# Patient Record
Sex: Female | Born: 1990 | Race: Black or African American | Hispanic: No | Marital: Single | Smoking: Never smoker
Health system: Southern US, Community
[De-identification: ages and names within clinical notes are randomized; demographics above are authoritative.]

## PROBLEM LIST (undated history)

## (undated) ENCOUNTER — Inpatient Hospital Stay (HOSPITAL_COMMUNITY): Payer: Self-pay

## (undated) DIAGNOSIS — G43909 Migraine, unspecified, not intractable, without status migrainosus: Secondary | ICD-10-CM

## (undated) DIAGNOSIS — R112 Nausea with vomiting, unspecified: Secondary | ICD-10-CM

## (undated) DIAGNOSIS — T8859XA Other complications of anesthesia, initial encounter: Secondary | ICD-10-CM

## (undated) DIAGNOSIS — D649 Anemia, unspecified: Secondary | ICD-10-CM

## (undated) DIAGNOSIS — N730 Acute parametritis and pelvic cellulitis: Secondary | ICD-10-CM

## (undated) DIAGNOSIS — Z9889 Other specified postprocedural states: Secondary | ICD-10-CM

## (undated) DIAGNOSIS — A749 Chlamydial infection, unspecified: Secondary | ICD-10-CM

## (undated) DIAGNOSIS — T4145XA Adverse effect of unspecified anesthetic, initial encounter: Secondary | ICD-10-CM

## (undated) DIAGNOSIS — F329 Major depressive disorder, single episode, unspecified: Secondary | ICD-10-CM

## (undated) DIAGNOSIS — L409 Psoriasis, unspecified: Secondary | ICD-10-CM

## (undated) DIAGNOSIS — F32A Depression, unspecified: Secondary | ICD-10-CM

## (undated) DIAGNOSIS — B999 Unspecified infectious disease: Secondary | ICD-10-CM

## (undated) DIAGNOSIS — Z800 Family history of malignant neoplasm of digestive organs: Secondary | ICD-10-CM

## (undated) DIAGNOSIS — N200 Calculus of kidney: Secondary | ICD-10-CM

## (undated) DIAGNOSIS — G8929 Other chronic pain: Secondary | ICD-10-CM

## (undated) DIAGNOSIS — F419 Anxiety disorder, unspecified: Secondary | ICD-10-CM

## (undated) DIAGNOSIS — Z803 Family history of malignant neoplasm of breast: Secondary | ICD-10-CM

## (undated) DIAGNOSIS — R569 Unspecified convulsions: Secondary | ICD-10-CM

## (undated) HISTORY — PX: WISDOM TOOTH EXTRACTION: SHX21

## (undated) HISTORY — DX: Unspecified convulsions: R56.9

## (undated) HISTORY — DX: Family history of malignant neoplasm of digestive organs: Z80.0

## (undated) HISTORY — DX: Major depressive disorder, single episode, unspecified: F32.9

## (undated) HISTORY — DX: Anemia, unspecified: D64.9

## (undated) HISTORY — DX: Depression, unspecified: F32.A

## (undated) HISTORY — PX: SKIN GRAFT: SHX250

## (undated) HISTORY — DX: Family history of malignant neoplasm of breast: Z80.3

---
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
## 2010-08-19 ENCOUNTER — Inpatient Hospital Stay (HOSPITAL_COMMUNITY)
Admission: AD | Admit: 2010-08-19 | Discharge: 2010-08-19 | Disposition: A | Discharge status: Home / Self Care | Payer: Medicaid Other | Source: Ambulatory Visit | Attending: Obstetrics | Admitting: Obstetrics

## 2010-08-19 DIAGNOSIS — O2390 Unspecified genitourinary tract infection in pregnancy, unspecified trimester: Secondary | ICD-10-CM

## 2010-08-19 DIAGNOSIS — R109 Unspecified abdominal pain: Secondary | ICD-10-CM

## 2010-08-19 DIAGNOSIS — N390 Urinary tract infection, site not specified: Secondary | ICD-10-CM

## 2010-08-19 LAB — URINALYSIS, ROUTINE W REFLEX MICROSCOPIC
Bilirubin Urine: NEGATIVE
Hgb urine dipstick: NEGATIVE
Ketones, ur: NEGATIVE mg/dL
Nitrite: NEGATIVE
Protein, ur: NEGATIVE mg/dL
Specific Gravity, Urine: 1.025 (ref 1.005–1.030)
Urine Glucose, Fasting: NEGATIVE mg/dL
Urobilinogen, UA: 0.2 mg/dL (ref 0.0–1.0)
pH: 6.5 (ref 5.0–8.0)

## 2010-08-19 LAB — WET PREP, GENITAL
Trich, Wet Prep: NONE SEEN
Yeast Wet Prep HPF POC: NONE SEEN

## 2010-08-19 LAB — URINE MICROSCOPIC-ADD ON

## 2010-08-20 LAB — URINE CULTURE
Colony Count: NO GROWTH
Culture  Setup Time: 201202090154
Culture: NO GROWTH

## 2010-08-20 LAB — GC/CHLAMYDIA PROBE AMP, GENITAL
Chlamydia, DNA Probe: NEGATIVE
GC Probe Amp, Genital: NEGATIVE

## 2010-10-05 LAB — WET PREP, GENITAL: Yeast Wet Prep HPF POC: NONE SEEN

## 2010-10-05 LAB — URINE MICROSCOPIC-ADD ON

## 2010-10-05 LAB — GC/CHLAMYDIA PROBE AMP, GENITAL
Chlamydia, DNA Probe: NEGATIVE
GC Probe Amp, Genital: NEGATIVE

## 2010-10-05 LAB — URINALYSIS, ROUTINE W REFLEX MICROSCOPIC
Bilirubin Urine: NEGATIVE
Glucose, UA: NEGATIVE mg/dL
Hgb urine dipstick: NEGATIVE
Ketones, ur: NEGATIVE mg/dL
Nitrite: NEGATIVE
Protein, ur: NEGATIVE mg/dL
Specific Gravity, Urine: 1.025 (ref 1.005–1.030)
Urobilinogen, UA: 0.2 mg/dL (ref 0.0–1.0)
pH: 6 (ref 5.0–8.0)

## 2010-10-05 LAB — POCT PREGNANCY, URINE: Preg Test, Ur: NEGATIVE

## 2010-10-19 ENCOUNTER — Inpatient Hospital Stay (HOSPITAL_COMMUNITY)
Admission: AD | Admit: 2010-10-19 | Discharge: 2010-10-19 | Disposition: A | Discharge status: Home / Self Care | Payer: Medicaid Other | Source: Ambulatory Visit | Attending: Obstetrics | Admitting: Obstetrics

## 2010-10-19 DIAGNOSIS — N949 Unspecified condition associated with female genital organs and menstrual cycle: Secondary | ICD-10-CM

## 2010-10-19 DIAGNOSIS — O99891 Other specified diseases and conditions complicating pregnancy: Secondary | ICD-10-CM | POA: Insufficient documentation

## 2010-10-19 DIAGNOSIS — R109 Unspecified abdominal pain: Secondary | ICD-10-CM | POA: Insufficient documentation

## 2010-10-19 LAB — URINALYSIS, ROUTINE W REFLEX MICROSCOPIC
Bilirubin Urine: NEGATIVE
Glucose, UA: NEGATIVE mg/dL
Hgb urine dipstick: NEGATIVE
Ketones, ur: NEGATIVE mg/dL
Nitrite: NEGATIVE
Protein, ur: NEGATIVE mg/dL
Specific Gravity, Urine: 1.02 (ref 1.005–1.030)
Urobilinogen, UA: 0.2 mg/dL (ref 0.0–1.0)
pH: 7.5 (ref 5.0–8.0)

## 2011-06-05 ENCOUNTER — Emergency Department (HOSPITAL_COMMUNITY)
Admission: EM | Admit: 2011-06-05 | Discharge: 2011-06-05 | Disposition: A | Discharge status: Home / Self Care | Payer: Self-pay | Attending: Emergency Medicine | Admitting: Emergency Medicine

## 2011-06-05 ENCOUNTER — Encounter: Payer: Self-pay | Admitting: Nurse Practitioner

## 2011-06-05 DIAGNOSIS — K089 Disorder of teeth and supporting structures, unspecified: Secondary | ICD-10-CM | POA: Insufficient documentation

## 2011-06-05 DIAGNOSIS — K0889 Other specified disorders of teeth and supporting structures: Secondary | ICD-10-CM

## 2011-06-05 DIAGNOSIS — K029 Dental caries, unspecified: Secondary | ICD-10-CM | POA: Insufficient documentation

## 2011-06-05 MED ORDER — PENICILLIN V POTASSIUM 250 MG PO TABS: 250.0000 mg | ORAL_TABLET | Freq: Four times a day (QID) | ORAL | Status: AC

## 2011-06-05 MED ORDER — HYDROCODONE-ACETAMINOPHEN 5-325 MG PO TABS
ORAL_TABLET | ORAL | Status: AC
  Filled 2011-06-05: qty 1

## 2011-06-05 MED ORDER — HYDROCODONE-ACETAMINOPHEN 5-325 MG PO TABS: ORAL_TABLET | ORAL | Status: AC

## 2011-06-05 MED ORDER — HYDROCODONE-ACETAMINOPHEN 5-325 MG PO TABS
1.0000 | ORAL_TABLET | Freq: Once | ORAL | Status: AC
  Administered 2011-06-05: 1 via ORAL

## 2011-06-05 NOTE — ED Notes (Signed)
C/o all 4 wisdom teeth hurting over past weeks but increased in severity 2 days ago.

## 2011-06-05 NOTE — ED Provider Notes (Signed)
History     CSN: [id number] Arrival date & time: 06/05/2011  4:14 PM   Chief Complaint  Patient presents with  . Dental Pain     HPI Pt was seen at 1830.  Per pt, c/o gradual onset and persistence of constant bilat upper and lower teeth "pain" for the past several weeks, worse over the past 2 days.  Denies fevers, no intra-oral edema, no rash, no facial swelling, no dysphagia, no neck pain.   The condition is aggravated by nothing. The condition is relieved by nothing. The symptoms have been associated with no other complaints. The patient has no significant history of serious medical conditions.    History reviewed. No pertinent past medical history.  History reviewed. No pertinent past surgical history.   History  Substance Use Topics  . Smoking status: Never Smoker   . Smokeless tobacco: Not on file  . Alcohol Use: No    Review of Systems ROS: Statement: All systems negative except as marked or noted in the HPI; Constitutional: Negative for fever and chills. ; ; Eyes: Negative for eye pain and discharge. ; ; ENMT: Positive for dental caries, dental hygiene poor and toothache. Negative for ear pain, bleeding gums, dental injury, facial deformity, facial swelling, hoarseness, nasal congestion, sinus pressure, sore throat, throat swelling and tongue swollen. ; ; Cardiovascular: Negative for chest pain, palpitations, diaphoresis, dyspnea and peripheral edema. ; ; Respiratory: Negative for cough, wheezing and stridor. ; ; Gastrointestinal: Negative for nausea, vomiting, diarrhea and abdominal pain. ; ; Genitourinary: Negative for dysuria, flank pain and hematuria. ; ; Musculoskeletal: Negative for back pain and neck pain. ; ; Skin: Negative for rash and skin lesion. ; ; Neuro: Negative for headache, lightheadedness and neck stiffness. ;    Allergies  Review of patient's allergies indicates no known allergies.  Home Medications  No current outpatient prescriptions on file.  BP  123/76  Pulse 83  Temp 97.6 F (36.4 C)  Resp 16  Ht 5\' 6"  (1.676 m)  Wt 210 lb (95.255 kg)  BMI 33.89 kg/m2  SpO2 99%  Physical Exam 1835: Physical examination: Vital signs and O2 SAT: Reviewed; Constitutional: Well developed, Well nourished, Well hydrated, In no acute distress; Head and Face: Normocephalic, Atraumatic; Eyes: EOMI, PERRL, No scleral icterus; ENMT: Mouth and pharynx normal, Poor dentition, Widespread dental decay, Left TM normal, Right TM normal, Mucous membranes moist, +bilat upper and lower wisdom teeth not erupted but gums tender to percuss.  No gingival erythema, edema, fluctuance, or drainage.  No hoarse voice, no drooling, no stridor.  ; Neck: Supple, Full range of motion, No lymphadenopathy; Cardiovascular: Regular rate and rhythm, No murmur, rub, or gallop; Respiratory: Breath sounds clear & equal bilaterally, No rales, rhonchi, wheezes, or rub, Normal respiratory effort/excursion; Chest: Nontender, Movement normal; Extremities: Pulses normal, No tenderness, No edema; Neuro: AA&Ox3, Major CN grossly intact.  No gross focal motor or sensory deficits in extremities.; Skin: Color normal, No rash, No petechiae, Warm, Dry   ED Course  Procedures   MDM  MDM Reviewed: nursing note and vitals        Anyeli Hockenbury Allison Quarry, DO 06/07/11 [phone]

## 2011-12-22 LAB — OB RESULTS CONSOLE GC/CHLAMYDIA
Chlamydia: NEGATIVE
Gonorrhea: NEGATIVE

## 2011-12-22 LAB — OB RESULTS CONSOLE HGB/HCT, BLOOD
HCT: 34 %
Hemoglobin: 11.7 g/dL

## 2012-01-24 LAB — GLUCOSE TOLERANCE, 1 HOUR (50G) W/O FASTING: Glucose, 1 Hour GTT: 74

## 2012-01-24 LAB — OB RESULTS CONSOLE VARICELLA ZOSTER ANTIBODY, IGG: Varicella: IMMUNE

## 2012-01-24 LAB — OB RESULTS CONSOLE RUBELLA ANTIBODY, IGM: Rubella: IMMUNE

## 2012-01-24 LAB — OB RESULTS CONSOLE HEPATITIS B SURFACE ANTIGEN: Hepatitis B Surface Ag: NEGATIVE

## 2012-01-26 LAB — HEMOGLOBINOPATHY EVALUATION: Hemoglobin, Elp: NORMAL

## 2012-02-03 ENCOUNTER — Ambulatory Visit (INDEPENDENT_AMBULATORY_CARE_PROVIDER_SITE_OTHER): Payer: Medicaid Other | Admitting: Obstetrics & Gynecology

## 2012-02-03 ENCOUNTER — Encounter: Payer: Self-pay | Admitting: Obstetrics & Gynecology

## 2012-02-03 VITALS — BP 107/68 | Temp 97.8°F | Wt 230.7 lb

## 2012-02-03 DIAGNOSIS — O26899 Other specified pregnancy related conditions, unspecified trimester: Secondary | ICD-10-CM

## 2012-02-03 DIAGNOSIS — F489 Nonpsychotic mental disorder, unspecified: Secondary | ICD-10-CM

## 2012-02-03 DIAGNOSIS — F53 Postpartum depression: Secondary | ICD-10-CM | POA: Insufficient documentation

## 2012-02-03 DIAGNOSIS — O0990 Supervision of high risk pregnancy, unspecified, unspecified trimester: Secondary | ICD-10-CM | POA: Insufficient documentation

## 2012-02-03 DIAGNOSIS — G40909 Epilepsy, unspecified, not intractable, without status epilepticus: Secondary | ICD-10-CM | POA: Insufficient documentation

## 2012-02-03 DIAGNOSIS — O9989 Other specified diseases and conditions complicating pregnancy, childbirth and the puerperium: Secondary | ICD-10-CM

## 2012-02-03 DIAGNOSIS — Z803 Family history of malignant neoplasm of breast: Secondary | ICD-10-CM | POA: Insufficient documentation

## 2012-02-03 DIAGNOSIS — Z9889 Other specified postprocedural states: Secondary | ICD-10-CM

## 2012-02-03 DIAGNOSIS — O99350 Diseases of the nervous system complicating pregnancy, unspecified trimester: Secondary | ICD-10-CM

## 2012-02-03 DIAGNOSIS — Z98891 History of uterine scar from previous surgery: Secondary | ICD-10-CM

## 2012-02-03 DIAGNOSIS — N898 Other specified noninflammatory disorders of vagina: Secondary | ICD-10-CM

## 2012-02-03 DIAGNOSIS — D649 Anemia, unspecified: Secondary | ICD-10-CM

## 2012-02-03 DIAGNOSIS — G43909 Migraine, unspecified, not intractable, without status migrainosus: Secondary | ICD-10-CM

## 2012-02-03 DIAGNOSIS — O99340 Other mental disorders complicating pregnancy, unspecified trimester: Secondary | ICD-10-CM

## 2012-02-03 DIAGNOSIS — F329 Major depressive disorder, single episode, unspecified: Secondary | ICD-10-CM

## 2012-02-03 LAB — POCT URINALYSIS DIP (DEVICE)
Bilirubin Urine: NEGATIVE
Glucose, UA: NEGATIVE mg/dL
Hgb urine dipstick: NEGATIVE
Ketones, ur: NEGATIVE mg/dL
Leukocytes, UA: NEGATIVE
Nitrite: NEGATIVE
Protein, ur: 30 mg/dL — AB
Specific Gravity, Urine: 1.02 (ref 1.005–1.030)
Urobilinogen, UA: 1 mg/dL (ref 0.0–1.0)
pH: 7 (ref 5.0–8.0)

## 2012-02-03 NOTE — Progress Notes (Signed)
Nutrition Note: (1st visit consult) Pt seen at [facility] for anemia, depression, seizure disorder and obesity. Pt reports good intake of 1-2 meals and several snacks daily. No food allergies. Pt reports vomiting 1 x qd when taking PNV.  Gets a wide variety of foods, limited vegetable intake. Does get caffeine daily. Disc wt gain goals of 11-20#. Current wt gain of 6.7# is within normal limits @ [redacted]w[redacted]d gestation. Pt has a WIC appt today at 3:00 to apply. Pt does plan to breastfeed. Follow up if referred.  Cy Blamer, RD
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
## 2012-02-03 NOTE — Patient Instructions (Addendum)
Pregnancy - Second Trimester The second trimester of pregnancy (3 to 6 months) is a period of rapid growth for you and your baby. At the end of the sixth month, your baby is about 9 inches long and weighs 1 1/2 pounds. You will begin to feel the baby move between 18 and 20 weeks of the pregnancy. This is called quickening. Weight gain is faster. A clear fluid (colostrum) may leak out of your breasts. You may feel small contractions of the womb (uterus). This is known as false labor or Braxton-Hicks contractions. This is like a practice for labor when the baby is ready to be born. Usually, the problems with morning sickness have usually passed by the end of your first trimester. Some women develop small dark blotches (called cholasma, mask of pregnancy) on their face that usually goes away after the baby is born. Exposure to the sun makes the blotches worse. Acne may also develop in some pregnant women and pregnant women who have acne, may find that it goes away. PRENATAL EXAMS  Blood work may continue to be done during prenatal exams. These tests are done to check on your health and the probable health of your baby. Blood work is used to follow your blood levels (hemoglobin). Anemia (low hemoglobin) is common during pregnancy. Iron and vitamins are given to help prevent this. You will also be checked for diabetes between 24 and 28 weeks of the pregnancy. Some of the previous blood tests may be repeated.   The size of the uterus is measured during each visit. This is to make sure that the baby is continuing to grow properly according to the dates of the pregnancy.   Your blood pressure is checked every prenatal visit. This is to make sure you are not getting toxemia.   Your urine is checked to make sure you do not have an infection, diabetes or protein in the urine.   Your weight is checked often to make sure gains are happening at the suggested rate. This is to ensure that both you and your baby are  growing normally.   Sometimes, an ultrasound is performed to confirm the proper growth and development of the baby. This is a test which bounces harmless sound waves off the baby so your caregiver can more accurately determine due dates.  Sometimes, a specialized test is done on the amniotic fluid surrounding the baby. This test is called an amniocentesis. The amniotic fluid is obtained by sticking a needle into the belly (abdomen). This is done to check the chromosomes in instances where there is a concern about possible genetic problems with the baby. It is also sometimes done near the end of pregnancy if an early delivery is required. In this case, it is done to help make sure the baby's lungs are mature enough for the baby to live outside of the womb. CHANGES OCCURING IN THE SECOND TRIMESTER OF PREGNANCY Your body goes through many changes during pregnancy. They vary from person to person. Talk to your caregiver about changes you notice that you are concerned about.  During the second trimester, you will likely have an increase in your appetite. It is normal to have cravings for certain foods. This varies from person to person and pregnancy to pregnancy.   Your lower abdomen will begin to bulge.   You may have to urinate more often because the uterus and baby are pressing on your bladder. It is also common to get more bladder infections during pregnancy (  pain with urination). You can help this by drinking lots of fluids and emptying your bladder before and after intercourse.   You may begin to get stretch marks on your hips, abdomen, and breasts. These are normal changes in the body during pregnancy. There are no exercises or medications to take that prevent this change.   You may begin to develop swollen and bulging veins (varicose veins) in your legs. Wearing support hose, elevating your feet for 15 minutes, 3 to 4 times a day and limiting salt in your diet helps lessen the problem.    Heartburn may develop as the uterus grows and pushes up against the stomach. Antacids recommended by your caregiver helps with this problem. Also, eating smaller meals 4 to 5 times a day helps.   Constipation can be treated with a stool softener or adding bulk to your diet. Drinking lots of fluids, vegetables, fruits, and whole grains are helpful.   Exercising is also helpful. If you have been very active up until your pregnancy, most of these activities can be continued during your pregnancy. If you have been less active, it is helpful to start an exercise program such as walking.   Hemorrhoids (varicose veins in the rectum) may develop at the end of the second trimester. Warm sitz baths and hemorrhoid cream recommended by your caregiver helps hemorrhoid problems.   Backaches may develop during this time of your pregnancy. Avoid heavy lifting, wear low heal shoes and practice good posture to help with backache problems.   Some pregnant women develop tingling and numbness of their hand and fingers because of swelling and tightening of ligaments in the wrist (carpel tunnel syndrome). This goes away after the baby is born.   As your breasts enlarge, you may have to get a bigger bra. Get a comfortable, cotton, support bra. Do not get a nursing bra until the last month of the pregnancy if you will be nursing the baby.   You may get a dark line from your belly button to the pubic area called the linea nigra.   You may develop rosy cheeks because of increase blood flow to the face.   You may develop spider looking lines of the face, neck, arms and chest. These go away after the baby is born.  HOME CARE INSTRUCTIONS   It is extremely important to avoid all smoking, herbs, alcohol, and unprescribed drugs during your pregnancy. These chemicals affect the formation and growth of the baby. Avoid these chemicals throughout the pregnancy to ensure the delivery of a healthy infant.   Most of your home  care instructions are the same as suggested for the first trimester of your pregnancy. Keep your caregiver's appointments. Follow your caregiver's instructions regarding medication use, exercise and diet.   During pregnancy, you are providing food for you and your baby. Continue to eat regular, well-balanced meals. Choose foods such as meat, fish, milk and other low fat dairy products, vegetables, fruits, and whole-grain breads and cereals. Your caregiver will tell you of the ideal weight gain.   A physical sexual relationship may be continued up until near the end of pregnancy if there are no other problems. Problems could include early (premature) leaking of amniotic fluid from the membranes, vaginal bleeding, abdominal pain, or other medical or pregnancy problems.   Exercise regularly if there are no restrictions. Check with your caregiver if you are unsure of the safety of some of your exercises. The greatest weight gain will occur in the   last 2 trimesters of pregnancy. Exercise will help you:   Control your weight.   Get you in shape for labor and delivery.   Lose weight after you have the baby.   Wear a good support or jogging bra for breast tenderness during pregnancy. This may help if worn during sleep. Pads or tissues may be used in the bra if you are leaking colostrum.   Do not use hot tubs, steam rooms or saunas throughout the pregnancy.   Wear your seat belt at all times when driving. This protects you and your baby if you are in an accident.   Avoid raw meat, uncooked cheese, cat litter boxes and soil used by cats. These carry germs that can cause birth defects in the baby.   The second trimester is also a good time to visit your dentist for your dental health if this has not been done yet. Getting your teeth cleaned is OK. Use a soft toothbrush. Brush gently during pregnancy.   It is easier to loose urine during pregnancy. Tightening up and strengthening the pelvic muscles will  help with this problem. Practice stopping your urination while you are going to the bathroom. These are the same muscles you need to strengthen. It is also the muscles you would use as if you were trying to stop from passing gas. You can practice tightening these muscles up 10 times a set and repeating this about 3 times per day. Once you know what muscles to tighten up, do not perform these exercises during urination. It is more likely to contribute to an infection by backing up the urine.   Ask for help if you have financial, counseling or nutritional needs during pregnancy. Your caregiver will be able to offer counseling for these needs as well as refer you for other special needs.   Your skin may become oily. If so, wash your face with mild soap, use non-greasy moisturizer and oil or cream based makeup.  MEDICATIONS AND DRUG USE IN PREGNANCY  Take prenatal vitamins as directed. The vitamin should contain 1 milligram of folic acid. Keep all vitamins out of reach of children. Only a couple vitamins or tablets containing iron may be fatal to a baby or young child when ingested.   Avoid use of all medications, including herbs, over-the-counter medications, not prescribed or suggested by your caregiver. Only take over-the-counter or prescription medicines for pain, discomfort, or fever as directed by your caregiver. Do not use aspirin.   Let your caregiver also know about herbs you may be using.   Alcohol is related to a number of birth defects. This includes fetal alcohol syndrome. All alcohol, in any form, should be avoided completely. Smoking will cause low birth rate and premature babies.   Street or illegal drugs are very harmful to the baby. They are absolutely forbidden. A baby born to an addicted mother will be addicted at birth. The baby will go through the same withdrawal an adult does.  SEEK MEDICAL CARE IF:  You have any concerns or worries during your pregnancy. It is better to call with  your questions if you feel they cannot wait, rather than worry about them. SEEK IMMEDIATE MEDICAL CARE IF:   An unexplained oral temperature above 102 F (38.9 C) develops, or as your caregiver suggests.   You have leaking of fluid from the vagina (birth canal). If leaking membranes are suspected, take your temperature and tell your caregiver of this when you call.   There   is vaginal spotting, bleeding, or passing clots. Tell your caregiver of the amount and how many pads are used. Light spotting in pregnancy is common, especially following intercourse.   You develop a bad smelling vaginal discharge with a change in the color from clear to white.   You continue to feel sick to your stomach (nauseated) and have no relief from remedies suggested. You vomit blood or coffee ground-like materials.   You lose more than 2 pounds of weight or gain more than 2 pounds of weight over 1 week, or as suggested by your caregiver.   You notice swelling of your face, hands, feet, or legs.   You get exposed to [place] measles and have never had them.   You are exposed to fifth disease or chickenpox.   You develop belly (abdominal) pain. Round ligament discomfort is a common non-cancerous (benign) cause of abdominal pain in pregnancy. Your caregiver still must evaluate you.   You develop a bad headache that does not go away.   You develop fever, diarrhea, pain with urination, or shortness of breath.   You develop visual problems, blurry, or double vision.   You fall or are in a car accident or any kind of trauma.   There is mental or physical violence at home.  Document Released: 06/22/2001 Document Revised: 06/17/2011 Document Reviewed: 12/25/2008 [facility] Patient Information 2012 [place], [place]. Postpartum Depression and Baby Blues The postpartum period begins right after the birth of a baby. During this time, there is often a great amount of joy and excitement. It is also a time of considerable  changes in the life of the parent(s). Regardless of how many times a mother gives birth, each child brings new challenges and dynamics to the family. It is not unusual to have feelings of excitement accompanied by confusing shifts in moods, emotions, and thoughts. All mothers are at risk of developing postpartum depression or the "baby blues." These mood changes can occur right after giving birth, or they may occur many months after giving birth. The baby blues or postpartum depression can be mild or severe. Additionally, postpartum depression can resolve rather quickly, or it can be a long-term condition. CAUSES Elevated hormones and their rapid decline are thought to be a main cause of postpartum depression and the baby blues. There are a number of hormones that radically change during and after pregnancy. Estrogen and progesterone usually decrease immediately after delivering your baby. The level of thyroid hormone and various cortisol steroids also rapidly drop. Other factors that play a major role in these changes include major life events and genetics.  RISK FACTORS If you have any of the following risks for the baby blues or postpartum depression, know what symptoms to watch out for during the postpartum period. Risk factors that may increase the likelihood of getting the baby blues or postpartum depression include:  Havinga personal or family history of depression.   Having depression while being pregnant.   Having premenstrual or oral contraceptive-associated mood issues.   Having exceptional life stress.   Having marital conflict.   Lacking a social support network.   Having a baby with special needs.   Having health problems such as diabetes.  SYMPTOMS Baby blues symptoms include:  Brief fluctuations in mood, such as going from extreme happiness to sadness.   Decreased concentration.   Difficulty sleeping.   Crying spells, tearfulness.   Irritability.   Anxiety.    Postpartum depression symptoms typically begin within the first month after  giving birth. These symptoms include:  Difficulty sleeping or excessive sleepiness.   Marked weight loss.   Agitation.   Feelings of worthlessness.   Lack of interest in activity or food.  Postpartum psychosis is a very concerning condition and can be dangerous. Fortunately, it is rare. Displaying any of the following symptoms is cause for immediate medical attention. Postpartum psychosis symptoms include:  Hallucinations and delusions.   Bizarre or disorganized behavior.   Confusion or disorientation.  DIAGNOSIS  A diagnosis is made by an evaluation of your symptoms. There are no medical or lab tests that lead to a diagnosis, but there are various questionnaires that a caregiver may use to identify those with the baby blues, postpartum depression, or psychosis. Often times, a screening tool called the [place] Postnatal Depression Scale is used to diagnose depression in the postpartum period.  TREATMENT The baby blues usually goes away on its own in 1 to 2 weeks. Social support is often all that is needed. You should be encouraged to get adequate sleep and rest. Occasionally, you may be given medicines to help you sleep.  Postpartum depression requires treatment as it can last several months or longer if it is not treated. Treatment may include individual or group therapy, medicine, or both to address any social, physiological, and psychological factors that may play a role in the depression. Regular exercise, a healthy diet, rest, and social support may also be strongly recommended.  Postpartum psychosis is more serious and needs treatment right away. Hospitalization is often needed. HOME CARE INSTRUCTIONS  Get as much rest as you can. Nap when the baby sleeps.   Exercise regularly. Some women find yoga and walking to be beneficial.   Eat a balanced and nourishing diet.   Do little things that you enjoy.  Have a cup of tea, take a bubble bath, read your favorite magazine, or listen to your favorite music.   Avoid alcohol.   Ask for help with household chores, cooking, grocery shopping, or running errands as needed. Do not try to do everything.   Talk to people close to you about how you are feeling. Get support from your partner, family members, friends, or other new moms.   Try to stay positive in how you think. Think about the things you are grateful for.   Do not spend a lot of time alone.   Only take medicine as directed by your caregiver.   Keep all your postpartum appointments.   Let your caregiver know if you have any concerns.  SEEK MEDICAL CARE IF: You are having a reaction or problems with your medicine. SEEK IMMEDIATE MEDICAL CARE IF:  You have suicidal feelings.   You feel you may harm the baby or someone else.  Document Released: 04/01/2004 Document Revised: 06/17/2011 Document Reviewed: 05/04/2011 [facility] Patient Information 2012 [place], [place]. Breastfeeding BENEFITS OF BREASTFEEDING For the baby  The first milk (colostrum) helps the baby's digestive system function better.   There are antibodies from the mother in the milk that help the baby fight off infections.   The baby has a lower incidence of asthma, allergies, and SIDS (sudden infant death syndrome).   The nutrients in breast milk are better than formulas for the baby and helps the baby's brain grow better.   Babies who breastfeed have less gas, colic, and constipation.  For the mother  Breastfeeding helps develop a very special bond between mother and baby.   It is more convenient, always available at the  correct temperature and cheaper than formula feeding.   It burns calories in the mother and helps with losing weight that was gained during pregnancy.   It makes the uterus contract back down to normal size faster and slows bleeding following delivery.   Breastfeeding mothers have a lower  risk of developing breast cancer.  NURSE FREQUENTLY  A healthy, full-term baby may breastfeed as often as every hour or space his or her feedings to every 3 hours.   How often to nurse will vary from baby to baby. Watch your baby for signs of hunger, not the clock.   Nurse as often as the baby requests, or when you feel the need to reduce the fullness of your breasts.   Awaken the baby if it has been 3 to 4 hours since the last feeding.   Frequent feeding will help the mother make more milk and will prevent problems like sore nipples and engorgement of the breasts.  BABY'S POSITION AT THE BREAST  Whether lying down or sitting, be sure that the baby's tummy is facing your tummy.   Support the breast with 4 fingers underneath the breast and the thumb above. Make sure your fingers are well away from the nipple and baby's mouth.   Stroke the baby's lips and cheek closest to the breast gently with your finger or nipple.   When the baby's mouth is open wide enough, place all of your nipple and as much of the dark area around the nipple as possible into your baby's mouth.   Pull the baby in close so the tip of the nose and the baby's cheeks touch the breast during the feeding.  FEEDINGS  The length of each feeding varies from baby to baby and from feeding to feeding.   The baby must suck about 2 to 3 minutes for your milk to get to him or her. This is called a "let down." For this reason, allow the baby to feed on each breast as long as he or she wants. Your baby will end the feeding when he or she has received the right balance of nutrients.   To break the suction, put your finger into the corner of the baby's mouth and slide it between his or her gums before removing your breast from his or her mouth. This will help prevent sore nipples.  REDUCING BREAST ENGORGEMENT  In the first week after your baby is born, you may experience signs of breast engorgement. When breasts are engorged, they  feel heavy, warm, full, and may be tender to the touch. You can reduce engorgement if you:   Nurse frequently, every 2 to 3 hours. Mothers who breastfeed early and often have fewer problems with engorgement.   Place light ice packs on your breasts between feedings. This reduces swelling. Wrap the ice packs in a lightweight towel to protect your skin.   Apply moist hot packs to your breast for 5 to 10 minutes before each feeding. This increases circulation and helps the milk flow.   Gently massage your breast before and during the feeding.   Make sure that the baby empties at least one breast at every feeding before switching sides.   Use a breast pump to empty the breasts if your baby is sleepy or not nursing well. You may also want to pump if you are returning to work or or you feel you are getting engorged.   Avoid bottle feeds, pacifiers or supplemental feedings of water or  juice in place of breastfeeding.   Be sure the baby is latched on and positioned properly while breastfeeding.   Prevent fatigue, stress, and anemia.   Wear a supportive bra, avoiding underwire styles.   Eat a balanced diet with enough fluids.  If you follow these suggestions, your engorgement should improve in 24 to 48 hours. If you are still experiencing difficulty, call your lactation consultant or caregiver. IS MY BABY GETTING ENOUGH MILK? Sometimes, mothers worry about whether their babies are getting enough milk. You can be assured that your baby is getting enough milk if:  The baby is actively sucking and you hear swallowing.   The baby nurses at least 8 to 12 times in a 24 hour time period. Nurse your baby until he or she unlatches or falls asleep at the first breast (at least 10 to 20 minutes), then offer the second side.   The baby is wetting 5 to 6 disposable diapers (6 to 8 cloth diapers) in a 24 hour period by 54 to 81 days of age.   The baby is having at least 2 to 3 stools every 24 hours for the  first few months. Breast milk is all the food your baby needs. It is not necessary for your baby to have water or formula. In fact, to help your breasts make more milk, it is best not to give your baby supplemental feedings during the early weeks.   The stool should be soft and yellow.   The baby should gain 4 to 7 ounces per week after he is 75 days old.  TAKE CARE OF YOURSELF Take care of your breasts by:  Bathing or showering daily.   Avoiding the use of soaps on your nipples.   Start feedings on your left breast at one feeding and on your right breast at the next feeding.   You will notice an increase in your milk supply 2 to 5 days after delivery. You may feel some discomfort from engorgement, which makes your breasts very firm and often tender. Engorgement "peaks" out within 24 to 48 hours. In the meantime, apply warm moist towels to your breasts for 5 to 10 minutes before feeding. Gentle massage and expression of some milk before feeding will soften your breasts, making it easier for your baby to latch on. Wear a well fitting nursing bra and air dry your nipples for 10 to 15 minutes after each feeding.   Only use cotton bra pads.   Only use pure lanolin on your nipples after nursing. You do not need to wash it off before nursing.  Take care of yourself by:   Eating well-balanced meals and nutritious snacks.   Drinking milk, fruit juice, and water to satisfy your thirst (about 8 glasses a day).   Getting plenty of rest.   Increasing calcium in your diet (1200 mg a day).   Avoiding foods that you notice affect the baby in a bad way.  SEEK MEDICAL CARE IF:   You have any questions or difficulty with breastfeeding.   You need help.   You have a hard, red, sore area on your breast, accompanied by a fever of 100.5 F (38.1 C) or more.   Your baby is too sleepy to eat well or is having trouble sleeping.   Your baby is wetting less than 6 diapers per day, by 72 days of age.    Your baby's skin or white part of his or her eyes is more yellow  than it was in the hospital.   You feel depressed.  Document Released: 06/28/2005 Document Revised: 06/17/2011 Document Reviewed: 02/10/2009 [facility] Patient Information 2012 [place], [place]. Pregnancy - Second Trimester The second trimester of pregnancy (3 to 6 months) is a period of rapid growth for you and your baby. At the end of the sixth month, your baby is about 9 inches long and weighs 1 1/2 pounds. You will begin to feel the baby move between 18 and 20 weeks of the pregnancy. This is called quickening. Weight gain is faster. A clear fluid (colostrum) may leak out of your breasts. You may feel small contractions of the womb (uterus). This is known as false labor or Braxton-Hicks contractions. This is like a practice for labor when the baby is ready to be born. Usually, the problems with morning sickness have usually passed by the end of your first trimester. Some women develop small dark blotches (called cholasma, mask of pregnancy) on their face that usually goes away after the baby is born. Exposure to the sun makes the blotches worse. Acne may also develop in some pregnant women and pregnant women who have acne, may find that it goes away. PRENATAL EXAMS  Blood work may continue to be done during prenatal exams. These tests are done to check on your health and the probable health of your baby. Blood work is used to follow your blood levels (hemoglobin). Anemia (low hemoglobin) is common during pregnancy. Iron and vitamins are given to help prevent this. You will also be checked for diabetes between 24 and 28 weeks of the pregnancy. Some of the previous blood tests may be repeated.   The size of the uterus is measured during each visit. This is to make sure that the baby is continuing to grow properly according to the dates of the pregnancy.   Your blood pressure is checked every prenatal visit. This is to make sure you are  not getting toxemia.   Your urine is checked to make sure you do not have an infection, diabetes or protein in the urine.   Your weight is checked often to make sure gains are happening at the suggested rate. This is to ensure that both you and your baby are growing normally.   Sometimes, an ultrasound is performed to confirm the proper growth and development of the baby. This is a test which bounces harmless sound waves off the baby so your caregiver can more accurately determine due dates.  Sometimes, a specialized test is done on the amniotic fluid surrounding the baby. This test is called an amniocentesis. The amniotic fluid is obtained by sticking a needle into the belly (abdomen). This is done to check the chromosomes in instances where there is a concern about possible genetic problems with the baby. It is also sometimes done near the end of pregnancy if an early delivery is required. In this case, it is done to help make sure the baby's lungs are mature enough for the baby to live outside of the womb. CHANGES OCCURING IN THE SECOND TRIMESTER OF PREGNANCY Your body goes through many changes during pregnancy. They vary from person to person. Talk to your caregiver about changes you notice that you are concerned about.  During the second trimester, you will likely have an increase in your appetite. It is normal to have cravings for certain foods. This varies from person to person and pregnancy to pregnancy.   Your lower abdomen will begin to bulge.   You  may have to urinate more often because the uterus and baby are pressing on your bladder. It is also common to get more bladder infections during pregnancy (pain with urination). You can help this by drinking lots of fluids and emptying your bladder before and after intercourse.   You may begin to get stretch marks on your hips, abdomen, and breasts. These are normal changes in the body during pregnancy. There are no exercises or medications to  take that prevent this change.   You may begin to develop swollen and bulging veins (varicose veins) in your legs. Wearing support hose, elevating your feet for 15 minutes, 3 to 4 times a day and limiting salt in your diet helps lessen the problem.   Heartburn may develop as the uterus grows and pushes up against the stomach. Antacids recommended by your caregiver helps with this problem. Also, eating smaller meals 4 to 5 times a day helps.   Constipation can be treated with a stool softener or adding bulk to your diet. Drinking lots of fluids, vegetables, fruits, and whole grains are helpful.   Exercising is also helpful. If you have been very active up until your pregnancy, most of these activities can be continued during your pregnancy. If you have been less active, it is helpful to start an exercise program such as walking.   Hemorrhoids (varicose veins in the rectum) may develop at the end of the second trimester. Warm sitz baths and hemorrhoid cream recommended by your caregiver helps hemorrhoid problems.   Backaches may develop during this time of your pregnancy. Avoid heavy lifting, wear low heal shoes and practice good posture to help with backache problems.   Some pregnant women develop tingling and numbness of their hand and fingers because of swelling and tightening of ligaments in the wrist (carpel tunnel syndrome). This goes away after the baby is born.   As your breasts enlarge, you may have to get a bigger bra. Get a comfortable, cotton, support bra. Do not get a nursing bra until the last month of the pregnancy if you will be nursing the baby.   You may get a dark line from your belly button to the pubic area called the linea nigra.   You may develop rosy cheeks because of increase blood flow to the face.   You may develop spider looking lines of the face, neck, arms and chest. These go away after the baby is born.  HOME CARE INSTRUCTIONS   It is extremely important to avoid  all smoking, herbs, alcohol, and unprescribed drugs during your pregnancy. These chemicals affect the formation and growth of the baby. Avoid these chemicals throughout the pregnancy to ensure the delivery of a healthy infant.   Most of your home care instructions are the same as suggested for the first trimester of your pregnancy. Keep your caregiver's appointments. Follow your caregiver's instructions regarding medication use, exercise and diet.   During pregnancy, you are providing food for you and your baby. Continue to eat regular, well-balanced meals. Choose foods such as meat, fish, milk and other low fat dairy products, vegetables, fruits, and whole-grain breads and cereals. Your caregiver will tell you of the ideal weight gain.   A physical sexual relationship may be continued up until near the end of pregnancy if there are no other problems. Problems could include early (premature) leaking of amniotic fluid from the membranes, vaginal bleeding, abdominal pain, or other medical or pregnancy problems.   Exercise regularly if there  are no restrictions. Check with your caregiver if you are unsure of the safety of some of your exercises. The greatest weight gain will occur in the last 2 trimesters of pregnancy. Exercise will help you:   Control your weight.   Get you in shape for labor and delivery.   Lose weight after you have the baby.   Wear a good support or jogging bra for breast tenderness during pregnancy. This may help if worn during sleep. Pads or tissues may be used in the bra if you are leaking colostrum.   Do not use hot tubs, steam rooms or saunas throughout the pregnancy.   Wear your seat belt at all times when driving. This protects you and your baby if you are in an accident.   Avoid raw meat, uncooked cheese, cat litter boxes and soil used by cats. These carry germs that can cause birth defects in the baby.   The second trimester is also a good time to visit your dentist  for your dental health if this has not been done yet. Getting your teeth cleaned is OK. Use a soft toothbrush. Brush gently during pregnancy.   It is easier to loose urine during pregnancy. Tightening up and strengthening the pelvic muscles will help with this problem. Practice stopping your urination while you are going to the bathroom. These are the same muscles you need to strengthen. It is also the muscles you would use as if you were trying to stop from passing gas. You can practice tightening these muscles up 10 times a set and repeating this about 3 times per day. Once you know what muscles to tighten up, do not perform these exercises during urination. It is more likely to contribute to an infection by backing up the urine.   Ask for help if you have financial, counseling or nutritional needs during pregnancy. Your caregiver will be able to offer counseling for these needs as well as refer you for other special needs.   Your skin may become oily. If so, wash your face with mild soap, use non-greasy moisturizer and oil or cream based makeup.  MEDICATIONS AND DRUG USE IN PREGNANCY  Take prenatal vitamins as directed. The vitamin should contain 1 milligram of folic acid. Keep all vitamins out of reach of children. Only a couple vitamins or tablets containing iron may be fatal to a baby or young child when ingested.   Avoid use of all medications, including herbs, over-the-counter medications, not prescribed or suggested by your caregiver. Only take over-the-counter or prescription medicines for pain, discomfort, or fever as directed by your caregiver. Do not use aspirin.   Let your caregiver also know about herbs you may be using.   Alcohol is related to a number of birth defects. This includes fetal alcohol syndrome. All alcohol, in any form, should be avoided completely. Smoking will cause low birth rate and premature babies.   Street or illegal drugs are very harmful to the baby. They are  absolutely forbidden. A baby born to an addicted mother will be addicted at birth. The baby will go through the same withdrawal an adult does.  SEEK MEDICAL CARE IF:  You have any concerns or worries during your pregnancy. It is better to call with your questions if you feel they cannot wait, rather than worry about them. SEEK IMMEDIATE MEDICAL CARE IF:   An unexplained oral temperature above 102 F (38.9 C) develops, or as your caregiver suggests.   You have leaking  of fluid from the vagina (birth canal). If leaking membranes are suspected, take your temperature and tell your caregiver of this when you call.   There is vaginal spotting, bleeding, or passing clots. Tell your caregiver of the amount and how many pads are used. Light spotting in pregnancy is common, especially following intercourse.   You develop a bad smelling vaginal discharge with a change in the color from clear to white.   You continue to feel sick to your stomach (nauseated) and have no relief from remedies suggested. You vomit blood or coffee ground-like materials.   You lose more than 2 pounds of weight or gain more than 2 pounds of weight over 1 week, or as suggested by your caregiver.   You notice swelling of your face, hands, feet, or legs.   You get exposed to [place] measles and have never had them.   You are exposed to fifth disease or chickenpox.   You develop belly (abdominal) pain. Round ligament discomfort is a common non-cancerous (benign) cause of abdominal pain in pregnancy. Your caregiver still must evaluate you.   You develop a bad headache that does not go away.   You develop fever, diarrhea, pain with urination, or shortness of breath.   You develop visual problems, blurry, or double vision.   You fall or are in a car accident or any kind of trauma.   There is mental or physical violence at home.  Document Released: 06/22/2001 Document Revised: 06/17/2011 Document Reviewed:  12/25/2008 [facility] Patient Information 2012 [place], [place]. Contraception Choices Contraception (birth control) is the use of any methods or devices to prevent pregnancy. Below are some methods to help avoid pregnancy. HORMONAL METHODS   Contraceptive implant. This is a thin, plastic tube containing progesterone hormone. It does not contain estrogen hormone. Your caregiver inserts the tube in the inner part of the upper arm. The tube can remain in place for up to 3 years. After 3 years, the implant must be removed. The implant prevents the ovaries from releasing an egg (ovulation), thickens the cervical mucus which prevents sperm from entering the uterus, and thins the lining of the inside of the uterus.   Progesterone-only injections. These injections are given every 3 months by your caregiver to prevent pregnancy. This synthetic progesterone hormone stops the ovaries from releasing eggs. It also thickens cervical mucus and changes the uterine lining. This makes it harder for sperm to survive in the uterus.   Birth control pills. These pills contain estrogen and progesterone hormone. They work by stopping the egg from forming in the ovary (ovulation). Birth control pills are prescribed by a caregiver.Birth control pills can also be used to treat heavy periods.   Minipill. This type of birth control pill contains only the progesterone hormone. They are taken every day of each month and must be prescribed by your caregiver.   Birth control patch. The patch contains hormones similar to those in birth control pills. It must be changed once a week and is prescribed by a caregiver.   Vaginal ring. The ring contains hormones similar to those in birth control pills. It is left in the vagina for 3 weeks, removed for 1 week, and then a new one is put back in place. The patient must be comfortable inserting and removing the ring from the vagina.A caregiver's prescription is necessary.   Emergency  contraception. Emergency contraceptives prevent pregnancy after unprotected sexual intercourse. This pill can be taken right after sex or up to  5 days after unprotected sex. It is most effective the sooner you take the pills after having sexual intercourse. Emergency contraceptive pills are available without a prescription. Check with your pharmacist. Do not use emergency contraception as your only form of birth control.  BARRIER METHODS   Female condom. This is a thin sheath (latex or rubber) that is worn over the penis during sexual intercourse. It can be used with spermicide to increase effectiveness.   Female condom. This is a soft, loose-fitting sheath that is put into the vagina before sexual intercourse.   Diaphragm. This is a soft, latex, dome-shaped barrier that must be fitted by a caregiver. It is inserted into the vagina, along with a spermicidal jelly. It is inserted before intercourse. The diaphragm should be left in the vagina for 6 to 8 hours after intercourse.   Cervical cap. This is a round, soft, latex or plastic cup that fits over the cervix and must be fitted by a caregiver. The cap can be left in place for up to 48 hours after intercourse.   Sponge. This is a soft, circular piece of polyurethane foam. The sponge has spermicide in it. It is inserted into the vagina after wetting it and before sexual intercourse.   Spermicides. These are chemicals that kill or block sperm from entering the cervix and uterus. They come in the form of creams, jellies, suppositories, foam, or tablets. They do not require a prescription. They are inserted into the vagina with an applicator before having sexual intercourse. The process must be repeated every time you have sexual intercourse.  INTRAUTERINE CONTRACEPTION  Intrauterine device (IUD). This is a T-shaped device that is put in a woman's uterus during a menstrual period to prevent pregnancy. There are 2 types:   Copper IUD. This type of IUD is  wrapped in copper wire and is placed inside the uterus. Copper makes the uterus and fallopian tubes produce a fluid that kills sperm. It can stay in place for 10 years.   Hormone IUD. This type of IUD contains the hormone progestin (synthetic progesterone). The hormone thickens the cervical mucus and prevents sperm from entering the uterus, and it also thins the uterine lining to prevent implantation of a fertilized egg. The hormone can weaken or kill the sperm that get into the uterus. It can stay in place for 5 years.  PERMANENT METHODS OF CONTRACEPTION  Female tubal ligation. This is when the woman's fallopian tubes are surgically sealed, tied, or blocked to prevent the egg from traveling to the uterus.   Female sterilization. This is when the female has the tubes that carry sperm tied off (vasectomy).This blocks sperm from entering the vagina during sexual intercourse. After the procedure, the man can still ejaculate fluid (semen).  NATURAL PLANNING METHODS  Natural family planning. This is not having sexual intercourse or using a barrier method (condom, diaphragm, cervical cap) on days the woman could become pregnant.   Calendar method. This is keeping track of the length of each menstrual cycle and identifying when you are fertile.   Ovulation method. This is avoiding sexual intercourse during ovulation.   Symptothermal method. This is avoiding sexual intercourse during ovulation, using a thermometer and ovulation symptoms.   Post-ovulation method. This is timing sexual intercourse after you have ovulated.  Regardless of which type or method of contraception you choose, it is important that you use condoms to protect against the transmission of sexually transmitted diseases (STDs). Talk with your caregiver about which form  of contraception is most appropriate for you. Document Released: 06/28/2005 Document Revised: 06/17/2011 Document Reviewed: 11/04/2010 [facility] Patient Information 2012  [place], [place]. Postpartum Depression and Baby Blues The postpartum period begins right after the birth of a baby. During this time, there is often a great amount of joy and excitement. It is also a time of considerable changes in the life of the parent(s). Regardless of how many times a mother gives birth, each child brings new challenges and dynamics to the family. It is not unusual to have feelings of excitement accompanied by confusing shifts in moods, emotions, and thoughts. All mothers are at risk of developing postpartum depression or the "baby blues." These mood changes can occur right after giving birth, or they may occur many months after giving birth. The baby blues or postpartum depression can be mild or severe. Additionally, postpartum depression can resolve rather quickly, or it can be a long-term condition. CAUSES Elevated hormones and their rapid decline are thought to be a main cause of postpartum depression and the baby blues. There are a number of hormones that radically change during and after pregnancy. Estrogen and progesterone usually decrease immediately after delivering your baby. The level of thyroid hormone and various cortisol steroids also rapidly drop. Other factors that play a major role in these changes include major life events and genetics.  RISK FACTORS If you have any of the following risks for the baby blues or postpartum depression, know what symptoms to watch out for during the postpartum period. Risk factors that may increase the likelihood of getting the baby blues or postpartum depression include:  Havinga personal or family history of depression.   Having depression while being pregnant.   Having premenstrual or oral contraceptive-associated mood issues.   Having exceptional life stress.   Having marital conflict.   Lacking a social support network.   Having a baby with special needs.   Having health problems such as diabetes.  SYMPTOMS Baby blues  symptoms include:  Brief fluctuations in mood, such as going from extreme happiness to sadness.   Decreased concentration.   Difficulty sleeping.   Crying spells, tearfulness.   Irritability.   Anxiety.  Postpartum depression symptoms typically begin within the first month after giving birth. These symptoms include:  Difficulty sleeping or excessive sleepiness.   Marked weight loss.   Agitation.   Feelings of worthlessness.   Lack of interest in activity or food.  Postpartum psychosis is a very concerning condition and can be dangerous. Fortunately, it is rare. Displaying any of the following symptoms is cause for immediate medical attention. Postpartum psychosis symptoms include:  Hallucinations and delusions.   Bizarre or disorganized behavior.   Confusion or disorientation.  DIAGNOSIS  A diagnosis is made by an evaluation of your symptoms. There are no medical or lab tests that lead to a diagnosis, but there are various questionnaires that a caregiver may use to identify those with the baby blues, postpartum depression, or psychosis. Often times, a screening tool called the [place] Postnatal Depression Scale is used to diagnose depression in the postpartum period.  TREATMENT The baby blues usually goes away on its own in 1 to 2 weeks. Social support is often all that is needed. You should be encouraged to get adequate sleep and rest. Occasionally, you may be given medicines to help you sleep.  Postpartum depression requires treatment as it can last several months or longer if it is not treated. Treatment may include individual or group therapy, medicine,  or both to address any social, physiological, and psychological factors that may play a role in the depression. Regular exercise, a healthy diet, rest, and social support may also be strongly recommended.  Postpartum psychosis is more serious and needs treatment right away. Hospitalization is often needed. HOME CARE  INSTRUCTIONS  Get as much rest as you can. Nap when the baby sleeps.   Exercise regularly. Some women find yoga and walking to be beneficial.   Eat a balanced and nourishing diet.   Do little things that you enjoy. Have a cup of tea, take a bubble bath, read your favorite magazine, or listen to your favorite music.   Avoid alcohol.   Ask for help with household chores, cooking, grocery shopping, or running errands as needed. Do not try to do everything.   Talk to people close to you about how you are feeling. Get support from your partner, family members, friends, or other new moms.   Try to stay positive in how you think. Think about the things you are grateful for.   Do not spend a lot of time alone.   Only take medicine as directed by your caregiver.   Keep all your postpartum appointments.   Let your caregiver know if you have any concerns.  SEEK MEDICAL CARE IF: You are having a reaction or problems with your medicine. SEEK IMMEDIATE MEDICAL CARE IF:  You have suicidal feelings.   You feel you may harm the baby or someone else.  Document Released: 04/01/2004 Document Revised: 06/17/2011 Document Reviewed: 05/04/2011 [facility] Patient Information 2012 [place], [place]. Breastfeeding BENEFITS OF BREASTFEEDING For the baby  The first milk (colostrum) helps the baby's digestive system function better.   There are antibodies from the mother in the milk that help the baby fight off infections.   The baby has a lower incidence of asthma, allergies, and SIDS (sudden infant death syndrome).   The nutrients in breast milk are better than formulas for the baby and helps the baby's brain grow better.   Babies who breastfeed have less gas, colic, and constipation.  For the mother  Breastfeeding helps develop a very special bond between mother and baby.   It is more convenient, always available at the correct temperature and cheaper than formula feeding.   It burns calories  in the mother and helps with losing weight that was gained during pregnancy.   It makes the uterus contract back down to normal size faster and slows bleeding following delivery.   Breastfeeding mothers have a lower risk of developing breast cancer.  NURSE FREQUENTLY  A healthy, full-term baby may breastfeed as often as every hour or space his or her feedings to every 3 hours.   How often to nurse will vary from baby to baby. Watch your baby for signs of hunger, not the clock.   Nurse as often as the baby requests, or when you feel the need to reduce the fullness of your breasts.   Awaken the baby if it has been 3 to 4 hours since the last feeding.   Frequent feeding will help the mother make more milk and will prevent problems like sore nipples and engorgement of the breasts.  BABY'S POSITION AT THE BREAST  Whether lying down or sitting, be sure that the baby's tummy is facing your tummy.   Support the breast with 4 fingers underneath the breast and the thumb above. Make sure your fingers are well away from the nipple and baby's mouth.  Stroke the baby's lips and cheek closest to the breast gently with your finger or nipple.   When the baby's mouth is open wide enough, place all of your nipple and as much of the dark area around the nipple as possible into your baby's mouth.   Pull the baby in close so the tip of the nose and the baby's cheeks touch the breast during the feeding.  FEEDINGS  The length of each feeding varies from baby to baby and from feeding to feeding.   The baby must suck about 2 to 3 minutes for your milk to get to him or her. This is called a "let down." For this reason, allow the baby to feed on each breast as long as he or she wants. Your baby will end the feeding when he or she has received the right balance of nutrients.   To break the suction, put your finger into the corner of the baby's mouth and slide it between his or her gums before removing your  breast from his or her mouth. This will help prevent sore nipples.  REDUCING BREAST ENGORGEMENT  In the first week after your baby is born, you may experience signs of breast engorgement. When breasts are engorged, they feel heavy, warm, full, and may be tender to the touch. You can reduce engorgement if you:   Nurse frequently, every 2 to 3 hours. Mothers who breastfeed early and often have fewer problems with engorgement.   Place light ice packs on your breasts between feedings. This reduces swelling. Wrap the ice packs in a lightweight towel to protect your skin.   Apply moist hot packs to your breast for 5 to 10 minutes before each feeding. This increases circulation and helps the milk flow.   Gently massage your breast before and during the feeding.   Make sure that the baby empties at least one breast at every feeding before switching sides.   Use a breast pump to empty the breasts if your baby is sleepy or not nursing well. You may also want to pump if you are returning to work or or you feel you are getting engorged.   Avoid bottle feeds, pacifiers or supplemental feedings of water or juice in place of breastfeeding.   Be sure the baby is latched on and positioned properly while breastfeeding.   Prevent fatigue, stress, and anemia.   Wear a supportive bra, avoiding underwire styles.   Eat a balanced diet with enough fluids.  If you follow these suggestions, your engorgement should improve in 24 to 48 hours. If you are still experiencing difficulty, call your lactation consultant or caregiver. IS MY BABY GETTING ENOUGH MILK? Sometimes, mothers worry about whether their babies are getting enough milk. You can be assured that your baby is getting enough milk if:  The baby is actively sucking and you hear swallowing.   The baby nurses at least 8 to 12 times in a 24 hour time period. Nurse your baby until he or she unlatches or falls asleep at the first breast (at least 10 to 20  minutes), then offer the second side.   The baby is wetting 5 to 6 disposable diapers (6 to 8 cloth diapers) in a 24 hour period by 59 to 26 days of age.   The baby is having at least 2 to 3 stools every 24 hours for the first few months. Breast milk is all the food your baby needs. It is not necessary for your  baby to have water or formula. In fact, to help your breasts make more milk, it is best not to give your baby supplemental feedings during the early weeks.   The stool should be soft and yellow.   The baby should gain 4 to 7 ounces per week after he is 64 days old.  TAKE CARE OF YOURSELF Take care of your breasts by:  Bathing or showering daily.   Avoiding the use of soaps on your nipples.   Start feedings on your left breast at one feeding and on your right breast at the next feeding.   You will notice an increase in your milk supply 2 to 5 days after delivery. You may feel some discomfort from engorgement, which makes your breasts very firm and often tender. Engorgement "peaks" out within 24 to 48 hours. In the meantime, apply warm moist towels to your breasts for 5 to 10 minutes before feeding. Gentle massage and expression of some milk before feeding will soften your breasts, making it easier for your baby to latch on. Wear a well fitting nursing bra and air dry your nipples for 10 to 15 minutes after each feeding.   Only use cotton bra pads.   Only use pure lanolin on your nipples after nursing. You do not need to wash it off before nursing.  Take care of yourself by:   Eating well-balanced meals and nutritious snacks.   Drinking milk, fruit juice, and water to satisfy your thirst (about 8 glasses a day).   Getting plenty of rest.   Increasing calcium in your diet (1200 mg a day).   Avoiding foods that you notice affect the baby in a bad way.  SEEK MEDICAL CARE IF:   You have any questions or difficulty with breastfeeding.   You need help.   You have a hard, red, sore  area on your breast, accompanied by a fever of 100.5 F (38.1 C) or more.   Your baby is too sleepy to eat well or is having trouble sleeping.   Your baby is wetting less than 6 diapers per day, by 72 days of age.   Your baby's skin or white part of his or her eyes is more yellow than it was in the hospital.   You feel depressed.  Document Released: 06/28/2005 Document Revised: 06/17/2011 Document Reviewed: 02/10/2009 [facility] Patient Information 2012 [place], [place].

## 2012-02-03 NOTE — Progress Notes (Signed)
Appointment scheduled with [facility] with Remonia Richter NP on July 30th at 10 am. U/S scheduled July 29th at 330pm. Waiting on referral form to [facility]. Patient advised they will contact her about an appointment.

## 2012-02-03 NOTE — Progress Notes (Signed)
Pulse: 91 Needs to see nutrition and SW. Had initial labs at [facility], results not yet received.  Has discharge with itching and burning.

## 2012-02-04 LAB — WET PREP, GENITAL
Trich, Wet Prep: NONE SEEN
Yeast Wet Prep HPF POC: NONE SEEN

## 2012-02-04 LAB — GC/CHLAMYDIA PROBE AMP, GENITAL
Chlamydia, DNA Probe: NEGATIVE
GC Probe Amp, Genital: NEGATIVE

## 2012-02-04 NOTE — Addendum Note (Signed)
Addended by: Lesly Dukes on: 02/04/2012 10:01 AM   Modules accepted: Orders

## 2012-02-07 ENCOUNTER — Ambulatory Visit (HOSPITAL_COMMUNITY): Admission: RE | Admit: 2012-02-07 | Payer: Medicaid Other | Source: Ambulatory Visit

## 2012-02-08 ENCOUNTER — Ambulatory Visit: Payer: Medicaid Other | Admitting: Nurse Practitioner

## 2012-02-08 DIAGNOSIS — R51 Headache: Secondary | ICD-10-CM

## 2012-02-14 MED ORDER — SERTRALINE HCL 50 MG PO TABS: 50.0000 mg | ORAL_TABLET | Freq: Every day | ORAL | Status: DC

## 2012-02-14 NOTE — Addendum Note (Signed)
Addended by: Lesly Dukes on: 02/14/2012 09:27 PM   Modules accepted: Orders

## 2012-02-16 ENCOUNTER — Encounter (HOSPITAL_COMMUNITY): Payer: Self-pay | Admitting: *Deleted

## 2012-02-16 ENCOUNTER — Inpatient Hospital Stay (HOSPITAL_COMMUNITY)
Admission: RE | Admit: 2012-02-16 | Discharge: 2012-02-16 | Disposition: A | Discharge status: Home / Self Care | Payer: Medicaid Other | Source: Ambulatory Visit | Attending: Obstetrics and Gynecology | Admitting: Obstetrics and Gynecology

## 2012-02-16 ENCOUNTER — Telehealth: Payer: Self-pay

## 2012-02-16 DIAGNOSIS — G43909 Migraine, unspecified, not intractable, without status migrainosus: Secondary | ICD-10-CM | POA: Insufficient documentation

## 2012-02-16 DIAGNOSIS — N949 Unspecified condition associated with female genital organs and menstrual cycle: Secondary | ICD-10-CM | POA: Insufficient documentation

## 2012-02-16 DIAGNOSIS — O212 Late vomiting of pregnancy: Secondary | ICD-10-CM | POA: Insufficient documentation

## 2012-02-16 DIAGNOSIS — O99891 Other specified diseases and conditions complicating pregnancy: Secondary | ICD-10-CM | POA: Insufficient documentation

## 2012-02-16 HISTORY — DX: Psoriasis, unspecified: L40.9

## 2012-02-16 HISTORY — DX: Chlamydial infection, unspecified: A74.9

## 2012-02-16 HISTORY — DX: Adverse effect of unspecified anesthetic, initial encounter: T41.45XA

## 2012-02-16 HISTORY — DX: Other complications of anesthesia, initial encounter: T88.59XA

## 2012-02-16 HISTORY — DX: Acute parametritis and pelvic cellulitis: N73.0

## 2012-02-16 LAB — CBC WITH DIFFERENTIAL/PLATELET
Basophils Absolute: 0 10*3/uL (ref 0.0–0.1)
Basophils Relative: 0 % (ref 0–1)
Eosinophils Absolute: 0 10*3/uL (ref 0.0–0.7)
Eosinophils Relative: 1 % (ref 0–5)
HCT: 31.2 % — ABNORMAL LOW (ref 36.0–46.0)
Hemoglobin: 10.4 g/dL — ABNORMAL LOW (ref 12.0–15.0)
Lymphocytes Relative: 17 % (ref 12–46)
Lymphs Abs: 1 10*3/uL (ref 0.7–4.0)
MCH: 27.4 pg (ref 26.0–34.0)
MCHC: 33.3 g/dL (ref 30.0–36.0)
MCV: 82.3 fL (ref 78.0–100.0)
Monocytes Absolute: 0.5 10*3/uL (ref 0.1–1.0)
Monocytes Relative: 8 % (ref 3–12)
Neutro Abs: 4.3 10*3/uL (ref 1.7–7.7)
Neutrophils Relative %: 75 % (ref 43–77)
Platelets: 148 10*3/uL — ABNORMAL LOW (ref 150–400)
RBC: 3.79 MIL/uL — ABNORMAL LOW (ref 3.87–5.11)
RDW: 14 % (ref 11.5–15.5)
WBC: 5.8 10*3/uL (ref 4.0–10.5)

## 2012-02-16 LAB — URINALYSIS, ROUTINE W REFLEX MICROSCOPIC
Bilirubin Urine: NEGATIVE
Glucose, UA: NEGATIVE mg/dL
Hgb urine dipstick: NEGATIVE
Ketones, ur: 15 mg/dL — AB
Nitrite: NEGATIVE
Protein, ur: NEGATIVE mg/dL
Specific Gravity, Urine: 1.025 (ref 1.005–1.030)
Urobilinogen, UA: 0.2 mg/dL (ref 0.0–1.0)
pH: 7 (ref 5.0–8.0)

## 2012-02-16 LAB — WET PREP, GENITAL
Clue Cells Wet Prep HPF POC: NONE SEEN
Trich, Wet Prep: NONE SEEN
Yeast Wet Prep HPF POC: NONE SEEN

## 2012-02-16 LAB — URINE MICROSCOPIC-ADD ON

## 2012-02-16 MED ORDER — SODIUM CHLORIDE 0.9 % IV BOLUS (SEPSIS)
1000.0000 mL | Freq: Once | INTRAVENOUS | Status: AC
  Administered 2012-02-16: 1000 mL via INTRAVENOUS

## 2012-02-16 MED ORDER — ONDANSETRON 4 MG PO TBDP
4.0000 mg | ORAL_TABLET | Freq: Once | ORAL | Status: AC
  Administered 2012-02-16: 4 mg via ORAL
  Filled 2012-02-16: qty 1

## 2012-02-16 MED ORDER — DEXAMETHASONE SODIUM PHOSPHATE 10 MG/ML IJ SOLN
8.0000 mg | Freq: Once | INTRAMUSCULAR | Status: AC
  Administered 2012-02-16: 8 mg via INTRAVENOUS
  Filled 2012-02-16: qty 1
  Filled 2012-02-16: qty 2

## 2012-02-16 MED ORDER — METOCLOPRAMIDE HCL 5 MG/ML IJ SOLN
10.0000 mg | Freq: Once | INTRAMUSCULAR | Status: AC
  Administered 2012-02-16: 10 mg via INTRAVENOUS
  Filled 2012-02-16: qty 2

## 2012-02-16 MED ORDER — OXYCODONE-ACETAMINOPHEN 5-325 MG PO TABS
1.0000 | ORAL_TABLET | Freq: Once | ORAL | Status: AC
  Administered 2012-02-16: 1 via ORAL
  Filled 2012-02-16: qty 1

## 2012-02-16 NOTE — MAU Note (Signed)
Was given Keflex for an infection when she was in the ER, kept throwing it up, so stopped taking it.

## 2012-02-16 NOTE — Telephone Encounter (Signed)
Called pt and was unable to leave message due to "the person you are trying to call is not available please try your call later".

## 2012-02-16 NOTE — ED Provider Notes (Signed)
Chief Complaint:  Loss of Consciousness and Dysmenorrhea  @MAUPATCONTACT @  HPI  Chloe Rodgers is a 21 y.o. G2P1001 at [redacted]w[redacted]d presenting with headache and fainting spells.  Reports a history of seizures, with last seizure 3 months ago.  Has had a headache for the last few weeks that comes and goes, tylenol has not helped.  States they are typical of her migraines - throbbing, photophobia.  Has had some nausea and watery diarrhea the last few days, denies emesis, but has not been eating or drinking well due to the nausea.  For the last few days has been getting dizzy with standing and passed out yesterday (was seen at [facility] regional and discharged).  Also complaining of a "bad yeast" infection and some abdominal cramping.  Denies contractions, leakage of fluid or vaginal bleeding. Good fetal movement.   Pregnancy Course: HRC for seizures, depression  Past Medical History: Past Medical History  Diagnosis Date  . Anemia   . Depression   . Complication of anesthesia     ??, seizure with wisdom teeth  . Headache   . Seizures     febrile  . Urinary tract infection   . Psoriasis   . PID (acute pelvic inflammatory disease)   . Chlamydia     Past Surgical History: Past Surgical History  Procedure Date  . Cesarean section   . Skin graft     off abd, onto arm  . Wisdom tooth extraction     Family History: Family History  Problem Relation Age of Onset  . Hypertension Mother   . Diabetes Mother   . Cancer Mother     BREAST  . Stroke Mother   . Seizures Mother   . Asthma Daughter   . Hypertension Maternal Grandmother   . Diabetes Maternal Grandmother   . Cancer Maternal Grandmother     BONE CANCER  . Other Neg Hx     Social History: History  Substance Use Topics  . Smoking status: Never Smoker   . Smokeless tobacco: Never Used  . Alcohol Use: No    Allergies: No Known Allergies  Meds:  Prescriptions prior to admission  Medication Sig Dispense Refill  . cephALEXin  (KEFLEX) 500 MG capsule Take 500 mg by mouth every 8 (eight) hours.      . Prenatal Vit-Fe Fumarate-FA (PRENATAL MULTIVITAMIN) TABS Take 1 tablet by mouth every morning.      Marland Kitchen terconazole (TERAZOL 7) 0.4 % vaginal cream Place 1 applicator vaginally at bedtime.      . sertraline (ZOLOFT) 50 MG tablet Take 1 tablet (50 mg total) by mouth daily.  30 tablet  3      Physical Exam  Blood pressure 113/67, pulse 107, temperature 97.3 F (36.3 C), temperature source Oral, resp. rate 20, last menstrual period 09/07/2011, SpO2 100.00%, unknown if currently breastfeeding. GENERAL: Well-developed, well-nourished female in no acute distress.  HEENT: normocephalic, good dentition HEART: normal rate and rhythm, no murmurs RESP: normal effort, CTAB ABDOMEN: Soft, nontender, gravid appropriate for gestational age EXTREMITIES: Nontender, no edema NEURO: alert and oriented  SPECULUM EXAM: normal vulva and vagina; moderate amount of thick white discharge in vagina  Dilation: Closed Effacement (%): Thick Exam by:: Elwyn Reach  FHT: 150 Contractions: none   Labs: Wet prep: negative  Lab 02/16/12 1305  WBC 5.8  HGB 10.4*  HCT 31.2*  PLT 148*    Imaging:    Assessment: 21 yo G2P1001 at [redacted]w[redacted]d with migraine headache, nausea, vaginal discharge - orthostatic  vitals negative   Plan: - will give percocet 5-325 x1 for headache --> no relief - will give zofran ODT 4mg  x1 for nausea --> minimal improvement, likely related to migraine - PO fluid trial - will give 1L NS bolus, decadron 8mg  IV, reglan 10mg  IV --> migraine and nausea resolved - will d/c home with preterm labor precautions; discussed avoidance of migraine triggers  Discussed with Dr. Adrian Blackwater.   BOOTH, ERIN 8/7/201312:41 PM  Patient seen and examined.  Agree with above note.  Levie Heritage, DO 02/16/2012 4:51 PM

## 2012-02-16 NOTE — MAU Note (Signed)
Lilia Argue, SW called and notified of request to come see pt. Brief hx and today's complaints reviewed.

## 2012-02-16 NOTE — Telephone Encounter (Signed)
Message copied by Faythe Casa on Wed Feb 16, 2012  9:30 AM ------      Message from: [place], VANESSA G      Created: Tue Feb 15, 2012 10:36 AM                   ----- Message -----         From: Lesly Dukes, MD         Sent: 02/14/2012   9:27 PM           To: [facility] Admin Pool            Pt need pharmacy entered and then the zoloft presciption called in.

## 2012-02-16 NOTE — MAU Note (Signed)
Teadra SW in with pt.

## 2012-02-16 NOTE — MAU Note (Signed)
Has been feeling bad last 3 days.  Passed out yesterday at school, was taken to [place] and dc'd. Cramping in lower abd.  Can't keep anything down.

## 2012-02-17 NOTE — Telephone Encounter (Signed)
Called [name]'s home number and heard message that the person in unavailable. Called [name]'s contact number and heard message this is not a working number.

## 2012-02-17 NOTE — Telephone Encounter (Signed)
Patient called and left message requesting for a medical leave letter excusing her from the absences that she made in school due to pregnancy. Patient left call back # [phone]. Per Maylon Cos, patient would need to discuss this issue with the provider on next OB visit. Tried to call back patient to give information; no answer; left message to call back.

## 2012-02-17 NOTE — Progress Notes (Signed)
LATE ENTRY FROM 02/16/12:  Sw met with pt after she expressed feelings of depression and stress.  Pt and her 43 month old daughter is currently living with her mother.  Pt told Sw that she and her mother have verbal arguments almost, daily but did not elaborate.  Additionally, she talked about having a high risk pregnancy, which is likely cause her to quit school.  Pt states she is stressed because she will be required to pay back the financial aid she has received.  She is [redacted] weeks pregnant with limited support.  FOB is not involved and currently living in [place].  Pt was prescribed Zoloft last month but never got the prescription filled because she couldn't afford the $3 copay.  She does not have any income but recently applied for the [employer] program.  She denies any SI.  She has participated in counseling in the past in [place] and expressed interest in being referred to a counselor in this area.  Sw made a referral to [facility].  MAU staff donated $3 for the pt to obtain her antidepressant medication.  Pt appeared to be thankful and promised this Sw that she would get prescription filled.

## 2012-02-21 ENCOUNTER — Telehealth: Payer: Self-pay | Admitting: General Practice

## 2012-02-21 NOTE — Telephone Encounter (Signed)
Pt is scheduled to come in on Thursday to discuss her concerns with a provider.

## 2012-02-21 NOTE — Telephone Encounter (Signed)
Called patient back stating we had received her voice message about needing a note for school and that we could move up her appt to Thursday the 15th at 9am so that a provider could see her sooner so that she could receive a note. Patient states that she would be at that appt and is happy about the appt change. Patient states she has no further questions and verified the appt time for Thursday the 15th at 9am.

## 2012-02-21 NOTE — Telephone Encounter (Signed)
Patient called stating she needs a doctors note for school as soon as possible. Patient states she was told she would have to wait till her appt on the 22nd, but that school needs this note as soon as possible or she will be dropped from school. Patient also states she needs a note for medical leave until the baby is born because she cannot handle school with the circumstances that are going on and has already passed out two times at school and cannot handle being in school at this time. Patient states she would like a call back and needs this note as soon as possible.

## 2012-02-22 ENCOUNTER — Ambulatory Visit (INDEPENDENT_AMBULATORY_CARE_PROVIDER_SITE_OTHER): Payer: Medicaid Other | Admitting: Nurse Practitioner

## 2012-02-22 ENCOUNTER — Encounter: Payer: Self-pay | Admitting: Nurse Practitioner

## 2012-02-22 VITALS — BP 106/65 | HR 95 | Ht 66.0 in | Wt 233.0 lb

## 2012-02-22 DIAGNOSIS — G43009 Migraine without aura, not intractable, without status migrainosus: Secondary | ICD-10-CM

## 2012-02-22 DIAGNOSIS — O219 Vomiting of pregnancy, unspecified: Secondary | ICD-10-CM

## 2012-02-22 MED ORDER — PROMETHAZINE HCL 25 MG PO TABS: 25.0000 mg | ORAL_TABLET | Freq: Four times a day (QID) | ORAL | Status: DC | PRN

## 2012-02-22 MED ORDER — SUMATRIPTAN SUCCINATE 100 MG PO TABS: 100.0000 mg | ORAL_TABLET | Freq: Once | ORAL | Status: DC | PRN

## 2012-02-22 NOTE — Patient Instructions (Signed)

## 2012-02-22 NOTE — Progress Notes (Signed)
Diagnosis: Migraine without aura. Pregnancy  History: Pt in in office today for migraine consult. She has had migraine since 52-21 years old. Denies any aura. G2P1. She is currently [redacted] weeks pregnant with her second girl. She has a daughter that is 38 months old. The FOB is same for both children. He is not supportive at all. She is living with her mother who is dealing with depression and her own issues. They argue a lot. She has been placed on Zoloft by her OB-GYN about one week ago and so far has not noticed a difference. She took Zoloft in her last pregnancy part of the time. She has taken a triptan in the past with no problems. When she gets a severe it may last all day.  Location: Left or Right temple  Number of Headache days/month: Severe: 5 Moderate:3 Mild:2  Current Outpatient Prescriptions on File Prior to Visit  Medication Sig Dispense Refill  . Prenatal Vit-Fe Fumarate-FA (PRENATAL MULTIVITAMIN) TABS Take 1 tablet by mouth every morning.      . promethazine (PHENERGAN) 25 MG tablet Take 1 tablet (25 mg total) by mouth every 6 (six) hours as needed for nausea.  30 tablet  1  . sertraline (ZOLOFT) 50 MG tablet Take 1 tablet (50 mg total) by mouth daily.  30 tablet  3  . SUMAtriptan (IMITREX) 100 MG tablet Take 1 tablet (100 mg total) by mouth once as needed for migraine.  9 tablet  11    Acute prevention: tylenol, Zoloft  Past Medical History  Diagnosis Date  . Anemia   . Depression   . Complication of anesthesia     ??, seizure with wisdom teeth  . Headache   . Seizures     febrile  . Urinary tract infection   . Psoriasis   . PID (acute pelvic inflammatory disease)   . Chlamydia    Past Surgical History  Procedure Date  . Cesarean section   . Skin graft     off abd, onto arm  . Wisdom tooth extraction    Family History  Problem Relation Age of Onset  . Hypertension Mother   . Diabetes Mother   . Cancer Mother     BREAST  . Stroke Mother   . Seizures Mother    . Asthma Daughter   . Hypertension Maternal Grandmother   . Diabetes Maternal Grandmother   . Cancer Maternal Grandmother     BONE CANCER  . Other Neg Hx    Social History:  reports that she has never smoked. She has never used smokeless tobacco. She reports that she does not drink alcohol or use illicit drugs. Allergies: No Known Allergies  Triggers: Stress  Birth control: Pregnancy  ROS: Migraine, nausea, obesity  Exam: well developed, well nourished AA female   General: Have scarring over face and arms HEENT: negative Cardiac: RRR Lungs:Clear Neuro: Negative Skin:Scarring FHT: 146  Impression:migraine - common  Plan We discussed the risk/ benefit ratio to triptans in pregnancy. She is aware of risk and is willing to assume risk. She will use phenergan as needed. She is asked to call her social worker for help with social issues and to stay on Zoloft  Time Spent: 45 min

## 2012-02-22 NOTE — Progress Notes (Signed)
24wks preg, getting PNC at [facility], referred here for migraines

## 2012-02-24 ENCOUNTER — Encounter: Payer: Medicaid Other | Admitting: Obstetrics & Gynecology

## 2012-02-28 ENCOUNTER — Encounter: Payer: Medicaid Other | Admitting: Obstetrics & Gynecology

## 2012-03-02 ENCOUNTER — Ambulatory Visit (INDEPENDENT_AMBULATORY_CARE_PROVIDER_SITE_OTHER): Payer: Medicaid Other | Admitting: Advanced Practice Midwife

## 2012-03-02 ENCOUNTER — Encounter: Payer: Medicaid Other | Admitting: Advanced Practice Midwife

## 2012-03-02 VITALS — BP 115/70 | Temp 97.1°F | Wt 232.1 lb

## 2012-03-02 DIAGNOSIS — G40909 Epilepsy, unspecified, not intractable, without status epilepticus: Secondary | ICD-10-CM

## 2012-03-02 DIAGNOSIS — F329 Major depressive disorder, single episode, unspecified: Secondary | ICD-10-CM

## 2012-03-02 DIAGNOSIS — O0990 Supervision of high risk pregnancy, unspecified, unspecified trimester: Secondary | ICD-10-CM

## 2012-03-02 LAB — POCT URINALYSIS DIP (DEVICE)
Bilirubin Urine: NEGATIVE
Glucose, UA: NEGATIVE mg/dL
Hgb urine dipstick: NEGATIVE
Ketones, ur: NEGATIVE mg/dL
Nitrite: NEGATIVE
Protein, ur: 30 mg/dL — AB
Specific Gravity, Urine: 1.025 (ref 1.005–1.030)
Urobilinogen, UA: 0.2 mg/dL (ref 0.0–1.0)
pH: 7 (ref 5.0–8.0)

## 2012-03-02 MED ORDER — SERTRALINE HCL 50 MG PO TABS: 50.0000 mg | ORAL_TABLET | Freq: Every day | ORAL | Status: DC

## 2012-03-02 NOTE — Progress Notes (Signed)
Plans BTL and RLTCS. Consents signed. Missed anatomy US at [facility] due to transportation issues. Had one in [place]. Questions about due date. States early [place] in [place] EDD 06/09/12. Informed her that we use EDD by LMP if less than 1 week difference. Will request both [place] reports.  Requesting note for leave from school. States she gets very hot, cannot have water in class (around cosmetic chemicals) and has had episodes of syncope or near-syncope (seen in MAU). Also states hot environment has triggered seizures in past. Note given to stay out until 07/25/11--start date of new semester. 1 hour GTT at [place].

## 2012-03-02 NOTE — Progress Notes (Signed)
P= 111 Edema mild in feet and face Mild pressure in lower pelvic area

## 2012-03-23 ENCOUNTER — Encounter: Payer: Medicaid Other | Admitting: Family Medicine

## 2012-03-28 ENCOUNTER — Telehealth: Payer: Self-pay | Admitting: *Deleted

## 2012-03-28 NOTE — Telephone Encounter (Signed)
Called pt and discussed her concern. She stated that she has been having abdominal cramping for several days. She had gone to [facility] regional ED and was sent home after evaluation. She states she was told everything was fine even though she had a headache. She also states that she has fallen twice in the past couple days. One time she fell on her bottom outside in a ditch and the second time she fell on her side inside the house. She states she has dizzy spells and this caused her to fall. Her abdominal cramping has become worse since falling. Pt endorses good FM.  I explained that after falling it is normal to have body aches and pains. She may take Tylenol per package dose instructions. She should come to MAU if her cramping becomes worse or if she has sx of labor. These sx were reviewed w/pt. 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  I asked pt to discuss her question on 03/30/12 during her prenatal visit.  She should bring any necessary paperwork to be completed. Pt voiced understanding of all information and instructions.

## 2012-03-28 NOTE — Telephone Encounter (Signed)
Pt left message yesterday stating that she had been having cramping and had fallen 2 times. She wanted to know if she should go to the Emergency room. Please call back.  I called pt today and attempted to discuss her concerns. We were unable to have a conversation because she has a sick child and the child was crying and screaming during our call. Chloe Rodgers asked me to call back in an hour and I agreed.

## 2012-03-29 ENCOUNTER — Telehealth: Payer: Self-pay | Admitting: *Deleted

## 2012-03-29 NOTE — Telephone Encounter (Signed)
Counselor Trina Ao calling from [facility] to provide information about pt. She states that Chloe Rodgers is receiving outpatient counseling for PTSD concerning some past experiences. Pinky is having paranoidal ideations, is depressed and cannot sleep. She is living in a very stressful environment with her mother and has financial and social concerns. Pt and Counseling Center would like consideration for medication from MD @ tomorrow's visit. Pt has previously taken Zoloft which did not help.  I called back and left message for Crissie Figures @ the Counseling center that I have documented the information she provided and we will address these concerns tomorrow w/pt.

## 2012-03-30 ENCOUNTER — Ambulatory Visit (INDEPENDENT_AMBULATORY_CARE_PROVIDER_SITE_OTHER): Payer: Medicaid Other | Admitting: Obstetrics and Gynecology

## 2012-03-30 VITALS — BP 120/72 | Temp 97.0°F | Wt 232.2 lb

## 2012-03-30 DIAGNOSIS — G40909 Epilepsy, unspecified, not intractable, without status epilepticus: Secondary | ICD-10-CM

## 2012-03-30 DIAGNOSIS — Z9889 Other specified postprocedural states: Secondary | ICD-10-CM

## 2012-03-30 DIAGNOSIS — O34219 Maternal care for unspecified type scar from previous cesarean delivery: Secondary | ICD-10-CM | POA: Insufficient documentation

## 2012-03-30 DIAGNOSIS — E669 Obesity, unspecified: Secondary | ICD-10-CM | POA: Insufficient documentation

## 2012-03-30 DIAGNOSIS — O09899 Supervision of other high risk pregnancies, unspecified trimester: Secondary | ICD-10-CM | POA: Insufficient documentation

## 2012-03-30 DIAGNOSIS — Z98891 History of uterine scar from previous surgery: Secondary | ICD-10-CM

## 2012-03-30 LAB — POCT URINALYSIS DIP (DEVICE)
Bilirubin Urine: NEGATIVE
Glucose, UA: NEGATIVE mg/dL
Hgb urine dipstick: NEGATIVE
Ketones, ur: NEGATIVE mg/dL
Nitrite: NEGATIVE
Protein, ur: 30 mg/dL — AB
Specific Gravity, Urine: 1.02 (ref 1.005–1.030)
Urobilinogen, UA: 1 mg/dL (ref 0.0–1.0)
pH: 7 (ref 5.0–8.0)

## 2012-03-30 NOTE — Progress Notes (Signed)
Fell in ditch 3-4 d ago and cramps since. No VB. Good FM. Seen [facility] for cramps and [facility] for H/A in past wk. Was dx BV but never picked up Rx: advised to do so.   No urinary sx. Lg LE and 30 pro-> urine C&S.  On Zoloft for depression - not helping. C/W Dr. Shawnie Pons -> increase to 100mg  qd. No sz meds - last was 4 mo ago in [place]. Saw neurologist 3-4 wks ago. [facility] and to reschedule there for F/U but will call. Plan is MRI PP. Get ROI. Prior C/S FTP past 7, wt 8-14. Plans RLTCS. States she understands R/B from H/O given here.  See Diane's note and pt to see SW today.

## 2012-03-30 NOTE — Patient Instructions (Signed)
Round Ligament Pain The round ligament is made up of muscle and fibrous tissue. It is attached to the uterus near the fallopian tube. The round ligament is located on both sides of the uterus and helps support the position of the uterus. It usually begins in the second trimester of pregnancy when the uterus comes out of the pelvis. The pain can come and go until the baby is delivered. Round ligament pain is not a serious problem and does not cause harm to the baby. CAUSE During pregnancy the uterus grows the most from the second trimester to delivery. As it grows, it stretches and slightly twists the round ligaments. When the uterus leans from one side to the other, the round ligament on the opposite side pulls and stretches. This can cause pain. SYMPTOMS  Pain can occur on one side or both sides. The pain is usually a short, sharp, and pinching-like. Sometimes it can be a dull, lingering and aching pain. The pain is located in the lower side of the abdomen or in the groin. The pain is internal and usually starts deep in the groin and moves up to the outside of the hip area. Pain can occur with:  Sudden change in position like getting out of bed or a chair.   Rolling over in bed.   Coughing or sneezing.   Walking too much.   Any type of physical activity.  DIAGNOSIS  Your caregiver will make sure there are no serious problems causing the pain. When nothing serious is found, the symptoms usually indicate that the pain is from the round ligament. TREATMENT   Sit down and relax when the pain starts.   Flex your knees up to your belly.   Lay on your side with a pillow under your belly (abdomen) and another one between your legs.   Sit in a hot bath for 15 to 20 minutes or until the pain goes away.  HOME CARE INSTRUCTIONS   Only take over-the-counter or prescriptions medicines for pain, discomfort or fever as directed by your caregiver.   Sit and stand slowly.   Avoid long walks if it  causes pain.   Stop or lessen your physical activities if it causes pain.  SEEK MEDICAL CARE IF:   The pain does not go away with any of your treatment.   You need stronger medication for the pain.   You develop back pain that you did not have before with the side pain.  SEEK IMMEDIATE MEDICAL CARE IF:   You develop a temperature of 102 F (38.9 C) or higher.   You develop uterine contractions.   You develop vaginal bleeding.   You develop nausea, vomiting or diarrhea.   You develop chills.   You have pain when you urinate.  Document Released: 04/06/2008 Document Revised: 06/17/2011 Document Reviewed: 04/06/2008 [employer] Patient Information 2012 [employer]. 

## 2012-03-30 NOTE — Progress Notes (Signed)
Pulse- 97  Pressure-pelvic  Pain-from fall Recently was in ER in [place] and in [facility]- pt filled out release of info.

## 2012-04-01 ENCOUNTER — Inpatient Hospital Stay (HOSPITAL_COMMUNITY)
Admission: AD | Admit: 2012-04-01 | Discharge: 2012-04-01 | Disposition: A | Discharge status: Home / Self Care | Payer: Medicaid Other | Source: Ambulatory Visit | Attending: Obstetrics & Gynecology | Admitting: Obstetrics & Gynecology

## 2012-04-01 ENCOUNTER — Encounter (HOSPITAL_COMMUNITY): Payer: Self-pay

## 2012-04-01 DIAGNOSIS — K5900 Constipation, unspecified: Secondary | ICD-10-CM | POA: Insufficient documentation

## 2012-04-01 DIAGNOSIS — O26899 Other specified pregnancy related conditions, unspecified trimester: Secondary | ICD-10-CM

## 2012-04-01 DIAGNOSIS — IMO0002 Reserved for concepts with insufficient information to code with codable children: Secondary | ICD-10-CM

## 2012-04-01 DIAGNOSIS — R109 Unspecified abdominal pain: Secondary | ICD-10-CM | POA: Insufficient documentation

## 2012-04-01 DIAGNOSIS — O99619 Diseases of the digestive system complicating pregnancy, unspecified trimester: Secondary | ICD-10-CM

## 2012-04-01 DIAGNOSIS — O99891 Other specified diseases and conditions complicating pregnancy: Secondary | ICD-10-CM | POA: Insufficient documentation

## 2012-04-01 LAB — URINALYSIS, ROUTINE W REFLEX MICROSCOPIC
Bilirubin Urine: NEGATIVE
Glucose, UA: NEGATIVE mg/dL
Hgb urine dipstick: NEGATIVE
Ketones, ur: 15 mg/dL — AB
Nitrite: NEGATIVE
Protein, ur: NEGATIVE mg/dL
Specific Gravity, Urine: <1.005 — ABNORMAL LOW (ref 1.005–1.030)
Urobilinogen, UA: 0.2 mg/dL (ref 0.0–1.0)
pH: 6 (ref 5.0–8.0)

## 2012-04-01 LAB — URINE MICROSCOPIC-ADD ON

## 2012-04-01 MED ORDER — HYDROCODONE-ACETAMINOPHEN 5-325 MG PO TABS: 1.0000 | ORAL_TABLET | Freq: Four times a day (QID) | ORAL | Status: DC | PRN

## 2012-04-01 MED ORDER — HYDROCODONE-ACETAMINOPHEN 5-325 MG PO TABS
2.0000 | ORAL_TABLET | Freq: Once | ORAL | Status: AC
  Administered 2012-04-01: 2 via ORAL
  Filled 2012-04-01: qty 2

## 2012-04-01 MED ORDER — BISACODYL 10 MG RE SUPP
10.0000 mg | Freq: Once | RECTAL | Status: AC
  Administered 2012-04-01: 10 mg via RECTAL
  Filled 2012-04-01: qty 1

## 2012-04-01 NOTE — MAU Note (Signed)
Lower abdominal pain x 2 days worse today, no vaginal bleeding, states has not had a bowel movement for 2 weeks has not tried anything, history of constipation with pregnancy, history of seizure disorder last one 3 months ago, states fell 2 times this past week unable to recall why.

## 2012-04-01 NOTE — MAU Provider Note (Signed)
History    CSN: [id number]  Arrival date and time: 04/01/12 1521   First Provider Initiated Contact with Patient 04/01/12 1646     Chief Complaint  Patient presents with  . Abdominal Pain  . Fall   HPI 21 y.o. G2P1001 at [redacted]w[redacted]d followed at [facility] here with report of severe constipation for two weeks with abdominal pain, and a migraine since today that is not being helped by her Imitrex.  She rates her combined pain 10/10. Denies any contractions, LOF, bleeding. Endorses good FM. No other associated symptoms.  OB History    Grav Para Term Preterm Abortions TAB SAB Ect Mult Living   2 1 1  0 0 0 0 0 0 1     Patient Active Problem List  Diagnosis  . Seizure disorder in pregnancy, antepartum  . Depression complicating pregnancy, antepartum  . Anemia  . Family history of breast cancer in mother  . High-risk pregnancy supervision  . Short interval between pregnancies complicating pregnancy, antepartum  . Previous cesarean section  . Obese    Past Medical History  Diagnosis Date  . Anemia   . Depression   . Complication of anesthesia     ??, seizure with wisdom teeth  . Headache   . Seizures     febrile  . Urinary tract infection   . Psoriasis   . PID (acute pelvic inflammatory disease)   . Chlamydia     Past Surgical History  Procedure Date  . Cesarean section   . Skin graft     off abd, onto arm  . Wisdom tooth extraction     Family History  Problem Relation Age of Onset  . Hypertension Mother   . Diabetes Mother   . Cancer Mother     BREAST  . Stroke Mother   . Seizures Mother   . Asthma Daughter   . Hypertension Maternal Grandmother   . Diabetes Maternal Grandmother   . Cancer Maternal Grandmother     BONE CANCER  . Other Neg Hx     History  Substance Use Topics  . Smoking status: Never Smoker   . Smokeless tobacco: Never Used  . Alcohol Use: No    Allergies: No Known Allergies  Prescriptions prior to admission  Medication Sig Dispense Refill    . Prenatal Vit-Fe Fumarate-FA (PRENATAL MULTIVITAMIN) TABS Take 1 tablet by mouth every morning.      . promethazine (PHENERGAN) 25 MG tablet Take 1 tablet (25 mg total) by mouth every 6 (six) hours as needed for nausea.  30 tablet  1  . sertraline (ZOLOFT) 50 MG tablet Take 1 tablet (50 mg total) by mouth daily.  30 tablet  3  . SUMAtriptan (IMITREX) 100 MG tablet Take 1 tablet (100 mg total) by mouth once as needed for migraine.  9 tablet  11    ROS Physical Exam   Blood pressure 117/78, pulse 102, temperature 98 F (36.7 C), resp. rate 18, height 5\' 6"  (1.676 m), weight 104.781 kg (231 lb), last menstrual period 09/07/2011. FHT 150s, mod variability, +accels, no decels.  Toco : no contractions Physical Exam  Constitutional: She is oriented to person, place, and time. She appears well-developed and well-nourished. No distress.  HENT:  Head: Normocephalic and atraumatic.  Eyes: EOM are normal. Pupils are equal, round, and reactive to light.  Neck: Normal range of motion. Neck supple.  GI: Soft. Bowel sounds are normal. There is tenderness.       Mild  tenderness in lower abdomen, no rebound or guarding  Musculoskeletal: Normal range of motion.  Neurological: She is alert and oriented to person, place, and time.    MAU Course  Procedures MDM Vicodin and Dulcolax given for her headache and constipation  Assessment and Plan  Severe constipation and migraine in pregnancy Relieved by administered medications Sent home with prescription for Vicodin, advised only to take Vicodin for severe pain as this also contributes to her constipation and can put her at risk for dependence. Advised to take Colace, and add Miralax or enema for worsening constipation. Patient to follow up with [facility] as scheduled; fetal movement and labor precautions reviewed.   Jaynie Collins, MD, FACOG Attending Obstetrician & Gynecologist Faculty Practice, [facility] [place]

## 2012-04-13 ENCOUNTER — Ambulatory Visit (INDEPENDENT_AMBULATORY_CARE_PROVIDER_SITE_OTHER): Payer: Medicaid Other | Admitting: Obstetrics & Gynecology

## 2012-04-13 VITALS — BP 112/65 | Temp 97.7°F | Wt 229.7 lb

## 2012-04-13 DIAGNOSIS — Z23 Encounter for immunization: Secondary | ICD-10-CM

## 2012-04-13 DIAGNOSIS — O99340 Other mental disorders complicating pregnancy, unspecified trimester: Secondary | ICD-10-CM

## 2012-04-13 DIAGNOSIS — O0990 Supervision of high risk pregnancy, unspecified, unspecified trimester: Secondary | ICD-10-CM

## 2012-04-13 DIAGNOSIS — F329 Major depressive disorder, single episode, unspecified: Secondary | ICD-10-CM

## 2012-04-13 DIAGNOSIS — F489 Nonpsychotic mental disorder, unspecified: Secondary | ICD-10-CM

## 2012-04-13 LAB — POCT URINALYSIS DIP (DEVICE)
Glucose, UA: NEGATIVE mg/dL
Hgb urine dipstick: NEGATIVE
Ketones, ur: NEGATIVE mg/dL
Nitrite: NEGATIVE
Protein, ur: 30 mg/dL — AB
Specific Gravity, Urine: 1.02 (ref 1.005–1.030)
Urobilinogen, UA: 1 mg/dL (ref 0.0–1.0)
pH: 7 (ref 5.0–8.0)

## 2012-04-13 LAB — CBC
HCT: 29.3 % — ABNORMAL LOW (ref 36.0–46.0)
Hemoglobin: 9.7 g/dL — ABNORMAL LOW (ref 12.0–15.0)
MCH: 26.3 pg (ref 26.0–34.0)
MCHC: 33.1 g/dL (ref 30.0–36.0)
MCV: 79.4 fL (ref 78.0–100.0)
Platelets: 186 10*3/uL (ref 150–400)
RBC: 3.69 MIL/uL — ABNORMAL LOW (ref 3.87–5.11)
RDW: 14.5 % (ref 11.5–15.5)
WBC: 6.3 10*3/uL (ref 4.0–10.5)

## 2012-04-13 MED ORDER — INFLUENZA VIRUS VACC SPLIT PF IM SUSP
0.5000 mL | Freq: Once | INTRAMUSCULAR | Status: AC
  Administered 2012-04-13: 0.5 mL via INTRAMUSCULAR

## 2012-04-13 MED ORDER — PRENATAL VITAMINS 0.8 MG PO TABS: 1.0000 | ORAL_TABLET | Freq: Every day | ORAL | Status: DC

## 2012-04-13 MED ORDER — HYDROCODONE-ACETAMINOPHEN 5-325 MG PO TABS: 1.0000 | ORAL_TABLET | Freq: Four times a day (QID) | ORAL | Status: DC | PRN

## 2012-04-13 MED ORDER — SERTRALINE HCL 50 MG PO TABS: 50.0000 mg | ORAL_TABLET | Freq: Every day | ORAL | Status: DC

## 2012-04-13 NOTE — Progress Notes (Signed)
P = 100 Pressure in vaginal area Patient is now living in a maternity home and does not have her medications. Will need new Rx for necessary medications.

## 2012-04-13 NOTE — Progress Notes (Signed)
Pt lost zoloft and vicodin prescriptions.  Gave one time refill.  Pt understands she will not be given more refills for lost narcotic prescriptions.  Pt should not take imitrex in pregnancy.  GCT today.  No [place] in computer and pt states she did not have an anatomy scan.  Will order today.  Pt saw neurologist and she says she will not need medication until the pregnancy is over.

## 2012-04-13 NOTE — Addendum Note (Signed)
Addended by: Sherre Lain A on: 04/13/2012 12:04 PM   Modules accepted: Orders

## 2012-04-14 ENCOUNTER — Telehealth: Payer: Self-pay | Admitting: Medical

## 2012-04-14 DIAGNOSIS — L910 Hypertrophic scar: Secondary | ICD-10-CM

## 2012-04-14 DIAGNOSIS — M79603 Pain in arm, unspecified: Secondary | ICD-10-CM

## 2012-04-14 LAB — RPR

## 2012-04-14 LAB — GLUCOSE TOLERANCE, 1 HOUR (50G) W/O FASTING: Glucose, 1 Hour GTT: 143 mg/dL — ABNORMAL HIGH (ref 70–140)

## 2012-04-14 LAB — HIV ANTIBODY (ROUTINE TESTING W REFLEX): HIV: NONREACTIVE

## 2012-04-14 NOTE — Telephone Encounter (Signed)
Attempted to contact patient, LM to return call to clinic. Patient needs PT and oral surgery referrals. Previous office visit notes do not clarify the diagnosis for these referrals. Need to discuss with patient the reasons for these referrals before the referral can be made.

## 2012-04-17 ENCOUNTER — Encounter: Payer: Self-pay | Admitting: Obstetrics & Gynecology
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
## 2012-04-17 NOTE — Telephone Encounter (Signed)
LM for patient to return call to clinic. Need information per previous note.

## 2012-04-17 NOTE — Telephone Encounter (Signed)
LM for patient with referral information. Patient has PT appointment with [facility] outpatient rehab 04/25/12@ 10:00 am she will need to arrive at 9:45 am. Patient also has appointment for consult with [facility] 04/27/12 @ 9:30 am. If patient has pregnancy medicaid and not regular medicaid she will need to pay $216 for the consult visit. I have asked the patient to return call to clinic if she cannot keep these appointments so that I can cancel or reschedule for her.

## 2012-04-18 ENCOUNTER — Telehealth: Payer: Self-pay | Admitting: *Deleted

## 2012-04-18 ENCOUNTER — Telehealth: Payer: Self-pay

## 2012-04-18 NOTE — Telephone Encounter (Signed)
Message copied by Faythe Casa on Tue Apr 18, 2012 10:59 AM ------      Message from: Lesly Dukes      Created: Mon Apr 17, 2012  3:53 PM       Needs 3 hour GTT

## 2012-04-18 NOTE — Telephone Encounter (Signed)
Called pt regarding [place] needing to be scheduled.  Pt states that she will be able to have the [place] on 10/10 and also would like to have her 3 hr GTT the same day.  I scheduled her 3hr GTT on 10/10 @ 0800 and her [place] the same day @ 1030. Pt stated that she will arrange her transportation. she understands that she will need to remain @ [facility] the entire morning.  She voiced understanding and had no questions.

## 2012-04-18 NOTE — Telephone Encounter (Signed)
Called pt and informed pt that we needed her to come in for a 3 hr glucola test due to her 1 hr being abnormal.  I explained to the pt the need for not eating or drinking after midnight the night before her appt and the need to also be here at the facility for 3 hours.  Pt stated understanding and that she would need to call to see if she could schedule transportation to come pick her up for the appt.  I advised pt to call the front desk appt line and let them know if she come Friday 04/21/12@ 0800 for the 3 hr test.  Pt stated that she would call back to schedule once she sets it up with transportation.

## 2012-04-20 ENCOUNTER — Encounter: Payer: Self-pay | Admitting: Obstetrics & Gynecology

## 2012-04-20 ENCOUNTER — Ambulatory Visit (HOSPITAL_COMMUNITY): Payer: Medicaid Other

## 2012-04-20 ENCOUNTER — Ambulatory Visit (HOSPITAL_COMMUNITY)
Admission: RE | Admit: 2012-04-20 | Discharge: 2012-04-20 | Disposition: A | Discharge status: Home / Self Care | Payer: Medicaid Other | Source: Ambulatory Visit | Attending: Obstetrics & Gynecology | Admitting: Obstetrics & Gynecology

## 2012-04-20 ENCOUNTER — Other Ambulatory Visit: Payer: Medicaid Other

## 2012-04-20 DIAGNOSIS — R7302 Impaired glucose tolerance (oral): Secondary | ICD-10-CM

## 2012-04-20 DIAGNOSIS — O0990 Supervision of high risk pregnancy, unspecified, unspecified trimester: Secondary | ICD-10-CM

## 2012-04-20 DIAGNOSIS — G40909 Epilepsy, unspecified, not intractable, without status epilepticus: Secondary | ICD-10-CM | POA: Insufficient documentation

## 2012-04-21 LAB — GLUCOSE TOLERANCE, 3 HOURS
Glucose Tolerance, 1 hour: 161 mg/dL (ref 70–189)
Glucose Tolerance, 2 hour: 120 mg/dL (ref 70–164)
Glucose Tolerance, Fasting: 83 mg/dL (ref 70–104)
Glucose, GTT - 3 Hour: 87 mg/dL (ref 70–144)

## 2012-04-25 ENCOUNTER — Ambulatory Visit: Payer: Medicaid Other | Admitting: Rehabilitation

## 2012-04-27 ENCOUNTER — Ambulatory Visit (INDEPENDENT_AMBULATORY_CARE_PROVIDER_SITE_OTHER): Payer: Medicaid Other | Admitting: Advanced Practice Midwife

## 2012-04-27 VITALS — BP 125/81 | Temp 97.1°F | Wt 228.5 lb

## 2012-04-27 DIAGNOSIS — F329 Major depressive disorder, single episode, unspecified: Secondary | ICD-10-CM

## 2012-04-27 DIAGNOSIS — O99340 Other mental disorders complicating pregnancy, unspecified trimester: Secondary | ICD-10-CM

## 2012-04-27 DIAGNOSIS — O34219 Maternal care for unspecified type scar from previous cesarean delivery: Secondary | ICD-10-CM

## 2012-04-27 DIAGNOSIS — G40909 Epilepsy, unspecified, not intractable, without status epilepticus: Secondary | ICD-10-CM

## 2012-04-27 DIAGNOSIS — D649 Anemia, unspecified: Secondary | ICD-10-CM

## 2012-04-27 LAB — POCT URINALYSIS DIP (DEVICE)
Bilirubin Urine: NEGATIVE
Glucose, UA: NEGATIVE mg/dL
Hgb urine dipstick: NEGATIVE
Ketones, ur: NEGATIVE mg/dL
Nitrite: NEGATIVE
Protein, ur: 30 mg/dL — AB
Specific Gravity, Urine: 1.02 (ref 1.005–1.030)
Urobilinogen, UA: 1 mg/dL (ref 0.0–1.0)
pH: 6.5 (ref 5.0–8.0)

## 2012-04-27 MED ORDER — SERTRALINE HCL 50 MG PO TABS: 100.0000 mg | ORAL_TABLET | Freq: Every day | ORAL | Status: DC

## 2012-04-27 MED ORDER — FERROUS SULFATE 325 (65 FE) MG PO TABS: 325.0000 mg | ORAL_TABLET | Freq: Two times a day (BID) | ORAL | Status: DC

## 2012-04-27 MED ORDER — DOCUSATE SODIUM 100 MG PO CAPS: 100.0000 mg | ORAL_CAPSULE | Freq: Two times a day (BID) | ORAL | Status: DC | PRN

## 2012-04-27 MED ORDER — BUTALBITAL-APAP-CAFFEINE 50-325-40 MG PO TABS: 1.0000 | ORAL_TABLET | Freq: Four times a day (QID) | ORAL | Status: DC | PRN

## 2012-04-27 MED ORDER — PRENATAL MULTIVITAMIN CH: 1.0000 | ORAL_TABLET | Freq: Every morning | ORAL | Status: DC

## 2012-04-27 NOTE — Progress Notes (Signed)
Pulse 104 Pt concerned because of increased thirst and eating ice. She is not eating much food.  Pt needs a note stating that she can take the zoloft 50mg , 2 tablets daily.

## 2012-04-27 NOTE — Progress Notes (Signed)
Doing well.  Good fetal movement, denies vaginal bleeding, LOF, regular contractions. Reports fatigue, pica symptoms for crushed ice.  Zoloft increased to 100 mg daily, Vicodin prescription not renewed, Fioricet Q6 hours for h/a, new prenatal vitamin prescription and ferrous sulfate 325 mg BID. Pt to take prenatal and iron when possible, but if does not tolerate prenatal, take iron only.  Colace BID prescription for chronic constipation.

## 2012-05-02 ENCOUNTER — Ambulatory Visit: Payer: Medicaid Other | Attending: Obstetrics & Gynecology | Admitting: Physical Therapy

## 2012-05-02 DIAGNOSIS — IMO0001 Reserved for inherently not codable concepts without codable children: Secondary | ICD-10-CM | POA: Insufficient documentation

## 2012-05-02 DIAGNOSIS — M2550 Pain in unspecified joint: Secondary | ICD-10-CM | POA: Insufficient documentation

## 2012-05-02 DIAGNOSIS — M2560 Stiffness of unspecified joint, not elsewhere classified: Secondary | ICD-10-CM | POA: Insufficient documentation

## 2012-05-06 ENCOUNTER — Encounter (HOSPITAL_COMMUNITY): Payer: Self-pay | Admitting: *Deleted

## 2012-05-06 ENCOUNTER — Inpatient Hospital Stay (HOSPITAL_COMMUNITY)
Admission: AD | Admit: 2012-05-06 | Discharge: 2012-05-06 | Disposition: A | Discharge status: Home / Self Care | Payer: Medicaid Other | Source: Ambulatory Visit | Attending: Obstetrics & Gynecology | Admitting: Obstetrics & Gynecology

## 2012-05-06 DIAGNOSIS — M545 Low back pain, unspecified: Secondary | ICD-10-CM | POA: Insufficient documentation

## 2012-05-06 DIAGNOSIS — O99891 Other specified diseases and conditions complicating pregnancy: Secondary | ICD-10-CM | POA: Insufficient documentation

## 2012-05-06 DIAGNOSIS — R51 Headache: Secondary | ICD-10-CM

## 2012-05-06 DIAGNOSIS — O26899 Other specified pregnancy related conditions, unspecified trimester: Secondary | ICD-10-CM

## 2012-05-06 LAB — URINALYSIS, ROUTINE W REFLEX MICROSCOPIC
Bilirubin Urine: NEGATIVE
Glucose, UA: NEGATIVE mg/dL
Hgb urine dipstick: NEGATIVE
Ketones, ur: NEGATIVE mg/dL
Nitrite: NEGATIVE
Protein, ur: NEGATIVE mg/dL
Specific Gravity, Urine: 1.02 (ref 1.005–1.030)
Urobilinogen, UA: 0.2 mg/dL (ref 0.0–1.0)
pH: 7 (ref 5.0–8.0)

## 2012-05-06 LAB — URINE MICROSCOPIC-ADD ON

## 2012-05-06 MED ORDER — CYCLOBENZAPRINE HCL 10 MG PO TABS
10.0000 mg | ORAL_TABLET | Freq: Once | ORAL | Status: AC
  Administered 2012-05-06: 10 mg via ORAL
  Filled 2012-05-06: qty 1

## 2012-05-06 MED ORDER — ACETAMINOPHEN-CODEINE #3 300-30 MG PO TABS
2.0000 | ORAL_TABLET | Freq: Once | ORAL | Status: AC
  Administered 2012-05-06: 2 via ORAL
  Filled 2012-05-06: qty 2

## 2012-05-06 MED ORDER — ACETAMINOPHEN-CODEINE 300-30 MG PO TABS: 1.0000 | ORAL_TABLET | ORAL | Status: DC | PRN

## 2012-05-06 NOTE — MAU Provider Note (Signed)
History     CSN: [id number]  Arrival date and time: 05/06/12 1256   First Provider Initiated Contact with Patient 05/06/12 1359      Chief Complaint  Patient presents with  . Headache   HPI  Pt is a G2P1001 at 34.4 wks IUP here with report of headache and lower back pain.  Pt reports frontal headache, similar to migraine.  Did not try Fioricet at home.  Denies nausea, vomiting, or visual changes.  Pt also stated having intermittent back pain with contractions.  Uncertain of the frequency.  Denies vaginal bleeding or leaking of fluid.    Past Medical History  Diagnosis Date  . Anemia   . Depression   . Complication of anesthesia     ??, seizure with wisdom teeth  . Headache   . Urinary tract infection   . Psoriasis   . PID (acute pelvic inflammatory disease)   . Chlamydia   . Seizures     July 2013 - [place]    Past Surgical History  Procedure Date  . Cesarean section   . Skin graft     off abd, onto arm  . Wisdom tooth extraction     Family History  Problem Relation Age of Onset  . Hypertension Mother   . Diabetes Mother   . Cancer Mother     BREAST  . Stroke Mother   . Seizures Mother   . Asthma Daughter   . Hypertension Maternal Grandmother   . Diabetes Maternal Grandmother   . Cancer Maternal Grandmother     BONE CANCER  . Other Neg Hx     History  Substance Use Topics  . Smoking status: Never Smoker   . Smokeless tobacco: Never Used  . Alcohol Use: No    Allergies: No Known Allergies  Prescriptions prior to admission  Medication Sig Dispense Refill  . acetaminophen (TYLENOL) 325 MG tablet Take 325 mg by mouth every 6 (six) hours as needed. pain      . butalbital-acetaminophen-caffeine (FIORICET) 50-325-40 MG per tablet Take 1-2 tablets by mouth every 6 (six) hours as needed for headache.  20 tablet  0  . docusate sodium (COLACE) 100 MG capsule Take 1 capsule (100 mg total) by mouth 2 (two) times daily as needed for constipation.  30  capsule  2  . ferrous sulfate (FERROUSUL) 325 (65 FE) MG tablet Take 1 tablet (325 mg total) by mouth 2 (two) times daily.  60 tablet  1  . HYDROcodone-acetaminophen (NORCO/VICODIN) 5-325 MG per tablet Take 1 tablet by mouth every 6 (six) hours as needed for pain (migraine not relieved by imitrex).  15 tablet  0  . Prenatal Vit-Fe Fumarate-FA (PRENATAL MULTIVITAMIN) TABS Take 1 tablet by mouth every morning.  30 tablet  5  . sertraline (ZOLOFT) 50 MG tablet Take 2 tablets (100 mg total) by mouth daily.  60 tablet  3    Review of Systems  Constitutional: Negative.   Eyes: Negative.   Respiratory: Negative.   Cardiovascular: Negative.   Gastrointestinal: Negative.   Genitourinary: Negative.   Musculoskeletal: Positive for back pain.  Neurological: Positive for headaches.   Physical Exam   Blood pressure 114/50, pulse 120, temperature 97.7 F (36.5 C), temperature source Oral, resp. rate 18, height 5\' 6"  (1.676 m), weight 104.418 kg (230 lb 3.2 oz), last menstrual period 09/07/2011, unknown if currently breastfeeding.  Physical Exam  Constitutional: She is oriented to person, place, and time. She appears well-developed and well-nourished.  No distress.  HENT:  Head: Normocephalic.  Neck: Normal range of motion. Neck supple.  Cardiovascular: Normal rate, regular rhythm and normal heart sounds.   Respiratory: Effort normal and breath sounds normal.  GI: Soft. There is no tenderness.  Genitourinary: No bleeding around the vagina. Vaginal discharge (mucusy) found.       Cervix - closed/thick  Musculoskeletal: Normal range of motion. She exhibits edema (trace, bilat pedal edema).  Neurological: She is alert and oriented to person, place, and time.  Skin: Skin is warm and dry.    MAU Course  Procedures  Flexeril - minimal relief Tylenol #3 - pt reports improvement in pain  Assessment and Plan  Headache in Pregnancy - normotensive  Plan: DC to home Tylenol#3 (15) DC Lortab and  Fioricet Keep scheduled appointment.  [facility] 05/06/2012, 2:46 PM

## 2012-05-06 NOTE — MAU Note (Signed)
Pt c/o having a headache for several days taking a "white" pil the doctor prescribed for it. C/O mild contractions and back pain as well.

## 2012-05-10 ENCOUNTER — Encounter: Payer: Medicaid Other | Admitting: Physical Therapy

## 2012-05-11 ENCOUNTER — Ambulatory Visit (INDEPENDENT_AMBULATORY_CARE_PROVIDER_SITE_OTHER): Payer: Medicaid Other | Admitting: Family Medicine

## 2012-05-11 ENCOUNTER — Encounter: Payer: Medicaid Other | Admitting: Family Medicine

## 2012-05-11 ENCOUNTER — Other Ambulatory Visit: Payer: Self-pay | Admitting: Family Medicine

## 2012-05-11 ENCOUNTER — Other Ambulatory Visit (HOSPITAL_COMMUNITY)
Admission: RE | Admit: 2012-05-11 | Discharge: 2012-05-11 | Disposition: A | Discharge status: Home / Self Care | Payer: Medicaid Other | Source: Ambulatory Visit | Attending: Family Medicine | Admitting: Family Medicine

## 2012-05-11 VITALS — BP 100/65 | Temp 97.1°F | Wt 231.5 lb

## 2012-05-11 DIAGNOSIS — O26899 Other specified pregnancy related conditions, unspecified trimester: Secondary | ICD-10-CM

## 2012-05-11 DIAGNOSIS — R300 Dysuria: Secondary | ICD-10-CM

## 2012-05-11 DIAGNOSIS — N898 Other specified noninflammatory disorders of vagina: Secondary | ICD-10-CM

## 2012-05-11 DIAGNOSIS — O09899 Supervision of other high risk pregnancies, unspecified trimester: Secondary | ICD-10-CM

## 2012-05-11 DIAGNOSIS — O9989 Other specified diseases and conditions complicating pregnancy, childbirth and the puerperium: Secondary | ICD-10-CM

## 2012-05-11 DIAGNOSIS — O0990 Supervision of high risk pregnancy, unspecified, unspecified trimester: Secondary | ICD-10-CM

## 2012-05-11 DIAGNOSIS — Z113 Encounter for screening for infections with a predominantly sexual mode of transmission: Secondary | ICD-10-CM | POA: Insufficient documentation

## 2012-05-11 DIAGNOSIS — N760 Acute vaginitis: Secondary | ICD-10-CM | POA: Insufficient documentation

## 2012-05-11 LAB — POCT URINALYSIS DIP (DEVICE)
Glucose, UA: NEGATIVE mg/dL
Hgb urine dipstick: NEGATIVE
Ketones, ur: NEGATIVE mg/dL
Nitrite: NEGATIVE
Protein, ur: 30 mg/dL — AB
Specific Gravity, Urine: 1.02 (ref 1.005–1.030)
Urobilinogen, UA: 1 mg/dL (ref 0.0–1.0)
pH: 7 (ref 5.0–8.0)

## 2012-05-11 NOTE — Progress Notes (Signed)
Burning with urination, watery discharge with odor. GBS, GC done today. Urine with trace leukocytes - sent for culture. Very tender with speculum exam. Watery and thick white discharge, introitus and vagina very red. Wet prep sent.

## 2012-05-11 NOTE — Patient Instructions (Signed)
Pregnancy - Third Trimester  The third trimester of pregnancy (the last 3 months) is a period of the most rapid growth for you and your baby. The baby approaches a length of 20 inches and a weight of 6 to 10 pounds. The baby is adding on fat and getting ready for life outside your body. While inside, babies have periods of sleeping and waking, suck their thumbs, and hiccups. You can often feel small contractions of the uterus. This is false labor. It is also called Braxton-Hicks contractions. This is like a practice for labor. The usual problems in this stage of pregnancy include more difficulty breathing, swelling of the hands and feet from water retention, and having to urinate more often because of the uterus and baby pressing on your bladder.   PRENATAL EXAMS  · Blood work may continue to be done during prenatal exams. These tests are done to check on your health and the probable health of your baby. Blood work is used to follow your blood levels (hemoglobin). Anemia (low hemoglobin) is common during pregnancy. Iron and vitamins are given to help prevent this. You may also continue to be checked for diabetes. Some of the past blood tests may be done again.  · The size of the uterus is measured during each visit. This makes sure your baby is growing properly according to your pregnancy dates.  · Your blood pressure is checked every prenatal visit. This is to make sure you are not getting toxemia.  · Your urine is checked every prenatal visit for infection, diabetes and protein.  · Your weight is checked at each visit. This is done to make sure gains are happening at the suggested rate and that you and your baby are growing normally.  · Sometimes, an ultrasound is performed to confirm the position and the proper growth and development of the baby. This is a test done that bounces harmless sound waves off the baby so your caregiver can more accurately determine due dates.  · Discuss the type of pain medication and  anesthesia you will have during your labor and delivery.  · Discuss the possibility and anesthesia if a Cesarean Section might be necessary.  · Inform your caregiver if there is any mental or physical violence at home.  Sometimes, a specialized non-stress test, contraction stress test and biophysical profile are done to make sure the baby is not having a problem. Checking the amniotic fluid surrounding the baby is called an amniocentesis. The amniotic fluid is removed by sticking a needle into the belly (abdomen). This is sometimes done near the end of pregnancy if an early delivery is required. In this case, it is done to help make sure the baby's lungs are mature enough for the baby to live outside of the womb. If the lungs are not mature and it is unsafe to deliver the baby, an injection of cortisone medication is given to the mother 1 to 2 days before the delivery. This helps the baby's lungs mature and makes it safer to deliver the baby.  CHANGES OCCURING IN THE THIRD TRIMESTER OF PREGNANCY  Your body goes through many changes during pregnancy. They vary from person to person. Talk to your caregiver about changes you notice and are concerned about.  · During the last trimester, you have probably had an increase in your appetite. It is normal to have cravings for certain foods. This varies from person to person and pregnancy to pregnancy.  · You may begin to   get stretch marks on your hips, abdomen, and breasts. These are normal changes in the body during pregnancy. There are no exercises or medications to take which prevent this change.  · Constipation may be treated with a stool softener or adding bulk to your diet. Drinking lots of fluids, fiber in vegetables, fruits, and whole grains are helpful.  · Exercising is also helpful. If you have been very active up until your pregnancy, most of these activities can be continued during your pregnancy. If you have been less active, it is helpful to start an exercise  program such as walking. Consult your caregiver before starting exercise programs.  · Avoid all smoking, alcohol, un-prescribed drugs, herbs and "street drugs" during your pregnancy. These chemicals affect the formation and growth of the baby. Avoid chemicals throughout the pregnancy to ensure the delivery of a healthy infant.  · Backache, varicose veins and hemorrhoids may develop or get worse.  · You will tire more easily in the third trimester, which is normal.  · The baby's movements may be stronger and more often.  · You may become short of breath easily.  · Your belly button may stick out.  · A yellow discharge may leak from your breasts called colostrum.  · You may have a bloody mucus discharge. This usually occurs a few days to a week before labor begins.  HOME CARE INSTRUCTIONS   · Keep your caregiver's appointments. Follow your caregiver's instructions regarding medication use, exercise, and diet.  · During pregnancy, you are providing food for you and your baby. Continue to eat regular, well-balanced meals. Choose foods such as meat, fish, milk and other low fat dairy products, vegetables, fruits, and whole-grain breads and cereals. Your caregiver will tell you of the ideal weight gain.  · A physical sexual relationship may be continued throughout pregnancy if there are no other problems such as early (premature) leaking of amniotic fluid from the membranes, vaginal bleeding, or belly (abdominal) pain.  · Exercise regularly if there are no restrictions. Check with your caregiver if you are unsure of the safety of your exercises. Greater weight gain will occur in the last 2 trimesters of pregnancy. Exercising helps:  · Control your weight.  · Get you in shape for labor and delivery.  · You lose weight after you deliver.  · Rest a lot with legs elevated, or as needed for leg cramps or low back pain.  · Wear a good support or jogging bra for breast tenderness during pregnancy. This may help if worn during  sleep. Pads or tissues may be used in the bra if you are leaking colostrum.  · Do not use hot tubs, steam rooms, or saunas.  · Wear your seat belt when driving. This protects you and your baby if you are in an accident.  · Avoid raw meat, cat litter boxes and soil used by cats. These carry germs that can cause birth defects in the baby.  · It is easier to loose urine during pregnancy. Tightening up and strengthening the pelvic muscles will help with this problem. You can practice stopping your urination while you are going to the bathroom. These are the same muscles you need to strengthen. It is also the muscles you would use if you were trying to stop from passing gas. You can practice tightening these muscles up 10 times a set and repeating this about 3 times per day. Once you know what muscles to tighten up, do not perform these   exercises during urination. It is more likely to cause an infection by backing up the urine.  · Ask for help if you have financial, counseling or nutritional needs during pregnancy. Your caregiver will be able to offer counseling for these needs as well as refer you for other special needs.  · Make a list of emergency phone numbers and have them available.  · Plan on getting help from family or friends when you go home from the hospital.  · Make a trial run to the hospital.  · Take prenatal classes with the father to understand, practice and ask questions about the labor and delivery.  · Prepare the baby's room/nursery.  · Do not travel out of the city unless it is absolutely necessary and with the advice of your caregiver.  · Wear only low or no heal shoes to have better balance and prevent falling.  MEDICATIONS AND DRUG USE IN PREGNANCY  · Take prenatal vitamins as directed. The vitamin should contain 1 milligram of folic acid. Keep all vitamins out of reach of children. Only a couple vitamins or tablets containing iron may be fatal to a baby or young child when ingested.  · Avoid use  of all medications, including herbs, over-the-counter medications, not prescribed or suggested by your caregiver. Only take over-the-counter or prescription medicines for pain, discomfort, or fever as directed by your caregiver. Do not use aspirin, ibuprofen (Motrin®, Advil®, Nuprin®) or naproxen (Aleve®) unless OK'd by your caregiver.  · Let your caregiver also know about herbs you may be using.  · Alcohol is related to a number of birth defects. This includes fetal alcohol syndrome. All alcohol, in any form, should be avoided completely. Smoking will cause low birth rate and premature babies.  · Street/illegal drugs are very harmful to the baby. They are absolutely forbidden. A baby born to an addicted mother will be addicted at birth. The baby will go through the same withdrawal an adult does.  SEEK MEDICAL CARE IF:  You have any concerns or worries during your pregnancy. It is better to call with your questions if you feel they cannot wait, rather than worry about them.  DECISIONS ABOUT CIRCUMCISION  You may or may not know the sex of your baby. If you know your baby is a boy, it may be time to think about circumcision. Circumcision is the removal of the foreskin of the penis. This is the skin that covers the sensitive end of the penis. There is no proven medical need for this. Often this decision is made on what is popular at the time or based upon religious beliefs and social issues. You can discuss these issues with your caregiver or pediatrician.  SEEK IMMEDIATE MEDICAL CARE IF:   · An unexplained oral temperature above 102° F (38.9° C) develops, or as your caregiver suggests.  · You have leaking of fluid from the vagina (birth canal). If leaking membranes are suspected, take your temperature and tell your caregiver of this when you call.  · There is vaginal spotting, bleeding or passing clots. Tell your caregiver of the amount and how many pads are used.  · You develop a bad smelling vaginal discharge with  a change in the color from clear to white.  · You develop vomiting that lasts more than 24 hours.  · You develop chills or fever.  · You develop shortness of breath.  · You develop burning on urination.  · You loose more than 2 pounds of weight   or gain more than 2 pounds of weight or as suggested by your caregiver.  · You notice sudden swelling of your face, hands, and feet or legs.  · You develop belly (abdominal) pain. Round ligament discomfort is a common non-cancerous (benign) cause of abdominal pain in pregnancy. Your caregiver still must evaluate you.  · You develop a severe headache that does not go away.  · You develop visual problems, blurred or double vision.  · If you have not felt your baby move for more than 1 hour. If you think the baby is not moving as much as usual, eat something with sugar in it and lie down on your left side for an hour. The baby should move at least 4 to 5 times per hour. Call right away if your baby moves less than that.  · You fall, are in a car accident or any kind of trauma.  · There is mental or physical violence at home.  Document Released: 06/22/2001 Document Revised: 09/20/2011 Document Reviewed: 12/25/2008  [employer]® Patient Information ©2013 [employer].

## 2012-05-11 NOTE — Progress Notes (Signed)
P = 112 Pain/pressure in lower abdomen

## 2012-05-12 ENCOUNTER — Other Ambulatory Visit: Payer: Self-pay | Admitting: Family Medicine

## 2012-05-12 ENCOUNTER — Ambulatory Visit: Payer: Medicaid Other | Admitting: Occupational Therapy

## 2012-05-14 LAB — CULTURE, OB URINE: Colony Count: 85000

## 2012-05-14 LAB — CULTURE, BETA STREP (GROUP B ONLY)

## 2012-05-16 ENCOUNTER — Ambulatory Visit: Payer: Medicaid Other | Attending: Obstetrics & Gynecology | Admitting: Physical Therapy

## 2012-05-16 DIAGNOSIS — M2550 Pain in unspecified joint: Secondary | ICD-10-CM | POA: Insufficient documentation

## 2012-05-16 DIAGNOSIS — M2560 Stiffness of unspecified joint, not elsewhere classified: Secondary | ICD-10-CM | POA: Insufficient documentation

## 2012-05-16 DIAGNOSIS — IMO0001 Reserved for inherently not codable concepts without codable children: Secondary | ICD-10-CM | POA: Insufficient documentation

## 2012-05-17 ENCOUNTER — Encounter: Payer: Medicaid Other | Admitting: Physical Therapy

## 2012-05-18 ENCOUNTER — Telehealth: Payer: Self-pay | Admitting: *Deleted

## 2012-05-18 ENCOUNTER — Ambulatory Visit (INDEPENDENT_AMBULATORY_CARE_PROVIDER_SITE_OTHER): Payer: Medicaid Other | Admitting: Advanced Practice Midwife

## 2012-05-18 VITALS — BP 98/50 | Temp 97.2°F | Wt 233.5 lb

## 2012-05-18 DIAGNOSIS — A499 Bacterial infection, unspecified: Secondary | ICD-10-CM

## 2012-05-18 DIAGNOSIS — O0990 Supervision of high risk pregnancy, unspecified, unspecified trimester: Secondary | ICD-10-CM

## 2012-05-18 DIAGNOSIS — B9689 Other specified bacterial agents as the cause of diseases classified elsewhere: Secondary | ICD-10-CM

## 2012-05-18 DIAGNOSIS — G40909 Epilepsy, unspecified, not intractable, without status epilepticus: Secondary | ICD-10-CM

## 2012-05-18 DIAGNOSIS — N760 Acute vaginitis: Secondary | ICD-10-CM

## 2012-05-18 LAB — POCT URINALYSIS DIP (DEVICE)
Glucose, UA: NEGATIVE mg/dL
Hgb urine dipstick: NEGATIVE
Ketones, ur: NEGATIVE mg/dL
Nitrite: NEGATIVE
Protein, ur: 100 mg/dL — AB
Specific Gravity, Urine: 1.02 (ref 1.005–1.030)
Urobilinogen, UA: 2 mg/dL — ABNORMAL HIGH (ref 0.0–1.0)
pH: 7 (ref 5.0–8.0)

## 2012-05-18 MED ORDER — METRONIDAZOLE 500 MG PO TABS: 500.0000 mg | ORAL_TABLET | Freq: Two times a day (BID) | ORAL | Status: DC

## 2012-05-18 NOTE — Progress Notes (Signed)
Pulse:88 Has some itching and odor with white discharge.

## 2012-05-18 NOTE — Telephone Encounter (Signed)
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 Please fax to [phone] , may call shelter at [phone] if you have questions or call [place] at [phone]

## 2012-05-18 NOTE — Progress Notes (Signed)
Needs Rx Flagyl for BV. C/S requested for 39 weeks 06/06/12.

## 2012-05-18 NOTE — Patient Instructions (Signed)
Upper Respiratory Infection, Adult An upper respiratory infection (URI) is also known as the common cold. It is often caused by a type of germ (virus). Colds are easily spread (contagious). You can pass it to others by kissing, coughing, sneezing, or drinking out of the same glass. Usually, you get better in 1 or 2 weeks.  HOME CARE   Only take medicine as told by your doctor.  Use a warm mist humidifier or breathe in steam from a hot shower.  Drink enough water and fluids to keep your pee (urine) clear or pale yellow.  Get plenty of rest.  Return to work when your temperature is back to normal or as told by your doctor. You may use a face mask and wash your hands to stop your cold from spreading. GET HELP RIGHT AWAY IF:   After the first few days, you feel you are getting worse.  You have questions about your medicine.  You have chills, shortness of breath, or brown or red spit (mucus).  You have yellow or brown snot (nasal discharge) or pain in the face, especially when you bend forward.  You have a fever, puffy (swollen) neck, pain when you swallow, or white spots in the back of your throat.  You have a bad headache, ear pain, sinus pain, or chest pain.  You have a high-pitched whistling sound when you breathe in and out (wheezing).  You have a lasting cough or cough up blood.  You have sore muscles or a stiff neck. MAKE SURE YOU:   Understand these instructions.  Will watch your condition.  Will get help right away if you are not doing well or get worse. Document Released: 12/15/2007 Document Revised: 09/20/2011 Document Reviewed: 11/02/2010 [facility] Patient Information 2013 [place], [place].  Sudafed (Congestion) Mucinex (cough) -Caine (sore throat)  Fetal Movement Counts Patient Name: __________________________________________________ Patient Due Date: ____________________ Melody Haver counts is highly recommended in high risk pregnancies, but it is a good idea for every  pregnant woman to do. Start counting fetal movements at 28 weeks of the pregnancy. Fetal movements increase after eating a full meal or eating or drinking something sweet (the blood sugar is higher). It is also important to drink plenty of fluids (well hydrated) before doing the count. Lie on your left side because it helps with the circulation or you can sit in a comfortable chair with your arms over your belly (abdomen) with no distractions around you. DOING THE COUNT  Try to do the count the same time of day each time you do it.  Mark the day and time, then see how long it takes for you to feel 10 movements (kicks, flutters, swishes, rolls). You should have at least 10 movements within 2 hours. You will most likely feel 10 movements in much less than 2 hours. If you do not, wait an hour and count again. After a couple of days you will see a pattern.  What you are looking for is a change in the pattern or not enough counts in 2 hours. Is it taking longer in time to reach 10 movements? SEEK MEDICAL CARE IF:  You feel less than 10 counts in 2 hours. Tried twice.  No movement in one hour.  The pattern is changing or taking longer each day to reach 10 counts in 2 hours.  You feel the baby is not moving as it usually does. Date: ____________ Movements: ____________ Start time: ____________ Doreatha Martin time: ____________  Date: ____________ Movements: ____________ Start  time: ____________ Doreatha Martin time: ____________ Date: ____________ Movements: ____________ Start time: ____________ Doreatha Martin time: ____________ Date: ____________ Movements: ____________ Start time: ____________ Doreatha Martin time: ____________ Date: ____________ Movements: ____________ Start time: ____________ Doreatha Martin time: ____________ Date: ____________ Movements: ____________ Start time: ____________ Doreatha Martin time: ____________ Date: ____________ Movements: ____________ Start time: ____________ Doreatha Martin time: ____________ Date: ____________  Movements: ____________ Start time: ____________ Doreatha Martin time: ____________  Date: ____________ Movements: ____________ Start time: ____________ Doreatha Martin time: ____________ Date: ____________ Movements: ____________ Start time: ____________ Doreatha Martin time: ____________ Date: ____________ Movements: ____________ Start time: ____________ Doreatha Martin time: ____________ Date: ____________ Movements: ____________ Start time: ____________ Doreatha Martin time: ____________ Date: ____________ Movements: ____________ Start time: ____________ Doreatha Martin time: ____________ Date: ____________ Movements: ____________ Start time: ____________ Doreatha Martin time: ____________ Date: ____________ Movements: ____________ Start time: ____________ Doreatha Martin time: ____________  Date: ____________ Movements: ____________ Start time: ____________ Doreatha Martin time: ____________ Date: ____________ Movements: ____________ Start time: ____________ Doreatha Martin time: ____________ Date: ____________ Movements: ____________ Start time: ____________ Doreatha Martin time: ____________ Date: ____________ Movements: ____________ Start time: ____________ Doreatha Martin time: ____________ Date: ____________ Movements: ____________ Start time: ____________ Doreatha Martin time: ____________ Date: ____________ Movements: ____________ Start time: ____________ Doreatha Martin time: ____________ Date: ____________ Movements: ____________ Start time: ____________ Doreatha Martin time: ____________  Date: ____________ Movements: ____________ Start time: ____________ Doreatha Martin time: ____________ Date: ____________ Movements: ____________ Start time: ____________ Doreatha Martin time: ____________ Date: ____________ Movements: ____________ Start time: ____________ Doreatha Martin time: ____________ Date: ____________ Movements: ____________ Start time: ____________ Doreatha Martin time: ____________ Date: ____________ Movements: ____________ Start time: ____________ Doreatha Martin time: ____________ Date: ____________ Movements: ____________ Start time:  ____________ Doreatha Martin time: ____________ Date: ____________ Movements: ____________ Start time: ____________ Doreatha Martin time: ____________  Date: ____________ Movements: ____________ Start time: ____________ Doreatha Martin time: ____________ Date: ____________ Movements: ____________ Start time: ____________ Doreatha Martin time: ____________ Date: ____________ Movements: ____________ Start time: ____________ Doreatha Martin time: ____________ Date: ____________ Movements: ____________ Start time: ____________ Doreatha Martin time: ____________ Date: ____________ Movements: ____________ Start time: ____________ Doreatha Martin time: ____________ Date: ____________ Movements: ____________ Start time: ____________ Doreatha Martin time: ____________ Date: ____________ Movements: ____________ Start time: ____________ Doreatha Martin time: ____________  Date: ____________ Movements: ____________ Start time: ____________ Doreatha Martin time: ____________ Date: ____________ Movements: ____________ Start time: ____________ Doreatha Martin time: ____________ Date: ____________ Movements: ____________ Start time: ____________ Doreatha Martin time: ____________ Date: ____________ Movements: ____________ Start time: ____________ Doreatha Martin time: ____________ Date: ____________ Movements: ____________ Start time: ____________ Doreatha Martin time: ____________ Date: ____________ Movements: ____________ Start time: ____________ Doreatha Martin time: ____________ Date: ____________ Movements: ____________ Start time: ____________ Doreatha Martin time: ____________  Date: ____________ Movements: ____________ Start time: ____________ Doreatha Martin time: ____________ Date: ____________ Movements: ____________ Start time: ____________ Doreatha Martin time: ____________ Date: ____________ Movements: ____________ Start time: ____________ Doreatha Martin time: ____________ Date: ____________ Movements: ____________ Start time: ____________ Doreatha Martin time: ____________ Date: ____________ Movements: ____________ Start time: ____________ Doreatha Martin time: ____________ Date:  ____________ Movements: ____________ Start time: ____________ Doreatha Martin time: ____________ Date: ____________ Movements: ____________ Start time: ____________ Doreatha Martin time: ____________  Date: ____________ Movements: ____________ Start time: ____________ Doreatha Martin time: ____________ Date: ____________ Movements: ____________ Start time: ____________ Doreatha Martin time: ____________ Date: ____________ Movements: ____________ Start time: ____________ Doreatha Martin time: ____________ Date: ____________ Movements: ____________ Start time: ____________ Doreatha Martin time: ____________ Date: ____________ Movements: ____________ Start time: ____________ Doreatha Martin time: ____________ Date: ____________ Movements: ____________ Start time: ____________ Doreatha Martin time: ____________ Document Released: 07/28/2006 Document Revised: 09/20/2011 Document Reviewed: 01/28/2009 [employer] Patient Information 2013 [place].  Cesarean Delivery  Cesarean delivery is the birth of a baby through a cut (incision) in the abdomen and womb (uterus).  LET YOUR CAREGIVER KNOW ABOUT:  Complicationsinvolving  the pregnancy.  Allergies.  Medicines taken including herbs, eyedrops, over-the-counter medicines, and creams.  Use of steroids (by mouth or creams).  Previous problems with anesthetics or numbing medicine.  Previous surgery.  History of blood clots.  History of bleeding or blood problems.  Other health problems. RISKS AND COMPLICATIONS   Bleeding.  Infection.  Blood clots.  Injury to surrounding organs.  Anesthesia problems.  Injury to the baby. BEFORE THE PROCEDURE   A tube (Foley catheter) will be placed in your bladder. The Foley catheter drains the urine from your bladder into a bag. This keeps your bladder empty during surgery.  An intravenous access tube (IV) will be placed in your arm.  Hair may be removed from your pubic area and your lower abdomen. This is to prevent infection in the incision site.  You may be  given an antacid medicine to drink. This will prevent acid contents in your stomach from going into your lungs if you vomit during the surgery.  You may be given an antibiotic medicine to prevent infection. PROCEDURE   You may be given medicine to numb the lower half of your body (regional anesthetic). If you were in labor, you may have already had an epidural in place which can be used in both labor and cesarean delivery. You may possibly be given medicine to make you sleep (general anesthetic) though this is not as common.  An incision will be made in your abdomen that extends to your uterus. There are 2 basic kinds of incisions:  The horizontal (transverse) incision. Horizontal incisions are used for most routine cesarean deliveries.  The vertical (up and down) incision. This is less commonly used. This is most often reserved for women who have a serious complication (extreme prematurity) or under emergency situations.  The horizontal and vertical incisions may both be used at the same time. However, this is very uncommon.  Your baby will then be delivered. AFTER THE PROCEDURE   If you were awake during the surgery, you will see your baby right away. If you were asleep, you will see your baby as soon as you are awake.  You may breastfeed your baby after surgery.  You may be able to get up and walk the same day as the surgery. If you need to stay in bed for a period of time, you will receive help to turn, cough, and take deep breaths after surgery. This helps prevent lung problems such as pneumonia.  Do not get out of bed alone the first time after surgery. You will need help getting out of bed until you are able to do this by yourself.  You may be able to shower the day after your cesarean delivery. After the bandage (dressing) is taken off the incision site, a nurse will assist you to shower, if you like.  You will have pneumatic compressing hose placed on your feet or lower legs.  These hose are used to prevent blood clots. When you are up and walking regularly, they will no longer be necessary.  Do not cross your legs when you sit.  Save any blood clots that you pass. If you pass a clot while on the toilet, do not flush it. Call for the nurse. Tell the nurse if you think you are bleeding too much or passing too many clots.  Start drinking liquids and eating food as directed by your caregiver. If your stomach is not ready, drinking and eating too soon can cause an increase  in bloating and swelling of your intestine and abdomen. This is very uncomfortable.  You will be given medicine as needed. Let your caregivers know if you are hurting. They want you to be comfortable. You may also be given an antibiotic to prevent an infection.  Your IV will be taken out when you are drinking a reasonable amount of fluids. The Foley catheter is taken out when you are up and walking.  If your blood type is Rh negative and your baby's blood type is Rh positive, you will be given a shot of anti-D immune globulin. This shot prevents you from having Rh problems with a future pregnancy. You should get the shot even if you had your tubes tied (tubal ligation).  If you are allowed to take the baby for a walk, place the baby in the bassinet and push it. Do not carry your baby in your arms. Document Released: 06/28/2005 Document Revised: 09/20/2011 Document Reviewed: 10/23/2010 [facility] Patient Information 2013 [place], [place].

## 2012-05-19 ENCOUNTER — Other Ambulatory Visit: Payer: Self-pay | Admitting: Obstetrics & Gynecology

## 2012-05-20 ENCOUNTER — Encounter (HOSPITAL_COMMUNITY): Payer: Self-pay | Admitting: *Deleted

## 2012-05-20 ENCOUNTER — Inpatient Hospital Stay (HOSPITAL_COMMUNITY)
Admission: AD | Admit: 2012-05-20 | Discharge: 2012-05-20 | Disposition: A | Discharge status: Home / Self Care | Payer: Medicaid Other | Source: Ambulatory Visit | Attending: Obstetrics & Gynecology | Admitting: Obstetrics & Gynecology

## 2012-05-20 ENCOUNTER — Inpatient Hospital Stay (HOSPITAL_COMMUNITY): Admission: AD | Admit: 2012-05-20 | Payer: Self-pay | Source: Ambulatory Visit | Admitting: Obstetrics and Gynecology

## 2012-05-20 DIAGNOSIS — K529 Noninfective gastroenteritis and colitis, unspecified: Secondary | ICD-10-CM

## 2012-05-20 DIAGNOSIS — O99891 Other specified diseases and conditions complicating pregnancy: Secondary | ICD-10-CM | POA: Insufficient documentation

## 2012-05-20 DIAGNOSIS — O212 Late vomiting of pregnancy: Secondary | ICD-10-CM | POA: Insufficient documentation

## 2012-05-20 DIAGNOSIS — K5289 Other specified noninfective gastroenteritis and colitis: Secondary | ICD-10-CM | POA: Insufficient documentation

## 2012-05-20 DIAGNOSIS — R197 Diarrhea, unspecified: Secondary | ICD-10-CM | POA: Insufficient documentation

## 2012-05-20 MED ORDER — PROMETHAZINE HCL 25 MG PO TABS: 25.0000 mg | ORAL_TABLET | Freq: Four times a day (QID) | ORAL | Status: DC | PRN

## 2012-05-20 MED ORDER — PROMETHAZINE HCL 25 MG/ML IJ SOLN
25.0000 mg | Freq: Once | INTRAVENOUS | Status: AC
  Administered 2012-05-20: 25 mg via INTRAVENOUS
  Filled 2012-05-20: qty 1

## 2012-05-20 MED ORDER — LACTATED RINGERS IV SOLN
INTRAVENOUS | Status: DC
  Administered 2012-05-20: 23:00:00 via INTRAVENOUS

## 2012-05-20 NOTE — MAU Provider Note (Signed)
History     CSN: [id number]  Arrival date and time: 05/20/12 1941   First Provider Initiated Contact with Patient 05/20/12 2041      Chief Complaint  Patient presents with  . Contractions  . Emesis   HPI This is a 21 y.o. female at [redacted]w[redacted]d who presents with c/o 3days of nausea, vomiting and diarrhea.  Also c/o loss of mucous plug today. Denies contractions, leaking or bleeding. States has not been able to eat for 3 days.  RN Note: Patient states she thinks she lost her mucus plug today. Has been having some white discharge. Some irregular contractions since this morning. Scheduled for a c-section 11/26 Nausea and vomiting today. Unable to keep fluids down   OB History    Grav Para Term Preterm Abortions TAB SAB Ect Mult Living   2 1 1  0 0 0 0 0 0 1      Past Medical History  Diagnosis Date  . Anemia   . Depression   . Complication of anesthesia     ??, seizure with wisdom teeth  . Headache   . Urinary tract infection   . Psoriasis   . PID (acute pelvic inflammatory disease)   . Chlamydia   . Seizures     July 2013 - [facility]    Past Surgical History  Procedure Date  . Cesarean section   . Skin graft     off abd, onto arm  . Wisdom tooth extraction     Family History  Problem Relation Age of Onset  . Hypertension Mother   . Diabetes Mother   . Cancer Mother     BREAST  . Stroke Mother   . Seizures Mother   . Asthma Daughter   . Hypertension Maternal Grandmother   . Diabetes Maternal Grandmother   . Cancer Maternal Grandmother     BONE CANCER  . Other Neg Hx     History  Substance Use Topics  . Smoking status: Never Smoker   . Smokeless tobacco: Never Used  . Alcohol Use: No    Allergies: No Known Allergies  Prescriptions prior to admission  Medication Sig Dispense Refill  . Acetaminophen-Codeine (TYLENOL/CODEINE #3) 300-30 MG per tablet Take 1 tablet by mouth every 4 (four) hours as needed for pain.  15 tablet  0  . docusate sodium  (COLACE) 100 MG capsule Take 1 capsule (100 mg total) by mouth 2 (two) times daily as needed for constipation.  30 capsule  2  . ferrous sulfate (FERROUSUL) 325 (65 FE) MG tablet Take 1 tablet (325 mg total) by mouth 2 (two) times daily.  60 tablet  1  . sertraline (ZOLOFT) 50 MG tablet Take 100 mg by mouth daily.       . metroNIDAZOLE (FLAGYL) 500 MG tablet Take 1 tablet (500 mg total) by mouth 2 (two) times daily.  14 tablet  0    ROS See HPI  Physical Exam   Blood pressure 131/83, pulse 111, resp. rate 18, height 5\' 6"  (1.676 m), last menstrual period 09/07/2011, unknown if currently breastfeeding.  Physical Exam  Constitutional: She is oriented to person, place, and time. She appears well-developed. No distress.  Cardiovascular: Normal rate.   Respiratory: Effort normal.  GI: Soft. She exhibits no distension and no mass. There is no tenderness. There is no rebound and no guarding.  Genitourinary: Uterus normal. Vaginal discharge (mucous, no blood) found.       Dilation: Closed Effacement (%): Thick Station: -  3 Exam by:: Wynelle Bourgeois CNM   Musculoskeletal: Normal range of motion.  Neurological: She is alert and oriented to person, place, and time.  Skin: Skin is warm and dry.  Psychiatric: She has a normal mood and affect.    MAU Course  Procedures  MDM Will hydrate with one Phenergan bag and one LR bag then supportive care at home  Assessment and Plan  A:  SIUP at [redacted]w[redacted]d      Gastroenteritis  P:  IV hydration       Discharge home after hydration      Labor precautions  [facility] 05/20/2012, 8:57 PM

## 2012-05-20 NOTE — MAU Note (Signed)
Patient states she thinks she lost her mucus plug today. Has been having some white discharge. Some irregular contractions since this morning. Scheduled for a c-section 11/26

## 2012-05-20 NOTE — MAU Note (Signed)
Nausea and vomiting today. Unable to keep fluids down.

## 2012-05-22 ENCOUNTER — Encounter: Payer: Self-pay | Admitting: Physical Therapy

## 2012-05-22 ENCOUNTER — Encounter: Payer: Self-pay | Admitting: *Deleted

## 2012-05-22 NOTE — Telephone Encounter (Signed)
Discussed with provider and patient may do light cleaning and painting. Letter prepared and faxed.

## 2012-05-22 NOTE — Telephone Encounter (Signed)
Chloe Rodgers called again and left a message stating she is calling about getting a letter so she can do light cleaning.

## 2012-05-23 ENCOUNTER — Encounter: Payer: Medicaid Other | Admitting: Physical Therapy

## 2012-05-24 ENCOUNTER — Encounter: Payer: Medicaid Other | Admitting: Physical Therapy

## 2012-05-25 ENCOUNTER — Encounter: Payer: Self-pay | Admitting: *Deleted

## 2012-05-25 ENCOUNTER — Encounter: Payer: Self-pay | Admitting: Obstetrics and Gynecology

## 2012-05-25 ENCOUNTER — Other Ambulatory Visit: Payer: Self-pay | Admitting: Medical

## 2012-05-25 ENCOUNTER — Ambulatory Visit (INDEPENDENT_AMBULATORY_CARE_PROVIDER_SITE_OTHER): Payer: Self-pay | Admitting: Obstetrics and Gynecology

## 2012-05-25 VITALS — BP 129/75 | Temp 98.3°F | Wt 239.8 lb

## 2012-05-25 DIAGNOSIS — Z98891 History of uterine scar from previous surgery: Secondary | ICD-10-CM

## 2012-05-25 DIAGNOSIS — O99350 Diseases of the nervous system complicating pregnancy, unspecified trimester: Secondary | ICD-10-CM

## 2012-05-25 DIAGNOSIS — O99340 Other mental disorders complicating pregnancy, unspecified trimester: Secondary | ICD-10-CM

## 2012-05-25 DIAGNOSIS — B9689 Other specified bacterial agents as the cause of diseases classified elsewhere: Secondary | ICD-10-CM

## 2012-05-25 DIAGNOSIS — O34219 Maternal care for unspecified type scar from previous cesarean delivery: Secondary | ICD-10-CM

## 2012-05-25 DIAGNOSIS — N760 Acute vaginitis: Secondary | ICD-10-CM

## 2012-05-25 DIAGNOSIS — F329 Major depressive disorder, single episode, unspecified: Secondary | ICD-10-CM

## 2012-05-25 DIAGNOSIS — O0990 Supervision of high risk pregnancy, unspecified, unspecified trimester: Secondary | ICD-10-CM

## 2012-05-25 DIAGNOSIS — O09899 Supervision of other high risk pregnancies, unspecified trimester: Secondary | ICD-10-CM

## 2012-05-25 DIAGNOSIS — E669 Obesity, unspecified: Secondary | ICD-10-CM

## 2012-05-25 DIAGNOSIS — G40909 Epilepsy, unspecified, not intractable, without status epilepticus: Secondary | ICD-10-CM

## 2012-05-25 DIAGNOSIS — A499 Bacterial infection, unspecified: Secondary | ICD-10-CM

## 2012-05-25 LAB — POCT URINALYSIS DIP (DEVICE)
Glucose, UA: NEGATIVE mg/dL
Hgb urine dipstick: NEGATIVE
Ketones, ur: NEGATIVE mg/dL
Nitrite: NEGATIVE
Protein, ur: 100 mg/dL — AB
Specific Gravity, Urine: 1.025 (ref 1.005–1.030)
Urobilinogen, UA: 2 mg/dL — ABNORMAL HIGH (ref 0.0–1.0)
pH: 7 (ref 5.0–8.0)

## 2012-05-25 MED ORDER — METRONIDAZOLE 500 MG PO TABS: 500.0000 mg | ORAL_TABLET | Freq: Two times a day (BID) | ORAL | Status: DC

## 2012-05-25 NOTE — Patient Instructions (Signed)
Contraception Choices  Birth control (contraception) can stop pregnancy from happening. Different types of birth control work in different ways. Some can:  · Make the mucus in the cervix thick. This makes it hard for sperm to get into the uterus.  · Thin the lining of the uterus. This makes it hard for an egg to attach to the wall of the uterus.  · Stop the ovaries from releasing an egg.  · Block the sperm from reaching the egg.  Certain types of surgery can stop pregnancy from happening. For women, the sugery closes the fallopian tubes (tubal ligation). For men, the surgery stops sperm from releasing during sex (vasectomy).  HORMONAL BIRTH CONTROL  Hormonal birth control stops pregnancy by putting hormones into your body. Types of birth control include:  · A small tube put under the skin of the upper arm (implant). The tube can stay in place for 3 years.  · Shots given every 3 months.  · Pills taken every day or once after sex (intercourse).  · Patches that are changed once a week.  · A ring put into the vagina (vaginal ring). The ring is left in place for 3 weeks and removed for 1 week. Then, a new ring is put in the vagina.  BARRIER BIRTH CONTROL   Barrier birth control blocks sperm from reaching the egg. Types of birth control include:   · A thin covering worn on the penis (female condom) during sex.  · A soft, loose covering put into the vagina (female condom) before sex.  · A rubber bowl that sits over the cervix (diaphragm). The bowl must be made for you. The bowl is put into the vagina before sex. The bowl is left in place for 6 to 8 hours after sex.  · A small, soft cup that fits over the cervix (cervical cap). The cup must be made for you. The cup can be left in place for 48 hours after sex.  · A sponge that is put into the vagina before sex.  · A chemical that kills or blocks sperm from getting into the cervix and uterus (spermicide). The chemical may be a cream, jelly, foam, or pill.  INTRAUTERINE (IUD)  BIRTH CONTROL   IUD birth control is a small, T-shaped piece of plastic. The plastic is put inside the uterus. There are 2 types of IUD:  · Copper IUD. The IUD is covered in copper wire. The copper makes a fluid that kills sperm. It can stay in place for 10 years.  · Hormone IUD. The hormone stops pregnancy from happening. It can stay in place for 5 years.  NATURAL FAMILY PLANNING BIRTH CONTROL   Natural family planning means not having sex or using barrier birth control when the woman is fertile. A woman can:  · Use a calendar to keep track of when she is fertile.  · Use a thermometer to measure her body temperature.  Protect yourself against sexual diseases no matter what type of birth control you use. Talk to your doctor about which type of birth control is best for you.  Document Released: 04/25/2009 Document Revised: 09/20/2011 Document Reviewed: 11/04/2010  [employer]® Patient Information ©2013 [employer].

## 2012-05-25 NOTE — Progress Notes (Signed)
Pulse- 119  Edema-ankles, feet  Pain/pressure- lower abd Pt feels like mucous plug came out.  Went to MAU for contractions and discharge

## 2012-05-25 NOTE — Progress Notes (Signed)
Patient doing well without complaints. Unsure of birth control. Birth control options discussed. FM/labor precautions reviewed

## 2012-05-26 ENCOUNTER — Ambulatory Visit: Payer: Medicaid Other | Admitting: Physical Therapy

## 2012-05-29 ENCOUNTER — Encounter (HOSPITAL_COMMUNITY): Payer: Self-pay | Admitting: Pharmacist

## 2012-05-30 ENCOUNTER — Encounter (HOSPITAL_COMMUNITY)
Admission: RE | Admit: 2012-05-30 | Discharge: 2012-05-30 | Disposition: A | Discharge status: Home / Self Care | Payer: Medicaid Other | Source: Ambulatory Visit | Attending: Obstetrics & Gynecology | Admitting: Obstetrics & Gynecology

## 2012-05-30 ENCOUNTER — Encounter (HOSPITAL_COMMUNITY): Payer: Self-pay

## 2012-05-30 VITALS — BP 124/83 | HR 103 | Resp 18 | Ht 66.0 in | Wt 240.0 lb

## 2012-05-30 DIAGNOSIS — O34219 Maternal care for unspecified type scar from previous cesarean delivery: Secondary | ICD-10-CM

## 2012-05-30 HISTORY — DX: Other specified postprocedural states: R11.2

## 2012-05-30 HISTORY — DX: Anxiety disorder, unspecified: F41.9

## 2012-05-30 HISTORY — DX: Other specified postprocedural states: Z98.890

## 2012-05-30 LAB — CBC
HCT: 29.5 % — ABNORMAL LOW (ref 36.0–46.0)
Hemoglobin: 9.2 g/dL — ABNORMAL LOW (ref 12.0–15.0)
MCH: 24.1 pg — ABNORMAL LOW (ref 26.0–34.0)
MCHC: 31.2 g/dL (ref 30.0–36.0)
MCV: 77.4 fL — ABNORMAL LOW (ref 78.0–100.0)
Platelets: 168 10*3/uL (ref 150–400)
RBC: 3.81 MIL/uL — ABNORMAL LOW (ref 3.87–5.11)
RDW: 15.3 % (ref 11.5–15.5)
WBC: 5.9 10*3/uL (ref 4.0–10.5)

## 2012-05-30 LAB — ABO/RH: ABO/RH(D): A POS

## 2012-05-30 LAB — SURGICAL PCR SCREEN
MRSA, PCR: NEGATIVE
Staphylococcus aureus: POSITIVE — AB

## 2012-05-30 LAB — TYPE AND SCREEN
ABO/RH(D): A POS
Antibody Screen: NEGATIVE

## 2012-05-30 NOTE — Patient Instructions (Addendum)
   Your procedure is scheduled on:  Tuesday, Nov 26 at 245pm  Enter through the [employer] [facility] at: 115pm Pick up the phone at the desk and dial [phone] and inform us of your arrival.  Please call this number if you have any problems the morning of surgery: [phone]  Remember: Do not eat food after midnight: Monday Do not drink clear liquids after: 1030am Tuesday Take these medicines the morning of surgery with a SIP OF WATER: zoloft  Do not wear jewelry, make-up, or FINGER nail polish No metal in your hair or on your body. Do not wear lotions, powders, perfumes. You may wear deodorant.  Please use your CHG wash as directed prior to surgery.  Do not shave anywhere for at least 12 hours prior to first CHG shower.  Do not bring valuables to the hospital. Contacts, dentures or bridgework may not be worn into surgery.  Leave suitcase in the car. After Surgery it may be brought to your room. For patients being admitted to the hospital, checkout time is 11:00am the day of discharge.  Home with mother Chloe Rodgers - back to Room at the [facility] ph # [phone]

## 2012-05-31 ENCOUNTER — Telehealth: Payer: Self-pay | Admitting: *Deleted

## 2012-05-31 LAB — RPR: RPR Ser Ql: NONREACTIVE

## 2012-05-31 NOTE — Telephone Encounter (Signed)
Pt called and stated that she was told she has a staff infection and had to get medicine. She says she cannot afford this medicine. She wants to be admitted to receive the medicine. Diane called preop to find out what the medication was. It is a nasal ointment and if pt [facility] she can get a discounted price for the medicine. I informed patient that she doesn't need to be admitted for this medicine, but that she definitely should get the medicine and be treated. Pt states that she will find a way to get the meds.

## 2012-06-01 ENCOUNTER — Ambulatory Visit (INDEPENDENT_AMBULATORY_CARE_PROVIDER_SITE_OTHER): Payer: Self-pay | Admitting: Advanced Practice Midwife

## 2012-06-01 DIAGNOSIS — O368190 Decreased fetal movements, unspecified trimester, not applicable or unspecified: Secondary | ICD-10-CM

## 2012-06-01 DIAGNOSIS — L259 Unspecified contact dermatitis, unspecified cause: Secondary | ICD-10-CM

## 2012-06-01 DIAGNOSIS — B9689 Other specified bacterial agents as the cause of diseases classified elsewhere: Secondary | ICD-10-CM

## 2012-06-01 DIAGNOSIS — N760 Acute vaginitis: Secondary | ICD-10-CM

## 2012-06-01 DIAGNOSIS — A499 Bacterial infection, unspecified: Secondary | ICD-10-CM

## 2012-06-01 DIAGNOSIS — O09899 Supervision of other high risk pregnancies, unspecified trimester: Secondary | ICD-10-CM

## 2012-06-01 LAB — POCT URINALYSIS DIP (DEVICE)
Bilirubin Urine: NEGATIVE
Glucose, UA: NEGATIVE mg/dL
Hgb urine dipstick: NEGATIVE
Ketones, ur: NEGATIVE mg/dL
Nitrite: NEGATIVE
Protein, ur: 30 mg/dL — AB
Specific Gravity, Urine: 1.02 (ref 1.005–1.030)
Urobilinogen, UA: 1 mg/dL (ref 0.0–1.0)
pH: 7 (ref 5.0–8.0)

## 2012-06-01 MED ORDER — METRONIDAZOLE 0.75 % VA GEL: 1.0000 | Freq: Two times a day (BID) | VAGINAL | Status: DC

## 2012-06-01 MED ORDER — HYDROXYZINE PAMOATE 25 MG PO CAPS: 25.0000 mg | ORAL_CAPSULE | Freq: Four times a day (QID) | ORAL | Status: DC | PRN

## 2012-06-01 NOTE — Progress Notes (Signed)
No Fm x several days. Pos FHTs by doppler NST category I. RLTCS scheduled 06/05/12. C/O swollen pruritic bumps on feet. Very irritating. Asking for something to stop the itching. No swelling visible. ~5 excoriated areas on ankles and tops of feet. Do not appear to chigger bites, flea bites or poison ivy. Not infected. Dx contact dermatitis. Rx Vistaril or may use Benadryl. Can't keep down Flagyl for BV. Rx Metrogel. Informed that it may not be as effective in pregnancy.

## 2012-06-01 NOTE — Progress Notes (Signed)
Pulse-102  Continues to have lower abd pressure/pain Pt c/o having rash on feet that itch and swelling Pt can not keep down flagyl for BV needs another Rx.

## 2012-06-05 ENCOUNTER — Other Ambulatory Visit: Payer: Self-pay

## 2012-06-06 ENCOUNTER — Inpatient Hospital Stay (HOSPITAL_COMMUNITY)
Admission: AD | Admit: 2012-06-06 | Discharge: 2012-06-09 | DRG: 766 | Disposition: A | Discharge status: Home / Self Care | Payer: Medicaid Other | Source: Ambulatory Visit | Attending: Obstetrics & Gynecology | Admitting: Obstetrics & Gynecology

## 2012-06-06 ENCOUNTER — Encounter (HOSPITAL_COMMUNITY): Payer: Self-pay | Admitting: *Deleted

## 2012-06-06 ENCOUNTER — Encounter (HOSPITAL_COMMUNITY): Payer: Self-pay | Admitting: Anesthesiology

## 2012-06-06 ENCOUNTER — Encounter (HOSPITAL_COMMUNITY)
Admission: AD | Disposition: A | Discharge status: Home / Self Care | Payer: Self-pay | Source: Ambulatory Visit | Attending: Obstetrics & Gynecology

## 2012-06-06 ENCOUNTER — Inpatient Hospital Stay (HOSPITAL_COMMUNITY): Payer: Medicaid Other

## 2012-06-06 ENCOUNTER — Encounter (HOSPITAL_COMMUNITY): Payer: Self-pay

## 2012-06-06 DIAGNOSIS — O34219 Maternal care for unspecified type scar from previous cesarean delivery: Principal | ICD-10-CM | POA: Diagnosis present

## 2012-06-06 DIAGNOSIS — Z638 Other specified problems related to primary support group: Secondary | ICD-10-CM

## 2012-06-06 LAB — PREPARE RBC (CROSSMATCH)

## 2012-06-06 SURGERY — Surgical Case
Anesthesia: Spinal | Site: Abdomen | Wound class: Clean Contaminated

## 2012-06-06 MED ORDER — CEFAZOLIN SODIUM-DEXTROSE 2-3 GM-% IV SOLR
2.0000 g | INTRAVENOUS | Status: AC
  Administered 2012-06-06: 2 g via INTRAVENOUS

## 2012-06-06 MED ORDER — LACTATED RINGERS IV SOLN
INTRAVENOUS | Status: DC
  Administered 2012-06-07 (×3): via INTRAVENOUS

## 2012-06-06 MED ORDER — MORPHINE SULFATE 0.5 MG/ML IJ SOLN
INTRAMUSCULAR | Status: AC
  Filled 2012-06-06: qty 10

## 2012-06-06 MED ORDER — MENTHOL 3 MG MT LOZG: 1.0000 | LOZENGE | OROMUCOSAL | Status: DC | PRN

## 2012-06-06 MED ORDER — MORPHINE SULFATE (PF) 0.5 MG/ML IJ SOLN
INTRAMUSCULAR | Status: DC | PRN
  Administered 2012-06-06: .15 mg via INTRATHECAL

## 2012-06-06 MED ORDER — SODIUM CHLORIDE 0.9 % IJ SOLN: 3.0000 mL | INTRAMUSCULAR | Status: DC | PRN

## 2012-06-06 MED ORDER — NALOXONE HCL 0.4 MG/ML IJ SOLN: 0.4000 mg | INTRAMUSCULAR | Status: DC | PRN

## 2012-06-06 MED ORDER — NALBUPHINE HCL 10 MG/ML IJ SOLN
5.0000 mg | INTRAMUSCULAR | Status: DC | PRN
  Filled 2012-06-06: qty 1

## 2012-06-06 MED ORDER — METOCLOPRAMIDE HCL 5 MG/ML IJ SOLN: 10.0000 mg | Freq: Three times a day (TID) | INTRAMUSCULAR | Status: DC | PRN

## 2012-06-06 MED ORDER — SENNOSIDES-DOCUSATE SODIUM 8.6-50 MG PO TABS
2.0000 | ORAL_TABLET | Freq: Every day | ORAL | Status: DC
  Administered 2012-06-06 – 2012-06-08 (×3): 2 via ORAL

## 2012-06-06 MED ORDER — LANOLIN HYDROUS EX OINT: 1.0000 "application " | TOPICAL_OINTMENT | CUTANEOUS | Status: DC | PRN

## 2012-06-06 MED ORDER — ONDANSETRON HCL 4 MG/2ML IJ SOLN: 4.0000 mg | INTRAMUSCULAR | Status: DC | PRN

## 2012-06-06 MED ORDER — PNEUMOCOCCAL VAC POLYVALENT 25 MCG/0.5ML IJ INJ
0.5000 mL | INJECTION | INTRAMUSCULAR | Status: AC
  Filled 2012-06-06: qty 0.5

## 2012-06-06 MED ORDER — FENTANYL CITRATE 0.05 MG/ML IJ SOLN: 25.0000 ug | INTRAMUSCULAR | Status: DC | PRN

## 2012-06-06 MED ORDER — IBUPROFEN 600 MG PO TABS
600.0000 mg | ORAL_TABLET | Freq: Four times a day (QID) | ORAL | Status: DC | PRN
  Filled 2012-06-06 (×8): qty 1

## 2012-06-06 MED ORDER — OXYTOCIN 10 UNIT/ML IJ SOLN
INTRAMUSCULAR | Status: AC
  Filled 2012-06-06: qty 1

## 2012-06-06 MED ORDER — WITCH HAZEL-GLYCERIN EX PADS: 1.0000 "application " | MEDICATED_PAD | CUTANEOUS | Status: DC | PRN

## 2012-06-06 MED ORDER — KETOROLAC TROMETHAMINE 30 MG/ML IJ SOLN: 30.0000 mg | Freq: Four times a day (QID) | INTRAMUSCULAR | Status: AC | PRN

## 2012-06-06 MED ORDER — DIPHENHYDRAMINE HCL 25 MG PO CAPS: 25.0000 mg | ORAL_CAPSULE | Freq: Four times a day (QID) | ORAL | Status: DC | PRN

## 2012-06-06 MED ORDER — LACTATED RINGERS IV SOLN
INTRAVENOUS | Status: DC
  Administered 2012-06-06 (×4): via INTRAVENOUS

## 2012-06-06 MED ORDER — SCOPOLAMINE 1 MG/3DAYS TD PT72: 1.0000 | MEDICATED_PATCH | Freq: Once | TRANSDERMAL | Status: DC

## 2012-06-06 MED ORDER — ONDANSETRON HCL 4 MG PO TABS: 4.0000 mg | ORAL_TABLET | ORAL | Status: DC | PRN

## 2012-06-06 MED ORDER — DIPHENHYDRAMINE HCL 25 MG PO CAPS
25.0000 mg | ORAL_CAPSULE | ORAL | Status: DC | PRN
  Administered 2012-06-06: 25 mg via ORAL
  Filled 2012-06-06 (×2): qty 1

## 2012-06-06 MED ORDER — DIPHENHYDRAMINE HCL 50 MG/ML IJ SOLN: 25.0000 mg | INTRAMUSCULAR | Status: DC | PRN

## 2012-06-06 MED ORDER — ZOLPIDEM TARTRATE 5 MG PO TABS: 5.0000 mg | ORAL_TABLET | Freq: Every evening | ORAL | Status: DC | PRN

## 2012-06-06 MED ORDER — OXYCODONE-ACETAMINOPHEN 5-325 MG PO TABS
1.0000 | ORAL_TABLET | ORAL | Status: DC | PRN
  Administered 2012-06-07: 1 via ORAL
  Administered 2012-06-07: 2 via ORAL
  Administered 2012-06-07: 1 via ORAL
  Administered 2012-06-08: 2 via ORAL
  Administered 2012-06-08: 1 via ORAL
  Administered 2012-06-08 (×2): 2 via ORAL
  Administered 2012-06-08: 1 via ORAL
  Administered 2012-06-09: 2 via ORAL
  Administered 2012-06-09 (×2): 1 via ORAL
  Filled 2012-06-06 (×2): qty 1
  Filled 2012-06-06: qty 2
  Filled 2012-06-06: qty 1
  Filled 2012-06-06 (×2): qty 2
  Filled 2012-06-06: qty 1
  Filled 2012-06-06 (×3): qty 2

## 2012-06-06 MED ORDER — PHENYLEPHRINE 40 MCG/ML (10ML) SYRINGE FOR IV PUSH (FOR BLOOD PRESSURE SUPPORT)
PREFILLED_SYRINGE | INTRAVENOUS | Status: AC
  Filled 2012-06-06: qty 5

## 2012-06-06 MED ORDER — ONDANSETRON HCL 4 MG/2ML IJ SOLN: 4.0000 mg | Freq: Three times a day (TID) | INTRAMUSCULAR | Status: DC | PRN

## 2012-06-06 MED ORDER — OXYTOCIN 40 UNITS IN LACTATED RINGERS INFUSION - SIMPLE MED: 62.5000 mL/h | INTRAVENOUS | Status: AC

## 2012-06-06 MED ORDER — PRENATAL MULTIVITAMIN CH
1.0000 | ORAL_TABLET | Freq: Every day | ORAL | Status: DC
  Administered 2012-06-08 – 2012-06-09 (×2): 1 via ORAL
  Filled 2012-06-06 (×2): qty 1

## 2012-06-06 MED ORDER — SIMETHICONE 80 MG PO CHEW
80.0000 mg | CHEWABLE_TABLET | Freq: Three times a day (TID) | ORAL | Status: DC
  Administered 2012-06-06 – 2012-06-09 (×9): 80 mg via ORAL

## 2012-06-06 MED ORDER — TETANUS-DIPHTH-ACELL PERTUSSIS 5-2.5-18.5 LF-MCG/0.5 IM SUSP
0.5000 mL | Freq: Once | INTRAMUSCULAR | Status: AC
  Administered 2012-06-09: 0.5 mL via INTRAMUSCULAR
  Filled 2012-06-06 (×2): qty 0.5

## 2012-06-06 MED ORDER — BUPIVACAINE IN DEXTROSE 0.75-8.25 % IT SOLN
INTRATHECAL | Status: DC | PRN
  Administered 2012-06-06: 1.6 mL via INTRATHECAL

## 2012-06-06 MED ORDER — NALOXONE HCL 1 MG/ML IJ SOLN
1.0000 ug/kg/h | INTRAVENOUS | Status: DC | PRN
  Filled 2012-06-06: qty 2

## 2012-06-06 MED ORDER — OXYTOCIN 10 UNIT/ML IJ SOLN
40.0000 [IU] | INTRAVENOUS | Status: DC | PRN
  Administered 2012-06-06: 40 [IU] via INTRAVENOUS

## 2012-06-06 MED ORDER — FENTANYL CITRATE 0.05 MG/ML IJ SOLN
INTRAMUSCULAR | Status: AC
  Filled 2012-06-06: qty 2

## 2012-06-06 MED ORDER — DIPHENHYDRAMINE HCL 50 MG/ML IJ SOLN: 12.5000 mg | INTRAMUSCULAR | Status: DC | PRN

## 2012-06-06 MED ORDER — FENTANYL CITRATE 0.05 MG/ML IJ SOLN
INTRAMUSCULAR | Status: DC | PRN
  Administered 2012-06-06: 25 ug via INTRATHECAL

## 2012-06-06 MED ORDER — PHENYLEPHRINE HCL 10 MG/ML IJ SOLN
INTRAMUSCULAR | Status: DC | PRN
  Administered 2012-06-06: 80 ug via INTRAVENOUS
  Administered 2012-06-06: 40 ug via INTRAVENOUS
  Administered 2012-06-06: 80 ug via INTRAVENOUS

## 2012-06-06 MED ORDER — ONDANSETRON HCL 4 MG/2ML IJ SOLN
INTRAMUSCULAR | Status: DC | PRN
  Administered 2012-06-06: 4 mg via INTRAVENOUS

## 2012-06-06 MED ORDER — LACTATED RINGERS IV SOLN
INTRAVENOUS | Status: DC | PRN
  Administered 2012-06-06: 16:00:00 via INTRAVENOUS

## 2012-06-06 MED ORDER — LACTATED RINGERS IV SOLN: INTRAVENOUS | Status: DC

## 2012-06-06 MED ORDER — CEFAZOLIN SODIUM-DEXTROSE 2-3 GM-% IV SOLR
INTRAVENOUS | Status: AC
  Filled 2012-06-06: qty 50

## 2012-06-06 MED ORDER — SIMETHICONE 80 MG PO CHEW: 80.0000 mg | CHEWABLE_TABLET | ORAL | Status: DC | PRN

## 2012-06-06 MED ORDER — DIBUCAINE 1 % RE OINT: 1.0000 "application " | TOPICAL_OINTMENT | RECTAL | Status: DC | PRN

## 2012-06-06 MED ORDER — MEPERIDINE HCL 25 MG/ML IJ SOLN: 6.2500 mg | INTRAMUSCULAR | Status: DC | PRN

## 2012-06-06 MED ORDER — MUPIROCIN 2 % EX OINT
TOPICAL_OINTMENT | Freq: Two times a day (BID) | CUTANEOUS | Status: AC
  Administered 2012-06-06: 22:00:00 via NASAL
  Filled 2012-06-06: qty 22

## 2012-06-06 MED ORDER — IBUPROFEN 600 MG PO TABS
600.0000 mg | ORAL_TABLET | Freq: Four times a day (QID) | ORAL | Status: DC
  Administered 2012-06-06 – 2012-06-09 (×11): 600 mg via ORAL
  Filled 2012-06-06 (×3): qty 1

## 2012-06-06 MED ORDER — ONDANSETRON HCL 4 MG/2ML IJ SOLN
INTRAMUSCULAR | Status: AC
  Filled 2012-06-06: qty 2

## 2012-06-06 MED ORDER — SCOPOLAMINE 1 MG/3DAYS TD PT72
MEDICATED_PATCH | TRANSDERMAL | Status: AC
  Filled 2012-06-06: qty 1

## 2012-06-06 SURGICAL SUPPLY — 28 items
BARRIER ADHS 3X4 INTERCEED (GAUZE/BANDAGES/DRESSINGS) IMPLANT
CLOTH BEACON ORANGE TIMEOUT ST (SAFETY) ×2 IMPLANT
DRSG COVADERM 4X10 (GAUZE/BANDAGES/DRESSINGS) ×2 IMPLANT
DURAPREP 26ML APPLICATOR (WOUND CARE) ×2 IMPLANT
ELECT REM PT RETURN 9FT ADLT (ELECTROSURGICAL) ×2
ELECTRODE REM PT RTRN 9FT ADLT (ELECTROSURGICAL) ×1 IMPLANT
EXTRACTOR VACUUM KIWI (MISCELLANEOUS) IMPLANT
GLOVE BIO SURGEON STRL SZ 6.5 (GLOVE) ×2 IMPLANT
GLOVE BIOGEL PI IND STRL 7.0 (GLOVE) ×2 IMPLANT
GLOVE BIOGEL PI INDICATOR 7.0 (GLOVE) ×2
GOWN PREVENTION PLUS LG XLONG (DISPOSABLE) ×6 IMPLANT
KIT ABG SYR 3ML LUER SLIP (SYRINGE) IMPLANT
NEEDLE HYPO 25X5/8 SAFETYGLIDE (NEEDLE) IMPLANT
NS IRRIG 1000ML POUR BTL (IV SOLUTION) ×2 IMPLANT
PACK C SECTION WH (CUSTOM PROCEDURE TRAY) ×2 IMPLANT
PAD ABD 7.5X8 STRL (GAUZE/BANDAGES/DRESSINGS) ×2 IMPLANT
PAD OB MATERNITY 4.3X12.25 (PERSONAL CARE ITEMS) IMPLANT
SLEEVE SCD COMPRESS KNEE LRG (MISCELLANEOUS) IMPLANT
SLEEVE SCD COMPRESS KNEE MED (MISCELLANEOUS) IMPLANT
SUT PLAIN 2 0 XLH (SUTURE) ×2 IMPLANT
SUT VIC AB 0 CT1 36 (SUTURE) ×12 IMPLANT
SUT VIC AB 2-0 CT1 27 (SUTURE) ×1
SUT VIC AB 2-0 CT1 TAPERPNT 27 (SUTURE) ×1 IMPLANT
SUT VIC AB 4-0 PS2 27 (SUTURE) ×2 IMPLANT
TAPE PAPER 2X10 WHT MICROPORE (GAUZE/BANDAGES/DRESSINGS) ×2 IMPLANT
TOWEL OR 17X24 6PK STRL BLUE (TOWEL DISPOSABLE) ×4 IMPLANT
TRAY FOLEY CATH 14FR (SET/KITS/TRAYS/PACK) IMPLANT
WATER STERILE IRR 1000ML POUR (IV SOLUTION) IMPLANT

## 2012-06-06 NOTE — Anesthesia Procedure Notes (Signed)
Spinal  Patient location during procedure: OR Start time: 06/06/2012 3:10 PM Staffing Anesthesiologist: Illa Enlow A. Performed by: anesthesiologist  Preanesthetic Checklist Completed: patient identified, site marked, surgical consent, pre-op evaluation, timeout performed, IV checked, risks and benefits discussed and monitors and equipment checked Spinal Block Patient position: sitting Prep: site prepped and draped and DuraPrep Patient monitoring: heart rate, cardiac monitor, continuous pulse ox and blood pressure Approach: midline Location: L3-4 Injection technique: single-shot Needle Needle type: Sprotte  Needle gauge: 24 G Needle length: 9 cm Needle insertion depth: 6 cm Assessment Sensory level: T4 Additional Notes Patient tolerated procedure well. Adequate sensory level.

## 2012-06-06 NOTE — Op Note (Signed)
Procedure: Repeat low transverse cesarean section Preoperative diagnosis: Intrauterine pregnancy [redacted] weeks gestation with previous cesarean delivery. Postoperative diagnosis intrauterine pregnancy delivered Surgeon: Dr. Scheryl Darter Anesthesia: Spinal by Dr. Mal Amabile Findings: Liveborn female infant vigorous at birth Specimens: None Drains: Foley catheter Complications: None Counts: Correct Estimated blood loss: 600 mL   Patient gave written consent for repeat cesarean section at [redacted] weeks gestation. She declined trial of labor. Patient identification was confirmed and she was brought to the operating room and spinal anesthesia was induced. She was placed in dorsal supine position left lateral tilt. Foley catheter was placed and her abdomen was sterilely prepped and draped. 10 blade was used to make a Pfannenstiel incision at the site of her previous cesarean section scar. Incision was carried down to the fascia and the fascia was incised and incision was extended with curved Mayo scissors. Fascia was separated from underlying tissue attachments and scar with blunt and sharp dissection. The bellies of the rectus muscles were separated in the midline with scissors. The underlying peritoneum was entered and incision was extended. A Alexis retractor was placed. The lower uterine segment was identified and scored with #10 blade and the uterus was entered at the midline. Clear fluid was expressed and the incision was extended transversely. Fetal head was elevated and delivered atraumatically. Mouth and nose were cleared with bulb suction infant was delivered without difficulty. It was a female vigorous at birth. Cord was clamped and cut infant was handed to nursery personnel. The placenta was removed and uterus was explored. She received IV Pitocin. The incision was closed with a running locking suture with 0 Vicryl. An imbricating layer followed with 0 Vicryl and good hemostasis was seen. The adnexa  were normal. The peritoneum was closed with running suture with 2-0 Vicryl. Fascia was closed with a running suture with 0 Vicryl. Interrupted subcutaneous sutures with 20 plain gut placed in the subcutaneous layer. Good hemostasis was assured and the incision was irrigated. Skin was closed with a running subcuticular suture with 4-0 Vicryl. Sterile dressing was applied. Patient tolerated procedure well complications and she was brought in stable condition to PACU.  Dr. Scheryl Darter 06/06/2012 4:18 PM

## 2012-06-06 NOTE — Anesthesia Preprocedure Evaluation (Signed)
Anesthesia Evaluation  Patient identified by MRN, date of birth, ID band Patient awake    Reviewed: Allergy & Precautions, H&P , NPO status , Patient's Chart, lab work & pertinent test results  History of Anesthesia Complications (+) PONV  Airway Mallampati: III TM Distance: >3 FB Neck ROM: Full    Dental No notable dental hx. (+) Teeth Intact   Pulmonary neg pulmonary ROS,  breath sounds clear to auscultation  Pulmonary exam normal       Cardiovascular negative cardio ROS  Rhythm:Regular Rate:Normal     Neuro/Psych  Headaches, Seizures -, Well Controlled,  PSYCHIATRIC DISORDERS Anxiety Depression    GI/Hepatic negative GI ROS, Neg liver ROS, GERD-  Controlled,  Endo/Other  negative endocrine ROS  Renal/GU negative Renal ROS  negative genitourinary   Musculoskeletal negative musculoskeletal ROS (+)   Abdominal   Peds  Hematology  (+) Blood dyscrasia, anemia ,   Anesthesia Other Findings   Reproductive/Obstetrics (+) Pregnancy Previous C/Section                           Anesthesia Physical Anesthesia Plan  ASA: II  Anesthesia Plan: Spinal   Post-op Pain Management:    Induction:   Airway Management Planned: Natural Airway  Additional Equipment:   Intra-op Plan:   Post-operative Plan:   Informed Consent: I have reviewed the patients History and Physical, chart, labs and discussed the procedure including the risks, benefits and alternatives for the proposed anesthesia with the patient or authorized representative who has indicated his/her understanding and acceptance.   Dental advisory given  Plan Discussed with: Anesthesiologist, CRNA and Surgeon  Anesthesia Plan Comments:         Anesthesia Quick Evaluation

## 2012-06-06 NOTE — Transfer of Care (Signed)
Immediate Anesthesia Transfer of Care Note  Patient: Chloe Rodgers  Procedure(s) Performed: Procedure(s) (LRB) with comments: CESAREAN SECTION (N/A)  Patient Location: PACU  Anesthesia Type:Spinal  Level of Consciousness: awake, alert  and oriented  Airway & Oxygen Therapy: Patient Spontanous Breathing  Post-op Assessment: Report given to PACU RN  Post vital signs: Reviewed and stable  Complications: No apparent anesthesia complications

## 2012-06-06 NOTE — Anesthesia Postprocedure Evaluation (Signed)
  Anesthesia Post-op Note  Patient: Chloe Rodgers  Procedure(s) Performed: Procedure(s) (LRB) with comments: CESAREAN SECTION (N/A)  Patient Location: PACU  Anesthesia Type:Spinal  Level of Consciousness: awake, alert  and oriented  Airway and Oxygen Therapy: Patient Spontanous Breathing  Post-op Pain: none  Post-op Assessment: Post-op Vital signs reviewed, Patient's Cardiovascular Status Stable, Respiratory Function Stable, Patent Airway, No signs of Nausea or vomiting, Pain level controlled, No headache, No backache, No residual numbness and No residual motor weakness  Post-op Vital Signs: Reviewed and stable  Complications: No apparent anesthesia complications

## 2012-06-06 NOTE — Preoperative (Signed)
Beta Blockers   Reason not to administer Beta Blockers:Not Applicable 

## 2012-06-06 NOTE — H&P (Signed)
Chloe Rodgers is a 21 y.o. female presenting for repeat cesarean section.She declines TOLAC.G2P1001, Patient's last menstrual period was 09/07/2011.   Maternal Medical History:  Reason for admission: Repeat cesarean delivery  Fetal activity: Perceived fetal activity is normal.   Last perceived fetal movement was within the past hour.    Prenatal complications: No bleeding.   History of seizures    OB History    Grav Para Term Preterm Abortions TAB SAB Ect Mult Living   2 1 1  0 0 0 0 0 0 1     Past Medical History  Diagnosis Date  . Anemia   . Depression   . Complication of anesthesia     ??, seizure with wisdom teeth  . Urinary tract infection   . Psoriasis   . PID (acute pelvic inflammatory disease)   . Chlamydia   . PONV (postoperative nausea and vomiting)   . Seizures     July 2013 - [place]  . Headache     otc med prn  . Anxiety    Past Surgical History  Procedure Date  . Cesarean section   . Skin graft     off abd, onto arm  . Wisdom tooth extraction    Family History: family history includes Asthma in her daughter; Cancer in her maternal grandmother and mother; Diabetes in her maternal grandmother and mother; Hypertension in her maternal grandmother and mother; Seizures in her mother; and Stroke in her mother.  There is no history of Other. Social History:  reports that she has never smoked. She has never used smokeless tobacco. She reports that she drinks alcohol. She reports that she uses illicit drugs (Marijuana).   Prenatal Transfer Tool  Maternal Diabetes: No Genetic Screening: Normal Maternal Ultrasounds/Referrals: Normal Fetal Ultrasounds or other Referrals:  None Maternal Substance Abuse:  No Significant Maternal Medications:  Meds include: Zoloft Significant Maternal Lab Results:  None Other Comments:  None  Review of Systems  Constitutional: Negative for fever and chills.  Respiratory: Negative for cough.   Gastrointestinal: Negative  for abdominal pain.      Blood pressure 119/74, pulse 114, temperature 98.2 F (36.8 C), temperature source Oral, resp. rate 18, height 5\' 6"  (1.676 m), weight 233 lb (105.688 kg), last menstrual period 09/07/2011, SpO2 100.00%. Exam Physical Exam  Constitutional: She is oriented to person, place, and time. She appears well-developed and well-nourished. No distress.  Cardiovascular: Normal rate, regular rhythm and normal heart sounds.   Respiratory: Effort normal and breath sounds normal. No respiratory distress.  GI: Soft. There is no tenderness.       Gravid  Genitourinary:       deferred  Musculoskeletal: Normal range of motion.  Neurological: She is alert and oriented to person, place, and time.  Skin: Skin is warm and dry.  Psychiatric: She has a normal mood and affect. Her behavior is normal.    Prenatal labs: ABO, Rh: --/--/A POS, A POS (11/19 1230) Antibody: NEG (11/19 1230) Rubella:   RPR: NON REACTIVE (11/19 1230)  HBsAg:    HIV: NON REACTIVE (10/03 1146)  GBS:   negative  Assessment/Plan: Previous cesarean section, elects repeat at 39 weeks. Procedure and the risk of anesthesia, bleeding, transfusion, infection, visceral organ damage were discussed and her questions were answered.   ARNOLD,JAMES 06/06/2012, 2:16 PM

## 2012-06-07 LAB — CBC
HCT: 26.6 % — ABNORMAL LOW (ref 36.0–46.0)
Hemoglobin: 8.4 g/dL — ABNORMAL LOW (ref 12.0–15.0)
MCH: 24.1 pg — ABNORMAL LOW (ref 26.0–34.0)
MCHC: 31.6 g/dL (ref 30.0–36.0)
MCV: 76.2 fL — ABNORMAL LOW (ref 78.0–100.0)
Platelets: 121 10*3/uL — ABNORMAL LOW (ref 150–400)
RBC: 3.49 MIL/uL — ABNORMAL LOW (ref 3.87–5.11)
RDW: 15.4 % (ref 11.5–15.5)
WBC: 6.9 10*3/uL (ref 4.0–10.5)

## 2012-06-07 NOTE — Anesthesia Postprocedure Evaluation (Signed)
  Anesthesia Post-op Note  Patient: Chloe Rodgers  Procedure(s) Performed: Procedure(s) (LRB) with comments: CESAREAN SECTION (N/A)  Patient Location: PACU and Mother/Baby  Anesthesia Type:Spinal  Level of Consciousness: awake, alert  and oriented  Airway and Oxygen Therapy: Patient Spontanous Breathing  Post-op Pain: mild  Post-op Assessment: Patient's Cardiovascular Status Stable, Respiratory Function Stable, No signs of Nausea or vomiting, NAUSEA AND VOMITING PRESENT and Pain level controlled  Post-op Vital Signs: stable  Complications: No apparent anesthesia complications

## 2012-06-07 NOTE — Progress Notes (Signed)
UR chart review completed.  

## 2012-06-07 NOTE — Progress Notes (Signed)
Came up the hall to hear patient crying and a lot of yelling and cussing from room with patient and visitors that were there.  Patient didn't want her mother or visitors to take her 21 year old child with them.  They were threatening her with calling child protection services and she was yelling for them all to leave.  Security and RN's Deb Benefiel and Thomos Lemons were also helping in the room.  Patient requested baby be taken to the Nursery for a little while. Patient kept saying she keeps her 21 year old at [facility] with her and wants her here with her.   Security escorted all visitors to the waiting room and I went to the waiting room and brought the 21 year old to the room for the patient.  Patient getting calmer, explained to patient that she would be responsible for her 21 year old and asked if her doula could come and give her some support.  Patient stated she was going to call her doula. Spoke with Security and asked that he tell all visitors in the waiting room to leave for the night.  Called house coverage to let Okey Regal know what had happened.

## 2012-06-07 NOTE — Progress Notes (Signed)
Subjective: Postpartum Day 1: Cesarean Delivery Patient reports incisional pain.  Mild dizziness when standing up. Still has foley placed.  Objective: Vital signs in last 24 hours: Temp:  [97.6 F (36.4 C)-99.3 F (37.4 C)] 97.8 F (36.6 C) (11/27 0339) Pulse Rate:  [78-114] 88  (11/27 0339) Resp:  [17-21] 18  (11/27 0339) BP: (100-129)/(36-79) 103/60 mmHg (11/27 0339) SpO2:  [96 %-100 %] 96 % (11/27 0339)  Physical Exam:  General: alert, cooperative and no distress Lochia: appropriate Uterine Fundus: firm Incision: healing well DVT Evaluation: No evidence of DVT seen on physical exam.   Basename 06/07/12 0620  HGB 8.4*  HCT 26.6*    Assessment/Plan: Status post Cesarean section. Doing well postoperatively.  Mild dizziness yesterday. No signficant blood loss. Hb low but around baseline. No other symptoms, hemodynamically stable. Encourage to ambulate. D/C foley when pt is able to walk to the restroom.  Continue current care. Undecided about contraception method. Considering other option than Depo since she got pregnant with this method.  Breast feeding.  Plan on going home tomorrow.   Lillia Abed 06/07/2012, 7:53 AM   Agree with resident note, saw patient.  ARNOLD,JAMES

## 2012-06-07 NOTE — Clinical Social Work Maternal (Signed)
Clinical Social Work Department  PSYCHOSOCIAL ASSESSMENT - MATERNAL/CHILD  06/07/2012  Patient: Chloe Rodgers,Chloe Rodgers Account Number: [id number] Admit Date: 06/06/2012  Childs Name:  Chloe Rodgers   Clinical Social Worker: Carel Carrier, LCSWA Date/Time: 06/07/2012 03:31 PM  Date Referred: 06/07/2012  Referral source   CN    Referred reason   Behavioral Health Issues   Substance Abuse   Other referral source:  I: FAMILY / HOME ENVIRONMENT  Child's legal guardian: PARENT  Guardian - Name  Guardian - Age  Guardian - Address   Chloe Rodgers  21  [address] [place], [place]   Chloe Rodgers  22  [place]   Other household support members/support persons  Name  Relationship  DOB   Chloe Rodgers  DAUGHTER  02/25/11   Other support:  II PSYCHOSOCIAL DATA  Information Source: Patient Interview  Financial and Community Resources  Employment:  Financial resources: Medicaid  If Medicaid - County: [place]  Other   WIC   School / Grade:  Maternity Care Coordinator / Child Services Coordination / Early Interventions: Cultural issues impacting care:  III STRENGTHS  Strengths   Adequate Resources   Home prepared for Child (including basic supplies)   Supportive family/friends   Strength comment:  IV RISK FACTORS AND CURRENT PROBLEMS  Current Problem: YES  Risk Factor & Current Problem  Patient Issue  Family Issue  Risk Factor / Current Problem Comment   Mental Illness  Y  N  Hx of depression/anxiety   Substance Abuse  Y  N  Hx of MJ use   V SOCIAL WORK ASSESSMENT  CSW met with pt to assess history of depression/anxiety & MJ use. Pt admits to feeling depressed during the pregnancy, as she talked about having a strained relationship with her mother & limited support system. As a result, pt moved out of her mothers home into [facility] a [facility] shelter for pregnant women and children. Pt told CSW that she has lived at [facility] for a couple of months and plans to  return until permanent housing is secured. Pt has applied for public housing, pathways and section 8. This CSW met with pt in August and referred to [facility], where pt has participated in weekly sessions to date. Additionally, pt was taking Zoloft during pregnancy but stopped because it made her feel more depressed. She plans to continue counseling sessions and follow up with doctor to request medication evaluation. She denies any history of SI but expresses some feelings of depression now, as she talked about feeling overwhelmed with 2 children. Pt's support system is limited but she reports that she can call her therapist anytime. Pt's mom is visiting with the pt and their relationship is "better" now, as per pt. Pt admits to smoking MJ in the beginning of pregnancy and states she hasn't used since February. CSW explained hospital drug testing policy and pt verbalized understanding. UDS is negative, meconium results are pending. She reports having all the necessary supplies for the infant but could use more clothing. CSW provided pt with a bundle pack of clothing. Pt appears to be bonding appropriately and agrees to continue mental health follow up upon discharge.   VI SOCIAL WORK PLAN  Social Work Plan   No Further Intervention Required / No Barriers to Discharge   Type of pt/family education:  If child protective services report - county:  If child protective services report - date:  Information/referral to community resources comment:  Other social work plan:       services report - date:   Information/referral to community resources comment:   Other social work plan:

## 2012-06-07 NOTE — Addendum Note (Signed)
Addendum  created 06/07/12 0935 by Elbert Ewings, CRNA   Modules edited:Notes Section

## 2012-06-08 ENCOUNTER — Encounter (HOSPITAL_COMMUNITY): Payer: Self-pay | Admitting: Obstetrics & Gynecology

## 2012-06-08 DIAGNOSIS — Z638 Other specified problems related to primary support group: Secondary | ICD-10-CM

## 2012-06-08 MED ORDER — BISACODYL 10 MG RE SUPP: 10.0000 mg | Freq: Every day | RECTAL | Status: DC | PRN
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
## 2012-06-08 NOTE — Progress Notes (Addendum)
Lactation Consultation Note  Patient Name: Chloe Rodgers [id number] Date: 06/07/2012   Today's Date: 06/07/2012  Reason for consult: Follow-up assessment. LC spoke with RN, Sheralyn Boatman concerning some family issues that occurred tonight prior to visiting this second-time mother and her 21 yo daughter, as well as her newborn. Her Doula is present during visit and states she will assist her with breastfeeding, as she is planning to stay tonight to assist with care of mother, older sibling (1 yo) and newborn. Baby is nursing every 1-3 hours for about 10-20 minutes with output wnl. Mom states it is "starting to hurt when I nurse" so both LC and Doula encouraged her to get help to observe latch and determine if baby is latching correctly. LC encouraged mom to express colostrum onto her nipples after feedings for comfort and protection/healing.       Maternal Data    Feeding    LATCH Score/Interventions           Doula to observe or Mom to call for [facility]           Lactation Tools Discussed/Used   Nipple care with proper latch, expressed milk onto nipple after feedings  Consult Status    LC follow-up tomorrow  Lynda Rainwater 06/08/2012, 5:21 PM

## 2012-06-09 MED ORDER — OXYCODONE-ACETAMINOPHEN 5-325 MG PO TABS: 1.0000 | ORAL_TABLET | ORAL | Status: DC | PRN

## 2012-06-09 MED ORDER — MEDROXYPROGESTERONE ACETATE 150 MG/ML IM SUSP
150.0000 mg | Freq: Once | INTRAMUSCULAR | Status: DC
  Filled 2012-06-09: qty 1

## 2012-06-09 MED ORDER — IBUPROFEN 600 MG PO TABS: 600.0000 mg | ORAL_TABLET | Freq: Four times a day (QID) | ORAL | Status: DC | PRN

## 2012-06-09 NOTE — Discharge Summary (Addendum)
Obstetric Discharge Summary Reason for Admission: cesarean section Prenatal Procedures: ultrasound Intrapartum Procedures: cesarean: low cervical, transverse Postpartum Procedures: none Complications-Operative and Postpartum: none Hemoglobin  Date Value Range Status  06/07/2012 8.4* 12.0 - 15.0 g/dL Final  4/54/0981 19.1   Final     HCT  Date Value Range Status  06/07/2012 26.6* 36.0 - 46.0 % Final  12/22/2011 34   Final    Physical Exam:  General: alert, cooperative and no distress Lochia: appropriate Uterine Fundus: firm Incision: healing well DVT Evaluation: No evidence of DVT seen on physical exam.  Discharge Diagnoses: Term Pregnancy-delivered  Discharge Information: Date: 06/09/2012 Activity: unrestricted Diet: routine Medications: Ibuprofen, Colace, Iron and Percocet Condition: stable Instructions: refer to practice specific booklet Discharge to: home Follow-up Information    Follow up with [facility]. In 4 weeks. (make a follow up appointment. )    Contact information:   [address] [place] [address] [phone]         Newborn Data: Live born female  Birth Weight: 7 lb 15.9 oz (3625 g) APGAR: 8, 9  Home with mother.  PILOTO, DAYARMYS 06/09/2012, 8:57 AM  I examined pt and agree with documentation above and resident plan of care. [facility]

## 2012-06-09 NOTE — Discharge Summary (Addendum)
I examined pt and agree with documentation above and resident plan of care. MUHAMMAD,WALIDAH  

## 2012-06-10 LAB — TYPE AND SCREEN
ABO/RH(D): A POS
Antibody Screen: NEGATIVE
Unit division: 0
Unit division: 0

## 2012-06-17 ENCOUNTER — Encounter (HOSPITAL_COMMUNITY): Payer: Self-pay | Admitting: *Deleted

## 2012-06-17 ENCOUNTER — Emergency Department (HOSPITAL_COMMUNITY): Payer: Medicaid Other

## 2012-06-17 ENCOUNTER — Emergency Department (HOSPITAL_COMMUNITY)
Admission: EM | Admit: 2012-06-17 | Discharge: 2012-06-18 | Disposition: A | Discharge status: Home / Self Care | Payer: Medicaid Other | Attending: Emergency Medicine | Admitting: Emergency Medicine

## 2012-06-17 DIAGNOSIS — Z87898 Personal history of other specified conditions: Secondary | ICD-10-CM | POA: Insufficient documentation

## 2012-06-17 DIAGNOSIS — Z8669 Personal history of other diseases of the nervous system and sense organs: Secondary | ICD-10-CM | POA: Insufficient documentation

## 2012-06-17 DIAGNOSIS — Z862 Personal history of diseases of the blood and blood-forming organs and certain disorders involving the immune mechanism: Secondary | ICD-10-CM | POA: Insufficient documentation

## 2012-06-17 DIAGNOSIS — Z79899 Other long term (current) drug therapy: Secondary | ICD-10-CM | POA: Insufficient documentation

## 2012-06-17 DIAGNOSIS — Z8679 Personal history of other diseases of the circulatory system: Secondary | ICD-10-CM | POA: Insufficient documentation

## 2012-06-17 DIAGNOSIS — N6459 Other signs and symptoms in breast: Secondary | ICD-10-CM | POA: Insufficient documentation

## 2012-06-17 DIAGNOSIS — R109 Unspecified abdominal pain: Secondary | ICD-10-CM

## 2012-06-17 DIAGNOSIS — Z87440 Personal history of urinary (tract) infections: Secondary | ICD-10-CM | POA: Insufficient documentation

## 2012-06-17 DIAGNOSIS — Z872 Personal history of diseases of the skin and subcutaneous tissue: Secondary | ICD-10-CM | POA: Insufficient documentation

## 2012-06-17 DIAGNOSIS — Z8659 Personal history of other mental and behavioral disorders: Secondary | ICD-10-CM | POA: Insufficient documentation

## 2012-06-17 DIAGNOSIS — R1031 Right lower quadrant pain: Secondary | ICD-10-CM | POA: Insufficient documentation

## 2012-06-17 LAB — CBC WITH DIFFERENTIAL/PLATELET
Basophils Absolute: 0 10*3/uL (ref 0.0–0.1)
Basophils Relative: 0 % (ref 0–1)
Eosinophils Absolute: 0.1 10*3/uL (ref 0.0–0.7)
Eosinophils Relative: 2 % (ref 0–5)
HCT: 36.2 % (ref 36.0–46.0)
Hemoglobin: 11.4 g/dL — ABNORMAL LOW (ref 12.0–15.0)
Lymphocytes Relative: 19 % (ref 12–46)
Lymphs Abs: 1.6 10*3/uL (ref 0.7–4.0)
MCH: 24.1 pg — ABNORMAL LOW (ref 26.0–34.0)
MCHC: 31.5 g/dL (ref 30.0–36.0)
MCV: 76.4 fL — ABNORMAL LOW (ref 78.0–100.0)
Monocytes Absolute: 0.7 10*3/uL (ref 0.1–1.0)
Monocytes Relative: 8 % (ref 3–12)
Neutro Abs: 6 10*3/uL (ref 1.7–7.7)
Neutrophils Relative %: 72 % (ref 43–77)
Platelets: 203 10*3/uL (ref 150–400)
RBC: 4.74 MIL/uL (ref 3.87–5.11)
RDW: 15.6 % — ABNORMAL HIGH (ref 11.5–15.5)
WBC: 8.3 10*3/uL (ref 4.0–10.5)

## 2012-06-17 LAB — COMPREHENSIVE METABOLIC PANEL
ALT: 13 U/L (ref 0–35)
AST: 13 U/L (ref 0–37)
Albumin: 3.7 g/dL (ref 3.5–5.2)
Alkaline Phosphatase: 129 U/L — ABNORMAL HIGH (ref 39–117)
BUN: 16 mg/dL (ref 6–23)
CO2: 23 mEq/L (ref 19–32)
Calcium: 9.8 mg/dL (ref 8.4–10.5)
Chloride: 105 mEq/L (ref 96–112)
Creatinine, Ser: 0.83 mg/dL (ref 0.50–1.10)
GFR calc Af Amer: >90 mL/min (ref >90)
GFR calc non Af Amer: >90 mL/min (ref >90)
Glucose, Bld: 81 mg/dL (ref 70–99)
Potassium: 3.9 mEq/L (ref 3.5–5.1)
Sodium: 141 mEq/L (ref 135–145)
Total Bilirubin: 0.3 mg/dL (ref 0.3–1.2)
Total Protein: 8 g/dL (ref 6.0–8.3)

## 2012-06-17 LAB — URINALYSIS, ROUTINE W REFLEX MICROSCOPIC
Bilirubin Urine: NEGATIVE
Glucose, UA: NEGATIVE mg/dL
Ketones, ur: NEGATIVE mg/dL
Leukocytes, UA: NEGATIVE
Nitrite: NEGATIVE
Protein, ur: NEGATIVE mg/dL
Specific Gravity, Urine: 1.03 (ref 1.005–1.030)
Urobilinogen, UA: 1 mg/dL (ref 0.0–1.0)
pH: 6 (ref 5.0–8.0)

## 2012-06-17 LAB — URINE MICROSCOPIC-ADD ON

## 2012-06-17 MED ORDER — ONDANSETRON HCL 4 MG/2ML IJ SOLN
4.0000 mg | Freq: Once | INTRAMUSCULAR | Status: AC
  Administered 2012-06-17: 4 mg via INTRAVENOUS
  Filled 2012-06-17: qty 2

## 2012-06-17 MED ORDER — IOHEXOL 300 MG/ML  SOLN
20.0000 mL | INTRAMUSCULAR | Status: AC
  Administered 2012-06-17: 20 mL via ORAL

## 2012-06-17 MED ORDER — IBUPROFEN 600 MG PO TABS: 600.0000 mg | ORAL_TABLET | Freq: Four times a day (QID) | ORAL | Status: DC | PRN

## 2012-06-17 MED ORDER — OXYCODONE-ACETAMINOPHEN 5-325 MG PO TABS: 1.0000 | ORAL_TABLET | ORAL | Status: DC | PRN

## 2012-06-17 MED ORDER — IOHEXOL 300 MG/ML  SOLN
100.0000 mL | Freq: Once | INTRAMUSCULAR | Status: AC | PRN
  Administered 2012-06-17: 100 mL via INTRAVENOUS

## 2012-06-17 MED ORDER — MORPHINE SULFATE 4 MG/ML IJ SOLN
4.0000 mg | Freq: Once | INTRAMUSCULAR | Status: AC
  Administered 2012-06-17: 4 mg via INTRAVENOUS
  Filled 2012-06-17: qty 1

## 2012-06-17 NOTE — ED Notes (Signed)
Patient transported to CT 

## 2012-06-17 NOTE — ED Notes (Signed)
Reports having pain to right breast, has been breast feeding and baby has thrush. Reports pain when baby nurses from right side. Had c section on 11/26 and having right side lower abd pain and thinks she busted a few sutures. No acute distress noted at this time.

## 2012-06-17 NOTE — ED Notes (Addendum)
Patient's friend at the bedside.

## 2012-06-17 NOTE — ED Notes (Signed)
Pt asked for pain medication. Notified PA

## 2012-06-17 NOTE — ED Provider Notes (Signed)
History     CSN: [id number]  Arrival date & time 06/17/12  1400   First MD Initiated Contact with Patient 06/17/12 1708      Chief Complaint  Patient presents with  . Abdominal Pain  . Breast Problem    (Consider location/radiation/quality/duration/timing/severity/associated sxs/prior treatment) HPI Chloe Rodgers is a 21 y.o. female who presents to ed complaining of right lower abdominal pain and pain in bilateral breasts. Pt is 2 wks post partum, after a c section. No complications after the procedure, states was discharged home. States she has been very active since. Reports onset of right lower quadraint pain about 3 days ago, states it is worsening. States pain worsening with movement and palpation. Reports vaginal bleeding, since delivery. Pain with urination.  States also bilateral breast pain. States stopped breast feeding while still int he hospital due to "painful feeds." states since then, breast are swollen, tender all over. Pain with feeds. Unable to pump of feed due to pain. Pt states breasts are leaking milk. Reports she did have a fever at home up to 103 2 days ago, none today.    Past Medical History  Diagnosis Date  . Anemia   . Depression   . Complication of anesthesia     ??, seizure with wisdom teeth  . Urinary tract infection   . Psoriasis   . PID (acute pelvic inflammatory disease)   . Chlamydia   . PONV (postoperative nausea and vomiting)   . Seizures     July 2013 - [place]  . Headache     otc med prn  . Anxiety     Past Surgical History  Procedure Date  . Cesarean section   . Skin graft     off abd, onto arm  . Wisdom tooth extraction   . Cesarean section 06/06/2012    Procedure: CESAREAN SECTION;  Surgeon: Adam Phenix, MD;  Location: [facility];  Service: Obstetrics;  Laterality: N/A;    Family History  Problem Relation Age of Onset  . Hypertension Mother   . Diabetes Mother   . Cancer Mother     BREAST  . Stroke Mother   . Seizures  Mother   . Asthma Daughter   . Hypertension Maternal Grandmother   . Diabetes Maternal Grandmother   . Cancer Maternal Grandmother     BONE CANCER  . Other Neg Hx     History  Substance Use Topics  . Smoking status: Never Smoker   . Smokeless tobacco: Never Used  . Alcohol Use: Yes     Comment: socially but none with pregnancy    OB History    Grav Para Term Preterm Abortions TAB SAB Ect Mult Living   2 2 2  0 0 0 0 0 0 2      Review of Systems  Allergies  Review of patient's allergies indicates no known allergies.  Home Medications   Current Outpatient Rx  Name  Route  Sig  Dispense  Refill  . DOCUSATE SODIUM 100 MG PO CAPS   Oral   Take 1 capsule (100 mg total) by mouth 2 (two) times daily as needed for constipation.   30 capsule   2   . FERROUS SULFATE 325 (65 FE) MG PO TABS   Oral   Take 1 tablet (325 mg total) by mouth 2 (two) times daily.   60 tablet   1   . SERTRALINE HCL 50 MG PO TABS   Oral   Take  100 mg by mouth daily.            BP 119/73  Pulse 85  Temp 98.4 F (36.9 C) (Oral)  SpO2 97%  LMP 09/07/2011  Physical Exam  Constitutional: She appears well-developed and well-nourished. No distress.  HENT:  Head: Normocephalic.  Eyes: Conjunctivae normal are normal.  Cardiovascular: Normal rate, regular rhythm and normal heart sounds.   Pulmonary/Chest: Effort normal and breath sounds normal. No respiratory distress. She has no wheezes. She has no rales.       Breast are uniformly engorged, tender diffusely, no erythema over the skin. Leaking mild bilaterally  Abdominal: Soft. Bowel sounds are normal.       C-section incision intact, normal, no signs of infection. RLQ tenderness, guarding. No CVA tenderness  Neurological: She is alert.  Skin: Skin is warm and dry.    ED Course  Procedures (including critical care time) Pt with bilateral breast engorgement, no signs of mastitis or infection. Advised trying to massage breasts, pump, warm  compresses.  She is having significant tenderness to the right lower abdomen. Will get labs, CT abd/pelvis to r/o post op infection vs appendicitis.   Results for orders placed during the hospital encounter of 06/17/12  CBC WITH DIFFERENTIAL      Component Value Range   WBC 8.3  4.0 - 10.5 K/uL   RBC 4.74  3.87 - 5.11 MIL/uL   Hemoglobin 11.4 (*) 12.0 - 15.0 g/dL   HCT 04.5  40.9 - 81.1 %   MCV 76.4 (*) 78.0 - 100.0 fL   MCH 24.1 (*) 26.0 - 34.0 pg   MCHC 31.5  30.0 - 36.0 g/dL   RDW 91.4 (*) 78.2 - 95.6 %   Platelets 203  150 - 400 K/uL   Neutrophils Relative 72  43 - 77 %   Neutro Abs 6.0  1.7 - 7.7 K/uL   Lymphocytes Relative 19  12 - 46 %   Lymphs Abs 1.6  0.7 - 4.0 K/uL   Monocytes Relative 8  3 - 12 %   Monocytes Absolute 0.7  0.1 - 1.0 K/uL   Eosinophils Relative 2  0 - 5 %   Eosinophils Absolute 0.1  0.0 - 0.7 K/uL   Basophils Relative 0  0 - 1 %   Basophils Absolute 0.0  0.0 - 0.1 K/uL  COMPREHENSIVE METABOLIC PANEL      Component Value Range   Sodium 141  135 - 145 mEq/L   Potassium 3.9  3.5 - 5.1 mEq/L   Chloride 105  96 - 112 mEq/L   CO2 23  19 - 32 mEq/L   Glucose, Bld 81  70 - 99 mg/dL   BUN 16  6 - 23 mg/dL   Creatinine, Ser 2.13  0.50 - 1.10 mg/dL   Calcium 9.8  8.4 - 08.6 mg/dL   Total Protein 8.0  6.0 - 8.3 g/dL   Albumin 3.7  3.5 - 5.2 g/dL   AST 13  0 - 37 U/L   ALT 13  0 - 35 U/L   Alkaline Phosphatase 129 (*) 39 - 117 U/L   Total Bilirubin 0.3  0.3 - 1.2 mg/dL   GFR calc non Af Amer >90  >90 mL/min   GFR calc Af Amer >90  >90 mL/min  URINALYSIS, ROUTINE W REFLEX MICROSCOPIC      Component Value Range   Color, Urine YELLOW  YELLOW   APPearance CLEAR  CLEAR   Specific Gravity,  Urine 1.030  1.005 - 1.030   pH 6.0  5.0 - 8.0   Glucose, UA NEGATIVE  NEGATIVE mg/dL   Hgb urine dipstick SMALL (*) NEGATIVE   Bilirubin Urine NEGATIVE  NEGATIVE   Ketones, ur NEGATIVE  NEGATIVE mg/dL   Protein, ur NEGATIVE  NEGATIVE mg/dL   Urobilinogen, UA 1.0  0.0 -  1.0 mg/dL   Nitrite NEGATIVE  NEGATIVE   Leukocytes, UA NEGATIVE  NEGATIVE  URINE MICROSCOPIC-ADD ON      Component Value Range   WBC, UA 0-2  <3 WBC/hpf    Pt placed in CDU awaiting results of CT, pt to be monitored there by PA Pickering, and disposition based on results.       No diagnosis found.    MDM          Lottie Mussel, PA 06/18/12 [phone]

## 2012-06-20 ENCOUNTER — Encounter: Payer: Self-pay | Admitting: Physical Therapy

## 2012-06-22 NOTE — ED Provider Notes (Signed)
Medical screening examination/treatment/procedure(s) were performed by non-physician practitioner and as supervising physician I was immediately available for consultation/collaboration.  Santoria Chason, MD 06/22/12 0134 

## 2012-06-23 ENCOUNTER — Encounter: Payer: Self-pay | Admitting: Physical Therapy

## 2012-07-10 ENCOUNTER — Ambulatory Visit: Payer: Self-pay | Admitting: Obstetrics & Gynecology

## 2012-07-12 DIAGNOSIS — N730 Acute parametritis and pelvic cellulitis: Secondary | ICD-10-CM

## 2012-07-12 HISTORY — DX: Acute parametritis and pelvic cellulitis: N73.0

## 2012-07-24 ENCOUNTER — Encounter: Payer: Self-pay | Admitting: Advanced Practice Midwife

## 2012-07-24 ENCOUNTER — Other Ambulatory Visit (HOSPITAL_COMMUNITY)
Admission: RE | Admit: 2012-07-24 | Discharge: 2012-07-24 | Disposition: A | Discharge status: Home / Self Care | Payer: Medicaid Other | Source: Ambulatory Visit | Attending: Obstetrics and Gynecology | Admitting: Obstetrics and Gynecology

## 2012-07-24 ENCOUNTER — Ambulatory Visit (INDEPENDENT_AMBULATORY_CARE_PROVIDER_SITE_OTHER): Payer: Medicaid Other | Admitting: Advanced Practice Midwife

## 2012-07-24 VITALS — BP 135/81 | HR 75 | Temp 97.1°F | Ht 66.0 in | Wt 216.1 lb

## 2012-07-24 DIAGNOSIS — Z113 Encounter for screening for infections with a predominantly sexual mode of transmission: Secondary | ICD-10-CM | POA: Insufficient documentation

## 2012-07-24 DIAGNOSIS — N760 Acute vaginitis: Secondary | ICD-10-CM | POA: Insufficient documentation

## 2012-07-24 DIAGNOSIS — Z9889 Other specified postprocedural states: Secondary | ICD-10-CM

## 2012-07-24 DIAGNOSIS — G43909 Migraine, unspecified, not intractable, without status migrainosus: Secondary | ICD-10-CM

## 2012-07-24 MED ORDER — SUMATRIPTAN SUCCINATE 100 MG PO TABS: 100.0000 mg | ORAL_TABLET | Freq: Once | ORAL | Status: DC | PRN

## 2012-07-24 MED ORDER — FLUOXETINE HCL 20 MG PO TABS: 20.0000 mg | ORAL_TABLET | Freq: Every day | ORAL | Status: DC

## 2012-07-24 MED ORDER — LIDOCAINE HCL 2 % EX GEL: CUTANEOUS | Status: DC | PRN

## 2012-07-24 MED ORDER — NYSTATIN-TRIAMCINOLONE 100000-0.1 UNIT/GM-% EX OINT: TOPICAL_OINTMENT | Freq: Two times a day (BID) | CUTANEOUS | Status: DC

## 2012-07-24 MED ORDER — FLUCONAZOLE 150 MG PO TABS: 150.0000 mg | ORAL_TABLET | Freq: Once | ORAL | Status: DC

## 2012-07-24 NOTE — Progress Notes (Signed)
Patient ID: Chloe Rodgers, female   DOB: 1990/07/22, 22 y.o.   MRN: [id number] Subjective:     Chloe Rodgers is a 22 y.o. female who presents for a postpartum visit. She is 7 weeks postpartum following a low cervical transverse Cesarean section. I have fully reviewed the prenatal and intrapartum course. The delivery was at 39 gestational weeks. Outcome: repeat cesarean section, low transverse incision. Anesthesia: spinal. Postpartum course has been normal except for ongoing vaginal pain x 2 weeks. Baby's course has been normal. Baby is feeding by bottle. Bleeding staining only. Bowel function is normal. Bladder function is abnormal: burning with urination. Patient is sexually active. Contraception method is none. Postpartum depression screening: positive. Pt was taking Zoloft, stopped because she states it was not working at all. Denies SI/HI.   Pt desires OCPs for contraception, considering long term options including Mirena and Nexplanon.   Pt states she had pap in [facility] with MD at beginning of pregnancy which was normal.   Has h/o migraines - stopped imitrex in pregnancy, still having migraines, would like new rx for imitrex until she can f/u with her migraine dr  The following portions of the patient's history were reviewed and updated as appropriate: allergies, current medications, past family history, past medical history, past social history, past surgical history and problem list.  Review of Systems Pertinent items are noted in HPI.   Objective:    BP 135/81  Pulse 75  Temp 97.1 F (36.2 C) (Oral)  Ht 5\' 6"  (1.676 m)  Wt 216 lb 1.6 oz (98.022 kg)  BMI 34.88 kg/m2  Breastfeeding? No  Physical Exam  Nursing note and vitals reviewed. Constitutional: She is oriented to person, place, and time. She appears well-developed and well-nourished. No distress.  Cardiovascular: Normal rate.   Pulmonary/Chest: Effort normal.  Abdominal: Soft. She exhibits no distension and no mass.  There is no tenderness. There is no rebound and no guarding.       Incision well healed   Genitourinary:    Uterus is not enlarged and not tender. Cervix exhibits no motion tenderness. Right adnexum displays no mass, no tenderness and no fullness. Left adnexum displays no mass, no tenderness and no fullness. There is tenderness (at introitus) around the vagina.  Musculoskeletal: Normal range of motion.  Neurological: She is alert and oriented to person, place, and time.  Skin: Skin is warm and dry.  Psychiatric: She has a normal mood and affect.    Assessment:    7 week postpartum exam. Pap smear not due at today's visit.   Plan:    1. Contraception: Gave 2 mo samples of LoEstrin 24, rev'd use and precautions, discussed Mirena and Implanon, will decide at next visit if she would like to switch methods 2. Depression - rx Prozac, rev'd precautions, call with worsening symptoms 3. Vaginitis - HSV culture, GC/CT, Wet prep today. Rx lidocaine gel, Mycolog and diflucan.  4. Migraines - refilled Imitrex 5. Follow up in: 1 month or as needed.  Reeval depression, vaginal pain, contraceptive mgmt at that time.

## 2012-07-24 NOTE — Patient Instructions (Signed)
Postpartum Depression and Baby Blues The postpartum period begins right after the birth of a baby. During this time, there is often a great amount of joy and excitement. It is also a time of considerable changes in the life of the parent(s). Regardless of how many times a mother gives birth, each child brings new challenges and dynamics to the family. It is not unusual to have feelings of excitement accompanied by confusing shifts in moods, emotions, and thoughts. All mothers are at risk of developing postpartum depression or the "baby blues." These mood changes can occur right after giving birth, or they may occur many months after giving birth. The baby blues or postpartum depression can be mild or severe. Additionally, postpartum depression can resolve rather quickly, or it can be a long-term condition. CAUSES Elevated hormones and their rapid decline are thought to be a main cause of postpartum depression and the baby blues. There are a number of hormones that radically change during and after pregnancy. Estrogen and progesterone usually decrease immediately after delivering your baby. The level of thyroid hormone and various cortisol steroids also rapidly drop. Other factors that play a major role in these changes include major life events and genetics.  RISK FACTORS If you have any of the following risks for the baby blues or postpartum depression, know what symptoms to watch out for during the postpartum period. Risk factors that may increase the likelihood of getting the baby blues or postpartum depression include:  Havinga personal or family history of depression.  Having depression while being pregnant.  Having premenstrual or oral contraceptive-associated mood issues.  Having exceptional life stress.  Having marital conflict.  Lacking a social support network.  Having a baby with special needs.  Having health problems such as diabetes. SYMPTOMS Baby blues symptoms include:  Brief  fluctuations in mood, such as going from extreme happiness to sadness.  Decreased concentration.  Difficulty sleeping.  Crying spells, tearfulness.  Irritability.  Anxiety. Postpartum depression symptoms typically begin within the first month after giving birth. These symptoms include:  Difficulty sleeping or excessive sleepiness.  Marked weight loss.  Agitation.  Feelings of worthlessness.  Lack of interest in activity or food. Postpartum psychosis is a very concerning condition and can be dangerous. Fortunately, it is rare. Displaying any of the following symptoms is cause for immediate medical attention. Postpartum psychosis symptoms include:  Hallucinations and delusions.  Bizarre or disorganized behavior.  Confusion or disorientation. DIAGNOSIS  A diagnosis is made by an evaluation of your symptoms. There are no medical or lab tests that lead to a diagnosis, but there are various questionnaires that a caregiver may use to identify those with the baby blues, postpartum depression, or psychosis. Often times, a screening tool called the Edinburgh Postnatal Depression Scale is used to diagnose depression in the postpartum period.  TREATMENT The baby blues usually goes away on its own in 1 to 2 weeks. Social support is often all that is needed. You should be encouraged to get adequate sleep and rest. Occasionally, you may be given medicines to help you sleep.  Postpartum depression requires treatment as it can last several months or longer if it is not treated. Treatment may include individual or group therapy, medicine, or both to address any social, physiological, and psychological factors that may play a role in the depression. Regular exercise, a healthy diet, rest, and social support may also be strongly recommended.  Postpartum psychosis is more serious and needs treatment right away. Hospitalization is   often needed. HOME CARE INSTRUCTIONS  Get as much rest as you can. Nap  when the baby sleeps.  Exercise regularly. Some women find yoga and walking to be beneficial.  Eat a balanced and nourishing diet.  Do little things that you enjoy. Have a cup of tea, take a bubble bath, read your favorite magazine, or listen to your favorite music.  Avoid alcohol.  Ask for help with household chores, cooking, grocery shopping, or running errands as needed. Do not try to do everything.  Talk to people close to you about how you are feeling. Get support from your partner, family members, friends, or other new moms.  Try to stay positive in how you think. Think about the things you are grateful for.  Do not spend a lot of time alone.  Only take medicine as directed by your caregiver.  Keep all your postpartum appointments.  Let your caregiver know if you have any concerns. SEEK MEDICAL CARE IF: You are having a reaction or problems with your medicine. SEEK IMMEDIATE MEDICAL CARE IF:  You have suicidal feelings.  You feel you may harm the baby or someone else. Document Released: 04/01/2004 Document Revised: 09/20/2011 Document Reviewed: 05/04/2011 [facility] Patient Information 2013 [place], [place].  Contraception Choices Contraception (birth control) is the use of any methods or devices to prevent pregnancy. Below are some methods to help avoid pregnancy. HORMONAL METHODS   Contraceptive implant. This is a thin, plastic tube containing progesterone hormone. It does not contain estrogen hormone. Your caregiver inserts the tube in the inner part of the upper arm. The tube can remain in place for up to 3 years. After 3 years, the implant must be removed. The implant prevents the ovaries from releasing an egg (ovulation), thickens the cervical mucus which prevents sperm from entering the uterus, and thins the lining of the inside of the uterus.  Progesterone-only injections. These injections are given every 3 months by your caregiver to prevent pregnancy. This  synthetic progesterone hormone stops the ovaries from releasing eggs. It also thickens cervical mucus and changes the uterine lining. This makes it harder for sperm to survive in the uterus.  Birth control pills. These pills contain estrogen and progesterone hormone. They work by stopping the egg from forming in the ovary (ovulation). Birth control pills are prescribed by a caregiver.Birth control pills can also be used to treat heavy periods.  Minipill. This type of birth control pill contains only the progesterone hormone. They are taken every day of each month and must be prescribed by your caregiver.  Birth control patch. The patch contains hormones similar to those in birth control pills. It must be changed once a week and is prescribed by a caregiver.  Vaginal ring. The ring contains hormones similar to those in birth control pills. It is left in the vagina for 3 weeks, removed for 1 week, and then a new one is put back in place. The patient must be comfortable inserting and removing the ring from the vagina.A caregiver's prescription is necessary.  Emergency contraception. Emergency contraceptives prevent pregnancy after unprotected sexual intercourse. This pill can be taken right after sex or up to 5 days after unprotected sex. It is most effective the sooner you take the pills after having sexual intercourse. Emergency contraceptive pills are available without a prescription. Check with your pharmacist. Do not use emergency contraception as your only form of birth control. BARRIER METHODS   Female condom. This is a thin sheath (latex or rubber)  that is worn over the penis during sexual intercourse. It can be used with spermicide to increase effectiveness.  Female condom. This is a soft, loose-fitting sheath that is put into the vagina before sexual intercourse.  Diaphragm. This is a soft, latex, dome-shaped barrier that must be fitted by a caregiver. It is inserted into the vagina, along  with a spermicidal jelly. It is inserted before intercourse. The diaphragm should be left in the vagina for 6 to 8 hours after intercourse.  Cervical cap. This is a round, soft, latex or plastic cup that fits over the cervix and must be fitted by a caregiver. The cap can be left in place for up to 48 hours after intercourse.  Sponge. This is a soft, circular piece of polyurethane foam. The sponge has spermicide in it. It is inserted into the vagina after wetting it and before sexual intercourse.  Spermicides. These are chemicals that kill or block sperm from entering the cervix and uterus. They come in the form of creams, jellies, suppositories, foam, or tablets. They do not require a prescription. They are inserted into the vagina with an applicator before having sexual intercourse. The process must be repeated every time you have sexual intercourse. INTRAUTERINE CONTRACEPTION  Intrauterine device (IUD). This is a T-shaped device that is put in a woman's uterus during a menstrual period to prevent pregnancy. There are 2 types:  Copper IUD. This type of IUD is wrapped in copper wire and is placed inside the uterus. Copper makes the uterus and fallopian tubes produce a fluid that kills sperm. It can stay in place for 10 years.  Hormone IUD. This type of IUD contains the hormone progestin (synthetic progesterone). The hormone thickens the cervical mucus and prevents sperm from entering the uterus, and it also thins the uterine lining to prevent implantation of a fertilized egg. The hormone can weaken or kill the sperm that get into the uterus. It can stay in place for 5 years. PERMANENT METHODS OF CONTRACEPTION  Female tubal ligation. This is when the woman's fallopian tubes are surgically sealed, tied, or blocked to prevent the egg from traveling to the uterus.  Female sterilization. This is when the female has the tubes that carry sperm tied off (vasectomy).This blocks sperm from entering the vagina  during sexual intercourse. After the procedure, the man can still ejaculate fluid (semen). NATURAL PLANNING METHODS  Natural family planning. This is not having sexual intercourse or using a barrier method (condom, diaphragm, cervical cap) on days the woman could become pregnant.  Calendar method. This is keeping track of the length of each menstrual cycle and identifying when you are fertile.  Ovulation method. This is avoiding sexual intercourse during ovulation.  Symptothermal method. This is avoiding sexual intercourse during ovulation, using a thermometer and ovulation symptoms.  Post-ovulation method. This is timing sexual intercourse after you have ovulated. Regardless of which type or method of contraception you choose, it is important that you use condoms to protect against the transmission of sexually transmitted diseases (STDs). Talk with your caregiver about which form of contraception is most appropriate for you. Document Released: 06/28/2005 Document Revised: 09/20/2011 Document Reviewed: 11/04/2010 [facility] Patient Information 2013 [place], [place].  Vaginitis Vaginitis is an infection. It causes soreness, swelling, and redness (inflammation) of the vagina. Many of these infections are sexually transmitted diseases (STDs). Having unprotected sex can cause further problems and complications such as:  Chronic pelvic pain.  Infertility.  Unwanted pregnancy.  Abortion.  Tubal pregnancy.  Infection passed on to the newborn.  Cancer. CAUSES   Monilia. This is a yeast or fungus infection, not an STD.  Bacterial vaginosis. The normal balance of bacteria in the vagina is disrupted and is replaced by an overgrowth of certain bacteria.  Gonorrhea, chlamydia. These are bacterial infections that are STDs.  Vaginal sponges, diaphragms, and intrauterine devices.  Trichomoniasis. This is a STD infection caused by a parasite.  Viruses like herpes and human papillomavirus.  Both are STDs.  Pregnancy.  Immunosuppression. This occurs with certain conditions such as HIV infection or cancer.  Using bubble bath.  Taking certain antibiotic medicines.  Sporadic recurrence can occur if you become sick.  Diabetes.  Steroids.  Allergic reaction. If you have an allergy to:  Douches.  Soaps.  Spermicides.  Condoms.  Scented tampons or vaginal sprays. SYMPTOMS   Abnormal vaginal discharge.  Itching of the vagina.  Pain in the vagina.  Swelling of the vagina. In some cases, there are no symptoms. TREATMENT  Treatment will vary depending on the type of infection.  Bacteria or trichomonas are usually treated with oral antibiotics and sometimes vaginal cream or suppositories.  Monilia vaginitis is usually treated with vaginal creams, suppositories, or oral antifungal pills.  Viral vaginitis has no cure. However, the symptoms of herpes (a viral vaginitis) can be treated to relieve the discomfort. Human papillomavirus has no symptoms. However, there are treatments for the diseases caused by human papillomavirus.  With allergic vaginitis, you need to stop using the product that is causing the problem. Vaginal creams can be used to treat the symptoms.  When treating an STD, the sex partner should also be treated. HOME CARE INSTRUCTIONS   Take all the medicines as directed by your caregiver.  Do not use scented tampons, soaps, or vaginal sprays.  Do not douche.  Tell your sex partner if you have a vaginal infection or an STD.  Do not have sexual intercourse until you have treated the vaginitis.  Practice safe sex by using condoms. SEEK MEDICAL CARE IF:   You have abdominal pain.  Your symptoms get worse during treatment. Document Released: 04/25/2007 Document Revised: 09/20/2011 Document Reviewed: 12/19/2008 [facility] Patient Information 2013 [address], [place].

## 2012-07-24 NOTE — Addendum Note (Signed)
Addended by: Franchot Mimes on: 07/24/2012 04:13 PM   Modules accepted: Orders

## 2012-07-26 ENCOUNTER — Telehealth: Payer: Self-pay | Admitting: *Deleted

## 2012-07-26 LAB — HERPES SIMPLEX VIRUS CULTURE: Organism ID, Bacteria: NOT DETECTED

## 2012-07-26 NOTE — Telephone Encounter (Signed)
Pt left message that her pharmacy did not receive the Rx for her depression medication. I called [facility] and spoke w/pharmacist Kathlene November. He stated that pt picked up the medication yesterday along with 2 others. I called pt and advised her to look at her meds- the one called fluoxetine is the depression medication- generic for Prozac.  Pt confirmed that she has this medication. Pt voiced understanding and had no further questions.

## 2012-07-29 ENCOUNTER — Other Ambulatory Visit: Payer: Self-pay | Admitting: Advanced Practice Midwife

## 2012-07-29 MED ORDER — METRONIDAZOLE 500 MG PO TABS: 500.0000 mg | ORAL_TABLET | Freq: Two times a day (BID) | ORAL | Status: DC

## 2012-07-29 NOTE — Progress Notes (Signed)
Wet prep + for BV - will send in rx for Flagyl 500 mg 1 po bid x 7 days.

## 2012-07-31 NOTE — Progress Notes (Signed)
Called [place] and left a message we are trying to reach you with some information from your provider- please call clinic and you may leave a message if there is a better number or time to reach you

## 2012-08-01 NOTE — Progress Notes (Signed)
Called pt and informed her of +BV results and RX sent to her pharmacy.  Pt states she has had BV in the past, voiced understanding and had no questions

## 2012-08-08 ENCOUNTER — Telehealth: Payer: Self-pay | Admitting: *Deleted

## 2012-08-08 NOTE — Telephone Encounter (Signed)
Pt left a message stating that she had a question about her medicine. She wanted to know if she needed to keep using the cream that was prescribed for her yeast infection. She states that she does still have itching and discharge . I advised that she finish up the cream and then contact us if her symptoms do not improve.

## 2012-08-13 ENCOUNTER — Emergency Department (HOSPITAL_COMMUNITY)
Admission: EM | Admit: 2012-08-13 | Discharge: 2012-08-13 | Disposition: A | Discharge status: Home / Self Care | Payer: Medicaid Other | Attending: Emergency Medicine | Admitting: Emergency Medicine

## 2012-08-13 ENCOUNTER — Encounter (HOSPITAL_COMMUNITY): Payer: Self-pay | Admitting: Emergency Medicine

## 2012-08-13 DIAGNOSIS — F329 Major depressive disorder, single episode, unspecified: Secondary | ICD-10-CM | POA: Insufficient documentation

## 2012-08-13 DIAGNOSIS — D649 Anemia, unspecified: Secondary | ICD-10-CM | POA: Insufficient documentation

## 2012-08-13 DIAGNOSIS — Z3202 Encounter for pregnancy test, result negative: Secondary | ICD-10-CM | POA: Insufficient documentation

## 2012-08-13 DIAGNOSIS — Z8669 Personal history of other diseases of the nervous system and sense organs: Secondary | ICD-10-CM | POA: Insufficient documentation

## 2012-08-13 DIAGNOSIS — R110 Nausea: Secondary | ICD-10-CM | POA: Insufficient documentation

## 2012-08-13 DIAGNOSIS — G43909 Migraine, unspecified, not intractable, without status migrainosus: Secondary | ICD-10-CM | POA: Insufficient documentation

## 2012-08-13 DIAGNOSIS — F3289 Other specified depressive episodes: Secondary | ICD-10-CM | POA: Insufficient documentation

## 2012-08-13 DIAGNOSIS — Z872 Personal history of diseases of the skin and subcutaneous tissue: Secondary | ICD-10-CM | POA: Insufficient documentation

## 2012-08-13 DIAGNOSIS — Z8619 Personal history of other infectious and parasitic diseases: Secondary | ICD-10-CM | POA: Insufficient documentation

## 2012-08-13 DIAGNOSIS — Z87440 Personal history of urinary (tract) infections: Secondary | ICD-10-CM | POA: Insufficient documentation

## 2012-08-13 DIAGNOSIS — Z8659 Personal history of other mental and behavioral disorders: Secondary | ICD-10-CM | POA: Insufficient documentation

## 2012-08-13 LAB — POCT PREGNANCY, URINE: Preg Test, Ur: NEGATIVE

## 2012-08-13 MED ORDER — DIPHENHYDRAMINE HCL 50 MG/ML IJ SOLN
25.0000 mg | Freq: Once | INTRAMUSCULAR | Status: DC
  Filled 2012-08-13: qty 1

## 2012-08-13 MED ORDER — METOCLOPRAMIDE HCL 5 MG/ML IJ SOLN
10.0000 mg | Freq: Once | INTRAMUSCULAR | Status: AC
  Administered 2012-08-13: 10 mg via INTRAMUSCULAR

## 2012-08-13 MED ORDER — SODIUM CHLORIDE 0.9 % IV BOLUS (SEPSIS): 1000.0000 mL | Freq: Once | INTRAVENOUS | Status: DC

## 2012-08-13 MED ORDER — KETOROLAC TROMETHAMINE 30 MG/ML IJ SOLN
30.0000 mg | Freq: Once | INTRAMUSCULAR | Status: DC
  Filled 2012-08-13 (×2): qty 1

## 2012-08-13 MED ORDER — METOCLOPRAMIDE HCL 5 MG/ML IJ SOLN
10.0000 mg | Freq: Once | INTRAMUSCULAR | Status: DC
  Filled 2012-08-13: qty 2

## 2012-08-13 MED ORDER — METOCLOPRAMIDE HCL 5 MG/ML IJ SOLN: 10.0000 mg | Freq: Once | INTRAMUSCULAR | Status: DC

## 2012-08-13 MED ORDER — DIPHENHYDRAMINE HCL 50 MG/ML IJ SOLN
25.0000 mg | Freq: Once | INTRAMUSCULAR | Status: AC
Start: 2012-08-13 — End: 2012-08-13
  Administered 2012-08-13: 25 mg via INTRAMUSCULAR

## 2012-08-13 MED ORDER — KETOROLAC TROMETHAMINE 30 MG/ML IJ SOLN
60.0000 mg | Freq: Once | INTRAMUSCULAR | Status: AC
  Administered 2012-08-13: 60 mg via INTRAMUSCULAR

## 2012-08-13 NOTE — ED Provider Notes (Signed)
History     CSN: [id number]  Arrival date & time 08/13/12  1516   First MD Initiated Contact with Patient 08/13/12 1528      Chief Complaint  Patient presents with  . Migraine    (Consider location/radiation/quality/duration/timing/severity/associated sxs/prior treatment) HPI Comments: Pt states that she has a history of migraines and she has been off her medications for over a year because of pregnancy:pt states that her migraines bring on seizure:pt denies having a seizure recently:pt states that this is her normal migraine  Patient is a 22 y.o. female presenting with migraines. The history is provided by the patient. No language interpreter was used.  Migraine This is a recurrent problem. The problem occurs constantly. Associated symptoms include nausea. Pertinent negatives include no fever or vomiting. Nothing aggravates the symptoms. She has tried nothing for the symptoms.    Past Medical History  Diagnosis Date  . Anemia   . Depression   . Complication of anesthesia     ??, seizure with wisdom teeth  . Urinary tract infection   . Psoriasis   . PID (acute pelvic inflammatory disease)   . Chlamydia   . PONV (postoperative nausea and vomiting)   . Seizures     July 2013 - [place]  . Headache     otc med prn  . Anxiety     Past Surgical History  Procedure Date  . Cesarean section   . Skin graft     off abd, onto arm  . Wisdom tooth extraction   . Cesarean section 06/06/2012    Procedure: CESAREAN SECTION;  Surgeon: Adam Phenix, MD;  Location: [facility];  Service: Obstetrics;  Laterality: N/A;    Family History  Problem Relation Age of Onset  . Hypertension Mother   . Diabetes Mother   . Cancer Mother     BREAST  . Stroke Mother   . Seizures Mother   . Asthma Daughter   . Hypertension Maternal Grandmother   . Diabetes Maternal Grandmother   . Cancer Maternal Grandmother     BONE CANCER  . Other Neg Hx     History  Substance Use Topics  .  Smoking status: Never Smoker   . Smokeless tobacco: Never Used  . Alcohol Use: Yes     Comment: socially but none with pregnancy    OB History    Grav Para Term Preterm Abortions TAB SAB Ect Mult Living   2 2 2  0 0 0 0 0 0 2      Review of Systems  Constitutional: Negative for fever.  Respiratory: Negative.   Cardiovascular: Negative.   Gastrointestinal: Positive for nausea. Negative for vomiting.    Allergies  Review of patient's allergies indicates no known allergies.  Home Medications   Current Outpatient Rx  Name  Route  Sig  Dispense  Refill  . DOCUSATE SODIUM 100 MG PO CAPS   Oral   Take 100 mg by mouth 2 (two) times daily as needed. Constipation         . FERROUS GLUCONATE 325 MG PO TABS   Oral   Take 325 mg by mouth 3 (three) times daily with meals.         Marland Kitchen FLUOXETINE HCL 20 MG PO CAPS   Oral   Take 20 mg by mouth daily.         . IBUPROFEN 600 MG PO TABS   Oral   Take 1 tablet (600 mg total) by mouth  every 6 (six) hours as needed for pain.   30 tablet   0   . METRONIDAZOLE 500 MG PO TABS   Oral   Take 500 mg by mouth 2 (two) times daily.         . SUMATRIPTAN SUCCINATE 100 MG PO TABS   Oral   Take 100 mg by mouth once as needed. Migraines           Breastfeeding? No  Physical Exam  Nursing note and vitals reviewed. Constitutional: She is oriented to person, place, and time. She appears well-developed and well-nourished.  HENT:  Head: Normocephalic and atraumatic.  Eyes: Conjunctivae normal and EOM are normal.  Neck: Normal range of motion. Neck supple.  Cardiovascular: Normal rate and regular rhythm.   Pulmonary/Chest: Effort normal and breath sounds normal.  Musculoskeletal: Normal range of motion.  Neurological: She is alert and oriented to person, place, and time. Coordination normal.  Skin: Skin is warm and dry.  Psychiatric: She has a normal mood and affect.    ED Course  Procedures (including critical care  time)  Labs Reviewed - No data to display No results found.   1. Migraine       MDM  Pt is feeling better after toradol, reglan and benadryl        Teressa Lower, NP 08/13/12 1842

## 2012-08-13 NOTE — ED Notes (Signed)
[id number]<AV> Expected date:08/13/12<BR> Expected time: 3:18 PM<BR> Means of arrival:Ambulance<BR> Comments:<BR> Migraine

## 2012-08-13 NOTE — ED Notes (Signed)
Pt and family verbalize understanding of d/c instructions, pt ambulatory to d/c window.

## 2012-08-13 NOTE — ED Notes (Signed)
Pt from home via EMS c/o migraine. Pt has hx of seizures. Pt gave birth in November, stopped taking meds during pregnancy. Pt has never returned to PCP to get medications for migraine and seizures refilled, pt off meds for approx 1 yr. Pt in NAD. Pt adds that she has not had seizure for a couple of months

## 2012-08-13 NOTE — ED Provider Notes (Signed)
Medical screening examination/treatment/procedure(s) were performed by non-physician practitioner and as supervising physician I was immediately available for consultation/collaboration.   Hattie Pine J. Darrnell Mangiaracina, MD 08/13/12 1914 

## 2012-08-21 ENCOUNTER — Ambulatory Visit: Payer: Medicaid Other | Admitting: Advanced Practice Midwife

## 2012-08-30 ENCOUNTER — Ambulatory Visit: Payer: Medicaid Other | Admitting: Obstetrics and Gynecology

## 2012-08-31 ENCOUNTER — Ambulatory Visit: Payer: Medicaid Other | Admitting: Medical

## 2012-10-11 ENCOUNTER — Emergency Department (HOSPITAL_COMMUNITY)
Admission: EM | Admit: 2012-10-11 | Discharge: 2012-10-11 | Disposition: A | Discharge status: Home / Self Care | Payer: Self-pay | Attending: Emergency Medicine | Admitting: Emergency Medicine

## 2012-10-11 ENCOUNTER — Emergency Department (HOSPITAL_COMMUNITY): Payer: Self-pay

## 2012-10-11 ENCOUNTER — Encounter (HOSPITAL_COMMUNITY): Payer: Self-pay | Admitting: Emergency Medicine

## 2012-10-11 DIAGNOSIS — Z872 Personal history of diseases of the skin and subcutaneous tissue: Secondary | ICD-10-CM | POA: Insufficient documentation

## 2012-10-11 DIAGNOSIS — X500XXA Overexertion from strenuous movement or load, initial encounter: Secondary | ICD-10-CM | POA: Insufficient documentation

## 2012-10-11 DIAGNOSIS — Z87440 Personal history of urinary (tract) infections: Secondary | ICD-10-CM | POA: Insufficient documentation

## 2012-10-11 DIAGNOSIS — Z8619 Personal history of other infectious and parasitic diseases: Secondary | ICD-10-CM | POA: Insufficient documentation

## 2012-10-11 DIAGNOSIS — Z79899 Other long term (current) drug therapy: Secondary | ICD-10-CM | POA: Insufficient documentation

## 2012-10-11 DIAGNOSIS — Y9389 Activity, other specified: Secondary | ICD-10-CM | POA: Insufficient documentation

## 2012-10-11 DIAGNOSIS — D649 Anemia, unspecified: Secondary | ICD-10-CM | POA: Insufficient documentation

## 2012-10-11 DIAGNOSIS — W108XXA Fall (on) (from) other stairs and steps, initial encounter: Secondary | ICD-10-CM | POA: Insufficient documentation

## 2012-10-11 DIAGNOSIS — Z8659 Personal history of other mental and behavioral disorders: Secondary | ICD-10-CM | POA: Insufficient documentation

## 2012-10-11 DIAGNOSIS — S93409A Sprain of unspecified ligament of unspecified ankle, initial encounter: Secondary | ICD-10-CM | POA: Insufficient documentation

## 2012-10-11 DIAGNOSIS — Z8669 Personal history of other diseases of the nervous system and sense organs: Secondary | ICD-10-CM | POA: Insufficient documentation

## 2012-10-11 DIAGNOSIS — Y9289 Other specified places as the place of occurrence of the external cause: Secondary | ICD-10-CM | POA: Insufficient documentation

## 2012-10-11 DIAGNOSIS — S93401A Sprain of unspecified ligament of right ankle, initial encounter: Secondary | ICD-10-CM

## 2012-10-11 DIAGNOSIS — Z8742 Personal history of other diseases of the female genital tract: Secondary | ICD-10-CM | POA: Insufficient documentation

## 2012-10-11 MED ORDER — HYDROCODONE-ACETAMINOPHEN 5-325 MG PO TABS: 1.0000 | ORAL_TABLET | Freq: Four times a day (QID) | ORAL | Status: DC | PRN

## 2012-10-11 NOTE — ED Notes (Signed)
Pt in by ems. C/o R ankle pain after slipping down some stairs. Ems denies swelling/deformity. Pt able to ambulate on ankle after incident.

## 2012-10-11 NOTE — ED Provider Notes (Signed)
History     CSN: [id number]  Arrival date & time 10/11/12  1109   First MD Initiated Contact with Patient 10/11/12 1123      Chief Complaint  Patient presents with  . Ankle Pain    (Consider location/radiation/quality/duration/timing/severity/associated sxs/prior treatment) HPI Comments: Patient presents today with right ankle pain.  She reports that earlier today she tripped town a couple of stairs.  She twisted her ankle when she fell.  She denies hitting her head.  No LOC.  She was able to ambulate some immediately after the fall, but the pain has gotten worse since that time.  Pain located over the lateral malleolus.  She has also had mild swelling.  She has taken Ibuprofen without relief.  She denies numbness or tingling.  She is not having any pain anywhere besides the ankle.  The history is provided by the patient.    Past Medical History  Diagnosis Date  . Anemia   . Depression   . Complication of anesthesia     ??, seizure with wisdom teeth  . Urinary tract infection   . Psoriasis   . PID (acute pelvic inflammatory disease)   . Chlamydia   . PONV (postoperative nausea and vomiting)   . Seizures     July 2013 - [place]  . Headache     otc med prn  . Anxiety     Past Surgical History  Procedure Laterality Date  . Cesarean section    . Skin graft      off abd, onto arm  . Wisdom tooth extraction    . Cesarean section  06/06/2012    Procedure: CESAREAN SECTION;  Surgeon: Adam Phenix, MD;  Location: [facility];  Service: Obstetrics;  Laterality: N/A;    Family History  Problem Relation Age of Onset  . Hypertension Mother   . Diabetes Mother   . Cancer Mother     BREAST  . Stroke Mother   . Seizures Mother   . Asthma Daughter   . Hypertension Maternal Grandmother   . Diabetes Maternal Grandmother   . Cancer Maternal Grandmother     BONE CANCER  . Other Neg Hx     History  Substance Use Topics  . Smoking status: Never Smoker   . Smokeless  tobacco: Never Used  . Alcohol Use: Yes     Comment: socially but none with pregnancy    OB History   Grav Para Term Preterm Abortions TAB SAB Ect Mult Living   2 2 2  0 0 0 0 0 0 2      Review of Systems  Musculoskeletal:       Right ankle pain  Skin: Negative for color change and wound.  Neurological: Negative for numbness.    Allergies  Review of patient's allergies indicates no known allergies.  Home Medications   Current Outpatient Rx  Name  Route  Sig  Dispense  Refill  . docusate sodium (COLACE) 100 MG capsule   Oral   Take 100 mg by mouth 2 (two) times daily as needed. Constipation         . ferrous gluconate (FERGON) 325 MG tablet   Oral   Take 325 mg by mouth 3 (three) times daily with meals.         Marland Kitchen ibuprofen (ADVIL,MOTRIN) 600 MG tablet   Oral   Take 1 tablet (600 mg total) by mouth every 6 (six) hours as needed for pain.   30 tablet  0   . SUMAtriptan (IMITREX) 100 MG tablet   Oral   Take 100 mg by mouth once as needed. Migraines         . metroNIDAZOLE (FLAGYL) 500 MG tablet   Oral   Take 500 mg by mouth 2 (two) times daily.           BP 127/73  Pulse 85  Temp(Src) 98.7 F (37.1 C) (Oral)  Resp 16  SpO2 99%  Physical Exam  Nursing note and vitals reviewed. Constitutional: She appears well-developed and well-nourished. No distress.  HENT:  Head: Normocephalic and atraumatic.  Neck: Normal range of motion. Neck supple.  Cardiovascular: Normal rate, regular rhythm and normal heart sounds.   Pulses:      Dorsalis pedis pulses are 2+ on the right side, and 2+ on the left side.  Pulmonary/Chest: Effort normal and breath sounds normal.  Musculoskeletal:       Right ankle: She exhibits no ecchymosis, no deformity and normal pulse. Tenderness. Lateral malleolus tenderness found. No medial malleolus tenderness found.  Pain with ROM of the right ankle Mild swelling of the lateral malleolus.   Neurological: She is alert. No sensory  deficit.  Skin: Skin is warm and dry. No abrasion, no bruising, no ecchymosis and no laceration noted. She is not diaphoretic.  Psychiatric: She has a normal mood and affect.    ED Course  Procedures (including critical care time)  Labs Reviewed - No data to display Dg Ankle Complete Right  10/11/2012  *RADIOLOGY REPORT*  Clinical Data:  Fall.  Pain and swelling  RIGHT ANKLE - COMPLETE 3+ VIEW  Comparison:  None.  Findings:  There is no evidence of fracture, dislocation, or joint effusion.  There is no evidence of arthropathy or other focal bone abnormality.  Soft tissues are unremarkable.  IMPRESSION: Negative.   Original Report Authenticated By: Janeece Riggers, M.D.      No diagnosis found.    MDM  Patient presenting with ankle pain that began after she twisted her ankle while falling down the stairs.  Xray negative.  Patient neurovascularly intact.  Patient given Ankle ASO and crutches.        Pascal Lux Cana, PA-C 10/11/12 2202

## 2012-10-12 NOTE — ED Provider Notes (Signed)
Medical screening examination/treatment/procedure(s) were performed by non-physician practitioner and as supervising physician I was immediately available for consultation/collaboration.  Brityn Mastrogiovanni R. Lorraine Terriquez, MD 10/12/12 1540 

## 2013-02-21 ENCOUNTER — Emergency Department (HOSPITAL_COMMUNITY)
Admission: EM | Admit: 2013-02-21 | Discharge: 2013-02-21 | Disposition: A | Discharge status: Home / Self Care | Payer: Medicaid Other | Attending: Emergency Medicine | Admitting: Emergency Medicine

## 2013-02-21 ENCOUNTER — Encounter (HOSPITAL_COMMUNITY): Payer: Self-pay | Admitting: Emergency Medicine

## 2013-02-21 ENCOUNTER — Emergency Department (HOSPITAL_COMMUNITY): Payer: Medicaid Other

## 2013-02-21 DIAGNOSIS — Z8619 Personal history of other infectious and parasitic diseases: Secondary | ICD-10-CM | POA: Insufficient documentation

## 2013-02-21 DIAGNOSIS — Z87440 Personal history of urinary (tract) infections: Secondary | ICD-10-CM | POA: Insufficient documentation

## 2013-02-21 DIAGNOSIS — D649 Anemia, unspecified: Secondary | ICD-10-CM | POA: Insufficient documentation

## 2013-02-21 DIAGNOSIS — F411 Generalized anxiety disorder: Secondary | ICD-10-CM | POA: Insufficient documentation

## 2013-02-21 DIAGNOSIS — N739 Female pelvic inflammatory disease, unspecified: Secondary | ICD-10-CM | POA: Insufficient documentation

## 2013-02-21 DIAGNOSIS — F3289 Other specified depressive episodes: Secondary | ICD-10-CM | POA: Insufficient documentation

## 2013-02-21 DIAGNOSIS — L408 Other psoriasis: Secondary | ICD-10-CM | POA: Insufficient documentation

## 2013-02-21 DIAGNOSIS — Z3202 Encounter for pregnancy test, result negative: Secondary | ICD-10-CM | POA: Insufficient documentation

## 2013-02-21 DIAGNOSIS — F329 Major depressive disorder, single episode, unspecified: Secondary | ICD-10-CM | POA: Insufficient documentation

## 2013-02-21 DIAGNOSIS — Z8669 Personal history of other diseases of the nervous system and sense organs: Secondary | ICD-10-CM | POA: Insufficient documentation

## 2013-02-21 DIAGNOSIS — R569 Unspecified convulsions: Secondary | ICD-10-CM | POA: Insufficient documentation

## 2013-02-21 DIAGNOSIS — N390 Urinary tract infection, site not specified: Secondary | ICD-10-CM

## 2013-02-21 LAB — OCCULT BLOOD, POC DEVICE: Fecal Occult Bld: POSITIVE — AB

## 2013-02-21 LAB — URINALYSIS, ROUTINE W REFLEX MICROSCOPIC
Bilirubin Urine: NEGATIVE
Glucose, UA: NEGATIVE mg/dL
Ketones, ur: NEGATIVE mg/dL
Nitrite: NEGATIVE
Protein, ur: NEGATIVE mg/dL
Specific Gravity, Urine: 1.023 (ref 1.005–1.030)
Urobilinogen, UA: 1 mg/dL (ref 0.0–1.0)
pH: 6.5 (ref 5.0–8.0)

## 2013-02-21 LAB — WET PREP, GENITAL
Clue Cells Wet Prep HPF POC: NONE SEEN
Trich, Wet Prep: NONE SEEN
Yeast Wet Prep HPF POC: NONE SEEN

## 2013-02-21 LAB — CBC WITH DIFFERENTIAL/PLATELET
Basophils Absolute: 0 10*3/uL (ref 0.0–0.1)
Basophils Relative: 0 % (ref 0–1)
Eosinophils Absolute: 0.1 10*3/uL (ref 0.0–0.7)
Eosinophils Relative: 1 % (ref 0–5)
HCT: 36.9 % (ref 36.0–46.0)
Hemoglobin: 12.3 g/dL (ref 12.0–15.0)
Lymphocytes Relative: 27 % (ref 12–46)
Lymphs Abs: 1.3 10*3/uL (ref 0.7–4.0)
MCH: 26.9 pg (ref 26.0–34.0)
MCHC: 33.3 g/dL (ref 30.0–36.0)
MCV: 80.6 fL (ref 78.0–100.0)
Monocytes Absolute: 0.6 10*3/uL (ref 0.1–1.0)
Monocytes Relative: 12 % (ref 3–12)
Neutro Abs: 2.9 10*3/uL (ref 1.7–7.7)
Neutrophils Relative %: 60 % (ref 43–77)
Platelets: 188 10*3/uL (ref 150–400)
RBC: 4.58 MIL/uL (ref 3.87–5.11)
RDW: 14.7 % (ref 11.5–15.5)
WBC: 4.8 10*3/uL (ref 4.0–10.5)

## 2013-02-21 LAB — COMPREHENSIVE METABOLIC PANEL
ALT: 15 U/L (ref 0–35)
AST: 30 U/L (ref 0–37)
Albumin: 3.6 g/dL (ref 3.5–5.2)
Alkaline Phosphatase: 68 U/L (ref 39–117)
BUN: 11 mg/dL (ref 6–23)
CO2: 26 mEq/L (ref 19–32)
Calcium: 9.3 mg/dL (ref 8.4–10.5)
Chloride: 103 mEq/L (ref 96–112)
Creatinine, Ser: 0.68 mg/dL (ref 0.50–1.10)
GFR calc Af Amer: >90 mL/min (ref >90)
GFR calc non Af Amer: >90 mL/min (ref >90)
Glucose, Bld: 79 mg/dL (ref 70–99)
Potassium: 5.7 mEq/L — ABNORMAL HIGH (ref 3.5–5.1)
Sodium: 137 mEq/L (ref 135–145)
Total Bilirubin: 0.3 mg/dL (ref 0.3–1.2)
Total Protein: 7.4 g/dL (ref 6.0–8.3)

## 2013-02-21 LAB — URINE MICROSCOPIC-ADD ON

## 2013-02-21 LAB — LIPASE, BLOOD: Lipase: 22 U/L (ref 11–59)

## 2013-02-21 LAB — POCT PREGNANCY, URINE: Preg Test, Ur: NEGATIVE

## 2013-02-21 LAB — POTASSIUM: Potassium: 3.7 mEq/L (ref 3.5–5.1)

## 2013-02-21 MED ORDER — IOHEXOL 300 MG/ML  SOLN
100.0000 mL | Freq: Once | INTRAMUSCULAR | Status: AC | PRN
  Administered 2013-02-21: 100 mL via INTRAVENOUS

## 2013-02-21 MED ORDER — HYDROMORPHONE HCL PF 1 MG/ML IJ SOLN
1.0000 mg | INTRAMUSCULAR | Status: AC
  Administered 2013-02-21: 1 mg via INTRAVENOUS
  Filled 2013-02-21: qty 1

## 2013-02-21 MED ORDER — CEFTRIAXONE SODIUM 250 MG IJ SOLR
250.0000 mg | Freq: Once | INTRAMUSCULAR | Status: AC
  Administered 2013-02-21: 250 mg via INTRAMUSCULAR
  Filled 2013-02-21: qty 250

## 2013-02-21 MED ORDER — SODIUM CHLORIDE 0.9 % IV BOLUS (SEPSIS)
1000.0000 mL | Freq: Once | INTRAVENOUS | Status: AC
  Administered 2013-02-21: 1000 mL via INTRAVENOUS

## 2013-02-21 MED ORDER — LIDOCAINE HCL (PF) 1 % IJ SOLN
INTRAMUSCULAR | Status: AC
  Filled 2013-02-21: qty 5

## 2013-02-21 MED ORDER — DOXYCYCLINE HYCLATE 100 MG PO CAPS: 100.0000 mg | ORAL_CAPSULE | Freq: Two times a day (BID) | ORAL | Status: DC

## 2013-02-21 MED ORDER — LIDOCAINE HCL (PF) 1 % IJ SOLN
INTRAMUSCULAR | Status: AC
  Administered 2013-02-21: 0.9 mL
  Filled 2013-02-21: qty 5

## 2013-02-21 MED ORDER — OXYCODONE-ACETAMINOPHEN 5-325 MG PO TABS: 1.0000 | ORAL_TABLET | Freq: Four times a day (QID) | ORAL | Status: DC | PRN

## 2013-02-21 MED ORDER — ONDANSETRON HCL 4 MG/2ML IJ SOLN
4.0000 mg | Freq: Once | INTRAMUSCULAR | Status: AC
  Administered 2013-02-21: 4 mg via INTRAVENOUS
  Filled 2013-02-21 (×2): qty 2

## 2013-02-21 MED ORDER — DOXYCYCLINE HYCLATE 100 MG PO TABS
100.0000 mg | ORAL_TABLET | ORAL | Status: AC
  Administered 2013-02-21: 100 mg via ORAL
  Filled 2013-02-21: qty 1

## 2013-02-21 MED ORDER — IOHEXOL 300 MG/ML  SOLN
25.0000 mL | INTRAMUSCULAR | Status: AC
  Administered 2013-02-21: 25 mL via ORAL

## 2013-02-21 MED ORDER — CEPHALEXIN 500 MG PO CAPS: 1000.0000 mg | ORAL_CAPSULE | Freq: Two times a day (BID) | ORAL | Status: DC

## 2013-02-21 NOTE — ED Notes (Signed)
States had vag d/c last week

## 2013-02-21 NOTE — ED Notes (Signed)
Pt coming via [facility] with right flank pain into abdomen; hematinuria x 1 day. Has been taking ibuprofen but no relief.

## 2013-02-21 NOTE — ED Notes (Signed)
Pt family member came to desk requesting food for pt. MD made aware sts pt NPO at present time. Pt. Made aware. Requesting pain medication. Romeo Apple MD made aware.

## 2013-02-21 NOTE — ED Provider Notes (Signed)
CSN: [id number]     Arrival date & time 02/21/13  1317 History     First MD Initiated Contact with Patient 02/21/13 1451     Chief Complaint  Patient presents with  . Abdominal Pain   (Consider location/radiation/quality/duration/timing/severity/associated sxs/prior Treatment) Patient is a 22 y.o. female presenting with abdominal pain. The history is provided by the patient.  Abdominal Pain Pain location:  L flank and R flank Pain quality: aching, dull and sharp (occasionally)   Pain radiates to:  Does not radiate Pain severity:  Moderate Onset quality:  Gradual Duration:  3 weeks Timing:  Constant Progression:  Worsening Chronicity:  New Relieved by:  Nothing Worsened by:  Nothing tried Ineffective treatments:  NSAIDs Associated symptoms: dysuria, hematuria and vaginal discharge   Associated symptoms: no chest pain, no cough, no diarrhea, no fatigue, no fever, no nausea, no shortness of breath and no vomiting     Past Medical History  Diagnosis Date  . Anemia   . Depression   . Complication of anesthesia     ??, seizure with wisdom teeth  . Urinary tract infection   . Psoriasis   . PID (acute pelvic inflammatory disease)   . Chlamydia   . PONV (postoperative nausea and vomiting)   . Seizures     July 2013 - [place]  . Headache(784.0)     otc med prn  . Anxiety    Past Surgical History  Procedure Laterality Date  . Cesarean section    . Skin graft      off abd, onto arm  . Wisdom tooth extraction    . Cesarean section  06/06/2012    Procedure: CESAREAN SECTION;  Surgeon: Adam Phenix, MD;  Location: [facility];  Service: Obstetrics;  Laterality: N/A;   Family History  Problem Relation Age of Onset  . Hypertension Mother   . Diabetes Mother   . Cancer Mother     BREAST  . Stroke Mother   . Seizures Mother   . Asthma Daughter   . Hypertension Maternal Grandmother   . Diabetes Maternal Grandmother   . Cancer Maternal Grandmother     BONE CANCER  .  Other Neg Hx    History  Substance Use Topics  . Smoking status: Never Smoker   . Smokeless tobacco: Never Used  . Alcohol Use: Yes     Comment: socially but none with pregnancy   OB History   Grav Para Term Preterm Abortions TAB SAB Ect Mult Living   2 2 2  0 0 0 0 0 0 2     Review of Systems  Constitutional: Negative for fever and fatigue.  HENT: Negative for congestion, drooling and neck pain.   Eyes: Negative for pain.  Respiratory: Negative for cough and shortness of breath.   Cardiovascular: Negative for chest pain.  Gastrointestinal: Positive for abdominal pain. Negative for nausea, vomiting and diarrhea.  Genitourinary: Positive for dysuria, hematuria, flank pain and vaginal discharge.  Musculoskeletal: Negative for back pain and gait problem.  Skin: Negative for color change.  Neurological: Negative for dizziness and headaches.  Hematological: Negative for adenopathy.  Psychiatric/Behavioral: Negative for behavioral problems.  All other systems reviewed and are negative.    Allergies  Review of patient's allergies indicates no known allergies.  Home Medications   Current Outpatient Rx  Name  Route  Sig  Dispense  Refill  . ibuprofen (ADVIL,MOTRIN) 200 MG tablet   Oral   Take 200 mg by mouth every  6 (six) hours as needed for pain.          BP 121/77  Pulse 84  Temp(Src) 98.3 F (36.8 C)  Resp 16  SpO2 98% Physical Exam  Nursing note and vitals reviewed. Constitutional: She is oriented to person, place, and time. She appears well-developed and well-nourished.  HENT:  Head: Normocephalic.  Mouth/Throat: No oropharyngeal exudate.  Eyes: Conjunctivae and EOM are normal. Pupils are equal, round, and reactive to light.  Neck: Normal range of motion. Neck supple.  Cardiovascular: Normal rate, regular rhythm, normal heart sounds and intact distal pulses.  Exam reveals no gallop and no friction rub.   No murmur heard. Pulmonary/Chest: Effort normal and  breath sounds normal. No respiratory distress. She has no wheezes.  Abdominal: Soft. Bowel sounds are normal. There is no tenderness. There is no rebound and no guarding.  Genitourinary:  Normal appearing external vagina w/ mild amount of wet blood surrounding the introitus. Normal vaginal walls. Normal appearing cervix, os closed. Mild to mod amount of dark blood in posterior fornix. Mild CMT, diffuse mild ttp on bimanual.   Normal rectal exam. Small amount of blood on finger which I suspect is actually from her vagina and has leaked backwards contaminating the rectal area.   Musculoskeletal: Normal range of motion. She exhibits no edema and no tenderness.  No CVA ttp bilaterally. Mild to moderate left flank ttp. Mild ttp of suprapubic area.   Neurological: She is alert and oriented to person, place, and time.  Skin: Skin is warm and dry.  Psychiatric: She has a normal mood and affect. Her behavior is normal.    ED Course   Procedures (including critical care time)  Labs Reviewed  WET PREP, GENITAL - Abnormal; Notable for the following:    WBC, Wet Prep HPF POC FEW (*)    All other components within normal limits  URINALYSIS, ROUTINE W REFLEX MICROSCOPIC - Abnormal; Notable for the following:    APPearance CLOUDY (*)    Hgb urine dipstick LARGE (*)    Leukocytes, UA SMALL (*)    All other components within normal limits  URINE MICROSCOPIC-ADD ON - Abnormal; Notable for the following:    Bacteria, UA FEW (*)    All other components within normal limits  COMPREHENSIVE METABOLIC PANEL - Abnormal; Notable for the following:    Potassium 5.7 (*)    All other components within normal limits  OCCULT BLOOD, POC DEVICE - Abnormal; Notable for the following:    Fecal Occult Bld POSITIVE (*)    All other components within normal limits  GC/CHLAMYDIA PROBE AMP  CBC WITH DIFFERENTIAL  LIPASE, BLOOD  POTASSIUM  POCT PREGNANCY, URINE   Ct Abdomen Pelvis W Contrast  02/21/2013   *RADIOLOGY  REPORT*  Clinical Data: Right flank pain with hematuria for 1 day.  Vaginal discharge and constipation.  CT ABDOMEN AND PELVIS WITH CONTRAST  Technique:  Multidetector CT imaging of the abdomen and pelvis was performed following the standard protocol during bolus administration of intravenous contrast.  Contrast: OMNIPAQUE IOHEXOL 300 MG/ML  SOLN  Comparison: Abdominal pelvic CT 06/17/2012.  Findings: Minimal dependent atelectasis at both lung bases.  No significant pleural or pericardial effusion.  There is a 2 mm nonobstructing calculus in the upper pole of the right kidney on image number 26.  No other renal calculi demonstrated.  There is no hydronephrosis.  There is no evidence of renal or bladder calculus.  No focal renal abnormalities are seen.  The liver, gallbladder, pancreas, spleen and adrenal glands appear normal.  The stomach, small bowel and colon appear normal.  The appendix appears normal, best seen on the coronal images. The uterus, ovaries and bladder appear normal.  There are no worrisome osseous findings.  IMPRESSION:  1.  Nonobstructing right renal calculus.  No evidence of ureteral calculus or hydronephrosis. 2.  No inflammatory changes demonstrated.   Original Report Authenticated By: Carey Bullocks, M.D.   1. PID (pelvic inflammatory disease)   2. UTI (lower urinary tract infection)     MDM  3:17 PM 22 y.o. female w hx of UTI, PID pw constant bilateral flank pain x 3 weeks. Denies fever, vomiting. Has had dark stools. AFVSS here. Ddx includes pid, pyelonephritis, std, stone.   10:28 PM: CT imaging/labs non-contrib. Possibly early pyelo vs pid. Will tx for both. Rocephin x 1 here, doxy and keflex for home. I have discussed the diagnosis/risks/treatment options with the patient and believe the pt to be eligible for discharge home to follow-up with pcp as needed. We also discussed returning to the ED immediately if new or worsening sx occur. We discussed the sx which are most  concerning (e.g., worsening pain, fever, inability to take ax) that necessitate immediate return. Any new prescriptions provided to the patient are listed below.  Discharge Medication List as of 02/21/2013 10:46 PM    START taking these medications   Details  cephALEXin (KEFLEX) 500 MG capsule Take 2 capsules (1,000 mg total) by mouth 2 (two) times daily., Starting 02/21/2013, Until Discontinued, Print    doxycycline (VIBRAMYCIN) 100 MG capsule Take 1 capsule (100 mg total) by mouth 2 (two) times daily., Starting 02/21/2013, Until Discontinued, Print    oxyCODONE-acetaminophen (PERCOCET) 5-325 MG per tablet Take 1 tablet by mouth every 6 (six) hours as needed for pain., Starting 02/21/2013, Until Discontinued, Print         Junius Argyle, MD 02/22/13 1020

## 2013-02-21 NOTE — ED Notes (Signed)
CT notified, pt finished with first cup of contrast.

## 2013-02-21 NOTE — ED Notes (Signed)
Case manager, Jody notified per pt request.

## 2013-02-21 NOTE — Progress Notes (Signed)
[facility] ED CM called by RN in POD A  in regards to medication assistance. Pt presented to the ED with abdominal pains x 3 weeks. In room to speak with patient Pt states, she is unemployed and does not have medical insurance or a PCP. Marland Kitchen Pt made aware of MATCH Program and Guidelines, Explained there is a $3.00 co-pay per prescription, patient agrees to enrollment. MATCH letter given with instructions and pharmacies that participate in [facility]. Pt  given information on the [employer] and [employer] along with instruction on scheduling a follow- up appointment. Also  Provided patient with a [employer], which list community resources within the [place] area. Verbal and written information provided [facility] Card application process. Will contact [facility] to reach out for follow-up. Patient verbalizes understanding and agrees with plan. No further CM needs identified.

## 2013-02-22 ENCOUNTER — Observation Stay (HOSPITAL_COMMUNITY)
Admission: EM | Admit: 2013-02-22 | Discharge: 2013-02-23 | Disposition: A | Discharge status: Home / Self Care | Payer: Medicaid Other | Attending: Family Medicine | Admitting: Family Medicine

## 2013-02-22 ENCOUNTER — Encounter (HOSPITAL_COMMUNITY): Payer: Self-pay | Admitting: Emergency Medicine

## 2013-02-22 DIAGNOSIS — R109 Unspecified abdominal pain: Secondary | ICD-10-CM | POA: Insufficient documentation

## 2013-02-22 DIAGNOSIS — N739 Female pelvic inflammatory disease, unspecified: Secondary | ICD-10-CM | POA: Insufficient documentation

## 2013-02-22 DIAGNOSIS — N200 Calculus of kidney: Secondary | ICD-10-CM | POA: Insufficient documentation

## 2013-02-22 DIAGNOSIS — A5619 Other chlamydial genitourinary infection: Secondary | ICD-10-CM | POA: Insufficient documentation

## 2013-02-22 DIAGNOSIS — N730 Acute parametritis and pelvic cellulitis: Principal | ICD-10-CM | POA: Insufficient documentation

## 2013-02-22 LAB — CBC WITH DIFFERENTIAL/PLATELET
Basophils Absolute: 0 10*3/uL (ref 0.0–0.1)
Basophils Relative: 0 % (ref 0–1)
Eosinophils Absolute: 0.1 10*3/uL (ref 0.0–0.7)
Eosinophils Relative: 1 % (ref 0–5)
HCT: 36.3 % (ref 36.0–46.0)
Hemoglobin: 12 g/dL (ref 12.0–15.0)
Lymphocytes Relative: 30 % (ref 12–46)
Lymphs Abs: 1.6 10*3/uL (ref 0.7–4.0)
MCH: 26.5 pg (ref 26.0–34.0)
MCHC: 33.1 g/dL (ref 30.0–36.0)
MCV: 80.1 fL (ref 78.0–100.0)
Monocytes Absolute: 0.5 10*3/uL (ref 0.1–1.0)
Monocytes Relative: 10 % (ref 3–12)
Neutro Abs: 3.1 10*3/uL (ref 1.7–7.7)
Neutrophils Relative %: 59 % (ref 43–77)
Platelets: 196 10*3/uL (ref 150–400)
RBC: 4.53 MIL/uL (ref 3.87–5.11)
RDW: 14.4 % (ref 11.5–15.5)
WBC: 5.3 10*3/uL (ref 4.0–10.5)

## 2013-02-22 LAB — BASIC METABOLIC PANEL
BUN: 12 mg/dL (ref 6–23)
CO2: 26 mEq/L (ref 19–32)
Calcium: 9.3 mg/dL (ref 8.4–10.5)
Chloride: 105 mEq/L (ref 96–112)
Creatinine, Ser: 0.83 mg/dL (ref 0.50–1.10)
GFR calc Af Amer: >90 mL/min (ref >90)
GFR calc non Af Amer: >90 mL/min (ref >90)
Glucose, Bld: 82 mg/dL (ref 70–99)
Potassium: 4 mEq/L (ref 3.5–5.1)
Sodium: 138 mEq/L (ref 135–145)

## 2013-02-22 LAB — URINALYSIS, ROUTINE W REFLEX MICROSCOPIC
Bilirubin Urine: NEGATIVE
Glucose, UA: NEGATIVE mg/dL
Ketones, ur: NEGATIVE mg/dL
Leukocytes, UA: NEGATIVE
Nitrite: NEGATIVE
Protein, ur: NEGATIVE mg/dL
Specific Gravity, Urine: 1.025 (ref 1.005–1.030)
Urobilinogen, UA: 1 mg/dL (ref 0.0–1.0)
pH: 6 (ref 5.0–8.0)

## 2013-02-22 LAB — URINE MICROSCOPIC-ADD ON

## 2013-02-22 LAB — GC/CHLAMYDIA PROBE AMP
CT Probe RNA: POSITIVE — AB
GC Probe RNA: NEGATIVE

## 2013-02-22 MED ORDER — HYDROMORPHONE HCL PF 1 MG/ML IJ SOLN
1.0000 mg | Freq: Once | INTRAMUSCULAR | Status: AC
  Administered 2013-02-22: 1 mg via INTRAVENOUS
  Filled 2013-02-22: qty 1

## 2013-02-22 MED ORDER — TEMAZEPAM 15 MG PO CAPS: 15.0000 mg | ORAL_CAPSULE | Freq: Every evening | ORAL | Status: DC | PRN

## 2013-02-22 MED ORDER — DOXYCYCLINE HYCLATE 100 MG IV SOLR
100.0000 mg | Freq: Two times a day (BID) | INTRAVENOUS | Status: DC
  Administered 2013-02-23: 100 mg via INTRAVENOUS
  Filled 2013-02-22 (×3): qty 100

## 2013-02-22 MED ORDER — ONDANSETRON HCL 4 MG PO TABS: 4.0000 mg | ORAL_TABLET | Freq: Four times a day (QID) | ORAL | Status: DC | PRN

## 2013-02-22 MED ORDER — MORPHINE SULFATE 4 MG/ML IJ SOLN
1.0000 mg | INTRAMUSCULAR | Status: DC | PRN
  Administered 2013-02-23 (×2): 2 mg via INTRAVENOUS
  Filled 2013-02-22 (×2): qty 1

## 2013-02-22 MED ORDER — MENTHOL 3 MG MT LOZG: 1.0000 | LOZENGE | OROMUCOSAL | Status: DC | PRN

## 2013-02-22 MED ORDER — OXYCODONE-ACETAMINOPHEN 5-325 MG PO TABS
1.0000 | ORAL_TABLET | ORAL | Status: DC | PRN
  Administered 2013-02-23 (×3): 2 via ORAL
  Filled 2013-02-22 (×3): qty 2

## 2013-02-22 MED ORDER — DEXTROSE 5 % IV SOLN
2.0000 g | Freq: Once | INTRAVENOUS | Status: AC
  Administered 2013-02-22: 2 g via INTRAVENOUS
  Filled 2013-02-22: qty 2

## 2013-02-22 MED ORDER — DOXYCYCLINE HYCLATE 100 MG IV SOLR
100.0000 mg | Freq: Once | INTRAVENOUS | Status: DC
  Filled 2013-02-22: qty 100

## 2013-02-22 MED ORDER — IBUPROFEN 600 MG PO TABS: 600.0000 mg | ORAL_TABLET | Freq: Four times a day (QID) | ORAL | Status: DC | PRN

## 2013-02-22 MED ORDER — DOXYCYCLINE HYCLATE 100 MG IV SOLR
100.0000 mg | Freq: Once | INTRAVENOUS | Status: AC
  Administered 2013-02-22: 100 mg via INTRAVENOUS
  Filled 2013-02-22: qty 100

## 2013-02-22 MED ORDER — DEXTROSE 5 % IV SOLN
1.0000 g | Freq: Three times a day (TID) | INTRAVENOUS | Status: DC
  Administered 2013-02-23 (×3): 1 g via INTRAVENOUS
  Filled 2013-02-22 (×4): qty 1

## 2013-02-22 MED ORDER — ONDANSETRON HCL 4 MG/2ML IJ SOLN
4.0000 mg | Freq: Four times a day (QID) | INTRAMUSCULAR | Status: DC | PRN
  Administered 2013-02-23 (×2): 4 mg via INTRAVENOUS
  Filled 2013-02-22 (×2): qty 2

## 2013-02-22 MED ORDER — DEXTROSE IN LACTATED RINGERS 5 % IV SOLN
INTRAVENOUS | Status: DC
  Administered 2013-02-22 – 2013-02-23 (×2): via INTRAVENOUS

## 2013-02-22 MED ORDER — SODIUM CHLORIDE 0.9 % IV BOLUS (SEPSIS)
1000.0000 mL | INTRAVENOUS | Status: AC
  Administered 2013-02-22: 1000 mL via INTRAVENOUS

## 2013-02-22 MED ORDER — ONDANSETRON HCL 4 MG/2ML IJ SOLN
4.0000 mg | Freq: Once | INTRAMUSCULAR | Status: AC
  Administered 2013-02-22: 4 mg via INTRAVENOUS
  Filled 2013-02-22: qty 2

## 2013-02-22 MED ORDER — GUAIFENESIN 100 MG/5ML PO SOLN
15.0000 mL | ORAL | Status: DC | PRN
  Administered 2013-02-23 (×2): 300 mg via ORAL
  Filled 2013-02-22 (×2): qty 15

## 2013-02-22 MED ORDER — HYDROMORPHONE HCL PF 1 MG/ML IJ SOLN
1.0000 mg | INTRAMUSCULAR | Status: AC
  Administered 2013-02-22: 1 mg via INTRAVENOUS
  Filled 2013-02-22: qty 1

## 2013-02-22 MED ORDER — ALUM & MAG HYDROXIDE-SIMETH 200-200-20 MG/5ML PO SUSP: 30.0000 mL | ORAL | Status: DC | PRN

## 2013-02-22 NOTE — ED Provider Notes (Signed)
CSN: [id number]     Arrival date & time 02/22/13  1834 History     First MD Initiated Contact with Patient 02/22/13 1836     Chief Complaint  Patient presents with  . Flank Pain   (Consider location/radiation/quality/duration/timing/severity/associated sxs/prior Treatment) Patient is a 22 y.o. female presenting with flank pain. The history is provided by the patient.  Flank Pain This is a new problem. Episode onset: 3 weeks ago. The problem occurs constantly. The problem has been gradually worsening. Pertinent negatives include no chest pain, no abdominal pain, no headaches and no shortness of breath. Exacerbated by: movement. Nothing relieves the symptoms. Treatments tried: percocet. The treatment provided mild relief.    Past Medical History  Diagnosis Date  . Anemia   . Depression   . Complication of anesthesia     ??, seizure with wisdom teeth  . Urinary tract infection   . Psoriasis   . PID (acute pelvic inflammatory disease)   . Chlamydia   . PONV (postoperative nausea and vomiting)   . Seizures     July 2013 - [place]  . Headache(784.0)     otc med prn  . Anxiety    Past Surgical History  Procedure Laterality Date  . Cesarean section    . Skin graft      off abd, onto arm  . Wisdom tooth extraction    . Cesarean section  06/06/2012    Procedure: CESAREAN SECTION;  Surgeon: Adam Phenix, MD;  Location: [facility];  Service: Obstetrics;  Laterality: N/A;   Family History  Problem Relation Age of Onset  . Hypertension Mother   . Diabetes Mother   . Cancer Mother     BREAST  . Stroke Mother   . Seizures Mother   . Asthma Daughter   . Hypertension Maternal Grandmother   . Diabetes Maternal Grandmother   . Cancer Maternal Grandmother     BONE CANCER  . Other Neg Hx    History  Substance Use Topics  . Smoking status: Never Smoker   . Smokeless tobacco: Never Used  . Alcohol Use: Yes     Comment: socially but none with pregnancy   OB History   Grav  Para Term Preterm Abortions TAB SAB Ect Mult Living   2 2 2  0 0 0 0 0 0 2     Review of Systems  Constitutional: Negative for fever and fatigue.  HENT: Negative for congestion, drooling and neck pain.   Eyes: Negative for pain.  Respiratory: Negative for cough and shortness of breath.   Cardiovascular: Negative for chest pain.  Gastrointestinal: Negative for nausea, vomiting, abdominal pain and diarrhea.  Genitourinary: Positive for dysuria and flank pain. Negative for hematuria.  Musculoskeletal: Negative for back pain and gait problem.  Skin: Negative for color change.  Neurological: Negative for dizziness and headaches.  Hematological: Negative for adenopathy.  Psychiatric/Behavioral: Negative for behavioral problems.  All other systems reviewed and are negative.    Allergies  Review of patient's allergies indicates no known allergies.  Home Medications   Current Outpatient Rx  Name  Route  Sig  Dispense  Refill  . cephALEXin (KEFLEX) 500 MG capsule   Oral   Take 2 capsules (1,000 mg total) by mouth 2 (two) times daily.   40 capsule   0   . doxycycline (VIBRAMYCIN) 100 MG capsule   Oral   Take 1 capsule (100 mg total) by mouth 2 (two) times daily.   28  capsule   0   . ibuprofen (ADVIL,MOTRIN) 200 MG tablet   Oral   Take 200 mg by mouth every 6 (six) hours as needed for pain.         Marland Kitchen oxyCODONE-acetaminophen (PERCOCET) 5-325 MG per tablet   Oral   Take 1 tablet by mouth every 6 (six) hours as needed for pain.   15 tablet   0    BP 121/68  Pulse 95  Temp(Src) 98 F (36.7 C) (Oral)  Resp 20  SpO2 99%  LMP 02/02/2013 Physical Exam  Nursing note and vitals reviewed. Constitutional: She is oriented to person, place, and time. She appears well-developed and well-nourished.  HENT:  Head: Normocephalic.  Mouth/Throat: No oropharyngeal exudate.  Eyes: Conjunctivae and EOM are normal. Pupils are equal, round, and reactive to light.  Neck: Normal range of  motion. Neck supple.  Cardiovascular: Normal rate, regular rhythm, normal heart sounds and intact distal pulses.  Exam reveals no gallop and no friction rub.   No murmur heard. Pulmonary/Chest: Effort normal and breath sounds normal. No respiratory distress. She has no wheezes.  Abdominal: Soft. Bowel sounds are normal. There is no tenderness. There is no rebound and no guarding.  Musculoskeletal: Normal range of motion. She exhibits tenderness (moderate ttp of left flank). She exhibits no edema.  Neurological: She is alert and oriented to person, place, and time.  Skin: Skin is warm and dry.  Psychiatric: She has a normal mood and affect. Her behavior is normal.    ED Course   Procedures (including critical care time)  Labs Reviewed  URINALYSIS, ROUTINE W REFLEX MICROSCOPIC - Abnormal; Notable for the following:    Hgb urine dipstick LARGE (*)    All other components within normal limits  URINE CULTURE  CBC WITH DIFFERENTIAL  BASIC METABOLIC PANEL  URINE MICROSCOPIC-ADD ON  POCT PREGNANCY, URINE   Ct Abdomen Pelvis W Contrast  02/21/2013   *RADIOLOGY REPORT*  Clinical Data: Right flank pain with hematuria for 1 day.  Vaginal discharge and constipation.  CT ABDOMEN AND PELVIS WITH CONTRAST  Technique:  Multidetector CT imaging of the abdomen and pelvis was performed following the standard protocol during bolus administration of intravenous contrast.  Contrast: OMNIPAQUE IOHEXOL 300 MG/ML  SOLN  Comparison: Abdominal pelvic CT 06/17/2012.  Findings: Minimal dependent atelectasis at both lung bases.  No significant pleural or pericardial effusion.  There is a 2 mm nonobstructing calculus in the upper pole of the right kidney on image number 26.  No other renal calculi demonstrated.  There is no hydronephrosis.  There is no evidence of renal or bladder calculus.  No focal renal abnormalities are seen.  The liver, gallbladder, pancreas, spleen and adrenal glands appear normal.  The  stomach, small bowel and colon appear normal.  The appendix appears normal, best seen on the coronal images. The uterus, ovaries and bladder appear normal.  There are no worrisome osseous findings.  IMPRESSION:  1.  Nonobstructing right renal calculus.  No evidence of ureteral calculus or hydronephrosis. 2.  No inflammatory changes demonstrated.   Original Report Authenticated By: Carey Bullocks, M.D.   1. PID (acute pelvic inflammatory disease)     MDM  7:01 PM 22 y.o. female w hx of UTI, PID pw bilateral flank pain x 3 weeks. Seen by me yesterday. Neg CT abd/pelvis. Gave empiric tx for STD's (azithro, rocephin) and sent home w/ tx for PID and UTI. Pt notes worsening pain and vomiting. Unable to  tolerate po abx. Will get labs, IVF, pain control. Chl from yesterday is positive. Will give IV abx for PID here, doxy and cefox.   [facility]   Junius Argyle, MD 02/23/13 1215

## 2013-02-22 NOTE — ED Notes (Signed)
Report given to [facility]. 

## 2013-02-22 NOTE — ED Notes (Signed)
Bed: WA17 Expected date:  Expected time:  Means of arrival:  Comments: 22yo-flank pain

## 2013-02-22 NOTE — H&P (Addendum)
Chloe Rodgers is an 22 y.o. female. She is transferred here for inpatient management of presumed PID, based on low abdominal pain. + chlamydia, N/V.  Pt. Reports onset of low abdominal pain 3-4 wks ago.  LMP was 7/25.  Having vaginal bleeding and N/V which brought her ED last pm.  Sent home on po abx, but because of persistent N/V and inability to keep anything down she returns today.  Had CT scanning on yesterday which showed kidney stone.  Denies dysuria, frequency.  OB History   Grav Para Term Preterm Abortions TAB SAB Ect Mult Living   2 2 2  0 0 0 0 0 0 2         Patient's last menstrual period was 02/02/2013.    Past Medical History  Diagnosis Date  . Anemia   . Depression   . Complication of anesthesia     ??, seizure with wisdom teeth  . Urinary tract infection   . Psoriasis   . PID (acute pelvic inflammatory disease)   . Chlamydia   . PONV (postoperative nausea and vomiting)   . Seizures     July 2013 - [place]  . Headache(784.0)     otc med prn  . Anxiety     Past Surgical History  Procedure Laterality Date  . Cesarean section    . Skin graft      off abd, onto arm  . Wisdom tooth extraction    . Cesarean section  06/06/2012    Procedure: CESAREAN SECTION;  Surgeon: Adam Phenix, MD;  Location: [facility];  Service: Obstetrics;  Laterality: N/A;    Family History  Problem Relation Age of Onset  . Hypertension Mother   . Diabetes Mother   . Cancer Mother     BREAST  . Stroke Mother   . Seizures Mother   . Asthma Daughter   . Hypertension Maternal Grandmother   . Diabetes Maternal Grandmother   . Cancer Maternal Grandmother     BONE CANCER  . Other Neg Hx     Social History:  reports that she has never smoked. She has never used smokeless tobacco. She reports that  drinks alcohol. She reports that she uses illicit drugs (Marijuana).  Allergies: No Known Allergies  Prescriptions prior to admission  Medication Sig Dispense Refill  . cephALEXin  (KEFLEX) 500 MG capsule Take 2 capsules (1,000 mg total) by mouth 2 (two) times daily.  40 capsule  0  . doxycycline (VIBRAMYCIN) 100 MG capsule Take 1 capsule (100 mg total) by mouth 2 (two) times daily.  28 capsule  0  . ibuprofen (ADVIL,MOTRIN) 200 MG tablet Take 200 mg by mouth every 6 (six) hours as needed for pain.      Marland Kitchen oxyCODONE-acetaminophen (PERCOCET) 5-325 MG per tablet Take 1 tablet by mouth every 6 (six) hours as needed for pain.  15 tablet  0    Review of Systems  Constitutional: Negative for fever and chills.  Respiratory: Negative for shortness of breath.   Cardiovascular: Negative for chest pain.  Gastrointestinal: Positive for nausea, vomiting and abdominal pain. Negative for diarrhea, constipation and blood in stool.  Genitourinary: Positive for hematuria and flank pain. Negative for dysuria, urgency and frequency.  Skin: Negative for rash.  Neurological: Negative for focal weakness, seizures and headaches.    Blood pressure 128/77, pulse 77, temperature 98.2 F (36.8 C), temperature source Oral, resp. rate 20, last menstrual period 02/02/2013, SpO2 100.00%. Physical Exam  Vitals reviewed. Constitutional:  She is oriented to person, place, and time. She appears well-developed and well-nourished. No distress.  HENT:  Head: Normocephalic and atraumatic.  Eyes: No scleral icterus.  Neck: Neck supple.  Cardiovascular: Normal rate and regular rhythm.   Respiratory: Effort normal.  GI: Soft. There is no tenderness.  Musculoskeletal: Normal range of motion.  Neurological: She is alert and oriented to person, place, and time.  Skin: Skin is warm and dry. No rash noted.  Psychiatric: She has a normal mood and affect.  GU per ED physician on 8/13--revealed + CMT + CVA tenderness on left.  Results for orders placed during the hospital encounter of 02/22/13 (from the past 24 hour(s))  CBC WITH DIFFERENTIAL     Status: None   Collection Time    02/22/13  7:30 PM       Result Value Range   WBC 5.3  4.0 - 10.5 K/uL   RBC 4.53  3.87 - 5.11 MIL/uL   Hemoglobin 12.0  12.0 - 15.0 g/dL   HCT 16.1  09.6 - 04.5 %   MCV 80.1  78.0 - 100.0 fL   MCH 26.5  26.0 - 34.0 pg   MCHC 33.1  30.0 - 36.0 g/dL   RDW 40.9  81.1 - 91.4 %   Platelets 196  150 - 400 K/uL   Neutrophils Relative % 59  43 - 77 %   Neutro Abs 3.1  1.7 - 7.7 K/uL   Lymphocytes Relative 30  12 - 46 %   Lymphs Abs 1.6  0.7 - 4.0 K/uL   Monocytes Relative 10  3 - 12 %   Monocytes Absolute 0.5  0.1 - 1.0 K/uL   Eosinophils Relative 1  0 - 5 %   Eosinophils Absolute 0.1  0.0 - 0.7 K/uL   Basophils Relative 0  0 - 1 %   Basophils Absolute 0.0  0.0 - 0.1 K/uL  BASIC METABOLIC PANEL     Status: None   Collection Time    02/22/13  7:30 PM      Result Value Range   Sodium 138  135 - 145 mEq/L   Potassium 4.0  3.5 - 5.1 mEq/L   Chloride 105  96 - 112 mEq/L   CO2 26  19 - 32 mEq/L   Glucose, Bld 82  70 - 99 mg/dL   BUN 12  6 - 23 mg/dL   Creatinine, Ser 7.82  0.50 - 1.10 mg/dL   Calcium 9.3  8.4 - 95.6 mg/dL   GFR calc non Af Amer >90  >90 mL/min   GFR calc Af Amer >90  >90 mL/min  URINALYSIS, ROUTINE W REFLEX MICROSCOPIC     Status: Abnormal   Collection Time    02/22/13  8:22 PM      Result Value Range   Color, Urine YELLOW  YELLOW   APPearance CLEAR  CLEAR   Specific Gravity, Urine 1.025  1.005 - 1.030   pH 6.0  5.0 - 8.0   Glucose, UA NEGATIVE  NEGATIVE mg/dL   Hgb urine dipstick LARGE (*) NEGATIVE   Bilirubin Urine NEGATIVE  NEGATIVE   Ketones, ur NEGATIVE  NEGATIVE mg/dL   Protein, ur NEGATIVE  NEGATIVE mg/dL   Urobilinogen, UA 1.0  0.0 - 1.0 mg/dL   Nitrite NEGATIVE  NEGATIVE   Leukocytes, UA NEGATIVE  NEGATIVE  URINE MICROSCOPIC-ADD ON     Status: None   Collection Time    02/22/13  8:22 PM  Result Value Range   Squamous Epithelial / LPF RARE  RARE   RBC / HPF TOO NUMEROUS TO COUNT  <3 RBC/hpf   Bacteria, UA RARE  RARE   Chlamydia positive  Ct Abdomen Pelvis W  Contrast  02/21/2013   *RADIOLOGY REPORT*  Clinical Data: Right flank pain with hematuria for 1 day.  Vaginal discharge and constipation.  CT ABDOMEN AND PELVIS WITH CONTRAST  Technique:  Multidetector CT imaging of the abdomen and pelvis was performed following the standard protocol during bolus administration of intravenous contrast.  Contrast: OMNIPAQUE IOHEXOL 300 MG/ML  SOLN  Comparison: Abdominal pelvic CT 06/17/2012.  Findings: Minimal dependent atelectasis at both lung bases.  No significant pleural or pericardial effusion.  There is a 2 mm nonobstructing calculus in the upper pole of the right kidney on image number 26.  No other renal calculi demonstrated.  There is no hydronephrosis.  There is no evidence of renal or bladder calculus.  No focal renal abnormalities are seen.  The liver, gallbladder, pancreas, spleen and adrenal glands appear normal.  The stomach, small bowel and colon appear normal.  The appendix appears normal, best seen on the coronal images. The uterus, ovaries and bladder appear normal.  There are no worrisome osseous findings.  IMPRESSION:  1.  Nonobstructing right renal calculus.  No evidence of ureteral calculus or hydronephrosis. 2.  No inflammatory changes demonstrated.   Original Report Authenticated By: Carey Bullocks, M.D.    Assessment/Plan: Probable PID, nephrolithiasis.  Failed outpt treatment secondary to N/V. Admit for IV hydration, IV antibiotics, pain control.  La Shehan S 02/22/2013, 11:20 PM

## 2013-02-22 NOTE — ED Notes (Signed)
Per EMS: Was seen at [facility] yesterday. pt c/o of left lower flank pain. pt has PID

## 2013-02-23 DIAGNOSIS — N739 Female pelvic inflammatory disease, unspecified: Secondary | ICD-10-CM

## 2013-02-23 DIAGNOSIS — N200 Calculus of kidney: Secondary | ICD-10-CM

## 2013-02-23 DIAGNOSIS — N730 Acute parametritis and pelvic cellulitis: Secondary | ICD-10-CM

## 2013-02-23 DIAGNOSIS — A5619 Other chlamydial genitourinary infection: Secondary | ICD-10-CM

## 2013-02-23 LAB — URINE CULTURE
Colony Count: NO GROWTH
Culture: NO GROWTH

## 2013-02-23 LAB — POCT PREGNANCY, URINE: Preg Test, Ur: NEGATIVE

## 2013-02-23 MED ORDER — PROMETHAZINE HCL 25 MG PO TABS: 25.0000 mg | ORAL_TABLET | Freq: Four times a day (QID) | ORAL | Status: DC | PRN

## 2013-02-23 MED ORDER — OXYCODONE-ACETAMINOPHEN 5-325 MG PO TABS: 1.0000 | ORAL_TABLET | Freq: Four times a day (QID) | ORAL | Status: DC | PRN

## 2013-02-23 MED ORDER — CEPHALEXIN 500 MG PO CAPS: 1000.0000 mg | ORAL_CAPSULE | Freq: Two times a day (BID) | ORAL | Status: DC

## 2013-02-23 MED ORDER — IBUPROFEN 600 MG PO TABS: 600.0000 mg | ORAL_TABLET | Freq: Four times a day (QID) | ORAL | Status: DC | PRN

## 2013-02-23 MED ORDER — DOXYCYCLINE HYCLATE 100 MG PO CAPS: 100.0000 mg | ORAL_CAPSULE | Freq: Two times a day (BID) | ORAL | Status: DC

## 2013-02-23 NOTE — ED Notes (Signed)
+   chlamydia Patient treated with Doxycyline-[facility]

## 2013-02-23 NOTE — Care Management Note (Unsigned)
    Page 1 of 1   02/23/2013     3:35:19 PM   CARE MANAGEMENT NOTE 02/23/2013  Patient:  Chloe Rodgers, Chloe Rodgers   Account Number:  [id number]  Date Initiated:  02/23/2013  Documentation initiated by:  CRAFT,TERRI  Subjective/Objective Assessment:   22 year old female admitted 02/22/13 with flank pain.     Action/Plan:   D/C when medically stable   Anticipated DC Date:  02/23/2013   Anticipated DC Plan:  HOME/SELF CARE  In-house referral  Financial Counselor      DC Planning Services  CM consult                Status of service:  Completed, signed off     Discharge Disposition:  HOME/SELF CARE   Comments:  02/23/13, Chloe Rodgers RNC-MNN, BSN, CM received referral from pt's RN concerning pt's inability to afford prescriptions with orange card.  CM met with pt and discussed prescriptions.  [facility] will go to [facility] to get one of the antibiotics as it is on the free medication list.  Pt will get other antibiotic with her card.  Pt can use her card to get other medications if she chooses.  Pt does not qualify for [facility] program, this was discussed with AD.  Pt's Medicaid is pending and financial counselor is checking on this for her.

## 2013-02-23 NOTE — Discharge Summary (Signed)
Physician Discharge Summary  Patient ID: Chloe Rodgers MRN: [id number] DOB/AGE: [birth year] 22 y.o.  Admit date: 02/22/2013 Discharge date: 02/23/2013  Admission Diagnoses: PID, chlamydia  Discharge Diagnoses:  Principal Problem:   PID (acute pelvic inflammatory disease) Active Problems:   Nephrolithiasis   Discharged Condition: good  Hospital Course: Pt ws admitted overnight for PID.  She failed outpatient management due to inability to tolerate po meds.  After further questioning of patient, she never got her meds due to lack of funds and now she is unaware of where the prescriptions are.  She has been tolerating a po diet her.  She reposrts emesis x 1 but, the nurses saw no evidence of this.  She reports feeling hungry and is not currently nauseated.  She does c/o URI sx improved with prn decongestants and cough syrup. Consults: None  Significant Diagnostic Studies: labs: CBC  Treatments: IV hydration, antibiotics: Doxycycline and analgesia: acetaminophen w/ codeine  Discharge Exam: Blood pressure 115/85, pulse 78, temperature 98.5 F (36.9 C), temperature source Oral, resp. rate 16, height 5\' 6"  (1.676 m), weight 213 lb 12 oz (96.956 kg), last menstrual period 02/02/2013, SpO2 100.00%. General appearance: alert and no distress Resp: clear to auscultation bilaterally Cardio: regular rate and rhythm, S1, S2 normal, no murmur, click, rub or gallop GI: normal findings: bowel sounds normal and soft  and abnormal findings:  slightly tender diffusely.  No rebound or guarding  CBC    Component Value Date/Time   WBC 5.3 02/22/2013 1930   RBC 4.53 02/22/2013 1930   HGB 12.0 02/22/2013 1930   HGB 11.7 12/22/2011   HCT 36.3 02/22/2013 1930   HCT 34 12/22/2011   PLT 196 02/22/2013 1930   MCV 80.1 02/22/2013 1930   MCH 26.5 02/22/2013 1930   MCHC 33.1 02/22/2013 1930   RDW 14.4 02/22/2013 1930   LYMPHSABS 1.6 02/22/2013 1930   MONOABS 0.5 02/22/2013 1930   EOSABS 0.1 02/22/2013 1930   BASOSABS 0.0 02/22/2013 1930      Disposition: 01-Home or Self Care  Discharge Orders   Future Orders Complete By Expires   Call MD for:  persistant nausea and vomiting  As directed    Call MD for:  severe uncontrolled pain  As directed    Call MD for:  temperature >100.4  As directed    Diet general  As directed    Increase activity slowly  As directed    Sexual Activity Restrictions  As directed    Comments:     Use condoms with all inercourse       Medication List         cephALEXin 500 MG capsule  Commonly known as:  KEFLEX  Take 2 capsules (1,000 mg total) by mouth 2 (two) times daily.     doxycycline 100 MG capsule  Commonly known as:  VIBRAMYCIN  Take 1 capsule (100 mg total) by mouth 2 (two) times daily.     ibuprofen 600 MG tablet  Commonly known as:  ADVIL,MOTRIN  Take 1 tablet (600 mg total) by mouth every 6 (six) hours as needed (mild pain).     oxyCODONE-acetaminophen 5-325 MG per tablet  Commonly known as:  PERCOCET  Take 1 tablet by mouth every 6 (six) hours as needed for pain.     promethazine 25 MG tablet  Commonly known as:  PHENERGAN  Take 1 tablet (25 mg total) by mouth every 6 (six) hours as needed for nausea.  Follow-up Information   Follow up with [facility] In 4 weeks.   Specialty:  Obstetrics and Gynecology   Contact information:   [address] [place] [address] [phone]    A/ PID- did not take po meds as outpt due to lack of funds   Will d/c pt to home after pm does of IV atbx. Social work consulted to assist pt with medication procurement  Signed: Willodean Rosenthal 02/23/2013, 12:36 PM

## 2013-02-23 NOTE — Progress Notes (Signed)
Pt is discharged in the caRE OF [facility].  dOWNSTAIRS PER AMBULATORY sTABLE.        dENIES ANY PAIN OR DISCOMFORT. DISCOMFT.

## 2013-02-24 ENCOUNTER — Telehealth (HOSPITAL_COMMUNITY): Payer: Self-pay | Admitting: Emergency Medicine

## 2013-02-27 ENCOUNTER — Other Ambulatory Visit: Payer: Medicaid Other

## 2013-02-27 NOTE — ED Notes (Signed)
Patient informed of positive results after id'd x 2 and informed of need to notify partner to be treated. 

## 2013-02-28 ENCOUNTER — Ambulatory Visit (INDEPENDENT_AMBULATORY_CARE_PROVIDER_SITE_OTHER): Payer: Medicaid Other | Admitting: *Deleted

## 2013-02-28 VITALS — BP 116/79 | HR 84 | Temp 97.0°F

## 2013-02-28 DIAGNOSIS — A749 Chlamydial infection, unspecified: Secondary | ICD-10-CM

## 2013-02-28 MED ORDER — AZITHROMYCIN 1 G PO PACK
1.0000 g | PACK | Freq: Once | ORAL | Status: AC
  Administered 2013-02-28: 1 g via ORAL

## 2013-02-28 MED ORDER — AZITHROMYCIN 1 G PO PACK: 1.0000 | PACK | Freq: Once | ORAL | Status: DC

## 2013-02-28 NOTE — Progress Notes (Signed)
Patient states she is having trouble keeping her meds down, is taking phenergan before takes doxycycline and keflex po and is having N&V.  Per Ralene Bathe, LPN patient called yesterday and she consulted Dr. Burnice Logan- [facility] and was told to have patient come in to get Zithromax 1 gm po powder . Patient has no insurance at present and can't afford multiple meds.

## 2013-02-28 NOTE — Progress Notes (Signed)
Late entry-  02/27/13 @ 1345  Pt called and stated that she was taking antibiotics that she would constantly throw back up.  Looking pts medications- I asked pt if she had taken the phenergan pt stated "yes, I would take the phenergan before I would take the antibiotics and I still throw up.  I even give it time for the medicine to work and I still can't keep it down.  I called Dr. Erin Fulling, in house, and informed her the pts complaints and that the pt has not c/o pain.  Per Dr. Erin Fulling pt can come in the office and receive azithromycin powder 1 gram one time instead and see if that is effective since pt is not complaining of pain.  Informed pt what Dr., Erin Fulling requested and pt stated understanding and that she would be coming into the office.

## 2013-03-14 ENCOUNTER — Emergency Department (HOSPITAL_COMMUNITY): Payer: Medicaid Other

## 2013-03-14 ENCOUNTER — Encounter (HOSPITAL_COMMUNITY): Payer: Self-pay

## 2013-03-14 ENCOUNTER — Emergency Department (HOSPITAL_COMMUNITY)
Admission: EM | Admit: 2013-03-14 | Discharge: 2013-03-14 | Disposition: A | Discharge status: Home / Self Care | Payer: Medicaid Other | Attending: Emergency Medicine | Admitting: Emergency Medicine

## 2013-03-14 DIAGNOSIS — Z79899 Other long term (current) drug therapy: Secondary | ICD-10-CM | POA: Insufficient documentation

## 2013-03-14 DIAGNOSIS — Z3202 Encounter for pregnancy test, result negative: Secondary | ICD-10-CM | POA: Insufficient documentation

## 2013-03-14 DIAGNOSIS — Z87440 Personal history of urinary (tract) infections: Secondary | ICD-10-CM | POA: Insufficient documentation

## 2013-03-14 DIAGNOSIS — Z862 Personal history of diseases of the blood and blood-forming organs and certain disorders involving the immune mechanism: Secondary | ICD-10-CM | POA: Insufficient documentation

## 2013-03-14 DIAGNOSIS — A499 Bacterial infection, unspecified: Secondary | ICD-10-CM | POA: Insufficient documentation

## 2013-03-14 DIAGNOSIS — Z8619 Personal history of other infectious and parasitic diseases: Secondary | ICD-10-CM | POA: Insufficient documentation

## 2013-03-14 DIAGNOSIS — N83209 Unspecified ovarian cyst, unspecified side: Secondary | ICD-10-CM

## 2013-03-14 DIAGNOSIS — N760 Acute vaginitis: Secondary | ICD-10-CM | POA: Insufficient documentation

## 2013-03-14 DIAGNOSIS — N739 Female pelvic inflammatory disease, unspecified: Secondary | ICD-10-CM | POA: Insufficient documentation

## 2013-03-14 DIAGNOSIS — N898 Other specified noninflammatory disorders of vagina: Secondary | ICD-10-CM | POA: Insufficient documentation

## 2013-03-14 DIAGNOSIS — B9689 Other specified bacterial agents as the cause of diseases classified elsewhere: Secondary | ICD-10-CM | POA: Insufficient documentation

## 2013-03-14 DIAGNOSIS — R509 Fever, unspecified: Secondary | ICD-10-CM | POA: Insufficient documentation

## 2013-03-14 DIAGNOSIS — Z872 Personal history of diseases of the skin and subcutaneous tissue: Secondary | ICD-10-CM | POA: Insufficient documentation

## 2013-03-14 DIAGNOSIS — R109 Unspecified abdominal pain: Secondary | ICD-10-CM

## 2013-03-14 DIAGNOSIS — R112 Nausea with vomiting, unspecified: Secondary | ICD-10-CM | POA: Insufficient documentation

## 2013-03-14 DIAGNOSIS — Z8659 Personal history of other mental and behavioral disorders: Secondary | ICD-10-CM | POA: Insufficient documentation

## 2013-03-14 DIAGNOSIS — Z8669 Personal history of other diseases of the nervous system and sense organs: Secondary | ICD-10-CM | POA: Insufficient documentation

## 2013-03-14 LAB — CBC
HCT: 36.9 % (ref 36.0–46.0)
Hemoglobin: 12.1 g/dL (ref 12.0–15.0)
MCH: 26.2 pg (ref 26.0–34.0)
MCHC: 32.8 g/dL (ref 30.0–36.0)
MCV: 80 fL (ref 78.0–100.0)
Platelets: 205 10*3/uL (ref 150–400)
RBC: 4.61 MIL/uL (ref 3.87–5.11)
RDW: 14.5 % (ref 11.5–15.5)
WBC: 7.1 10*3/uL (ref 4.0–10.5)

## 2013-03-14 LAB — COMPREHENSIVE METABOLIC PANEL
ALT: 12 U/L (ref 0–35)
AST: 19 U/L (ref 0–37)
Albumin: 3.5 g/dL (ref 3.5–5.2)
Alkaline Phosphatase: 70 U/L (ref 39–117)
BUN: 10 mg/dL (ref 6–23)
CO2: 25 mEq/L (ref 19–32)
Calcium: 9.1 mg/dL (ref 8.4–10.5)
Chloride: 105 mEq/L (ref 96–112)
Creatinine, Ser: 0.84 mg/dL (ref 0.50–1.10)
GFR calc Af Amer: >90 mL/min (ref >90)
GFR calc non Af Amer: >90 mL/min (ref >90)
Glucose, Bld: 83 mg/dL (ref 70–99)
Potassium: 3.9 mEq/L (ref 3.5–5.1)
Sodium: 138 mEq/L (ref 135–145)
Total Bilirubin: 0.4 mg/dL (ref 0.3–1.2)
Total Protein: 7.7 g/dL (ref 6.0–8.3)

## 2013-03-14 LAB — URINALYSIS, ROUTINE W REFLEX MICROSCOPIC
Bilirubin Urine: NEGATIVE
Glucose, UA: NEGATIVE mg/dL
Hgb urine dipstick: NEGATIVE
Ketones, ur: NEGATIVE mg/dL
Nitrite: NEGATIVE
Protein, ur: NEGATIVE mg/dL
Specific Gravity, Urine: 1.03 (ref 1.005–1.030)
Urobilinogen, UA: 1 mg/dL (ref 0.0–1.0)
pH: 6.5 (ref 5.0–8.0)

## 2013-03-14 LAB — POCT PREGNANCY, URINE: Preg Test, Ur: NEGATIVE

## 2013-03-14 LAB — WET PREP, GENITAL
Trich, Wet Prep: NONE SEEN
Yeast Wet Prep HPF POC: NONE SEEN

## 2013-03-14 LAB — URINE MICROSCOPIC-ADD ON

## 2013-03-14 MED ORDER — KETOROLAC TROMETHAMINE 30 MG/ML IJ SOLN
30.0000 mg | Freq: Once | INTRAMUSCULAR | Status: DC
  Filled 2013-03-14: qty 1

## 2013-03-14 MED ORDER — KETOROLAC TROMETHAMINE 60 MG/2ML IM SOLN
60.0000 mg | Freq: Once | INTRAMUSCULAR | Status: AC
  Administered 2013-03-14: 60 mg via INTRAMUSCULAR
  Filled 2013-03-14: qty 2

## 2013-03-14 MED ORDER — ONDANSETRON HCL 4 MG/2ML IJ SOLN
4.0000 mg | Freq: Once | INTRAMUSCULAR | Status: DC
  Filled 2013-03-14: qty 2

## 2013-03-14 MED ORDER — CEPHALEXIN 500 MG PO CAPS: 500.0000 mg | ORAL_CAPSULE | Freq: Four times a day (QID) | ORAL | Status: DC

## 2013-03-14 MED ORDER — SODIUM CHLORIDE 0.9 % IV BOLUS (SEPSIS): 1000.0000 mL | Freq: Once | INTRAVENOUS | Status: DC

## 2013-03-14 MED ORDER — DOXYCYCLINE HYCLATE 100 MG PO CAPS: 100.0000 mg | ORAL_CAPSULE | Freq: Two times a day (BID) | ORAL | Status: DC

## 2013-03-14 MED ORDER — ONDANSETRON 4 MG PO TBDP
4.0000 mg | ORAL_TABLET | Freq: Once | ORAL | Status: AC
  Administered 2013-03-14: 4 mg via ORAL
  Filled 2013-03-14: qty 1

## 2013-03-14 NOTE — ED Notes (Signed)
Patient transported to Ultrasound.  Delay on meds

## 2013-03-14 NOTE — ED Notes (Signed)
Bed: WA20 Expected date:  Expected time:  Means of arrival:  Comments: EMS 

## 2013-03-14 NOTE — ED Provider Notes (Signed)
CSN: [id number]     Arrival date & time 03/14/13  1504 History   First MD Initiated Contact with Patient 03/14/13 1510     Chief Complaint  Patient presents with  . Abdominal Pain    x 2 weeks  . Pelvic Inflammatory Disease    x 4 weeks  . Nausea  . Emesis   (Consider location/radiation/quality/duration/timing/severity/associated sxs/prior Treatment) HPI Comments: 22 year old female with a past medical history of PID and chlamydia presents to the emergency department via EMS complaining of continuing left-sided abdominal pain since being discharged from the hospital on August 14 after being admitted for pelvic inflammatory disease. Patient states her abdominal pain has been present for the past 4 weeks, was treated on August 14, discharged with antibiotics and pain control, however has not been able to keep anything down and did not complete the course of antibiotics. States she has not had anything to eat or drink for the past 3 days due to nausea and vomiting. Also states she was diagnosed with a kidney stone on the left 2 weeks back while in the hospital. After looking through the patient's hospital record, she did have a CT scan on August 13 which showed a nonobstructing kidney stone on the right, not the left. No inflammatory changes seen in the abdomen or pelvis. She was given IV Rocephin and azithromycin, discharged with doxycycline and keflex both of which she has not been able to keep down. Pain in abdomen is sharp, throughout her entire abdomen, worse on left, radiating to the left side of her back rated 9/10. States she had a fever of 103.6 earlier today, did not take anything to bring fever down. Admits to associated brown vaginal discharge which has been present since symptoms began.  Patient is a 22 y.o. female presenting with abdominal pain and vomiting. The history is provided by the patient.  Abdominal Pain Associated symptoms: fever, nausea, vaginal discharge and vomiting    Associated symptoms: no chest pain, no chills, no constipation, no diarrhea, no dysuria, no shortness of breath and no vaginal bleeding   Emesis Associated symptoms: abdominal pain   Associated symptoms: no chills, no diarrhea and no headaches     Past Medical History  Diagnosis Date  . Anemia   . Depression   . Complication of anesthesia     ??, seizure with wisdom teeth  . Urinary tract infection   . Psoriasis   . PID (acute pelvic inflammatory disease)   . Chlamydia   . PONV (postoperative nausea and vomiting)   . Seizures     July 2013 - [facility]  . Headache(784.0)     otc med prn  . Anxiety    Past Surgical History  Procedure Laterality Date  . Cesarean section    . Skin graft      off abd, onto arm  . Wisdom tooth extraction    . Cesarean section  06/06/2012    Procedure: CESAREAN SECTION;  Surgeon: Adam Phenix, MD;  Location: [facility];  Service: Obstetrics;  Laterality: N/A;   Family History  Problem Relation Age of Onset  . Hypertension Mother   . Diabetes Mother   . Cancer Mother     BREAST  . Stroke Mother   . Seizures Mother   . Asthma Daughter   . Hypertension Maternal Grandmother   . Diabetes Maternal Grandmother   . Cancer Maternal Grandmother     BONE CANCER  . Other Neg Hx    History  Substance Use Topics  . Smoking status: Never Smoker   . Smokeless tobacco: Never Used  . Alcohol Use: Yes     Comment: socially but none with pregnancy   OB History   Grav Para Term Preterm Abortions TAB SAB Ect Mult Living   2 2 2  0 0 0 0 0 0 2     Review of Systems  Constitutional: Positive for fever. Negative for chills.  HENT: Negative for mouth sores, neck pain and neck stiffness.   Respiratory: Negative for shortness of breath.   Cardiovascular: Negative for chest pain.  Gastrointestinal: Positive for nausea, vomiting and abdominal pain. Negative for diarrhea and constipation.  Genitourinary: Positive for flank pain and vaginal discharge.  Negative for dysuria, urgency, frequency, vaginal bleeding and difficulty urinating.  Musculoskeletal: Positive for back pain.  Skin: Negative for rash.  Neurological: Negative for weakness, light-headedness and headaches.  Hematological: Does not bruise/bleed easily.  Psychiatric/Behavioral: Negative for confusion.  All other systems reviewed and are negative.    Allergies  Review of patient's allergies indicates no known allergies.  Home Medications   Current Outpatient Rx  Name  Route  Sig  Dispense  Refill  . cephALEXin (KEFLEX) 500 MG capsule   Oral   Take 2 capsules (1,000 mg total) by mouth 2 (two) times daily.   40 capsule   0   . doxycycline (VIBRAMYCIN) 100 MG capsule   Oral   Take 1 capsule (100 mg total) by mouth 2 (two) times daily.   28 capsule   0   . ibuprofen (ADVIL,MOTRIN) 600 MG tablet   Oral   Take 1 tablet (600 mg total) by mouth every 6 (six) hours as needed (mild pain).   30 tablet   0   . oxyCODONE-acetaminophen (PERCOCET) 5-325 MG per tablet   Oral   Take 1 tablet by mouth every 6 (six) hours as needed for pain.   20 tablet   0   . promethazine (PHENERGAN) 25 MG tablet   Oral   Take 1 tablet (25 mg total) by mouth every 6 (six) hours as needed for nausea.   12 tablet   0    BP 115/63  Pulse 80  Temp(Src) 97.9 F (36.6 C) (Oral)  Resp 16  SpO2 100%  LMP 02/02/2013 Physical Exam  Nursing note and vitals reviewed. Constitutional: She is oriented to person, place, and time. She appears well-developed and well-nourished. No distress.  Talking on her cell phone in NAD, laying comfortably on exam table.  HENT:  Head: Normocephalic and atraumatic.  Mouth/Throat: Oropharynx is clear and moist.  Eyes: Conjunctivae and EOM are normal. No scleral icterus.  Neck: Normal range of motion. Neck supple.  Cardiovascular: Normal rate, regular rhythm and normal heart sounds.   Pulmonary/Chest: Effort normal and breath sounds normal.  Abdominal:  Soft. Normal appearance and bowel sounds are normal. She exhibits no distension and no mass. There is generalized tenderness. There is CVA tenderness (left). There is no rigidity, no rebound and no guarding.  Generalized tenderness, worse left suprapubic region, no peritoneal signs. Tenderness is to deep palpation only, carries conversation during exam without wincing with distraction.  Genitourinary: Uterus normal. There is no rash or tenderness on the right labia. Cervix exhibits no motion tenderness, no discharge and no friability. Right adnexum displays tenderness. Right adnexum displays no mass and no fullness. Left adnexum displays tenderness. Left adnexum displays no mass and no fullness. There is tenderness around the vagina. No erythema  or bleeding around the vagina. Vaginal discharge (scant, white) found.  Musculoskeletal: Normal range of motion. She exhibits no edema.  Neurological: She is alert and oriented to person, place, and time.  Skin: Skin is warm and dry. She is not diaphoretic.  Psychiatric: She has a normal mood and affect. Her behavior is normal.    ED Course  Procedures (including critical care time) Labs Review Labs Reviewed  WET PREP, GENITAL - Abnormal; Notable for the following:    Clue Cells Wet Prep HPF POC FEW (*)    WBC, Wet Prep HPF POC FEW (*)    All other components within normal limits  URINALYSIS, ROUTINE W REFLEX MICROSCOPIC - Abnormal; Notable for the following:    APPearance CLOUDY (*)    Leukocytes, UA MODERATE (*)    All other components within normal limits  URINE MICROSCOPIC-ADD ON - Abnormal; Notable for the following:    Squamous Epithelial / LPF FEW (*)    Bacteria, UA MANY (*)    All other components within normal limits  GC/CHLAMYDIA PROBE AMP  URINE CULTURE  CBC  COMPREHENSIVE METABOLIC PANEL  POCT PREGNANCY, URINE   Imaging Review US Transvaginal Non-ob  03/14/2013   *RADIOLOGY REPORT*  Clinical Data: Abdominal pain.  Pelvic  inflammatory disease. Nausea and vomiting.  TRANSABDOMINAL AND TRANSVAGINAL ULTRASOUND OF PELVIS Technique:  Both transabdominal and transvaginal ultrasound examinations of the pelvis were performed. Transabdominal technique was performed for global imaging of the pelvis including uterus, ovaries, adnexal regions, and pelvic cul-de-sac.  It was necessary to proceed with endovaginal exam following the transabdominal exam to visualize the uterus, endometrium, ovaries, and adnexa.  Comparison:  CT scan of the abdomen dated 02/21/2013  Findings:  Uterus: Normal.  7.6 x 5.2 x 5.5 cm.  Endometrium: Normal.  7.3 mm in thickness.  Right ovary:  Normal.  2.0 x 1.4 x 2.2 cm.  Left ovary: 2.8 x 2.5 x 2.8 cm.  1.6 cm partially collapsed hemorrhagic cyst in the left ovary.  Other findings: There is small to moderate free fluid in the pelvic cul-de-sac.  IMPRESSION: Partially collapsed hemorrhagic cyst in the left ovary.  Small to moderate free fluid in the pelvic cul-de-sac.   Original Report Authenticated By: Francene Boyers, M.D.   US Pelvis Complete  03/14/2013   *RADIOLOGY REPORT*  Clinical Data: Abdominal pain.  Pelvic inflammatory disease. Nausea and vomiting.  TRANSABDOMINAL AND TRANSVAGINAL ULTRASOUND OF PELVIS Technique:  Both transabdominal and transvaginal ultrasound examinations of the pelvis were performed. Transabdominal technique was performed for global imaging of the pelvis including uterus, ovaries, adnexal regions, and pelvic cul-de-sac.  It was necessary to proceed with endovaginal exam following the transabdominal exam to visualize the uterus, endometrium, ovaries, and adnexa.  Comparison:  CT scan of the abdomen dated 02/21/2013  Findings:  Uterus: Normal.  7.6 x 5.2 x 5.5 cm.  Endometrium: Normal.  7.3 mm in thickness.  Right ovary:  Normal.  2.0 x 1.4 x 2.2 cm.  Left ovary: 2.8 x 2.5 x 2.8 cm.  1.6 cm partially collapsed hemorrhagic cyst in the left ovary.  Other findings: There is small to moderate free  fluid in the pelvic cul-de-sac.  IMPRESSION: Partially collapsed hemorrhagic cyst in the left ovary.  Small to moderate free fluid in the pelvic cul-de-sac.   Original Report Authenticated By: Francene Boyers, M.D.    MDM   1. Ovarian cyst   2. BV (bacterial vaginosis)   3. Abdominal pain  Patient with continuing abdominal pain, vaginal discharge after being treated in hospital for PID on 8/13-8/14. Did not complete PO antibiotics due to n/v. Patient appears comfortable, in NAD, normal vital signs. Abdomen soft, no peritoneal signs, no signs of tenderness with distraction. Chlamydia culture positive, had IV rocephin and azithromycin. Labs pending, cbc, cmp, ua, urine preg. Will obtain pelvic US to r/o TOA. Case discussed with my attending Dr. Freida Busman who agrees with plan of care. 6:48 PM Patient reports slight improvement of symptoms after receiving Toradol. No longer nauseated. She is tolerating PO. Pelvic ultrasound did not show any evidence of tubo-ovarian abscess, results as shown above. I discussed findings with patient will follow up with her OB/GYN. I will give her new prescriptions for antibiotics as prescribed previously as she did not keep anything down. Tylenol/Motrin for pain. Return precautions discussed. Patient states understanding of plan and is agreeable.    Trevor Mace, PA-C 03/14/13 [phone]

## 2013-03-14 NOTE — ED Notes (Signed)
Per [facility]- Pt reports Seen here for presenting complaints of PID x 4 weeks, stomach pains ?kidney stones x 2 weeks.  Seen and treated here at [facility].  Pt has not improved. Did not complete antibiotics due to N/V Here for re-evuation

## 2013-03-14 NOTE — ED Provider Notes (Signed)
Medical screening examination/treatment/procedure(s) were performed by non-physician practitioner and as supervising physician I was immediately available for consultation/collaboration.  Danner Paulding T Keltin Baird, MD 03/14/13 2307 

## 2013-03-14 NOTE — ED Notes (Signed)
MD at bedside. 

## 2013-03-15 LAB — URINE CULTURE: Colony Count: 40000

## 2013-03-15 LAB — GC/CHLAMYDIA PROBE AMP
CT Probe RNA: NEGATIVE
GC Probe RNA: NEGATIVE

## 2013-03-19 ENCOUNTER — Encounter (HOSPITAL_COMMUNITY): Payer: Self-pay | Admitting: *Deleted

## 2013-03-19 ENCOUNTER — Inpatient Hospital Stay (HOSPITAL_COMMUNITY)
Admission: AD | Admit: 2013-03-19 | Discharge: 2013-03-19 | Disposition: A | Discharge status: Home / Self Care | Payer: Medicaid Other | Source: Ambulatory Visit | Attending: Obstetrics & Gynecology | Admitting: Obstetrics & Gynecology

## 2013-03-19 DIAGNOSIS — R102 Pelvic and perineal pain: Secondary | ICD-10-CM

## 2013-03-19 DIAGNOSIS — N949 Unspecified condition associated with female genital organs and menstrual cycle: Secondary | ICD-10-CM | POA: Insufficient documentation

## 2013-03-19 DIAGNOSIS — R1032 Left lower quadrant pain: Secondary | ICD-10-CM | POA: Insufficient documentation

## 2013-03-19 DIAGNOSIS — N938 Other specified abnormal uterine and vaginal bleeding: Secondary | ICD-10-CM | POA: Insufficient documentation

## 2013-03-19 LAB — COMPREHENSIVE METABOLIC PANEL
ALT: 8 U/L (ref 0–35)
AST: 11 U/L (ref 0–37)
Albumin: 3.3 g/dL — ABNORMAL LOW (ref 3.5–5.2)
Alkaline Phosphatase: 69 U/L (ref 39–117)
BUN: 10 mg/dL (ref 6–23)
CO2: 25 mEq/L (ref 19–32)
Calcium: 9 mg/dL (ref 8.4–10.5)
Chloride: 102 mEq/L (ref 96–112)
Creatinine, Ser: 1.1 mg/dL (ref 0.50–1.10)
GFR calc Af Amer: 82 mL/min — ABNORMAL LOW (ref >90)
GFR calc non Af Amer: 71 mL/min — ABNORMAL LOW (ref >90)
Glucose, Bld: 109 mg/dL — ABNORMAL HIGH (ref 70–99)
Potassium: 3.2 mEq/L — ABNORMAL LOW (ref 3.5–5.1)
Sodium: 138 mEq/L (ref 135–145)
Total Bilirubin: 0.3 mg/dL (ref 0.3–1.2)
Total Protein: 7 g/dL (ref 6.0–8.3)

## 2013-03-19 LAB — URINALYSIS, ROUTINE W REFLEX MICROSCOPIC
Bilirubin Urine: NEGATIVE
Glucose, UA: NEGATIVE mg/dL
Ketones, ur: NEGATIVE mg/dL
Nitrite: NEGATIVE
Protein, ur: NEGATIVE mg/dL
Specific Gravity, Urine: 1.015 (ref 1.005–1.030)
Urobilinogen, UA: 0.2 mg/dL (ref 0.0–1.0)
pH: 6 (ref 5.0–8.0)

## 2013-03-19 LAB — CBC WITH DIFFERENTIAL/PLATELET
Basophils Absolute: 0 10*3/uL (ref 0.0–0.1)
Basophils Relative: 0 % (ref 0–1)
Eosinophils Absolute: 0.2 10*3/uL (ref 0.0–0.7)
Eosinophils Relative: 2 % (ref 0–5)
HCT: 33.7 % — ABNORMAL LOW (ref 36.0–46.0)
Hemoglobin: 11.3 g/dL — ABNORMAL LOW (ref 12.0–15.0)
Lymphocytes Relative: 28 % (ref 12–46)
Lymphs Abs: 2.2 10*3/uL (ref 0.7–4.0)
MCH: 26.6 pg (ref 26.0–34.0)
MCHC: 33.5 g/dL (ref 30.0–36.0)
MCV: 79.3 fL (ref 78.0–100.0)
Monocytes Absolute: 0.8 10*3/uL (ref 0.1–1.0)
Monocytes Relative: 10 % (ref 3–12)
Neutro Abs: 4.8 10*3/uL (ref 1.7–7.7)
Neutrophils Relative %: 60 % (ref 43–77)
Platelets: 186 10*3/uL (ref 150–400)
RBC: 4.25 MIL/uL (ref 3.87–5.11)
RDW: 14.7 % (ref 11.5–15.5)
WBC: 8 10*3/uL (ref 4.0–10.5)

## 2013-03-19 LAB — URINE MICROSCOPIC-ADD ON

## 2013-03-19 LAB — POCT PREGNANCY, URINE: Preg Test, Ur: NEGATIVE

## 2013-03-19 MED ORDER — CEFTRIAXONE SODIUM 1 G IJ SOLR
500.0000 mg | Freq: Once | INTRAMUSCULAR | Status: AC
  Administered 2013-03-19: 500 mg via INTRAMUSCULAR
  Filled 2013-03-19: qty 10

## 2013-03-19 MED ORDER — PHENAZOPYRIDINE HCL 95 MG PO TABS: 95.0000 mg | ORAL_TABLET | Freq: Three times a day (TID) | ORAL | Status: DC | PRN

## 2013-03-19 MED ORDER — PROMETHAZINE HCL 25 MG/ML IJ SOLN
25.0000 mg | Freq: Once | INTRAMUSCULAR | Status: AC
  Administered 2013-03-19: 25 mg via INTRAMUSCULAR
  Filled 2013-03-19: qty 1

## 2013-03-19 MED ORDER — PHENAZOPYRIDINE HCL 100 MG PO TABS
200.0000 mg | ORAL_TABLET | Freq: Once | ORAL | Status: AC
  Administered 2013-03-19: 200 mg via ORAL
  Filled 2013-03-19: qty 2

## 2013-03-19 MED ORDER — KETOROLAC TROMETHAMINE 60 MG/2ML IM SOLN
60.0000 mg | Freq: Once | INTRAMUSCULAR | Status: AC
  Administered 2013-03-19: 60 mg via INTRAMUSCULAR
  Filled 2013-03-19: qty 2

## 2013-03-19 NOTE — MAU Provider Note (Signed)
Attestation of Attending Supervision of Advanced Practitioner (CNM/NP): Evaluation and management procedures were performed by the Advanced Practitioner under my supervision and collaboration.  I have reviewed the Advanced Practitioner's note and chart, and I agree with the management and plan.  HARRAWAY-SMITH, Jayda White 7:55 PM     

## 2013-03-19 NOTE — MAU Provider Note (Signed)
History     CSN: [id number]  Arrival date and time: 03/19/13 4696   First Provider Initiated Contact with Patient 03/19/13 0126      Chief Complaint  Patient presents with  . Abdominal Pain  . Vaginal Bleeding   HPI  Chloe Rodgers is a 22 y.o. [id number] who presents today with LLQ pain x 4 weeks that she states got worse today when she started bleeding. She states her LMP 02/24/13 after she was DC home from here with PID. She was treated at that time with IV meds, and when she was sent home she was unable to keep down her PO meds. She was seen in the clinic on 8/20 for a 1g dose of azithromycin. At that time she was advised to stop all PO meds.   She went to [facility] on 9/3 for worsening pain.  GC/CT negative. She was given meds for a UTI and they found a cyst on her ovary. She was given keflex and doxycycline, (patient states that she was given the doxycycline because she wasn't able to keep down to full course of meds for [facility]) but has not been able to keep them down.   She states that the pain is a 10/10 at this time. She states that it never goes away, and she cannot keep anything down. She states that her last BM was on 9/5, and she has had diarrhea. She denies any fever.   Past Medical History  Diagnosis Date  . Anemia   . Depression   . Complication of anesthesia     ??, seizure with wisdom teeth  . Urinary tract infection   . Psoriasis   . PID (acute pelvic inflammatory disease)   . Chlamydia   . PONV (postoperative nausea and vomiting)   . Seizures     July 2013 - [place]  . Headache(784.0)     otc med prn  . Anxiety     Past Surgical History  Procedure Laterality Date  . Cesarean section    . Skin graft      off abd, onto arm  . Wisdom tooth extraction    . Cesarean section  06/06/2012    Procedure: CESAREAN SECTION;  Surgeon: Adam Phenix, MD;  Location: [facility];  Service: Obstetrics;  Laterality: N/A;    Family History  Problem Relation Age of Onset  .  Hypertension Mother   . Diabetes Mother   . Cancer Mother     BREAST  . Stroke Mother   . Seizures Mother   . Asthma Daughter   . Hypertension Maternal Grandmother   . Diabetes Maternal Grandmother   . Cancer Maternal Grandmother     BONE CANCER  . Other Neg Hx     History  Substance Use Topics  . Smoking status: Never Smoker   . Smokeless tobacco: Never Used  . Alcohol Use: Yes     Comment: socially but none with pregnancy    Allergies: No Known Allergies  Prescriptions prior to admission  Medication Sig Dispense Refill  . ibuprofen (ADVIL,MOTRIN) 600 MG tablet Take 1 tablet (600 mg total) by mouth every 6 (six) hours as needed (mild pain).  30 tablet  0  . oxyCODONE-acetaminophen (PERCOCET) 5-325 MG per tablet Take 1 tablet by mouth every 6 (six) hours as needed for pain.  20 tablet  0  . promethazine (PHENERGAN) 25 MG tablet Take 1 tablet (25 mg total) by mouth every 6 (six) hours as needed for nausea.  12 tablet  0  . [DISCONTINUED] cephALEXin (KEFLEX) 500 MG capsule Take 1 capsule (500 mg total) by mouth 4 (four) times daily.  28 capsule  0  . [DISCONTINUED] doxycycline (VIBRAMYCIN) 100 MG capsule Take 1 capsule (100 mg total) by mouth 2 (two) times daily.  28 capsule  0  . [DISCONTINUED] doxycycline (VIBRAMYCIN) 100 MG capsule Take 1 capsule (100 mg total) by mouth 2 (two) times daily. One po bid x 7 days  14 capsule  0  . [DISCONTINUED] cephALEXin (KEFLEX) 500 MG capsule Take 2 capsules (1,000 mg total) by mouth 2 (two) times daily.  40 capsule  0    ROS Physical Exam   Blood pressure 122/72, pulse 75, temperature 97.9 F (36.6 C), temperature source Oral, resp. rate 16, height 5\' 6"  (1.676 m), weight 99.791 kg (220 lb), last menstrual period 02/23/2013.  Physical Exam  Nursing note and vitals reviewed. Constitutional: She is oriented to person, place, and time. She appears well-developed and well-nourished. No distress.  Cardiovascular: Normal rate.   Respiratory:  Effort normal.  GI: Soft. There is no tenderness.  Genitourinary:   External: no lesion Vagina: small amount of blood Cervix: pink, smooth, no CMT Uterus: NSSC Adnexa: NT   Neurological: She is alert and oriented to person, place, and time.  Skin: Skin is warm.  Psychiatric: She has a normal mood and affect.    MAU Course  Procedures  Results for orders placed during the hospital encounter of 03/19/13 (from the past 24 hour(s))  URINALYSIS, ROUTINE W REFLEX MICROSCOPIC     Status: Abnormal   Collection Time    03/19/13 12:45 AM      Result Value Range   Color, Urine ORANGE (*) YELLOW   APPearance HAZY (*) CLEAR   Specific Gravity, Urine 1.015  1.005 - 1.030   pH 6.0  5.0 - 8.0   Glucose, UA NEGATIVE  NEGATIVE mg/dL   Hgb urine dipstick LARGE (*) NEGATIVE   Bilirubin Urine NEGATIVE  NEGATIVE   Ketones, ur NEGATIVE  NEGATIVE mg/dL   Protein, ur NEGATIVE  NEGATIVE mg/dL   Urobilinogen, UA 0.2  0.0 - 1.0 mg/dL   Nitrite NEGATIVE  NEGATIVE   Leukocytes, UA TRACE (*) NEGATIVE  URINE MICROSCOPIC-ADD ON     Status: Abnormal   Collection Time    03/19/13 12:45 AM      Result Value Range   Squamous Epithelial / LPF RARE  RARE   WBC, UA 3-6  <3 WBC/hpf   RBC / HPF TOO NUMEROUS TO COUNT  <3 RBC/hpf   Bacteria, UA FEW (*) RARE  POCT PREGNANCY, URINE     Status: None   Collection Time    03/19/13 12:54 AM      Result Value Range   Preg Test, Ur NEGATIVE  NEGATIVE  CBC WITH DIFFERENTIAL     Status: Abnormal   Collection Time    03/19/13  1:20 AM      Result Value Range   WBC 8.0  4.0 - 10.5 K/uL   RBC 4.25  3.87 - 5.11 MIL/uL   Hemoglobin 11.3 (*) 12.0 - 15.0 g/dL   HCT 40.9 (*) 81.1 - 91.4 %   MCV 79.3  78.0 - 100.0 fL   MCH 26.6  26.0 - 34.0 pg   MCHC 33.5  30.0 - 36.0 g/dL   RDW 78.2  95.6 - 21.3 %   Platelets 186  150 - 400 K/uL   Neutrophils Relative % 60  43 - 77 %   Neutro Abs 4.8  1.7 - 7.7 K/uL   Lymphocytes Relative 28  12 - 46 %   Lymphs Abs 2.2  0.7 - 4.0  K/uL   Monocytes Relative 10  3 - 12 %   Monocytes Absolute 0.8  0.1 - 1.0 K/uL   Eosinophils Relative 2  0 - 5 %   Eosinophils Absolute 0.2  0.0 - 0.7 K/uL   Basophils Relative 0  0 - 1 %   Basophils Absolute 0.0  0.0 - 0.1 K/uL  COMPREHENSIVE METABOLIC PANEL     Status: Abnormal   Collection Time    03/19/13  1:25 AM      Result Value Range   Sodium 138  135 - 145 mEq/L   Potassium 3.2 (*) 3.5 - 5.1 mEq/L   Chloride 102  96 - 112 mEq/L   CO2 25  19 - 32 mEq/L   Glucose, Bld 109 (*) 70 - 99 mg/dL   BUN 10  6 - 23 mg/dL   Creatinine, Ser 6.04  0.50 - 1.10 mg/dL   Calcium 9.0  8.4 - 54.0 mg/dL   Total Protein 7.0  6.0 - 8.3 g/dL   Albumin 3.3 (*) 3.5 - 5.2 g/dL   AST 11  0 - 37 U/L   ALT 8  0 - 35 U/L   Alkaline Phosphatase 69  39 - 117 U/L   Total Bilirubin 0.3  0.3 - 1.2 mg/dL   GFR calc non Af Amer 71 (*) >90 mL/min   GFR calc Af Amer 82 (*) >90 mL/min     Assessment and Plan   1. Pelvic pain   2/2 menstruation   Stop all PO meds at this time, likely contributing to N/V Has been fully treated with PO and IM meds for PID and UTI May take ibuprofen for cramping and pyridium for urinary discomfort FU in the GYN clinic in 2 weeks   Tawnya Crook 03/19/2013, 2:44 AM

## 2013-03-19 NOTE — MAU Note (Signed)
Pt arrived via EMS for abd pain and vag bleeding.

## 2013-03-20 LAB — URINE CULTURE: Colony Count: 75000

## 2013-03-23 ENCOUNTER — Ambulatory Visit: Payer: Medicaid Other | Admitting: Advanced Practice Midwife

## 2013-04-27 ENCOUNTER — Other Ambulatory Visit (HOSPITAL_COMMUNITY)
Admission: RE | Admit: 2013-04-27 | Discharge: 2013-04-27 | Disposition: A | Discharge status: Home / Self Care | Payer: Medicaid Other | Source: Ambulatory Visit | Attending: Obstetrics & Gynecology | Admitting: Obstetrics & Gynecology

## 2013-04-27 ENCOUNTER — Encounter: Payer: Self-pay | Admitting: Obstetrics & Gynecology

## 2013-04-27 ENCOUNTER — Ambulatory Visit (INDEPENDENT_AMBULATORY_CARE_PROVIDER_SITE_OTHER): Payer: Medicaid Other | Admitting: Obstetrics & Gynecology

## 2013-04-27 VITALS — BP 119/80 | HR 90 | Temp 97.4°F | Ht 66.0 in | Wt 207.0 lb

## 2013-04-27 DIAGNOSIS — R102 Pelvic and perineal pain: Secondary | ICD-10-CM

## 2013-04-27 DIAGNOSIS — Z113 Encounter for screening for infections with a predominantly sexual mode of transmission: Secondary | ICD-10-CM | POA: Insufficient documentation

## 2013-04-27 DIAGNOSIS — N949 Unspecified condition associated with female genital organs and menstrual cycle: Secondary | ICD-10-CM

## 2013-04-27 DIAGNOSIS — A568 Sexually transmitted chlamydial infection of other sites: Secondary | ICD-10-CM | POA: Insufficient documentation

## 2013-04-27 DIAGNOSIS — Z01419 Encounter for gynecological examination (general) (routine) without abnormal findings: Secondary | ICD-10-CM | POA: Insufficient documentation

## 2013-04-27 DIAGNOSIS — Z23 Encounter for immunization: Secondary | ICD-10-CM

## 2013-04-27 NOTE — Addendum Note (Signed)
Addended by: Louanna Raw on: 04/27/2013 11:47 AM   Modules accepted: Orders

## 2013-04-27 NOTE — Patient Instructions (Signed)
Pelvic Pain Pelvic pain is pain below the belly button and located between your hips. Acute pain may last a few hours or days. Chronic pelvic pain may last weeks and months. The cause may be different for different types of pain. The pain may be dull or sharp, mild or severe and can interfere with your daily activities. Write down and tell your caregiver:   Exactly where the pain is located.  If it comes and goes or is there all the time.  When it happens (with sex, urination, bowel movement, etc.)  If the pain is related to your menstrual period or stress. Your caregiver will take a full history and do a complete physical exam and Pap test. CAUSES   Painful menstrual periods (dysmenorrhea).  Normal ovulation (Mittelschmertz) that occurs in the middle of the menstrual cycle every month.  The pelvic organs get engorged with blood just before the menstrual period (pelvic congestive syndrome).  Scar tissue from an infection or past surgery (pelvic adhesions).  Cancer of the female pelvic organs. When there is pain with cancer, it has been there for a long time.  The lining of the uterus (endometrium) abnormally grows in places like the pelvis and on the pelvic organs (endometriosis).  A form of endometriosis with the lining of the uterus present inside of the muscle tissue of the uterus (adenomyosis).  Fibroid tumor (noncancerous) in the uterus.  Bladder problems such as infection, bladder spasms of the muscle tissue of the bladder.  Intestinal problems (irritable bowel syndrome, colitis, an ulcer or gastrointestinal infection).  Polyps of the cervix or uterus.  Pregnancy in the tube (ectopic pregnancy).  The opening of the cervix is too small for the menstrual blood to flow through it (cervical stenosis).  Physical or sexual abuse (past or present).  Musculo-skeletal problems from poor posture, problems with the vertebrae of the lower back or the uterine pelvic muscles falling  (prolapse).  Psychological problems such as depression or stress.  IUD (intrauterine device) in the uterus. DIAGNOSIS  Tests to make a diagnosis depends on the type, location, severity and what causes the pain to occur. Tests that may be needed include:  Blood tests.  Urine tests  Ultrasound.  X-rays.  CT Scan.  MRI.  Laparoscopy.  Major surgery. TREATMENT  Treatment will depend on the cause of the pain, which includes:  Prescription or over-the-counter pain medication.  Antibiotics.  Birth control pills.  Hormone treatment.  Nerve blocking injections.  Physical therapy.  Antidepressants.  Counseling with a psychiatrist or psychologist.  Minor or major surgery. HOME CARE INSTRUCTIONS   Only take over-the-counter or prescription medicines for pain, discomfort or fever as directed by your caregiver.  Follow your caregiver's advice to treat your pain.  Rest.  Avoid sexual intercourse if it causes the pain.  Apply warm or cold compresses (which ever works best) to the pain area.  Do relaxation exercises such as yoga or meditation.  Try acupuncture.  Avoid stressful situations.  Try group therapy.  If the pain is because of a stomach/intestinal upset, drink clear liquids, eat a bland light food diet until the symptoms go away. SEEK MEDICAL CARE IF:   You need stronger prescription pain medication.  You develop pain with sexual intercourse.  You have pain with urination.  You develop a temperature of 102 F (38.9 C) with the pain.  You are still in pain after 4 hours of taking prescription medication for the pain.  You need depression medication.    Your IUD is causing pain and you want it removed. SEEK IMMEDIATE MEDICAL CARE IF:  You develop very severe pain or tenderness.  You faint, have chills, severe weakness or dehydration.  You develop heavy vaginal bleeding or passing solid tissue.  You develop a temperature of 102 F (38.9 C)  with the pain.  You have blood in the urine.  You are being physically or sexually abused.  You have uncontrolled vomiting and diarrhea.  You are depressed and afraid of harming yourself or someone else. Document Released: 08/05/2004 Document Revised: 09/20/2011 Document Reviewed: 05/02/2008 [employer] Patient Information 2013 [employer].  

## 2013-04-27 NOTE — Progress Notes (Signed)
Subjective:     Patient ID: Chloe Rodgers, female   DOB: [birth year], 22 y.o.   MRN: [id number]  HPI Pt was seen in the ED and she was dx'd with chlamydia in Aug and she reports that her partner was treated as well.  She reports that she continues to have pain.  She has not tried any meds to treat the pain.  She would like to get a PAP today.  She is unsure if she has had one in the past. She denies being sexually active with partner since she was initially treated.  Review of Systems     Objective:   Physical Exam BP 119/80  Pulse 90  Temp(Src) 97.4 F (36.3 C) (Oral)  Ht 5\' 6"  (1.676 m)  Wt 207 lb (93.895 kg)  BMI 33.43 kg/m2  Pt in NAD Abd: soft, NT, ND, obese GU: EGBUS: no lesions Vagina: no blood in vault Cervix: no lesion; no mucopurulent d/c Uterus: small, mobile Adnexa: no masses; non tender         Assessment:     Pelvic pain Screening pap H/o chlamydia infection       Plan:     F/u PAP and cx Motrin OTC prn F/u 1 year or sooner prn Flu vaccine

## 2013-04-28 LAB — GC/CHLAMYDIA PROBE AMP
CT Probe RNA: NEGATIVE
GC Probe RNA: NEGATIVE

## 2013-05-09 ENCOUNTER — Encounter: Payer: Self-pay | Admitting: *Deleted

## 2013-05-24 ENCOUNTER — Encounter (HOSPITAL_COMMUNITY): Payer: Self-pay | Admitting: Emergency Medicine

## 2013-05-24 ENCOUNTER — Emergency Department (HOSPITAL_COMMUNITY)
Admission: EM | Admit: 2013-05-24 | Discharge: 2013-05-25 | Disposition: A | Discharge status: Home / Self Care | Payer: Medicaid Other | Attending: Emergency Medicine | Admitting: Emergency Medicine

## 2013-05-24 DIAGNOSIS — Z862 Personal history of diseases of the blood and blood-forming organs and certain disorders involving the immune mechanism: Secondary | ICD-10-CM | POA: Insufficient documentation

## 2013-05-24 DIAGNOSIS — Z8742 Personal history of other diseases of the female genital tract: Secondary | ICD-10-CM | POA: Insufficient documentation

## 2013-05-24 DIAGNOSIS — Z8619 Personal history of other infectious and parasitic diseases: Secondary | ICD-10-CM | POA: Insufficient documentation

## 2013-05-24 DIAGNOSIS — Z8659 Personal history of other mental and behavioral disorders: Secondary | ICD-10-CM | POA: Insufficient documentation

## 2013-05-24 DIAGNOSIS — Z87440 Personal history of urinary (tract) infections: Secondary | ICD-10-CM | POA: Insufficient documentation

## 2013-05-24 DIAGNOSIS — Z872 Personal history of diseases of the skin and subcutaneous tissue: Secondary | ICD-10-CM | POA: Insufficient documentation

## 2013-05-24 DIAGNOSIS — G43909 Migraine, unspecified, not intractable, without status migrainosus: Secondary | ICD-10-CM | POA: Insufficient documentation

## 2013-05-24 LAB — CBC
HCT: 37.8 % (ref 36.0–46.0)
Hemoglobin: 12.9 g/dL (ref 12.0–15.0)
MCH: 27.3 pg (ref 26.0–34.0)
MCHC: 34.1 g/dL (ref 30.0–36.0)
MCV: 80.1 fL (ref 78.0–100.0)
Platelets: 212 10*3/uL (ref 150–400)
RBC: 4.72 MIL/uL (ref 3.87–5.11)
RDW: 14.3 % (ref 11.5–15.5)
WBC: 8.4 10*3/uL (ref 4.0–10.5)

## 2013-05-24 LAB — POCT I-STAT, CHEM 8
BUN: 20 mg/dL (ref 6–23)
Calcium, Ion: 1.27 mmol/L — ABNORMAL HIGH (ref 1.12–1.23)
Chloride: 103 mEq/L (ref 96–112)
Creatinine, Ser: 1.1 mg/dL (ref 0.50–1.10)
Glucose, Bld: 69 mg/dL — ABNORMAL LOW (ref 70–99)
HCT: 42 % (ref 36.0–46.0)
Hemoglobin: 14.3 g/dL (ref 12.0–15.0)
Potassium: 4 mEq/L (ref 3.5–5.1)
Sodium: 142 mEq/L (ref 135–145)
TCO2: 25 mmol/L (ref 0–100)

## 2013-05-24 MED ORDER — METOCLOPRAMIDE HCL 5 MG/ML IJ SOLN: 10.0000 mg | Freq: Once | INTRAMUSCULAR | Status: DC

## 2013-05-24 MED ORDER — KETOROLAC TROMETHAMINE 30 MG/ML IJ SOLN
30.0000 mg | Freq: Once | INTRAMUSCULAR | Status: AC
  Administered 2013-05-24: 30 mg via INTRAVENOUS
  Filled 2013-05-24: qty 1

## 2013-05-24 MED ORDER — SODIUM CHLORIDE 0.9 % IV BOLUS (SEPSIS)
1000.0000 mL | Freq: Once | INTRAVENOUS | Status: DC
  Administered 2013-05-24: 1000 mL via INTRAVENOUS

## 2013-05-24 MED ORDER — METOCLOPRAMIDE HCL 5 MG/ML IJ SOLN
10.0000 mg | Freq: Once | INTRAMUSCULAR | Status: AC
  Administered 2013-05-24: 10 mg via INTRAVENOUS
  Filled 2013-05-24: qty 2

## 2013-05-24 MED ORDER — KETOROLAC TROMETHAMINE 30 MG/ML IJ SOLN: 30.0000 mg | Freq: Once | INTRAMUSCULAR | Status: DC

## 2013-05-24 MED ORDER — DIPHENHYDRAMINE HCL 50 MG/ML IJ SOLN: 12.5000 mg | Freq: Once | INTRAMUSCULAR | Status: DC

## 2013-05-24 MED ORDER — SODIUM CHLORIDE 0.9 % IV BOLUS (SEPSIS): 1000.0000 mL | Freq: Once | INTRAVENOUS | Status: DC

## 2013-05-24 NOTE — ED Notes (Signed)
Attempted to call x2 to move pt to room. No answer

## 2013-05-24 NOTE — ED Notes (Signed)
Rec'd in room A1, lights turned down due to photophobia.  HA began 3 days ago per pt.  Also reports she tends to have seizures after her migraines, bed rails padded for pt safety.  Family members at bedside.

## 2013-05-24 NOTE — ED Notes (Signed)
Patient with 3 day history of migraine.  Patient states she tried APAP for it and it has not worked.  Patient denies any vomiting, but she has had nausea.  Patient states she has been having some sensitivity to light and noise.

## 2013-05-24 NOTE — ED Provider Notes (Signed)
CSN: [id number]     Arrival date & time 05/24/13  1859 History   First MD Initiated Contact with Patient 05/24/13 2123     Chief Complaint  Patient presents with  . Migraine   (Consider location/radiation/quality/duration/timing/severity/associated sxs/prior Treatment) Patient is a 22 y.o. female presenting with migraines.  Migraine   Pt with history of migraine and ?seizures reports moderate to severe throbbing L sided headache for the last 3 days associated with photophobia and nausea not improved with APAP at home and similar to previous. Was previously on prophylactic meds but taken off during pregnancy. Has not been back to Neuro since.   Past Medical History  Diagnosis Date  . Anemia   . Depression   . Complication of anesthesia     ??, seizure with wisdom teeth  . Urinary tract infection   . Psoriasis   . PID (acute pelvic inflammatory disease)   . Chlamydia   . PONV (postoperative nausea and vomiting)   . Seizures     July 2013 - [place]  . Headache(784.0)     otc med prn  . Anxiety    Past Surgical History  Procedure Laterality Date  . Cesarean section    . Skin graft      off abd, onto arm  . Wisdom tooth extraction    . Cesarean section  06/06/2012    Procedure: CESAREAN SECTION;  Surgeon: Adam Phenix, MD;  Location: [facility];  Service: Obstetrics;  Laterality: N/A;   Family History  Problem Relation Age of Onset  . Hypertension Mother   . Diabetes Mother   . Cancer Mother     BREAST  . Stroke Mother   . Seizures Mother   . Asthma Daughter   . Hypertension Maternal Grandmother   . Diabetes Maternal Grandmother   . Cancer Maternal Grandmother     BONE CANCER  . Other Neg Hx    History  Substance Use Topics  . Smoking status: Never Smoker   . Smokeless tobacco: Never Used  . Alcohol Use: Yes     Comment: socially but none with pregnancy   OB History   Grav Para Term Preterm Abortions TAB SAB Ect Mult Living   2 2 2  0 0 0 0 0 0 2      Review of Systems All other systems reviewed and are negative except as noted in HPI.   Allergies  Review of patient's allergies indicates no known allergies.  Home Medications  No current outpatient prescriptions on file. BP 110/66  Pulse 80  Temp(Src) 97.9 F (36.6 C) (Oral)  Resp 16  Ht 5\' 6"  (1.676 m)  Wt 204 lb (92.534 kg)  BMI 32.94 kg/m2  SpO2 100%  LMP 05/05/2013 Physical Exam  Nursing note and vitals reviewed. Constitutional: She is oriented to person, place, and time. She appears well-developed and well-nourished.  HENT:  Head: Normocephalic and atraumatic.  Eyes: EOM are normal. Pupils are equal, round, and reactive to light.  Neck: Normal range of motion. Neck supple.  Cardiovascular: Normal rate, normal heart sounds and intact distal pulses.   Pulmonary/Chest: Effort normal and breath sounds normal.  Abdominal: Bowel sounds are normal. She exhibits no distension. There is no tenderness.  Musculoskeletal: Normal range of motion. She exhibits no edema and no tenderness.  Neurological: She is alert and oriented to person, place, and time. She has normal strength. No cranial nerve deficit or sensory deficit.  Skin: Skin is warm and dry. No rash  noted.  Psychiatric: She has a normal mood and affect.    ED Course  Procedures (including critical care time) Labs Review Labs Reviewed  POCT I-STAT, CHEM 8 - Abnormal; Notable for the following:    Glucose, Bld 69 (*)    Calcium, Ion 1.27 (*)    All other components within normal limits  CBC   Imaging Review No results found.  EKG Interpretation   None       MDM   1. Migraine     Labs ordered in triage reviewed. Pt pain free after Toradol/Reglan and IVF, ready to go home. Neuro followup.     Enaya Howze B. Bernette Mayers, MD 05/24/13 [phone]

## 2013-06-20 ENCOUNTER — Emergency Department (HOSPITAL_COMMUNITY)
Admission: EM | Admit: 2013-06-20 | Discharge: 2013-06-20 | Disposition: A | Discharge status: Home / Self Care | Payer: Medicaid Other | Attending: Emergency Medicine | Admitting: Emergency Medicine

## 2013-06-20 ENCOUNTER — Encounter (HOSPITAL_COMMUNITY): Payer: Self-pay | Admitting: Emergency Medicine

## 2013-06-20 DIAGNOSIS — N760 Acute vaginitis: Secondary | ICD-10-CM | POA: Insufficient documentation

## 2013-06-20 DIAGNOSIS — F3289 Other specified depressive episodes: Secondary | ICD-10-CM | POA: Insufficient documentation

## 2013-06-20 DIAGNOSIS — N730 Acute parametritis and pelvic cellulitis: Secondary | ICD-10-CM | POA: Insufficient documentation

## 2013-06-20 DIAGNOSIS — IMO0002 Reserved for concepts with insufficient information to code with codable children: Secondary | ICD-10-CM | POA: Insufficient documentation

## 2013-06-20 DIAGNOSIS — B9689 Other specified bacterial agents as the cause of diseases classified elsewhere: Secondary | ICD-10-CM

## 2013-06-20 DIAGNOSIS — N898 Other specified noninflammatory disorders of vagina: Secondary | ICD-10-CM | POA: Insufficient documentation

## 2013-06-20 DIAGNOSIS — F329 Major depressive disorder, single episode, unspecified: Secondary | ICD-10-CM | POA: Insufficient documentation

## 2013-06-20 DIAGNOSIS — F411 Generalized anxiety disorder: Secondary | ICD-10-CM | POA: Insufficient documentation

## 2013-06-20 HISTORY — DX: Migraine, unspecified, not intractable, without status migrainosus: G43.909

## 2013-06-20 LAB — URINALYSIS, ROUTINE W REFLEX MICROSCOPIC
Bilirubin Urine: NEGATIVE
Glucose, UA: NEGATIVE mg/dL
Hgb urine dipstick: NEGATIVE
Ketones, ur: NEGATIVE mg/dL
Nitrite: NEGATIVE
Protein, ur: NEGATIVE mg/dL
Specific Gravity, Urine: 1.026 (ref 1.005–1.030)
Urobilinogen, UA: 0.2 mg/dL (ref 0.0–1.0)
pH: 6 (ref 5.0–8.0)

## 2013-06-20 LAB — URINE MICROSCOPIC-ADD ON

## 2013-06-20 LAB — COMPREHENSIVE METABOLIC PANEL
ALT: 9 U/L (ref 0–35)
AST: 12 U/L (ref 0–37)
Albumin: 3.8 g/dL (ref 3.5–5.2)
Alkaline Phosphatase: 72 U/L (ref 39–117)
BUN: 14 mg/dL (ref 6–23)
CO2: 22 mEq/L (ref 19–32)
Calcium: 9.2 mg/dL (ref 8.4–10.5)
Chloride: 104 mEq/L (ref 96–112)
Creatinine, Ser: 0.77 mg/dL (ref 0.50–1.10)
GFR calc Af Amer: >90 mL/min (ref >90)
GFR calc non Af Amer: >90 mL/min (ref >90)
Glucose, Bld: 93 mg/dL (ref 70–99)
Potassium: 4.1 mEq/L (ref 3.5–5.1)
Sodium: 138 mEq/L (ref 135–145)
Total Bilirubin: 0.3 mg/dL (ref 0.3–1.2)
Total Protein: 8 g/dL (ref 6.0–8.3)

## 2013-06-20 LAB — WET PREP, GENITAL
Trich, Wet Prep: NONE SEEN
Yeast Wet Prep HPF POC: NONE SEEN

## 2013-06-20 LAB — CBC WITH DIFFERENTIAL/PLATELET
Basophils Absolute: 0 10*3/uL (ref 0.0–0.1)
Basophils Relative: 0 % (ref 0–1)
Eosinophils Absolute: 0 10*3/uL (ref 0.0–0.7)
Eosinophils Relative: 1 % (ref 0–5)
HCT: 38.2 % (ref 36.0–46.0)
Hemoglobin: 12.7 g/dL (ref 12.0–15.0)
Lymphocytes Relative: 23 % (ref 12–46)
Lymphs Abs: 1.4 10*3/uL (ref 0.7–4.0)
MCH: 27 pg (ref 26.0–34.0)
MCHC: 33.2 g/dL (ref 30.0–36.0)
MCV: 81.3 fL (ref 78.0–100.0)
Monocytes Absolute: 0.5 10*3/uL (ref 0.1–1.0)
Monocytes Relative: 9 % (ref 3–12)
Neutro Abs: 4 10*3/uL (ref 1.7–7.7)
Neutrophils Relative %: 67 % (ref 43–77)
Platelets: 182 10*3/uL (ref 150–400)
RBC: 4.7 MIL/uL (ref 3.87–5.11)
RDW: 14.7 % (ref 11.5–15.5)
WBC: 5.9 10*3/uL (ref 4.0–10.5)

## 2013-06-20 LAB — GC/CHLAMYDIA PROBE AMP
CT Probe RNA: NEGATIVE
GC Probe RNA: NEGATIVE

## 2013-06-20 LAB — POCT PREGNANCY, URINE: Preg Test, Ur: NEGATIVE

## 2013-06-20 MED ORDER — HYDROCODONE-ACETAMINOPHEN 5-325 MG PO TABS: 1.0000 | ORAL_TABLET | ORAL | Status: DC | PRN

## 2013-06-20 MED ORDER — STERILE WATER FOR INJECTION IJ SOLN
INTRAMUSCULAR | Status: AC
  Administered 2013-06-20: 1 mL
  Filled 2013-06-20: qty 10

## 2013-06-20 MED ORDER — DOXYCYCLINE HYCLATE 100 MG PO CAPS: 100.0000 mg | ORAL_CAPSULE | Freq: Two times a day (BID) | ORAL | Status: DC

## 2013-06-20 MED ORDER — AZITHROMYCIN 250 MG PO TABS
1000.0000 mg | ORAL_TABLET | Freq: Once | ORAL | Status: AC
  Administered 2013-06-20: 1000 mg via ORAL
  Filled 2013-06-20: qty 4

## 2013-06-20 MED ORDER — HYDROCODONE-ACETAMINOPHEN 5-325 MG PO TABS
1.0000 | ORAL_TABLET | Freq: Once | ORAL | Status: AC
  Administered 2013-06-20: 1 via ORAL
  Filled 2013-06-20: qty 1

## 2013-06-20 MED ORDER — ONDANSETRON 4 MG PO TBDP
4.0000 mg | ORAL_TABLET | Freq: Once | ORAL | Status: AC
  Administered 2013-06-20: 4 mg via ORAL
  Filled 2013-06-20: qty 1

## 2013-06-20 MED ORDER — CEFTRIAXONE SODIUM 250 MG IJ SOLR
250.0000 mg | Freq: Once | INTRAMUSCULAR | Status: AC
  Administered 2013-06-20: 250 mg via INTRAMUSCULAR
  Filled 2013-06-20: qty 250

## 2013-06-20 MED ORDER — METRONIDAZOLE 500 MG PO TABS: 500.0000 mg | ORAL_TABLET | Freq: Two times a day (BID) | ORAL | Status: DC

## 2013-06-20 NOTE — ED Provider Notes (Signed)
Medical screening examination/treatment/procedure(s) were performed by non-physician practitioner and as supervising physician I was immediately available for consultation/collaboration.  Beatryce Colombo T Reinhard Schack, MD 06/20/13 1540 

## 2013-06-20 NOTE — ED Notes (Signed)
Pt is here with lower back pain for the past couple of days, reports brown/clear vag discharge, reports vomiting.  Pt reports migraine that precipitates seizures

## 2013-06-20 NOTE — ED Provider Notes (Signed)
CSN: [id number]     Arrival date & time 06/20/13  6578 History   First MD Initiated Contact with Patient 06/20/13 [phone]     Chief Complaint  Patient presents with  . Abdominal Pain  . Migraine   (Consider location/radiation/quality/duration/timing/severity/associated sxs/prior Treatment) HPI  22 year-old female presents complaining of low back pain and lower bowel pain. Patient states she has had chronic pain to the right flank which has been ongoing for nearly a year. Pain is persistent, sharp and achy. Within the past 2 weeks she is having worsening pain to her right flank as well as her low abdomen. Pain is worsened with movement. She reports decreased appetite but no pain with eating. She reports having brown to clear vaginal discharge ongoing for the past 3-4 days. Also reports pain with sexual activities. Has prior history of PID, and yeast infection. Has been with the same sexual partner for the past 6 months and uses protection. No complaints of fever, chills, headache, chest pain, shortness of breath, productive cough, hemoptysis, dysuria, hematuria, hematochezia or melena. She has been taking tramadol at home with minimal relief.  No recent trauma.    Past Medical History  Diagnosis Date  . Anemia   . Depression   . Complication of anesthesia     ??, seizure with wisdom teeth  . Urinary tract infection   . Psoriasis   . PID (acute pelvic inflammatory disease)   . Chlamydia   . PONV (postoperative nausea and vomiting)   . Seizures     July 2013 - [place]  . Headache(784.0)     otc med prn  . Anxiety   . Migraine    Past Surgical History  Procedure Laterality Date  . Cesarean section    . Skin graft      off abd, onto arm  . Wisdom tooth extraction    . Cesarean section  06/06/2012    Procedure: CESAREAN SECTION;  Surgeon: Adam Phenix, MD;  Location: [facility];  Service: Obstetrics;  Laterality: N/A;   Family History  Problem Relation Age of Onset  .  Hypertension Mother   . Diabetes Mother   . Cancer Mother     BREAST  . Stroke Mother   . Seizures Mother   . Asthma Daughter   . Hypertension Maternal Grandmother   . Diabetes Maternal Grandmother   . Cancer Maternal Grandmother     BONE CANCER  . Other Neg Hx    History  Substance Use Topics  . Smoking status: Never Smoker   . Smokeless tobacco: Never Used  . Alcohol Use: Yes     Comment: socially but none with pregnancy   OB History   Grav Para Term Preterm Abortions TAB SAB Ect Mult Living   2 2 2  0 0 0 0 0 0 2     Review of Systems  All other systems reviewed and are negative.    Allergies  Review of patient's allergies indicates no known allergies.  Home Medications   Current Outpatient Rx  Name  Route  Sig  Dispense  Refill  . traMADol (ULTRAM) 50 MG tablet   Oral   Take 50 mg by mouth 3 (three) times daily as needed for moderate pain.          BP 123/74  Pulse 79  Temp(Src) 97.4 F (36.3 C) (Oral)  Resp 18  SpO2 96%  LMP 06/06/2013 Physical Exam  Nursing note and vitals reviewed. Constitutional: She appears  well-developed and well-nourished. No distress.  HENT:  Head: Normocephalic and atraumatic.  Eyes: Conjunctivae are normal.  Neck: Normal range of motion. Neck supple.  Cardiovascular: Normal rate and regular rhythm.   Pulmonary/Chest: Effort normal and breath sounds normal. She exhibits no tenderness.  Abdominal: Soft. Bowel sounds are increased. There is tenderness in the periumbilical area. There is no rigidity, no rebound, no guarding, no CVA tenderness, no tenderness at McBurney's point and negative Murphy's sign. No hernia. Hernia confirmed negative in the ventral area.  Genitourinary: Uterus normal. There is no rash or lesion on the right labia. There is no rash or lesion on the left labia. Cervix exhibits motion tenderness, discharge and friability. Right adnexum displays tenderness. Right adnexum displays no mass. Left adnexum displays  tenderness. Left adnexum displays no mass. No erythema, tenderness or bleeding around the vagina. Vaginal discharge found.  Chaperone present  Pt with bilateral adnexal tenderness along with CMT.  Moderate white vaginal discharge noted.  Pt has discomfort with speculum insertion.    Lymphadenopathy:       Right: No inguinal adenopathy present.       Left: No inguinal adenopathy present.    ED Course  Procedures (including critical care time)    10:28 AM Hx of PID, has significant tenderness to bilateral adnexa and CMT on pelvic exam.  Abdomen otherwise without peritoneal sign.  No Murphy sign to suggest biliary disease.  Due to prolonged duration of sxs, doubt appendicitis.  Will preemptively treat pt with rocephin/zithromax.  Other wise her electrolytes are reassuring, normal WBC, preg test negative, and UA without evidence of UTI.  I do not suspect tubo-ovarian abscess, ovarian torsion at this time.  11:39 AM Wet prep shows clue cells too numerous to count and WBC too numerous to count which further suggests patient has evidence of cervicitis/PID. She is currently not pregnant and currently not breast-feeding. We'll also prescribed for doxycycline as treatment for her PID, and Flagyl for BV. Patient is aware not to resume sexual activities until the symptoms resolved. GC and Chlamydia culture sent. Return precautions discussed. Patient symptoms is now improved and she is able to tolerates by mouth. She is stable for discharge.  Labs Review Labs Reviewed  WET PREP, GENITAL - Abnormal; Notable for the following:    Clue Cells Wet Prep HPF POC TOO NUMEROUS TO COUNT (*)    WBC, Wet Prep HPF POC TOO NUMEROUS TO COUNT (*)    All other components within normal limits  URINALYSIS, ROUTINE W REFLEX MICROSCOPIC - Abnormal; Notable for the following:    APPearance CLOUDY (*)    Leukocytes, UA MODERATE (*)    All other components within normal limits  URINE MICROSCOPIC-ADD ON - Abnormal; Notable  for the following:    Squamous Epithelial / LPF MANY (*)    All other components within normal limits  GC/CHLAMYDIA PROBE AMP  COMPREHENSIVE METABOLIC PANEL  CBC WITH DIFFERENTIAL  POCT PREGNANCY, URINE   Imaging Review No results found.  EKG Interpretation   None       MDM   1. PID (acute pelvic inflammatory disease)   2. BV (bacterial vaginosis)    BP 108/66  Pulse 100  Temp(Src) 98.4 F (36.9 C) (Oral)  Resp 14  SpO2 100%  LMP 06/06/2013  I have reviewed nursing notes and vital signs. I reviewed available ER/hospitalization records thought the EMR     Fayrene Helper, [place] 06/20/13 1143

## 2013-06-21 ENCOUNTER — Emergency Department (HOSPITAL_COMMUNITY)
Admission: EM | Admit: 2013-06-21 | Discharge: 2013-06-21 | Disposition: A | Discharge status: Home / Self Care | Payer: Medicaid Other | Attending: Emergency Medicine | Admitting: Emergency Medicine

## 2013-06-21 ENCOUNTER — Encounter (HOSPITAL_COMMUNITY): Payer: Self-pay | Admitting: Emergency Medicine

## 2013-06-21 DIAGNOSIS — Z79899 Other long term (current) drug therapy: Secondary | ICD-10-CM | POA: Insufficient documentation

## 2013-06-21 DIAGNOSIS — F3289 Other specified depressive episodes: Secondary | ICD-10-CM | POA: Insufficient documentation

## 2013-06-21 DIAGNOSIS — Z862 Personal history of diseases of the blood and blood-forming organs and certain disorders involving the immune mechanism: Secondary | ICD-10-CM | POA: Insufficient documentation

## 2013-06-21 DIAGNOSIS — R55 Syncope and collapse: Secondary | ICD-10-CM | POA: Insufficient documentation

## 2013-06-21 DIAGNOSIS — Z87440 Personal history of urinary (tract) infections: Secondary | ICD-10-CM | POA: Insufficient documentation

## 2013-06-21 DIAGNOSIS — R5381 Other malaise: Secondary | ICD-10-CM | POA: Insufficient documentation

## 2013-06-21 DIAGNOSIS — Z8619 Personal history of other infectious and parasitic diseases: Secondary | ICD-10-CM | POA: Insufficient documentation

## 2013-06-21 DIAGNOSIS — G43909 Migraine, unspecified, not intractable, without status migrainosus: Secondary | ICD-10-CM | POA: Insufficient documentation

## 2013-06-21 DIAGNOSIS — F411 Generalized anxiety disorder: Secondary | ICD-10-CM | POA: Insufficient documentation

## 2013-06-21 DIAGNOSIS — Z872 Personal history of diseases of the skin and subcutaneous tissue: Secondary | ICD-10-CM | POA: Insufficient documentation

## 2013-06-21 DIAGNOSIS — F329 Major depressive disorder, single episode, unspecified: Secondary | ICD-10-CM | POA: Insufficient documentation

## 2013-06-21 DIAGNOSIS — Z8742 Personal history of other diseases of the female genital tract: Secondary | ICD-10-CM | POA: Insufficient documentation

## 2013-06-21 DIAGNOSIS — R404 Transient alteration of awareness: Secondary | ICD-10-CM | POA: Insufficient documentation

## 2013-06-21 DIAGNOSIS — R42 Dizziness and giddiness: Secondary | ICD-10-CM | POA: Insufficient documentation

## 2013-06-21 DIAGNOSIS — Z3202 Encounter for pregnancy test, result negative: Secondary | ICD-10-CM | POA: Insufficient documentation

## 2013-06-21 DIAGNOSIS — Z792 Long term (current) use of antibiotics: Secondary | ICD-10-CM | POA: Insufficient documentation

## 2013-06-21 LAB — URINE MICROSCOPIC-ADD ON

## 2013-06-21 LAB — URINALYSIS, ROUTINE W REFLEX MICROSCOPIC
Bilirubin Urine: NEGATIVE
Glucose, UA: NEGATIVE mg/dL
Hgb urine dipstick: NEGATIVE
Ketones, ur: NEGATIVE mg/dL
Nitrite: NEGATIVE
Protein, ur: NEGATIVE mg/dL
Specific Gravity, Urine: 1.029 (ref 1.005–1.030)
Urobilinogen, UA: 1 mg/dL (ref 0.0–1.0)
pH: 6.5 (ref 5.0–8.0)

## 2013-06-21 LAB — POCT PREGNANCY, URINE: Preg Test, Ur: NEGATIVE

## 2013-06-21 LAB — POCT I-STAT TROPONIN I: Troponin i, poc: 0 ng/mL (ref 0.00–0.08)

## 2013-06-21 MED ORDER — DIPHENHYDRAMINE HCL 50 MG/ML IJ SOLN
50.0000 mg | Freq: Once | INTRAMUSCULAR | Status: AC
  Administered 2013-06-21: 50 mg via INTRAVENOUS
  Filled 2013-06-21: qty 1

## 2013-06-21 MED ORDER — DEXAMETHASONE SODIUM PHOSPHATE 10 MG/ML IJ SOLN
10.0000 mg | Freq: Once | INTRAMUSCULAR | Status: AC
  Administered 2013-06-21: 10 mg via INTRAVENOUS
  Filled 2013-06-21: qty 1

## 2013-06-21 MED ORDER — METOCLOPRAMIDE HCL 5 MG/ML IJ SOLN
10.0000 mg | Freq: Once | INTRAMUSCULAR | Status: AC
  Administered 2013-06-21: 10 mg via INTRAVENOUS
  Filled 2013-06-21: qty 2

## 2013-06-21 MED ORDER — SODIUM CHLORIDE 0.9 % IV BOLUS (SEPSIS)
1000.0000 mL | Freq: Once | INTRAVENOUS | Status: AC
  Administered 2013-06-21: 1000 mL via INTRAVENOUS

## 2013-06-21 NOTE — ED Notes (Addendum)
pe rems: pt had a near syncopal episode at home. Friends called 911 stating the pt might have fainted." ems found pt a&Ox4 on arrival to scene, lying on floor with out injuries. VSS, NSR, cbg 77 en route. Pt told ems she has not felt like eating in the past few days and her friends state she has "been stressed out"

## 2013-06-21 NOTE — ED Provider Notes (Signed)
CSN: [id number]     Arrival date & time 06/21/13  1736 History   First MD Initiated Contact with Patient 06/21/13 1933     Chief Complaint  Patient presents with  . Weakness  . Migraine   (Consider location/radiation/quality/duration/timing/severity/associated sxs/prior Treatment) Patient is a 22 y.o. female presenting with migraines and syncope. The history is provided by the patient. No language interpreter was used.  Migraine This is a recurrent problem. The current episode started more than 1 week ago (1 month). The problem occurs constantly. The problem has not changed since onset.Associated symptoms include headaches. Pertinent negatives include no chest pain, no abdominal pain and no shortness of breath. Exacerbated by: light, noise, movement. The symptoms are relieved by rest and lying down. Treatments tried: imitrex. The treatment provided no relief.  Loss of Consciousness Episode history:  Single Most recent episode:  Today Timing:  Rare Progression:  Resolved Chronicity:  Recurrent Context: standing up   Witnessed: no   Relieved by:  Lying down Worsened by:  Nothing tried Ineffective treatments:  None tried Associated symptoms: dizziness, headaches and visual change   Associated symptoms: no chest pain, no confusion, no diaphoresis, no fever, no nausea, no palpitations, no shortness of breath, no vomiting and no weakness   Visual Change:    Location:  Both eyes   Quality: tunnel vision     Onset quality:  Sudden   Progression:  Resolved   Chronicity:  Recurrent   Past Medical History  Diagnosis Date  . Anemia   . Depression   . Complication of anesthesia     ??, seizure with wisdom teeth  . Urinary tract infection   . Psoriasis   . PID (acute pelvic inflammatory disease)   . Chlamydia   . PONV (postoperative nausea and vomiting)   . Seizures     July 2013 - [place]  . Headache(784.0)     otc med prn  . Anxiety   . Migraine    Past Surgical History   Procedure Laterality Date  . Cesarean section    . Skin graft      off abd, onto arm  . Wisdom tooth extraction    . Cesarean section  06/06/2012    Procedure: CESAREAN SECTION;  Surgeon: Adam Phenix, MD;  Location: [facility];  Service: Obstetrics;  Laterality: N/A;   Family History  Problem Relation Age of Onset  . Hypertension Mother   . Diabetes Mother   . Cancer Mother     BREAST  . Stroke Mother   . Seizures Mother   . Asthma Daughter   . Hypertension Maternal Grandmother   . Diabetes Maternal Grandmother   . Cancer Maternal Grandmother     BONE CANCER  . Other Neg Hx    History  Substance Use Topics  . Smoking status: Never Smoker   . Smokeless tobacco: Never Used  . Alcohol Use: Yes     Comment: socially but none with pregnancy   OB History   Grav Para Term Preterm Abortions TAB SAB Ect Mult Living   2 2 2  0 0 0 0 0 0 2     Review of Systems  Constitutional: Negative for fever, chills, diaphoresis, activity change, appetite change and fatigue.  HENT: Negative for congestion, facial swelling, rhinorrhea and sore throat.   Eyes: Negative for photophobia and discharge.  Respiratory: Negative for cough, chest tightness and shortness of breath.   Cardiovascular: Positive for syncope. Negative for chest pain, palpitations  and leg swelling.  Gastrointestinal: Negative for nausea, vomiting, abdominal pain and diarrhea.  Endocrine: Negative for polydipsia and polyuria.  Genitourinary: Negative for dysuria, frequency, difficulty urinating and pelvic pain.  Musculoskeletal: Negative for arthralgias, back pain, neck pain and neck stiffness.  Skin: Negative for color change and wound.  Allergic/Immunologic: Negative for immunocompromised state.  Neurological: Positive for dizziness, syncope and headaches. Negative for facial asymmetry, weakness and numbness.  Hematological: Does not bruise/bleed easily.  Psychiatric/Behavioral: Negative for confusion and agitation.     Allergies  Review of patient's allergies indicates no known allergies.  Home Medications   Current Outpatient Rx  Name  Route  Sig  Dispense  Refill  . doxycycline (VIBRAMYCIN) 100 MG capsule   Oral   Take 1 capsule (100 mg total) by mouth 2 (two) times daily. One po bid x 7 days   14 capsule   0   . metroNIDAZOLE (FLAGYL) 500 MG tablet   Oral   Take 1 tablet (500 mg total) by mouth 2 (two) times daily.   14 tablet   0   . SUMAtriptan (IMITREX) 50 MG tablet   Oral   Take 50 mg by mouth every 2 (two) hours as needed for migraine or headache. May repeat in 2 hours if headache persists or recurs.         . traMADol (ULTRAM) 50 MG tablet   Oral   Take 50-100 mg by mouth every 6 (six) hours as needed for moderate pain.         Marland Kitchen HYDROcodone-acetaminophen (NORCO/VICODIN) 5-325 MG per tablet   Oral   Take 1 tablet by mouth every 4 (four) hours as needed.   15 tablet   0    BP 104/59  Pulse 72  Temp(Src) 98.8 F (37.1 C) (Oral)  Resp 13  Ht 5\' 6"  (1.676 m)  Wt 200 lb (90.719 kg)  BMI 32.30 kg/m2  SpO2 99%  LMP 06/06/2013 Physical Exam  Constitutional: She is oriented to person, place, and time. She appears well-developed and well-nourished. No distress.  HENT:  Head: Normocephalic and atraumatic.  Mouth/Throat: No oropharyngeal exudate.  Eyes: Pupils are equal, round, and reactive to light.  Neck: Normal range of motion. Neck supple.  Cardiovascular: Normal rate, regular rhythm and normal heart sounds.  Exam reveals no gallop and no friction rub.   No murmur heard. Pulmonary/Chest: Effort normal and breath sounds normal. No respiratory distress. She has no wheezes. She has no rales.  Abdominal: Soft. Bowel sounds are normal. She exhibits no distension and no mass. There is no tenderness. There is no rebound and no guarding.  Musculoskeletal: Normal range of motion. She exhibits no edema and no tenderness.  Neurological: She is alert and oriented to person,  place, and time.  Skin: Skin is warm and dry.  Psychiatric: She has a normal mood and affect.    ED Course  Procedures (including critical care time) Labs Review Labs Reviewed  URINALYSIS, ROUTINE W REFLEX MICROSCOPIC - Abnormal; Notable for the following:    Leukocytes, UA MODERATE (*)    All other components within normal limits  URINE MICROSCOPIC-ADD ON - Abnormal; Notable for the following:    Squamous Epithelial / LPF MANY (*)    All other components within normal limits  POCT I-STAT TROPONIN I  POCT PREGNANCY, URINE   Imaging Review No results found.  EKG Interpretation    Date/Time:  Thursday June 21 2013 17:44:09 EST Ventricular Rate:  89 PR Interval:  148 QRS Duration: 82 QT Interval:  364 QTC Calculation: 442 R Axis:   65 Text Interpretation:  Normal sinus rhythm Normal ECG Confirmed by DOCHERTY  MD, MEGAN ([id number]) on 06/21/2013 7:34:24 PM            MDM   1. Migraine   2. Syncope    Pt is a 22 y.o. female with Pmhx as above who presents with chronic migraine and syncopal episode.  Pt states she has had migraines for years which have been worse past several months and has now has had daily, constant migraine for about 1 month with R sided sharp/throbbing pain, nausea, photophobia, phonophobia.  She states she has had 4-5 syncopal episodes this year alone when her migraines have been bad and that she was feeling lightheaded, vision was going black when she tried to walk upstairs into the bathroom and had syncopal episode.  Friend witnessed brief episode of shaking. Pt feeling mildly lightheaded now.  On PE, Pt in NAD.  No focal neuro findings, pt able to ambulate to and from bathroom w/o difficulty.  Given she has had multiple prior episodes, I do not feel neuro imaging needed.  Pt feeling improved after IVF, migraine cocktail.  She will f/u with her PCP tomorrow and will discuss outpt neurology referral.          Shanna Cisco, MD 06/22/13 1413

## 2013-06-21 NOTE — ED Notes (Signed)
Pt. C/o migraines for x 2 days. Is on medication for migraines, not helping. Pt. Is to see a neurologists but not has gone yet.

## 2013-06-29 ENCOUNTER — Encounter: Payer: Self-pay | Admitting: Obstetrics & Gynecology

## 2013-06-29 ENCOUNTER — Ambulatory Visit (INDEPENDENT_AMBULATORY_CARE_PROVIDER_SITE_OTHER): Payer: Medicaid Other | Admitting: Obstetrics & Gynecology

## 2013-06-29 VITALS — BP 119/81 | HR 100 | Temp 97.2°F | Ht 66.0 in | Wt 201.4 lb

## 2013-06-29 DIAGNOSIS — Z331 Pregnant state, incidental: Secondary | ICD-10-CM

## 2013-06-29 DIAGNOSIS — N730 Acute parametritis and pelvic cellulitis: Secondary | ICD-10-CM

## 2013-06-29 LAB — POCT PREGNANCY, URINE: Preg Test, Ur: POSITIVE — AB

## 2013-06-29 MED ORDER — METRONIDAZOLE 500 MG PO TABS: 500.0000 mg | ORAL_TABLET | Freq: Two times a day (BID) | ORAL | Status: DC

## 2013-06-29 NOTE — Progress Notes (Signed)
Chloe Rodgers is an 22 y.o. female f/u for ED visit found to have possible PID found to have negative GC/CT at that time.  Given Rocephin/Azithro x 1 and tx with doxy which she is currently taking.  As well she is having some discharge still, did not take her Flagyl.  Found to be pregnant today and considering Elective abortion.    Pertinent Gynecological History: Contraception: none DES exposure: unknown Blood transfusions: none Sexually transmitted diseases: past history: Chlamydia and Gonorrhea and currently at risk OB History: G2, P2002   Menstrual History: Menarche age: 66  Patient's last menstrual period was 06/06/2013.    Past Medical History  Diagnosis Date  . Anemia   . Depression   . Complication of anesthesia     ??, seizure with wisdom teeth  . Urinary tract infection   . Psoriasis   . PID (acute pelvic inflammatory disease)   . Chlamydia   . PONV (postoperative nausea and vomiting)   . Seizures     July 2013 - [place]  . Headache(784.0)     otc med prn  . Anxiety   . Migraine     Past Surgical History  Procedure Laterality Date  . Cesarean section    . Skin graft      off abd, onto arm  . Wisdom tooth extraction    . Cesarean section  06/06/2012    Procedure: CESAREAN SECTION;  Surgeon: Adam Phenix, MD;  Location: [facility];  Service: Obstetrics;  Laterality: N/A;    Family History  Problem Relation Age of Onset  . Hypertension Mother   . Diabetes Mother   . Cancer Mother     BREAST  . Stroke Mother   . Seizures Mother   . Asthma Daughter   . Hypertension Maternal Grandmother   . Diabetes Maternal Grandmother   . Cancer Maternal Grandmother     BONE CANCER  . Other Neg Hx     Social History:  reports that she has never smoked. She has never used smokeless tobacco. She reports that she drinks alcohol. She reports that she does not use illicit drugs.  Allergies: No Known Allergies   (Not in a hospital admission)  ROS  Blood pressure  119/81, pulse 100, temperature 97.2 F (36.2 C), height 5\' 6"  (1.676 m), weight 201 lb 6.4 oz (91.354 kg), last menstrual period 06/06/2013. Physical Exam  Constitutional: She appears well-developed and well-nourished. No distress.  HENT:  Head: Normocephalic and atraumatic.  Eyes: EOM are normal.  Cardiovascular: Normal rate and regular rhythm.   Respiratory: Effort normal and breath sounds normal.  GI: Soft. Bowel sounds are normal. There is no tenderness.    Results for orders placed in visit on 06/29/13 (from the past 24 hour(s))  POCT PREGNANCY, URINE     Status: Abnormal   Collection Time    06/29/13  9:07 AM      Result Value Range   Preg Test, Ur POSITIVE (*) NEGATIVE   Assessment/Plan: 1) F/U for STD - Completed dosing of doxycycline for possible GC/Chlamydia, which were both negative.  Recommended stopping her doxycycline at this time, especially since her Upreg is positive. 2) Vaginitis - Will send in her Flagyl 500 mg BID x 7 days.  3) Positive Pregnancy Testing - She is considering elective abortion, however, will stop doxy at this time and recommend her to continue taking prenatal vitamins if she does change her mind.  U preg on 12/11 was negative.  F/U  PRN   Twana First Paulina Fusi, DO of [facility] 06/29/2013, 9:35 AM  Agree with resident note  Adam Phenix, MD

## 2013-06-29 NOTE — Progress Notes (Signed)
Patient her for follow up for PID , seen in [facility] ER.  Request pregnancy test because she states she feels like she is pregnant. Pregancy test positive. Patient states does not want this pregnancy. Wants abortion. Informed her we do not do abortions here in the clinic or in hospital and I cannot refer her.  Advised her to look in phone book yellow pages as there are places who do that.

## 2013-06-29 NOTE — Patient Instructions (Signed)
Bacterial Vaginosis Bacterial vaginosis (BV) is a vaginal infection where the normal balance of bacteria in the vagina is disrupted. The normal balance is then replaced by an overgrowth of certain bacteria. There are several different kinds of bacteria that can cause BV. BV is the most common vaginal infection in women of childbearing age. CAUSES   The cause of BV is not fully understood. BV develops when there is an increase or imbalance of harmful bacteria.  Some activities or behaviors can upset the normal balance of bacteria in the vagina and put women at increased risk including:  Having a new sex partner or multiple sex partners.  Douching.  Using an intrauterine device (IUD) for contraception.  It is not clear what role sexual activity plays in the development of BV. However, women that have never had sexual intercourse are rarely infected with BV. Women do not get BV from toilet seats, bedding, swimming pools or from touching objects around them.  SYMPTOMS   Grey vaginal discharge.  A fish-like odor with discharge, especially after sexual intercourse.  Itching or burning of the vagina and vulva.  Burning or pain with urination.  Some women have no signs or symptoms at all. DIAGNOSIS  Your caregiver must examine the vagina for signs of BV. Your caregiver will perform lab tests and look at the sample of vaginal fluid through a microscope. They will look for bacteria and abnormal cells (clue cells), a pH test higher than 4.5, and a positive amine test all associated with BV.  RISKS AND COMPLICATIONS   Pelvic inflammatory disease (PID).  Infections following gynecology surgery.  Developing HIV.  Developing herpes virus. TREATMENT  Sometimes BV will clear up without treatment. However, all women with symptoms of BV should be treated to avoid complications, especially if gynecology surgery is planned. Female partners generally do not need to be treated. However, BV may spread  between female sex partners so treatment is helpful in preventing a recurrence of BV.   BV may be treated with antibiotics. The antibiotics come in either pill or vaginal cream forms. Either can be used with nonpregnant or pregnant women, but the recommended dosages differ. These antibiotics are not harmful to the baby.  BV can recur after treatment. If this happens, a second round of antibiotics will often be prescribed.  Treatment is important for pregnant women. If not treated, BV can cause a premature delivery, especially for a pregnant woman who had a premature birth in the past. All pregnant women who have symptoms of BV should be checked and treated.  For chronic reoccurrence of BV, treatment with a type of prescribed gel vaginally twice a week is helpful. HOME CARE INSTRUCTIONS   Finish all medication as directed by your caregiver.  Do not have sex until treatment is completed.  Tell your sexual partner that you have a vaginal infection. They should see their caregiver and be treated if they have problems, such as a mild rash or itching.  Practice safe sex. Use condoms. Only have 1 sex partner. PREVENTION  Basic prevention steps can help reduce the risk of upsetting the natural balance of bacteria in the vagina and developing BV:  Do not have sexual intercourse (be abstinent).  Do not douche.  Use all of the medicine prescribed for treatment of BV, even if the signs and symptoms go away.  Tell your sex partner if you have BV. That way, they can be treated, if needed, to prevent reoccurrence. SEEK MEDICAL CARE IF:     Your symptoms are not improving after 3 days of treatment.  You have increased discharge, pain, or fever. MAKE SURE YOU:   Understand these instructions.  Will watch your condition.  Will get help right away if you are not doing well or get worse. FOR MORE INFORMATION  Division of STD Prevention (DSTDP), Centers for Disease Control and Prevention:  www.cdc.gov/std American Social Health Association (ASHA): www.ashastd.org  Document Released: 06/28/2005 Document Revised: 09/20/2011 Document Reviewed: 02/07/2013 Exit[employer] Patient Information 2014 [employer].  

## 2013-07-10 ENCOUNTER — Encounter: Payer: Self-pay | Admitting: *Deleted

## 2013-07-10 DIAGNOSIS — O99210 Obesity complicating pregnancy, unspecified trimester: Secondary | ICD-10-CM | POA: Insufficient documentation

## 2013-07-10 DIAGNOSIS — E669 Obesity, unspecified: Secondary | ICD-10-CM | POA: Insufficient documentation

## 2013-07-19 ENCOUNTER — Inpatient Hospital Stay (HOSPITAL_COMMUNITY): Payer: Medicaid Other

## 2013-07-19 ENCOUNTER — Inpatient Hospital Stay (HOSPITAL_COMMUNITY)
Admission: AD | Admit: 2013-07-19 | Discharge: 2013-07-19 | Disposition: A | Discharge status: Home / Self Care | Payer: Medicaid Other | Source: Ambulatory Visit | Attending: Obstetrics & Gynecology | Admitting: Obstetrics & Gynecology

## 2013-07-19 ENCOUNTER — Encounter (HOSPITAL_COMMUNITY): Payer: Self-pay | Admitting: *Deleted

## 2013-07-19 DIAGNOSIS — O210 Mild hyperemesis gravidarum: Secondary | ICD-10-CM | POA: Insufficient documentation

## 2013-07-19 DIAGNOSIS — R509 Fever, unspecified: Secondary | ICD-10-CM | POA: Insufficient documentation

## 2013-07-19 DIAGNOSIS — O9989 Other specified diseases and conditions complicating pregnancy, childbirth and the puerperium: Secondary | ICD-10-CM

## 2013-07-19 DIAGNOSIS — R109 Unspecified abdominal pain: Secondary | ICD-10-CM | POA: Insufficient documentation

## 2013-07-19 DIAGNOSIS — R112 Nausea with vomiting, unspecified: Secondary | ICD-10-CM

## 2013-07-19 DIAGNOSIS — O99891 Other specified diseases and conditions complicating pregnancy: Secondary | ICD-10-CM | POA: Insufficient documentation

## 2013-07-19 LAB — CBC
HCT: 34 % — ABNORMAL LOW (ref 36.0–46.0)
Hemoglobin: 11.6 g/dL — ABNORMAL LOW (ref 12.0–15.0)
MCH: 27.2 pg (ref 26.0–34.0)
MCHC: 34.1 g/dL (ref 30.0–36.0)
MCV: 79.6 fL (ref 78.0–100.0)
Platelets: 181 10*3/uL (ref 150–400)
RBC: 4.27 MIL/uL (ref 3.87–5.11)
RDW: 15.1 % (ref 11.5–15.5)
WBC: 8.3 10*3/uL (ref 4.0–10.5)

## 2013-07-19 LAB — URINALYSIS, ROUTINE W REFLEX MICROSCOPIC
Bilirubin Urine: NEGATIVE
Glucose, UA: NEGATIVE mg/dL
Hgb urine dipstick: NEGATIVE
Ketones, ur: NEGATIVE mg/dL
Leukocytes, UA: NEGATIVE
Nitrite: NEGATIVE
Protein, ur: NEGATIVE mg/dL
Specific Gravity, Urine: 1.025 (ref 1.005–1.030)
Urobilinogen, UA: 1 mg/dL (ref 0.0–1.0)
pH: 6.5 (ref 5.0–8.0)

## 2013-07-19 LAB — HCG, QUANTITATIVE, PREGNANCY: hCG, Beta Chain, Quant, S: 47532 m[IU]/mL — ABNORMAL HIGH (ref <5)

## 2013-07-19 LAB — ABO/RH: ABO/RH(D): A POS

## 2013-07-19 MED ORDER — ONDANSETRON 4 MG PO TBDP: 4.0000 mg | ORAL_TABLET | Freq: Once | ORAL | Status: DC

## 2013-07-19 MED ORDER — ONDANSETRON 8 MG PO TBDP
8.0000 mg | ORAL_TABLET | Freq: Once | ORAL | Status: AC
  Administered 2013-07-19: 8 mg via ORAL

## 2013-07-19 MED ORDER — OSELTAMIVIR PHOSPHATE 75 MG PO CAPS: 75.0000 mg | ORAL_CAPSULE | Freq: Two times a day (BID) | ORAL | Status: DC

## 2013-07-19 MED ORDER — ONDANSETRON 4 MG PO TBDP: 4.0000 mg | ORAL_TABLET | Freq: Three times a day (TID) | ORAL | Status: DC | PRN

## 2013-07-19 NOTE — MAU Provider Note (Signed)
First Provider Initiated Contact with Patient 07/19/13 1814      Chief Complaint:  Nausea, Abdominal Pain and Fever   Chloe Rodgers is  23 y.o. G3P2002 at 5348w0d presents complaining of Nausea, Abdominal Pain and Fever She states that she had a fever of 105.7 orally yesterday with chills and body aches.  Throat started getting sore yesterday.  States has only been able to keep down fluids since Tuesday. Had an episode of spotting 2 days ago.  [facility]  [facility]  Obstetrical/Gynecological History: OB History   Grav Para Term Preterm Abortions TAB SAB Ect Mult Living   3 2 2  0 0 0 0 0 0 2     Past Medical History: Past Medical History  Diagnosis Date  . Anemia   . Depression   . Complication of anesthesia     ??, seizure with wisdom teeth  . Urinary tract infection   . Psoriasis   . PID (acute pelvic inflammatory disease)   . Chlamydia   . PONV (postoperative nausea and vomiting)   . [place]     [place]  . Headache(784.0)     otc med prn  . Anxiety   . Migraine     Past Surgical History: Past Surgical History  Procedure Laterality Date  . Cesarean section    . Skin graft      off abd, onto arm  . Wisdom tooth extraction    . Cesarean section  06/06/2012    Procedure: CESAREAN SECTION;  Surgeon: Adam PhenixJames G Arnold, MD;  Location: [facility];  Service: Obstetrics;  Laterality: N/A;    Family History: Family History  Problem Relation Age of Onset  . Hypertension Mother   . Diabetes Mother   . Cancer Mother     BREAST  . Stroke Mother   . Seizures Mother   . Asthma Daughter   . Hypertension Maternal Grandmother   . Diabetes Maternal Grandmother   . Cancer Maternal Grandmother     BONE CANCER  . Other Neg Hx     Social History: History  Substance Use Topics  . Smoking status: Never Smoker   . Smokeless tobacco: Never Used  . Alcohol Use: Yes     Comment: socially but none with pregnancy    Allergies: No Known  Allergies  Meds:  Prescriptions prior to admission  Medication Sig Dispense Refill  . SUMAtriptan (IMITREX) 50 MG tablet Take 50 mg by mouth every 2 (two) hours as needed for migraine or headache. May repeat in 2 hours if headache persists or recurs.      . traMADol (ULTRAM) 50 MG tablet Take 50-100 mg by mouth every 6 (six) hours as needed for moderate pain.        Review of Systems   Constitutional: POSITIVE for fever and chills Eyes: Negative for visual disturbances Respiratory: Negative for shortness of breath, dyspnea Cardiovascular: Negative for chest pain or palpitations  Gastrointestinal: Negative for diarrhea  Genitourinary: Negative for dysuria and urgency Musculoskeletal: Negative for back pain, joint pain,POSITIVE  myalgias  Neurological: Negative for dizziness and headaches     Physical Exam  Blood pressure 112/65, pulse 82, temperature 98.7 F (37.1 C), temperature source Oral, resp. rate 18, height 5\' 6"  (1.676 m), weight 204 lb 6.4 oz (92.715 kg), last menstrual period 06/06/2013. GENERAL: Well-developed, well-nourished female in no acute distress.  No pharyngeal erythema LUNGS: Clear to auscultation bilaterally.  HEART: Regular rate  and rhythm. ABDOMEN: Soft, nontender, nondistended, gravid.  EXTREMITIES: Nontender, no edema, 2+ distal pulses. DTR's 2+    Labs: Results for orders placed during the hospital encounter of 07/19/13 (from the past 24 hour(s))  URINALYSIS, ROUTINE W REFLEX MICROSCOPIC   Collection Time    07/19/13  5:05 PM      Result Value Range   Color, Urine YELLOW  YELLOW   APPearance CLEAR  CLEAR   Specific Gravity, Urine 1.025  1.005 - 1.030   pH 6.5  5.0 - 8.0   Glucose, UA NEGATIVE  NEGATIVE mg/dL   Hgb urine dipstick NEGATIVE  NEGATIVE   Bilirubin Urine NEGATIVE  NEGATIVE   Ketones, ur NEGATIVE  NEGATIVE mg/dL   Protein, ur NEGATIVE  NEGATIVE mg/dL   Urobilinogen, UA 1.0  0.0 - 1.0 mg/dL   Nitrite NEGATIVE  NEGATIVE   Leukocytes,  UA NEGATIVE  NEGATIVE   Imaging Studies:  US Ob Comp Less 14 Wks  07/19/2013   CLINICAL DATA:  Spotting, fever, unknown LMP  EXAM: OBSTETRIC <14 WK ULTRASOUND  TECHNIQUE: Transabdominal ultrasound was performed for evaluation of the gestation as well as the maternal uterus and adnexal regions.  COMPARISON:  None.  FINDINGS: Intrauterine gestational sac: Visualized/normal in shape.  Yolk sac:  Present  Embryo:  Single  Cardiac Activity: Present.  Heart Rate: 143 bpm  CRL:   9.2  mm   7 w 0 d                  [place] EDC: 03/07/2014  Maternal uterus/adnexae: A small 8.6 mm subchorionic hemorrhage is identified.  IMPRESSION: 1. Single viable intrauterine pregnancy 2. Small subchorionic hemorrhage surveillance evaluation recommended.   Electronically Signed   By: Salome Holmes M.D.   On: 07/19/2013 17:38    Assessment: Chloe Rodgers is  23 y.o. G3P2002 at [redacted]w[redacted]d presents with Nausea and vomiting, not dehydrated Flu-like symptoms SCH, small.  Plan: Zofran PCR flu testing Tamiflu TO make appt with [facility] to [facility]  CRESENZO-DISHMAN,Damaris Geers 1/8/20156:26 PM

## 2013-07-19 NOTE — MAU Provider Note (Signed)
Attestation of Attending Supervision of Advanced Practitioner (CNM/NP): Evaluation and management procedures were performed by the Advanced Practitioner under my supervision and collaboration.  I have reviewed the Advanced Practitioner's note and chart, and I agree with the management and plan.  HARRAWAY-SMITH, Zakry Caso 10:45 PM     

## 2013-07-19 NOTE — MAU Note (Signed)
Pt reports she has been having nausea, poor appetite, abd cramping and spotting . Stated she had a fever last night of 105.7 . No fever today.

## 2013-07-19 NOTE — MAU Note (Addendum)
PT SAYS  SHE HAD FEVER  YESTERDAY- NONE TODAY.  HAS CHILLS TODAY,   ON Tuesday SHE ATE [employer] ON Tuesday .  HAS  SORE THROAT- STARTED ON Tuesday.  DENIES DIARRHEA.   SAYS  FEELS BAD TODAY.

## 2013-07-19 NOTE — Discharge Instructions (Signed)

## 2013-07-20 LAB — INFLUENZA PANEL BY PCR (TYPE A & B)
H1N1 flu by pcr: NOT DETECTED
Influenza A By PCR: NEGATIVE
Influenza B By PCR: NEGATIVE

## 2013-08-06 ENCOUNTER — Encounter (HOSPITAL_COMMUNITY): Payer: Self-pay | Admitting: Emergency Medicine

## 2013-08-06 ENCOUNTER — Emergency Department (HOSPITAL_COMMUNITY)
Admission: EM | Admit: 2013-08-06 | Discharge: 2013-08-06 | Disposition: A | Discharge status: Home / Self Care | Payer: Medicaid Other | Attending: Emergency Medicine | Admitting: Emergency Medicine

## 2013-08-06 ENCOUNTER — Emergency Department (HOSPITAL_COMMUNITY): Payer: Medicaid Other

## 2013-08-06 DIAGNOSIS — Z872 Personal history of diseases of the skin and subcutaneous tissue: Secondary | ICD-10-CM | POA: Insufficient documentation

## 2013-08-06 DIAGNOSIS — Z862 Personal history of diseases of the blood and blood-forming organs and certain disorders involving the immune mechanism: Secondary | ICD-10-CM | POA: Insufficient documentation

## 2013-08-06 DIAGNOSIS — R1110 Vomiting, unspecified: Secondary | ICD-10-CM | POA: Insufficient documentation

## 2013-08-06 DIAGNOSIS — G40909 Epilepsy, unspecified, not intractable, without status epilepticus: Secondary | ICD-10-CM | POA: Insufficient documentation

## 2013-08-06 DIAGNOSIS — R1032 Left lower quadrant pain: Secondary | ICD-10-CM | POA: Insufficient documentation

## 2013-08-06 DIAGNOSIS — R197 Diarrhea, unspecified: Secondary | ICD-10-CM | POA: Insufficient documentation

## 2013-08-06 DIAGNOSIS — Z8659 Personal history of other mental and behavioral disorders: Secondary | ICD-10-CM | POA: Insufficient documentation

## 2013-08-06 DIAGNOSIS — R109 Unspecified abdominal pain: Secondary | ICD-10-CM

## 2013-08-06 DIAGNOSIS — Z8679 Personal history of other diseases of the circulatory system: Secondary | ICD-10-CM | POA: Insufficient documentation

## 2013-08-06 DIAGNOSIS — Z8742 Personal history of other diseases of the female genital tract: Secondary | ICD-10-CM | POA: Insufficient documentation

## 2013-08-06 DIAGNOSIS — Z87440 Personal history of urinary (tract) infections: Secondary | ICD-10-CM | POA: Insufficient documentation

## 2013-08-06 DIAGNOSIS — G8918 Other acute postprocedural pain: Secondary | ICD-10-CM | POA: Insufficient documentation

## 2013-08-06 DIAGNOSIS — Z8619 Personal history of other infectious and parasitic diseases: Secondary | ICD-10-CM | POA: Insufficient documentation

## 2013-08-06 DIAGNOSIS — R569 Unspecified convulsions: Secondary | ICD-10-CM

## 2013-08-06 LAB — URINALYSIS, ROUTINE W REFLEX MICROSCOPIC
Bilirubin Urine: NEGATIVE
Glucose, UA: NEGATIVE mg/dL
Ketones, ur: NEGATIVE mg/dL
Leukocytes, UA: NEGATIVE
Nitrite: NEGATIVE
Protein, ur: NEGATIVE mg/dL
Specific Gravity, Urine: 1.028 (ref 1.005–1.030)
Urobilinogen, UA: 1 mg/dL (ref 0.0–1.0)
pH: 6.5 (ref 5.0–8.0)

## 2013-08-06 LAB — CBC WITH DIFFERENTIAL/PLATELET
Basophils Absolute: 0 10*3/uL (ref 0.0–0.1)
Basophils Relative: 0 % (ref 0–1)
Eosinophils Absolute: 0 10*3/uL (ref 0.0–0.7)
Eosinophils Relative: 0 % (ref 0–5)
HCT: 34.7 % — ABNORMAL LOW (ref 36.0–46.0)
Hemoglobin: 11.7 g/dL — ABNORMAL LOW (ref 12.0–15.0)
Lymphocytes Relative: 11 % — ABNORMAL LOW (ref 12–46)
Lymphs Abs: 0.6 10*3/uL — ABNORMAL LOW (ref 0.7–4.0)
MCH: 27.9 pg (ref 26.0–34.0)
MCHC: 33.7 g/dL (ref 30.0–36.0)
MCV: 82.6 fL (ref 78.0–100.0)
Monocytes Absolute: 0.3 10*3/uL (ref 0.1–1.0)
Monocytes Relative: 5 % (ref 3–12)
Neutro Abs: 4.2 10*3/uL (ref 1.7–7.7)
Neutrophils Relative %: 83 % — ABNORMAL HIGH (ref 43–77)
Platelets: 132 10*3/uL — ABNORMAL LOW (ref 150–400)
RBC: 4.2 MIL/uL (ref 3.87–5.11)
RDW: 15.3 % (ref 11.5–15.5)
WBC: 5 10*3/uL (ref 4.0–10.5)

## 2013-08-06 LAB — COMPREHENSIVE METABOLIC PANEL
ALT: 24 U/L (ref 0–35)
AST: 14 U/L (ref 0–37)
Albumin: 3.3 g/dL — ABNORMAL LOW (ref 3.5–5.2)
Alkaline Phosphatase: 61 U/L (ref 39–117)
BUN: 12 mg/dL (ref 6–23)
CO2: 23 mEq/L (ref 19–32)
Calcium: 8.2 mg/dL — ABNORMAL LOW (ref 8.4–10.5)
Chloride: 104 mEq/L (ref 96–112)
Creatinine, Ser: 0.7 mg/dL (ref 0.50–1.10)
GFR calc Af Amer: >90 mL/min (ref >90)
GFR calc non Af Amer: >90 mL/min (ref >90)
Glucose, Bld: 94 mg/dL (ref 70–99)
Potassium: 3.8 mEq/L (ref 3.7–5.3)
Sodium: 139 mEq/L (ref 137–147)
Total Bilirubin: 0.2 mg/dL — ABNORMAL LOW (ref 0.3–1.2)
Total Protein: 7.1 g/dL (ref 6.0–8.3)

## 2013-08-06 LAB — URINE MICROSCOPIC-ADD ON

## 2013-08-06 LAB — POCT PREGNANCY, URINE: Preg Test, Ur: POSITIVE — AB

## 2013-08-06 MED ORDER — ONDANSETRON 4 MG PO TBDP
8.0000 mg | ORAL_TABLET | Freq: Once | ORAL | Status: AC
  Administered 2013-08-06: 8 mg via ORAL
  Filled 2013-08-06: qty 2

## 2013-08-06 MED ORDER — SODIUM CHLORIDE 0.9 % IV BOLUS (SEPSIS)
1000.0000 mL | Freq: Once | INTRAVENOUS | Status: DC
Start: 2013-08-06 — End: 2013-08-06

## 2013-08-06 MED ORDER — MORPHINE SULFATE 4 MG/ML IJ SOLN
8.0000 mg | Freq: Once | INTRAMUSCULAR | Status: DC
  Filled 2013-08-06: qty 2

## 2013-08-06 MED ORDER — OXYCODONE-ACETAMINOPHEN 5-325 MG PO TABS: 1.0000 | ORAL_TABLET | ORAL | Status: DC | PRN

## 2013-08-06 MED ORDER — ONDANSETRON HCL 4 MG/2ML IJ SOLN
4.0000 mg | Freq: Once | INTRAMUSCULAR | Status: DC
  Filled 2013-08-06: qty 2

## 2013-08-06 MED ORDER — MORPHINE SULFATE 4 MG/ML IJ SOLN
4.0000 mg | Freq: Once | INTRAMUSCULAR | Status: AC
  Administered 2013-08-06: 4 mg via INTRAMUSCULAR
  Filled 2013-08-06: qty 1

## 2013-08-06 MED ORDER — FENTANYL CITRATE 0.05 MG/ML IJ SOLN
100.0000 ug | Freq: Once | INTRAMUSCULAR | Status: DC
  Filled 2013-08-06: qty 2

## 2013-08-06 MED ORDER — LORAZEPAM 2 MG/ML IJ SOLN
1.0000 mg | Freq: Once | INTRAMUSCULAR | Status: AC
  Administered 2013-08-06: 1 mg via INTRAMUSCULAR
  Filled 2013-08-06: qty 1

## 2013-08-06 NOTE — ED Notes (Signed)
2 RN's attempted IV start. MD updated.

## 2013-08-06 NOTE — ED Notes (Signed)
NOTIFIED DR. Bebe ShaggyWICKLINE IN PERSON OF PATIENTS LAB RESULTS OF POCT/ URINE PREGNANCY ,08/06/2013.

## 2013-08-06 NOTE — ED Notes (Signed)
CT notified of POCT preg- okay by MD to do CT

## 2013-08-06 NOTE — ED Notes (Signed)
Pt had seizure like activity (full body) for approx 10 seconds. No post ictal phase.

## 2013-08-06 NOTE — Discharge Instructions (Signed)
Please be aware you may have another seizure  Do not drive until seen by your physician for your condition  Do not climb ladders/roofs/trees as a seizure can occur at that height and cause serious harm  Do not bathe/swim alone as a seizure can occur and cause serious harm  Please followup with your physician or neurologist for further testing and possible treatment   Abdominal (belly) pain can be caused by many things. any cases can be observed and treated at home after initial evaluation in the emergency department. Even though you are being discharged home, abdominal pain can be unpredictable. Therefore, you need a repeated exam if your pain does not resolve, returns, or worsens. Most patients with abdominal pain don't have to be admitted to the hospital or have surgery, but serious problems like appendicitis and gallbladder attacks can start out as nonspecific pain. Many abdominal conditions cannot be diagnosed in one visit, so follow-up evaluations are very important. SEEK IMMEDIATE MEDICAL ATTENTION IF: The pain does not go away or becomes severe, particularly over the next 8-12 hours.  A temperature above 100.92F develops.  Repeated vomiting occurs (multiple episodes).  The pain becomes localized to portions of the abdomen. The right side could possibly be appendicitis. In an adult, the left lower portion of the abdomen could be colitis or diverticulitis.  Blood is being passed in stools or vomit (bright red or black tarry stools).  Return also if you develop chest pain, difficulty breathing, dizziness or fainting, or become confused, poorly responsive

## 2013-08-06 NOTE — ED Provider Notes (Signed)
CSN: [id number]     Arrival date & time 08/06/13  1138 History   First MD Initiated Contact with Patient 08/06/13 1625     Chief Complaint  Patient presents with  . Abdominal Pain    Patient is a 23 y.o. female presenting with abdominal pain. The history is provided by the patient.  Abdominal Pain Pain location:  L flank, LUQ and LLQ Pain quality: aching   Pain severity:  Moderate Onset quality:  Gradual Duration:  2 days Timing:  Constant Progression:  Worsening Relieved by:  Nothing Worsened by:  Palpation and movement Associated symptoms: diarrhea and vomiting   Associated symptoms: no chest pain and no fever   pt reports she had elective abortion 4 days ago (she was [redacted] week pregnant) And now for past two days has had pain in left flank/abdomen She reports her vaginal bleeding has improved No fever.  She has had vomiting/diarrhea No dysuria is reported  Past Medical History  Diagnosis Date  . Anemia   . Depression   . Complication of anesthesia     ??, seizure with wisdom teeth  . Urinary tract infection   . Psoriasis   . PID (acute pelvic inflammatory disease)   . Chlamydia   . PONV (postoperative nausea and vomiting)   . Seizures     July [place]  . Headache(784.0)     otc med prn  . Anxiety   . Migraine    Past Surgical History  Procedure Laterality Date  . Cesarean section    . Skin graft      off abd, onto arm  . Wisdom tooth extraction    . Cesarean section  06/06/2012    Procedure: CESAREAN SECTION;  Surgeon: Adam PhenixJames G Arnold, MD;  Location: [facility];  Service: Obstetrics;  Laterality: N/A;   Family History  Problem Relation Age of Onset  . Hypertension Mother   . Diabetes Mother   . Cancer Mother     BREAST  . Stroke Mother   . Seizures Mother   . Asthma Daughter   . Hypertension Maternal Grandmother   . Diabetes Maternal Grandmother   . Cancer Maternal Grandmother     BONE CANCER  . Other Neg Hx    History  Substance Use Topics   . Smoking status: Never Smoker   . Smokeless tobacco: Never Used  . Alcohol Use: Yes     Comment: socially but none with pregnancy   OB History   Grav Para Term Preterm Abortions TAB SAB Ect Mult Living   3 2 2  0 0 0 0 0 0 2     Review of Systems  Constitutional: Negative for fever.  Cardiovascular: Negative for chest pain.  Gastrointestinal: Positive for vomiting, abdominal pain and diarrhea.  All other systems reviewed and are negative.    Allergies  Review of patient's allergies indicates no known allergies.  Home Medications   Current Outpatient Rx  Name  Route  Sig  Dispense  Refill  . ondansetron (ZOFRAN-ODT) 4 MG disintegrating tablet   Oral   Take 1 tablet (4 mg total) by mouth every 8 (eight) hours as needed for nausea or vomiting.   20 tablet   3    BP 125/76  Pulse 100  Temp(Src) 98.6 F (37 C) (Oral)  Resp 16  SpO2 100%  LMP 06/06/2013 Physical Exam CONSTITUTIONAL: Well developed/well nourished HEAD: Normocephalic/atraumatic EYES: EOMI/PERRL ENMT: Mucous membranes moist NECK: supple no meningeal signs SPINE:entire spine  nontender CV: S1/S2 noted, no murmurs/rubs/gallops noted LUNGS: Lungs are clear to auscultation bilaterally, no apparent distress ABDOMEN: soft, mild tenderness to LUQ, no rebound or guarding YT:KZSW cva tenderness, small amt of blood noted in vault, os appear closed, mild CMT noted, no products of conception noted, female chaperone present NEURO: Pt is awake/alert, moves all extremitiesx4 EXTREMITIES: pulses normal, full ROM SKIN: warm, color normal PSYCH: no abnormalities of mood noted  ED Course  Procedures (including critical care time) 5:15 PM Pt is s/p elective abortion 4 days ago now with flank/abd pain and also vomiting/diarrhea (multiple episodes) Will need pelvic exam before proceeding with imaging 5:41 PM Pt with minimal bleeding.   Her abdominal exam is benign.  Most of her pain is in left flank D/w on call OB,  if no fever and no elevated WBC and minimal bleeding, unlikely retained POC She does have h/o kidney stones, this could be culprit of flank pain 6:43 PM Pt with seizure in room (reported h/o seizures) By the time of my eval, it had stopped and is now drowsy Pt on seizure precautions 8:02 PM Discussed CT findings with patient She feels improved, and she is in no distress and she is back to baseline She is not currently on AED therapy for seizures Advised neurology followup We discussed seizure precautions Labs Review Labs Reviewed  CBC WITH DIFFERENTIAL - Abnormal; Notable for the following:    Hemoglobin 11.7 (*)    HCT 34.7 (*)    Platelets 132 (*)    Neutrophils Relative % 83 (*)    Lymphocytes Relative 11 (*)    Lymphs Abs 0.6 (*)    All other components within normal limits  COMPREHENSIVE METABOLIC PANEL - Abnormal; Notable for the following:    Calcium 8.2 (*)    Albumin 3.3 (*)    Total Bilirubin 0.2 (*)    All other components within normal limits  POCT PREGNANCY, URINE - Abnormal; Notable for the following:    Preg Test, Ur POSITIVE (*)    All other components within normal limits  URINALYSIS, ROUTINE W REFLEX MICROSCOPIC   Imaging Review No results found.  EKG Interpretation   None       MDM  No diagnosis found. Nursing notes including past medical history and social history reviewed and considered in documentation Labs/vital reviewed and considered     Joya Gaskins, MD 08/06/13 2004

## 2013-08-06 NOTE — ED Notes (Signed)
Had abortion x 4 days ago and now having abd pain  Has been going thru 5 pads a day  Nausea  She states 130/78 h rate 104 cbg 93

## 2013-09-30 ENCOUNTER — Encounter (HOSPITAL_COMMUNITY): Payer: Self-pay | Admitting: Emergency Medicine

## 2013-09-30 ENCOUNTER — Emergency Department (HOSPITAL_COMMUNITY)
Admission: EM | Admit: 2013-09-30 | Discharge: 2013-09-30 | Disposition: A | Discharge status: Home / Self Care | Payer: Medicaid Other | Attending: Emergency Medicine | Admitting: Emergency Medicine

## 2013-09-30 ENCOUNTER — Emergency Department (HOSPITAL_COMMUNITY): Payer: Medicaid Other

## 2013-09-30 DIAGNOSIS — R42 Dizziness and giddiness: Secondary | ICD-10-CM | POA: Insufficient documentation

## 2013-09-30 DIAGNOSIS — Z862 Personal history of diseases of the blood and blood-forming organs and certain disorders involving the immune mechanism: Secondary | ICD-10-CM | POA: Insufficient documentation

## 2013-09-30 DIAGNOSIS — R519 Headache, unspecified: Secondary | ICD-10-CM

## 2013-09-30 DIAGNOSIS — Z8679 Personal history of other diseases of the circulatory system: Secondary | ICD-10-CM | POA: Insufficient documentation

## 2013-09-30 DIAGNOSIS — G8929 Other chronic pain: Secondary | ICD-10-CM | POA: Insufficient documentation

## 2013-09-30 DIAGNOSIS — Z8659 Personal history of other mental and behavioral disorders: Secondary | ICD-10-CM | POA: Insufficient documentation

## 2013-09-30 DIAGNOSIS — N898 Other specified noninflammatory disorders of vagina: Secondary | ICD-10-CM | POA: Insufficient documentation

## 2013-09-30 DIAGNOSIS — Z87891 Personal history of nicotine dependence: Secondary | ICD-10-CM | POA: Insufficient documentation

## 2013-09-30 DIAGNOSIS — R51 Headache: Secondary | ICD-10-CM | POA: Insufficient documentation

## 2013-09-30 DIAGNOSIS — R109 Unspecified abdominal pain: Secondary | ICD-10-CM | POA: Insufficient documentation

## 2013-09-30 DIAGNOSIS — Z8669 Personal history of other diseases of the nervous system and sense organs: Secondary | ICD-10-CM | POA: Insufficient documentation

## 2013-09-30 DIAGNOSIS — Z8619 Personal history of other infectious and parasitic diseases: Secondary | ICD-10-CM | POA: Insufficient documentation

## 2013-09-30 DIAGNOSIS — Z3202 Encounter for pregnancy test, result negative: Secondary | ICD-10-CM | POA: Insufficient documentation

## 2013-09-30 DIAGNOSIS — Z87440 Personal history of urinary (tract) infections: Secondary | ICD-10-CM | POA: Insufficient documentation

## 2013-09-30 DIAGNOSIS — R55 Syncope and collapse: Secondary | ICD-10-CM | POA: Insufficient documentation

## 2013-09-30 DIAGNOSIS — Z872 Personal history of diseases of the skin and subcutaneous tissue: Secondary | ICD-10-CM | POA: Insufficient documentation

## 2013-09-30 LAB — CBC WITH DIFFERENTIAL/PLATELET
Basophils Absolute: 0 10*3/uL (ref 0.0–0.1)
Basophils Relative: 1 % (ref 0–1)
Eosinophils Absolute: 0.1 10*3/uL (ref 0.0–0.7)
Eosinophils Relative: 1 % (ref 0–5)
HCT: 38.7 % (ref 36.0–46.0)
Hemoglobin: 13.3 g/dL (ref 12.0–15.0)
Lymphocytes Relative: 24 % (ref 12–46)
Lymphs Abs: 1.5 10*3/uL (ref 0.7–4.0)
MCH: 28.3 pg (ref 26.0–34.0)
MCHC: 34.4 g/dL (ref 30.0–36.0)
MCV: 82.3 fL (ref 78.0–100.0)
Monocytes Absolute: 0.6 10*3/uL (ref 0.1–1.0)
Monocytes Relative: 9 % (ref 3–12)
Neutro Abs: 4 10*3/uL (ref 1.7–7.7)
Neutrophils Relative %: 65 % (ref 43–77)
Platelets: 173 10*3/uL (ref 150–400)
RBC: 4.7 MIL/uL (ref 3.87–5.11)
RDW: 13.3 % (ref 11.5–15.5)
WBC: 6.1 10*3/uL (ref 4.0–10.5)

## 2013-09-30 LAB — BASIC METABOLIC PANEL
BUN: 16 mg/dL (ref 6–23)
CO2: 24 mEq/L (ref 19–32)
Calcium: 9.3 mg/dL (ref 8.4–10.5)
Chloride: 100 mEq/L (ref 96–112)
Creatinine, Ser: 0.76 mg/dL (ref 0.50–1.10)
GFR calc Af Amer: >90 mL/min (ref >90)
GFR calc non Af Amer: >90 mL/min (ref >90)
Glucose, Bld: 94 mg/dL (ref 70–99)
Potassium: 3.8 mEq/L (ref 3.7–5.3)
Sodium: 137 mEq/L (ref 137–147)

## 2013-09-30 LAB — PREGNANCY, URINE: Preg Test, Ur: NEGATIVE

## 2013-09-30 MED ORDER — DIPHENHYDRAMINE HCL 50 MG/ML IJ SOLN
25.0000 mg | Freq: Once | INTRAMUSCULAR | Status: AC
  Administered 2013-09-30: 25 mg via INTRAVENOUS
  Filled 2013-09-30: qty 1

## 2013-09-30 MED ORDER — PROCHLORPERAZINE EDISYLATE 5 MG/ML IJ SOLN
10.0000 mg | Freq: Once | INTRAMUSCULAR | Status: AC
  Administered 2013-09-30: 10 mg via INTRAVENOUS
  Filled 2013-09-30: qty 2

## 2013-09-30 NOTE — ED Notes (Signed)
Pt now states she has numbness and unable to move left side of body, but could lift hips for bedpan

## 2013-09-30 NOTE — ED Notes (Signed)
Reported thrashing episode to [place].  Pt came around right after.

## 2013-09-30 NOTE — ED Notes (Signed)
Pt is moving all extremities times four now and states headache is better.

## 2013-09-30 NOTE — ED Provider Notes (Signed)
CSN: [id number]     Arrival date & time 09/30/13  0919 History   First MD Initiated Contact with Patient 09/30/13 0920     Chief Complaint  Patient presents with  . Loss of Consciousness     (Consider location/radiation/quality/duration/timing/severity/associated sxs/prior Treatment) The history is provided by the patient.    Patient with hx complicated migraine and seizures presents following syncopal episode.  Pt states she has had multiple similar episodes, 4 x last year and 2x this year.  They start with sensation of dizziness/lightheadedness and then syncope - she states she does not remember anything that happened following her dizziness.  Has daily headaches x years, not worse or different today.  Also daily crampy lower abdominal pain that waxes and wanes but never goes away, currently on the left side.  Has passed vaginal blood clots today and 2 weeks ago.  Denies other abnormal vaginal discharge.  Denies leg swelling, recent immobilization, exogenous estrogen, hx blood clots.  Denies ETOH or drug use.  Denies any injury during episode today, denies tongue biting or incontinence.    Past Medical History  Diagnosis Date  . Anemia   . Depression   . Complication of anesthesia     ??, seizure with wisdom teeth  . Urinary tract infection   . Psoriasis   . PID (acute pelvic inflammatory disease)   . Chlamydia   . PONV (postoperative nausea and vomiting)   . [place]     [place]  . Headache(784.0)     otc med prn  . Anxiety   . Migraine    Past Surgical History  Procedure Laterality Date  . Cesarean section    . Skin graft      off abd, onto arm  . Wisdom tooth extraction    . Cesarean section  06/06/2012    Procedure: CESAREAN SECTION;  Surgeon: Adam PhenixJames G Arnold, MD;  Location: [facility];  Service: Obstetrics;  Laterality: N/A;   Family History  Problem Relation Age of Onset  . Hypertension Mother   . Diabetes Mother   . Cancer Mother     BREAST  .  Stroke Mother   . Seizures Mother   . Asthma Daughter   . Hypertension Maternal Grandmother   . Diabetes Maternal Grandmother   . Cancer Maternal Grandmother     BONE CANCER  . Other Neg Hx    History  Substance Use Topics  . Smoking status: Former Games developermoker  . Smokeless tobacco: Never Used  . Alcohol Use: Yes     Comment: socially but none with pregnancy   OB History   Grav Para Term Preterm Abortions TAB SAB Ect Mult Living   3 2 2  0 0 0 0 0 0 2     Review of Systems  Constitutional: Negative for fever.  Respiratory: Negative for cough and shortness of breath.   Cardiovascular: Negative for chest pain and leg swelling.  Gastrointestinal: Positive for abdominal pain. Negative for nausea, vomiting and diarrhea.  Genitourinary: Positive for vaginal bleeding. Negative for dysuria, urgency, frequency and vaginal discharge.  Musculoskeletal: Negative for neck stiffness.  Neurological: Positive for syncope and headaches.  All other systems reviewed and are negative.      Allergies  Review of patient's allergies indicates no known allergies.  Home Medications   Current Outpatient Rx  Name  Route  Sig  Dispense  Refill  . ondansetron (ZOFRAN-ODT) 4 MG disintegrating tablet   Oral   Take 1  tablet (4 mg total) by mouth every 8 (eight) hours as needed for nausea or vomiting.   20 tablet   3   . oxyCODONE-acetaminophen (PERCOCET/ROXICET) 5-325 MG per tablet   Oral   Take 1 tablet by mouth every 4 (four) hours as needed for severe pain.   5 tablet   0    BP 129/81  Pulse 95  Temp(Src) 98.1 F (36.7 C) (Oral)  Resp 18  Wt 204 lb (92.534 kg)  SpO2 100%  LMP 09/01/2013  Breastfeeding? Unknown Physical Exam  Nursing note and vitals reviewed. Constitutional: She appears well-developed and well-nourished. No distress.  HENT:  Head: Normocephalic and atraumatic.  Neck: Neck supple.  Cardiovascular: Normal rate and regular rhythm.   Pulmonary/Chest: Effort normal and  breath sounds normal. No respiratory distress. She has no wheezes. She has no rales.  Abdominal: Soft. She exhibits no distension and no mass. There is tenderness. There is no rebound and no guarding.  Diffuse tenderness across lower abdomen  Neurological: She is alert. GCS eye subscore is 4. GCS verbal subscore is 5. GCS motor subscore is 6.  CN II-XII intact, EOMs intact.  Pt has full strength and sensation in right upper and lower extremities.  Inconsistent testing with weakness of left   Skin: She is not diaphoretic.    ED Course  Procedures (including critical care time) Labs Review Labs Reviewed  CBC WITH DIFFERENTIAL  BASIC METABOLIC PANEL  PREGNANCY, URINE   Imaging Review Ct Head Wo Contrast  09/30/2013   CLINICAL DATA:  Headache.  Dizziness and loss of consciousness.  EXAM: CT HEAD WITHOUT CONTRAST  TECHNIQUE: Contiguous axial images were obtained from the base of the skull through the vertex without intravenous contrast.  COMPARISON:  None.  FINDINGS: The brain has a normal appearance without evidence of malformation, atrophy, old or acute infarction, mass lesion, hemorrhage, hydrocephalus or extra-axial collection. The calvarium appears normal. Visualized sinuses, middle ears and mastoids are clear.  IMPRESSION: Normal examination.   Electronically Signed   By: Paulina Fusi M.D.   On: 09/30/2013 10:30     EKG Interpretation   Date/Time:  Sunday September 30 2013 09:28:35 EDT Ventricular Rate:  87 PR Interval:  140 QRS Duration: 87 QT Interval:  364 QTC Calculation: 438 R Axis:   64 Text Interpretation:  Sinus rhythm No significant change was found  Confirmed by CAMPOS  MD, Caryn Bee ([id number]) on 09/30/2013 9:33:03 AM     9:40 AM Discussed pt with Dr Patria Mane who will also see the patient.   11:07 AM Patient's headache has improved with compazine, benadryl.  Neurologically intact.   Strength 5/5 in all extremities.  Finger to nose, heel to shin, rapid alternating movements normal.    Filed Vitals:   09/30/13 1109  BP: 107/65  Pulse: 85  Temp: 97.1 F (36.2 C)  Resp: 18     MDM   Final diagnoses:  Chronic daily headache  Syncope    Pt presents following syncopal episode today.  She has daily headache and chronic abdominal pain - neither of these have changed in quality or severity.  Pt reports multiple syncopal episodes over the past two years - all preceded lightheadedness/dizziness.  EKG, cardiac monitoring without noted abnormality.  Pt began complaining of left sided weakness while I was examining her, states she hasn't noticed it prior to this - her exam was somewhat inconsistent and she was also immediately examined by Dr Patria Mane whose exam also showed inconsistent weakness.  After treatment of patient's migraine, she had 5/5 strength throughout and headache was improved.  Given daily headaches, patient referred to neurology.  Pt notes she was passing vaginal clots today but is due to start her period.  Her BP and HR are normal, as is her Hgb.  She is not pregnant.  Her chronic abdominal pain is unchanged.  Pt d/c home with neurology, PCP follow up.  Discussed result, findings, treatment, and follow up  with patient.  Pt given return precautions.  Pt verbalizes understanding and agrees with plan.        [place], PA-C 09/30/13 1313

## 2013-09-30 NOTE — ED Notes (Signed)
Pt has seizure pads to bed, PA aware of the patient stating unable to move left side of body.  Medication administered and pt ready to got to CT

## 2013-09-30 NOTE — ED Notes (Signed)
Pt was having migraines, abdominal pain from ovarian cyst, pt got dizzy and passed out.  BP:  160/100, CBG: 140.  Pt is passing vaginal clots, LMP was last month 21st.  Found her on the floor.

## 2013-09-30 NOTE — Discharge Instructions (Signed)
Read the information below.  You may return to the Emergency Department at any time for worsening condition or any new symptoms that concern you.  Please follow up with your primary care provider and the neurology office above. If you develop fevers, uncontrolled pain, weakness or numbness in your arms or legs, difficulty speaking or walking, return to the ER immediately for a recheck.   You are having a headache. No specific cause was found today for your headache. It may have been a migraine or other cause of headache. Stress, anxiety, fatigue, and depression are common triggers for headaches. Your headache today does not appear to be life-threatening or require hospitalization, but often the exact cause of headaches is not determined in the emergency department. Therefore, follow-up with your doctor is very important to find out what may have caused your headache, and whether or not you need any further diagnostic testing or treatment. Sometimes headaches can appear benign (not harmful), but then more serious symptoms can develop which should prompt an immediate re-evaluation by your doctor or the emergency department. SEEK MEDICAL ATTENTION IF: You develop possible problems with medications prescribed.  The medications don't resolve your headache, if it recurs , or if you have multiple episodes of vomiting or can't take fluids. You have a change from the usual headache. RETURN IMMEDIATELY IF you develop a sudden, severe headache or confusion, become poorly responsive or faint, develop a fever above 100.52F or problem breathing, have a change in speech, vision, swallowing, or understanding, or develop new weakness, numbness, tingling, incoordination, or have a seizure.   Syncope Syncope is a fainting spell. This means the person loses consciousness and drops to the ground. The person is generally unconscious for less than 5 minutes. The person may have some muscle twitches for up to 15 seconds before  waking up and returning to normal. Syncope occurs more often in elderly people, but it can happen to anyone. While most causes of syncope are not dangerous, syncope can be a sign of a serious medical problem. It is important to seek medical care.  CAUSES  Syncope is caused by a sudden decrease in blood flow to the brain. The specific cause is often not determined. Factors that can trigger syncope include:  Taking medicines that lower blood pressure.  Sudden changes in posture, such as standing up suddenly.  Taking more medicine than prescribed.  Standing in one place for too long.  Seizure disorders.  Dehydration and excessive exposure to heat.  Low blood sugar (hypoglycemia).  Straining to have a bowel movement.  Heart disease, irregular heartbeat, or other circulatory problems.  Fear, emotional distress, seeing blood, or severe pain. SYMPTOMS  Right before fainting, you may:  Feel dizzy or lightheaded.  Feel nauseous.  See all white or all black in your field of vision.  Have cold, clammy skin. DIAGNOSIS  Your caregiver will ask about your symptoms, perform a physical exam, and perform electrocardiography (ECG) to record the electrical activity of your heart. Your caregiver may also perform other heart or blood tests to determine the cause of your syncope. TREATMENT  In most cases, no treatment is needed. Depending on the cause of your syncope, your caregiver may recommend changing or stopping some of your medicines. HOME CARE INSTRUCTIONS  Have someone stay with you until you feel stable.  Do not drive, operate machinery, or play sports until your caregiver says it is okay.  Keep all follow-up appointments as directed by your caregiver.  Lie down  right away if you start feeling like you might faint. Breathe deeply and steadily. Wait until all the symptoms have passed.  Drink enough fluids to keep your urine clear or pale yellow.  If you are taking blood pressure or  heart medicine, get up slowly, taking several minutes to sit and then stand. This can reduce dizziness. SEEK IMMEDIATE MEDICAL CARE IF:   You have a severe headache.  You have unusual pain in the chest, abdomen, or back.  You are bleeding from the mouth or rectum, or you have black or tarry stool.  You have an irregular or very fast heartbeat.  You have pain with breathing.  You have repeated fainting or seizure-like jerking during an episode.  You faint when sitting or lying down.  You have confusion.  You have difficulty walking.  You have severe weakness.  You have vision problems. If you fainted, call your local emergency services (911 in [place] Do not drive yourself to the hospital.  MAKE SURE YOU:  Understand these instructions.  Will watch your condition.  Will get help right away if you are not doing well or get worse. Document Released: 06/28/2005 Document Revised: 12/28/2011 Document Reviewed: 08/27/2011 [facility] Patient Information 2014 [place], [place].  Headaches, Frequently Asked Questions MIGRAINE HEADACHES Q: What is migraine? What causes it? How can I treat it? A: Generally, migraine headaches begin as a dull ache. Then they develop into a constant, throbbing, and pulsating pain. You may experience pain at the temples. You may experience pain at the front or back of one or both sides of the head. The pain is usually accompanied by a combination of:  Nausea.  Vomiting.  Sensitivity to light and noise. Some people (about 15%) experience an aura (see below) before an attack. The cause of migraine is believed to be chemical reactions in the brain. Treatment for migraine may include over-the-counter or prescription medications. It may also include self-help techniques. These include relaxation training and biofeedback.  Q: What is an aura? A: About 15% of people with migraine get an "aura". This is a sign of neurological symptoms that occur before a migraine  headache. You may see wavy or jagged lines, dots, or flashing lights. You might experience tunnel vision or blind spots in one or both eyes. The aura can include visual or auditory hallucinations (something imagined). It may include disruptions in smell (such as strange odors), taste or touch. Other symptoms include:  Numbness.  A "pins and needles" sensation.  Difficulty in recalling or speaking the correct word. These neurological events may last as long as 60 minutes. These symptoms will fade as the headache begins. Q: What is a trigger? A: Certain physical or environmental factors can lead to or "trigger" a migraine. These include:  Foods.  Hormonal changes.  Weather.  Stress. It is important to remember that triggers are different for everyone. To help prevent migraine attacks, you need to figure out which triggers affect you. Keep a headache diary. This is a good way to track triggers. The diary will help you talk to your healthcare professional about your condition. Q: Does weather affect migraines? A: Bright sunshine, hot, humid conditions, and drastic changes in barometric pressure may lead to, or "trigger," a migraine attack in some people. But studies have shown that weather does not act as a trigger for everyone with migraines. Q: What is the link between migraine and hormones? A: Hormones start and regulate many of your body's functions. Hormones keep your body  in balance within a constantly changing environment. The levels of hormones in your body are unbalanced at times. Examples are during menstruation, pregnancy, or menopause. That can lead to a migraine attack. In fact, about three quarters of all women with migraine report that their attacks are related to the menstrual cycle.  Q: Is there an increased risk of stroke for migraine sufferers? A: The likelihood of a migraine attack causing a stroke is very remote. That is not to say that migraine sufferers cannot have a stroke  associated with their migraines. In persons under age 23, the most common associated factor for stroke is migraine headache. But over the course of a person's normal life span, the occurrence of migraine headache may actually be associated with a reduced risk of dying from cerebrovascular disease due to stroke.  Q: What are acute medications for migraine? A: Acute medications are used to treat the pain of the headache after it has started. Examples over-the-counter medications, NSAIDs, ergots, and triptans.  Q: What are the triptans? A: Triptans are the newest class of abortive medications. They are specifically targeted to treat migraine. Triptans are vasoconstrictors. They moderate some chemical reactions in the brain. The triptans work on receptors in your brain. Triptans help to restore the balance of a neurotransmitter called serotonin. Fluctuations in levels of serotonin are thought to be a main cause of migraine.  Q: Are over-the-counter medications for migraine effective? A: Over-the-counter, or "OTC," medications may be effective in relieving mild to moderate pain and associated symptoms of migraine. But you should see your caregiver before beginning any treatment regimen for migraine.  Q: What are preventive medications for migraine? A: Preventive medications for migraine are sometimes referred to as "prophylactic" treatments. They are used to reduce the frequency, severity, and length of migraine attacks. Examples of preventive medications include antiepileptic medications, antidepressants, beta-blockers, calcium channel blockers, and NSAIDs (nonsteroidal anti-inflammatory drugs). Q: Why are anticonvulsants used to treat migraine? A: During the past few years, there has been an increased interest in antiepileptic drugs for the prevention of migraine. They are sometimes referred to as "anticonvulsants". Both epilepsy and migraine may be caused by similar reactions in the brain.  Q: Why are  antidepressants used to treat migraine? A: Antidepressants are typically used to treat people with depression. They may reduce migraine frequency by regulating chemical levels, such as serotonin, in the brain.  Q: What alternative therapies are used to treat migraine? A: The term "alternative therapies" is often used to describe treatments considered outside the scope of conventional Western medicine. Examples of alternative therapy include acupuncture, acupressure, and yoga. Another common alternative treatment is herbal therapy. Some herbs are believed to relieve headache pain. Always discuss alternative therapies with your caregiver before proceeding. Some herbal products contain arsenic and other toxins. TENSION HEADACHES Q: What is a tension-type headache? What causes it? How can I treat it? A: Tension-type headaches occur randomly. They are often the result of temporary stress, anxiety, fatigue, or anger. Symptoms include soreness in your temples, a tightening band-like sensation around your head (a "vice-like" ache). Symptoms can also include a pulling feeling, pressure sensations, and contracting head and neck muscles. The headache begins in your forehead, temples, or the back of your head and neck. Treatment for tension-type headache may include over-the-counter or prescription medications. Treatment may also include self-help techniques such as relaxation training and biofeedback. CLUSTER HEADACHES Q: What is a cluster headache? What causes it? How can I treat it? A: Cluster headache  gets its name because the attacks come in groups. The pain arrives with little, if any, warning. It is usually on one side of the head. A tearing or bloodshot eye and a runny nose on the same side of the headache may also accompany the pain. Cluster headaches are believed to be caused by chemical reactions in the brain. They have been described as the most severe and intense of any headache type. Treatment for cluster  headache includes prescription medication and oxygen. SINUS HEADACHES Q: What is a sinus headache? What causes it? How can I treat it? A: When a cavity in the bones of the face and skull (a sinus) becomes inflamed, the inflammation will cause localized pain. This condition is usually the result of an allergic reaction, a tumor, or an infection. If your headache is caused by a sinus blockage, such as an infection, you will probably have a fever. An x-ray will confirm a sinus blockage. Your caregiver's treatment might include antibiotics for the infection, as well as antihistamines or decongestants.  REBOUND HEADACHES Q: What is a rebound headache? What causes it? How can I treat it? A: A pattern of taking acute headache medications too often can lead to a condition known as "rebound headache." A pattern of taking too much headache medication includes taking it more than 2 days per week or in excessive amounts. That means more than the label or a caregiver advises. With rebound headaches, your medications not only stop relieving pain, they actually begin to cause headaches. Doctors treat rebound headache by tapering the medication that is being overused. Sometimes your caregiver will gradually substitute a different type of treatment or medication. Stopping may be a challenge. Regularly overusing a medication increases the potential for serious side effects. Consult a caregiver if you regularly use headache medications more than 2 days per week or more than the label advises. ADDITIONAL QUESTIONS AND ANSWERS Q: What is biofeedback? A: Biofeedback is a self-help treatment. Biofeedback uses special equipment to monitor your body's involuntary physical responses. Biofeedback monitors:  Breathing.  Pulse.  Heart rate.  Temperature.  Muscle tension.  Brain activity. Biofeedback helps you refine and perfect your relaxation exercises. You learn to control the physical responses that are related to  stress. Once the technique has been mastered, you do not need the equipment any more. Q: Are headaches hereditary? A: Four out of five (80%) of people that suffer report a family history of migraine. Scientists are not sure if this is genetic or a family predisposition. Despite the uncertainty, a child has a 50% chance of having migraine if one parent suffers. The child has a 75% chance if both parents suffer.  Q: Can children get headaches? A: By the time they reach high school, most young people have experienced some type of headache. Many safe and effective approaches or medications can prevent a headache from occurring or stop it after it has begun.  Q: What type of doctor should I see to diagnose and treat my headache? A: Start with your primary caregiver. Discuss his or her experience and approach to headaches. Discuss methods of classification, diagnosis, and treatment. Your caregiver may decide to recommend you to a headache specialist, depending upon your symptoms or other physical conditions. Having diabetes, allergies, etc., may require a more comprehensive and inclusive approach to your headache. The National [employer] Foundation will provide, upon request, a list of [facility] physician members in your state. Document Released: 09/18/2003 Document Revised: 09/20/2011 Document Reviewed: 02/26/2008 [employer]  Patient Information 2014 [place].

## 2013-10-02 NOTE — ED Provider Notes (Signed)
Medical screening examination/treatment/procedure(s) were conducted as a shared visit with non-physician practitioner(s) and myself.  I personally evaluated the patient during the encounter.   EKG Interpretation   Date/Time:  Sunday September 30 2013 09:28:35 EDT Ventricular Rate:  87 PR Interval:  140 QRS Duration: 87 QT Interval:  364 QTC Calculation: 438 R Axis:   64 Text Interpretation:  Sinus rhythm No significant change was found  Confirmed by Lynise Porr  MD, Dung Prien ([id number]) on 09/30/2013 9:33:03 AM      Overall well appearing. Refer to neurology and pcp. Workup in ER without significant abnormalities  Results for orders placed during the hospital encounter of 09/30/13  CBC WITH DIFFERENTIAL      Result Value Ref Range   WBC 6.1  4.0 - 10.5 K/uL   RBC 4.70  3.87 - 5.11 MIL/uL   Hemoglobin 13.3  12.0 - 15.0 g/dL   HCT 60.438.7  54.036.0 - 98.146.0 %   MCV 82.3  78.0 - 100.0 fL   MCH 28.3  26.0 - 34.0 pg   MCHC 34.4  30.0 - 36.0 g/dL   RDW 19.113.3  47.811.5 - 29.515.5 %   Platelets 173  150 - 400 K/uL   Neutrophils Relative % 65  43 - 77 %   Neutro Abs 4.0  1.7 - 7.7 K/uL   Lymphocytes Relative 24  12 - 46 %   Lymphs Abs 1.5  0.7 - 4.0 K/uL   Monocytes Relative 9  3 - 12 %   Monocytes Absolute 0.6  0.1 - 1.0 K/uL   Eosinophils Relative 1  0 - 5 %   Eosinophils Absolute 0.1  0.0 - 0.7 K/uL   Basophils Relative 1  0 - 1 %   Basophils Absolute 0.0  0.0 - 0.1 K/uL  BASIC METABOLIC PANEL      Result Value Ref Range   Sodium 137  137 - 147 mEq/L   Potassium 3.8  3.7 - 5.3 mEq/L   Chloride 100  96 - 112 mEq/L   CO2 24  19 - 32 mEq/L   Glucose, Bld 94  70 - 99 mg/dL   BUN 16  6 - 23 mg/dL   Creatinine, Ser 6.210.76  0.50 - 1.10 mg/dL   Calcium 9.3  8.4 - 30.810.5 mg/dL   GFR calc non Af Amer >90  >90 mL/min   GFR calc Af Amer >90  >90 mL/min  PREGNANCY, URINE      Result Value Ref Range   Preg Test, Ur NEGATIVE  NEGATIVE   Ct Head Wo Contrast  09/30/2013   CLINICAL DATA:  Headache.  Dizziness and loss of  consciousness.  EXAM: CT HEAD WITHOUT CONTRAST  TECHNIQUE: Contiguous axial images were obtained from the base of the skull through the vertex without intravenous contrast.  COMPARISON:  None.  FINDINGS: The brain has a normal appearance without evidence of malformation, atrophy, old or acute infarction, mass lesion, hemorrhage, hydrocephalus or extra-axial collection. The calvarium appears normal. Visualized sinuses, middle ears and mastoids are clear.  IMPRESSION: Normal examination.   Electronically Signed   By: Paulina FusiMark  Shogry M.D.   On: 09/30/2013 10:30  I personally reviewed the imaging tests through PACS system I reviewed available ER/hospitalization records through the EMR   Lyanne CoKevin M Zamarian Scarano, MD 10/02/13 [phone]

## 2013-10-18 ENCOUNTER — Encounter: Payer: Self-pay | Admitting: Family Medicine

## 2013-10-18 ENCOUNTER — Ambulatory Visit (INDEPENDENT_AMBULATORY_CARE_PROVIDER_SITE_OTHER): Payer: Medicaid Other | Admitting: Family Medicine

## 2013-10-18 VITALS — BP 136/82 | HR 88 | Temp 96.6°F | Ht 66.0 in | Wt 210.9 lb

## 2013-10-18 DIAGNOSIS — Z7251 High risk heterosexual behavior: Secondary | ICD-10-CM

## 2013-10-18 DIAGNOSIS — N949 Unspecified condition associated with female genital organs and menstrual cycle: Secondary | ICD-10-CM

## 2013-10-18 DIAGNOSIS — R102 Pelvic and perineal pain: Secondary | ICD-10-CM

## 2013-10-18 MED ORDER — FLUCONAZOLE 150 MG PO TABS: 150.0000 mg | ORAL_TABLET | Freq: Once | ORAL | Status: DC

## 2013-10-18 MED ORDER — IBUPROFEN 600 MG PO TABS: 600.0000 mg | ORAL_TABLET | Freq: Four times a day (QID) | ORAL | Status: DC | PRN

## 2013-10-18 NOTE — Patient Instructions (Signed)
Monilial Vaginitis  Vaginitis in a soreness, swelling and redness (inflammation) of the vagina and vulva. Monilial vaginitis is not a sexually transmitted infection.  CAUSES   Yeast vaginitis is caused by yeast (candida) that is normally found in your vagina. With a yeast infection, the candida has overgrown in number to a point that upsets the chemical balance.  SYMPTOMS   · White, thick vaginal discharge.  · Swelling, itching, redness and irritation of the vagina and possibly the lips of the vagina (vulva).  · Burning or painful urination.  · Painful intercourse.  DIAGNOSIS   Things that may contribute to monilial vaginitis are:  · Postmenopausal and virginal states.  · Pregnancy.  · Infections.  · Being tired, sick or stressed, especially if you had monilial vaginitis in the past.  · Diabetes. Good control will help lower the chance.  · Birth control pills.  · Tight fitting garments.  · Using bubble bath, feminine sprays, douches or deodorant tampons.  · Taking certain medications that kill germs (antibiotics).  · Sporadic recurrence can occur if you become ill.  TREATMENT   Your caregiver will give you medication.  · There are several kinds of anti monilial vaginal creams and suppositories specific for monilial vaginitis. For recurrent yeast infections, use a suppository or cream in the vagina 2 times a week, or as directed.  · Anti-monilial or steroid cream for the itching or irritation of the vulva may also be used. Get your caregiver's permission.  · Painting the vagina with methylene blue solution may help if the monilial cream does not work.  · Eating yogurt may help prevent monilial vaginitis.  HOME CARE INSTRUCTIONS   · Finish all medication as prescribed.  · Do not have sex until treatment is completed or after your caregiver tells you it is okay.  · Take warm sitz baths.  · Do not douche.  · Do not use tampons, especially scented ones.  · Wear cotton underwear.  · Avoid tight pants and panty  hose.  · Tell your sexual partner that you have a yeast infection. They should go to their caregiver if they have symptoms such as mild rash or itching.  · Your sexual partner should be treated as well if your infection is difficult to eliminate.  · Practice safer sex. Use condoms.  · Some vaginal medications cause latex condoms to fail. Vaginal medications that harm condoms are:  · Cleocin cream.  · Butoconazole (Femstat®).  · Terconazole (Terazol®) vaginal suppository.  · Miconazole (Monistat®) (may be purchased over the counter).  SEEK MEDICAL CARE IF:   · You have a temperature by mouth above 102° F (38.9° C).  · The infection is getting worse after 2 days of treatment.  · The infection is not getting better after 3 days of treatment.  · You develop blisters in or around your vagina.  · You develop vaginal bleeding, and it is not your menstrual period.  · You have pain when you urinate.  · You develop intestinal problems.  · You have pain with sexual intercourse.  Document Released: 04/07/2005 Document Revised: 09/20/2011 Document Reviewed: 12/20/2008  ExitCare® Patient Information ©2014 [employer].

## 2013-10-18 NOTE — Progress Notes (Signed)
Patient ID: Chloe Rodgers, female   DOB: Jul 10, 1991, 23 y.o.   MRN: [id number] S:   23 yo G3P2012 who presents today for STD check and chronic pelvic pain.     - states has had worsening discharge over the last few weeks - some burning and itching  - hx of PID back in August and treated with TOC negative.   - had a TAB @ 16weeks in January  - 3 days later - seen in ED for abd pain with a CT that was negative but with a 2mm nonobstructing right nephrolithiasis.  - has had 2 CT scans in the last 2 months - a pelvic US that was negative.    - states has been sexually active but not since around the time of her admission for PID - no fevers, chills, nausea, vomiting, diarrhea, constipation, changes in weight, cp, sob, lee.    Past Medical History  Diagnosis Date  . Anemia   . Depression   . Complication of anesthesia     ??, seizure with wisdom teeth  . Urinary tract infection   . Psoriasis   . PID (acute pelvic inflammatory disease)   . Chlamydia   . PONV (postoperative nausea and vomiting)   . [place]     [place]  . Headache(784.0)     otc med prn  . Anxiety   . Migraine    No current outpatient prescriptions on file prior to visit.   No current facility-administered medications on file prior to visit.   No Known Allergies History   Social History  . Marital Status: Single    Spouse Name: N/A    Number of Children: N/A  . Years of Education: N/A   Occupational History  . Not on file.   Social History Main Topics  . Smoking status: Former Games developermoker  . Smokeless tobacco: Never Used  . Alcohol Use: Yes     Comment: socially but none with pregnancy  . Drug Use: No     Comment: Last use in 07/2011  . Sexual Activity: Yes    Birth Control/ Protection: None     Comment: pregnant   Other Topics Concern  . Not on file   Social History Narrative  . No narrative on file   Ms. Ashley JacobsReyes does not currently have medications on file.  Family History   Problem Relation Age of Onset  . Hypertension Mother   . Diabetes Mother   . Cancer Mother     BREAST  . Stroke Mother   . Seizures Mother   . Asthma Daughter   . Hypertension Maternal Grandmother   . Diabetes Maternal Grandmother   . Cancer Maternal Grandmother     BONE CANCER  . Other Neg Hx    O:  BP 136/82  Pulse 88  Temp(Src) 96.6 F (35.9 C) (Oral)  Ht 5\' 6"  (1.676 m)  Wt 210 lb 14.4 oz (95.664 kg)  BMI 34.06 kg/m2  LMP 10/02/2013 GENERAL: Well-developed, female, obese, in no acute distress.  HEENT: Normocephalic, atraumatic. Sclerae anicteric.  NECK: Supple. Normal thyroid.  LUNGS: Clear to auscultation bilaterally.  HEART: Regular rate and rhythm. ABDOMEN: Soft,  nondistended. Mild diffuse tenderness in her pelvis that seems to be just as tender to superficial as to deep palpation. No organomegaly. PELVIC: Normal external female genitalia. Vagina is pink and rugated. Thick curd like discharge.  Normal cervix contour.  Uterus is normal in size. No adnexal mass or tenderness. No  CMT.  Diffuse vaginal tenderness throughout.   EXTREMITIES: No cyanosis, clubbing, or edema, 2+ distal pulses.  A/P Pelvic pain more chronic in origin. Unclear etiology. Seems to have an inflammatory origin.  - rx of ibuprofen 800mg  TID x 30 days.  STD testing today with gc/chl/wet prep and HIV/RPR.  Will treat presumed yeast with diflucan today also  Will f/u results of testing and treat as needed. Plan to f/u here in 33month if no improvement in pain.

## 2013-10-19 LAB — GC/CHLAMYDIA PROBE AMP
CT Probe RNA: NEGATIVE
GC Probe RNA: NEGATIVE

## 2013-10-19 LAB — WET PREP, GENITAL
Clue Cells Wet Prep HPF POC: NONE SEEN
Trich, Wet Prep: NONE SEEN
Yeast Wet Prep HPF POC: NONE SEEN

## 2013-10-19 LAB — RPR

## 2013-10-19 LAB — HIV ANTIBODY (ROUTINE TESTING W REFLEX): HIV 1&2 Ab, 4th Generation: NONREACTIVE

## 2013-10-22 ENCOUNTER — Telehealth: Payer: Self-pay | Admitting: *Deleted

## 2013-10-22 ENCOUNTER — Encounter: Payer: Self-pay | Admitting: *Deleted

## 2013-10-22 NOTE — Telephone Encounter (Signed)
Message copied by Dorothyann PengHAIZLIP, Ellisha Bankson E on Mon Oct 22, 2013  1:23 PM ------      Message from: Vale HavenBECK, KELI L      Created: Fri Oct 19, 2013  2:35 PM       Hi ladies, can you let her know that all her lab results were negative? She wanted to be notified.  Thanks! ------

## 2013-10-22 NOTE — Telephone Encounter (Signed)
Attempted to contact patient, number disconnected.  Will send letter.  Letter sent.

## 2013-11-01 ENCOUNTER — Ambulatory Visit: Payer: Medicaid Other | Admitting: Obstetrics & Gynecology

## 2013-12-31 ENCOUNTER — Inpatient Hospital Stay (HOSPITAL_COMMUNITY)
Admission: AD | Admit: 2013-12-31 | Discharge: 2013-12-31 | Disposition: A | Discharge status: Home / Self Care | Payer: Medicaid Other | Source: Ambulatory Visit | Attending: Obstetrics & Gynecology | Admitting: Obstetrics & Gynecology

## 2013-12-31 ENCOUNTER — Inpatient Hospital Stay (HOSPITAL_COMMUNITY): Payer: Medicaid Other

## 2013-12-31 ENCOUNTER — Encounter (HOSPITAL_COMMUNITY): Payer: Self-pay | Admitting: *Deleted

## 2013-12-31 DIAGNOSIS — B9689 Other specified bacterial agents as the cause of diseases classified elsewhere: Secondary | ICD-10-CM | POA: Insufficient documentation

## 2013-12-31 DIAGNOSIS — N949 Unspecified condition associated with female genital organs and menstrual cycle: Secondary | ICD-10-CM | POA: Insufficient documentation

## 2013-12-31 DIAGNOSIS — A499 Bacterial infection, unspecified: Secondary | ICD-10-CM | POA: Insufficient documentation

## 2013-12-31 DIAGNOSIS — N83209 Unspecified ovarian cyst, unspecified side: Secondary | ICD-10-CM | POA: Insufficient documentation

## 2013-12-31 DIAGNOSIS — R1032 Left lower quadrant pain: Secondary | ICD-10-CM | POA: Insufficient documentation

## 2013-12-31 DIAGNOSIS — N760 Acute vaginitis: Secondary | ICD-10-CM | POA: Insufficient documentation

## 2013-12-31 LAB — URINALYSIS, ROUTINE W REFLEX MICROSCOPIC
Bilirubin Urine: NEGATIVE
Glucose, UA: NEGATIVE mg/dL
Hgb urine dipstick: NEGATIVE
Ketones, ur: 15 mg/dL — AB
Nitrite: NEGATIVE
Protein, ur: NEGATIVE mg/dL
Specific Gravity, Urine: >1.03 — ABNORMAL HIGH (ref 1.005–1.030)
Urobilinogen, UA: 1 mg/dL (ref 0.0–1.0)
pH: 6 (ref 5.0–8.0)

## 2013-12-31 LAB — URINE MICROSCOPIC-ADD ON

## 2013-12-31 LAB — WET PREP, GENITAL
Trich, Wet Prep: NONE SEEN
Yeast Wet Prep HPF POC: NONE SEEN

## 2013-12-31 LAB — POCT PREGNANCY, URINE: Preg Test, Ur: NEGATIVE

## 2013-12-31 MED ORDER — TRAMADOL-ACETAMINOPHEN 37.5-325 MG PO TABS: 1.0000 | ORAL_TABLET | Freq: Four times a day (QID) | ORAL | Status: DC | PRN

## 2013-12-31 MED ORDER — METRONIDAZOLE 500 MG PO TABS: 500.0000 mg | ORAL_TABLET | Freq: Two times a day (BID) | ORAL | Status: DC

## 2013-12-31 NOTE — MAU Provider Note (Signed)
None     Chief Complaint:  Abdominal Pain and Vaginal Pain   Chloe ParrShakilla Rodgers is  23 y.o. [id number].  Patient's last menstrual period was 12/25/2013.Marland Kitchen.  Her pregnancy status is negative.  She presents complaining of Abdominal Pain and Vaginal Pain She is not on any birth control (but requests appt with GYN clinic to start).  C/O LLQ pain for about 2 weeks.  States it feels like an ovarian cyst (had a collapsing hemorrhagic cyst on u/s 8 months ago).  Also c/o vaginal d/c with odor for 2 weeks.   Past Medical History  Diagnosis Date  . Anemia   . Depression   . Complication of anesthesia     ??, seizure with wisdom teeth  . Urinary tract infection   . Psoriasis   . PID (acute pelvic inflammatory disease)   . Chlamydia   . PONV (postoperative nausea and vomiting)   . Seizures     July [place]  . Headache(784.0)     otc med prn  . Anxiety   . Migraine     Past Surgical History  Procedure Laterality Date  . Cesarean section    . Skin graft      off abd, onto arm  . Wisdom tooth extraction    . Cesarean section  06/06/2012    Procedure: CESAREAN SECTION;  Surgeon: Adam PhenixJames G Arnold, MD;  Location: [facility];  Service: Obstetrics;  Laterality: N/A;    Family History  Problem Relation Age of Onset  . Hypertension Mother   . Diabetes Mother   . Cancer Mother     BREAST  . Stroke Mother   . Seizures Mother   . Asthma Daughter   . Hypertension Maternal Grandmother   . Diabetes Maternal Grandmother   . Cancer Maternal Grandmother     BONE CANCER  . Other Neg Hx     History  Substance Use Topics  . Smoking status: Never Smoker   . Smokeless tobacco: Never Used  . Alcohol Use: Yes     Comment: socially but none with pregnancy    Allergies: No Known Allergies  Prescriptions prior to admission  Medication Sig Dispense Refill  . ibuprofen (ADVIL,MOTRIN) 200 MG tablet Take 200-400 mg by mouth every 6 (six) hours as needed (used for migraines.).      Marland Kitchen. ibuprofen  (ADVIL,MOTRIN) 600 MG tablet Take 1 tablet (600 mg total) by mouth every 6 (six) hours as needed.  30 tablet  2     Review of Systems   Constitutional: Negative for fever and chills Eyes: Negative for visual disturbances Respiratory: Negative for shortness of breath, dyspnea Cardiovascular: Negative for chest pain or palpitations  Gastrointestinal: Negative for vomiting, diarrhea and constipation Genitourinary: Negative for dysuria and urgency Musculoskeletal: Negative for back pain, joint pain, myalgias  Neurological: Negative for dizziness and headaches     Physical Exam   Blood pressure 128/77, pulse 82, temperature 97.7 F (36.5 C), temperature source Oral, resp. rate 16, height 5\' 6"  (1.676 m), weight 95.981 kg (211 lb 9.6 oz), last menstrual period 12/25/2013, SpO2 100.00%, unknown if currently breastfeeding.  General: General appearance - alert, well appearing, and in no distress and oriented to person, place, and time Chest - clear to auscultation, no wheezes, rales or rhonchi, symmetric air entry Heart - normal rate and regular rhythm Abdomen - tenderness noted LLQ no rebound tenderness noted Pelvic - White frothy d/c with amine odor.  Tender to bimanual in LLQ Extremities -  no pedal edema noted   Labs: Results for orders placed during the hospital encounter of 12/31/13 (from the past 24 hour(s))  URINALYSIS, ROUTINE W REFLEX MICROSCOPIC   Collection Time    12/31/13  2:30 PM      Result Value Ref Range   Color, Urine YELLOW  YELLOW   APPearance CLOUDY (*) CLEAR   Specific Gravity, Urine >1.030 (*) 1.005 - 1.030   pH 6.0  5.0 - 8.0   Glucose, UA NEGATIVE  NEGATIVE mg/dL   Hgb urine dipstick NEGATIVE  NEGATIVE   Bilirubin Urine NEGATIVE  NEGATIVE   Ketones, ur 15 (*) NEGATIVE mg/dL   Protein, ur NEGATIVE  NEGATIVE mg/dL   Urobilinogen, UA 1.0  0.0 - 1.0 mg/dL   Nitrite NEGATIVE  NEGATIVE   Leukocytes, UA MODERATE (*) NEGATIVE  URINE MICROSCOPIC-ADD ON    Collection Time    12/31/13  2:30 PM      Result Value Ref Range   Squamous Epithelial / LPF MANY (*) RARE   WBC, UA 21-50  <3 WBC/hpf   RBC / HPF 3-6  <3 RBC/hpf   Bacteria, UA MANY (*) RARE   Urine-Other MUCOUS PRESENT    POCT PREGNANCY, URINE   Collection Time    12/31/13  3:18 PM      Result Value Ref Range   Preg Test, Ur NEGATIVE  NEGATIVE  WET PREP, GENITAL   Collection Time    12/31/13  5:15 PM      Result Value Ref Range   Yeast Wet Prep HPF POC NONE SEEN  NONE SEEN   Trich, Wet Prep NONE SEEN  NONE SEEN   Clue Cells Wet Prep HPF POC FEW (*) NONE SEEN   WBC, Wet Prep HPF POC MODERATE (*) NONE SEEN   Imaging Studies:      Assessment:  BV Simple ovarian cyst  Plan:  Flagyl Keep appt 6/25 with GYN clinic to start birth control May repeat u/s in a few months prn  CRESENZO-DISHMAN,FRANCES

## 2013-12-31 NOTE — MAU Note (Signed)
Patient states she has been having left side abdominal pain for about 2 weeks. States she has a history of ovarian cysts and this pain feels like that. Has a vaginal irritation with an odor for a while. Denies bleeding or discharge at this time.

## 2013-12-31 NOTE — Discharge Instructions (Signed)
Bacterial Vaginosis °Bacterial vaginosis is a vaginal infection that occurs when the normal balance of bacteria in the vagina is disrupted. It results from an overgrowth of certain bacteria. This is the most common vaginal infection in women of childbearing age. Treatment is important to prevent complications, especially in pregnant women, as it can cause a premature delivery. °CAUSES  °Bacterial vaginosis is caused by an increase in harmful bacteria that are normally present in smaller amounts in the vagina. Several different kinds of bacteria can cause bacterial vaginosis. However, the reason that the condition develops is not fully understood. °RISK FACTORS °Certain activities or behaviors can put you at an increased risk of developing bacterial vaginosis, including: °· Having a new sex partner or multiple sex partners. °· Douching. °· Using an intrauterine device (IUD) for contraception. °Women do not get bacterial vaginosis from toilet seats, bedding, swimming pools, or contact with objects around them. °SIGNS AND SYMPTOMS  °Some women with bacterial vaginosis have no signs or symptoms. Common symptoms include: °· Grey vaginal discharge. °· A fishlike odor with discharge, especially after sexual intercourse. °· Itching or burning of the vagina and vulva. °· Burning or pain with urination. °DIAGNOSIS  °Your health care provider will take a medical history and examine the vagina for signs of bacterial vaginosis. A sample of vaginal fluid may be taken. Your health care provider will look at this sample under a microscope to check for bacteria and abnormal cells. A vaginal pH test may also be done.  °TREATMENT  °Bacterial vaginosis may be treated with antibiotic medicines. These may be given in the form of a pill or a vaginal cream. A second round of antibiotics may be prescribed if the condition comes back after treatment.  °HOME CARE INSTRUCTIONS  °· Only take over-the-counter or prescription medicines as  directed by your health care provider. °· If antibiotic medicine was prescribed, take it as directed. Make sure you finish it even if you start to feel better. °· Do not have sex until treatment is completed. °· Tell all sexual partners that you have a vaginal infection. They should see their health care provider and be treated if they have problems, such as a mild rash or itching. °· Practice safe sex by using condoms and only having one sex partner. °SEEK MEDICAL CARE IF:  °· Your symptoms are not improving after 3 days of treatment. °· You have increased discharge or pain. °· You have a fever. °MAKE SURE YOU:  °· Understand these instructions. °· Will watch your condition. °· Will get help right away if you are not doing well or get worse. °FOR MORE INFORMATION  °Centers for Disease Control and Prevention, Division of STD Prevention: www.cdc.gov/std °American Sexual Health Association (ASHA): www.ashastd.org  °Document Released: 06/28/2005 Document Revised: 04/18/2013 Document Reviewed: 02/07/2013 °ExitCare® Patient Information ©2015 [employer]. This information is not intended to replace advice given to you by your health care provider. Make sure you discuss any questions you have with your health care provider. °Ovarian Cyst °An ovarian cyst is a fluid-filled sac that forms on an ovary. The ovaries are small organs that produce eggs in women. Various types of cysts can form on the ovaries. Most are not cancerous. Many do not cause problems, and they often go away on their own. Some may cause symptoms and require treatment. Common types of ovarian cysts include: °· Functional cysts--These cysts may occur every month during the menstrual cycle. This is normal. The cysts usually go away with the   next menstrual cycle if the woman does not get pregnant. Usually, there are no symptoms with a functional cyst. °· Endometrioma cysts--These cysts form from the tissue that lines the uterus. They are also called  "chocolate cysts" because they become filled with blood that turns brown. This type of cyst can cause pain in the lower abdomen during intercourse and with your menstrual period. °· Cystadenoma cysts--This type develops from the cells on the outside of the ovary. These cysts can get very big and cause lower abdomen pain and pain with intercourse. This type of cyst can twist on itself, cut off its blood supply, and cause severe pain. It can also easily rupture and cause a lot of pain. °· Dermoid cysts--This type of cyst is sometimes found in both ovaries. These cysts may contain different kinds of body tissue, such as skin, teeth, hair, or cartilage. They usually do not cause symptoms unless they get very big. °· Theca lutein cysts--These cysts occur when too much of a certain hormone (human chorionic gonadotropin) is produced and overstimulates the ovaries to produce an egg. This is most common after procedures used to assist with the conception of a baby (in vitro fertilization). °CAUSES  °· Fertility drugs can cause a condition in which multiple large cysts are formed on the ovaries. This is called ovarian hyperstimulation syndrome. °· A condition called polycystic ovary syndrome can cause hormonal imbalances that can lead to nonfunctional ovarian cysts. °SIGNS AND SYMPTOMS  °Many ovarian cysts do not cause symptoms. If symptoms are present, they may include: °· Pelvic pain or pressure. °· Pain in the lower abdomen. °· Pain during sexual intercourse. °· Increasing girth (swelling) of the abdomen. °· Abnormal menstrual periods. °· Increasing pain with menstrual periods. °· Stopping having menstrual periods without being pregnant. °DIAGNOSIS  °These cysts are commonly found during a routine or annual pelvic exam. Tests may be ordered to find out more about the cyst. These tests may include: °· Ultrasound. °· X-ray of the pelvis. °· CT scan. °· MRI. °· Blood tests. °TREATMENT  °Many ovarian cysts go away on their own  without treatment. Your health care provider may want to check your cyst regularly for 2-3 months to see if it changes. For women in menopause, it is particularly important to monitor a cyst closely because of the higher rate of ovarian cancer in menopausal women. When treatment is needed, it may include any of the following: °· A procedure to drain the cyst (aspiration). This may be done using a long needle and ultrasound. It can also be done through a laparoscopic procedure. This involves using a thin, lighted tube with a tiny camera on the end (laparoscope) inserted through a small incision. °· Surgery to remove the whole cyst. This may be done using laparoscopic surgery or an open surgery involving a larger incision in the lower abdomen. °· Hormone treatment or birth control pills. These methods are sometimes used to help dissolve a cyst. °HOME CARE INSTRUCTIONS  °· Only take over-the-counter or prescription medicines as directed by your health care provider. °· Follow up with your health care provider as directed. °· Get regular pelvic exams and Pap tests. °SEEK MEDICAL CARE IF:  °· Your periods are late, irregular, or painful, or they stop. °· Your pelvic pain or abdominal pain does not go away. °· Your abdomen becomes larger or swollen. °· You have pressure on your bladder or trouble emptying your bladder completely. °· You have pain during sexual intercourse. °· You   have feelings of fullness, pressure, or discomfort in your stomach. °· You lose weight for no apparent reason. °· You feel generally ill. °· You become constipated. °· You lose your appetite. °· You develop acne. °· You have an increase in body and facial hair. °· You are gaining weight, without changing your exercise and eating habits. °· You think you are pregnant. °SEEK IMMEDIATE MEDICAL CARE IF:  °· You have increasing abdominal pain. °· You feel sick to your stomach (nauseous), and you throw up (vomit). °· You develop a fever that comes on  suddenly. °· You have abdominal pain during a bowel movement. °· Your menstrual periods become heavier than usual. °MAKE SURE YOU: °· Understand these instructions. °· Will watch your condition. °· Will get help right away if you are not doing well or get worse. °Document Released: 06/28/2005 Document Revised: 07/03/2013 Document Reviewed: 03/05/2013 °ExitCare® Patient Information ©2015 [employer]. This information is not intended to replace advice given to you by your health care provider. Make sure you discuss any questions you have with your health care provider. ° °

## 2014-01-01 LAB — GC/CHLAMYDIA PROBE AMP
CT Probe RNA: NEGATIVE
GC Probe RNA: NEGATIVE

## 2014-01-03 ENCOUNTER — Ambulatory Visit: Payer: Medicaid Other | Admitting: Advanced Practice Midwife

## 2014-01-03 NOTE — MAU Provider Note (Signed)
Attestation of Attending Supervision of Advanced Practitioner (CNM/NP): Evaluation and management procedures were performed by the Advanced Practitioner under my supervision and collaboration. I have reviewed the Advanced Practitioner's note and chart, and I agree with the management and plan.  LEGGETT,KELLY H. 1:38 PM

## 2014-01-20 ENCOUNTER — Emergency Department (HOSPITAL_COMMUNITY)
Admission: EM | Admit: 2014-01-20 | Discharge: 2014-01-20 | Disposition: A | Discharge status: Home / Self Care | Payer: Medicaid Other | Attending: Emergency Medicine | Admitting: Emergency Medicine

## 2014-01-20 ENCOUNTER — Emergency Department (HOSPITAL_COMMUNITY): Payer: Medicaid Other

## 2014-01-20 ENCOUNTER — Encounter (HOSPITAL_COMMUNITY): Payer: Self-pay | Admitting: Emergency Medicine

## 2014-01-20 DIAGNOSIS — Z872 Personal history of diseases of the skin and subcutaneous tissue: Secondary | ICD-10-CM | POA: Diagnosis not present

## 2014-01-20 DIAGNOSIS — A499 Bacterial infection, unspecified: Secondary | ICD-10-CM | POA: Insufficient documentation

## 2014-01-20 DIAGNOSIS — B9789 Other viral agents as the cause of diseases classified elsewhere: Secondary | ICD-10-CM | POA: Insufficient documentation

## 2014-01-20 DIAGNOSIS — N83209 Unspecified ovarian cyst, unspecified side: Secondary | ICD-10-CM | POA: Diagnosis not present

## 2014-01-20 DIAGNOSIS — B349 Viral infection, unspecified: Secondary | ICD-10-CM

## 2014-01-20 DIAGNOSIS — Z862 Personal history of diseases of the blood and blood-forming organs and certain disorders involving the immune mechanism: Secondary | ICD-10-CM | POA: Insufficient documentation

## 2014-01-20 DIAGNOSIS — R1032 Left lower quadrant pain: Secondary | ICD-10-CM | POA: Diagnosis not present

## 2014-01-20 DIAGNOSIS — B9689 Other specified bacterial agents as the cause of diseases classified elsewhere: Secondary | ICD-10-CM | POA: Insufficient documentation

## 2014-01-20 DIAGNOSIS — R52 Pain, unspecified: Secondary | ICD-10-CM | POA: Insufficient documentation

## 2014-01-20 DIAGNOSIS — Z8679 Personal history of other diseases of the circulatory system: Secondary | ICD-10-CM | POA: Insufficient documentation

## 2014-01-20 DIAGNOSIS — R110 Nausea: Secondary | ICD-10-CM | POA: Insufficient documentation

## 2014-01-20 DIAGNOSIS — Z3202 Encounter for pregnancy test, result negative: Secondary | ICD-10-CM | POA: Insufficient documentation

## 2014-01-20 DIAGNOSIS — Z9889 Other specified postprocedural states: Secondary | ICD-10-CM | POA: Insufficient documentation

## 2014-01-20 DIAGNOSIS — Z8619 Personal history of other infectious and parasitic diseases: Secondary | ICD-10-CM | POA: Insufficient documentation

## 2014-01-20 DIAGNOSIS — Z87440 Personal history of urinary (tract) infections: Secondary | ICD-10-CM | POA: Insufficient documentation

## 2014-01-20 DIAGNOSIS — Z8669 Personal history of other diseases of the nervous system and sense organs: Secondary | ICD-10-CM | POA: Diagnosis not present

## 2014-01-20 DIAGNOSIS — F411 Generalized anxiety disorder: Secondary | ICD-10-CM | POA: Insufficient documentation

## 2014-01-20 DIAGNOSIS — R1031 Right lower quadrant pain: Secondary | ICD-10-CM | POA: Diagnosis present

## 2014-01-20 DIAGNOSIS — N760 Acute vaginitis: Secondary | ICD-10-CM | POA: Insufficient documentation

## 2014-01-20 DIAGNOSIS — N83202 Unspecified ovarian cyst, left side: Secondary | ICD-10-CM

## 2014-01-20 LAB — CBC WITH DIFFERENTIAL/PLATELET
Basophils Absolute: 0 10*3/uL (ref 0.0–0.1)
Basophils Relative: 0 % (ref 0–1)
Eosinophils Absolute: 0 10*3/uL (ref 0.0–0.7)
Eosinophils Relative: 1 % (ref 0–5)
HCT: 39.1 % (ref 36.0–46.0)
Hemoglobin: 12.8 g/dL (ref 12.0–15.0)
Lymphocytes Relative: 21 % (ref 12–46)
Lymphs Abs: 0.9 10*3/uL (ref 0.7–4.0)
MCH: 27.1 pg (ref 26.0–34.0)
MCHC: 32.7 g/dL (ref 30.0–36.0)
MCV: 82.8 fL (ref 78.0–100.0)
Monocytes Absolute: 0.6 10*3/uL (ref 0.1–1.0)
Monocytes Relative: 14 % — ABNORMAL HIGH (ref 3–12)
Neutro Abs: 2.6 10*3/uL (ref 1.7–7.7)
Neutrophils Relative %: 64 % (ref 43–77)
Platelets: 139 10*3/uL — ABNORMAL LOW (ref 150–400)
RBC: 4.72 MIL/uL (ref 3.87–5.11)
RDW: 14.2 % (ref 11.5–15.5)
WBC: 4.1 10*3/uL (ref 4.0–10.5)

## 2014-01-20 LAB — URINALYSIS, ROUTINE W REFLEX MICROSCOPIC
Bilirubin Urine: NEGATIVE
Glucose, UA: NEGATIVE mg/dL
Hgb urine dipstick: NEGATIVE
Ketones, ur: NEGATIVE mg/dL
Leukocytes, UA: NEGATIVE
Nitrite: NEGATIVE
Protein, ur: NEGATIVE mg/dL
Specific Gravity, Urine: 1.022 (ref 1.005–1.030)
Urobilinogen, UA: 0.2 mg/dL (ref 0.0–1.0)
pH: 6 (ref 5.0–8.0)

## 2014-01-20 LAB — LIPASE, BLOOD: Lipase: 14 U/L (ref 11–59)

## 2014-01-20 LAB — COMPREHENSIVE METABOLIC PANEL
ALT: 17 U/L (ref 0–35)
AST: 18 U/L (ref 0–37)
Albumin: 4.2 g/dL (ref 3.5–5.2)
Alkaline Phosphatase: 72 U/L (ref 39–117)
Anion gap: 18 — ABNORMAL HIGH (ref 5–15)
BUN: 12 mg/dL (ref 6–23)
CO2: 21 mEq/L (ref 19–32)
Calcium: 9.1 mg/dL (ref 8.4–10.5)
Chloride: 102 mEq/L (ref 96–112)
Creatinine, Ser: 0.76 mg/dL (ref 0.50–1.10)
GFR calc Af Amer: >90 mL/min (ref >90)
GFR calc non Af Amer: >90 mL/min (ref >90)
Glucose, Bld: 76 mg/dL (ref 70–99)
Potassium: 3.9 mEq/L (ref 3.7–5.3)
Sodium: 141 mEq/L (ref 137–147)
Total Bilirubin: 0.2 mg/dL — ABNORMAL LOW (ref 0.3–1.2)
Total Protein: 8.6 g/dL — ABNORMAL HIGH (ref 6.0–8.3)

## 2014-01-20 LAB — I-STAT CG4 LACTIC ACID, ED: Lactic Acid, Venous: 0.86 mmol/L (ref 0.5–2.2)

## 2014-01-20 LAB — WET PREP, GENITAL
Trich, Wet Prep: NONE SEEN
Yeast Wet Prep HPF POC: NONE SEEN

## 2014-01-20 LAB — PREGNANCY, URINE: Preg Test, Ur: NEGATIVE

## 2014-01-20 MED ORDER — METRONIDAZOLE 500 MG PO TABS
500.0000 mg | ORAL_TABLET | Freq: Once | ORAL | Status: AC
  Administered 2014-01-20: 500 mg via ORAL
  Filled 2014-01-20: qty 1

## 2014-01-20 MED ORDER — SODIUM CHLORIDE 0.9 % IV BOLUS (SEPSIS)
1000.0000 mL | Freq: Once | INTRAVENOUS | Status: AC
  Administered 2014-01-20: 1000 mL via INTRAVENOUS

## 2014-01-20 MED ORDER — ACETAMINOPHEN 500 MG PO TABS
1000.0000 mg | ORAL_TABLET | Freq: Once | ORAL | Status: AC
  Administered 2014-01-20: 1000 mg via ORAL
  Filled 2014-01-20: qty 2

## 2014-01-20 MED ORDER — TRAMADOL HCL 50 MG PO TABS: 50.0000 mg | ORAL_TABLET | Freq: Four times a day (QID) | ORAL | Status: DC | PRN

## 2014-01-20 MED ORDER — KETOROLAC TROMETHAMINE 30 MG/ML IJ SOLN
30.0000 mg | Freq: Once | INTRAMUSCULAR | Status: AC
  Administered 2014-01-20: 30 mg via INTRAVENOUS
  Filled 2014-01-20: qty 1

## 2014-01-20 MED ORDER — METRONIDAZOLE 500 MG PO TABS: 500.0000 mg | ORAL_TABLET | Freq: Three times a day (TID) | ORAL | Status: DC

## 2014-01-20 NOTE — ED Provider Notes (Addendum)
CSN: [id number]     Arrival date & time 01/20/14  1405 History   First MD Initiated Contact with Patient 01/20/14 1558     Chief Complaint  Patient presents with  . Generalized Body Aches     (Consider location/radiation/quality/duration/timing/severity/associated sxs/prior Treatment) The history is provided by the patient.  Chloe Rodgers is a 23 y.o. female hx of UTI, PID, BV, ovarian cyst here with myalgias, ab pain. Was diagnosed with L ovarian cyst 3 weeks ago. Has been having intermittent LUQ and LLQ pain. Also has been having nausea but no vomiting. Has decreased appetite for a week. Also intermittent fevers for the last week, today was 102 at home. She has whitish vaginal discharge as well and recently finished abx for BV. Not sexually active for several months. No cough. Has diffuse myalgias as well. Has some neck pain associated with it but no neck stiffness.    Past Medical History  Diagnosis Date  . Anemia   . Depression   . Complication of anesthesia     ??, seizure with wisdom teeth  . Urinary tract infection   . Psoriasis   . PID (acute pelvic inflammatory disease)   . Chlamydia   . PONV (postoperative nausea and vomiting)   . Seizures     July 2013 - [place]  . Headache(784.0)     otc med prn  . Anxiety   . Migraine    Past Surgical History  Procedure Laterality Date  . Cesarean section    . Skin graft      off abd, onto arm  . Wisdom tooth extraction    . Cesarean section  06/06/2012    Procedure: CESAREAN SECTION;  Surgeon: Adam Phenix, MD;  Location: [facility];  Service: Obstetrics;  Laterality: N/A;   Family History  Problem Relation Age of Onset  . Hypertension Mother   . Diabetes Mother   . Cancer Mother     BREAST  . Stroke Mother   . Seizures Mother   . Asthma Daughter   . Hypertension Maternal Grandmother   . Diabetes Maternal Grandmother   . Cancer Maternal Grandmother     BONE CANCER  . Other Neg Hx    History  Substance Use  Topics  . Smoking status: Never Smoker   . Smokeless tobacco: Never Used  . Alcohol Use: Yes     Comment: socially but none with pregnancy   OB History   Grav Para Term Preterm Abortions TAB SAB Ect Mult Living   3 2 2  0 1 1 0 0 0 2     Review of Systems  Gastrointestinal: Positive for abdominal pain.  Musculoskeletal: Positive for myalgias and neck pain. Negative for neck stiffness.  All other systems reviewed and are negative.     Allergies  Review of patient's allergies indicates no known allergies.  Home Medications   Prior to Admission medications   Not on File   BP 120/71  Pulse 94  Temp(Src) 99.4 F (37.4 C) (Oral)  Resp 16  Ht 5\' 6"  (1.676 m)  Wt 207 lb 4.8 oz (94.031 kg)  BMI 33.48 kg/m2  SpO2 99%  LMP 01/11/2014 Physical Exam  Nursing note and vitals reviewed. Constitutional: She is oriented to person, place, and time.  Anxious, mildly dehydrated   HENT:  Head: Normocephalic.  MM slightly dry   Eyes: Conjunctivae are normal. Pupils are equal, round, and reactive to light.  Neck: Normal range of motion. Neck supple.  No meningeal signs   Cardiovascular: Normal rate, regular rhythm and normal heart sounds.   Pulmonary/Chest: Effort normal and breath sounds normal. No respiratory distress. She has no wheezes. She has no rales.  Abdominal: Soft. Bowel sounds are normal.  Mild LLQ and LUQ tenderness. No CVAT   Genitourinary:  Whitish vaginal discharge. No obvious CMT. Mild L adnexal tenderness   Musculoskeletal: Normal range of motion. She exhibits no edema and no tenderness.  Neurological: She is alert and oriented to person, place, and time. No cranial nerve deficit. Coordination normal.  Skin: Skin is warm and dry.  Psychiatric: She has a normal mood and affect. Her behavior is normal. Judgment and thought content normal.    ED Course  Procedures (including critical care time) Labs Review Labs Reviewed  WET PREP, GENITAL - Abnormal; Notable for  the following:    Clue Cells Wet Prep HPF POC FEW (*)    WBC, Wet Prep HPF POC MODERATE (*)    All other components within normal limits  CBC WITH DIFFERENTIAL - Abnormal; Notable for the following:    Platelets 139 (*)    Monocytes Relative 14 (*)    All other components within normal limits  COMPREHENSIVE METABOLIC PANEL - Abnormal; Notable for the following:    Total Protein 8.6 (*)    Total Bilirubin 0.2 (*)    Anion gap 18 (*)    All other components within normal limits  GC/CHLAMYDIA PROBE AMP  URINALYSIS, ROUTINE W REFLEX MICROSCOPIC  PREGNANCY, URINE  LIPASE, BLOOD  I-STAT CG4 LACTIC ACID, ED    Imaging Review US Abdomen Complete  01/20/2014   CLINICAL DATA:  Abdominal pain, worse in the left flank. Patient is 23 years old.  EXAM: ULTRASOUND ABDOMEN COMPLETE  COMPARISON:  None.  FINDINGS: Gallbladder:  There are a few gallstones layering within the gallbladder lumen. One of the larger stones is 6.5 mm. Gallbladder wall thickness is normal. Sonographic Murphy sign is negative.  Common bile duct:  Diameter: 2.4 mm.  Liver:  No focal lesion identified. Within normal limits in parenchymal echogenicity. Portal vein is patent.  IVC:  No abnormality visualized.  Pancreas:  Visualized portion unremarkable.  Spleen:  Size and appearance within normal limits. Measures 9.7 cm sagittal length.  Right Kidney:  Length: 11.2 cm. Echogenicity within normal limits. No mass or hydronephrosis visualized.  Left Kidney:  Length: 11.2 cm. Echogenicity within normal limits. No mass or hydronephrosis visualized.  Abdominal aorta:  No aneurysm visualized.  Other findings:  None.  IMPRESSION: 1. Cholelithiasis. Gallbladder wall thickness is normal and the sonographic Murphy's sign is negative. 2. Otherwise, the exam is within normal limits.   Electronically Signed   By: Britta Mccreedy M.D.   On: 01/20/2014 18:36   US Transvaginal Non-ob  01/20/2014   CLINICAL DATA:  Left adnexal tenderness.  Question ovarian  torsion.  EXAM: TRANSABDOMINAL AND TRANSVAGINAL ULTRASOUND OF PELVIS  DOPPLER ULTRASOUND OF OVARIES  TECHNIQUE: Both transabdominal and transvaginal ultrasound examinations of the pelvis were performed. Transabdominal technique was performed for global imaging of the pelvis including uterus, ovaries, adnexal regions, and pelvic cul-de-sac.  It was necessary to proceed with endovaginal exam following the transabdominal exam to visualize the adnexa. Color and duplex Doppler ultrasound was utilized to evaluate blood flow to the ovaries.  COMPARISON:  Pelvic ultrasound 12/31/2013.  FINDINGS: Uterus  Measurements: 8.1 x 4.1 x 4.4 cm. No fibroids or other mass visualized.  Endometrium  Thickness: 0.5 cm.  No focal  abnormality visualized.  Right ovary  Measurements: 3.0 x 2.1 x 2.2 cm. Normal appearance/no adnexal mass.  Left ovary  Measurements: 3.4 x 2.6 x 3.0 cm. A cyst measuring 2.0 x 2.0 x 2.1 cm identified with internal echoes. There is no mural nodule or internal flow.  Pulsed Doppler evaluation of both ovaries demonstrates normal low-resistance arterial and venous waveforms.  Other findings  Trace amount of free pelvic fluid is seen.  IMPRESSION: Negative for ovarian torsion.  No acute abnormality.  Small left ovarian cyst with internal echoes is likely a hemorrhagic cyst. No followup imaging is recommended.   Electronically Signed   By: Drusilla Kanner M.D.   On: 01/20/2014 18:36   US Pelvis Complete  01/20/2014   CLINICAL DATA:  Left adnexal tenderness.  Question ovarian torsion.  EXAM: TRANSABDOMINAL AND TRANSVAGINAL ULTRASOUND OF PELVIS  DOPPLER ULTRASOUND OF OVARIES  TECHNIQUE: Both transabdominal and transvaginal ultrasound examinations of the pelvis were performed. Transabdominal technique was performed for global imaging of the pelvis including uterus, ovaries, adnexal regions, and pelvic cul-de-sac.  It was necessary to proceed with endovaginal exam following the transabdominal exam to visualize the  adnexa. Color and duplex Doppler ultrasound was utilized to evaluate blood flow to the ovaries.  COMPARISON:  Pelvic ultrasound 12/31/2013.  FINDINGS: Uterus  Measurements: 8.1 x 4.1 x 4.4 cm. No fibroids or other mass visualized.  Endometrium  Thickness: 0.5 cm.  No focal abnormality visualized.  Right ovary  Measurements: 3.0 x 2.1 x 2.2 cm. Normal appearance/no adnexal mass.  Left ovary  Measurements: 3.4 x 2.6 x 3.0 cm. A cyst measuring 2.0 x 2.0 x 2.1 cm identified with internal echoes. There is no mural nodule or internal flow.  Pulsed Doppler evaluation of both ovaries demonstrates normal low-resistance arterial and venous waveforms.  Other findings  Trace amount of free pelvic fluid is seen.  IMPRESSION: Negative for ovarian torsion.  No acute abnormality.  Small left ovarian cyst with internal echoes is likely a hemorrhagic cyst. No followup imaging is recommended.   Electronically Signed   By: Drusilla Kanner M.D.   On: 01/20/2014 18:36   [place] Art/ven Flow Abd Pelv Doppler  01/20/2014   CLINICAL DATA:  Left adnexal tenderness.  Question ovarian torsion.  EXAM: TRANSABDOMINAL AND TRANSVAGINAL ULTRASOUND OF PELVIS  DOPPLER ULTRASOUND OF OVARIES  TECHNIQUE: Both transabdominal and transvaginal ultrasound examinations of the pelvis were performed. Transabdominal technique was performed for global imaging of the pelvis including uterus, ovaries, adnexal regions, and pelvic cul-de-sac.  It was necessary to proceed with endovaginal exam following the transabdominal exam to visualize the adnexa. Color and duplex Doppler ultrasound was utilized to evaluate blood flow to the ovaries.  COMPARISON:  Pelvic ultrasound 12/31/2013.  FINDINGS: Uterus  Measurements: 8.1 x 4.1 x 4.4 cm. No fibroids or other mass visualized.  Endometrium  Thickness: 0.5 cm.  No focal abnormality visualized.  Right ovary  Measurements: 3.0 x 2.1 x 2.2 cm. Normal appearance/no adnexal mass.  Left ovary  Measurements: 3.4 x 2.6 x 3.0 cm. A  cyst measuring 2.0 x 2.0 x 2.1 cm identified with internal echoes. There is no mural nodule or internal flow.  Pulsed Doppler evaluation of both ovaries demonstrates normal low-resistance arterial and venous waveforms.  Other findings  Trace amount of free pelvic fluid is seen.  IMPRESSION: Negative for ovarian torsion.  No acute abnormality.  Small left ovarian cyst with internal echoes is likely a hemorrhagic cyst. No followup imaging is recommended.  Electronically Signed   By: Drusilla Kannerhomas  Dalessio M.D.   On: 01/20/2014 18:36     EKG Interpretation None      MDM   Final diagnoses:  None    Albertina ParrShakilla Edman is a 23 y.o. female here with ab pain, myalgias. Likely viral syndrome. Since she has L ovarian cyst, will repeat US to r/o torsion. Also consider UTI vs stone. Will get renal US. Will hydrate and get labs and reassess.   7:47 PM WBC nl. US showed no torsion, just hemorrhagic cyst. Also has BV and will give flagyl. In addition, fever likely from viral syndrome. Recommend tylenol, motrin. Will give tramadol for severe pain.      Richardean Canalavid H Nayda Riesen, MD 01/20/14 1948  Richardean Canalavid H Nicodemus Denk, MD 01/20/14 [id number]

## 2014-01-20 NOTE — ED Notes (Signed)
Pt. Stated, I've been feeling bad all over , Im not eating , Im running a fever, this has been going on for a week.

## 2014-01-20 NOTE — Discharge Instructions (Signed)
Take flagyl for a week.   Take tylenol, motrin for fever.   Take tramadol for severe pain. Do NOT drive with it.   Follow up with GYN regarding your cysts.   Return to ER if you have fever, vomiting, severe pain.

## 2014-01-20 NOTE — ED Notes (Signed)
I Stat Lactic Acid results shown to Dr. D. Yao 

## 2014-01-20 NOTE — ED Notes (Signed)
Patient transported to Ultrasound 

## 2014-01-21 LAB — GC/CHLAMYDIA PROBE AMP
CT Probe RNA: NEGATIVE
GC Probe RNA: NEGATIVE

## 2014-02-25 ENCOUNTER — Encounter (HOSPITAL_COMMUNITY): Payer: Self-pay | Admitting: Emergency Medicine

## 2014-02-25 ENCOUNTER — Emergency Department (HOSPITAL_COMMUNITY)
Admission: EM | Admit: 2014-02-25 | Discharge: 2014-02-25 | Disposition: A | Discharge status: Home / Self Care | Payer: Medicaid Other | Attending: Emergency Medicine | Admitting: Emergency Medicine

## 2014-02-25 DIAGNOSIS — R1032 Left lower quadrant pain: Secondary | ICD-10-CM | POA: Insufficient documentation

## 2014-02-25 DIAGNOSIS — N730 Acute parametritis and pelvic cellulitis: Secondary | ICD-10-CM

## 2014-02-25 DIAGNOSIS — N739 Female pelvic inflammatory disease, unspecified: Secondary | ICD-10-CM | POA: Diagnosis not present

## 2014-02-25 DIAGNOSIS — N390 Urinary tract infection, site not specified: Secondary | ICD-10-CM | POA: Insufficient documentation

## 2014-02-25 DIAGNOSIS — Z8669 Personal history of other diseases of the nervous system and sense organs: Secondary | ICD-10-CM | POA: Insufficient documentation

## 2014-02-25 DIAGNOSIS — Z872 Personal history of diseases of the skin and subcutaneous tissue: Secondary | ICD-10-CM | POA: Insufficient documentation

## 2014-02-25 DIAGNOSIS — Z8619 Personal history of other infectious and parasitic diseases: Secondary | ICD-10-CM | POA: Diagnosis not present

## 2014-02-25 DIAGNOSIS — L905 Scar conditions and fibrosis of skin: Secondary | ICD-10-CM | POA: Diagnosis not present

## 2014-02-25 DIAGNOSIS — Z3202 Encounter for pregnancy test, result negative: Secondary | ICD-10-CM | POA: Insufficient documentation

## 2014-02-25 DIAGNOSIS — Z8659 Personal history of other mental and behavioral disorders: Secondary | ICD-10-CM | POA: Diagnosis not present

## 2014-02-25 DIAGNOSIS — Z862 Personal history of diseases of the blood and blood-forming organs and certain disorders involving the immune mechanism: Secondary | ICD-10-CM | POA: Insufficient documentation

## 2014-02-25 LAB — HEPATIC FUNCTION PANEL
ALT: 11 U/L (ref 0–35)
AST: 13 U/L (ref 0–37)
Albumin: 4.2 g/dL (ref 3.5–5.2)
Alkaline Phosphatase: 68 U/L (ref 39–117)
Bilirubin, Direct: <0.2 mg/dL (ref 0.0–0.3)
Total Bilirubin: <0.2 mg/dL — ABNORMAL LOW (ref 0.3–1.2)
Total Protein: 8.4 g/dL — ABNORMAL HIGH (ref 6.0–8.3)

## 2014-02-25 LAB — URINALYSIS, ROUTINE W REFLEX MICROSCOPIC
Bilirubin Urine: NEGATIVE
Glucose, UA: NEGATIVE mg/dL
Hgb urine dipstick: NEGATIVE
Ketones, ur: NEGATIVE mg/dL
Nitrite: NEGATIVE
Protein, ur: NEGATIVE mg/dL
Specific Gravity, Urine: 1.028 (ref 1.005–1.030)
Urobilinogen, UA: 1 mg/dL (ref 0.0–1.0)
pH: 6.5 (ref 5.0–8.0)

## 2014-02-25 LAB — URINE MICROSCOPIC-ADD ON

## 2014-02-25 LAB — BASIC METABOLIC PANEL
Anion gap: 12 (ref 5–15)
BUN: 16 mg/dL (ref 6–23)
CO2: 23 mEq/L (ref 19–32)
Calcium: 9.8 mg/dL (ref 8.4–10.5)
Chloride: 106 mEq/L (ref 96–112)
Creatinine, Ser: 0.84 mg/dL (ref 0.50–1.10)
GFR calc Af Amer: >90 mL/min (ref >90)
GFR calc non Af Amer: >90 mL/min (ref >90)
Glucose, Bld: 86 mg/dL (ref 70–99)
Potassium: 3.8 mEq/L (ref 3.7–5.3)
Sodium: 141 mEq/L (ref 137–147)

## 2014-02-25 LAB — CBC WITH DIFFERENTIAL/PLATELET
Basophils Absolute: 0 10*3/uL (ref 0.0–0.1)
Basophils Relative: 0 % (ref 0–1)
Eosinophils Absolute: 0.1 10*3/uL (ref 0.0–0.7)
Eosinophils Relative: 1 % (ref 0–5)
HCT: 37.5 % (ref 36.0–46.0)
Hemoglobin: 12.3 g/dL (ref 12.0–15.0)
Lymphocytes Relative: 24 % (ref 12–46)
Lymphs Abs: 1.7 10*3/uL (ref 0.7–4.0)
MCH: 26.9 pg (ref 26.0–34.0)
MCHC: 32.8 g/dL (ref 30.0–36.0)
MCV: 81.9 fL (ref 78.0–100.0)
Monocytes Absolute: 0.5 10*3/uL (ref 0.1–1.0)
Monocytes Relative: 8 % (ref 3–12)
Neutro Abs: 4.6 10*3/uL (ref 1.7–7.7)
Neutrophils Relative %: 67 % (ref 43–77)
Platelets: 196 10*3/uL (ref 150–400)
RBC: 4.58 MIL/uL (ref 3.87–5.11)
RDW: 13.9 % (ref 11.5–15.5)
WBC: 6.9 10*3/uL (ref 4.0–10.5)

## 2014-02-25 LAB — PREGNANCY, URINE: Preg Test, Ur: NEGATIVE

## 2014-02-25 LAB — WET PREP, GENITAL
Clue Cells Wet Prep HPF POC: NONE SEEN
Trich, Wet Prep: NONE SEEN
Yeast Wet Prep HPF POC: NONE SEEN

## 2014-02-25 LAB — LIPASE, BLOOD: Lipase: 18 U/L (ref 11–59)

## 2014-02-25 MED ORDER — CEPHALEXIN 500 MG PO CAPS: 500.0000 mg | ORAL_CAPSULE | Freq: Two times a day (BID) | ORAL | Status: DC

## 2014-02-25 MED ORDER — DIPHENHYDRAMINE HCL 50 MG/ML IJ SOLN
25.0000 mg | Freq: Once | INTRAMUSCULAR | Status: AC
  Administered 2014-02-25: 25 mg via INTRAVENOUS
  Filled 2014-02-25: qty 1

## 2014-02-25 MED ORDER — METRONIDAZOLE 500 MG PO TABS
500.0000 mg | ORAL_TABLET | Freq: Once | ORAL | Status: AC
  Administered 2014-02-25: 500 mg via ORAL
  Filled 2014-02-25: qty 1

## 2014-02-25 MED ORDER — DOXYCYCLINE HYCLATE 100 MG PO CAPS: 100.0000 mg | ORAL_CAPSULE | Freq: Two times a day (BID) | ORAL | Status: DC

## 2014-02-25 MED ORDER — DOXYCYCLINE HYCLATE 100 MG PO TABS
100.0000 mg | ORAL_TABLET | Freq: Once | ORAL | Status: AC
  Administered 2014-02-25: 100 mg via ORAL
  Filled 2014-02-25: qty 1

## 2014-02-25 MED ORDER — DEXTROSE 5 % IV SOLN
1.0000 g | Freq: Once | INTRAVENOUS | Status: AC
  Administered 2014-02-25: 1 g via INTRAVENOUS
  Filled 2014-02-25: qty 10

## 2014-02-25 MED ORDER — SODIUM CHLORIDE 0.9 % IV BOLUS (SEPSIS)
1000.0000 mL | Freq: Once | INTRAVENOUS | Status: AC
  Administered 2014-02-25: 1000 mL via INTRAVENOUS

## 2014-02-25 MED ORDER — METOCLOPRAMIDE HCL 5 MG/ML IJ SOLN
10.0000 mg | Freq: Once | INTRAMUSCULAR | Status: AC
  Administered 2014-02-25: 10 mg via INTRAVENOUS
  Filled 2014-02-25: qty 2

## 2014-02-25 MED ORDER — METRONIDAZOLE 500 MG PO TABS: 500.0000 mg | ORAL_TABLET | Freq: Two times a day (BID) | ORAL | Status: DC

## 2014-02-25 NOTE — ED Notes (Signed)
Pt is aware of the need for urine, ambulated to the restroom in attempt to collect sample.
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
## 2014-02-25 NOTE — Discharge Instructions (Signed)
°  Take your antibiotics as directed and to completion. You should never have any leftover antibiotics! Push fluids and stay well hydrated.  ° °Do not drink alcohol while you are taking flagyl (metronidazole) because it will make you very sick. ° °Please follow with your primary care doctor in the next 2 days for a check-up. They must obtain records for further management.  ° °Do not hesitate to return to the Emergency Department for any new, worsening or concerning symptoms.  ° ° °

## 2014-02-25 NOTE — ED Notes (Signed)
Pt c/o abd pain, vaginal itching, odor, burning and d/c x 2 weeks. Pt also c/o migraine x 2 days.

## 2014-02-26 LAB — GC/CHLAMYDIA PROBE AMP
CT Probe RNA: NEGATIVE
GC Probe RNA: NEGATIVE

## 2014-02-26 LAB — HIV ANTIBODY (ROUTINE TESTING W REFLEX): HIV 1&2 Ab, 4th Generation: NONREACTIVE

## 2014-02-26 LAB — RPR

## 2014-02-27 NOTE — ED Provider Notes (Signed)
Medical screening examination/treatment/procedure(s) were performed by non-physician practitioner and as supervising physician I was immediately available for consultation/collaboration.   EKG Interpretation None       Shaurya Rawdon, MD 02/27/14 1417 

## 2014-03-04 ENCOUNTER — Emergency Department (HOSPITAL_COMMUNITY): Payer: Medicaid Other

## 2014-03-04 ENCOUNTER — Encounter (HOSPITAL_COMMUNITY): Payer: Self-pay | Admitting: *Deleted

## 2014-03-04 ENCOUNTER — Inpatient Hospital Stay (EMERGENCY_DEPARTMENT_HOSPITAL)
Admission: AD | Admit: 2014-03-04 | Discharge: 2014-03-04 | Disposition: A | Discharge status: Home / Self Care | Payer: Medicaid Other | Source: Ambulatory Visit | Attending: Obstetrics and Gynecology | Admitting: Obstetrics and Gynecology

## 2014-03-04 ENCOUNTER — Emergency Department (HOSPITAL_COMMUNITY)
Admission: EM | Admit: 2014-03-04 | Discharge: 2014-03-05 | Disposition: A | Discharge status: Home / Self Care | Payer: Medicaid Other | Attending: Emergency Medicine | Admitting: Emergency Medicine

## 2014-03-04 ENCOUNTER — Encounter (HOSPITAL_COMMUNITY): Payer: Self-pay | Admitting: Emergency Medicine

## 2014-03-04 DIAGNOSIS — R569 Unspecified convulsions: Secondary | ICD-10-CM | POA: Insufficient documentation

## 2014-03-04 DIAGNOSIS — G43909 Migraine, unspecified, not intractable, without status migrainosus: Secondary | ICD-10-CM | POA: Diagnosis not present

## 2014-03-04 DIAGNOSIS — Z8669 Personal history of other diseases of the nervous system and sense organs: Secondary | ICD-10-CM | POA: Insufficient documentation

## 2014-03-04 DIAGNOSIS — Z8619 Personal history of other infectious and parasitic diseases: Secondary | ICD-10-CM | POA: Insufficient documentation

## 2014-03-04 DIAGNOSIS — S199XXA Unspecified injury of neck, initial encounter: Secondary | ICD-10-CM

## 2014-03-04 DIAGNOSIS — S0990XA Unspecified injury of head, initial encounter: Secondary | ICD-10-CM | POA: Diagnosis not present

## 2014-03-04 DIAGNOSIS — Z87440 Personal history of urinary (tract) infections: Secondary | ICD-10-CM | POA: Diagnosis not present

## 2014-03-04 DIAGNOSIS — S0993XA Unspecified injury of face, initial encounter: Secondary | ICD-10-CM | POA: Insufficient documentation

## 2014-03-04 DIAGNOSIS — Z872 Personal history of diseases of the skin and subcutaneous tissue: Secondary | ICD-10-CM | POA: Insufficient documentation

## 2014-03-04 DIAGNOSIS — Z862 Personal history of diseases of the blood and blood-forming organs and certain disorders involving the immune mechanism: Secondary | ICD-10-CM | POA: Diagnosis not present

## 2014-03-04 DIAGNOSIS — Z8659 Personal history of other mental and behavioral disorders: Secondary | ICD-10-CM | POA: Insufficient documentation

## 2014-03-04 DIAGNOSIS — Z792 Long term (current) use of antibiotics: Secondary | ICD-10-CM | POA: Diagnosis not present

## 2014-03-04 DIAGNOSIS — R109 Unspecified abdominal pain: Secondary | ICD-10-CM

## 2014-03-04 LAB — RAPID URINE DRUG SCREEN, HOSP PERFORMED
Amphetamines: NOT DETECTED
Amphetamines: NOT DETECTED
Barbiturates: NOT DETECTED
Barbiturates: NOT DETECTED
Benzodiazepines: NOT DETECTED
Benzodiazepines: NOT DETECTED
Cocaine: NOT DETECTED
Cocaine: NOT DETECTED
Opiates: NOT DETECTED
Opiates: NOT DETECTED
Tetrahydrocannabinol: NOT DETECTED
Tetrahydrocannabinol: NOT DETECTED

## 2014-03-04 LAB — HEPATIC FUNCTION PANEL
ALT: 13 U/L (ref 0–35)
AST: 17 U/L (ref 0–37)
Albumin: 4 g/dL (ref 3.5–5.2)
Alkaline Phosphatase: 57 U/L (ref 39–117)
Bilirubin, Direct: <0.2 mg/dL (ref 0.0–0.3)
Total Bilirubin: 0.4 mg/dL (ref 0.3–1.2)
Total Protein: 7.8 g/dL (ref 6.0–8.3)

## 2014-03-04 LAB — CBC WITH DIFFERENTIAL/PLATELET
Basophils Absolute: 0 10*3/uL (ref 0.0–0.1)
Basophils Relative: 0 % (ref 0–1)
Eosinophils Absolute: 0 10*3/uL (ref 0.0–0.7)
Eosinophils Relative: 1 % (ref 0–5)
HCT: 34.3 % — ABNORMAL LOW (ref 36.0–46.0)
Hemoglobin: 11.4 g/dL — ABNORMAL LOW (ref 12.0–15.0)
Lymphocytes Relative: 17 % (ref 12–46)
Lymphs Abs: 1 10*3/uL (ref 0.7–4.0)
MCH: 27 pg (ref 26.0–34.0)
MCHC: 33.2 g/dL (ref 30.0–36.0)
MCV: 81.3 fL (ref 78.0–100.0)
Monocytes Absolute: 0.5 10*3/uL (ref 0.1–1.0)
Monocytes Relative: 8 % (ref 3–12)
Neutro Abs: 4.6 10*3/uL (ref 1.7–7.7)
Neutrophils Relative %: 74 % (ref 43–77)
Platelets: 166 10*3/uL (ref 150–400)
RBC: 4.22 MIL/uL (ref 3.87–5.11)
RDW: 14.5 % (ref 11.5–15.5)
WBC: 6.1 10*3/uL (ref 4.0–10.5)

## 2014-03-04 LAB — URINALYSIS, ROUTINE W REFLEX MICROSCOPIC
Bilirubin Urine: NEGATIVE
Glucose, UA: NEGATIVE mg/dL
Ketones, ur: NEGATIVE mg/dL
Nitrite: POSITIVE — AB
Protein, ur: NEGATIVE mg/dL
Specific Gravity, Urine: 1.02 (ref 1.005–1.030)
Urobilinogen, UA: 0.2 mg/dL (ref 0.0–1.0)
pH: 6 (ref 5.0–8.0)

## 2014-03-04 LAB — I-STAT CHEM 8, ED
BUN: 14 mg/dL (ref 6–23)
Calcium, Ion: 1.12 mmol/L (ref 1.12–1.23)
Chloride: 108 mEq/L (ref 96–112)
Creatinine, Ser: 0.9 mg/dL (ref 0.50–1.10)
Glucose, Bld: 96 mg/dL (ref 70–99)
HCT: 39 % (ref 36.0–46.0)
Hemoglobin: 13.3 g/dL (ref 12.0–15.0)
Potassium: 3.3 mEq/L — ABNORMAL LOW (ref 3.7–5.3)
Sodium: 138 mEq/L (ref 137–147)
TCO2: 23 mmol/L (ref 0–100)

## 2014-03-04 LAB — BASIC METABOLIC PANEL
Anion gap: 15 (ref 5–15)
BUN: 14 mg/dL (ref 6–23)
CO2: 21 mEq/L (ref 19–32)
Calcium: 9 mg/dL (ref 8.4–10.5)
Chloride: 103 mEq/L (ref 96–112)
Creatinine, Ser: 0.84 mg/dL (ref 0.50–1.10)
GFR calc Af Amer: >90 mL/min (ref >90)
GFR calc non Af Amer: >90 mL/min (ref >90)
Glucose, Bld: 94 mg/dL (ref 70–99)
Potassium: 3.5 mEq/L — ABNORMAL LOW (ref 3.7–5.3)
Sodium: 139 mEq/L (ref 137–147)

## 2014-03-04 LAB — CBG MONITORING, ED: Glucose-Capillary: 101 mg/dL — ABNORMAL HIGH (ref 70–99)

## 2014-03-04 LAB — URINE MICROSCOPIC-ADD ON

## 2014-03-04 LAB — ETHANOL: Alcohol, Ethyl (B): <11 mg/dL (ref 0–11)

## 2014-03-04 LAB — POCT PREGNANCY, URINE: Preg Test, Ur: NEGATIVE

## 2014-03-04 LAB — HCG, QUANTITATIVE, PREGNANCY: hCG, Beta Chain, Quant, S: <1 m[IU]/mL (ref <5)

## 2014-03-04 MED ORDER — LEVETIRACETAM 500 MG PO TABS
500.0000 mg | ORAL_TABLET | Freq: Once | ORAL | Status: AC
  Administered 2014-03-05: 500 mg via ORAL
  Filled 2014-03-04: qty 1

## 2014-03-04 MED ORDER — LEVETIRACETAM 500 MG PO TABS: 500.0000 mg | ORAL_TABLET | Freq: Two times a day (BID) | ORAL | Status: DC

## 2014-03-04 MED ORDER — DOXYCYCLINE HYCLATE 100 MG PO CAPS: 100.0000 mg | ORAL_CAPSULE | Freq: Two times a day (BID) | ORAL | Status: DC

## 2014-03-04 MED ORDER — KETOROLAC TROMETHAMINE 60 MG/2ML IM SOLN
60.0000 mg | Freq: Once | INTRAMUSCULAR | Status: AC
  Administered 2014-03-04: 60 mg via INTRAMUSCULAR
  Filled 2014-03-04: qty 2

## 2014-03-04 NOTE — Discharge Instructions (Signed)
Abdominal Pain, Women °Abdominal (stomach, pelvic, or belly) pain can be caused by many things. It is important to tell your doctor: °· The location of the pain. °· Does it come and go or is it present all the time? °· Are there things that start the pain (eating certain foods, exercise)? °· Are there other symptoms associated with the pain (fever, nausea, vomiting, diarrhea)? °All of this is helpful to know when trying to find the cause of the pain. °CAUSES  °· Stomach: virus or bacteria infection, or ulcer. °· Intestine: appendicitis (inflamed appendix), regional ileitis (Crohn's disease), ulcerative colitis (inflamed colon), irritable bowel syndrome, diverticulitis (inflamed diverticulum of the colon), or cancer of the stomach or intestine. °· Gallbladder disease or stones in the gallbladder. °· Kidney disease, kidney stones, or infection. °· Pancreas infection or cancer. °· Fibromyalgia (pain disorder). °· Diseases of the female organs: °¨ Uterus: fibroid (non-cancerous) tumors or infection. °¨ Fallopian tubes: infection or tubal pregnancy. °¨ Ovary: cysts or tumors. °¨ Pelvic adhesions (scar tissue). °¨ Endometriosis (uterus lining tissue growing in the pelvis and on the pelvic organs). °¨ Pelvic congestion syndrome (female organs filling up with blood just before the menstrual period). °¨ Pain with the menstrual period. °¨ Pain with ovulation (producing an egg). °¨ Pain with an IUD (intrauterine device, birth control) in the uterus. °¨ Cancer of the female organs. °· Functional pain (pain not caused by a disease, may improve without treatment). °· Psychological pain. °· Depression. °DIAGNOSIS  °Your doctor will decide the seriousness of your pain by doing an examination. °· Blood tests. °· X-rays. °· Ultrasound. °· CT scan (computed tomography, special type of X-ray). °· MRI (magnetic resonance imaging). °· Cultures, for infection. °· Barium enema (dye inserted in the large intestine, to better view it with  X-rays). °· Colonoscopy (looking in intestine with a lighted tube). °· Laparoscopy (minor surgery, looking in abdomen with a lighted tube). °· Major abdominal exploratory surgery (looking in abdomen with a large incision). °TREATMENT  °The treatment will depend on the cause of the pain.  °· Many cases can be observed and treated at home. °· Over-the-counter medicines recommended by your caregiver. °· Prescription medicine. °· Antibiotics, for infection. °· Birth control pills, for painful periods or for ovulation pain. °· Hormone treatment, for endometriosis. °· Nerve blocking injections. °· Physical therapy. °· Antidepressants. °· Counseling with a psychologist or psychiatrist. °· Minor or major surgery. °HOME CARE INSTRUCTIONS  °· Do not take laxatives, unless directed by your caregiver. °· Take over-the-counter pain medicine only if ordered by your caregiver. Do not take aspirin because it can cause an upset stomach or bleeding. °· Try a clear liquid diet (broth or water) as ordered by your caregiver. Slowly move to a bland diet, as tolerated, if the pain is related to the stomach or intestine. °· Have a thermometer and take your temperature several times a day, and record it. °· Bed rest and sleep, if it helps the pain. °· Avoid sexual intercourse, if it causes pain. °· Avoid stressful situations. °· Keep your follow-up appointments and tests, as your caregiver orders. °· If the pain does not go away with medicine or surgery, you may try: °¨ Acupuncture. °¨ Relaxation exercises (yoga, meditation). °¨ Group therapy. °¨ Counseling. °SEEK MEDICAL CARE IF:  °· You notice certain foods cause stomach pain. °· Your home care treatment is not helping your pain. °· You need stronger pain medicine. °· You want your IUD removed. °· You feel faint or   lightheaded. °· You develop nausea and vomiting. °· You develop a rash. °· You are having side effects or an allergy to your medicine. °SEEK IMMEDIATE MEDICAL CARE IF:  °· Your  pain does not go away or gets worse. °· You have a fever. °· Your pain is felt only in portions of the abdomen. The right side could possibly be appendicitis. The left lower portion of the abdomen could be colitis or diverticulitis. °· You are passing blood in your stools (bright red or black tarry stools, with or without vomiting). °· You have blood in your urine. °· You develop chills, with or without a fever. °· You pass out. °MAKE SURE YOU:  °· Understand these instructions. °· Will watch your condition. °· Will get help right away if you are not doing well or get worse. °Document Released: 04/25/2007 Document Revised: 11/12/2013 Document Reviewed: 05/15/2009 °Exit[employer]® Patient Information ©2015 [employer]. This information is not intended to replace advice given to you by your health care provider. Make sure you discuss any questions you have with your health care provider. ° °
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
## 2014-03-04 NOTE — ED Notes (Addendum)
The patient has three dollars for medication and T-shirt and Cami [employer] Bra on (the techcut). The RN is aware.
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
## 2014-03-04 NOTE — MAU Provider Note (Signed)
History     CSN: [id number]  Arrival date and time: 03/04/14 1252   None     Chief Complaint  Patient presents with  . Abdominal Pain   HPI  [name] is a 23 y.o. female [phone] who presents with abdominal pain and a positive home pregnancy test. The patient was seen at [facility] ED on 8/17 and diagnosed with PID and UTI. The patient was unable to fill her RX of doxycycline and has not taken it. She feels the pain is exactly the same as it was when she was seen at [facility] ED on 8/17.  OB History   Grav Para Term Preterm Abortions TAB SAB Ect Mult Living   0 1 1 0 0 0 2      Past Medical History  Diagnosis Date  . Anemia   . Depression   . Complication of anesthesia     ??, seizure with wisdom teeth  . Urinary tract infection   . Psoriasis   . PID (acute pelvic inflammatory disease)   . Chlamydia   . PONV (postoperative nausea and vomiting)   . Seizures     July 2013 - [place]  . Headache(784.0)     otc med prn  . Anxiety   . Migraine     Past Surgical History  Procedure Laterality Date  . Cesarean section    . Skin graft      off abd, onto arm  . Wisdom tooth extraction    . Cesarean section  06/06/2012    Procedure: CESAREAN SECTION;  Surgeon: Adam Phenix, MD;  Location: [facility] ORS;  Service: Obstetrics;  Laterality: N/A;    Family History  Problem Relation Age of Onset  . Hypertension Mother   . Diabetes Mother   . Cancer Mother     BREAST  . Stroke Mother   . Seizures Mother   . Asthma Daughter   . Hypertension Maternal Grandmother   . Diabetes Maternal Grandmother   . Cancer Maternal Grandmother     BONE CANCER  . Other Neg Hx     History  Substance Use Topics  . Smoking status: Never Smoker   . Smokeless tobacco: Never Used  . Alcohol Use: Yes     Comment: socially but none with pregnancy    Allergies: No Known Allergies  Prescriptions prior to admission  Medication Sig Dispense Refill  . cephALEXin (KEFLEX) 500  MG capsule Take 1 capsule (500 mg total) by mouth 2 (two) times daily.  20 capsule  0   Results for orders placed during the hospital encounter of 03/04/14 (from the past 48 hour(s))  URINALYSIS, ROUTINE W REFLEX MICROSCOPIC     Status: Abnormal   Collection Time    03/04/14  1:04 PM      Result Value Ref Range   Color, Urine YELLOW  YELLOW   APPearance HAZY (*) CLEAR   Specific Gravity, Urine 1.020  1.005 - 1.030   pH 6.0  5.0 - 8.0   Glucose, UA NEGATIVE  NEGATIVE mg/dL   Hgb urine dipstick LARGE (*) NEGATIVE   Bilirubin Urine NEGATIVE  NEGATIVE   Ketones, ur NEGATIVE  NEGATIVE mg/dL   Protein, ur NEGATIVE  NEGATIVE mg/dL   Urobilinogen, UA 0.2  0.0 - 1.0 mg/dL   Nitrite POSITIVE (*) NEGATIVE   Leukocytes, UA SMALL (*) NEGATIVE  URINE MICROSCOPIC-ADD ON     Status: Abnormal   Collection Time  03/04/14  1:04 PM      Result Value Ref Range   Squamous Epithelial / LPF FEW (*) RARE   WBC, UA 0-2  <3 WBC/hpf   RBC / HPF TOO NUMEROUS TO COUNT  <3 RBC/hpf   Bacteria, UA FEW (*) RARE  POCT PREGNANCY, URINE     Status: None   Collection Time    03/04/14  1:07 PM      Result Value Ref Range   Preg Test, Ur NEGATIVE  NEGATIVE   Comment:            THE SENSITIVITY OF THIS     METHODOLOGY IS >24 mIU/mL  HCG, QUANTITATIVE, PREGNANCY     Status: None   Collection Time    03/04/14  1:25 PM      Result Value Ref Range   hCG, Beta Chain, Quant, S <1  <5 mIU/mL   Comment:              GEST. AGE      CONC.  (mIU/mL)       <=1 WEEK        5 - 50         2 WEEKS       50 - 500         3 WEEKS       100 - 10,000         4 WEEKS     1,000 - 30,000         5 WEEKS     3,500 - 115,000       6-8 WEEKS     12,000 - 270,000        12 WEEKS     15,000 - 220,000                FEMALE AND NON-PREGNANT FEMALE:         LESS THAN 5 mIU/mL     REPEATED TO VERIFY    Review of Systems  Gastrointestinal: Positive for abdominal pain (Bilateral lower abdominal pain ). Negative for nausea, vomiting,  diarrhea and constipation.  Genitourinary: Positive for dysuria, urgency and frequency.   Physical Exam   Blood pressure 127/89, pulse 112, temperature 97.5 F (36.4 C), temperature source Oral, resp. rate 18, last menstrual period 01/11/2014, unknown if currently breastfeeding.  Physical Exam  Constitutional: She is oriented to person, place, and time. Vital signs are normal. She appears well-developed and well-nourished.  Non-toxic appearance. She does not have a sickly appearance. She does not appear ill. No distress.  HENT:  Head: Normocephalic.  Respiratory: Effort normal.  GI: Soft. She exhibits no distension. There is no tenderness. There is no rebound and no guarding.  Musculoskeletal: Normal range of motion.  Neurological: She is alert and oriented to person, place, and time.  Skin: Skin is warm. She is not diaphoretic.  Psychiatric: Her behavior is normal.    MAU Course  Procedures None  MDM  Toradol 60 mg IM  Beta Hcg <1 UA   Assessment and Plan   A:  Abdominal pain in female   P:  Fill Doxycycline ASAP  Continue keflex for UTI  Patient misplaced doxycycline RX; I printed another for the patient Ok to take ibuprofen as needed, as directed on the bottle  Condoms always   Iona Hansen Kerri-Anne Haeberle, NP 03/04/2014, 5:28 PM

## 2014-03-04 NOTE — ED Notes (Signed)
C-collar applied

## 2014-03-04 NOTE — Discharge Instructions (Signed)
Please follow with your neurologist as soon as possible.  Take your antibiotics and seizure medication as directed.  Please follow with your primary care doctor in the next 2 days for a check-up. They must obtain records for further management.   Do not hesitate to return to the Emergency Department for any new, worsening or concerning symptoms.    Seizure, Adult A seizure is abnormal electrical activity in the brain. Seizures usually last from 30 seconds to 2 minutes. There are various types of seizures. Before a seizure, you may have a warning sensation (aura) that a seizure is about to occur. An aura may include the following symptoms:   Fear or anxiety.  Nausea.  Feeling like the room is spinning (vertigo).  Vision changes, such as seeing flashing lights or spots. Common symptoms during a seizure include:  A change in attention or behavior (altered mental status).  Convulsions with rhythmic jerking movements.  Drooling.  Rapid eye movements.  Grunting.  Loss of bladder and bowel control.  Bitter taste in the mouth.  Tongue biting. After a seizure, you may feel confused and sleepy. You may also have an injury resulting from convulsions during the seizure. HOME CARE INSTRUCTIONS   If you are given medicines, take them exactly as prescribed by your health care provider.  Keep all follow-up appointments as directed by your health care provider.  Do not swim or drive or engage in risky activity during which a seizure could cause further injury to you or others until your health care provider says it is OK.  Get adequate rest.  Teach friends and family what to do if you have a seizure. They should:  Lay you on the ground to prevent a fall.  Put a cushion under your head.  Loosen any tight clothing around your neck.  Turn you on your side. If vomiting occurs, this helps keep your airway clear.  Stay with you until you recover.  Know whether or not you need  emergency care. SEEK IMMEDIATE MEDICAL CARE IF:  The seizure lasts longer than 5 minutes.  The seizure is severe or you do not wake up immediately after the seizure.  You have an altered mental status after the seizure.  You are having more frequent or worsening seizures. Someone should drive you to the emergency department or call local emergency services (911 in [place] MAKE SURE YOU:  Understand these instructions.  Will watch your condition.  Will get help right away if you are not doing well or get worse. Document Released: 06/25/2000 Document Revised: 04/18/2013 Document Reviewed: 02/07/2013 [facility] Patient Information 2015 [place], [place]. This information is not intended to replace advice given to you by your health care provider. Make sure you discuss any questions you have with your health care provider.

## 2014-03-04 NOTE — ED Notes (Signed)
Dr. Effie Shy at bedside, removed patient from back board and head blocks.

## 2014-03-04 NOTE — ED Notes (Signed)
The tech gave the three dollars to DJ (the patients middle brother) for her medication. The tech has reported to the RN in charge.
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
## 2014-03-04 NOTE — MAU Note (Signed)
Pt presents to MAU with complaints of pain in her lower abdomen and pain with pressure with urination. Pt states she was evaluated at [facility] ED about a week ago and was diagnoses with a UTI and PID. Pt reports a + HPT at home 3 days ago and is now having some vaginal bleeding

## 2014-03-04 NOTE — ED Notes (Signed)
The patient ambulated 5 ft and complained of left ankle pain. The tech has reported to RN in charge.

## 2014-03-05 LAB — URINE CULTURE
Colony Count: NO GROWTH
Culture: NO GROWTH

## 2014-03-05 NOTE — ED Notes (Signed)
Pt A&OX4, ambulatory at d/c with steady gait, NAD 

## 2014-03-05 NOTE — MAU Provider Note (Signed)
Attestation of Attending Supervision of Advanced Practitioner (CNM/NP): Evaluation and management procedures were performed by the Advanced Practitioner under my supervision and collaboration.  I have reviewed the Advanced Practitioner's note and chart, and I agree with the management and plan.  Murle Otting 03/05/2014 1:24 AM   

## 2014-03-07 LAB — LEVETIRACETAM LEVEL: Levetiracetam Lvl: <1 ug/mL

## 2014-04-01 ENCOUNTER — Encounter: Payer: Medicaid Other | Admitting: Obstetrics & Gynecology

## 2014-04-18 ENCOUNTER — Telehealth: Payer: Self-pay

## 2014-04-18 ENCOUNTER — Encounter: Payer: Medicaid Other | Admitting: Obstetrics & Gynecology

## 2014-04-18 NOTE — Telephone Encounter (Signed)
Patient missed appointment today. Attempted to call patient. No answer. Left message stating I'm sorry you missed your appointment today, please call clinic to reschedule.

## 2014-05-06 ENCOUNTER — Encounter (HOSPITAL_COMMUNITY): Payer: Self-pay | Admitting: Emergency Medicine

## 2014-05-06 ENCOUNTER — Emergency Department (HOSPITAL_COMMUNITY)
Admission: EM | Admit: 2014-05-06 | Discharge: 2014-05-06 | Disposition: A | Discharge status: Home / Self Care | Payer: Medicaid Other | Attending: Emergency Medicine | Admitting: Emergency Medicine

## 2014-05-06 DIAGNOSIS — Z862 Personal history of diseases of the blood and blood-forming organs and certain disorders involving the immune mechanism: Secondary | ICD-10-CM | POA: Insufficient documentation

## 2014-05-06 DIAGNOSIS — Z3202 Encounter for pregnancy test, result negative: Secondary | ICD-10-CM | POA: Diagnosis not present

## 2014-05-06 DIAGNOSIS — Z87440 Personal history of urinary (tract) infections: Secondary | ICD-10-CM | POA: Insufficient documentation

## 2014-05-06 DIAGNOSIS — Z8619 Personal history of other infectious and parasitic diseases: Secondary | ICD-10-CM | POA: Diagnosis not present

## 2014-05-06 DIAGNOSIS — F419 Anxiety disorder, unspecified: Secondary | ICD-10-CM | POA: Diagnosis not present

## 2014-05-06 DIAGNOSIS — Z8669 Personal history of other diseases of the nervous system and sense organs: Secondary | ICD-10-CM | POA: Diagnosis not present

## 2014-05-06 DIAGNOSIS — B9689 Other specified bacterial agents as the cause of diseases classified elsewhere: Secondary | ICD-10-CM

## 2014-05-06 DIAGNOSIS — Z202 Contact with and (suspected) exposure to infections with a predominantly sexual mode of transmission: Secondary | ICD-10-CM | POA: Diagnosis not present

## 2014-05-06 DIAGNOSIS — N760 Acute vaginitis: Secondary | ICD-10-CM | POA: Insufficient documentation

## 2014-05-06 DIAGNOSIS — Z872 Personal history of diseases of the skin and subcutaneous tissue: Secondary | ICD-10-CM | POA: Insufficient documentation

## 2014-05-06 DIAGNOSIS — F329 Major depressive disorder, single episode, unspecified: Secondary | ICD-10-CM | POA: Diagnosis not present

## 2014-05-06 DIAGNOSIS — Z8679 Personal history of other diseases of the circulatory system: Secondary | ICD-10-CM | POA: Insufficient documentation

## 2014-05-06 DIAGNOSIS — Z79899 Other long term (current) drug therapy: Secondary | ICD-10-CM | POA: Diagnosis not present

## 2014-05-06 DIAGNOSIS — R102 Pelvic and perineal pain: Secondary | ICD-10-CM

## 2014-05-06 DIAGNOSIS — R1030 Lower abdominal pain, unspecified: Secondary | ICD-10-CM | POA: Diagnosis present

## 2014-05-06 LAB — COMPREHENSIVE METABOLIC PANEL
ALT: 14 U/L (ref 0–35)
AST: 16 U/L (ref 0–37)
Albumin: 4.4 g/dL (ref 3.5–5.2)
Alkaline Phosphatase: 73 U/L (ref 39–117)
Anion gap: 16 — ABNORMAL HIGH (ref 5–15)
BUN: 12 mg/dL (ref 6–23)
CO2: 22 mEq/L (ref 19–32)
Calcium: 9.8 mg/dL (ref 8.4–10.5)
Chloride: 99 mEq/L (ref 96–112)
Creatinine, Ser: 0.72 mg/dL (ref 0.50–1.10)
GFR calc Af Amer: >90 mL/min (ref >90)
GFR calc non Af Amer: >90 mL/min (ref >90)
Glucose, Bld: 72 mg/dL (ref 70–99)
Potassium: 4.1 mEq/L (ref 3.7–5.3)
Sodium: 137 mEq/L (ref 137–147)
Total Bilirubin: 0.5 mg/dL (ref 0.3–1.2)
Total Protein: 8.8 g/dL — ABNORMAL HIGH (ref 6.0–8.3)

## 2014-05-06 LAB — CBC WITH DIFFERENTIAL/PLATELET
Basophils Absolute: 0 10*3/uL (ref 0.0–0.1)
Basophils Relative: 0 % (ref 0–1)
Eosinophils Absolute: 0 10*3/uL (ref 0.0–0.7)
Eosinophils Relative: 0 % (ref 0–5)
HCT: 38.5 % (ref 36.0–46.0)
Hemoglobin: 13.2 g/dL (ref 12.0–15.0)
Lymphocytes Relative: 22 % (ref 12–46)
Lymphs Abs: 1.6 10*3/uL (ref 0.7–4.0)
MCH: 28.1 pg (ref 26.0–34.0)
MCHC: 34.3 g/dL (ref 30.0–36.0)
MCV: 81.9 fL (ref 78.0–100.0)
Monocytes Absolute: 0.5 10*3/uL (ref 0.1–1.0)
Monocytes Relative: 8 % (ref 3–12)
Neutro Abs: 5 10*3/uL (ref 1.7–7.7)
Neutrophils Relative %: 70 % (ref 43–77)
Platelets: 203 10*3/uL (ref 150–400)
RBC: 4.7 MIL/uL (ref 3.87–5.11)
RDW: 13.9 % (ref 11.5–15.5)
WBC: 7.1 10*3/uL (ref 4.0–10.5)

## 2014-05-06 LAB — URINALYSIS, ROUTINE W REFLEX MICROSCOPIC
Bilirubin Urine: NEGATIVE
Glucose, UA: NEGATIVE mg/dL
Hgb urine dipstick: NEGATIVE
Ketones, ur: NEGATIVE mg/dL
Leukocytes, UA: NEGATIVE
Nitrite: NEGATIVE
Protein, ur: NEGATIVE mg/dL
Specific Gravity, Urine: 1.022 (ref 1.005–1.030)
Urobilinogen, UA: 1 mg/dL (ref 0.0–1.0)
pH: 7.5 (ref 5.0–8.0)

## 2014-05-06 LAB — WET PREP, GENITAL
Trich, Wet Prep: NONE SEEN
Yeast Wet Prep HPF POC: NONE SEEN

## 2014-05-06 LAB — PREGNANCY, URINE: Preg Test, Ur: NEGATIVE

## 2014-05-06 MED ORDER — IBUPROFEN 800 MG PO TABS: 800.0000 mg | ORAL_TABLET | Freq: Three times a day (TID) | ORAL | Status: DC

## 2014-05-06 MED ORDER — METRONIDAZOLE 500 MG PO TABS: 500.0000 mg | ORAL_TABLET | Freq: Two times a day (BID) | ORAL | Status: DC

## 2014-05-06 MED ORDER — AZITHROMYCIN 1 G PO PACK
1.0000 g | PACK | Freq: Once | ORAL | Status: AC
  Administered 2014-05-06: 1 g via ORAL
  Filled 2014-05-06: qty 1

## 2014-05-06 MED ORDER — CEFTRIAXONE SODIUM 250 MG IJ SOLR
250.0000 mg | Freq: Once | INTRAMUSCULAR | Status: AC
Start: 2014-05-06 — End: 2014-05-06
  Administered 2014-05-06: 250 mg via INTRAMUSCULAR
  Filled 2014-05-06: qty 250

## 2014-05-06 MED ORDER — DOXYCYCLINE HYCLATE 100 MG PO CAPS: 100.0000 mg | ORAL_CAPSULE | Freq: Two times a day (BID) | ORAL | Status: DC

## 2014-05-06 MED ORDER — LIDOCAINE HCL (PF) 1 % IJ SOLN
0.9000 mL | Freq: Once | INTRAMUSCULAR | Status: AC
  Administered 2014-05-06: 0.9 mL
  Filled 2014-05-06: qty 5

## 2014-05-06 NOTE — ED Provider Notes (Signed)
CSN: [id number]     Arrival date & time 05/06/14  1214 History   First MD Initiated Contact with Patient 05/06/14 1526     Chief Complaint  Patient presents with  . Abdominal Pain     (Consider location/radiation/quality/duration/timing/severity/associated sxs/prior Treatment) HPI Comments: The patient is a 23 year old female past history of PID, UTI, presenting to the emergency department with chief complaint of abnormal vaginal discharge and lower abdominal discomfort for one week. Patient reports crampy lower abdominal discomfort, with radiation into right lower quadrant and left lower quadrant. Patient reports last menstrual period 3 days ago. Patient reports decrease in appetite denies nausea, vomiting. Patient reports normal bowel movements. She also reports vaginal irritation, and increase in vaginal discharge, malodorous.  The history is provided by the patient. No language interpreter was used.    Past Medical History  Diagnosis Date  . Anemia   . Depression   . Complication of anesthesia     ??, seizure with wisdom teeth  . Urinary tract infection   . Psoriasis   . PID (acute pelvic inflammatory disease)   . Chlamydia   . PONV (postoperative nausea and vomiting)   . [place]     [place]  . Headache(784.0)     otc med prn  . Anxiety   . Migraine    Past Surgical History  Procedure Laterality Date  . Cesarean section    . Skin graft      off abd, onto arm  . Wisdom tooth extraction    . Cesarean section  06/06/2012    Procedure: CESAREAN SECTION;  Surgeon: Adam PhenixJames G Arnold, MD;  Location: [facility];  Service: Obstetrics;  Laterality: N/A;   Family History  Problem Relation Age of Onset  . Hypertension Mother   . Diabetes Mother   . Cancer Mother     BREAST  . Stroke Mother   . Seizures Mother   . Asthma Daughter   . Hypertension Maternal Grandmother   . Diabetes Maternal Grandmother   . Cancer Maternal Grandmother     BONE CANCER  . Other  Neg Hx    History  Substance Use Topics  . Smoking status: Never Smoker   . Smokeless tobacco: Never Used  . Alcohol Use: Yes     Comment: socially but none with pregnancy   OB History   Grav Para Term Preterm Abortions TAB SAB Ect Mult Living   3 2 2  0 1 1 0 0 0 2     Review of Systems  Constitutional: Negative for fever and chills.  Gastrointestinal: Positive for abdominal pain. Negative for nausea, vomiting and diarrhea.  Genitourinary: Positive for vaginal discharge and pelvic pain. Negative for vaginal bleeding.      Allergies  Review of patient's allergies indicates no known allergies.  Home Medications   Prior to Admission medications   Medication Sig Start Date End Date Taking? Authorizing Provider  QUEtiapine (SEROQUEL) 100 MG tablet Take 100 mg by mouth at bedtime.   Yes Historical Provider, MD  sertraline (ZOLOFT) 50 MG tablet Take 50 mg by mouth daily.   Yes Historical Provider, MD   BP 110/79  Pulse 70  Temp(Src) 97.9 F (36.6 C) (Oral)  Resp 18  Ht 5\' 6"  (1.676 m)  Wt 210 lb (95.255 kg)  BMI 33.91 kg/m2  SpO2 100%  LMP 04/26/2014 Physical Exam  Nursing note and vitals reviewed. Constitutional: She is oriented to person, place, and time. She appears well-developed and  well-nourished. No distress.  HENT:  Head: Normocephalic and atraumatic.  Eyes: EOM are normal.  Neck: Neck supple.  Cardiovascular: Normal rate and regular rhythm.   Pulmonary/Chest: Effort normal. She has no wheezes. She has no rales.  Abdominal: Soft. Normal appearance. There is tenderness in the right lower quadrant, suprapubic area and left lower quadrant. There is no rebound.  Genitourinary: Cervix exhibits motion tenderness. Right adnexum displays tenderness. Right adnexum displays no mass. Left adnexum displays tenderness. Left adnexum displays no mass. There is tenderness around the vagina. No erythema around the vagina. Vaginal discharge found.  Moderate amount of clear-white  discharge on bilateral labia and mons pubis.Unable to advance adult specula, able to advance pediatric specula partially into the vaginal vault, moderate amount of white discharge unable to visualize cervix.  Chaperone present.   Neurological: She is alert and oriented to person, place, and time.  Skin: Skin is dry. She is not diaphoretic.  Psychiatric: She has a normal mood and affect. Her behavior is normal.    ED Course  Procedures (including critical care time) Labs Review Labs Reviewed  WET PREP, GENITAL - Abnormal; Notable for the following:    Clue Cells Wet Prep HPF POC FEW (*)    WBC, Wet Prep HPF POC RARE (*)    All other components within normal limits  COMPREHENSIVE METABOLIC PANEL - Abnormal; Notable for the following:    Total Protein 8.8 (*)    Anion gap 16 (*)    All other components within normal limits  URINALYSIS, ROUTINE W REFLEX MICROSCOPIC - Abnormal; Notable for the following:    APPearance HAZY (*)    All other components within normal limits  GC/CHLAMYDIA PROBE AMP  CBC WITH DIFFERENTIAL  PREGNANCY, URINE  POC URINE PREG, ED    Imaging Review No results found.   EKG Interpretation None      MDM   Final diagnoses:  Pelvic pain in female  BV (bacterial vaginosis)  Possible exposure to STD   Patient presents with lower abdominal discomfort no nausea, vomiting, reports similar symptoms with PID, patient is also abnormal vaginal discharge. Plan to treat for possible PID, or prep shows BV plan to treat and [facility], EMR shows patient missed appointment 04/18/2014. Patient is in no acute distress, afebrile, UA is negative for infection, negative urine pregnancy. No other abdominal complaints. CBC without concerning abnormalities, CMP shows total protein slightly elevated urged pt to follow up with PCP.  Meds given in ED:  Medications  azithromycin (ZITHROMAX) powder 1 g (1 g Oral Given 05/06/14 1748)  cefTRIAXone (ROCEPHIN)  injection 250 mg (250 mg Intramuscular Given 05/06/14 1748)  lidocaine (PF) (XYLOCAINE) 1 % injection 0.9 mL (0.9 mLs Other Given 05/06/14 1748)    Discharge Medication List as of 05/06/2014  5:59 PM    START taking these medications   Details  doxycycline (VIBRAMYCIN) 100 MG capsule Take 1 capsule (100 mg total) by mouth 2 (two) times daily., Starting 05/06/2014, Until Discontinued, Print    ibuprofen (ADVIL,MOTRIN) 800 MG tablet Take 1 tablet (800 mg total) by mouth 3 (three) times daily., Starting 05/06/2014, Until Discontinued, Print    metroNIDAZOLE (FLAGYL) 500 MG tablet Take 1 tablet (500 mg total) by mouth 2 (two) times daily., Starting 05/06/2014, Until Discontinued, Print        Mellody DrownLauren Ausha Sieh, PA-C 05/07/14 2112

## 2014-05-06 NOTE — ED Notes (Signed)
Pt with diffuse abdominal pain and vaginal pain and discharge.  Pt was tx for pid 1 month ago, finished tx, but s/s have returned.

## 2014-05-06 NOTE — Discharge Instructions (Signed)
Call for a follow up appointment with a Family or Primary Care Provider.  °Return if Symptoms worsen.   °Take medication as prescribed.  ° °You have been treated in the emergency department for an infection, possibly sexually transmitted. Results of your gonorrhea and chlamydia tests are pending and you will be notified if they are positive. It is very important to practice safe sex and use condoms when sexually active. If your results are positive you need to notify all sexual partners so they can be treated as well. The website http://www.dontspreadit.com/ can be used to send anonymous text messages or emails to alert sexual contacts. Follow up with your doctor, or OBGYN in regards to today's visit.   ° °Gonorrhea and Chlamydia °SYMPTOMS  °In females, symptoms may go unnoticed. Symptoms that are more noticeable can include:  °Belly (abdominal) pain.  °Painful intercourse.  °Watery mucous-like discharge from the vagina.  °Miscarriage.  °Discomfort when urinating.  °Inflammation of the rectum.  °Abnormal gray-green frothy vaginal discharge  °Vaginal itching and irritatio  °Itching and irritation of the area outside the vagina.   °Painful urination.  °Bleeding after sexual intercourse.  °In males, symptoms include:  °Burning with urination.  °Pain in the testicles.  °Watery mucous-like discharge from the penis.  °It can cause longstanding (chronic) pelvic pain after frequent infections.  °TREATMENT  °PID can cause women to not be able to have children (sterile) if left untreated or if half-treated.  It is important to finish ALL medications given to you.  °This is a sexually transmitted infection. So you are also at risk for other sexually transmitted diseases, including HIV (AIDS), it is recommended that you get tested. °HOME CARE INSTRUCTIONS  °Warning: This infection is contagious. Do not have sex until treatment is completed. Follow up at your caregiver's office or the clinic to which you were referred. If your  diagnosis (learning what is wrong) is confirmed by culture or some other method, your recent sexual contacts need treatment. Even if they are symptom free or have a negative culture or evaluation, they should be treated.  °PREVENTION  °Women should use sanitary pads instead of tampons for vaginal discharge.  °Wipe front to back after using the toilet and avoid douching.   °Practice safe sex, use condoms, have only one sex partner and be sure your sex partner is not having sex with others.  °Ask your caregiver to test you for chlamydia at your regular checkups or sooner if you are having symptoms.  °Ask for further information if you are pregnant.  °SEEK IMMEDIATE MEDICAL CARE IF:  °You develop an oral temperature above 102° F (38.9° C), not controlled by medications or lasting more than 2 days.  °You develop an increase in pain.  °You develop any type of abnormal discharge.  °You develop vaginal bleeding and it is not time for your period.  °You develop painful intercourse.  ° °Bacterial Vaginosis  °Bacterial vaginosis (BV) is a vaginal infection where the normal balance of bacteria in the vagina is disrupted. This is not a sexually transmitted disease and your sexual partners do NOT need to be treated. °CAUSES  °The cause of BV is not fully understood. BV develops when there is an increase or imbalance of harmful bacteria.  °Some activities or behaviors can upset the normal balance of bacteria in the vagina and put women at increased risk including:  °Having a new sex partner or multiple sex partners.  °Douching.  °Using an intrauterine device (IUD) for   contraception.  It is not clear what role sexual activity plays in the development of BV. However, women that have never had sexual intercourse are rarely infected with BV.  Women do not get BV from toilet seats, bedding, swimming pools or from touching objects around them.   SYMPTOMS  Grey vaginal discharge.  A fish-like odor with discharge, especially after  sexual intercourse.  Itching or burning of the vagina and vulva.  Burning or pain with urination.  Some women have no signs or symptoms at all.   TREATMENT  Sometimes BV will clear up without treatment.  BV may be treated with antibiotics.  BV can recur after treatment. If this happens, a second round of antibiotics will often be prescribed.  HOME CARE INSTRUCTIONS  Finish all medication as directed by your caregiver.  Do not have sex until treatment is completed.  Do NOT drink any alcoholic beverages while being treated  with Metronidazole (Flagyl). This will cause a severe reaction inducing vomiting.  RESOURCE GUIDE  Dental Problems  Patients with Medicaid: [facility]                     [facility] [phone]                                           [phone] Phone:  [phone]                                                  Phone:  [phone]  If unable to pay or uninsured, contact:  [facility] or [facility] to become qualified for the adult dental clinic.  Chronic Pain Problems Contact [facility]  [phone] Patients need to be referred by their primary care doctor.  Insufficient Money for Medicine Contact United Way:  call "211" or [facility] [phone].  No Primary Care Doctor Call [facility]  [phone] Other agencies that provide inexpensive medical care    [facility]  [phone]    [facility]  [phone]    [facility]  [phone]    [facility]  [phone]    [facility]  [phone]    [facility]  [phone]  Psychological Services [facility]  [phone] [facility]  [phone] [facility]   [phone] (emergency services [phone])  Substance Abuse Resources [facility]  [phone] [facility] [phone] [facility] [phone] [facility]  [phone] Residential & Outpatient Substance Abuse Program  [phone]  Abuse/Neglect [facility] Child Abuse Hotline (915) 169-3213(336) (580)[phone] [facility] Hotline [phone] (After Hours)  Emergency Shelter [facility] [phone]  Maternity Homes [facility] [place] [phone] [facility] [phone]  MRSA Hotline #:   930-439-9853(775)179-3330    [facility]  [facility]     [employer]                          [facility] [address] [place]  [address]      [address]  °[place]                                                [place]                            [place] °Phone:  [phone]                                   Phone:  [phone]                 Phone:  [phone] ° °[facility] °Phone:  342-8316 ° °[place] Child Abuse Hotline °(336) 342-1394 °([phone]) [phone] (After Hours) ° ° ° ° °

## 2014-05-07 LAB — GC/CHLAMYDIA PROBE AMP
CT Probe RNA: NEGATIVE
GC Probe RNA: NEGATIVE

## 2014-05-08 NOTE — ED Provider Notes (Signed)
Medical screening examination/treatment/procedure(s) were performed by non-physician practitioner and as supervising physician I was immediately available for consultation/collaboration.   Dione Boozeavid Meigan Pates, MD 05/08/14 [phone]

## 2014-05-12 ENCOUNTER — Emergency Department (HOSPITAL_COMMUNITY)
Admission: EM | Admit: 2014-05-12 | Disposition: A | Discharge status: Home / Self Care | Payer: Medicaid Other | Source: Home / Self Care

## 2014-05-12 NOTE — ED Notes (Signed)
The pt has been unable to locate  For 20 minutes.  Called numerus times

## 2014-05-13 ENCOUNTER — Encounter (HOSPITAL_COMMUNITY): Payer: Self-pay | Admitting: Emergency Medicine

## 2014-06-16 ENCOUNTER — Encounter (HOSPITAL_COMMUNITY): Payer: Self-pay | Admitting: *Deleted

## 2014-06-16 ENCOUNTER — Emergency Department (HOSPITAL_COMMUNITY)
Admission: EM | Admit: 2014-06-16 | Discharge: 2014-06-16 | Disposition: A | Discharge status: Home / Self Care | Payer: Medicaid Other | Attending: Emergency Medicine | Admitting: Emergency Medicine

## 2014-06-16 DIAGNOSIS — Z9889 Other specified postprocedural states: Secondary | ICD-10-CM | POA: Diagnosis not present

## 2014-06-16 DIAGNOSIS — M79622 Pain in left upper arm: Secondary | ICD-10-CM | POA: Diagnosis not present

## 2014-06-16 DIAGNOSIS — Z87440 Personal history of urinary (tract) infections: Secondary | ICD-10-CM | POA: Insufficient documentation

## 2014-06-16 DIAGNOSIS — Z792 Long term (current) use of antibiotics: Secondary | ICD-10-CM | POA: Diagnosis not present

## 2014-06-16 DIAGNOSIS — Z8619 Personal history of other infectious and parasitic diseases: Secondary | ICD-10-CM | POA: Diagnosis not present

## 2014-06-16 DIAGNOSIS — Z87828 Personal history of other (healed) physical injury and trauma: Secondary | ICD-10-CM | POA: Insufficient documentation

## 2014-06-16 DIAGNOSIS — M79602 Pain in left arm: Secondary | ICD-10-CM

## 2014-06-16 DIAGNOSIS — Z862 Personal history of diseases of the blood and blood-forming organs and certain disorders involving the immune mechanism: Secondary | ICD-10-CM | POA: Diagnosis not present

## 2014-06-16 DIAGNOSIS — Z8742 Personal history of other diseases of the female genital tract: Secondary | ICD-10-CM | POA: Insufficient documentation

## 2014-06-16 DIAGNOSIS — Z791 Long term (current) use of non-steroidal anti-inflammatories (NSAID): Secondary | ICD-10-CM | POA: Insufficient documentation

## 2014-06-16 DIAGNOSIS — Z8679 Personal history of other diseases of the circulatory system: Secondary | ICD-10-CM | POA: Diagnosis not present

## 2014-06-16 DIAGNOSIS — F419 Anxiety disorder, unspecified: Secondary | ICD-10-CM | POA: Insufficient documentation

## 2014-06-16 DIAGNOSIS — F329 Major depressive disorder, single episode, unspecified: Secondary | ICD-10-CM | POA: Diagnosis not present

## 2014-06-16 MED ORDER — DIAZEPAM 5 MG PO TABS
5.0000 mg | ORAL_TABLET | Freq: Once | ORAL | Status: AC
  Administered 2014-06-16: 5 mg via ORAL
  Filled 2014-06-16: qty 1

## 2014-06-16 MED ORDER — IBUPROFEN 800 MG PO TABS: 800.0000 mg | ORAL_TABLET | Freq: Three times a day (TID) | ORAL | Status: DC

## 2014-06-16 MED ORDER — DIAZEPAM 5 MG PO TABS: 5.0000 mg | ORAL_TABLET | Freq: Two times a day (BID) | ORAL | Status: DC

## 2014-06-16 NOTE — Discharge Instructions (Signed)
FOLLOW UP WITH [facility] FOR FURTHER OUTPATIENT EVALUATION OF ARM PAIN AT GRAFT SITE.

## 2014-06-16 NOTE — ED Notes (Signed)
Electronic signature not working. Pt. Stated she had no questions at discharge
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
## 2014-06-16 NOTE — ED Notes (Signed)
Pt. Refused wheelchair. Pt. Left with all belongings

## 2014-06-19 ENCOUNTER — Ambulatory Visit (INDEPENDENT_AMBULATORY_CARE_PROVIDER_SITE_OTHER): Payer: Medicaid Other | Admitting: Obstetrics & Gynecology

## 2014-06-19 ENCOUNTER — Encounter: Payer: Self-pay | Admitting: Obstetrics & Gynecology

## 2014-06-19 VITALS — BP 125/59 | HR 87 | Temp 98.0°F | Ht 66.0 in | Wt 211.9 lb

## 2014-06-19 DIAGNOSIS — Z331 Pregnant state, incidental: Secondary | ICD-10-CM

## 2014-06-19 DIAGNOSIS — R1030 Lower abdominal pain, unspecified: Secondary | ICD-10-CM

## 2014-06-19 DIAGNOSIS — N730 Acute parametritis and pelvic cellulitis: Secondary | ICD-10-CM

## 2014-06-19 LAB — POCT URINALYSIS DIP (DEVICE)
Bilirubin Urine: NEGATIVE
Glucose, UA: NEGATIVE mg/dL
Hgb urine dipstick: NEGATIVE
Ketones, ur: NEGATIVE mg/dL
Leukocytes, UA: NEGATIVE
Nitrite: NEGATIVE
Protein, ur: NEGATIVE mg/dL
Specific Gravity, Urine: 1.025 (ref 1.005–1.030)
Urobilinogen, UA: 0.2 mg/dL (ref 0.0–1.0)
pH: 6 (ref 5.0–8.0)

## 2014-06-19 LAB — POCT PREGNANCY, URINE: Preg Test, Ur: POSITIVE — AB

## 2014-06-19 MED ORDER — PRENATAL VITAMINS 0.8 MG PO TABS: 1.0000 | ORAL_TABLET | Freq: Every day | ORAL | Status: DC
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
## 2014-06-19 NOTE — Patient Instructions (Signed)
Return to clinic for any scheduled appointments or for any gynecologic concerns as needed.   

## 2014-06-23 ENCOUNTER — Emergency Department (HOSPITAL_COMMUNITY): Payer: Medicaid Other

## 2014-06-23 ENCOUNTER — Emergency Department (HOSPITAL_COMMUNITY)
Admission: EM | Admit: 2014-06-23 | Discharge: 2014-06-23 | Discharge status: Against Medical Advice | Payer: Medicaid Other | Attending: Emergency Medicine | Admitting: Emergency Medicine

## 2014-06-23 ENCOUNTER — Encounter (HOSPITAL_COMMUNITY): Payer: Self-pay | Admitting: Emergency Medicine

## 2014-06-23 DIAGNOSIS — O9A211 Injury, poisoning and certain other consequences of external causes complicating pregnancy, first trimester: Secondary | ICD-10-CM | POA: Insufficient documentation

## 2014-06-23 DIAGNOSIS — O9989 Other specified diseases and conditions complicating pregnancy, childbirth and the puerperium: Secondary | ICD-10-CM | POA: Diagnosis not present

## 2014-06-23 DIAGNOSIS — S199XXA Unspecified injury of neck, initial encounter: Secondary | ICD-10-CM | POA: Diagnosis not present

## 2014-06-23 DIAGNOSIS — Z79899 Other long term (current) drug therapy: Secondary | ICD-10-CM | POA: Insufficient documentation

## 2014-06-23 DIAGNOSIS — Y9389 Activity, other specified: Secondary | ICD-10-CM | POA: Insufficient documentation

## 2014-06-23 DIAGNOSIS — Z8679 Personal history of other diseases of the circulatory system: Secondary | ICD-10-CM | POA: Diagnosis not present

## 2014-06-23 DIAGNOSIS — F419 Anxiety disorder, unspecified: Secondary | ICD-10-CM | POA: Insufficient documentation

## 2014-06-23 DIAGNOSIS — O99341 Other mental disorders complicating pregnancy, first trimester: Secondary | ICD-10-CM | POA: Diagnosis not present

## 2014-06-23 DIAGNOSIS — Z87440 Personal history of urinary (tract) infections: Secondary | ICD-10-CM | POA: Insufficient documentation

## 2014-06-23 DIAGNOSIS — S0990XA Unspecified injury of head, initial encounter: Secondary | ICD-10-CM | POA: Insufficient documentation

## 2014-06-23 DIAGNOSIS — Z872 Personal history of diseases of the skin and subcutaneous tissue: Secondary | ICD-10-CM | POA: Diagnosis not present

## 2014-06-23 DIAGNOSIS — Z8742 Personal history of other diseases of the female genital tract: Secondary | ICD-10-CM | POA: Diagnosis not present

## 2014-06-23 DIAGNOSIS — Z3A01 Less than 8 weeks gestation of pregnancy: Secondary | ICD-10-CM | POA: Insufficient documentation

## 2014-06-23 DIAGNOSIS — F329 Major depressive disorder, single episode, unspecified: Secondary | ICD-10-CM | POA: Diagnosis not present

## 2014-06-23 DIAGNOSIS — O99351 Diseases of the nervous system complicating pregnancy, first trimester: Secondary | ICD-10-CM | POA: Insufficient documentation

## 2014-06-23 DIAGNOSIS — S301XXA Contusion of abdominal wall, initial encounter: Secondary | ICD-10-CM | POA: Insufficient documentation

## 2014-06-23 DIAGNOSIS — R55 Syncope and collapse: Secondary | ICD-10-CM | POA: Diagnosis not present

## 2014-06-23 DIAGNOSIS — Y998 Other external cause status: Secondary | ICD-10-CM | POA: Diagnosis not present

## 2014-06-23 DIAGNOSIS — Y9289 Other specified places as the place of occurrence of the external cause: Secondary | ICD-10-CM | POA: Insufficient documentation

## 2014-06-23 DIAGNOSIS — Z8619 Personal history of other infectious and parasitic diseases: Secondary | ICD-10-CM | POA: Diagnosis not present

## 2014-06-23 DIAGNOSIS — O209 Hemorrhage in early pregnancy, unspecified: Secondary | ICD-10-CM | POA: Diagnosis not present

## 2014-06-23 DIAGNOSIS — N939 Abnormal uterine and vaginal bleeding, unspecified: Secondary | ICD-10-CM

## 2014-06-23 DIAGNOSIS — G40909 Epilepsy, unspecified, not intractable, without status epilepticus: Secondary | ICD-10-CM | POA: Insufficient documentation

## 2014-06-23 DIAGNOSIS — O99011 Anemia complicating pregnancy, first trimester: Secondary | ICD-10-CM | POA: Diagnosis not present

## 2014-06-23 DIAGNOSIS — R4020 Unspecified coma: Secondary | ICD-10-CM

## 2014-06-23 LAB — CBC WITH DIFFERENTIAL/PLATELET
Basophils Absolute: 0 10*3/uL (ref 0.0–0.1)
Basophils Relative: 1 % (ref 0–1)
Eosinophils Absolute: 0.1 10*3/uL (ref 0.0–0.7)
Eosinophils Relative: 1 % (ref 0–5)
HCT: 38.3 % (ref 36.0–46.0)
Hemoglobin: 12.5 g/dL (ref 12.0–15.0)
Lymphocytes Relative: 19 % (ref 12–46)
Lymphs Abs: 1.2 10*3/uL (ref 0.7–4.0)
MCH: 27.2 pg (ref 26.0–34.0)
MCHC: 32.6 g/dL (ref 30.0–36.0)
MCV: 83.3 fL (ref 78.0–100.0)
Monocytes Absolute: 0.4 10*3/uL (ref 0.1–1.0)
Monocytes Relative: 7 % (ref 3–12)
Neutro Abs: 4.4 10*3/uL (ref 1.7–7.7)
Neutrophils Relative %: 72 % (ref 43–77)
Platelets: 180 10*3/uL (ref 150–400)
RBC: 4.6 MIL/uL (ref 3.87–5.11)
RDW: 14.3 % (ref 11.5–15.5)
WBC: 6 10*3/uL (ref 4.0–10.5)

## 2014-06-23 LAB — HCG, QUANTITATIVE, PREGNANCY: hCG, Beta Chain, Quant, S: 9 m[IU]/mL — ABNORMAL HIGH (ref <5)

## 2014-06-23 LAB — RAPID HIV SCREEN (WH-MAU): Rapid HIV Screen: NONREACTIVE

## 2014-06-23 LAB — COMPREHENSIVE METABOLIC PANEL
ALT: 14 U/L (ref 0–35)
AST: 15 U/L (ref 0–37)
Albumin: 3.9 g/dL (ref 3.5–5.2)
Alkaline Phosphatase: 72 U/L (ref 39–117)
Anion gap: 13 (ref 5–15)
BUN: 15 mg/dL (ref 6–23)
CO2: 24 mEq/L (ref 19–32)
Calcium: 9.3 mg/dL (ref 8.4–10.5)
Chloride: 102 mEq/L (ref 96–112)
Creatinine, Ser: 0.76 mg/dL (ref 0.50–1.10)
GFR calc Af Amer: >90 mL/min (ref >90)
GFR calc non Af Amer: >90 mL/min (ref >90)
Glucose, Bld: 78 mg/dL (ref 70–99)
Potassium: 4 mEq/L (ref 3.7–5.3)
Sodium: 139 mEq/L (ref 137–147)
Total Bilirubin: 0.3 mg/dL (ref 0.3–1.2)
Total Protein: 8.4 g/dL — ABNORMAL HIGH (ref 6.0–8.3)

## 2014-06-23 LAB — WET PREP, GENITAL
Trich, Wet Prep: NONE SEEN
Yeast Wet Prep HPF POC: NONE SEEN

## 2014-06-23 LAB — ABO/RH: ABO/RH(D): A POS

## 2014-06-23 LAB — RPR

## 2014-06-23 NOTE — ED Notes (Signed)
Patient walked out before nurse could go over discharge paperwork.

## 2014-06-23 NOTE — Discharge Instructions (Signed)
[facility]  You will need repeat ultrasound and quant in 2 weeks. Return to the ED for new concerns.
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
## 2014-06-24 ENCOUNTER — Telehealth: Payer: Self-pay

## 2014-06-24 ENCOUNTER — Inpatient Hospital Stay (HOSPITAL_COMMUNITY)
Admission: AD | Admit: 2014-06-24 | Discharge: 2014-06-24 | Disposition: A | Discharge status: Home / Self Care | Payer: Medicaid Other | Source: Ambulatory Visit | Attending: Family Medicine | Admitting: Family Medicine

## 2014-06-24 ENCOUNTER — Encounter (HOSPITAL_COMMUNITY): Payer: Self-pay | Admitting: *Deleted

## 2014-06-24 DIAGNOSIS — Z3A01 Less than 8 weeks gestation of pregnancy: Secondary | ICD-10-CM | POA: Insufficient documentation

## 2014-06-24 DIAGNOSIS — O209 Hemorrhage in early pregnancy, unspecified: Secondary | ICD-10-CM | POA: Diagnosis not present

## 2014-06-24 DIAGNOSIS — R1031 Right lower quadrant pain: Secondary | ICD-10-CM | POA: Insufficient documentation

## 2014-06-24 LAB — URINALYSIS, ROUTINE W REFLEX MICROSCOPIC
Bilirubin Urine: NEGATIVE
Glucose, UA: NEGATIVE mg/dL
Ketones, ur: NEGATIVE mg/dL
Leukocytes, UA: NEGATIVE
Nitrite: NEGATIVE
Protein, ur: NEGATIVE mg/dL
Specific Gravity, Urine: 1.025 (ref 1.005–1.030)
Urobilinogen, UA: 0.2 mg/dL (ref 0.0–1.0)
pH: 6 (ref 5.0–8.0)

## 2014-06-24 LAB — URINE MICROSCOPIC-ADD ON

## 2014-06-24 LAB — GC/CHLAMYDIA PROBE AMP
CT Probe RNA: NEGATIVE
GC Probe RNA: NEGATIVE

## 2014-06-24 LAB — POCT PREGNANCY, URINE: Preg Test, Ur: NEGATIVE

## 2014-06-24 MED ORDER — OXYCODONE-ACETAMINOPHEN 5-325 MG PO TABS: 1.0000 | ORAL_TABLET | ORAL | Status: DC | PRN

## 2014-06-24 MED ORDER — IBUPROFEN 600 MG PO TABS: 600.0000 mg | ORAL_TABLET | Freq: Four times a day (QID) | ORAL | Status: DC | PRN

## 2014-06-24 NOTE — Discharge Instructions (Signed)
Threatened Miscarriage °A threatened miscarriage occurs when you have vaginal bleeding during your first 20 weeks of pregnancy but the pregnancy has not ended. If you have vaginal bleeding during this time, your health care provider will do tests to make sure you are still pregnant. If the tests show you are still pregnant and the developing baby (fetus) inside your womb (uterus) is still growing, your condition is considered a threatened miscarriage. °A threatened miscarriage does not mean your pregnancy will end, but it does increase the risk of losing your pregnancy (complete miscarriage). °CAUSES  °The cause of a threatened miscarriage is usually not known. If you go on to have a complete miscarriage, the most common cause is an abnormal number of chromosomes in the developing baby. Chromosomes are the structures inside cells that hold all your genetic material. °Some causes of vaginal bleeding that do not result in miscarriage include: °· Having sex. °· Having an infection. °· Normal hormone changes of pregnancy. °· Bleeding that occurs when an egg implants in your uterus. °RISK FACTORS °Risk factors for bleeding in early pregnancy include: °· Obesity. °· Smoking. °· Drinking excessive amounts of alcohol or caffeine. °· Recreational drug use. °SIGNS AND SYMPTOMS °· Light vaginal bleeding. °· Mild abdominal pain or cramps. °DIAGNOSIS  °If you have bleeding with or without abdominal pain before 20 weeks of pregnancy, your health care provider will do tests to check whether you are still pregnant. One important test involves using sound waves and a computer (ultrasound) to create images of the inside of your uterus. Other tests include an internal exam of your vagina and uterus (pelvic exam) and measurement of your baby's heart rate.  °You may be diagnosed with a threatened miscarriage if: °· Ultrasound testing shows you are still pregnant. °· Your baby's heart rate is strong. °· A pelvic exam shows that the  opening between your uterus and your vagina (cervix) is closed. °· Your heart rate and blood pressure are stable. °· Blood tests confirm you are still pregnant. °TREATMENT  °No treatments have been shown to prevent a threatened miscarriage from going on to a complete miscarriage. However, the right home care is important.  °HOME CARE INSTRUCTIONS  °· Make sure you keep all your appointments for prenatal care. This is very important. °· Get plenty of rest. °· Do not have sex or use tampons if you have vaginal bleeding. °· Do not douche. °· Do not smoke or use recreational drugs. °· Do not drink alcohol. °· Avoid caffeine. °SEEK MEDICAL CARE IF: °· You have light vaginal bleeding or spotting while pregnant. °· You have abdominal pain or cramping. °· You have a fever. °SEEK IMMEDIATE MEDICAL CARE IF: °· You have heavy vaginal bleeding. °· You have blood clots coming from your vagina. °· You have severe low back pain or abdominal cramps. °· You have fever, chills, and severe abdominal pain. °MAKE SURE YOU: °· Understand these instructions. °· Will watch your condition. °· Will get help right away if you are not doing well or get worse. °Document Released: 06/28/2005 Document Revised: 07/03/2013 Document Reviewed: 04/24/2013 °ExitCare® Patient Information ©2015 [employer]. This information is not intended to replace advice given to you by your health care provider. Make sure you discuss any questions you have with your health care provider. ° °
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
## 2014-06-24 NOTE — MAU Note (Signed)
Patient presents [redacted] weeks pregnant with complaint of vaginal bleeding and back pain.

## 2014-06-24 NOTE — Telephone Encounter (Signed)
-----   Message from [name], CNM sent at 06/24/2014  3:01 PM EST ----- Hi [name], [facility], then a quant of 9 yesterday and neg UPT today with bleeding so probably has SAB. I scheduled F/U quant here in a wk since it is preg of unknown location and will let you know about cancelling the NOB 08/05/13. [facility] if she'd [facility], but won't get the same day result.  ----- Message -----    From: Louanna Rawaylor M [name], RN    Sent: 06/24/2014   2:28 PM      To: [name], CNM  HI Deirdre,   Patient came down to clinic today stating she was told in MAU she needs a F/u appointment. Per chart review, HCG ordered but not drawn. Would you like us to schedule lab appointment for this patient?  Please advise.   Thank you,  Kyung Ruddaylor Rube Sanchez, RN

## 2014-06-24 NOTE — MAU Note (Signed)
Pt presents to MAU with complaints of pain in her lower back and pain in her left side. Denies any vaginal bleeding

## 2014-06-24 NOTE — Telephone Encounter (Signed)
F/u HCG scheduled for one week from today for patient in clinic. Patient notified of appointment.

## 2014-07-01 ENCOUNTER — Other Ambulatory Visit: Payer: Medicaid Other

## 2014-07-01 DIAGNOSIS — O4690 Antepartum hemorrhage, unspecified, unspecified trimester: Secondary | ICD-10-CM

## 2014-07-02 ENCOUNTER — Telehealth: Payer: Self-pay | Admitting: *Deleted

## 2014-07-02 LAB — HCG, QUANTITATIVE, PREGNANCY: hCG, Beta Chain, Quant, S: <2 m[IU]/mL

## 2014-07-02 NOTE — Telephone Encounter (Signed)
Pt left message requesting results from yesterday. I returned her call and informed her that the test results shows negative for the pregnancy hormone. This means that she has had a failed pregnancy. I also inquired about her plans for birth control.  She stated that she would like the Mirena IUD. I will have our staff call her with an appt. In the meantime she [facility].  Pt agreed and voice understanding.

## 2014-07-09 ENCOUNTER — Encounter: Payer: Self-pay | Admitting: Medical

## 2014-07-19 ENCOUNTER — Ambulatory Visit: Payer: Medicaid Other | Admitting: Nurse Practitioner

## 2014-07-23 ENCOUNTER — Encounter: Payer: Self-pay | Admitting: General Practice

## 2014-07-24 ENCOUNTER — Encounter (HOSPITAL_COMMUNITY): Payer: Self-pay | Admitting: *Deleted

## 2014-07-24 ENCOUNTER — Emergency Department (HOSPITAL_COMMUNITY)
Admission: EM | Admit: 2014-07-24 | Discharge: 2014-07-24 | Disposition: A | Discharge status: Home / Self Care | Payer: Medicaid Other | Attending: Emergency Medicine | Admitting: Emergency Medicine

## 2014-07-24 DIAGNOSIS — G40909 Epilepsy, unspecified, not intractable, without status epilepticus: Secondary | ICD-10-CM | POA: Insufficient documentation

## 2014-07-24 DIAGNOSIS — Z3202 Encounter for pregnancy test, result negative: Secondary | ICD-10-CM | POA: Insufficient documentation

## 2014-07-24 DIAGNOSIS — Z8742 Personal history of other diseases of the female genital tract: Secondary | ICD-10-CM | POA: Diagnosis not present

## 2014-07-24 DIAGNOSIS — Z8679 Personal history of other diseases of the circulatory system: Secondary | ICD-10-CM | POA: Insufficient documentation

## 2014-07-24 DIAGNOSIS — F419 Anxiety disorder, unspecified: Secondary | ICD-10-CM | POA: Insufficient documentation

## 2014-07-24 DIAGNOSIS — Z8619 Personal history of other infectious and parasitic diseases: Secondary | ICD-10-CM | POA: Insufficient documentation

## 2014-07-24 DIAGNOSIS — N390 Urinary tract infection, site not specified: Secondary | ICD-10-CM | POA: Diagnosis not present

## 2014-07-24 DIAGNOSIS — K5900 Constipation, unspecified: Secondary | ICD-10-CM | POA: Diagnosis not present

## 2014-07-24 DIAGNOSIS — Z872 Personal history of diseases of the skin and subcutaneous tissue: Secondary | ICD-10-CM | POA: Insufficient documentation

## 2014-07-24 DIAGNOSIS — Z862 Personal history of diseases of the blood and blood-forming organs and certain disorders involving the immune mechanism: Secondary | ICD-10-CM | POA: Diagnosis not present

## 2014-07-24 DIAGNOSIS — R1084 Generalized abdominal pain: Secondary | ICD-10-CM | POA: Diagnosis present

## 2014-07-24 LAB — URINE MICROSCOPIC-ADD ON

## 2014-07-24 LAB — COMPREHENSIVE METABOLIC PANEL
ALT: 15 U/L (ref 0–35)
AST: 19 U/L (ref 0–37)
Albumin: 3.9 g/dL (ref 3.5–5.2)
Alkaline Phosphatase: 59 U/L (ref 39–117)
Anion gap: 10 (ref 5–15)
BUN: 15 mg/dL (ref 6–23)
CO2: 23 mmol/L (ref 19–32)
Calcium: 9 mg/dL (ref 8.4–10.5)
Chloride: 106 mEq/L (ref 96–112)
Creatinine, Ser: 0.74 mg/dL (ref 0.50–1.10)
GFR calc Af Amer: >90 mL/min (ref >90)
GFR calc non Af Amer: >90 mL/min (ref >90)
Glucose, Bld: 80 mg/dL (ref 70–99)
Potassium: 4 mmol/L (ref 3.5–5.1)
Sodium: 139 mmol/L (ref 135–145)
Total Bilirubin: 0.7 mg/dL (ref 0.3–1.2)
Total Protein: 7.6 g/dL (ref 6.0–8.3)

## 2014-07-24 LAB — CBC WITH DIFFERENTIAL/PLATELET
Basophils Absolute: 0 10*3/uL (ref 0.0–0.1)
Basophils Relative: 0 % (ref 0–1)
Eosinophils Absolute: 0 10*3/uL (ref 0.0–0.7)
Eosinophils Relative: 0 % (ref 0–5)
HCT: 37 % (ref 36.0–46.0)
Hemoglobin: 12.2 g/dL (ref 12.0–15.0)
Lymphocytes Relative: 32 % (ref 12–46)
Lymphs Abs: 1.4 10*3/uL (ref 0.7–4.0)
MCH: 27.1 pg (ref 26.0–34.0)
MCHC: 33 g/dL (ref 30.0–36.0)
MCV: 82 fL (ref 78.0–100.0)
Monocytes Absolute: 0.4 10*3/uL (ref 0.1–1.0)
Monocytes Relative: 8 % (ref 3–12)
Neutro Abs: 2.6 10*3/uL (ref 1.7–7.7)
Neutrophils Relative %: 60 % (ref 43–77)
Platelets: 126 10*3/uL — ABNORMAL LOW (ref 150–400)
RBC: 4.51 MIL/uL (ref 3.87–5.11)
RDW: 13.8 % (ref 11.5–15.5)
WBC: 4.5 10*3/uL (ref 4.0–10.5)

## 2014-07-24 LAB — URINALYSIS, ROUTINE W REFLEX MICROSCOPIC
Bilirubin Urine: NEGATIVE
Glucose, UA: NEGATIVE mg/dL
Ketones, ur: NEGATIVE mg/dL
Nitrite: NEGATIVE
Protein, ur: NEGATIVE mg/dL
Specific Gravity, Urine: 1.03 (ref 1.005–1.030)
Urobilinogen, UA: 1 mg/dL (ref 0.0–1.0)
pH: 6 (ref 5.0–8.0)

## 2014-07-24 LAB — WET PREP, GENITAL
Clue Cells Wet Prep HPF POC: NONE SEEN
Trich, Wet Prep: NONE SEEN
Yeast Wet Prep HPF POC: NONE SEEN

## 2014-07-24 LAB — PREGNANCY, URINE: Preg Test, Ur: NEGATIVE

## 2014-07-24 LAB — LIPASE, BLOOD: Lipase: 21 U/L (ref 11–59)

## 2014-07-24 MED ORDER — ONDANSETRON 4 MG PO TBDP: 4.0000 mg | ORAL_TABLET | Freq: Three times a day (TID) | ORAL | Status: DC | PRN

## 2014-07-24 MED ORDER — SODIUM CHLORIDE 0.9 % IV BOLUS (SEPSIS)
1000.0000 mL | Freq: Once | INTRAVENOUS | Status: AC
  Administered 2014-07-24: 1000 mL via INTRAVENOUS

## 2014-07-24 MED ORDER — CEPHALEXIN 500 MG PO CAPS: 500.0000 mg | ORAL_CAPSULE | Freq: Four times a day (QID) | ORAL | Status: DC

## 2014-07-24 NOTE — Discharge Instructions (Signed)
Take keflex as directed until gone. Take zofran as needed for nausea. Refer to attached documents for more information. Return to the ED with worsening or concerning symptoms.

## 2014-07-24 NOTE — ED Notes (Signed)
Pt requesting to speak to social work. Social work notified.

## 2014-07-24 NOTE — ED Provider Notes (Signed)
CSN: [id number]     Arrival date & time 07/24/14  1252 History   First MD Initiated Contact with Patient 07/24/14 1309     Chief Complaint  Patient presents with  . Abdominal Pain     (Consider location/radiation/quality/duration/timing/severity/associated sxs/prior Treatment) HPI Comments: Patient is a 24 year old female who presents with generalized abdominal pain for the past week. The pain is located in her generalized abdomen and flanks and does not radiate. The pain is described as aching and severe. The pain started gradually and progressively worsened since the onset. No alleviating/aggravating factors. The patient has tried nothing for symptoms without relief. Associated symptoms include nausea and vaginal discharge. Patient denies fever, headache, vomiting, diarrhea, chest pain, SOB, dysuria, constipation. Patient reports being pregnant last month but had a miscarriage.     Past Medical History  Diagnosis Date  . Anemia   . Depression   . Complication of anesthesia     ??, seizure with wisdom teeth  . Urinary tract infection   . Psoriasis   . PID (acute pelvic inflammatory disease)   . Chlamydia   . PONV (postoperative nausea and vomiting)   . Seizures     July 2013 [place]  . Headache(784.0)     otc med prn  . Anxiety   . Migraine    Past Surgical History  Procedure Laterality Date  . Cesarean section    . Skin graft      off abd, onto arm  . Wisdom tooth extraction    . Cesarean section  06/06/2012    Procedure: CESAREAN SECTION;  Surgeon: Adam PhenixJames G Arnold, MD;  Location: [facility];  Service: Obstetrics;  Laterality: N/A;   Family History  Problem Relation Age of Onset  . Hypertension Mother   . Diabetes Mother   . Cancer Mother     BREAST  . Stroke Mother   . Seizures Mother   . Asthma Daughter   . Hypertension Maternal Grandmother   . Diabetes Maternal Grandmother   . Cancer Maternal Grandmother     BONE CANCER  . Other Neg Hx    History   Substance Use Topics  . Smoking status: Never Smoker   . Smokeless tobacco: Never Used  . Alcohol Use: Yes     Comment: socially but none with pregnancy   OB History    Gravida Para Term Preterm AB TAB SAB Ectopic Multiple Living   4 2 2  0 1 1 0 0 0 2     Review of Systems  Constitutional: Negative for fever, chills and fatigue.  HENT: Negative for trouble swallowing.   Eyes: Negative for visual disturbance.  Respiratory: Negative for shortness of breath.   Cardiovascular: Negative for chest pain and palpitations.  Gastrointestinal: Positive for abdominal pain and constipation. Negative for nausea, vomiting and diarrhea.  Genitourinary: Negative for dysuria and difficulty urinating.  Musculoskeletal: Negative for arthralgias and neck pain.  Skin: Negative for color change.  Neurological: Negative for dizziness and weakness.  Psychiatric/Behavioral: Negative for dysphoric mood.      Allergies  Review of patient's allergies indicates no known allergies.  Home Medications   Prior to Admission medications   Medication Sig Start Date End Date Taking? Authorizing Provider  ibuprofen (ADVIL,MOTRIN) 600 MG tablet Take 1 tablet (600 mg total) by mouth every 6 (six) hours as needed. 06/24/14  Yes Deirdre C Poe, CNM  diazepam (VALIUM) 5 MG tablet Take 1 tablet (5 mg total) by mouth 2 (two)  times daily. Patient not taking: Reported on 07/24/2014 06/16/14   Melvenia Beam A Upstill, PA-C  oxyCODONE-acetaminophen (PERCOCET/ROXICET) 5-325 MG per tablet Take 1 tablet by mouth every 4 (four) hours as needed. Patient not taking: Reported on 07/24/2014 06/24/14   Deirdre Colin Mulders, CNM  oxyCODONE-acetaminophen (PERCOCET/ROXICET) 5-325 MG per tablet Take 1-2 tablets by mouth every 4 (four) hours as needed for moderate pain or severe pain. Patient not taking: Reported on 07/24/2014 06/24/14   Deirdre C Poe, CNM   BP 118/61 mmHg  Pulse 100  Temp(Src) 97.8 F (36.6 C) (Oral)  Resp 14  SpO2 100%  LMP  05/15/2014 (Exact Date) Physical Exam  Constitutional: She is oriented to person, place, and time. She appears well-developed and well-nourished. No distress.  HENT:  Head: Normocephalic and atraumatic.  Eyes: Conjunctivae are normal.  Neck: Normal range of motion.  Cardiovascular: Normal rate and regular rhythm.  Exam reveals no gallop and no friction rub.   No murmur heard. Pulmonary/Chest: Effort normal and breath sounds normal. She has no wheezes. She has no rales. She exhibits no tenderness.  Abdominal: Soft. She exhibits no distension. There is no tenderness. There is no rebound.  Genitourinary: Vagina normal.  Normal external genitalia. Copious, thick, yellow vaginal discharge. No CMT.   Musculoskeletal: Normal range of motion.  Neurological: She is alert and oriented to person, place, and time. Coordination normal.  Speech is goal-oriented. Moves limbs without ataxia.   Skin: Skin is warm and dry.  Psychiatric: She has a normal mood and affect. Her behavior is normal.  Nursing note and vitals reviewed.   ED Course  Procedures (including critical care time) Labs Review Labs Reviewed  WET PREP, GENITAL - Abnormal; Notable for the following:    WBC, Wet Prep HPF POC MODERATE (*)    All other components within normal limits  CBC WITH DIFFERENTIAL - Abnormal; Notable for the following:    Platelets 126 (*)    All other components within normal limits  URINALYSIS, ROUTINE W REFLEX MICROSCOPIC - Abnormal; Notable for the following:    APPearance CLOUDY (*)    Hgb urine dipstick MODERATE (*)    Leukocytes, UA MODERATE (*)    All other components within normal limits  URINE MICROSCOPIC-ADD ON - Abnormal; Notable for the following:    Squamous Epithelial / LPF MANY (*)    Bacteria, UA MANY (*)    All other components within normal limits  COMPREHENSIVE METABOLIC PANEL  LIPASE, BLOOD  PREGNANCY, URINE  GC/CHLAMYDIA PROBE AMP (Daniel)    Imaging Review No results  found.   EKG Interpretation None      MDM   Final diagnoses:  UTI (lower urinary tract infection)    1:34 PM Labs and urinalysis pending. Vitals stable and patient afebrile.   3:47 PM Patient's labs unremarkable for acute changes. Urinalysis shows UTI. Vitals stable and patient afebrile. Patient will be treated with keflex. Patient will have zofran for nausea.   Emilia Beck, PA-C 07/24/14 1550  Vanetta Mulders, MD 07/24/14 1552

## 2014-07-24 NOTE — Discharge Planning (Signed)
CM consulted related to pt not having $3.00 for Medicaid co-pay.  NCM talked to pt and recommended pt have prescription filled at [facility] where they can waive Medicaid fees.  NCM provided bus passes for pt and children.

## 2014-07-24 NOTE — ED Notes (Signed)
Pt requested to speak with someone who can help her get medications. States $4 copay is a barrier.

## 2014-07-24 NOTE — ED Notes (Signed)
Pt reports generalized abd pain, having constipation, denies n/v.

## 2014-07-25 LAB — GC/CHLAMYDIA PROBE AMP (~~LOC~~) NOT AT ARMC
Chlamydia: NEGATIVE
Neisseria Gonorrhea: NEGATIVE

## 2014-07-26 ENCOUNTER — Emergency Department (HOSPITAL_COMMUNITY)
Admission: EM | Admit: 2014-07-26 | Discharge: 2014-07-26 | Disposition: A | Discharge status: Home / Self Care | Payer: Medicaid Other | Attending: Emergency Medicine | Admitting: Emergency Medicine

## 2014-07-26 ENCOUNTER — Encounter (HOSPITAL_COMMUNITY): Payer: Self-pay | Admitting: Emergency Medicine

## 2014-07-26 DIAGNOSIS — S335XXA Sprain of ligaments of lumbar spine, initial encounter: Secondary | ICD-10-CM | POA: Insufficient documentation

## 2014-07-26 DIAGNOSIS — W108XXA Fall (on) (from) other stairs and steps, initial encounter: Secondary | ICD-10-CM | POA: Diagnosis not present

## 2014-07-26 DIAGNOSIS — Y998 Other external cause status: Secondary | ICD-10-CM | POA: Insufficient documentation

## 2014-07-26 DIAGNOSIS — Z8742 Personal history of other diseases of the female genital tract: Secondary | ICD-10-CM | POA: Diagnosis not present

## 2014-07-26 DIAGNOSIS — S3992XA Unspecified injury of lower back, initial encounter: Secondary | ICD-10-CM | POA: Diagnosis present

## 2014-07-26 DIAGNOSIS — Z872 Personal history of diseases of the skin and subcutaneous tissue: Secondary | ICD-10-CM | POA: Diagnosis not present

## 2014-07-26 DIAGNOSIS — Y9289 Other specified places as the place of occurrence of the external cause: Secondary | ICD-10-CM | POA: Insufficient documentation

## 2014-07-26 DIAGNOSIS — F419 Anxiety disorder, unspecified: Secondary | ICD-10-CM | POA: Insufficient documentation

## 2014-07-26 DIAGNOSIS — Z862 Personal history of diseases of the blood and blood-forming organs and certain disorders involving the immune mechanism: Secondary | ICD-10-CM | POA: Diagnosis not present

## 2014-07-26 DIAGNOSIS — Z87440 Personal history of urinary (tract) infections: Secondary | ICD-10-CM | POA: Insufficient documentation

## 2014-07-26 DIAGNOSIS — Z8679 Personal history of other diseases of the circulatory system: Secondary | ICD-10-CM | POA: Diagnosis not present

## 2014-07-26 DIAGNOSIS — Y9389 Activity, other specified: Secondary | ICD-10-CM | POA: Diagnosis not present

## 2014-07-26 DIAGNOSIS — Z8619 Personal history of other infectious and parasitic diseases: Secondary | ICD-10-CM | POA: Diagnosis not present

## 2014-07-26 DIAGNOSIS — S239XXA Sprain of unspecified parts of thorax, initial encounter: Secondary | ICD-10-CM

## 2014-07-26 DIAGNOSIS — W19XXXA Unspecified fall, initial encounter: Secondary | ICD-10-CM

## 2014-07-26 MED ORDER — NAPROXEN 500 MG PO TABS: 500.0000 mg | ORAL_TABLET | Freq: Two times a day (BID) | ORAL | Status: DC

## 2014-07-26 MED ORDER — OXYCODONE-ACETAMINOPHEN 5-325 MG PO TABS
1.0000 | ORAL_TABLET | Freq: Once | ORAL | Status: AC
  Administered 2014-07-26: 1 via ORAL
  Filled 2014-07-26: qty 1

## 2014-07-26 MED ORDER — METHOCARBAMOL 500 MG PO TABS: 500.0000 mg | ORAL_TABLET | Freq: Two times a day (BID) | ORAL | Status: DC | PRN

## 2014-07-26 NOTE — ED Notes (Signed)
Pt reports total back pain, shoulders to low back x 3 days. Pt stated that she slipped and fell one hour ago. Denies dizziness or LOC. Reports chronic headaches. Pain unresponsive to motrin. Currently under treatment for UTI. Unable to tolerate medication due to nausea. Did not fill rx for antinausea

## 2014-07-26 NOTE — Discharge Instructions (Signed)
Back Pain, Adult °Low back pain is very common. About 1 in 5 people have back pain. The cause of low back pain is rarely dangerous. The pain often gets better over time. About half of people with a sudden onset of back pain feel better in just 2 weeks. About 8 in 10 people feel better by 6 weeks.  °CAUSES °Some common causes of back pain include: °· Strain of the muscles or ligaments supporting the spine. °· Wear and tear (degeneration) of the spinal discs. °· Arthritis. °· Direct injury to the back. °DIAGNOSIS °Most of the time, the direct cause of low back pain is not known. However, back pain can be treated effectively even when the exact cause of the pain is unknown. Answering your caregiver's questions about your overall health and symptoms is one of the most accurate ways to make sure the cause of your pain is not dangerous. If your caregiver needs more information, he or she may order lab work or imaging tests (X-rays or MRIs). However, even if imaging tests show changes in your back, this usually does not require surgery. °HOME CARE INSTRUCTIONS °For many people, back pain returns. Since low back pain is rarely dangerous, it is often a condition that people can learn to manage on their own.  °· Remain active. It is stressful on the back to sit or stand in one place. Do not sit, drive, or stand in one place for more than 30 minutes at a time. Take short walks on level surfaces as soon as pain allows. Try to increase the length of time you walk each day. °· Do not stay in bed. Resting more than 1 or 2 days can delay your recovery. °· Do not avoid exercise or work. Your body is made to move. It is not dangerous to be active, even though your back may hurt. Your back will likely heal faster if you return to being active before your pain is gone. °· Pay attention to your body when you  bend and lift. Many people have less discomfort when lifting if they bend their knees, keep the load close to their bodies, and  avoid twisting. Often, the most comfortable positions are those that put less stress on your recovering back. °· Find a comfortable position to sleep. Use a firm mattress and lie on your side with your knees slightly bent. If you lie on your back, put a pillow under your knees. °· Only take over-the-counter or prescription medicines as directed by your caregiver. Over-the-counter medicines to reduce pain and inflammation are often the most helpful. Your caregiver may prescribe muscle relaxant drugs. These medicines help dull your pain so you can more quickly return to your normal activities and healthy exercise. °· Put ice on the injured area. °¨ Put ice in a plastic bag. °¨ Place a towel between your skin and the bag. °¨ Leave the ice on for 15-20 minutes, 03-04 times a day for the first 2 to 3 days. After that, ice and heat may be alternated to reduce pain and spasms. °· Ask your caregiver about trying back exercises and gentle massage. This may be of some benefit. °· Avoid feeling anxious or stressed. Stress increases muscle tension and can worsen back pain. It is important to recognize when you are anxious or stressed and learn ways to manage it. Exercise is a great option. °SEEK MEDICAL CARE IF: °· You have pain that is not relieved with rest or medicine. °· You have pain that does not improve in 1 week. °· You have new symptoms. °· You are generally not feeling well. °SEEK   IMMEDIATE MEDICAL CARE IF:  °· You have pain that radiates from your back into your legs. °· You develop new bowel or bladder control problems. °· You have unusual weakness or numbness in your arms or legs. °· You develop nausea or vomiting. °· You develop abdominal pain. °· You feel faint. °Document Released: 06/28/2005 Document Revised: 12/28/2011 Document Reviewed: 10/30/2013 °ExitCare® Patient Information ©2015 [employer]. This information is not intended to replace advice given to you by your health care provider. Make sure you  discuss any questions you have with your health care provider. ° °Back Exercises °Back exercises help treat and prevent back injuries. The goal of back exercises is to increase the strength of your abdominal and back muscles and the flexibility of your back. These exercises should be started when you no longer have back pain. Back exercises include: °· Pelvic Tilt. Lie on your back with your knees bent. Tilt your pelvis until the lower part of your back is against the floor. Hold this position 5 to 10 sec and repeat 5 to 10 times. °· Knee to Chest. Pull first 1 knee up against your chest and hold for 20 to 30 seconds, repeat this with the other knee, and then both knees. This may be done with the other leg straight or bent, whichever feels better. °· Sit-Ups or Curl-Ups. Bend your knees 90 degrees. Start with tilting your pelvis, and do a partial, slow sit-up, lifting your trunk only 30 to 45 degrees off the floor. Take at least 2 to 3 seconds for each sit-up. Do not do sit-ups with your knees out straight. If partial sit-ups are difficult, simply do the above but with only tightening your abdominal muscles and holding it as directed. °· Hip-Lift. Lie on your back with your knees flexed 90 degrees. Push down with your feet and shoulders as you raise your hips a couple inches off the floor; hold for 10 seconds, repeat 5 to 10 times. °· Back arches. Lie on your stomach, propping yourself up on bent elbows. Slowly press on your hands, causing an arch in your low back. Repeat 3 to 5 times. Any initial stiffness and discomfort should lessen with repetition over time. °· Shoulder-Lifts. Lie face down with arms beside your body. Keep hips and torso pressed to floor as you slowly lift your head and shoulders off the floor. °Do not overdo your exercises, especially in the beginning. Exercises may cause you some mild back discomfort which lasts for a few minutes; however, if the pain is more severe, or lasts for more than 15  minutes, do not continue exercises until you see your caregiver. Improvement with exercise therapy for back problems is slow.  °See your caregivers for assistance with developing a proper back exercise program. °Document Released: 08/05/2004 Document Revised: 09/20/2011 Document Reviewed: 04/29/2011 °ExitCare® Patient Information ©2015 [employer]. This information is not intended to replace advice given to you by your health care provider. Make sure you discuss any questions you have with your health care provider. ° °

## 2014-07-26 NOTE — ED Provider Notes (Signed)
CSN: [id number]     Arrival date & time 07/26/14  1307 History   None    Chief Complaint  Patient presents with  . Back Pain  . Fall    fell down 4 steps one hour ago. Denies dizziness or LOC  . Headache   The history is provided by the patient. No language interpreter was used.   This chart was scribed for non-physician practitioner Everlene FarrierWilliam [employer]i [employer], PA-C,  working with No att. providers found, by Andrew Auaven Small, ED Scribe. This patient was seen in room WTR6/WTR6 and the patient's care was started at 10:17 PM.  Chloe Rodgers is a 24 y.o. female who presents to the Emergency Department complaining of a back pain onset 1 hour ago. Pt states she slipped down 4 steps outside and twisted her back. Pt has had back pain for a while but now has worsening pain that she rates an 8/10 pain due to fall. Pt reports worsening pain with movement. She reports associated HA. Pt has taken ibuprofen.  Pt denies head impaction and LOC. Pt denies back pain red flags including IV drug use and hx of cancer. Pt denies  numbness, tingling, weakness, dizziness, light headedness, bladder and bowel incontinence, fever, chills, abnormal gait.  Past Medical History  Diagnosis Date  . Anemia   . Depression   . Complication of anesthesia     ??, seizure with wisdom teeth  . Urinary tract infection   . Psoriasis   . PID (acute pelvic inflammatory disease)   . Chlamydia   . PONV (postoperative nausea and vomiting)   . [place]ures     [place]  . Headache(784.0)     otc med prn  . Anxiety   . Migraine    Past Surgical History  Procedure Laterality Date  . Cesarean section    . Skin graft      off abd, onto arm  . Wisdom tooth extraction    . Cesarean section  06/06/2012    Procedure: CESAREAN SECTION;  Surgeon: Adam PhenixJames G Arnold, MD;  Location: [facility];  Service: Obstetrics;  Laterality: N/A;   Family History  Problem Relation Age of Onset  . Hypertension Mother   . Diabetes Mother   . Cancer Mother      BREAST  . Stroke Mother   . Seizures Mother   . Asthma Daughter   . Hypertension Maternal Grandmother   . Diabetes Maternal Grandmother   . Cancer Maternal Grandmother     BONE CANCER  . Other Neg Hx    History  Substance Use Topics  . Smoking status: Never Smoker   . Smokeless tobacco: Never Used  . Alcohol Use: Yes     Comment: socially but none with pregnancy   OB History    Gravida Para Term Preterm AB TAB SAB Ectopic Multiple Living   4 2 2  0 1 1 0 0 0 2     Review of Systems  Constitutional: Negative for fever and chills.  Gastrointestinal: Negative for diarrhea and constipation.  Genitourinary: Positive for flank pain. Negative for enuresis.  Musculoskeletal: Positive for myalgias and back pain. Negative for gait problem, neck pain and neck stiffness.  Skin: Negative for wound.  Neurological: Positive for headaches. Negative for dizziness, syncope, weakness, light-headedness and numbness.    Allergies  Review of patient's allergies indicates no known allergies.  Home Medications   Prior to Admission medications   Medication Sig Start Date End Date Taking? Authorizing Provider  cephALEXin (KEFLEX) 500 MG capsule Take 1 capsule (500 mg total) by mouth 4 (four) times daily. 07/24/14   Kaitlyn Szekalski, PA-C  diazepam (VALIUM) 5 MG tablet Take 1 tablet (5 mg total) by mouth 2 (two) times daily. Patient not taking: Reported on 07/24/2014 06/16/14   Melvenia Beam A Upstill, PA-C  ibuprofen (ADVIL,MOTRIN) 600 MG tablet Take 1 tablet (600 mg total) by mouth every 6 (six) hours as needed. 06/24/14   Deirdre Colin Mulders, CNM  methocarbamol (ROBAXIN) 500 MG tablet Take 1 tablet (500 mg total) by mouth 2 (two) times daily as needed for muscle spasms. 07/26/14   Einar Gip Jerame Hedding, PA-C  naproxen (NAPROSYN) 500 MG tablet Take 1 tablet (500 mg total) by mouth 2 (two) times daily with a meal. 07/26/14   Einar Gip Mathew Storck, PA-C  ondansetron (ZOFRAN ODT) 4 MG disintegrating tablet Take 1  tablet (4 mg total) by mouth every 8 (eight) hours as needed for nausea or vomiting. 07/24/14   Emilia Beck, PA-C  oxyCODONE-acetaminophen (PERCOCET/ROXICET) 5-325 MG per tablet Take 1 tablet by mouth every 4 (four) hours as needed. Patient not taking: Reported on 07/24/2014 06/24/14   Deirdre Colin Mulders, CNM  oxyCODONE-acetaminophen (PERCOCET/ROXICET) 5-325 MG per tablet Take 1-2 tablets by mouth every 4 (four) hours as needed for moderate pain or severe pain. Patient not taking: Reported on 07/24/2014 06/24/14   Deirdre C Poe, CNM   BP 133/78 mmHg  Pulse 85  Temp(Src) 98.6 F (37 C) (Oral)  Resp 16  SpO2 100%  LMP 07/23/2014  Breastfeeding? Unknown Physical Exam  Constitutional: She is oriented to person, place, and time. She appears well-developed and well-nourished. No distress.  HENT:  Head: Normocephalic and atraumatic.  Right Ear: External ear normal.  Left Ear: External ear normal.  Nose: Nose normal.  Mouth/Throat: Uvula is midline and oropharynx is clear and moist. No oropharyngeal exudate, posterior oropharyngeal edema, posterior oropharyngeal erythema or tonsillar abscesses.  Eyes: Conjunctivae and EOM are normal. Pupils are equal, round, and reactive to light. Right eye exhibits no discharge. Left eye exhibits no discharge.  Neck: Normal range of motion. Neck supple.  Cardiovascular: Normal rate, regular rhythm, normal heart sounds and intact distal pulses.  Exam reveals no gallop and no friction rub.   No murmur heard. Pulmonary/Chest: Effort normal and breath sounds normal. No respiratory distress. She has no wheezes. She has no rales. She exhibits no tenderness.  Abdominal: Soft. There is no tenderness.  Musculoskeletal: Normal range of motion.  Generalized back tenderness. No midline tenderness. No crepitous. No bony stepoff. No hematoma.  Patient's strength is 5 out of 5 in her bilateral upper and lower extremities. Patient is able to ambulate without difficulty or  assistance.  Lymphadenopathy:    She has no cervical adenopathy.  Neurological: She is alert and oriented to person, place, and time. She has normal reflexes. Coordination normal.  Skin: Skin is warm and dry. She is not diaphoretic.  Psychiatric: She has a normal mood and affect. Her behavior is normal.  Nursing note and vitals reviewed.   ED Course  Procedures (including critical care time) DIAGNOSTIC STUDIES: Oxygen Saturation is 100% on RA, normal by my interpretation.    COORDINATION OF CARE: 10:17 PM- Pt advised of plan for treatment pain medication and pt agrees. Pt has been advised to return to the ED if symptoms worsen.  Labs Review Labs Reviewed - No data to display  Imaging Review No results found.   EKG Interpretation None  Filed Vitals:   07/26/14 1327 07/26/14 1410  BP: 133/78   Pulse: 93 85  Temp: 98.6 F (37 C)   TempSrc: Oral   Resp: 16   SpO2: 100% 100%     MDM   Meds given in ED:  Medications  oxyCODONE-acetaminophen (PERCOCET/ROXICET) 5-325 MG per tablet 1 tablet (1 tablet Oral Given 07/26/14 1412)    Discharge Medication List as of 07/26/2014  1:55 PM    START taking these medications   Details  methocarbamol (ROBAXIN) 500 MG tablet Take 1 tablet (500 mg total) by mouth 2 (two) times daily as needed for muscle spasms., Starting 07/26/2014, Until Discontinued, Print    naproxen (NAPROSYN) 500 MG tablet Take 1 tablet (500 mg total) by mouth 2 (two) times daily with a meal., Starting 07/26/2014, Until Discontinued, Print        Final diagnoses:  Back sprain, initial encounter  Fall, initial encounter   This is a 24 year old female who presents to the emergency department after slipping and falling down 4 steps and twisting her back. Patient denies trauma to her back. The patient is neurologically intact. No bony point tenderness to the back. No crepitus, step-offs or erythema noted. No deformity noted. Patient is afebrile and  nontoxic-appearing. Patient given Percocet in the ED. Patient discharged with Naprosyn and Robaxin for back pain. I advised the patient to follow-up with their primary care provider this week. I advised the patient to return to the emergency department with new or worsening symptoms or new concerns. The patient verbalized understanding and agreement with plan.    I personally performed the services described in this documentation, which was scribed in my presence. The recorded information has been reviewed and is accurate.    Lawana Chambers, PA-C 07/26/14 2218  Richardean Canal, MD 07/29/14 [phone]

## 2014-07-30 ENCOUNTER — Encounter (HOSPITAL_COMMUNITY): Payer: Self-pay

## 2014-07-30 ENCOUNTER — Emergency Department (HOSPITAL_COMMUNITY)
Admission: EM | Admit: 2014-07-30 | Discharge: 2014-07-30 | Disposition: A | Discharge status: Home / Self Care | Payer: Medicaid Other | Attending: Emergency Medicine | Admitting: Emergency Medicine

## 2014-07-30 DIAGNOSIS — Z8742 Personal history of other diseases of the female genital tract: Secondary | ICD-10-CM | POA: Insufficient documentation

## 2014-07-30 DIAGNOSIS — Z792 Long term (current) use of antibiotics: Secondary | ICD-10-CM | POA: Diagnosis not present

## 2014-07-30 DIAGNOSIS — Z3202 Encounter for pregnancy test, result negative: Secondary | ICD-10-CM | POA: Diagnosis not present

## 2014-07-30 DIAGNOSIS — Z791 Long term (current) use of non-steroidal anti-inflammatories (NSAID): Secondary | ICD-10-CM | POA: Insufficient documentation

## 2014-07-30 DIAGNOSIS — Z8679 Personal history of other diseases of the circulatory system: Secondary | ICD-10-CM | POA: Insufficient documentation

## 2014-07-30 DIAGNOSIS — G40909 Epilepsy, unspecified, not intractable, without status epilepticus: Secondary | ICD-10-CM | POA: Insufficient documentation

## 2014-07-30 DIAGNOSIS — Z862 Personal history of diseases of the blood and blood-forming organs and certain disorders involving the immune mechanism: Secondary | ICD-10-CM | POA: Insufficient documentation

## 2014-07-30 DIAGNOSIS — Z872 Personal history of diseases of the skin and subcutaneous tissue: Secondary | ICD-10-CM | POA: Diagnosis not present

## 2014-07-30 DIAGNOSIS — R109 Unspecified abdominal pain: Secondary | ICD-10-CM | POA: Diagnosis present

## 2014-07-30 DIAGNOSIS — F419 Anxiety disorder, unspecified: Secondary | ICD-10-CM | POA: Diagnosis not present

## 2014-07-30 DIAGNOSIS — N390 Urinary tract infection, site not specified: Secondary | ICD-10-CM | POA: Diagnosis not present

## 2014-07-30 LAB — BASIC METABOLIC PANEL
Anion gap: 9 (ref 5–15)
BUN: 12 mg/dL (ref 6–23)
CO2: 25 mmol/L (ref 19–32)
Calcium: 9.4 mg/dL (ref 8.4–10.5)
Chloride: 106 mEq/L (ref 96–112)
Creatinine, Ser: 1.26 mg/dL — ABNORMAL HIGH (ref 0.50–1.10)
GFR calc Af Amer: 69 mL/min — ABNORMAL LOW (ref >90)
GFR calc non Af Amer: 59 mL/min — ABNORMAL LOW (ref >90)
Glucose, Bld: 78 mg/dL (ref 70–99)
Potassium: 3.7 mmol/L (ref 3.5–5.1)
Sodium: 140 mmol/L (ref 135–145)

## 2014-07-30 LAB — URINE MICROSCOPIC-ADD ON

## 2014-07-30 LAB — CBC WITH DIFFERENTIAL/PLATELET
Basophils Absolute: 0 10*3/uL (ref 0.0–0.1)
Basophils Relative: 0 % (ref 0–1)
Eosinophils Absolute: 0 10*3/uL (ref 0.0–0.7)
Eosinophils Relative: 1 % (ref 0–5)
HCT: 36.7 % (ref 36.0–46.0)
Hemoglobin: 12.2 g/dL (ref 12.0–15.0)
Lymphocytes Relative: 20 % (ref 12–46)
Lymphs Abs: 1.3 10*3/uL (ref 0.7–4.0)
MCH: 27.2 pg (ref 26.0–34.0)
MCHC: 33.2 g/dL (ref 30.0–36.0)
MCV: 81.7 fL (ref 78.0–100.0)
Monocytes Absolute: 0.8 10*3/uL (ref 0.1–1.0)
Monocytes Relative: 12 % (ref 3–12)
Neutro Abs: 4.4 10*3/uL (ref 1.7–7.7)
Neutrophils Relative %: 67 % (ref 43–77)
Platelets: 157 10*3/uL (ref 150–400)
RBC: 4.49 MIL/uL (ref 3.87–5.11)
RDW: 13.9 % (ref 11.5–15.5)
WBC: 6.5 10*3/uL (ref 4.0–10.5)

## 2014-07-30 LAB — URINALYSIS, ROUTINE W REFLEX MICROSCOPIC
Bilirubin Urine: NEGATIVE
Glucose, UA: NEGATIVE mg/dL
Hgb urine dipstick: NEGATIVE
Ketones, ur: NEGATIVE mg/dL
Nitrite: NEGATIVE
Protein, ur: NEGATIVE mg/dL
Specific Gravity, Urine: 1.027 (ref 1.005–1.030)
Urobilinogen, UA: 1 mg/dL (ref 0.0–1.0)
pH: 6.5 (ref 5.0–8.0)

## 2014-07-30 LAB — PREGNANCY, URINE: Preg Test, Ur: NEGATIVE

## 2014-07-30 MED ORDER — SODIUM CHLORIDE 0.9 % IV BOLUS (SEPSIS)
1000.0000 mL | Freq: Once | INTRAVENOUS | Status: AC
  Administered 2014-07-30: 1000 mL via INTRAVENOUS

## 2014-07-30 NOTE — ED Provider Notes (Signed)
CSN: [id number]     Arrival date & time 07/30/14  1946 History   First MD Initiated Contact with Patient 07/30/14 2035     Chief Complaint  Patient presents with  . Abdominal Pain     (Consider location/radiation/quality/duration/timing/severity/associated sxs/prior Treatment) HPI   Chloe Rodgers is a 24 y.o. female who presents for evaluation of right-sided pain, and chills.  She is currently taking medications, for urinary check infection.  She denies nausea, vomiting, chest pain, headache or dizziness.  She has low back pain.  She came here, ambulatory, by bus.  She states that she is not pregnant at this time.  There are no other known modifying factors.    Past Medical History  Diagnosis Date  . Anemia   . Depression   . Complication of anesthesia     ??, seizure with wisdom teeth  . Urinary tract infection   . Psoriasis   . PID (acute pelvic inflammatory disease)   . Chlamydia   . PONV (postoperative nausea and vomiting)   . Seizures     July [place]  . Headache(784.0)     otc med prn  . Anxiety   . Migraine    Past Surgical History  Procedure Laterality Date  . Cesarean section    . Skin graft      off abd, onto arm  . Wisdom tooth extraction    . Cesarean section  06/06/2012    Procedure: CESAREAN SECTION;  Surgeon: Adam PhenixJames G Arnold, MD;  Location: [facility];  Service: Obstetrics;  Laterality: N/A;   Family History  Problem Relation Age of Onset  . Hypertension Mother   . Diabetes Mother   . Cancer Mother     BREAST  . Stroke Mother   . Seizures Mother   . Asthma Daughter   . Hypertension Maternal Grandmother   . Diabetes Maternal Grandmother   . Cancer Maternal Grandmother     BONE CANCER  . Other Neg Hx    History  Substance Use Topics  . Smoking status: Never Smoker   . Smokeless tobacco: Never Used  . Alcohol Use: Yes     Comment: socially but none with pregnancy   OB History    Gravida Para Term Preterm AB TAB SAB Ectopic Multiple  Living   4 2 2  0 1 1 0 0 0 2     Review of Systems  All other systems reviewed and are negative.     Allergies  Review of patient's allergies indicates no known allergies.  Home Medications   Prior to Admission medications   Medication Sig Start Date End Date Taking? Authorizing Provider  cephALEXin (KEFLEX) 500 MG capsule Take 1 capsule (500 mg total) by mouth 4 (four) times daily. 07/24/14  Yes Kaitlyn Szekalski, PA-C  ibuprofen (ADVIL,MOTRIN) 600 MG tablet Take 1 tablet (600 mg total) by mouth every 6 (six) hours as needed. 06/24/14  Yes Deirdre C Poe, CNM  methocarbamol (ROBAXIN) 500 MG tablet Take 1 tablet (500 mg total) by mouth 2 (two) times daily as needed for muscle spasms. 07/26/14  Yes Einar GipWilliam Duncan Dansie, PA-C  naproxen (NAPROSYN) 500 MG tablet Take 1 tablet (500 mg total) by mouth 2 (two) times daily with a meal. 07/26/14  Yes Einar GipWilliam Duncan Dansie, PA-C  ondansetron (ZOFRAN ODT) 4 MG disintegrating tablet Take 1 tablet (4 mg total) by mouth every 8 (eight) hours as needed for nausea or vomiting. 07/24/14  Yes Kaitlyn Szekalski, PA-C  diazepam (  VALIUM) 5 MG tablet Take 1 tablet (5 mg total) by mouth 2 (two) times daily. Patient not taking: Reported on 07/24/2014 06/16/14   Melvenia Beam A Upstill, PA-C  oxyCODONE-acetaminophen (PERCOCET/ROXICET) 5-325 MG per tablet Take 1 tablet by mouth every 4 (four) hours as needed. Patient not taking: Reported on 07/24/2014 06/24/14   Deirdre Colin Mulders, CNM  oxyCODONE-acetaminophen (PERCOCET/ROXICET) 5-325 MG per tablet Take 1-2 tablets by mouth every 4 (four) hours as needed for moderate pain or severe pain. Patient not taking: Reported on 07/24/2014 06/24/14   Deirdre C Poe, CNM   BP 112/69 mmHg  Pulse 93  Temp(Src) 97.3 F (36.3 C) (Oral)  Resp 18  Ht  (1.676 m)  Wt 216 lb (97.977 kg)  BMI 34.88 kg/m2  SpO2 90%  LMP 07/23/2014 Physical Exam  Constitutional: She is oriented to person, place, and time. She appears well-developed and  well-nourished.  HENT:  Head: Normocephalic and atraumatic.  Right Ear: External ear normal.  Left Ear: External ear normal.  Mucous membranes are moist.  Eyes: Conjunctivae and EOM are normal. Pupils are equal, round, and reactive to light.  Neck: Normal range of motion and phonation normal. Neck supple.  Cardiovascular: Normal rate, regular rhythm and normal heart sounds.   Pulmonary/Chest: Effort normal and breath sounds normal. She exhibits no bony tenderness.  Abdominal: Soft. She exhibits no distension. There is no tenderness.  Genitourinary:  No costal vertebral angle tenderness to percussion.  Musculoskeletal: Normal range of motion.  Neurological: She is alert and oriented to person, place, and time. No cranial nerve deficit or sensory deficit. She exhibits normal muscle tone. Coordination normal.  Skin: Skin is warm, dry and intact.  Psychiatric: She has a normal mood and affect. Her behavior is normal. Judgment and thought content normal.  Nursing note and vitals reviewed.   ED Course  Procedures (including critical care time) Medications  sodium chloride 0.9 % bolus 1,000 mL (0 mLs Intravenous Stopped 07/30/14 2137)    Patient Vitals for the past 24 hrs:  BP Temp Temp src Pulse Resp SpO2 Height Weight  07/30/14 2100 112/69 mmHg - - 93 - 90 % - -  07/30/14 1951 130/87 mmHg 97.3 F (36.3 C) Oral 97 18 100 %  (1.676 m) 216 lb (97.977 kg)       Labs Review Labs Reviewed  URINALYSIS, ROUTINE W REFLEX MICROSCOPIC - Abnormal; Notable for the following:    Leukocytes, UA SMALL (*)    All other components within normal limits  BASIC METABOLIC PANEL - Abnormal; Notable for the following:    Creatinine, Ser 1.26 (*)    GFR calc non Af Amer 59 (*)    GFR calc Af Amer 69 (*)    All other components within normal limits  URINE MICROSCOPIC-ADD ON - Abnormal; Notable for the following:    Squamous Epithelial / LPF MANY (*)    All other components within normal limits   PREGNANCY, URINE  CBC WITH DIFFERENTIAL    Imaging Review No results found.   EKG Interpretation None      MDM   Final diagnoses:  UTI (lower urinary tract infection)    Uncomplicated UTI with nonspecific pain.  Doubt pyelonephritis, serious bacterial infection or metabolic instability.   Nursing Notes Reviewed/ Care Coordinated Applicable Imaging Reviewed Interpretation of Laboratory Data incorporated into ED treatment  The patient appears reasonably screened and/or stabilized for discharge and I doubt any other medical condition or other [facility] requiring further screening,  evaluation, or treatment in the ED at this time prior to discharge.  Plan: Home Medications- usual; Home Treatments- rest, fluids; return here if the recommended treatment, does not improve the symptoms; Recommended follow up- PCP prn  Flint Melter, MD 07/30/14 2349

## 2014-07-30 NOTE — Discharge Instructions (Signed)
Continue taking your antibiotic. Use Tylenol as needed for pain.    Urinary Tract Infection Urinary tract infections (UTIs) can develop anywhere along your urinary tract. Your urinary tract is your body's drainage system for removing wastes and extra water. Your urinary tract includes two kidneys, two ureters, a bladder, and a urethra. Your kidneys are a pair of bean-shaped organs. Each kidney is about the size of your fist. They are located below your ribs, one on each side of your spine. CAUSES Infections are caused by microbes, which are microscopic organisms, including fungi, viruses, and bacteria. These organisms are so small that they can only be seen through a microscope. Bacteria are the microbes that most commonly cause UTIs. SYMPTOMS  Symptoms of UTIs may vary by age and gender of the patient and by the location of the infection. Symptoms in young women typically include a frequent and intense urge to urinate and a painful, burning feeling in the bladder or urethra during urination. Older women and men are more likely to be tired, shaky, and weak and have muscle aches and abdominal pain. A fever may mean the infection is in your kidneys. Other symptoms of a kidney infection include pain in your back or sides below the ribs, nausea, and vomiting. DIAGNOSIS To diagnose a UTI, your caregiver will ask you about your symptoms. Your caregiver also will ask to provide a urine sample. The urine sample will be tested for bacteria and white blood cells. White blood cells are made by your body to help fight infection. TREATMENT  Typically, UTIs can be treated with medication. Because most UTIs are caused by a bacterial infection, they usually can be treated with the use of antibiotics. The choice of antibiotic and length of treatment depend on your symptoms and the type of bacteria causing your infection. HOME CARE INSTRUCTIONS  If you were prescribed antibiotics, take them exactly as your caregiver  instructs you. Finish the medication even if you feel better after you have only taken some of the medication.  Drink enough water and fluids to keep your urine clear or pale yellow.  Avoid caffeine, tea, and carbonated beverages. They tend to irritate your bladder.  Empty your bladder often. Avoid holding urine for long periods of time.  Empty your bladder before and after sexual intercourse.  After a bowel movement, women should cleanse from front to back. Use each tissue only once. SEEK MEDICAL CARE IF:   You have back pain.  You develop a fever.  Your symptoms do not begin to resolve within 3 days. SEEK IMMEDIATE MEDICAL CARE IF:   You have severe back pain or lower abdominal pain.  You develop chills.  You have nausea or vomiting.  You have continued burning or discomfort with urination. MAKE SURE YOU:   Understand these instructions.  Will watch your condition.  Will get help right away if you are not doing well or get worse. Document Released: 04/07/2005 Document Revised: 12/28/2011 Document Reviewed: 08/06/2011 [facility] Patient Information 2015 [employer] [place]. This information is not intended to replace advice given to you by your health care provider. Make sure you discuss any questions you have with your health care provider.

## 2014-07-30 NOTE — ED Notes (Signed)
Pt reports being treated for a U[facility] the other day [facility].  Still reporting pain with urination and now abdominal pain. Denies nvd.  Pain 8/10.

## 2014-08-05 ENCOUNTER — Encounter: Payer: Medicaid Other | Admitting: Obstetrics & Gynecology

## 2014-08-19 ENCOUNTER — Encounter (HOSPITAL_COMMUNITY): Payer: Self-pay | Admitting: Emergency Medicine

## 2014-08-19 ENCOUNTER — Emergency Department (HOSPITAL_COMMUNITY)
Admission: EM | Admit: 2014-08-19 | Discharge: 2014-08-19 | Disposition: A | Discharge status: Home / Self Care | Payer: Medicaid Other | Attending: Emergency Medicine | Admitting: Emergency Medicine

## 2014-08-19 DIAGNOSIS — Z3A12 12 weeks gestation of pregnancy: Secondary | ICD-10-CM | POA: Insufficient documentation

## 2014-08-19 DIAGNOSIS — Z8659 Personal history of other mental and behavioral disorders: Secondary | ICD-10-CM | POA: Insufficient documentation

## 2014-08-19 DIAGNOSIS — Z8742 Personal history of other diseases of the female genital tract: Secondary | ICD-10-CM | POA: Diagnosis not present

## 2014-08-19 DIAGNOSIS — Z792 Long term (current) use of antibiotics: Secondary | ICD-10-CM | POA: Insufficient documentation

## 2014-08-19 DIAGNOSIS — R569 Unspecified convulsions: Secondary | ICD-10-CM | POA: Insufficient documentation

## 2014-08-19 DIAGNOSIS — Z872 Personal history of diseases of the skin and subcutaneous tissue: Secondary | ICD-10-CM | POA: Diagnosis not present

## 2014-08-19 DIAGNOSIS — Z8679 Personal history of other diseases of the circulatory system: Secondary | ICD-10-CM | POA: Insufficient documentation

## 2014-08-19 DIAGNOSIS — Z791 Long term (current) use of non-steroidal anti-inflammatories (NSAID): Secondary | ICD-10-CM | POA: Insufficient documentation

## 2014-08-19 DIAGNOSIS — Z8619 Personal history of other infectious and parasitic diseases: Secondary | ICD-10-CM | POA: Diagnosis not present

## 2014-08-19 DIAGNOSIS — Z87440 Personal history of urinary (tract) infections: Secondary | ICD-10-CM | POA: Diagnosis not present

## 2014-08-19 DIAGNOSIS — Z79899 Other long term (current) drug therapy: Secondary | ICD-10-CM | POA: Diagnosis not present

## 2014-08-19 DIAGNOSIS — O9989 Other specified diseases and conditions complicating pregnancy, childbirth and the puerperium: Secondary | ICD-10-CM | POA: Diagnosis present

## 2014-08-19 DIAGNOSIS — Z862 Personal history of diseases of the blood and blood-forming organs and certain disorders involving the immune mechanism: Secondary | ICD-10-CM | POA: Insufficient documentation

## 2014-08-19 DIAGNOSIS — Z3490 Encounter for supervision of normal pregnancy, unspecified, unspecified trimester: Secondary | ICD-10-CM

## 2014-08-19 LAB — URINALYSIS, ROUTINE W REFLEX MICROSCOPIC
Bilirubin Urine: NEGATIVE
Glucose, UA: NEGATIVE mg/dL
Hgb urine dipstick: NEGATIVE
Ketones, ur: NEGATIVE mg/dL
Nitrite: NEGATIVE
Protein, ur: NEGATIVE mg/dL
Specific Gravity, Urine: 1.019 (ref 1.005–1.030)
Urobilinogen, UA: 1 mg/dL (ref 0.0–1.0)
pH: 7.5 (ref 5.0–8.0)

## 2014-08-19 LAB — CBC WITH DIFFERENTIAL/PLATELET
Basophils Absolute: 0 10*3/uL (ref 0.0–0.1)
Basophils Relative: 0 % (ref 0–1)
Eosinophils Absolute: 0 10*3/uL (ref 0.0–0.7)
Eosinophils Relative: 1 % (ref 0–5)
HCT: 36 % (ref 36.0–46.0)
Hemoglobin: 11.8 g/dL — ABNORMAL LOW (ref 12.0–15.0)
Lymphocytes Relative: 23 % (ref 12–46)
Lymphs Abs: 1.2 10*3/uL (ref 0.7–4.0)
MCH: 27.3 pg (ref 26.0–34.0)
MCHC: 32.8 g/dL (ref 30.0–36.0)
MCV: 83.3 fL (ref 78.0–100.0)
Monocytes Absolute: 0.5 10*3/uL (ref 0.1–1.0)
Monocytes Relative: 9 % (ref 3–12)
Neutro Abs: 3.6 10*3/uL (ref 1.7–7.7)
Neutrophils Relative %: 67 % (ref 43–77)
Platelets: 163 10*3/uL (ref 150–400)
RBC: 4.32 MIL/uL (ref 3.87–5.11)
RDW: 14.1 % (ref 11.5–15.5)
WBC: 5.4 10*3/uL (ref 4.0–10.5)

## 2014-08-19 LAB — URINE MICROSCOPIC-ADD ON

## 2014-08-19 LAB — BASIC METABOLIC PANEL
Anion gap: 6 (ref 5–15)
BUN: 8 mg/dL (ref 6–23)
CO2: 23 mmol/L (ref 19–32)
Calcium: 8.7 mg/dL (ref 8.4–10.5)
Chloride: 105 mmol/L (ref 96–112)
Creatinine, Ser: 0.68 mg/dL (ref 0.50–1.10)
GFR calc Af Amer: >90 mL/min (ref >90)
GFR calc non Af Amer: >90 mL/min (ref >90)
Glucose, Bld: 89 mg/dL (ref 70–99)
Potassium: 3.2 mmol/L — ABNORMAL LOW (ref 3.5–5.1)
Sodium: 134 mmol/L — ABNORMAL LOW (ref 135–145)

## 2014-08-19 LAB — HCG, QUANTITATIVE, PREGNANCY: hCG, Beta Chain, Quant, S: 848 m[IU]/mL — ABNORMAL HIGH (ref <5)

## 2014-08-19 LAB — PREGNANCY, URINE: Preg Test, Ur: POSITIVE — AB

## 2014-08-19 MED ORDER — PRENATAL COMPLETE 14-0.4 MG PO TABS: ORAL_TABLET | ORAL | Status: DC

## 2014-08-19 MED ORDER — SODIUM CHLORIDE 0.9 % IV BOLUS (SEPSIS)
1000.0000 mL | Freq: Once | INTRAVENOUS | Status: AC
  Administered 2014-08-19: 1000 mL via INTRAVENOUS

## 2014-08-19 NOTE — ED Notes (Addendum)
Per EMS pt with Hx of seizure and head injury, here for witnessed seizure-like upper body shaking activity lasting 3-5 seconds. Per [facility], who witnessed activity, pt was in bed at time and did not fall. Per EMS pt is alert and oriented x 3, some lethargy present, EMS has history with this patient and states this is patient baseline. Pt states she has not had her period in 3 months, wants a pregnancy test. Pt denies any seizure medication in past month, currently A & O x 4 on exam.

## 2014-08-19 NOTE — Discharge Instructions (Signed)
First Trimester of Pregnancy The first trimester of pregnancy is from week 1 until the end of week 12 (months 1 through 3). A week after a sperm fertilizes an egg, the egg will implant on the wall of the uterus. This embryo will begin to develop into a baby. Genes from you and your partner are forming the baby. The female genes determine whether the baby is a boy or a girl. At 6-8 weeks, the eyes and face are formed, and the heartbeat can be seen on ultrasound. At the end of 12 weeks, all the baby's organs are formed.  Now that you are pregnant, you will want to do everything you can to have a healthy baby. Two of the most important things are to get good prenatal care and to follow your health care provider's instructions. Prenatal care is all the medical care you receive before the baby's birth. This care will help prevent, find, and treat any problems during the pregnancy and childbirth. BODY CHANGES Your body goes through many changes during pregnancy. The changes vary from woman to woman.   You may gain or lose a couple of pounds at first.  You may feel sick to your stomach (nauseous) and throw up (vomit). If the vomiting is uncontrollable, call your health care provider.  You may tire easily.  You may develop headaches that can be relieved by medicines approved by your health care provider.  You may urinate more often. Painful urination may mean you have a bladder infection.  You may develop heartburn as a result of your pregnancy.  You may develop constipation because certain hormones are causing the muscles that push waste through your intestines to slow down.  You may develop hemorrhoids or swollen, bulging veins (varicose veins).  Your breasts may begin to grow larger and become tender. Your nipples may stick out more, and the tissue that surrounds them (areola) may become darker.  Your gums may bleed and may be sensitive to brushing and flossing.  Dark spots or blotches (chloasma,  mask of pregnancy) may develop on your face. This will likely fade after the baby is born.  Your menstrual periods will stop.  You may have a loss of appetite.  You may develop cravings for certain kinds of food.  You may have changes in your emotions from day to day, such as being excited to be pregnant or being concerned that something may go wrong with the pregnancy and baby.  You may have more vivid and strange dreams.  You may have changes in your hair. These can include thickening of your hair, rapid growth, and changes in texture. Some women also have hair loss during or after pregnancy, or hair that feels dry or thin. Your hair will most likely return to normal after your baby is born. WHAT TO EXPECT AT YOUR PRENATAL VISITS During a routine prenatal visit:  You will be weighed to make sure you and the baby are growing normally.  Your blood pressure will be taken.  Your abdomen will be measured to track your baby's growth.  The fetal heartbeat will be listened to starting around week 10 or 12 of your pregnancy.  Test results from any previous visits will be discussed. Your health care provider may ask you:  How you are feeling.  If you are feeling the baby move.  If you have had any abnormal symptoms, such as leaking fluid, bleeding, severe headaches, or abdominal cramping.  If you have any questions. Other tests   that may be performed during your first trimester include:  Blood tests to find your blood type and to check for the presence of any previous infections. They will also be used to check for low iron levels (anemia) and Rh antibodies. Later in the pregnancy, blood tests for diabetes will be done along with other tests if problems develop.  Urine tests to check for infections, diabetes, or protein in the urine.  An ultrasound to confirm the proper growth and development of the baby.  An amniocentesis to check for possible genetic problems.  Fetal screens for  spina bifida and Down syndrome.  You may need other tests to make sure you and the baby are doing well. HOME CARE INSTRUCTIONS  Medicines  Follow your health care provider's instructions regarding medicine use. Specific medicines may be either safe or unsafe to take during pregnancy.  Take your prenatal vitamins as directed.  If you develop constipation, try taking a stool softener if your health care provider approves. Diet  Eat regular, well-balanced meals. Choose a variety of foods, such as meat or vegetable-based protein, fish, milk and low-fat dairy products, vegetables, fruits, and whole grain breads and cereals. Your health care provider will help you determine the amount of weight gain that is right for you.  Avoid raw meat and uncooked cheese. These carry germs that can cause birth defects in the baby.  Eating four or five small meals rather than three large meals a day may help relieve nausea and vomiting. If you start to feel nauseous, eating a few soda crackers can be helpful. Drinking liquids between meals instead of during meals also seems to help nausea and vomiting.  If you develop constipation, eat more high-fiber foods, such as fresh vegetables or fruit and whole grains. Drink enough fluids to keep your urine clear or pale yellow. Activity and Exercise  Exercise only as directed by your health care provider. Exercising will help you:  Control your weight.  Stay in shape.  Be prepared for labor and delivery.  Experiencing pain or cramping in the lower abdomen or low back is a good sign that you should stop exercising. Check with your health care provider before continuing normal exercises.  Try to avoid standing for long periods of time. Move your legs often if you must stand in one place for a long time.  Avoid heavy lifting.  Wear low-heeled shoes, and practice good posture.  You may continue to have sex unless your health care provider directs you  otherwise. Relief of Pain or Discomfort  Wear a good support bra for breast tenderness.   Take warm sitz baths to soothe any pain or discomfort caused by hemorrhoids. Use hemorrhoid cream if your health care provider approves.   Rest with your legs elevated if you have leg cramps or low back pain.  If you develop varicose veins in your legs, wear support hose. Elevate your feet for 15 minutes, 3-4 times a day. Limit salt in your diet. Prenatal Care  Schedule your prenatal visits by the twelfth week of pregnancy. They are usually scheduled monthly at first, then more often in the last 2 months before delivery.  Write down your questions. Take them to your prenatal visits.  Keep all your prenatal visits as directed by your health care provider. Safety  Wear your seat belt at all times when driving.  Make a list of emergency phone numbers, including numbers for family, friends, the hospital, and police and fire departments. General Tips    Ask your health care provider for a referral to a local prenatal education class. Begin classes no later than at the beginning of month 6 of your pregnancy.  Ask for help if you have counseling or nutritional needs during pregnancy. Your health care provider can offer advice or refer you to specialists for help with various needs.  Do not use hot tubs, steam rooms, or saunas.  Do not douche or use tampons or scented sanitary pads.  Do not cross your legs for long periods of time.  Avoid cat litter boxes and soil used by cats. These carry germs that can cause birth defects in the baby and possibly loss of the fetus by miscarriage or stillbirth.  Avoid all smoking, herbs, alcohol, and medicines not prescribed by your health care provider. Chemicals in these affect the formation and growth of the baby.  Schedule a dentist appointment. At home, brush your teeth with a soft toothbrush and be gentle when you floss. SEEK MEDICAL CARE IF:   You have  dizziness.  You have mild pelvic cramps, pelvic pressure, or nagging pain in the abdominal area.  You have persistent nausea, vomiting, or diarrhea.  You have a bad smelling vaginal discharge.  You have pain with urination.  You notice increased swelling in your face, hands, legs, or ankles. SEEK IMMEDIATE MEDICAL CARE IF:   You have a fever.  You are leaking fluid from your vagina.  You have spotting or bleeding from your vagina.  You have severe abdominal cramping or pain.  You have rapid weight gain or loss.  You vomit blood or material that looks like coffee grounds.  You are exposed to German measles and have never had them.  You are exposed to fifth disease or chickenpox.  You develop a severe headache.  You have shortness of breath.  You have any kind of trauma, such as from a fall or a car accident. Document Released: 06/22/2001 Document Revised: 11/12/2013 Document Reviewed: 05/08/2013 [employer] Patient Information 2015 [employer]. This information is not intended to replace advice given to you by your health care provider. Make sure you discuss any questions you have with your health care provider.  

## 2014-08-19 NOTE — ED Provider Notes (Signed)
CSN: [id number]     Arrival date & time 08/19/14  1538 History   First MD Initiated Contact with Patient 08/19/14 1811     Chief Complaint  Patient presents with  . Seizures     (Consider location/radiation/quality/duration/timing/severity/associated sxs/prior Treatment) Patient is a 24 y.o. female presenting with seizures. The history is provided by the patient. No language interpreter was used.  Seizures Seizure activity on arrival: no   Seizure type:  Unable to specify Preceding symptoms: nausea   Initial focality:  None Episode characteristics: unresponsiveness   Episode characteristics: no abnormal movements   Postictal symptoms: no confusion   Return to baseline: yes   Severity:  Mild Duration:  5 seconds Timing:  Once Number of seizures this episode:  1 Progression:  Resolved Context: pregnancy   Recent head injury:  No recent head injuries PTA treatment:  None History of seizures: yes   Pt reports she deveolps  seizures when she is pregnant.  Pt reports abnormal periods   Past Medical History  Diagnosis Date  . Anemia   . Depression   . Complication of anesthesia     ??, seizure with wisdom teeth  . Urinary tract infection   . Psoriasis   . PID (acute pelvic inflammatory disease)   . Chlamydia   . PONV (postoperative nausea and vomiting)   . Seizures     July 2013 - [place]  . Headache(784.0)     otc med prn  . Anxiety   . Migraine    Past Surgical History  Procedure Laterality Date  . Cesarean section    . Skin graft      off abd, onto arm  . Wisdom tooth extraction    . Cesarean section  06/06/2012    Procedure: CESAREAN SECTION;  Surgeon: Adam Phenix, MD;  Location: [facility];  Service: Obstetrics;  Laterality: N/A;   Family History  Problem Relation Age of Onset  . Hypertension Mother   . Diabetes Mother   . Cancer Mother     BREAST  . Stroke Mother   . Seizures Mother   . Asthma Daughter   . Hypertension Maternal Grandmother   .  Diabetes Maternal Grandmother   . Cancer Maternal Grandmother     BONE CANCER  . Other Neg Hx    History  Substance Use Topics  . Smoking status: Never Smoker   . Smokeless tobacco: Never Used  . Alcohol Use: Yes     Comment: socially but none with pregnancy   OB History    Gravida Para Term Preterm AB TAB SAB Ectopic Multiple Living   0 1 1 0 0 0 2     Review of Systems  Neurological: Positive for seizures.  All other systems reviewed and are negative.     Allergies  Review of patient's allergies indicates no known allergies.  Home Medications   Prior to Admission medications   Medication Sig Start Date End Date Taking? Authorizing Provider  methocarbamol (ROBAXIN) 500 MG tablet Take 1 tablet (500 mg total) by mouth 2 (two) times daily as needed for muscle spasms. 07/26/14  Yes Einar Gip Dansie, PA-C  cephALEXin (KEFLEX) 500 MG capsule Take 1 capsule (500 mg total) by mouth 4 (four) times daily. Patient not taking: Reported on 08/19/2014 07/24/14   Emilia Beck, PA-C  diazepam (VALIUM) 5 MG tablet Take 1 tablet (5 mg total) by mouth 2 (two) times daily. Patient not taking: Reported on 07/24/2014 06/16/14  Shari A Upstill, PA-C  ibuprofen (ADVIL,MOTRIN) 600 MG tablet Take 1 tablet (600 mg total) by mouth every 6 (six) hours as needed. Patient not taking: Reported on 08/19/2014 06/24/14   Deirdre C Poe, CNM  naproxen (NAPROSYN) 500 MG tablet Take 1 tablet (500 mg total) by mouth 2 (two) times daily with a meal. Patient not taking: Reported on 08/19/2014 07/26/14   Einar GipWilliam Duncan Dansie, PA-C  ondansetron (ZOFRAN ODT) 4 MG disintegrating tablet Take 1 tablet (4 mg total) by mouth every 8 (eight) hours as needed for nausea or vomiting. Patient not taking: Reported on 08/19/2014 07/24/14   Emilia BeckKaitlyn Szekalski, PA-C  oxyCODONE-acetaminophen (PERCOCET/ROXICET) 5-325 MG per tablet Take 1 tablet by mouth every 4 (four) hours as needed. Patient not taking: Reported on 07/24/2014  06/24/14   Deirdre Colin Mulders Poe, CNM  oxyCODONE-acetaminophen (PERCOCET/ROXICET) 5-325 MG per tablet Take 1-2 tablets by mouth every 4 (four) hours as needed for moderate pain or severe pain. Patient not taking: Reported on 07/24/2014 06/24/14   Deirdre C Poe, CNM   BP 102/48 mmHg  Pulse 93  Temp(Src) 98.6 F (37 C) (Oral)  Resp 22  SpO2 100%  LMP 06/22/2014  Breastfeeding? No Physical Exam  Constitutional: She is oriented to person, place, and time. She appears well-developed and well-nourished.  HENT:  Head: Normocephalic.  Right Ear: External ear normal.  Left Ear: External ear normal.  Nose: Nose normal.  Mouth/Throat: Oropharynx is clear and moist.  Eyes: Conjunctivae and EOM are normal. Pupils are equal, round, and reactive to light.  Neck: Normal range of motion.  Cardiovascular: Normal rate and normal heart sounds.   Pulmonary/Chest: Effort normal and breath sounds normal.  Abdominal: Soft. She exhibits no distension.  Musculoskeletal: Normal range of motion.  Neurological: She is alert and oriented to person, place, and time.  Skin: Skin is warm.  Psychiatric: She has a normal mood and affect.  Nursing note and vitals reviewed.   ED Course  Procedures (including critical care time) Labs Review Labs Reviewed  PREGNANCY, URINE - Abnormal; Notable for the following:    Preg Test, Ur POSITIVE (*)    All other components within normal limits    Imaging Review No results found.   EKG Interpretation None      Pt reports [facility]   Pt qhcg is 848.  Probably early pregnancy.   No abdominal pain.  No bleeding.  Pt advised to schedule care.  [facility] if any problems.   Final diagnoses:  Pregnancy    Prenatal vitamins    Elson AreasLeslie K Sofia, PA-C 08/20/14 0022  Purvis SheffieldForrest Harrison, MD 08/20/14 [phone]

## 2014-08-19 NOTE — ED Notes (Signed)
Bedside report received from previous RN, Natalie. 

## 2014-08-19 NOTE — ED Notes (Signed)
Bed: WA16 Expected date:  Expected time:  Means of arrival:  Comments: EMS seizure 

## 2014-08-19 NOTE — Progress Notes (Signed)
  CARE MANAGEMENT ED NOTE 08/19/2014  Patient:  Chloe Rodgers,Chloe Rodgers   Account Number:  [id number]  Date Initiated:  08/19/2014  Documentation initiated by:  Radford PaxFERRERO,Brydan Downard  Subjective/Objective Assessment:   Patient presents to Ed post seizure     Subjective/Objective Assessment Detail:   Patient reports pmhx of seizures.  Patient noted to have 9 ED visits within the last six months     Action/Plan:   Action/Plan Detail:   Anticipated DC Date:       Status Recommendation to Physician:   Result of Recommendation:    Other ED Services  Consult Working Plan    DC Planning Services  Other  PCP issues    Choice offered to / List presented to:            Status of service:  Completed, signed off  ED Comments:   ED Comments Detail:  EDCM spoek to patient at bedside.  Patient confirms she has Medicaid insurance with [facility].  Patient reports she was seen by her pcp last week and was given medicine for her migraines.  Patient reports she was unable to get it filled because she could not afford $3 dollar copay.  Patient [facility] and was given a prescription for and antibiotic which she [facility] and copay was waved.  EDCM istructed patient to call [facility] and have [facility].  Patient reports she is [facility].  Patient reports she has not taken seizure medication in at least three years and is unsure of which medication it was.  [facility] asked patient if she had her prescription for seizure medication filled at any particular pharmacy?  Patient [place] at that time and does not remember which pharmacy it was.  Patient reports [facility] in the past.  Patient reports her next pcp appointment is March 2nd.  [facility] strongly encouraged patient to make follow up appointment with her pcp if she is dicharged form the ED.   EDCM instructed patient to tell her pcp office she was just seen in the ED and needs a follow up appointment.  Patient verbalized understanding.  No further EDCM needs at this time.

## 2014-08-26 ENCOUNTER — Other Ambulatory Visit: Payer: Medicaid Other

## 2014-08-27 ENCOUNTER — Other Ambulatory Visit: Payer: Medicaid Other

## 2014-08-27 DIAGNOSIS — O200 Threatened abortion: Secondary | ICD-10-CM

## 2014-08-27 LAB — HCG, QUANTITATIVE, PREGNANCY: hCG, Beta Chain, Quant, S: 329.9 m[IU]/mL

## 2014-08-27 NOTE — Progress Notes (Signed)
Patient here for follow up of bhcg for spotting in early pregnancy. Patient reports continued light spotting and no pain. Patient inquired about STD testing. Informed patient she was tested back in December for HIV and RPR and the results were normal and last month she was tested for gc/ch and the results were normal as well. Patient verbalized understanding and had no questions

## 2014-08-28 ENCOUNTER — Telehealth: Payer: Self-pay | Admitting: General Practice

## 2014-08-28 NOTE — Telephone Encounter (Signed)
Called patient, no answer- left message stating we are trying to reach you with some information, please call us back at the clinics 

## 2014-08-28 NOTE — Telephone Encounter (Signed)
-----   Message from Levie HeritageJacob J Stinson, DO sent at 08/28/2014  6:32 AM EST ----- Had drop in bHCG.  No need to follow up, unless she doesn't have period in 6-8 weeks.

## 2014-08-28 NOTE — Telephone Encounter (Signed)
Patient called back to front office. Informed patient of results and recommendations. Patient verbalized understanding and had no questions

## 2014-09-24 ENCOUNTER — Encounter (HOSPITAL_COMMUNITY): Payer: Self-pay | Admitting: Emergency Medicine

## 2014-09-24 ENCOUNTER — Emergency Department (HOSPITAL_COMMUNITY)
Admission: EM | Admit: 2014-09-24 | Discharge: 2014-09-24 | Disposition: A | Discharge status: Home / Self Care | Payer: Medicaid Other | Attending: Emergency Medicine | Admitting: Emergency Medicine

## 2014-09-24 DIAGNOSIS — Z872 Personal history of diseases of the skin and subcutaneous tissue: Secondary | ICD-10-CM | POA: Diagnosis not present

## 2014-09-24 DIAGNOSIS — Z87440 Personal history of urinary (tract) infections: Secondary | ICD-10-CM | POA: Diagnosis not present

## 2014-09-24 DIAGNOSIS — F419 Anxiety disorder, unspecified: Secondary | ICD-10-CM | POA: Insufficient documentation

## 2014-09-24 DIAGNOSIS — K0889 Other specified disorders of teeth and supporting structures: Secondary | ICD-10-CM

## 2014-09-24 DIAGNOSIS — Z862 Personal history of diseases of the blood and blood-forming organs and certain disorders involving the immune mechanism: Secondary | ICD-10-CM | POA: Diagnosis not present

## 2014-09-24 DIAGNOSIS — Z8742 Personal history of other diseases of the female genital tract: Secondary | ICD-10-CM | POA: Diagnosis not present

## 2014-09-24 DIAGNOSIS — Z3202 Encounter for pregnancy test, result negative: Secondary | ICD-10-CM | POA: Diagnosis not present

## 2014-09-24 DIAGNOSIS — Z8679 Personal history of other diseases of the circulatory system: Secondary | ICD-10-CM | POA: Insufficient documentation

## 2014-09-24 DIAGNOSIS — Z8619 Personal history of other infectious and parasitic diseases: Secondary | ICD-10-CM | POA: Insufficient documentation

## 2014-09-24 DIAGNOSIS — K029 Dental caries, unspecified: Secondary | ICD-10-CM | POA: Diagnosis not present

## 2014-09-24 DIAGNOSIS — K088 Other specified disorders of teeth and supporting structures: Secondary | ICD-10-CM | POA: Insufficient documentation

## 2014-09-24 LAB — POC URINE PREG, ED: Preg Test, Ur: NEGATIVE

## 2014-09-24 MED ORDER — HYDROCODONE-ACETAMINOPHEN 5-325 MG PO TABS: 1.0000 | ORAL_TABLET | Freq: Four times a day (QID) | ORAL | Status: DC | PRN

## 2014-09-24 MED ORDER — PENICILLIN V POTASSIUM 500 MG PO TABS: 500.0000 mg | ORAL_TABLET | Freq: Four times a day (QID) | ORAL | Status: AC

## 2014-09-24 MED ORDER — LIDOCAINE VISCOUS 2 % MT SOLN
15.0000 mL | Freq: Once | OROMUCOSAL | Status: AC
  Administered 2014-09-24: 15 mL via OROMUCOSAL
  Filled 2014-09-24: qty 15

## 2014-09-24 MED ORDER — HYDROCODONE-ACETAMINOPHEN 5-325 MG PO TABS
2.0000 | ORAL_TABLET | Freq: Once | ORAL | Status: AC
  Administered 2014-09-24: 2 via ORAL
  Filled 2014-09-24: qty 2

## 2014-09-24 NOTE — Discharge Instructions (Signed)
Dental Pain °A tooth ache may be caused by cavities (tooth decay). Cavities expose the nerve of the tooth to air and hot or cold temperatures. It may come from an infection or abscess (also called a boil or furuncle) around your tooth. It is also often caused by dental caries (tooth decay). This causes the pain you are having. °DIAGNOSIS  °Your caregiver can diagnose this problem by exam. °TREATMENT  °· If caused by an infection, it may be treated with medications which kill germs (antibiotics) and pain medications as prescribed by your caregiver. Take medications as directed. °· Only take over-the-counter or prescription medicines for pain, discomfort, or fever as directed by your caregiver. °· Whether the tooth ache today is caused by infection or dental disease, you should see your dentist as soon as possible for further care. °SEEK MEDICAL CARE IF: °The exam and treatment you received today has been provided on an emergency basis only. This is not a substitute for complete medical or dental care. If your problem worsens or new problems (symptoms) appear, and you are unable to meet with your dentist, call or return to this location. °SEEK IMMEDIATE MEDICAL CARE IF:  °· You have a fever. °· You develop redness and swelling of your face, jaw, or neck. °· You are unable to open your mouth. °· You have severe pain uncontrolled by pain medicine. °MAKE SURE YOU:  °· Understand these instructions. °· Will watch your condition. °· Will get help right away if you are not doing well or get worse. °Document Released: 06/28/2005 Document Revised: 09/20/2011 Document Reviewed: 02/14/2008 °ExitCare® Patient Information ©2015 [employer]. This information is not intended to replace advice given to you by your health care provider. Make sure you discuss any questions you have with your health care provider. ° °Emergency Department Resource Guide °1) Find a Doctor and Pay Out of Pocket °Although you won't have to find out who  is covered by your insurance plan, it is a good idea to ask around and get recommendations. You will then need to call the office and see if the doctor you have chosen will accept you as a new patient and what types of options they offer for patients who are self-pay. Some doctors offer discounts or will set up payment plans for their patients who do not have insurance, but you will need to ask so you aren't surprised when you get to your appointment. ° °2) Contact Your Local Health Department °Not all health departments have doctors that can see patients for sick visits, but many do, so it is worth a call to see if yours does. If you don't know where your local health department is, you can check in your phone book. The CDC also has a tool to help you locate your state's health department, and many state websites also have listings of all of their local health departments. ° °3) Find a Walk-in Clinic °If your illness is not likely to be very severe or complicated, you may want to try a walk in clinic. These are popping up all over the country in pharmacies, drugstores, and shopping centers. They're usually staffed by nurse practitioners or physician assistants that have been trained to treat common illnesses and complaints. They're usually fairly quick and inexpensive. However, if you have serious medical issues or chronic medical problems, these are probably not your best option. ° °No Primary Care Doctor: °- Call [facility] at  832-8000 - they can help you locate a primary   care doctor that  accepts your insurance, provides certain services, etc. °- Physician Referral Service- 1-800-533-3463 ° °Chronic Pain Problems: °Organization         Address  Phone   Notes  °[facility]  (336) 297-2271 Patients need to be referred by their primary care doctor.  ° °Medication Assistance: °Organization         Address  Phone   Notes  °[place] [facility] [address]  [address] °[place], [place] [address] (336) 641-8030 --Must be a resident of [place] °-- Must have NO insurance coverage whatsoever (no Medicaid/ Medicare, etc.) °-- The pt. MUST have a primary care doctor that directs their care regularly and follows them in the community °  °[facility]  (866) 331-1348   °[employer]  (888) 892-1162   ° °Agencies that provide inexpensive medical care: °Organization         Address  Phone   Notes  °[facility]  (336) 832-8035   °[facility]    (336) 832-7272   °[facility] [address] °[place], [place] [address] (336) 832-4777   °[facility] [address], °[place] (336) 271-4999   °[facility]    (336) 373-0678   °[facility]    (336) 272-1050   °[facility] ° [address], [place] Phone:  (336) 832-4444, Fax:  (336) 832-4440 Hours of Operation:  9 am - 6 pm, M-F.  Also accepts Medicaid/Medicare and self-pay.  °[facility] ° [address], [place] Phone: (336) 832-3150, Fax: (336) 832-3151. Hours of Operation:  8:30 am - 5:30 pm, M-F.  Also accepts Medicaid and self-pay.  °[facility] [place] [address], [place] Phone: (336) 878-6027   °[facility] [address], [place], [place] (336)723-1848, Ext. 123 Mondays & Thursdays: 7-9 AM.  First 15 patients are seen on a first come, first serve basis. °  ° °Medicaid-accepting [place] Providers: ° °Organization         Address  Phone   Notes  °[facility] [address], [place] (336) 641-2100 Also accepts self-pay patients.  °[facility] [address], [place] ° (336) 856-9996   °[facility] [address], [place] (336) 288-8857   °[facility] [address] [place] [address], [place] (336) 299-7000   °Veita Bland [address], [place]  ° (336) 373-1557 Only accepts [place] Access Medicaid patients after they have their name applied to their card.  ° °Self-Pay (no insurance) in [place]: ° °Organization         Address  Phone   Notes  °Sickle Cell Patients, [facility] [address], [place] (336) 832-1970   °[facility] [address], [place] (336) 832-4400   °[facility] ° [address], [place] (336) 992-4800   °[facility]/Dr. Osei-Bonsu ° [address] [place] [address], [place] or [address], [place] (336) 841-8500 Phone number for both [place] and [place] locations is the same.  °[facility] [address], [place] (336) 299-0000   °[facility] [address] [place] [address], [place] or [address] (336) 852-7530 °(336) 878-2260   °  [facility] [address], [place] (336) 350-1642, phone; (336) 294-5005, fax Sees patients 1st and 3rd Saturday of every month.  Must not qualify for public or private insurance (i.e. Medicaid, Medicare, [place] Health Choice, Veterans' Benefits) • Household income should be no more than 200% of the poverty level •The clinic cannot treat you if you are pregnant or think you are pregnant • Sexually transmitted diseases are not treated at the clinic.  ° ° °Dental Care: °Organization         Address  Phone  Notes  °[facility] [address], [place] (336) 641-6152 Accepts children up to age 21 who are enrolled in Medicaid or [place] Health Choice; pregnant women with a Medicaid card; and children who have applied for Medicaid or [place] Health Choice, but were declined, whose parents can pay a reduced fee at time of service.  °[facility] [place]  [address], [place] (336) 641-7733 Accepts children up to age 21 who are  enrolled in Medicaid or [place] Health Choice; pregnant women with a Medicaid card; and children who have applied for Medicaid or [place] Health Choice, but were declined, whose parents can pay a reduced fee at time of service.  °[facility] ° [address], [place] (336) 641-4533 Patients are seen by appointment only. Walk-ins are not accepted. [facility] will see patients 18 years of age and older. °Monday - Tuesday (8am-5pm) °Most Wednesdays (8:30-5pm) °$30 per visit, cash only  °[facility] ° [address], [place] (336) 641-4533 Patients are seen by appointment only. Walk-ins are not accepted. [facility] will see patients 18 years of age and older. °One Wednesday Evening (Monthly: Volunteer Based).  $30 per visit, cash only  °[facility]  (919) 537-3737 for adults; Children under age 4, call Graduate Pediatric Dentistry at (919) [phone]. Children aged 4-14, please call (919) [phone] to request a pediatric application. ° Dental services are provided in all areas of dental care including fillings, crowns and bridges, complete and partial dentures, implants, gum treatment, root canals, and extractions. Preventive care is also provided. Treatment is provided to both adults and children. °Patients are selected via a lottery and there is often a waiting list. °  °[facility] [address], °[place] ° (336) 763-8833 [email or web address] °  °[facility] [address], [place], [place] (336)723-1848, Ext. 123 Second and Fourth Thursday of each month, opens at 6:30 AM; Clinic ends at 9 AM.  Patients are seen on a first-come first-served basis, and a limited number are seen during each clinic.  ° °[facility] ° [address], [place], [place] (336) 723-7904   Eligibility Requirements °You must have lived in [place], [place], or [place] counties for at least the last three months. °  You  cannot be eligible for state or federal sponsored healthcare insurance, including Veterans Administration, Medicaid, or Medicare. °  You generally cannot be eligible for healthcare insurance through your employer.  °  How to apply: °Eligibility screenings are held every Tuesday and Wednesday afternoon from 1:00 pm until 4:00 pm. You do not need an appointment for the interview!  °[facility] [address], [place],  336-631-2330   °[facility]  336-342-8273   °[facility]  336-703-3100   °[facility]  336-570-6415   ° °Behavioral Health Resources in the Community: °Intensive Outpatient Programs °Organization         Address  Phone  Notes  °[facility] [address], [place], [place] 336-878-6098   °[facility] [address], [place], [place] 336-832-9800   °[facility] [address], [place], [place] ° 336-882-2125   °[facility] [address],  °[place], [place] 1-800-853-5163 or 336-641-4981   °Substance Abuse Resources °Organization         Address  Phone  Notes  °[facility]  336-882-2125   °[facility]  336-784-9470   °[facility]  336-285-9073   °[facility]  336-845-3988   °Residential & Outpatient Substance Abuse Program  1-800-659-3381   °Psychological Services °Organization         Address  Phone  Notes  °[facility]  336- 832-9600   °[facility]  336- 378-7881   °[facility] [address], [place] 1-800-853-5163 or 336-641-4981   ° °Mobile Crisis Teams °Organization         Address  Phone  Notes  °[facility], Mobile Crisis Care Unit  1-877-626-1772   °Assertive °[facility] ° [address] [place], [place] 336-834-9664   °Sharon DeEsch [address] °[place] 336-554-5454   ° °Self-Help/Support  Groups °Organization         Address  Phone             Notes  °[facility] [place] - variety of support groups  336- 373-1402 Call for more information  °Narcotics Anonymous (NA), [facility] [address], °[place]  2 meetings at this location  ° °Residential Treatment Programs °Organization         Address  Phone  Notes  °[facility] [address],    °[place]  1-866-801-8205   °[facility] ° [address], [place], [place] 704-293-8524   °[facility] [address], [place] 336-845-3988 Admissions: 8am-3pm M-F  °[facility] [address]    °[place], [place] 336-841-1104   °[facility] [address], [place], [place] 336-379-7146   °[facility] [address]  °[place], [place] 336-285-9073   °[facility] - Intensive Outpatient [address], , [place] 336-852-3033   °[facility] [address]  °[place], [place] 1-877-615-2722 or 336-784-9470   °[facility] [address] [place], [place] 336-227-7417 Accepts Medicaid  °[facility] [address]  ° [place] 1-800-659-3381 Substance Abuse/Addiction Treatment  ° °[place] Behavioral Health Resources °Organization         Address  Phone  Notes  °[facility]  (888) 581-9988   °Julie Brannon, PhD [address] [place], [place]   (336) 349-5553 or (336) 951-0000   °[facility]   [address] °[place], [place] (336) 349-4454   °[facility] [address], [place], [place] (336) 342-8316 Insurance/Medicaid/sponsorship through [facility]  °[facility] [address]                                    [place],  (336) 342-8316 Therapy/tele-psych/case  °[facility]   [address]  ° [place], [place] (336) 349-2233    °Dr. Arfeen  (336) 349-4544   °[facility] [place]  [facility]  [employer] [facility] [place] [facility] 1) [address],  °2) [address], [place] °3)  [address], [place] (336) 349-3220 °(336) 342-7768 ° °(336) 342-8140   °[place] [facility] (336) 342-1394 or (336) 342-3537 (After Hours)    ° ° ° °

## 2014-09-24 NOTE — ED Notes (Signed)
Pt c/o dental pain-- "4 back teeth"--pointing to top and bottom, left and right molars-- no obvious cavities/abcesses.

## 2014-09-24 NOTE — ED Provider Notes (Signed)
CSN: [id number]     Arrival date & time 09/24/14  2000 History  This chart was scribed for Ladona Mow, PA-C working with Jerelyn Scott, MD by Evon Slack, ED Scribe. This patient was seen in room TR07C/TR07C and the patient's care was started at 10:11 PM.    Chief Complaint  Patient presents with  . Dental Pain   The history is provided by the patient. No language interpreter was used.   HPI Comments: Chloe Rodgers is a 24 y.o. female who presents to the Emergency Department complaining of dental pain to her upper and lower molars onset 3 days prior. Pt states she has four filling in her rear molars. Pt states she has a dentist appointment in 3 days. Pt states she has been taking ibuprofen with no relief. Pt denies fever, chills or trouble swallowing.   Past Medical History  Diagnosis Date  . Anemia   . Depression   . Complication of anesthesia     ??, seizure with wisdom teeth  . Urinary tract infection   . Psoriasis   . PID (acute pelvic inflammatory disease)   . Chlamydia   . PONV (postoperative nausea and vomiting)   . Seizures     July 2013 - [place]  . Headache(784.0)     otc med prn  . Anxiety   . Migraine    Past Surgical History  Procedure Laterality Date  . Cesarean section    . Skin graft      off abd, onto arm  . Wisdom tooth extraction    . Cesarean section  06/06/2012    Procedure: CESAREAN SECTION;  Surgeon: Adam Phenix, MD;  Location: [facility];  Service: Obstetrics;  Laterality: N/A;   Family History  Problem Relation Age of Onset  . Hypertension Mother   . Diabetes Mother   . Cancer Mother     BREAST  . Stroke Mother   . Seizures Mother   . Asthma Daughter   . Hypertension Maternal Grandmother   . Diabetes Maternal Grandmother   . Cancer Maternal Grandmother     BONE CANCER  . Other Neg Hx    History  Substance Use Topics  . Smoking status: Never Smoker   . Smokeless tobacco: Never Used  . Alcohol Use: Yes     Comment: socially  but none with pregnancy   OB History    Gravida Para Term Preterm AB TAB SAB Ectopic Multiple Living   0 1 1 0 0 0 2     Review of Systems  Constitutional: Negative for fever and chills.  HENT: Positive for dental problem. Negative for trouble swallowing.       Allergies  Review of patient's allergies indicates no known allergies.  Home Medications   Prior to Admission medications   Medication Sig Start Date End Date Taking? Authorizing Provider  cephALEXin (KEFLEX) 500 MG capsule Take 1 capsule (500 mg total) by mouth 4 (four) times daily. Patient not taking: Reported on 08/19/2014 07/24/14   Emilia Beck, PA-C  diazepam (VALIUM) 5 MG tablet Take 1 tablet (5 mg total) by mouth 2 (two) times daily. Patient not taking: Reported on 07/24/2014 06/16/14   Elpidio Anis, PA-C  HYDROcodone-acetaminophen (NORCO/VICODIN) 5-325 MG per tablet Take 1-2 tablets by mouth every 6 (six) hours as needed. 09/24/14   Ladona Mow, PA-C  ibuprofen (ADVIL,MOTRIN) 600 MG tablet Take 1 tablet (600 mg total) by mouth every 6 (six) hours as needed. Patient not  taking: Reported on 08/19/2014 06/24/14   Deirdre Colin Mulders Poe, CNM  methocarbamol (ROBAXIN) 500 MG tablet Take 1 tablet (500 mg total) by mouth 2 (two) times daily as needed for muscle spasms. 07/26/14   Everlene FarrierWilliam Dansie, PA-C  naproxen (NAPROSYN) 500 MG tablet Take 1 tablet (500 mg total) by mouth 2 (two) times daily with a meal. Patient not taking: Reported on 08/19/2014 07/26/14   Everlene FarrierWilliam Dansie, PA-C  ondansetron (ZOFRAN ODT) 4 MG disintegrating tablet Take 1 tablet (4 mg total) by mouth every 8 (eight) hours as needed for nausea or vomiting. Patient not taking: Reported on 08/19/2014 07/24/14   Emilia BeckKaitlyn Szekalski, PA-C  oxyCODONE-acetaminophen (PERCOCET/ROXICET) 5-325 MG per tablet Take 1 tablet by mouth every 4 (four) hours as needed. Patient not taking: Reported on 07/24/2014 06/24/14   Deirdre Colin Mulders Poe, CNM  oxyCODONE-acetaminophen (PERCOCET/ROXICET) 5-325 MG  per tablet Take 1-2 tablets by mouth every 4 (four) hours as needed for moderate pain or severe pain. Patient not taking: Reported on 07/24/2014 06/24/14   Deirdre C Poe, CNM  penicillin v potassium (VEETID) 500 MG tablet Take 1 tablet (500 mg total) by mouth 4 (four) times daily. 09/24/14 10/01/14  Ladona MowJoe Ronrico Dupin, PA-C  Prenatal Vit-Fe Fumarate-FA (PRENATAL COMPLETE) 14-0.4 MG TABS One tablet a day  (Pharmacist may substitute andy prenatal with iron) 08/19/14   Elson AreasLeslie K Sofia, PA-C   BP 142/95 mmHg  Pulse 88  Temp(Src) 97.9 F (36.6 C) (Oral)  Resp 16  Ht 5\' 6"  (1.676 m)  Wt 210 lb (95.255 kg)  BMI 33.91 kg/m2  SpO2 100%  LMP 08/16/2014   Physical Exam  Constitutional: She is oriented to person, place, and time. She appears well-developed and well-nourished. No distress.  HENT:  Head: Normocephalic and atraumatic.  Mouth/Throat: Uvula is midline and oropharynx is clear and moist. No trismus in the jaw. Dental caries present. No oropharyngeal exudate, posterior oropharyngeal edema or posterior oropharyngeal erythema.  Tender and obvious dental caries right lower 1st molar, gum line intact, no erythema or swelling , floor of mouth is soft, no signs of surrounding of swelling or infection. No gross abscess  Eyes: Conjunctivae and EOM are normal.  Neck: Normal range of motion and full passive range of motion without pain. Neck supple. No spinous process tenderness and no muscular tenderness present. No rigidity. No tracheal deviation, no edema, no erythema and normal range of motion present.  Cardiovascular: Normal rate.   Pulmonary/Chest: Effort normal. No respiratory distress.  Musculoskeletal: Normal range of motion.  Neurological: She is alert and oriented to person, place, and time.  Skin: Skin is warm and dry.  Psychiatric: She has a normal mood and affect. Her behavior is normal.  Nursing note and vitals reviewed.   ED Course  Procedures (including critical care time) DIAGNOSTIC  STUDIES: Oxygen Saturation is 100% on RA, normal by my interpretation.    COORDINATION OF CARE: 10:16 PM-Discussed treatment plan with pt at bedside and pt agreed to plan.     Labs Review Labs Reviewed  POC URINE PREG, ED    Imaging Review No results found.   EKG Interpretation None      MDM   Final diagnoses:  Pain, dental   Patient with toothache.  No gross abscess.  Exam unconcerning for Ludwig's angina or spread of infection.  Will treat with penicillin and pain medicine.  Urged patient to follow-up with dentist.  Discussed return precautions with patient, patient verbalizes understanding and agreement of this plan. I encouraged patient  to call or return to the ER should she have any questions or concerns.  I personally performed the services described in this documentation, which was scribed in my presence. The recorded information has been reviewed and is accurate.  BP 142/95 mmHg  Pulse 88  Temp(Src) 97.9 F (36.6 C) (Oral)  Resp 16  Ht  (1.676 m)  Wt 210 lb (95.255 kg)  BMI 33.91 kg/m2  SpO2 100%  LMP 08/16/2014  Signed,  Ladona Mow, PA-C 1:47 AM      Ladona Mow, PA-C 09/25/14 0146  Ladona Mow, PA-C 09/25/14 0147  Jerelyn Scott, MD 09/25/14 [phone]

## 2014-09-24 NOTE — ED Notes (Signed)
Declined W/C at D/C and was escorted to lobby by RN. 

## 2014-10-15 ENCOUNTER — Encounter (HOSPITAL_COMMUNITY): Payer: Self-pay | Admitting: Emergency Medicine

## 2014-10-15 ENCOUNTER — Emergency Department (HOSPITAL_COMMUNITY)
Admission: EM | Admit: 2014-10-15 | Discharge: 2014-10-15 | Disposition: A | Discharge status: Home / Self Care | Payer: Medicaid Other | Attending: Emergency Medicine | Admitting: Emergency Medicine

## 2014-10-15 ENCOUNTER — Emergency Department (HOSPITAL_COMMUNITY): Payer: Medicaid Other

## 2014-10-15 DIAGNOSIS — Z792 Long term (current) use of antibiotics: Secondary | ICD-10-CM | POA: Insufficient documentation

## 2014-10-15 DIAGNOSIS — Z79899 Other long term (current) drug therapy: Secondary | ICD-10-CM | POA: Insufficient documentation

## 2014-10-15 DIAGNOSIS — G40909 Epilepsy, unspecified, not intractable, without status epilepticus: Secondary | ICD-10-CM | POA: Insufficient documentation

## 2014-10-15 DIAGNOSIS — Z8679 Personal history of other diseases of the circulatory system: Secondary | ICD-10-CM | POA: Diagnosis not present

## 2014-10-15 DIAGNOSIS — Z791 Long term (current) use of non-steroidal anti-inflammatories (NSAID): Secondary | ICD-10-CM | POA: Insufficient documentation

## 2014-10-15 DIAGNOSIS — Y9289 Other specified places as the place of occurrence of the external cause: Secondary | ICD-10-CM | POA: Diagnosis not present

## 2014-10-15 DIAGNOSIS — Z8619 Personal history of other infectious and parasitic diseases: Secondary | ICD-10-CM | POA: Insufficient documentation

## 2014-10-15 DIAGNOSIS — Z8742 Personal history of other diseases of the female genital tract: Secondary | ICD-10-CM | POA: Insufficient documentation

## 2014-10-15 DIAGNOSIS — D649 Anemia, unspecified: Secondary | ICD-10-CM | POA: Diagnosis not present

## 2014-10-15 DIAGNOSIS — Y9389 Activity, other specified: Secondary | ICD-10-CM | POA: Insufficient documentation

## 2014-10-15 DIAGNOSIS — Y998 Other external cause status: Secondary | ICD-10-CM | POA: Insufficient documentation

## 2014-10-15 DIAGNOSIS — M542 Cervicalgia: Secondary | ICD-10-CM

## 2014-10-15 DIAGNOSIS — F419 Anxiety disorder, unspecified: Secondary | ICD-10-CM | POA: Insufficient documentation

## 2014-10-15 DIAGNOSIS — Z87440 Personal history of urinary (tract) infections: Secondary | ICD-10-CM | POA: Insufficient documentation

## 2014-10-15 DIAGNOSIS — Z872 Personal history of diseases of the skin and subcutaneous tissue: Secondary | ICD-10-CM | POA: Diagnosis not present

## 2014-10-15 DIAGNOSIS — S199XXA Unspecified injury of neck, initial encounter: Secondary | ICD-10-CM | POA: Diagnosis not present

## 2014-10-15 DIAGNOSIS — S0993XA Unspecified injury of face, initial encounter: Secondary | ICD-10-CM | POA: Diagnosis not present
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
## 2014-10-15 NOTE — Discharge Instructions (Signed)
You may take Tylenol as directed for pain.  Assault, General Assault includes any behavior, whether intentional or reckless, which results in bodily injury to another person and/or damage to property. Included in this would be any behavior, intentional or reckless, that by its nature would be understood (interpreted) by a reasonable person as intent to harm another person or to damage his/her property. Threats may be oral or written. They may be communicated through regular mail, computer, fax, or phone. These threats may be direct or implied. FORMS OF ASSAULT INCLUDE:  Physically assaulting a person. This includes physical threats to inflict physical harm as well as:  Slapping.  Hitting.  Poking.  Kicking.  Punching.  Pushing.  Arson.  Sabotage.  Equipment vandalism.  Damaging or destroying property.  Throwing or hitting objects.  Displaying a weapon or an object that appears to be a weapon in a threatening manner.  Carrying a firearm of any kind.  Using a weapon to harm someone.  Using greater physical size/strength to intimidate another.  Making intimidating or threatening gestures.  Bullying.  Hazing.  Intimidating, threatening, hostile, or abusive language directed toward another person.  It communicates the intention to engage in violence against that person. And it leads a reasonable person to expect that violent behavior may occur.  Stalking another person. IF IT HAPPENS AGAIN:  Immediately call for emergency help (911 in [place]  If someone poses clear and immediate danger to you, seek legal authorities to have a protective or restraining order put in place.  Less threatening assaults can at least be reported to authorities. STEPS TO TAKE IF A SEXUAL ASSAULT HAS HAPPENED  Go to an area of safety. This may include a shelter or staying with a friend. Stay away from the area where you have been attacked. A large percentage of sexual assaults are caused  by a friend, relative or associate.  If medications were given by your caregiver, take them as directed for the full length of time prescribed.  Only take over-the-counter or prescription medicines for pain, discomfort, or fever as directed by your caregiver.  If you have come in contact with a sexual disease, find out if you are to be tested again. If your caregiver is concerned about the HIV/AIDS virus, he/she may require you to have continued testing for several months.  For the protection of your privacy, test results can not be given over the phone. Make sure you receive the results of your test. If your test results are not back during your visit, make an appointment with your caregiver to find out the results. Do not assume everything is normal if you have not heard from your caregiver or the medical facility. It is important for you to follow up on all of your test results.  File appropriate papers with authorities. This is important in all assaults, even if it has occurred in a family or by a friend. SEEK MEDICAL CARE IF:  You have new problems because of your injuries.  You have problems that may be because of the medicine you are taking, such as:  Rash.  Itching.  Swelling.  Trouble breathing.  You develop belly (abdominal) pain, feel sick to your stomach (nausea) or are vomiting.  You begin to run a temperature.  You need supportive care or referral to a rape crisis center. These are centers with trained personnel who can help you get through this ordeal. SEEK IMMEDIATE MEDICAL CARE IF:  You are afraid of being threatened,  beaten, or abused. In [place] call 911.  You receive new injuries related to abuse.  You develop severe pain in any area injured in the assault or have any change in your condition that concerns you.  You faint or lose consciousness.  You develop chest pain or shortness of breath. Document Released: 06/28/2005 Document Revised: 09/20/2011 Document  Reviewed: 02/14/2008 [facility] Patient Information 2015 [place], [place]. This information is not intended to replace advice given to you by your health care provider. Make sure you discuss any questions you have with your health care provider.

## 2015-01-06 ENCOUNTER — Encounter (HOSPITAL_COMMUNITY): Payer: Self-pay | Admitting: *Deleted

## 2015-01-06 ENCOUNTER — Emergency Department (HOSPITAL_COMMUNITY)
Admission: EM | Admit: 2015-01-06 | Discharge: 2015-01-06 | Disposition: A | Discharge status: Home / Self Care | Payer: Medicaid Other | Attending: Emergency Medicine | Admitting: Emergency Medicine

## 2015-01-06 DIAGNOSIS — S0081XA Abrasion of other part of head, initial encounter: Secondary | ICD-10-CM

## 2015-01-06 DIAGNOSIS — Y9289 Other specified places as the place of occurrence of the external cause: Secondary | ICD-10-CM | POA: Diagnosis not present

## 2015-01-06 DIAGNOSIS — Z23 Encounter for immunization: Secondary | ICD-10-CM | POA: Diagnosis not present

## 2015-01-06 DIAGNOSIS — Z8619 Personal history of other infectious and parasitic diseases: Secondary | ICD-10-CM | POA: Diagnosis not present

## 2015-01-06 DIAGNOSIS — Y9389 Activity, other specified: Secondary | ICD-10-CM | POA: Diagnosis not present

## 2015-01-06 DIAGNOSIS — Z8659 Personal history of other mental and behavioral disorders: Secondary | ICD-10-CM | POA: Diagnosis not present

## 2015-01-06 DIAGNOSIS — Z872 Personal history of diseases of the skin and subcutaneous tissue: Secondary | ICD-10-CM | POA: Diagnosis not present

## 2015-01-06 DIAGNOSIS — Y998 Other external cause status: Secondary | ICD-10-CM | POA: Insufficient documentation

## 2015-01-06 DIAGNOSIS — Z87440 Personal history of urinary (tract) infections: Secondary | ICD-10-CM | POA: Diagnosis not present

## 2015-01-06 DIAGNOSIS — S0031XA Abrasion of nose, initial encounter: Secondary | ICD-10-CM | POA: Diagnosis not present

## 2015-01-06 DIAGNOSIS — Z8669 Personal history of other diseases of the nervous system and sense organs: Secondary | ICD-10-CM | POA: Diagnosis not present

## 2015-01-06 DIAGNOSIS — Z862 Personal history of diseases of the blood and blood-forming organs and certain disorders involving the immune mechanism: Secondary | ICD-10-CM | POA: Diagnosis not present

## 2015-01-06 DIAGNOSIS — S0083XA Contusion of other part of head, initial encounter: Secondary | ICD-10-CM

## 2015-01-06 DIAGNOSIS — S0993XA Unspecified injury of face, initial encounter: Secondary | ICD-10-CM | POA: Diagnosis present

## 2015-01-06 MED ORDER — METHOCARBAMOL 500 MG PO TABS
500.0000 mg | ORAL_TABLET | Freq: Once | ORAL | Status: AC
  Administered 2015-01-06: 500 mg via ORAL
  Filled 2015-01-06: qty 1

## 2015-01-06 MED ORDER — HYDROCODONE-ACETAMINOPHEN 5-325 MG PO TABS
2.0000 | ORAL_TABLET | Freq: Once | ORAL | Status: AC
  Administered 2015-01-06: 2 via ORAL
  Filled 2015-01-06: qty 2

## 2015-01-06 MED ORDER — TETANUS-DIPHTH-ACELL PERTUSSIS 5-2.5-18.5 LF-MCG/0.5 IM SUSP
0.5000 mL | Freq: Once | INTRAMUSCULAR | Status: AC
  Administered 2015-01-06: 0.5 mL via INTRAMUSCULAR
  Filled 2015-01-06: qty 0.5
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
## 2015-01-06 NOTE — Discharge Instructions (Signed)
1. Medications: Ibuprofen or Tylenol for pain, usual home medications 2. Treatment: rest, drink plenty of fluids, apply ice, clean open wounds with warm soap and water 3. Follow Up: Please followup with your primary doctor in 3 days for discussion of your diagnoses and further evaluation after today's visit; if you do not have a primary care doctor use the resource guide provided to find one; Please return to the ER for worsening symptoms including seizures, worsening headache or other concerns    Assault, General Assault includes any behavior, whether intentional or reckless, which results in bodily injury to another person and/or damage to property. Included in this would be any behavior, intentional or reckless, that by its nature would be understood (interpreted) by a reasonable person as intent to harm another person or to damage his/her property. Threats may be oral or written. They may be communicated through regular mail, computer, fax, or phone. These threats may be direct or implied. FORMS OF ASSAULT INCLUDE:  Physically assaulting a person. This includes physical threats to inflict physical harm as well as:  Slapping.  Hitting.  Poking.  Kicking.  Punching.  Pushing.  Arson.  Sabotage.  Equipment vandalism.  Damaging or destroying property.  Throwing or hitting objects.  Displaying a weapon or an object that appears to be a weapon in a threatening manner.  Carrying a firearm of any kind.  Using a weapon to harm someone.  Using greater physical size/strength to intimidate another.  Making intimidating or threatening gestures.  Bullying.  Hazing.  Intimidating, threatening, hostile, or abusive language directed toward another person.  It communicates the intention to engage in violence against that person. And it leads a reasonable person to expect that violent behavior may occur.  Stalking another person. IF IT HAPPENS AGAIN:  Immediately call for  emergency help (911 in [place]  If someone poses clear and immediate danger to you, seek legal authorities to have a protective or restraining order put in place.  Less threatening assaults can at least be reported to authorities. STEPS TO TAKE IF A SEXUAL ASSAULT HAS HAPPENED  Go to an area of safety. This may include a shelter or staying with a friend. Stay away from the area where you have been attacked. A large percentage of sexual assaults are caused by a friend, relative or associate.  If medications were given by your caregiver, take them as directed for the full length of time prescribed.  Only take over-the-counter or prescription medicines for pain, discomfort, or fever as directed by your caregiver.  If you have come in contact with a sexual disease, find out if you are to be tested again. If your caregiver is concerned about the HIV/AIDS virus, he/she may require you to have continued testing for several months.  For the protection of your privacy, test results can not be given over the phone. Make sure you receive the results of your test. If your test results are not back during your visit, make an appointment with your caregiver to find out the results. Do not assume everything is normal if you have not heard from your caregiver or the medical facility. It is important for you to follow up on all of your test results.  File appropriate papers with authorities. This is important in all assaults, even if it has occurred in a family or by a friend. SEEK MEDICAL CARE IF:  You have new problems because of your injuries.  You have problems that may be because of  the medicine you are taking, such as:  Rash.  Itching.  Swelling.  Trouble breathing.  You develop belly (abdominal) pain, feel sick to your stomach (nausea) or are vomiting.  You begin to run a temperature.  You need supportive care or referral to a rape crisis center. These are centers with trained personnel who  can help you get through this ordeal. SEEK IMMEDIATE MEDICAL CARE IF:  You are afraid of being threatened, beaten, or abused. In [place] call 911.  You receive new injuries related to abuse.  You develop severe pain in any area injured in the assault or have any change in your condition that concerns you.  You faint or lose consciousness.  You develop chest pain or shortness of breath. Document Released: 06/28/2005 Document Revised: 09/20/2011 Document Reviewed: 02/14/2008 [facility] Patient Information 2015 [place], [place]. This information is not intended to replace advice given to you by your health care provider. Make sure you discuss any questions you have with your health care provider.

## 2015-02-06 ENCOUNTER — Encounter: Payer: Self-pay | Admitting: Medical

## 2015-02-06 ENCOUNTER — Other Ambulatory Visit (HOSPITAL_COMMUNITY)
Admission: RE | Admit: 2015-02-06 | Discharge: 2015-02-06 | Disposition: A | Discharge status: Home / Self Care | Payer: Medicaid Other | Source: Ambulatory Visit | Attending: Medical | Admitting: Medical

## 2015-02-06 ENCOUNTER — Ambulatory Visit (INDEPENDENT_AMBULATORY_CARE_PROVIDER_SITE_OTHER): Payer: Self-pay | Admitting: Medical

## 2015-02-06 VITALS — BP 123/74 | HR 78 | Temp 98.0°F | Resp 16 | Ht 66.0 in | Wt 218.8 lb

## 2015-02-06 DIAGNOSIS — Z01419 Encounter for gynecological examination (general) (routine) without abnormal findings: Secondary | ICD-10-CM | POA: Insufficient documentation

## 2015-02-06 DIAGNOSIS — Z113 Encounter for screening for infections with a predominantly sexual mode of transmission: Secondary | ICD-10-CM | POA: Diagnosis present

## 2015-02-06 DIAGNOSIS — R102 Pelvic and perineal pain: Secondary | ICD-10-CM

## 2015-02-06 LAB — POCT URINALYSIS DIP (DEVICE)
Bilirubin Urine: NEGATIVE
Glucose, UA: NEGATIVE mg/dL
Ketones, ur: NEGATIVE mg/dL
Leukocytes, UA: NEGATIVE
Nitrite: NEGATIVE
Protein, ur: NEGATIVE mg/dL
Specific Gravity, Urine: 1.025 (ref 1.005–1.030)
Urobilinogen, UA: 0.2 mg/dL (ref 0.0–1.0)
pH: 7 (ref 5.0–8.0)

## 2015-02-06 LAB — POCT PREGNANCY, URINE: Preg Test, Ur: NEGATIVE

## 2015-02-06 MED ORDER — OXYCODONE-ACETAMINOPHEN 5-325 MG PO TABS: 1.0000 | ORAL_TABLET | ORAL | Status: DC | PRN

## 2015-02-06 MED ORDER — IBUPROFEN 600 MG PO TABS: 600.0000 mg | ORAL_TABLET | Freq: Four times a day (QID) | ORAL | Status: DC | PRN

## 2015-02-06 NOTE — Progress Notes (Signed)
Patient ID: Donovan Gatchel, female   DOB: 15-Jun-1991, 24 y.o.   MRN: [id number]  History:  Ms. Tylena Prisk is a 24 y.o. [id number] who presents to clinic today for low back and pelvic pain. She states pain is sharp and shooting x weeks. She is not taking anything for pain. She states LMP mid July. She states spotting last week and today. She denies vaginal discharge. She is sexually active and uses condoms. She is also concerned about bloating and recent dyspnea on exertion. She feels that pain is worse with ambulation and better with rest. She has also had intermittent nausea without vomiting, diarrhea or constipation and occasional headaches. She does have a PCP with [facility].     Patient Active Problem List   Diagnosis Date Noted  . Chlamydia trachomatis infection 04/27/2013  . PID (acute pelvic inflammatory disease) 02/22/2013  . Nephrolithiasis 02/22/2013  . Migraines 07/24/2012  . Family conflict 06/08/2012  . Obese 03/30/2012  . Seizure disorder 02/03/2012  . Family history of breast cancer in mother 02/03/2012    No Known Allergies   The following portions of the patient's history were reviewed and updated as appropriate: allergies, current medications, family history, past medical history, social history, past surgical history and problem list.  Review of Systems:  Other than those mentioned in HPI all ROS negative  Objective:  Physical Exam BP 123/74 mmHg  Pulse 78  Temp(Src) 98 F (36.7 C) (Oral)  Resp 16  Ht 5\' 6"  (1.676 m)  Wt 218 lb 12.8 oz (99.247 kg)  BMI 35.33 kg/m2  LMP 01/23/2015 (Approximate) CONSTITUTIONAL: Well-developed, well-nourished female in no acute distress.  EYES: EOM intact, conjunctivae normal, no scleral icterus HEAD: Normocephalic, atraumatic ENT: External right and left ear normal, oropharynx is clear and moist. CARDIOVASCULAR: Normal heart rate noted, regular rhythm. No cyanosis or edema. 2+ distal pulses.  RESPIRATORY: Clear to  auscultation bilaterally. Effort and breath sounds normal, no problems with respiration noted. GASTROINTESTINAL:Soft, normal bowel sounds, no distention noted.  No tenderness, rebound or guarding.  GENITOURINARY: Normal appearing external genitalia; normal appearing vaginal mucosa and cervix.  Yellow, mucus discharge.  Pap smear and wet prep obtained.  Normal uterine size, no other palpable masses, no uterine tenderness. Mild right adnexal tenderness.  MUSCULOSKELETAL: Normal range of motion. No tenderness. SKIN: Skin is warm and dry. No rash noted. Not diaphoretic. No erythema. No pallor. NEUROLGIC: Alert and oriented to person, place, and time. Normal reflexes, muscle tone, coordination. No cranial nerve deficit noted. PSYCHIATRIC: Normal mood and affect. Normal behavior. Normal judgment and thought content.  Labs and Imaging Results for orders placed or performed in visit on 02/06/15 (from the past 24 hour(s))  Pregnancy, urine POC     Status: None   Collection Time: 02/06/15  2:41 PM  Result Value Ref Range   Preg Test, Ur NEGATIVE NEGATIVE  POCT urinalysis dip (device)     Status: Abnormal   Collection Time: 02/06/15  2:54 PM  Result Value Ref Range   Glucose, UA NEGATIVE NEGATIVE mg/dL   Bilirubin Urine NEGATIVE NEGATIVE   Ketones, ur NEGATIVE NEGATIVE mg/dL   Specific Gravity, Urine 1.025 1.005 - 1.030   Hgb urine dipstick TRACE (A) NEGATIVE   pH 7.0 5.0 - 8.0   Protein, ur NEGATIVE NEGATIVE mg/dL   Urobilinogen, UA 0.2 0.0 - 1.0 mg/dL   Nitrite NEGATIVE NEGATIVE   Leukocytes, UA NEGATIVE NEGATIVE     Assessment & Plan:  Assessment: Pelvic pain in female  Back pain  Plans: UA, wet prep, Pap smear performed today [place] ordered for next week Rx for Percocet and Ibuprofen given to patient Patient encouraged to contact PCP for further evaluation of back pain Patient to return to [facility] as needed or if symptoms were to change or worsen  Marny Lowenstein, PA-C 02/06/2015 2:59  PM

## 2015-02-06 NOTE — Patient Instructions (Signed)
Pelvic Pain Pelvic pain is pain felt below the belly button and between your hips. It can be caused by many different things. It is important to get help right away. This is especially true for severe, sharp, or unusual pain that comes on suddenly.  HOME CARE  Only take medicine as told by your doctor.  Rest as told by your doctor.  Eat a healthy diet, such as fruits, vegetables, and lean meats.  Drink enough fluids to keep your pee (urine) clear or pale yellow, or as told.  Avoid sex (intercourse) if it causes pain.  Apply warm or cold packs to your lower belly (abdomen). Use the type of pack that helps the pain.  Avoid situations that cause you stress.  Keep a journal to track your pain. Write down:  When the pain started.  Where it is located.  If there are things that seem to be related to the pain, such as food or your period.  Follow up with your doctor as told. GET HELP RIGHT AWAY IF:   You have heavy bleeding from the vagina.  You have more pelvic pain.  You feel lightheaded or pass out (faint).  You have chills.  You have pain when you pee or have blood in your pee.  You cannot stop having watery poop (diarrhea).  You cannot stop throwing up (vomiting).  You have a fever or lasting symptoms for more than 3 days.  You have a fever and your symptoms suddenly get worse.  You are being physically or sexually abused.  Your medicine does not help your pain.  You have fluid (discharge) coming from your vagina that is not normal. MAKE SURE YOU:  Understand these instructions.  Will watch your condition.  Will get help if you are not doing well or get worse. Document Released: 12/15/2007 Document Revised: 12/28/2011 Document Reviewed: 10/18/2011 [employer] Patient Information 2015 [employer]. This information is not intended to replace advice given to you by your health care provider. Make sure you discuss any questions you have with your health care  provider.  

## 2015-02-06 NOTE — Progress Notes (Signed)
Patient ID: Chloe Rodgers, female   DOB: 08-May-1991, 24 y.o.   MRN: [id number] UPT negative

## 2015-02-07 ENCOUNTER — Other Ambulatory Visit: Payer: Self-pay | Admitting: Medical

## 2015-02-07 ENCOUNTER — Telehealth: Payer: Self-pay | Admitting: General Practice

## 2015-02-07 DIAGNOSIS — B9689 Other specified bacterial agents as the cause of diseases classified elsewhere: Secondary | ICD-10-CM

## 2015-02-07 DIAGNOSIS — N760 Acute vaginitis: Principal | ICD-10-CM

## 2015-02-07 LAB — WET PREP, GENITAL
Trich, Wet Prep: NONE SEEN
Yeast Wet Prep HPF POC: NONE SEEN

## 2015-02-07 LAB — CYTOLOGY - PAP

## 2015-02-07 MED ORDER — METRONIDAZOLE 500 MG PO TABS: 500.0000 mg | ORAL_TABLET | Freq: Two times a day (BID) | ORAL | Status: DC

## 2015-02-07 NOTE — Telephone Encounter (Signed)
Telephone call to patient regarding + BV and antibiotic sent to pharmacy. Informed patient and recommended avoiding alcohol while on medication. Patient verbalized understanding and had no questions

## 2015-02-15 ENCOUNTER — Encounter (HOSPITAL_COMMUNITY): Payer: Self-pay | Admitting: Emergency Medicine

## 2015-02-15 ENCOUNTER — Emergency Department (HOSPITAL_COMMUNITY): Payer: Medicaid Other

## 2015-02-15 ENCOUNTER — Emergency Department (HOSPITAL_COMMUNITY)
Admission: EM | Admit: 2015-02-15 | Discharge: 2015-02-15 | Disposition: A | Discharge status: Home / Self Care | Payer: Medicaid Other | Attending: Emergency Medicine | Admitting: Emergency Medicine

## 2015-02-15 DIAGNOSIS — Z8659 Personal history of other mental and behavioral disorders: Secondary | ICD-10-CM | POA: Diagnosis not present

## 2015-02-15 DIAGNOSIS — Z87440 Personal history of urinary (tract) infections: Secondary | ICD-10-CM | POA: Insufficient documentation

## 2015-02-15 DIAGNOSIS — Z8619 Personal history of other infectious and parasitic diseases: Secondary | ICD-10-CM | POA: Diagnosis not present

## 2015-02-15 DIAGNOSIS — J020 Streptococcal pharyngitis: Secondary | ICD-10-CM | POA: Diagnosis not present

## 2015-02-15 DIAGNOSIS — Z862 Personal history of diseases of the blood and blood-forming organs and certain disorders involving the immune mechanism: Secondary | ICD-10-CM | POA: Insufficient documentation

## 2015-02-15 DIAGNOSIS — M545 Low back pain: Secondary | ICD-10-CM | POA: Insufficient documentation

## 2015-02-15 DIAGNOSIS — Z3202 Encounter for pregnancy test, result negative: Secondary | ICD-10-CM | POA: Diagnosis not present

## 2015-02-15 DIAGNOSIS — R102 Pelvic and perineal pain: Secondary | ICD-10-CM

## 2015-02-15 DIAGNOSIS — N739 Female pelvic inflammatory disease, unspecified: Secondary | ICD-10-CM | POA: Insufficient documentation

## 2015-02-15 DIAGNOSIS — Z8679 Personal history of other diseases of the circulatory system: Secondary | ICD-10-CM | POA: Insufficient documentation

## 2015-02-15 DIAGNOSIS — N730 Acute parametritis and pelvic cellulitis: Secondary | ICD-10-CM

## 2015-02-15 DIAGNOSIS — R51 Headache: Secondary | ICD-10-CM | POA: Diagnosis present

## 2015-02-15 LAB — COMPREHENSIVE METABOLIC PANEL
ALT: 14 U/L (ref 14–54)
AST: 19 U/L (ref 15–41)
Albumin: 3.8 g/dL (ref 3.5–5.0)
Alkaline Phosphatase: 62 U/L (ref 38–126)
Anion gap: 11 (ref 5–15)
BUN: 11 mg/dL (ref 6–20)
CO2: 22 mmol/L (ref 22–32)
Calcium: 9.1 mg/dL (ref 8.9–10.3)
Chloride: 103 mmol/L (ref 101–111)
Creatinine, Ser: 0.78 mg/dL (ref 0.44–1.00)
GFR calc Af Amer: >60 mL/min (ref >60)
GFR calc non Af Amer: >60 mL/min (ref >60)
Glucose, Bld: 107 mg/dL — ABNORMAL HIGH (ref 65–99)
Potassium: 3.3 mmol/L — ABNORMAL LOW (ref 3.5–5.1)
Sodium: 136 mmol/L (ref 135–145)
Total Bilirubin: 0.7 mg/dL (ref 0.3–1.2)
Total Protein: 7.6 g/dL (ref 6.5–8.1)

## 2015-02-15 LAB — CBC WITH DIFFERENTIAL/PLATELET
Basophils Absolute: 0 10*3/uL (ref 0.0–0.1)
Basophils Relative: 0 % (ref 0–1)
Eosinophils Absolute: 0 10*3/uL (ref 0.0–0.7)
Eosinophils Relative: 0 % (ref 0–5)
HCT: 35.2 % — ABNORMAL LOW (ref 36.0–46.0)
Hemoglobin: 11.7 g/dL — ABNORMAL LOW (ref 12.0–15.0)
Lymphocytes Relative: 8 % — ABNORMAL LOW (ref 12–46)
Lymphs Abs: 0.8 10*3/uL (ref 0.7–4.0)
MCH: 27.2 pg (ref 26.0–34.0)
MCHC: 33.2 g/dL (ref 30.0–36.0)
MCV: 81.9 fL (ref 78.0–100.0)
Monocytes Absolute: 1.1 10*3/uL — ABNORMAL HIGH (ref 0.1–1.0)
Monocytes Relative: 11 % (ref 3–12)
Neutro Abs: 8.2 10*3/uL — ABNORMAL HIGH (ref 1.7–7.7)
Neutrophils Relative %: 81 % — ABNORMAL HIGH (ref 43–77)
Platelets: 154 10*3/uL (ref 150–400)
RBC: 4.3 MIL/uL (ref 3.87–5.11)
RDW: 14.1 % (ref 11.5–15.5)
WBC: 10.1 10*3/uL (ref 4.0–10.5)

## 2015-02-15 LAB — URINALYSIS, ROUTINE W REFLEX MICROSCOPIC
Glucose, UA: NEGATIVE mg/dL
Hgb urine dipstick: NEGATIVE
Ketones, ur: 15 mg/dL — AB
Nitrite: NEGATIVE
Protein, ur: 30 mg/dL — AB
Specific Gravity, Urine: 1.041 — ABNORMAL HIGH (ref 1.005–1.030)
Urobilinogen, UA: 1 mg/dL (ref 0.0–1.0)
pH: 6.5 (ref 5.0–8.0)

## 2015-02-15 LAB — URINE MICROSCOPIC-ADD ON

## 2015-02-15 LAB — WET PREP, GENITAL
Clue Cells Wet Prep HPF POC: NONE SEEN
Trich, Wet Prep: NONE SEEN
Yeast Wet Prep HPF POC: NONE SEEN

## 2015-02-15 LAB — HIV ANTIBODY (ROUTINE TESTING W REFLEX): HIV Screen 4th Generation wRfx: NONREACTIVE

## 2015-02-15 LAB — RAPID STREP SCREEN (MED CTR MEBANE ONLY): Streptococcus, Group A Screen (Direct): POSITIVE — AB

## 2015-02-15 LAB — LIPASE, BLOOD: Lipase: 17 U/L — ABNORMAL LOW (ref 22–51)

## 2015-02-15 LAB — POC URINE PREG, ED: Preg Test, Ur: NEGATIVE

## 2015-02-15 MED ORDER — SODIUM CHLORIDE 0.9 % IV BOLUS (SEPSIS)
1000.0000 mL | Freq: Once | INTRAVENOUS | Status: AC
  Administered 2015-02-15: 1000 mL via INTRAVENOUS

## 2015-02-15 MED ORDER — DOXYCYCLINE HYCLATE 100 MG PO CAPS: 100.0000 mg | ORAL_CAPSULE | Freq: Two times a day (BID) | ORAL | Status: DC

## 2015-02-15 MED ORDER — DEXTROSE 5 % IV SOLN
1.0000 g | Freq: Once | INTRAVENOUS | Status: AC
  Administered 2015-02-15: 1 g via INTRAVENOUS
  Filled 2015-02-15: qty 10

## 2015-02-15 MED ORDER — ONDANSETRON 4 MG PO TBDP: 4.0000 mg | ORAL_TABLET | Freq: Once | ORAL | Status: DC

## 2015-02-15 MED ORDER — CEFTRIAXONE SODIUM 250 MG IJ SOLR: 250.0000 mg | Freq: Once | INTRAMUSCULAR | Status: DC

## 2015-02-15 MED ORDER — AZITHROMYCIN 250 MG PO TABS
1000.0000 mg | ORAL_TABLET | Freq: Once | ORAL | Status: AC
  Administered 2015-02-15: 1000 mg via ORAL
  Filled 2015-02-15: qty 4

## 2015-02-15 MED ORDER — METRONIDAZOLE IN NACL 5-0.79 MG/ML-% IV SOLN
500.0000 mg | Freq: Once | INTRAVENOUS | Status: AC
  Administered 2015-02-15: 500 mg via INTRAVENOUS
  Filled 2015-02-15: qty 100

## 2015-02-15 MED ORDER — ACETAMINOPHEN 500 MG PO TABS
1000.0000 mg | ORAL_TABLET | Freq: Once | ORAL | Status: AC
  Administered 2015-02-15: 1000 mg via ORAL
  Filled 2015-02-15: qty 2

## 2015-02-15 NOTE — ED Provider Notes (Signed)
CSN: [id number]     Arrival date & time 02/15/15  0132 History  This chart was scribed for Blane Ohara, MD by Freida Busman, ED Scribe. This patient was seen in room B17C/B17C and the patient's care was started 2:02 AM.    Chief Complaint  Patient presents with  . Headache  . Abdominal Pain    The history is provided by the patient. No language interpreter was used.    HPI Comments:  Chloe Rodgers is a 24 y.o. female who presents to the Emergency Department complaining of sharp constant lower abdominal pain for three days. She notes her pain radiates to her back. Pt reports h/o kidney stones states pain today is similar. Pt reports associated frontal HA, chills, mild cough, sore throat, SOB with exertion, nausea and vomiting. Pt has a h/o migraines and states today's HA is similar to past episodes. Pt was recently evaluated at [facility] for abdominal pain and vaginal discharge. She was placed Metrazol which she is still taking and  diagnosed with trichomonas. Pt notes her pain today is worse than the pain felt at that time. She denies CP, vaginal bleeding, and vaginal  discharge. No alleviating factors noted.   Past Medical History  Diagnosis Date  . Anemia   . Depression   . Complication of anesthesia     ??, seizure with wisdom teeth  . Urinary tract infection   . Psoriasis   . PID (acute pelvic inflammatory disease)   . Chlamydia   . PONV (postoperative nausea and vomiting)   . Seizures     July 2013 - [place]  . Headache(784.0)     otc med prn  . Anxiety   . Migraine    Past Surgical History  Procedure Laterality Date  . Cesarean section    . Skin graft      off abd, onto arm  . Wisdom tooth extraction    . Cesarean section  06/06/2012    Procedure: CESAREAN SECTION;  Surgeon: Adam Phenix, MD;  Location: [facility];  Service: Obstetrics;  Laterality: N/A;   Family History  Problem Relation Age of Onset  . Hypertension Mother   . Diabetes Mother   .  Cancer Mother     BREAST  . Stroke Mother   . Seizures Mother   . Asthma Daughter   . Hypertension Maternal Grandmother   . Diabetes Maternal Grandmother   . Cancer Maternal Grandmother     BONE CANCER  . Other Neg Hx    History  Substance Use Topics  . Smoking status: Never Smoker   . Smokeless tobacco: Never Used  . Alcohol Use: No     Comment: socially but none with pregnancy   OB History    Gravida Para Term Preterm AB TAB SAB Ectopic Multiple Living   4 2 2  0 1 1 0 0 0 2     Review of Systems  Constitutional: Positive for chills.  HENT: Positive for sore throat.   Respiratory: Positive for cough and shortness of breath.   Cardiovascular: Negative for chest pain.  Gastrointestinal: Positive for nausea and abdominal distention.  Genitourinary: Negative for vaginal bleeding and vaginal discharge.  Musculoskeletal: Positive for back pain.  Neurological: Positive for headaches.  All other systems reviewed and are negative.     Allergies  Review of patient's allergies indicates no known allergies.  Home Medications   Prior to Admission medications   Medication Sig Start Date End Date Taking? Authorizing Provider  ibuprofen (ADVIL,MOTRIN) 600 MG tablet Take 1 tablet (600 mg total) by mouth every 6 (six) hours as needed. Patient taking differently: Take 600 mg by mouth every 6 (six) hours as needed for moderate pain.  02/06/15  Yes Marny Lowenstein, PA-C  metroNIDAZOLE (FLAGYL) 500 MG tablet Take 1 tablet (500 mg total) by mouth 2 (two) times daily. 02/07/15  Yes Marny Lowenstein, PA-C  oxyCODONE-acetaminophen (PERCOCET/ROXICET) 5-325 MG per tablet Take 1 tablet by mouth every 4 (four) hours as needed for severe pain. 02/06/15  Yes Marny Lowenstein, PA-C  doxycycline (VIBRAMYCIN) 100 MG capsule Take 1 capsule (100 mg total) by mouth 2 (two) times daily. 02/15/15   Blane Ohara, MD   BP 114/73 mmHg  Pulse 87  Temp(Src) 100.2 F (37.9 C) (Oral)  Resp 16  Ht 5\' 6"  (1.676 m)   Wt 206 lb (93.441 kg)  BMI 33.27 kg/m2  SpO2 100%  LMP 01/23/2015 (Approximate) Physical Exam  Constitutional: She is oriented to person, place, and time. She appears well-developed and well-nourished. No distress.  HENT:  Head: Normocephalic and atraumatic.  Mouth/Throat: No oropharyngeal exudate.  Mild erythema posterior oropharynx  Clinically congested   Eyes: Conjunctivae are normal.  Neck: Normal range of motion. Neck supple.  Cardiovascular: Normal rate, regular rhythm and normal heart sounds.   Pulmonary/Chest: Effort normal and breath sounds normal. No respiratory distress.  Abdominal: Soft. She exhibits no distension.  No focal tenderness Pt describes lateral and lower abdominal tenderness  Genitourinary: Vaginal discharge found.  Mild adnexal CMT; moderate white discharge   Chaperone (RN) was present for exam which was performed with no discomfort or complications.   Neurological: She is alert and oriented to person, place, and time.  Skin: Skin is warm and dry.  Psychiatric: She has a normal mood and affect.  Nursing note and vitals reviewed.   ED Course  Procedures   DIAGNOSTIC STUDIES:  Oxygen Saturation is 100% on RA, nl by my interpretation.    COORDINATION OF CARE:  2:12 AM Discussed treatment plan with pt at bedside and pt agreed to plan.  Labs Review Labs Reviewed  RAPID STREP SCREEN (NOT AT [facility]) - Abnormal; Notable for the following:    Streptococcus, Group A Screen (Direct) POSITIVE (*)    All other components within normal limits  WET PREP, GENITAL - Abnormal; Notable for the following:    WBC, Wet Prep HPF POC FEW (*)    All other components within normal limits  URINALYSIS, ROUTINE W REFLEX MICROSCOPIC (NOT AT [facility]) - Abnormal; Notable for the following:    Color, Urine AMBER (*)    Specific Gravity, Urine 1.041 (*)    Bilirubin Urine SMALL (*)    Ketones, ur 15 (*)    Protein, ur 30 (*)    Leukocytes, UA SMALL (*)    All other components  within normal limits  COMPREHENSIVE METABOLIC PANEL - Abnormal; Notable for the following:    Potassium 3.3 (*)    Glucose, Bld 107 (*)    All other components within normal limits  CBC WITH DIFFERENTIAL/PLATELET - Abnormal; Notable for the following:    Hemoglobin 11.7 (*)    HCT 35.2 (*)    Neutrophils Relative % 81 (*)    Neutro Abs 8.2 (*)    Lymphocytes Relative 8 (*)    Monocytes Absolute 1.1 (*)    All other components within normal limits  LIPASE, BLOOD - Abnormal; Notable for the following:    Lipase  17 (*)    All other components within normal limits  URINE MICROSCOPIC-ADD ON - Abnormal; Notable for the following:    Squamous Epithelial / LPF FEW (*)    Bacteria, UA FEW (*)    All other components within normal limits  HIV ANTIBODY (ROUTINE TESTING)  POC URINE PREG, ED  GC/CHLAMYDIA PROBE AMP ([place]) NOT AT [facility]    Imaging Review US Transvaginal Non-ob  02/15/2015   CLINICAL DATA:  Pelvic pain  EXAM: TRANSABDOMINAL AND TRANSVAGINAL ULTRASOUND OF PELVIS  TECHNIQUE: Both transabdominal and transvaginal ultrasound examinations of the pelvis were performed. Transabdominal technique was performed for global imaging of the pelvis including uterus, ovaries, adnexal regions, and pelvic cul-de-sac. It was necessary to proceed with endovaginal exam following the transabdominal exam to visualize the ovaries.  COMPARISON:  None  FINDINGS: Uterus  Measurements: 7.3 x 4.2 x 4.9 cm. No fibroids or other mass visualized.  Endometrium  Thickness: 5.0 mm.  No focal abnormality visualized.  Right ovary  Measurements: 2.2 x 2.3 x 2.2 cm. Normal appearance/no adnexal mass.  Left ovary  Measurements: 1.9 x 1.8 x 2.3 cm. Normal appearance/no adnexal mass.  Other findings  Small volume free pelvic fluid in the cul-de-sac.  IMPRESSION: Small volume free pelvic fluid in the cul-de-sac. Normal uterus and ovaries.   Electronically Signed   By: Ellery Plunk M.D.   On: 02/15/2015 03:30   US Pelvis  Complete  02/15/2015   CLINICAL DATA:  Pelvic pain  EXAM: TRANSABDOMINAL AND TRANSVAGINAL ULTRASOUND OF PELVIS  TECHNIQUE: Both transabdominal and transvaginal ultrasound examinations of the pelvis were performed. Transabdominal technique was performed for global imaging of the pelvis including uterus, ovaries, adnexal regions, and pelvic cul-de-sac. It was necessary to proceed with endovaginal exam following the transabdominal exam to visualize the ovaries.  COMPARISON:  None  FINDINGS: Uterus  Measurements: 7.3 x 4.2 x 4.9 cm. No fibroids or other mass visualized.  Endometrium  Thickness: 5.0 mm.  No focal abnormality visualized.  Right ovary  Measurements: 2.2 x 2.3 x 2.2 cm. Normal appearance/no adnexal mass.  Left ovary  Measurements: 1.9 x 1.8 x 2.3 cm. Normal appearance/no adnexal mass.  Other findings  Small volume free pelvic fluid in the cul-de-sac.  IMPRESSION: Small volume free pelvic fluid in the cul-de-sac. Normal uterus and ovaries.   Electronically Signed   By: Ellery Plunk M.D.   On: 02/15/2015 03:30     EKG Interpretation None      MDM   Final diagnoses:  PID (acute pelvic inflammatory disease)  Strep pharyngitis   Patient presents with low-grade fever, lower abdominal discomfort and vaginal discharge. Clinical concern for PID with medical history, presentation and tenderness on exam. GC culture ordered and sent. Patient has mild sore throat with viral symptoms, strep test positive. Rocephin was given to cover multiple different pathologies. Treatment of PID and strep throat with close outpatient follow-up.  Patient improved in the ER, plan to send home with close follow-up for culture results and reassessment by women's clinic. Doxycycline for home. Ultrasound results reviewed no acute findings. Results and differential diagnosis were discussed with the patient/parent/guardian. Xrays were independently reviewed by myself.  Close follow up outpatient was discussed,  comfortable with the plan.   Medications  acetaminophen (TYLENOL) tablet 1,000 mg (1,000 mg Oral Given 02/15/15 0214)  azithromycin (ZITHROMAX) tablet 1,000 mg (1,000 mg Oral Given 02/15/15 0331)  cefTRIAXone (ROCEPHIN) 1 g in dextrose 5 % 50 mL IVPB (0 g Intravenous Stopped  02/15/15 0418)  sodium chloride 0.9 % bolus 1,000 mL (1,000 mLs Intravenous New Bag/Given 02/15/15 0333)  metroNIDAZOLE (FLAGYL) IVPB 500 mg (0 mg Intravenous Stopped 02/15/15 0550)    Filed Vitals:   02/15/15 0137 02/15/15 0449 02/15/15 0450 02/15/15 0536  BP: 117/65 112/80 112/80 114/73  Pulse:  85 96 87  Temp: 100.2 F (37.9 C)     TempSrc: Oral     Resp: Height:  (1.676 m)     Weight: 206 lb (93.441 kg)     SpO2:  100% 100% 100%    Final diagnoses:  PID (acute pelvic inflammatory disease)  Strep pharyngitis      Blane Ohara, MD 02/15/15 [phone]

## 2015-02-15 NOTE — ED Notes (Signed)
Pt to US.

## 2015-02-15 NOTE — ED Notes (Signed)
Pt stable, ambulatory, states understanding of discharge instructions 

## 2015-02-15 NOTE — Discharge Instructions (Signed)
Take antibiotics as discussed. If you were given medicines take as directed.  If you are on coumadin or contraceptives realize their levels and effectiveness is altered by many different medicines.  If you have any reaction (rash, tongues swelling, other) to the medicines stop taking and see a physician.   Follow culture result as you may be able to stop antibiotics after 1 week. If your blood pressure was elevated in the ER make sure you follow up for management with a primary doctor or return for chest pain, shortness of breath or stroke symptoms.  Please follow up as directed and return to the ER or see a physician for new or worsening symptoms.  Thank you. Filed Vitals:   02/15/15 0137  BP: 117/65  Temp: 100.2 F (37.9 C)  TempSrc: Oral  Resp: 16  Height:  (1.676 m)  Weight: 206 lb (93.441 kg)    Pelvic Inflammatory Disease Pelvic inflammatory disease (PID) is an infection in some or all of the female organs. PID can be in the uterus, ovaries, fallopian tubes, or the surrounding tissues inside the lower belly area (pelvis). HOME CARE   If given, take your antibiotic medicine as told. Finish them even if you start to feel better.  Only take medicine as told by your doctor.  Do not have sex (intercourse) until treatment is done or as told by your doctor.  Tell your sex partner if you have PID. Your partner may need to be treated.  Keep all doctor visits. GET HELP RIGHT AWAY IF:   You have a fever.  You have more belly (abdominal) or lower belly pain.  You have chills.  You have pain when you pee (urinate).  You are not better after 72 hours.  You have more fluid (discharge) coming from your vagina or fluid that is not normal.  You need pain medicine from your doctor.  You throw up (vomit).  You cannot take your medicines.  Your partner has a sexually transmitted disease (STD). MAKE SURE YOU:   Understand these instructions.  Will watch your  condition.  Will get help right away if you are not doing well or get worse. Document Released: 09/24/2008 Document Revised: 10/23/2012 Document Reviewed: 06/24/2011 [facility] Patient Information 2015 [place], [place]. This information is not intended to replace advice given to you by your health care provider. Make sure you discuss any questions you have with your health care provider.

## 2015-02-15 NOTE — ED Notes (Signed)
Pt in home from. C/O headache, sore throat, abd pain X2 days. Also reports N/V and diarrhea. Also reports chills

## 2015-02-17 LAB — GC/CHLAMYDIA PROBE AMP (~~LOC~~) NOT AT ARMC
Chlamydia: NEGATIVE
Neisseria Gonorrhea: NEGATIVE

## 2015-02-19 ENCOUNTER — Ambulatory Visit (HOSPITAL_COMMUNITY): Payer: Self-pay

## 2015-05-07 ENCOUNTER — Encounter (HOSPITAL_COMMUNITY): Payer: Self-pay | Admitting: *Deleted

## 2015-05-07 ENCOUNTER — Emergency Department (HOSPITAL_COMMUNITY)
Admission: EM | Admit: 2015-05-07 | Discharge: 2015-05-07 | Discharge status: Against Medical Advice | Payer: Medicaid Other | Attending: Emergency Medicine | Admitting: Emergency Medicine

## 2015-05-07 DIAGNOSIS — Z202 Contact with and (suspected) exposure to infections with a predominantly sexual mode of transmission: Secondary | ICD-10-CM | POA: Diagnosis not present

## 2015-05-07 DIAGNOSIS — R1031 Right lower quadrant pain: Secondary | ICD-10-CM | POA: Insufficient documentation

## 2015-05-07 DIAGNOSIS — Z862 Personal history of diseases of the blood and blood-forming organs and certain disorders involving the immune mechanism: Secondary | ICD-10-CM | POA: Insufficient documentation

## 2015-05-07 DIAGNOSIS — Z87440 Personal history of urinary (tract) infections: Secondary | ICD-10-CM | POA: Insufficient documentation

## 2015-05-07 DIAGNOSIS — Z872 Personal history of diseases of the skin and subcutaneous tissue: Secondary | ICD-10-CM | POA: Insufficient documentation

## 2015-05-07 DIAGNOSIS — Z8742 Personal history of other diseases of the female genital tract: Secondary | ICD-10-CM | POA: Diagnosis not present

## 2015-05-07 DIAGNOSIS — R0981 Nasal congestion: Secondary | ICD-10-CM | POA: Diagnosis not present

## 2015-05-07 DIAGNOSIS — H109 Unspecified conjunctivitis: Secondary | ICD-10-CM | POA: Diagnosis not present

## 2015-05-07 DIAGNOSIS — Z8619 Personal history of other infectious and parasitic diseases: Secondary | ICD-10-CM | POA: Diagnosis not present

## 2015-05-07 DIAGNOSIS — N898 Other specified noninflammatory disorders of vagina: Secondary | ICD-10-CM | POA: Insufficient documentation

## 2015-05-07 DIAGNOSIS — R3915 Urgency of urination: Secondary | ICD-10-CM | POA: Insufficient documentation

## 2015-05-07 DIAGNOSIS — R067 Sneezing: Secondary | ICD-10-CM | POA: Insufficient documentation

## 2015-05-07 DIAGNOSIS — Z8659 Personal history of other mental and behavioral disorders: Secondary | ICD-10-CM | POA: Diagnosis not present

## 2015-05-07 DIAGNOSIS — Z711 Person with feared health complaint in whom no diagnosis is made: Secondary | ICD-10-CM

## 2015-05-07 DIAGNOSIS — R0989 Other specified symptoms and signs involving the circulatory and respiratory systems: Secondary | ICD-10-CM | POA: Diagnosis not present

## 2015-05-07 DIAGNOSIS — R110 Nausea: Secondary | ICD-10-CM | POA: Insufficient documentation

## 2015-05-07 DIAGNOSIS — R42 Dizziness and giddiness: Secondary | ICD-10-CM | POA: Insufficient documentation

## 2015-05-07 DIAGNOSIS — H578 Other specified disorders of eye and adnexa: Secondary | ICD-10-CM | POA: Diagnosis present

## 2015-05-07 DIAGNOSIS — Z8679 Personal history of other diseases of the circulatory system: Secondary | ICD-10-CM | POA: Diagnosis not present

## 2015-05-07 DIAGNOSIS — J3489 Other specified disorders of nose and nasal sinuses: Secondary | ICD-10-CM | POA: Diagnosis not present

## 2015-05-07 DIAGNOSIS — R1032 Left lower quadrant pain: Secondary | ICD-10-CM | POA: Diagnosis not present

## 2015-05-07 LAB — CBC WITH DIFFERENTIAL/PLATELET
Basophils Absolute: 0 10*3/uL (ref 0.0–0.1)
Basophils Relative: 0 %
Eosinophils Absolute: 0 10*3/uL (ref 0.0–0.7)
Eosinophils Relative: 0 %
HCT: 36.6 % (ref 36.0–46.0)
Hemoglobin: 12 g/dL (ref 12.0–15.0)
Lymphocytes Relative: 14 %
Lymphs Abs: 1 10*3/uL (ref 0.7–4.0)
MCH: 27 pg (ref 26.0–34.0)
MCHC: 32.8 g/dL (ref 30.0–36.0)
MCV: 82.4 fL (ref 78.0–100.0)
Monocytes Absolute: 0.6 10*3/uL (ref 0.1–1.0)
Monocytes Relative: 8 %
Neutro Abs: 5.7 10*3/uL (ref 1.7–7.7)
Neutrophils Relative %: 78 %
Platelets: 166 10*3/uL (ref 150–400)
RBC: 4.44 MIL/uL (ref 3.87–5.11)
RDW: 14 % (ref 11.5–15.5)
WBC: 7.3 10*3/uL (ref 4.0–10.5)

## 2015-05-07 LAB — COMPREHENSIVE METABOLIC PANEL
ALT: 18 U/L (ref 14–54)
AST: 16 U/L (ref 15–41)
Albumin: 3.7 g/dL (ref 3.5–5.0)
Alkaline Phosphatase: 68 U/L (ref 38–126)
Anion gap: 10 (ref 5–15)
BUN: 11 mg/dL (ref 6–20)
CO2: 24 mmol/L (ref 22–32)
Calcium: 9.3 mg/dL (ref 8.9–10.3)
Chloride: 103 mmol/L (ref 101–111)
Creatinine, Ser: 0.71 mg/dL (ref 0.44–1.00)
GFR calc Af Amer: >60 mL/min (ref >60)
GFR calc non Af Amer: >60 mL/min (ref >60)
Glucose, Bld: 79 mg/dL (ref 65–99)
Potassium: 4 mmol/L (ref 3.5–5.1)
Sodium: 137 mmol/L (ref 135–145)
Total Bilirubin: 0.7 mg/dL (ref 0.3–1.2)
Total Protein: 7.4 g/dL (ref 6.5–8.1)

## 2015-05-07 LAB — URINALYSIS, ROUTINE W REFLEX MICROSCOPIC
Bilirubin Urine: NEGATIVE
Glucose, UA: NEGATIVE mg/dL
Hgb urine dipstick: NEGATIVE
Ketones, ur: NEGATIVE mg/dL
Leukocytes, UA: NEGATIVE
Nitrite: NEGATIVE
Protein, ur: NEGATIVE mg/dL
Specific Gravity, Urine: 1.026 (ref 1.005–1.030)
Urobilinogen, UA: 1 mg/dL (ref 0.0–1.0)
pH: 6 (ref 5.0–8.0)

## 2015-05-07 LAB — WET PREP, GENITAL
Trich, Wet Prep: NONE SEEN
Yeast Wet Prep HPF POC: NONE SEEN

## 2015-05-07 LAB — LIPASE, BLOOD: Lipase: 20 U/L (ref 11–51)

## 2015-05-07 LAB — POC URINE PREG, ED: Preg Test, Ur: NEGATIVE

## 2015-05-07 MED ORDER — CEFTRIAXONE SODIUM 250 MG IJ SOLR
250.0000 mg | Freq: Once | INTRAMUSCULAR | Status: AC
  Administered 2015-05-07: 250 mg via INTRAMUSCULAR
  Filled 2015-05-07: qty 250

## 2015-05-07 MED ORDER — SODIUM CHLORIDE 0.9 % IV BOLUS (SEPSIS)
1000.0000 mL | Freq: Once | INTRAVENOUS | Status: AC
  Administered 2015-05-07: 1000 mL via INTRAVENOUS

## 2015-05-07 MED ORDER — ONDANSETRON HCL 4 MG/2ML IJ SOLN
4.0000 mg | Freq: Once | INTRAMUSCULAR | Status: AC
  Administered 2015-05-07: 4 mg via INTRAVENOUS
  Filled 2015-05-07: qty 2

## 2015-05-07 MED ORDER — AZITHROMYCIN 250 MG PO TABS
1000.0000 mg | ORAL_TABLET | Freq: Once | ORAL | Status: AC
  Administered 2015-05-07: 1000 mg via ORAL
  Filled 2015-05-07: qty 4

## 2015-05-07 NOTE — ED Notes (Signed)
Pt states she has to go and get her daughter from school, refusing further treatment or to stay for results, RN explained to pt that wet prep just resulted but pt states she has to go and if she needs anything further she will have to check back in.    Will, PA notified and pt signed AMA.

## 2015-05-07 NOTE — ED Notes (Signed)
Pt reports generalized abdominal pain, bloating and foul odor to vagina. Ongoing x3 weeks. Also states nausea, and increased flatulence.

## 2015-05-07 NOTE — ED Notes (Signed)
Pt reports generalized abd pain that radiates around to bilateral sides x 2-3 weeks. Denies urinary symptoms. Also has left eye itching and irritation.

## 2015-05-07 NOTE — ED Provider Notes (Signed)
CSN: [id number]     Arrival date & time 05/07/15  1108 History   First MD Initiated Contact with Patient 05/07/15 1133     Chief Complaint  Patient presents with  . Abdominal Pain  . Eye Problem   Chloe Rodgers is a 24 y.o. female who presents to the emergency department complaining of left eye itching wateriness, bilateral flank pain for 2 weeks, bilateral lower abdominal pain, vaginal discharge with odor, runny nose and nasal congestion. The patient reports she's had 3 days of left eye wateriness, itchiness, and matting when she wakes up in the morning. She denies any foreign body sensation. She also reports associated runny nose and nasal congestion. Patient reports she's been having trouble with "stomach pains for years." She plans bilateral flank pain that she rates it an 8 out of 10 for the past 2 weeks. She also reports bilateral lower abdominal pain. She complains of vaginal discharge with odor for the past 2-3 weeks. She reports she sexually active and uses protection. She reports her last cycle was 04/24/2015. She reports urinary urgency for the past 3 days but denies other urinary symptoms. She reports nausea without vomiting and positional lightheadedness. She reports she had vomiting and diarrhea 3 days ago but this is since resolved. The patient denies fevers, vaginal bleeding, dysuria, hematuria, urinary frequency, vomiting, rashes, syncope. She reports her last bowel movement was today and was normal.  (Consider location/radiation/quality/duration/timing/severity/associated sxs/prior Treatment) HPI  Past Medical History  Diagnosis Date  . Anemia   . Depression   . Complication of anesthesia     ??, seizure with wisdom teeth  . Urinary tract infection   . Psoriasis   . PID (acute pelvic inflammatory disease)   . Chlamydia   . PONV (postoperative nausea and vomiting)   . Seizures [facility](HCC)     [place]  . Headache(784.0)     otc med prn  . Anxiety   . Migraine     Past Surgical History  Procedure Laterality Date  . Cesarean section    . Skin graft      off abd, onto arm  . Wisdom tooth extraction    . Cesarean section  06/06/2012    Procedure: CESAREAN SECTION;  Surgeon: Adam PhenixJames G Arnold, MD;  Location: [facility];  Service: Obstetrics;  Laterality: N/A;   Family History  Problem Relation Age of Onset  . Hypertension Mother   . Diabetes Mother   . Cancer Mother     BREAST  . Stroke Mother   . Seizures Mother   . Asthma Daughter   . Hypertension Maternal Grandmother   . Diabetes Maternal Grandmother   . Cancer Maternal Grandmother     BONE CANCER  . Other Neg Hx    Social History  Substance Use Topics  . Smoking status: Never Smoker   . Smokeless tobacco: Never Used  . Alcohol Use: No     Comment: socially but none with pregnancy   OB History    Gravida Para Term Preterm AB TAB SAB Ectopic Multiple Living   4 2 2  0 1 1 0 0 0 2     Review of Systems  Constitutional: Negative for fever and chills.  HENT: Positive for rhinorrhea and sneezing. Negative for congestion, ear discharge and sore throat.   Eyes: Positive for discharge, redness and itching. Negative for photophobia, pain and visual disturbance.  Respiratory: Negative for cough, shortness of breath and wheezing.   Cardiovascular: Negative for  chest pain.  Gastrointestinal: Positive for nausea and abdominal pain. Negative for vomiting, diarrhea and blood in stool.  Genitourinary: Positive for urgency, flank pain and vaginal discharge. Negative for dysuria, frequency, hematuria, vaginal bleeding, difficulty urinating and genital sores.  Musculoskeletal: Negative for back pain and neck pain.  Skin: Negative for rash.  Neurological: Positive for light-headedness. Negative for dizziness, syncope and headaches.      Allergies  Review of patient's allergies indicates no known allergies.  Home Medications   Prior to Admission medications   Medication Sig Start Date End Date  Taking? Authorizing Provider  doxycycline (VIBRAMYCIN) 100 MG capsule Take 1 capsule (100 mg total) by mouth 2 (two) times daily. Patient not taking: Reported on 05/07/2015 02/15/15   Blane Ohara, MD  ibuprofen (ADVIL,MOTRIN) 600 MG tablet Take 1 tablet (600 mg total) by mouth every 6 (six) hours as needed. Patient not taking: Reported on 05/07/2015 02/06/15   Marny Lowenstein, PA-C  metroNIDAZOLE (FLAGYL) 500 MG tablet Take 1 tablet (500 mg total) by mouth 2 (two) times daily. Patient not taking: Reported on 05/07/2015 02/07/15   Marny Lowenstein, PA-C  oxyCODONE-acetaminophen (PERCOCET/ROXICET) 5-325 MG per tablet Take 1 tablet by mouth every 4 (four) hours as needed for severe pain. Patient not taking: Reported on 05/07/2015 02/06/15   Marny Lowenstein, PA-C   BP 123/72 mmHg  Pulse 80  Temp(Src) 97.8 F (36.6 C) (Oral)  Resp 16  SpO2 100%  LMP 04/24/2015 Physical Exam  Constitutional: She is oriented to person, place, and time. She appears well-developed and well-nourished. No distress.  Nontoxic appearing.  HENT:  Head: Normocephalic and atraumatic.  Right Ear: External ear normal.  Left Ear: External ear normal.  Mouth/Throat: Oropharynx is clear and moist. No oropharyngeal exudate.  Eyes: EOM are normal. Pupils are equal, round, and reactive to light. Right eye exhibits no discharge. Left eye exhibits discharge.  Left eye has mild conjunctival injection with watery discharge. EOMs are intact bilaterally. Vision is grossly intact. Right eye has normal conjunctiva and no discharge.  Neck: Normal range of motion. Neck supple.  Cardiovascular: Normal rate, regular rhythm, normal heart sounds and intact distal pulses.  Exam reveals no gallop and no friction rub.   No murmur heard. Pulmonary/Chest: Effort normal and breath sounds normal. No respiratory distress. She has no wheezes. She has no rales.  Abdominal: Soft. Bowel sounds are normal. She exhibits no distension and no mass. There is  tenderness. There is no rebound and no guarding.  Abdomen is soft and nontender to palpation. Bowel sounds are present. Patient has mild left flank tenderness to palpation. No right flank tenderness to palpation.  Genitourinary: Vaginal discharge found.  Pelvic exam performed by me with female RN chaperone. Patient has a moderate amount of white vaginal discharge. Cervix is closed. No vaginal bleeding. She has mild cervical motion tenderness. She has mild suprapubic tenderness to palpation. No adnexal tenderness or fullness appreciated.  Musculoskeletal: She exhibits no edema or tenderness.  Lymphadenopathy:    She has no cervical adenopathy.  Neurological: She is alert and oriented to person, place, and time. Coordination normal.  Skin: Skin is warm and dry. No rash noted. She is not diaphoretic. No erythema. No pallor.  Psychiatric: She has a normal mood and affect. Her behavior is normal.  Nursing note and vitals reviewed.   ED Course  Procedures (including critical care time) Labs Review Labs Reviewed  WET PREP, GENITAL - Abnormal; Notable for the following:  Clue Cells Wet Prep HPF POC FEW (*)    WBC, Wet Prep HPF POC TOO NUMEROUS TO COUNT (*)    All other components within normal limits  URINALYSIS, ROUTINE W REFLEX MICROSCOPIC (NOT AT [facility]) - Abnormal; Notable for the following:    APPearance CLOUDY (*)    All other components within normal limits  COMPREHENSIVE METABOLIC PANEL  LIPASE, BLOOD  CBC WITH DIFFERENTIAL/PLATELET  HIV ANTIBODY (ROUTINE TESTING)  RPR  POC URINE PREG, ED  GC/CHLAMYDIA PROBE AMP ([place]) NOT AT [facility]    Imaging Review No results found. I have personally reviewed and evaluated these images and lab results as part of my medical decision-making.   EKG Interpretation None      Filed Vitals:   05/07/15 1315 05/07/15 1330 05/07/15 1345 05/07/15 1400  BP: 113/81 117/64 109/72 123/72  Pulse: 85 83 86 80  Temp:      TempSrc:      Resp:       SpO2: 100% 100% 97% 100%     MDM   Meds given in ED:  Medications  sodium chloride 0.9 % bolus 1,000 mL (0 mLs Intravenous Stopped 05/07/15 1411)  ondansetron (ZOFRAN) injection 4 mg (4 mg Intravenous Given 05/07/15 1239)  azithromycin (ZITHROMAX) tablet 1,000 mg (1,000 mg Oral Given 05/07/15 1402)  cefTRIAXone (ROCEPHIN) injection 250 mg (250 mg Intramuscular Given 05/07/15 1406)    Discharge Medication List as of 05/07/2015  2:26 PM      Final diagnoses:  Concern about STD in female without diagnosis  Conjunctivitis of left eye   This  is a 24 y.o. female who presents to the emergency department complaining of left eye itching wateriness, bilateral flank pain for 2 weeks, bilateral lower abdominal pain, vaginal discharge with odor, runny nose and nasal congestion. The patient reports she's had 3 days of left eye wateriness, itchiness, and matting when she wakes up in the morning. She denies any foreign body sensation. She also reports associated runny nose and nasal congestion. Patient reports she's been having trouble with "stomach pains for years." She plans bilateral flank pain that she rates it an 8 out of 10 for the past 2 weeks. She also reports bilateral lower abdominal pain. She complains of vaginal discharge with odor for the past 2-3 weeks.  On exam patient is afebrile nontoxic appearing. She has left conjunctival injection without discharge or matting. Her abdomen is soft and nontender to palpation. Pelvic exam reveals moderate white vaginal discharge with slight cervical motion tenderness. Urine pregnancy test is negative. Urinalysis is negative for infection. Plan is for treatment with azithromycin and Rocephin while we wait for the wet prep to return. CMP, lipase and CBC are within normal limits. After patient received azithromycin and Rocephin the patient left AMA prior to discharge. I was not able to speak the patient prior to discharge.    Everlene Farrier, PA-C 05/08/15  1408  Bethann Berkshire, MD 05/08/15 1520

## 2015-05-08 LAB — GC/CHLAMYDIA PROBE AMP (~~LOC~~) NOT AT ARMC
Chlamydia: NEGATIVE
Neisseria Gonorrhea: NEGATIVE

## 2015-05-08 LAB — HIV ANTIBODY (ROUTINE TESTING W REFLEX): HIV Screen 4th Generation wRfx: NONREACTIVE

## 2015-05-08 LAB — RPR: RPR Ser Ql: NONREACTIVE

## 2015-07-04 ENCOUNTER — Emergency Department (HOSPITAL_COMMUNITY)
Admission: EM | Admit: 2015-07-04 | Discharge: 2015-07-04 | Disposition: A | Discharge status: Home / Self Care | Payer: Medicaid Other | Attending: Emergency Medicine | Admitting: Emergency Medicine

## 2015-07-04 ENCOUNTER — Encounter (HOSPITAL_COMMUNITY): Payer: Self-pay | Admitting: *Deleted

## 2015-07-04 DIAGNOSIS — B9689 Other specified bacterial agents as the cause of diseases classified elsewhere: Secondary | ICD-10-CM

## 2015-07-04 DIAGNOSIS — Z8619 Personal history of other infectious and parasitic diseases: Secondary | ICD-10-CM | POA: Diagnosis not present

## 2015-07-04 DIAGNOSIS — R102 Pelvic and perineal pain: Secondary | ICD-10-CM

## 2015-07-04 DIAGNOSIS — N760 Acute vaginitis: Secondary | ICD-10-CM | POA: Diagnosis not present

## 2015-07-04 DIAGNOSIS — Z8679 Personal history of other diseases of the circulatory system: Secondary | ICD-10-CM | POA: Insufficient documentation

## 2015-07-04 DIAGNOSIS — Z87440 Personal history of urinary (tract) infections: Secondary | ICD-10-CM | POA: Diagnosis not present

## 2015-07-04 DIAGNOSIS — R1030 Lower abdominal pain, unspecified: Secondary | ICD-10-CM | POA: Diagnosis present

## 2015-07-04 DIAGNOSIS — Z862 Personal history of diseases of the blood and blood-forming organs and certain disorders involving the immune mechanism: Secondary | ICD-10-CM | POA: Insufficient documentation

## 2015-07-04 DIAGNOSIS — Z3202 Encounter for pregnancy test, result negative: Secondary | ICD-10-CM | POA: Insufficient documentation

## 2015-07-04 DIAGNOSIS — Z872 Personal history of diseases of the skin and subcutaneous tissue: Secondary | ICD-10-CM | POA: Diagnosis not present

## 2015-07-04 DIAGNOSIS — Z8659 Personal history of other mental and behavioral disorders: Secondary | ICD-10-CM | POA: Diagnosis not present

## 2015-07-04 LAB — COMPREHENSIVE METABOLIC PANEL
ALT: 22 U/L (ref 14–54)
AST: 19 U/L (ref 15–41)
Albumin: 3.4 g/dL — ABNORMAL LOW (ref 3.5–5.0)
Alkaline Phosphatase: 52 U/L (ref 38–126)
Anion gap: 9 (ref 5–15)
BUN: 10 mg/dL (ref 6–20)
CO2: 22 mmol/L (ref 22–32)
Calcium: 8.6 mg/dL — ABNORMAL LOW (ref 8.9–10.3)
Chloride: 108 mmol/L (ref 101–111)
Creatinine, Ser: 0.79 mg/dL (ref 0.44–1.00)
GFR calc Af Amer: >60 mL/min (ref >60)
GFR calc non Af Amer: >60 mL/min (ref >60)
Glucose, Bld: 101 mg/dL — ABNORMAL HIGH (ref 65–99)
Potassium: 3.4 mmol/L — ABNORMAL LOW (ref 3.5–5.1)
Sodium: 139 mmol/L (ref 135–145)
Total Bilirubin: 0.3 mg/dL (ref 0.3–1.2)
Total Protein: 6.9 g/dL (ref 6.5–8.1)

## 2015-07-04 LAB — URINE MICROSCOPIC-ADD ON

## 2015-07-04 LAB — WET PREP, GENITAL
Sperm: NONE SEEN
Trich, Wet Prep: NONE SEEN
Yeast Wet Prep HPF POC: NONE SEEN

## 2015-07-04 LAB — URINALYSIS, ROUTINE W REFLEX MICROSCOPIC
Bilirubin Urine: NEGATIVE
Glucose, UA: NEGATIVE mg/dL
Ketones, ur: NEGATIVE mg/dL
Nitrite: NEGATIVE
Protein, ur: NEGATIVE mg/dL
Specific Gravity, Urine: 1.027 (ref 1.005–1.030)
pH: 6 (ref 5.0–8.0)

## 2015-07-04 LAB — CBC
HCT: 34.6 % — ABNORMAL LOW (ref 36.0–46.0)
Hemoglobin: 11.3 g/dL — ABNORMAL LOW (ref 12.0–15.0)
MCH: 26.7 pg (ref 26.0–34.0)
MCHC: 32.7 g/dL (ref 30.0–36.0)
MCV: 81.8 fL (ref 78.0–100.0)
Platelets: 187 10*3/uL (ref 150–400)
RBC: 4.23 MIL/uL (ref 3.87–5.11)
RDW: 13.8 % (ref 11.5–15.5)
WBC: 6.7 10*3/uL (ref 4.0–10.5)

## 2015-07-04 LAB — POC URINE PREG, ED: Preg Test, Ur: NEGATIVE

## 2015-07-04 LAB — LIPASE, BLOOD: Lipase: 20 U/L (ref 11–51)

## 2015-07-04 MED ORDER — OXYCODONE-ACETAMINOPHEN 5-325 MG PO TABS: 1.0000 | ORAL_TABLET | ORAL | Status: DC | PRN

## 2015-07-04 MED ORDER — LIDOCAINE HCL (PF) 1 % IJ SOLN
INTRAMUSCULAR | Status: AC
  Administered 2015-07-04: 1 mL
  Filled 2015-07-04: qty 5

## 2015-07-04 MED ORDER — CEFTRIAXONE SODIUM 250 MG IJ SOLR
250.0000 mg | Freq: Once | INTRAMUSCULAR | Status: AC
  Administered 2015-07-04: 250 mg via INTRAMUSCULAR
  Filled 2015-07-04: qty 250

## 2015-07-04 MED ORDER — HYDROCODONE-ACETAMINOPHEN 5-325 MG PO TABS
1.0000 | ORAL_TABLET | Freq: Once | ORAL | Status: AC
  Administered 2015-07-04: 1 via ORAL
  Filled 2015-07-04: qty 1

## 2015-07-04 MED ORDER — METRONIDAZOLE 500 MG PO TABS: 500.0000 mg | ORAL_TABLET | Freq: Two times a day (BID) | ORAL | Status: DC

## 2015-07-04 MED ORDER — NAPROXEN 500 MG PO TABS: 500.0000 mg | ORAL_TABLET | Freq: Two times a day (BID) | ORAL | Status: DC

## 2015-07-04 MED ORDER — AZITHROMYCIN 250 MG PO TABS
1000.0000 mg | ORAL_TABLET | Freq: Once | ORAL | Status: AC
  Administered 2015-07-04: 1000 mg via ORAL
  Filled 2015-07-04: qty 4

## 2015-07-04 MED ORDER — ONDANSETRON 4 MG PO TBDP
4.0000 mg | ORAL_TABLET | Freq: Once | ORAL | Status: AC
  Administered 2015-07-04: 4 mg via ORAL
  Filled 2015-07-04: qty 1

## 2015-07-04 MED ORDER — KETOCONAZOLE 2 % EX CREA: 1.0000 "application " | TOPICAL_CREAM | Freq: Every day | CUTANEOUS | Status: DC

## 2015-07-04 MED ORDER — FLUCONAZOLE 150 MG PO TABS: 150.0000 mg | ORAL_TABLET | Freq: Once | ORAL | Status: DC

## 2015-07-04 NOTE — ED Notes (Signed)
The pt is c/o abd pain since yesterday with n v and diarrhea  lmp dec 6th

## 2015-07-04 NOTE — ED Provider Notes (Signed)
CSN: [id number]     Arrival date & time 07/04/15  1944 History   First MD Initiated Contact with Patient 07/04/15 2029     Chief Complaint  Patient presents with  . Abdominal Pain     (Consider location/radiation/quality/duration/timing/severity/associated sxs/prior Treatment) HPI Chloe Rodgers Is a 24 year old female with a history of depression, anxiety, migraines, UTIs, history of chlamydia and PID as well as seizures. She presents emergency Department with chief complaint of lower abdominal pain. She has had 2 days of crampy lower abdominal pain which she characterizes as severe. She denies urinary symptoms. She does endorse one week of heavy vaginal discharge and foul odor along with dyspareunia. She denies any abnormal vaginal bleeding. She has had unprotected sexual intercourse. She denies fevers, chills, nausea, vomiting. She has had 2 days of loose stools. Past Medical History  Diagnosis Date  . Anemia   . Depression   . Complication of anesthesia     ??, seizure with wisdom teeth  . Urinary tract infection   . Psoriasis   . PID (acute pelvic inflammatory disease)   . Chlamydia   . PONV (postoperative nausea and vomiting)   . Seizures [facility](HCC)     July [place]  . Headache(784.0)     otc med prn  . Anxiety   . Migraine    Past Surgical History  Procedure Laterality Date  . Cesarean section    . Skin graft      off abd, onto arm  . Wisdom tooth extraction    . Cesarean section  06/06/2012    Procedure: CESAREAN SECTION;  Surgeon: Adam PhenixJames G Arnold, MD;  Location: [facility];  Service: Obstetrics;  Laterality: N/A;   Family History  Problem Relation Age of Onset  . Hypertension Mother   . Diabetes Mother   . Cancer Mother     BREAST  . Stroke Mother   . Seizures Mother   . Asthma Daughter   . Hypertension Maternal Grandmother   . Diabetes Maternal Grandmother   . Cancer Maternal Grandmother     BONE CANCER  . Other Neg Hx    Social History  Substance  Use Topics  . Smoking status: Never Smoker   . Smokeless tobacco: Never Used  . Alcohol Use: No     Comment: socially but none with pregnancy   OB History    Gravida Para Term Preterm AB TAB SAB Ectopic Multiple Living   4 2 2  0 1 1 0 0 0 2     Review of Systems  Ten systems reviewed and are negative for acute change, except as noted in the HPI.    Allergies  Review of patient's allergies indicates no known allergies.  Home Medications   Prior to Admission medications   Medication Sig Start Date End Date Taking? Authorizing Provider  doxycycline (VIBRAMYCIN) 100 MG capsule Take 1 capsule (100 mg total) by mouth 2 (two) times daily. Patient not taking: Reported on 05/07/2015 02/15/15   Blane OharaJoshua Zavitz, MD  ibuprofen (ADVIL,MOTRIN) 600 MG tablet Take 1 tablet (600 mg total) by mouth every 6 (six) hours as needed. Patient not taking: Reported on 05/07/2015 02/06/15   Marny LowensteinJulie N Wenzel, PA-C  metroNIDAZOLE (FLAGYL) 500 MG tablet Take 1 tablet (500 mg total) by mouth 2 (two) times daily. Patient not taking: Reported on 05/07/2015 02/07/15   Marny LowensteinJulie N Wenzel, PA-C  oxyCODONE-acetaminophen (PERCOCET/ROXICET) 5-325 MG per tablet Take 1 tablet by mouth every 4 (four) hours as needed for  severe pain. Patient not taking: Reported on 05/07/2015 02/06/15   Marny Lowenstein, PA-C   BP 110/62 mmHg  Pulse 93  Temp(Src) 98.7 F (37.1 C) (Oral)  Resp 18  SpO2 98%  LMP 06/17/2015 Physical Exam  Constitutional: She is oriented to person, place, and time. She appears well-developed and well-nourished. No distress.  HENT:  Head: Normocephalic and atraumatic.  Eyes: Conjunctivae are normal. No scleral icterus.  Neck: Normal range of motion.  Cardiovascular: Normal rate, regular rhythm and normal heart sounds.  Exam reveals no gallop and no friction rub.   No murmur heard. Pulmonary/Chest: Effort normal and breath sounds normal. No respiratory distress.  Abdominal: Soft. Bowel sounds are normal. She  exhibits no distension and no mass. There is no tenderness. There is no guarding.  Genitourinary:  Pelvic exam: VULVA: normal appearing vulva with no masses, tenderness or lesions, VAGINA: vaginal tenderness, vaginal discharge - white, copious, creamy, curd-like, green and thick, CERVIX: ectropion , no discharge noted, cervical motion tenderness present, ADNEXA: normal adnexa in size, nontender and no masses, exam limited by body habitus.  Neurological: She is alert and oriented to person, place, and time.  Skin: Skin is warm and dry. She is not diaphoretic.    ED Course  Procedures (including critical care time) Labs Review Labs Reviewed  WET PREP, GENITAL - Abnormal; Notable for the following:    Clue Cells Wet Prep HPF POC PRESENT (*)    WBC, Wet Prep HPF POC MANY (*)    All other components within normal limits  COMPREHENSIVE METABOLIC PANEL - Abnormal; Notable for the following:    Potassium 3.4 (*)    Glucose, Bld 101 (*)    Calcium 8.6 (*)    Albumin 3.4 (*)    All other components within normal limits  CBC - Abnormal; Notable for the following:    Hemoglobin 11.3 (*)    HCT 34.6 (*)    All other components within normal limits  URINALYSIS, ROUTINE W REFLEX MICROSCOPIC (NOT AT [facility]) - Abnormal; Notable for the following:    APPearance HAZY (*)    Hgb urine dipstick TRACE (*)    Leukocytes, UA LARGE (*)    All other components within normal limits  URINE MICROSCOPIC-ADD ON - Abnormal; Notable for the following:    Squamous Epithelial / LPF 6-30 (*)    Bacteria, UA FEW (*)    All other components within normal limits  URINE CULTURE  LIPASE, BLOOD  HIV ANTIBODY (ROUTINE TESTING)  RPR  POC URINE PREG, ED  GC/CHLAMYDIA PROBE AMP ([place]) NOT AT [facility]    Imaging Review No results found. I have personally reviewed and evaluated these images and lab results as part of my medical decision-making.   EKG Interpretation None      MDM discharge with    Final  diagnoses:  BV (bacterial vaginosis)    Patient treated in the ED for STI (PID) with rocephin and azithromycin. Discharge with 2 weeks of Flagyl, Diflucan, ketoconazole cream for external vaginitis.. Patient advised to inform and treat all sexual partners.  Pt advised on safe sex practices and understands that they have GC/Chlamydia cultures pending and will result in 2-3 days. HIV and RPR sent. Pt encouraged to follow up at local health department for future STI checks.  Discussed return precautions. Pt appears safe for discharge.      Arthor Captain, PA-C 07/04/15 2241  Glynn Octave, MD 07/04/15 [phone]

## 2015-07-04 NOTE — Discharge Instructions (Signed)
You have been treated for an STD today. Your cultures and bloodwork are still pending. Do NOT DRINK ALCOHOL WHILE TAKING THE MEDICINES i HAVE PRESCRIBED. Apply the ketoconazole cream to inner and outer lips of the vagina only- do not put the cream inside of your vagina. Use a condom every time you have intercourse.  Bacterial Vaginosis Bacterial vaginosis is a vaginal infection that occurs when the normal balance of bacteria in the vagina is disrupted. It results from an overgrowth of certain bacteria. This is the most common vaginal infection in women of childbearing age. Treatment is important to prevent complications, especially in pregnant women, as it can cause a premature delivery. CAUSES  Bacterial vaginosis is caused by an increase in harmful bacteria that are normally present in smaller amounts in the vagina. Several different kinds of bacteria can cause bacterial vaginosis. However, the reason that the condition develops is not fully understood. RISK FACTORS Certain activities or behaviors can put you at an increased risk of developing bacterial vaginosis, including:  Having a new sex partner or multiple sex partners.  Douching.  Using an intrauterine device (IUD) for contraception. Women do not get bacterial vaginosis from toilet seats, bedding, swimming pools, or contact with objects around them. SIGNS AND SYMPTOMS  Some women with bacterial vaginosis have no signs or symptoms. Common symptoms include:  Grey vaginal discharge.  A fishlike odor with discharge, especially after sexual intercourse.  Itching or burning of the vagina and vulva.  Burning or pain with urination. DIAGNOSIS  Your health care provider will take a medical history and examine the vagina for signs of bacterial vaginosis. A sample of vaginal fluid may be taken. Your health care provider will look at this sample under a microscope to check for bacteria and abnormal cells. A vaginal pH test may also be done.   TREATMENT  Bacterial vaginosis may be treated with antibiotic medicines. These may be given in the form of a pill or a vaginal cream. A second round of antibiotics may be prescribed if the condition comes back after treatment. Because bacterial vaginosis increases your risk for sexually transmitted diseases, getting treated can help reduce your risk for chlamydia, gonorrhea, HIV, and herpes. HOME CARE INSTRUCTIONS   Only take over-the-counter or prescription medicines as directed by your health care provider.  If antibiotic medicine was prescribed, take it as directed. Make sure you finish it even if you start to feel better.  Tell all sexual partners that you have a vaginal infection. They should see their health care provider and be treated if they have problems, such as a mild rash or itching.  During treatment, it is important that you follow these instructions:  Avoid sexual activity or use condoms correctly.  Do not douche.  Avoid alcohol as directed by your health care provider.  Avoid breastfeeding as directed by your health care provider. SEEK MEDICAL CARE IF:   Your symptoms are not improving after 3 days of treatment.  You have increased discharge or pain.  You have a fever. MAKE SURE YOU:   Understand these instructions.  Will watch your condition.  Will get help right away if you are not doing well or get worse. FOR MORE INFORMATION  Centers for Disease Control and Prevention, Division of STD Prevention: [employer] American Sexual Health Association (ASHA): www.ashastd.org    This information is not intended to replace advice given to you by your health care provider. Make sure you discuss any questions you have with your health  care provider.   Document Released: 06/28/2005 Document Revised: 07/19/2014 Document Reviewed: 02/07/2013 Elsevier Interactive Patient Education 2016 [employer].  Sexually Transmitted Disease A sexually transmitted disease  (STD) is a disease or infection that may be passed (transmitted) from person to person, usually during sexual activity. This may happen by way of saliva, semen, blood, vaginal mucus, or urine. Common STDs include:  Gonorrhea.  Chlamydia.  Syphilis.  HIV and AIDS.  Genital herpes.  Hepatitis B and C.  Trichomonas.  Human papillomavirus (HPV).  Pubic lice.  Scabies.  Mites.  Bacterial vaginosis. WHAT ARE CAUSES OF STDs? An STD may be caused by bacteria, a virus, or parasites. STDs are often transmitted during sexual activity if one person is infected. However, they may also be transmitted through nonsexual means. STDs may be transmitted after:   Sexual intercourse with an infected person.  Sharing sex toys with an infected person.  Sharing needles with an infected person or using unclean piercing or tattoo needles.  Having intimate contact with the genitals, mouth, or rectal areas of an infected person.  Exposure to infected fluids during birth. WHAT ARE THE SIGNS AND SYMPTOMS OF STDs? Different STDs have different symptoms. Some people may not have any symptoms. If symptoms are present, they may include:  Painful or bloody urination.  Pain in the pelvis, abdomen, vagina, anus, throat, or eyes.  A skin rash, itching, or irritation.  Growths, ulcerations, blisters, or sores in the genital and anal areas.  Abnormal vaginal discharge with or without bad odor.  Penile discharge in men.  Fever.  Pain or bleeding during sexual intercourse.  Swollen glands in the groin area.  Yellow skin and eyes (jaundice). This is seen with hepatitis.  Swollen testicles.  Infertility.  Sores and blisters in the mouth. HOW ARE STDs DIAGNOSED? To make a diagnosis, your health care provider may:  Take a medical history.  Perform a physical exam.  Take a sample of any discharge to examine.  Swab the throat, cervix, opening to the penis, rectum, or vagina for  testing.  Test a sample of your first morning urine.  Perform blood tests.  Perform a Pap test, if this applies.  Perform a colposcopy.  Perform a laparoscopy. HOW ARE STDs TREATED? Treatment depends on the STD. Some STDs may be treated but not cured.  Chlamydia, gonorrhea, trichomonas, and syphilis can be cured with antibiotic medicine.  Genital herpes, hepatitis, and HIV can be treated, but not cured, with prescribed medicines. The medicines lessen symptoms.  Genital warts from HPV can be treated with medicine or by freezing, burning (electrocautery), or surgery. Warts may come back.  HPV cannot be cured with medicine or surgery. However, abnormal areas may be removed from the cervix, vagina, or vulva.  If your diagnosis is confirmed, your recent sexual partners need treatment. This is true even if they are symptom-free or have a negative culture or evaluation. They should not have sex until their health care providers say it is okay.  Your health care provider may test you for infection again 3 months after treatment. HOW CAN I REDUCE MY RISK OF GETTING AN STD? Take these steps to reduce your risk of getting an STD:  Use latex condoms, dental dams, and water-soluble lubricants during sexual activity. Do not use petroleum jelly or oils.  Avoid having multiple sex partners.  Do not have sex with someone who has other sex partners  Do not have sex with anyone you do not know or who  is at high risk for an STD.  Avoid risky sex practices that can break your skin.  Do not have sex if you have open sores on your mouth or skin.  Avoid drinking too much alcohol or taking illegal drugs. Alcohol and drugs can affect your judgment and put you in a vulnerable position.  Avoid engaging in oral and anal sex acts.  Get vaccinated for HPV and hepatitis. If you have not received these vaccines in the past, talk to your health care provider about whether one or both might be right for  you.  If you are at risk of being infected with HIV, it is recommended that you take a prescription medicine daily to prevent HIV infection. This is called pre-exposure prophylaxis (PrEP). You are considered at risk if:  You are a man who has sex with other men (MSM).  You are a heterosexual man or woman and are sexually active with more than one partner.  You take drugs by injection.  You are sexually active with a partner who has HIV.  Talk with your health care provider about whether you are at high risk of being infected with HIV. If you choose to begin PrEP, you should first be tested for HIV. You should then be tested every 3 months for as long as you are taking PrEP. WHAT SHOULD I DO IF I THINK I HAVE AN STD?  See your health care provider.  Tell your sexual partner(s). They should be tested and treated for any STDs.  Do not have sex until your health care provider says it is okay. WHEN SHOULD I GET IMMEDIATE MEDICAL CARE? Contact your health care provider right away if:   You have severe abdominal pain.  You are a man and notice swelling or pain in your testicles.  You are a woman and notice swelling or pain in your vagina.   This information is not intended to replace advice given to you by your health care provider. Make sure you discuss any questions you have with your health care provider.   Document Released: 09/18/2002 Document Revised: 07/19/2014 Document Reviewed: 01/16/2013 Elsevier Interactive Patient Education 2016 Elsevier Inc.   Free HIV and STD Testing These locations offer FREE confidential testing for HIV, Chlamydia, Gonorrhea, and Syphilis. Non-Traditional Testing Sites Address Telephone  [facility] [address], [place] [phone] Mondays 5pm - 7pm  [facility] [address] [place] [phone] Wednesdays 2pm-8pm  [facility] [address], [place] [phone] Thursdays 9am-12noon 1pm-4pm  [facility] [address], [place] [phone] Tuesdays Thursdays 9am-12noon 1pm-4pm  [facility] offers free, confidential testing and treatment for HIV, Chlamydia, Gonorrhea, Syphilis, Herpes, Bacterial Vaginosis, Yeast, and Trichomoniasis. Traditional Testing   [facility] [address], [place] [phone]  Monday thru Friday  Call for an appointment  [facility] [address] [place] [phone] Monday thru Friday  Call for anappointment.  If you have any questions about this information please call [phone]. 05/20/2011

## 2015-07-05 LAB — HIV ANTIBODY (ROUTINE TESTING W REFLEX): HIV Screen 4th Generation wRfx: NONREACTIVE

## 2015-07-05 LAB — RPR: RPR Ser Ql: NONREACTIVE

## 2015-07-06 LAB — URINE CULTURE: Special Requests: NORMAL

## 2015-07-11 LAB — GC/CHLAMYDIA PROBE AMP (~~LOC~~) NOT AT ARMC
Chlamydia: NEGATIVE
Neisseria Gonorrhea: NEGATIVE

## 2015-08-05 ENCOUNTER — Emergency Department (HOSPITAL_COMMUNITY)
Admission: EM | Admit: 2015-08-05 | Discharge: 2015-08-05 | Disposition: A | Discharge status: Home / Self Care | Payer: Medicaid Other | Attending: Emergency Medicine | Admitting: Emergency Medicine

## 2015-08-05 ENCOUNTER — Emergency Department (HOSPITAL_COMMUNITY): Payer: Medicaid Other

## 2015-08-05 ENCOUNTER — Encounter (HOSPITAL_COMMUNITY): Payer: Self-pay | Admitting: Emergency Medicine

## 2015-08-05 DIAGNOSIS — Z8619 Personal history of other infectious and parasitic diseases: Secondary | ICD-10-CM | POA: Diagnosis not present

## 2015-08-05 DIAGNOSIS — S161XXA Strain of muscle, fascia and tendon at neck level, initial encounter: Secondary | ICD-10-CM | POA: Insufficient documentation

## 2015-08-05 DIAGNOSIS — Y998 Other external cause status: Secondary | ICD-10-CM | POA: Diagnosis not present

## 2015-08-05 DIAGNOSIS — Z79899 Other long term (current) drug therapy: Secondary | ICD-10-CM | POA: Insufficient documentation

## 2015-08-05 DIAGNOSIS — Z862 Personal history of diseases of the blood and blood-forming organs and certain disorders involving the immune mechanism: Secondary | ICD-10-CM | POA: Insufficient documentation

## 2015-08-05 DIAGNOSIS — Z87440 Personal history of urinary (tract) infections: Secondary | ICD-10-CM | POA: Insufficient documentation

## 2015-08-05 DIAGNOSIS — S0081XA Abrasion of other part of head, initial encounter: Secondary | ICD-10-CM | POA: Insufficient documentation

## 2015-08-05 DIAGNOSIS — Z8659 Personal history of other mental and behavioral disorders: Secondary | ICD-10-CM | POA: Diagnosis not present

## 2015-08-05 DIAGNOSIS — S0083XA Contusion of other part of head, initial encounter: Secondary | ICD-10-CM | POA: Insufficient documentation

## 2015-08-05 DIAGNOSIS — Z8742 Personal history of other diseases of the female genital tract: Secondary | ICD-10-CM | POA: Diagnosis not present

## 2015-08-05 DIAGNOSIS — Y9389 Activity, other specified: Secondary | ICD-10-CM | POA: Diagnosis not present

## 2015-08-05 DIAGNOSIS — Z8679 Personal history of other diseases of the circulatory system: Secondary | ICD-10-CM | POA: Diagnosis not present

## 2015-08-05 DIAGNOSIS — S199XXA Unspecified injury of neck, initial encounter: Secondary | ICD-10-CM | POA: Diagnosis present

## 2015-08-05 DIAGNOSIS — Y9289 Other specified places as the place of occurrence of the external cause: Secondary | ICD-10-CM | POA: Diagnosis not present

## 2015-08-05 MED ORDER — ONDANSETRON 4 MG PO TBDP
4.0000 mg | ORAL_TABLET | Freq: Once | ORAL | Status: AC
  Administered 2015-08-05: 4 mg via ORAL
  Filled 2015-08-05: qty 1

## 2015-08-05 MED ORDER — CYCLOBENZAPRINE HCL 5 MG PO TABS: 5.0000 mg | ORAL_TABLET | Freq: Two times a day (BID) | ORAL | Status: DC | PRN

## 2015-08-05 MED ORDER — OXYCODONE-ACETAMINOPHEN 5-325 MG PO TABS
1.0000 | ORAL_TABLET | Freq: Once | ORAL | Status: AC
  Administered 2015-08-05: 1 via ORAL

## 2015-08-05 MED ORDER — OXYCODONE-ACETAMINOPHEN 5-325 MG PO TABS
ORAL_TABLET | ORAL | Status: AC
  Filled 2015-08-05: qty 1

## 2015-08-05 MED ORDER — IBUPROFEN 600 MG PO TABS: 600.0000 mg | ORAL_TABLET | Freq: Four times a day (QID) | ORAL | Status: DC | PRN
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
## 2015-08-05 NOTE — ED Notes (Signed)
Pt ambulates independently and with steady gait at time of discharge. Discharge instructions and follow up information reviewed with patient. No other questions or concerns voiced at this time. RX x 2 given. 
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
## 2015-08-05 NOTE — Discharge Instructions (Signed)
Cervical Sprain °A cervical sprain is when the tissues (ligaments) that hold the neck bones in place stretch or tear. °HOME CARE  °· Put ice on the injured area. °¨ Put ice in a plastic bag. °¨ Place a towel between your skin and the bag. °¨ Leave the ice on for 15-20 minutes, 3-4 times a day. °· You may have been given a collar to wear. This collar keeps your neck from moving while you heal. °¨ Do not take the collar off unless told by your doctor. °¨ If you have long hair, keep it outside of the collar. °¨ Ask your doctor before changing the position of your collar. You may need to change its position over time to make it more comfortable. °¨ If you are allowed to take off the collar for cleaning or bathing, follow your doctor's instructions on how to do it safely. °¨ Keep your collar clean by wiping it with mild soap and water. Dry it completely. If the collar has removable pads, remove them every 1-2 days to hand wash them with soap and water. Allow them to air dry. They should be dry before you wear them in the collar. °¨ Do not drive while wearing the collar. °· Only take medicine as told by your doctor. °· Keep all doctor visits as told. °· Keep all physical therapy visits as told. °· Adjust your work station so that you have good posture while you work. °· Avoid positions and activities that make your problems worse. °· Warm up and stretch before being active. °GET HELP IF: °· Your pain is not controlled with medicine. °· You cannot take less pain medicine over time as planned. °· Your activity level does not improve as expected. °GET HELP RIGHT AWAY IF:  °· You are bleeding. °· Your stomach is upset. °· You have an allergic reaction to your medicine. °· You develop new problems that you cannot explain. °· You lose feeling (become numb) or you cannot move any part of your body (paralysis). °· You have tingling or weakness in any part of your body. °· Your symptoms get worse. Symptoms include: °· Pain,  soreness, stiffness, puffiness (swelling), or a burning feeling in your neck. °· Pain when your neck is touched. °· Shoulder or upper back pain. °· Limited ability to move your neck. °· Headache. °· Dizziness. °· Your hands or arms feel week, lose feeling, or tingle. °· Muscle spasms. °· Difficulty swallowing or chewing. °MAKE SURE YOU:  °· Understand these instructions. °· Will watch your condition. °· Will get help right away if you are not doing well or get worse. °  °This information is not intended to replace advice given to you by your health care provider. Make sure you discuss any questions you have with your health care provider. °  °Document Released: 12/15/2007 Document Revised: 02/28/2013 Document Reviewed: 01/03/2013 °Elsevier Interactive Patient Education ©2016 Elsevier Inc. ° °Contusion °A contusion is a deep bruise. Contusions happen when an injury causes bleeding under the skin. Symptoms of bruising include pain, swelling, and discolored skin. The skin may turn blue, purple, or yellow. °HOME CARE  °· Rest the injured area. °· If told, put ice on the injured area. °¨ Put ice in a plastic bag. °¨ Place a towel between your skin and the bag. °¨ Leave the ice on for 20 minutes, 2-3 times per day. °· If told, put light pressure (compression) on the injured area using an elastic bandage. Make sure the bandage   is not too tight. Remove it and put it back on as told by your doctor.  If possible, raise (elevate) the injured area above the level of your heart while you are sitting or lying down.  Take over-the-counter and prescription medicines only as told by your doctor. GET HELP IF:  Your symptoms do not get better after several days of treatment.  Your symptoms get worse.  You have trouble moving the injured area. GET HELP RIGHT AWAY IF:   You have very bad pain.  You have a loss of feeling (numbness) in a hand or foot.  Your hand or foot turns pale or cold.   This information is not  intended to replace advice given to you by your health care provider. Make sure you discuss any questions you have with your health care provider.   Document Released: 12/15/2007 Document Revised: 03/19/2015 Document Reviewed: 11/13/2014 Elsevier Interactive Patient Education [employer]
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
## 2015-10-03 ENCOUNTER — Encounter (HOSPITAL_COMMUNITY): Payer: Self-pay

## 2015-10-03 ENCOUNTER — Emergency Department (HOSPITAL_COMMUNITY)
Admission: EM | Admit: 2015-10-03 | Discharge: 2015-10-03 | Disposition: A | Discharge status: Home / Self Care | Payer: Medicaid Other | Attending: Physician Assistant | Admitting: Physician Assistant

## 2015-10-03 DIAGNOSIS — Z862 Personal history of diseases of the blood and blood-forming organs and certain disorders involving the immune mechanism: Secondary | ICD-10-CM | POA: Insufficient documentation

## 2015-10-03 DIAGNOSIS — R197 Diarrhea, unspecified: Secondary | ICD-10-CM | POA: Diagnosis not present

## 2015-10-03 DIAGNOSIS — Z8679 Personal history of other diseases of the circulatory system: Secondary | ICD-10-CM | POA: Diagnosis not present

## 2015-10-03 DIAGNOSIS — Z8659 Personal history of other mental and behavioral disorders: Secondary | ICD-10-CM | POA: Insufficient documentation

## 2015-10-03 DIAGNOSIS — Z872 Personal history of diseases of the skin and subcutaneous tissue: Secondary | ICD-10-CM | POA: Insufficient documentation

## 2015-10-03 DIAGNOSIS — Z3202 Encounter for pregnancy test, result negative: Secondary | ICD-10-CM | POA: Diagnosis not present

## 2015-10-03 DIAGNOSIS — R140 Abdominal distension (gaseous): Secondary | ICD-10-CM | POA: Insufficient documentation

## 2015-10-03 DIAGNOSIS — R112 Nausea with vomiting, unspecified: Secondary | ICD-10-CM | POA: Diagnosis not present

## 2015-10-03 DIAGNOSIS — Z8742 Personal history of other diseases of the female genital tract: Secondary | ICD-10-CM | POA: Diagnosis not present

## 2015-10-03 DIAGNOSIS — Z87440 Personal history of urinary (tract) infections: Secondary | ICD-10-CM | POA: Diagnosis not present

## 2015-10-03 DIAGNOSIS — Z8619 Personal history of other infectious and parasitic diseases: Secondary | ICD-10-CM | POA: Insufficient documentation

## 2015-10-03 DIAGNOSIS — E669 Obesity, unspecified: Secondary | ICD-10-CM | POA: Diagnosis not present

## 2015-10-03 LAB — COMPREHENSIVE METABOLIC PANEL
ALT: 28 U/L (ref 14–54)
AST: 23 U/L (ref 15–41)
Albumin: 3.7 g/dL (ref 3.5–5.0)
Alkaline Phosphatase: 63 U/L (ref 38–126)
Anion gap: 10 (ref 5–15)
BUN: 16 mg/dL (ref 6–20)
CO2: 24 mmol/L (ref 22–32)
Calcium: 9.2 mg/dL (ref 8.9–10.3)
Chloride: 105 mmol/L (ref 101–111)
Creatinine, Ser: 0.93 mg/dL (ref 0.44–1.00)
GFR calc Af Amer: >60 mL/min (ref >60)
GFR calc non Af Amer: >60 mL/min (ref >60)
Glucose, Bld: 86 mg/dL (ref 65–99)
Potassium: 3.8 mmol/L (ref 3.5–5.1)
Sodium: 139 mmol/L (ref 135–145)
Total Bilirubin: 0.1 mg/dL — ABNORMAL LOW (ref 0.3–1.2)
Total Protein: 7.3 g/dL (ref 6.5–8.1)

## 2015-10-03 LAB — CBC
HCT: 36 % (ref 36.0–46.0)
Hemoglobin: 11.6 g/dL — ABNORMAL LOW (ref 12.0–15.0)
MCH: 26.3 pg (ref 26.0–34.0)
MCHC: 32.2 g/dL (ref 30.0–36.0)
MCV: 81.6 fL (ref 78.0–100.0)
Platelets: 202 10*3/uL (ref 150–400)
RBC: 4.41 MIL/uL (ref 3.87–5.11)
RDW: 13.9 % (ref 11.5–15.5)
WBC: 8.9 10*3/uL (ref 4.0–10.5)

## 2015-10-03 LAB — URINE MICROSCOPIC-ADD ON

## 2015-10-03 LAB — I-STAT BETA HCG BLOOD, ED (MC, WL, AP ONLY): I-stat hCG, quantitative: <5 m[IU]/mL (ref <5)

## 2015-10-03 LAB — URINALYSIS, ROUTINE W REFLEX MICROSCOPIC
Bilirubin Urine: NEGATIVE
Glucose, UA: NEGATIVE mg/dL
Hgb urine dipstick: NEGATIVE
Ketones, ur: NEGATIVE mg/dL
Nitrite: NEGATIVE
Protein, ur: NEGATIVE mg/dL
Specific Gravity, Urine: 1.029 (ref 1.005–1.030)
pH: 6.5 (ref 5.0–8.0)

## 2015-10-03 LAB — LIPASE, BLOOD: Lipase: 23 U/L (ref 11–51)

## 2015-10-03 MED ORDER — DICYCLOMINE HCL 20 MG PO TABS: 20.0000 mg | ORAL_TABLET | Freq: Two times a day (BID) | ORAL | Status: DC

## 2015-10-03 MED ORDER — LOPERAMIDE HCL 2 MG PO CAPS: 2.0000 mg | ORAL_CAPSULE | Freq: Four times a day (QID) | ORAL | Status: DC | PRN

## 2015-10-03 MED ORDER — ONDANSETRON HCL 4 MG PO TABS: 4.0000 mg | ORAL_TABLET | Freq: Four times a day (QID) | ORAL | Status: DC

## 2015-10-03 MED ORDER — DICYCLOMINE HCL 10 MG PO CAPS
10.0000 mg | ORAL_CAPSULE | Freq: Once | ORAL | Status: AC
  Administered 2015-10-03: 10 mg via ORAL
  Filled 2015-10-03: qty 1

## 2015-10-03 NOTE — Discharge Instructions (Signed)
There does not appear to be an emergent cause for your symptoms at this time. Your exam and labs were reassuring. Take your medications as prescribed to help with your symptoms. Be sure to stay well hydrated and drink lots of water or Gatorade. Follow-up with your doctor next week for reevaluation. Return to ED for new or worsening symptoms as we discussed.

## 2015-10-03 NOTE — ED Provider Notes (Signed)
CSN: [id number]     Arrival date & time 10/03/15  1727 History  By signing my name below, I, Freida Busman, attest that this documentation has been prepared under the direction and in the presence of non-physician practitioner, Joycie Peek, PA-C. Electronically Signed: Freida Busman, Scribe. 10/03/2015. 7:34 PM.     Chief Complaint  Patient presents with  . Abdominal Pain  . Bloated    The history is provided by the patient. No language interpreter was used.    HPI Comments:  Chloe Rodgers is a 25 y.o. female with a history of UTI and PID, who presents to the Emergency Department complaining of abdominal bloating x 3 days. Her pain is constant but waxes and wanes in severity. She also notes her pain radiates to her back. Pt  has been taking ibuprofen without relief. She reports associated diarrhea and 1 episode of vomiting today. She denies fever, and vaginal bleeding.  She has 1 sexual partner and they use condoms. She denies recent new sexual partners. Her LNMP was ~ 4 days ago.  Past Medical History  Diagnosis Date  . Anemia   . Depression   . Complication of anesthesia     ??, seizure with wisdom teeth  . Urinary tract infection   . Psoriasis   . PID (acute pelvic inflammatory disease)   . Chlamydia   . PONV (postoperative nausea and vomiting)   . Seizures [facility])     July 2013 - [place]  . Headache(784.0)     otc med prn  . Anxiety   . Migraine    Past Surgical History  Procedure Laterality Date  . Cesarean section    . Skin graft      off abd, onto arm  . Wisdom tooth extraction    . Cesarean section  06/06/2012    Procedure: CESAREAN SECTION;  Surgeon: Adam Phenix, MD;  Location: [facility];  Service: Obstetrics;  Laterality: N/A;   Family History  Problem Relation Age of Onset  . Hypertension Mother   . Diabetes Mother   . Cancer Mother     BREAST  . Stroke Mother   . Seizures Mother   . Asthma Daughter   . Hypertension Maternal Grandmother   .  Diabetes Maternal Grandmother   . Cancer Maternal Grandmother     BONE CANCER  . Other Neg Hx    Social History  Substance Use Topics  . Smoking status: Never Smoker   . Smokeless tobacco: Never Used  . Alcohol Use: No     Comment: socially but none with pregnancy   OB History    Gravida Para Term Preterm AB TAB SAB Ectopic Multiple Living   0 1 1 0 0 0 2     Review of Systems  10 systems reviewed and all are negative for acute change except as noted in the HPI.  Allergies  Review of patient's allergies indicates no known allergies.  Home Medications   Prior to Admission medications   Medication Sig Start Date End Date Taking? Authorizing Provider  cyclobenzaprine (FLEXERIL) 5 MG tablet Take 1 tablet (5 mg total) by mouth 2 (two) times daily as needed for muscle spasms. 08/05/15   Teressa Lower, NP  dicyclomine (BENTYL) 20 MG tablet Take 1 tablet (20 mg total) by mouth 2 (two) times daily. 10/03/15   Joycie Peek, PA-C  fluconazole (DIFLUCAN) 150 MG tablet Take 1 tablet (150 mg total) by mouth once. 07/04/15   Abigail  Harris, PA-C  ibuprofen (ADVIL,MOTRIN) 600 MG tablet Take 1 tablet (600 mg total) by mouth every 6 (six) hours as needed. 08/05/15   Teressa LowerVrinda Pickering, NP  ketoconazole (NIZORAL) 2 % cream Apply 1 application topically daily. 07/04/15   Arthor CaptainAbigail Harris, PA-C  loperamide (IMODIUM) 2 MG capsule Take 1 capsule (2 mg total) by mouth 4 (four) times daily as needed for diarrhea or loose stools. 10/03/15   Joycie PeekBenjamin Terri Malerba, PA-C  metroNIDAZOLE (FLAGYL) 500 MG tablet Take 1 tablet (500 mg total) by mouth 2 (two) times daily. 07/04/15   Arthor CaptainAbigail Harris, PA-C  naproxen (NAPROSYN) 500 MG tablet Take 1 tablet (500 mg total) by mouth 2 (two) times daily with a meal. 07/04/15   Arthor CaptainAbigail Harris, PA-C  ondansetron (ZOFRAN) 4 MG tablet Take 1 tablet (4 mg total) by mouth every 6 (six) hours. 10/03/15   Joycie PeekBenjamin Latorie Montesano, PA-C  oxyCODONE-acetaminophen (PERCOCET/ROXICET) 5-325 MG  tablet Take 1 tablet by mouth every 4 (four) hours as needed for severe pain. 07/04/15   Abigail Harris, PA-C   BP 126/80 mmHg  Pulse 86  Temp(Src) 98.5 F (36.9 C) (Oral)  Resp 20  SpO2 100%  LMP 09/29/2015 Physical Exam  Constitutional: She is oriented to person, place, and time. She appears well-developed and well-nourished. No distress.  Obese African-American female. No apparent distress  HENT:  Head: Normocephalic and atraumatic.  Mouth/Throat: Oropharynx is clear and moist.  Eyes: Conjunctivae are normal. Pupils are equal, round, and reactive to light. Right eye exhibits no discharge. Left eye exhibits no discharge. No scleral icterus.  Neck: Neck supple.  Cardiovascular: Normal rate, regular rhythm and normal heart sounds.   Pulmonary/Chest: Effort normal and breath sounds normal. No respiratory distress. She has no wheezes. She has no rales.  Abdominal: Soft. She exhibits no distension and no mass. There is no tenderness. There is no rebound and no guarding.  Musculoskeletal: She exhibits no tenderness.  Neurological: She is alert and oriented to person, place, and time.  Cranial Nerves II-XII grossly intact  Skin: Skin is warm and dry. No rash noted.  Psychiatric: She has a normal mood and affect.  Nursing note and vitals reviewed.   ED Course  Procedures   DIAGNOSTIC STUDIES:  Oxygen Saturation is 98% on RA, normal by my interpretation.    COORDINATION OF CARE:  7:53 PM Discussed treatment plan with pt at bedside and pt agreed to plan.  Labs Review Labs Reviewed  COMPREHENSIVE METABOLIC PANEL - Abnormal; Notable for the following:    Total Bilirubin 0.1 (*)    All other components within normal limits  CBC - Abnormal; Notable for the following:    Hemoglobin 11.6 (*)    All other components within normal limits  URINALYSIS, ROUTINE W REFLEX MICROSCOPIC (NOT AT [facility]) - Abnormal; Notable for the following:    APPearance HAZY (*)    Leukocytes, UA TRACE (*)     All other components within normal limits  URINE MICROSCOPIC-ADD ON - Abnormal; Notable for the following:    Squamous Epithelial / LPF 6-30 (*)    Bacteria, UA FEW (*)    All other components within normal limits  LIPASE, BLOOD  I-STAT BETA HCG BLOOD, ED ([facility], [facility], [facility] ONLY)    Imaging Review No results found. I have personally reviewed and evaluated these images and lab results as part of my medical decision-making.    Filed Vitals:   10/03/15 1734 10/03/15 2045  BP: 126/70 126/80  Pulse: 107 86  Temp:  97.8 F (36.6 C) 98.5 F (36.9 C)  TempSrc: Oral Oral  Resp: 16 20  SpO2: 98% 100%   Meds given in ED:  Medications  dicyclomine (BENTYL) capsule 10 mg (10 mg Oral Given 10/03/15 2022)    Discharge Medication List as of 10/03/2015  8:44 PM    START taking these medications   Details  dicyclomine (BENTYL) 20 MG tablet Take 1 tablet (20 mg total) by mouth 2 (two) times daily., Starting 10/03/2015, Until Discontinued, Print    loperamide (IMODIUM) 2 MG capsule Take 1 capsule (2 mg total) by mouth 4 (four) times daily as needed for diarrhea or loose stools., Starting 10/03/2015, Until Discontinued, Print    ondansetron (ZOFRAN) 4 MG tablet Take 1 tablet (4 mg total) by mouth every 6 (six) hours., Starting 10/03/2015, Until Discontinued, Print        MDM   Patient is nontoxic, nonseptic appearing, in no apparent distress.  Patient's pain and other symptoms adequately managed in emergency department.  She reports symptoms resolved after oral dicyclomine. Oral fluids given and tolerated well.  Labs, imaging and vitals reviewed. Overall unremarkable. Patient does not meet the SIRS or Sepsis criteria.  On repeat exam patient does not have a surgical abdomin and there are no peritoneal signs.  No indication of appendicitis, bowel obstruction, bowel perforation, cholecystitis, diverticulitis, PID or ectopic pregnancy.  Patient discharged home with symptomatic treatment and given strict  instructions for follow-up with their primary care physician.  I have also discussed reasons to return immediately to the ER.  Patient expresses understanding and agrees with plan.   Final diagnoses:  Nausea vomiting and diarrhea  Abdominal bloating    I personally performed the services described in this documentation, which was scribed in my presence. The recorded information has been reviewed and is accurate.    Joycie Peek, PA-C 10/03/15 2120  Courteney Randall An, MD 10/04/15 0100

## 2015-10-03 NOTE — ED Notes (Signed)
Pt reports generalized abdominal pain that radiates to her back. She reports her abdomen feels bloated. She reports diarrhea. Symptoms onset 3 days ago.

## 2015-10-04 ENCOUNTER — Emergency Department (HOSPITAL_COMMUNITY)
Admission: EM | Admit: 2015-10-04 | Discharge: 2015-10-04 | Disposition: A | Discharge status: Home / Self Care | Payer: Medicaid Other | Attending: Emergency Medicine | Admitting: Emergency Medicine

## 2015-10-04 ENCOUNTER — Encounter (HOSPITAL_COMMUNITY): Payer: Self-pay

## 2015-10-04 DIAGNOSIS — R102 Pelvic and perineal pain: Secondary | ICD-10-CM | POA: Insufficient documentation

## 2015-10-04 DIAGNOSIS — N898 Other specified noninflammatory disorders of vagina: Secondary | ICD-10-CM | POA: Insufficient documentation

## 2015-10-04 LAB — URINE MICROSCOPIC-ADD ON

## 2015-10-04 LAB — URINALYSIS, ROUTINE W REFLEX MICROSCOPIC
Bilirubin Urine: NEGATIVE
Glucose, UA: NEGATIVE mg/dL
Hgb urine dipstick: NEGATIVE
Ketones, ur: NEGATIVE mg/dL
Nitrite: NEGATIVE
Protein, ur: NEGATIVE mg/dL
Specific Gravity, Urine: 1.023 (ref 1.005–1.030)
pH: 6 (ref 5.0–8.0)

## 2015-10-04 NOTE — ED Notes (Addendum)
no answer fro pt in waiting room. x1

## 2015-10-04 NOTE — ED Notes (Signed)
Pt. Having bilateral flank pain that started late last night,  She is also having vaginal pain burning and discharge. She denies any vaignal bleeding, hematuria.   She also denies any dysuria.  She denies any n/v/d  Skin is warm and dry.

## 2015-10-04 NOTE — ED Notes (Signed)
No answer from pt in waiting room 

## 2015-10-05 ENCOUNTER — Emergency Department (HOSPITAL_COMMUNITY)
Admission: EM | Admit: 2015-10-05 | Discharge: 2015-10-05 | Disposition: A | Discharge status: Home / Self Care | Payer: Medicaid Other | Attending: Emergency Medicine | Admitting: Emergency Medicine

## 2015-10-05 ENCOUNTER — Encounter (HOSPITAL_COMMUNITY): Payer: Self-pay | Admitting: Emergency Medicine

## 2015-10-05 DIAGNOSIS — B9689 Other specified bacterial agents as the cause of diseases classified elsewhere: Secondary | ICD-10-CM

## 2015-10-05 DIAGNOSIS — N760 Acute vaginitis: Secondary | ICD-10-CM | POA: Insufficient documentation

## 2015-10-05 DIAGNOSIS — Z8619 Personal history of other infectious and parasitic diseases: Secondary | ICD-10-CM | POA: Insufficient documentation

## 2015-10-05 DIAGNOSIS — Z3202 Encounter for pregnancy test, result negative: Secondary | ICD-10-CM | POA: Insufficient documentation

## 2015-10-05 DIAGNOSIS — Z87440 Personal history of urinary (tract) infections: Secondary | ICD-10-CM | POA: Insufficient documentation

## 2015-10-05 DIAGNOSIS — M546 Pain in thoracic spine: Secondary | ICD-10-CM | POA: Diagnosis not present

## 2015-10-05 DIAGNOSIS — Z8679 Personal history of other diseases of the circulatory system: Secondary | ICD-10-CM | POA: Diagnosis not present

## 2015-10-05 DIAGNOSIS — Z8659 Personal history of other mental and behavioral disorders: Secondary | ICD-10-CM | POA: Insufficient documentation

## 2015-10-05 LAB — URINALYSIS, ROUTINE W REFLEX MICROSCOPIC
Bilirubin Urine: NEGATIVE
Glucose, UA: NEGATIVE mg/dL
Hgb urine dipstick: NEGATIVE
Ketones, ur: NEGATIVE mg/dL
Leukocytes, UA: NEGATIVE
Nitrite: NEGATIVE
Protein, ur: NEGATIVE mg/dL
Specific Gravity, Urine: 1.025 (ref 1.005–1.030)
pH: 7 (ref 5.0–8.0)

## 2015-10-05 LAB — WET PREP, GENITAL
Sperm: NONE SEEN
Trich, Wet Prep: NONE SEEN
Yeast Wet Prep HPF POC: NONE SEEN

## 2015-10-05 LAB — I-STAT BETA HCG BLOOD, ED (MC, WL, AP ONLY): I-stat hCG, quantitative: <5 m[IU]/mL (ref <5)

## 2015-10-05 MED ORDER — DOXYCYCLINE HYCLATE 100 MG PO CAPS: 100.0000 mg | ORAL_CAPSULE | Freq: Two times a day (BID) | ORAL | Status: DC

## 2015-10-05 MED ORDER — IBUPROFEN 600 MG PO TABS: 600.0000 mg | ORAL_TABLET | Freq: Four times a day (QID) | ORAL | Status: DC | PRN

## 2015-10-05 MED ORDER — METRONIDAZOLE 500 MG PO TABS: 500.0000 mg | ORAL_TABLET | Freq: Two times a day (BID) | ORAL | Status: DC

## 2015-10-05 MED ORDER — METHOCARBAMOL 500 MG PO TABS
1000.0000 mg | ORAL_TABLET | Freq: Once | ORAL | Status: AC
  Administered 2015-10-05: 1000 mg via ORAL
  Filled 2015-10-05: qty 2

## 2015-10-05 MED ORDER — KETOROLAC TROMETHAMINE 60 MG/2ML IM SOLN
60.0000 mg | Freq: Once | INTRAMUSCULAR | Status: AC
  Administered 2015-10-05: 60 mg via INTRAMUSCULAR
  Filled 2015-10-05: qty 2

## 2015-10-05 MED ORDER — LIDOCAINE HCL (PF) 1 % IJ SOLN
INTRAMUSCULAR | Status: AC
  Administered 2015-10-05: 5 mL
  Filled 2015-10-05: qty 5

## 2015-10-05 MED ORDER — METHOCARBAMOL 500 MG PO TABS: 500.0000 mg | ORAL_TABLET | Freq: Two times a day (BID) | ORAL | Status: DC

## 2015-10-05 MED ORDER — AZITHROMYCIN 250 MG PO TABS
1000.0000 mg | ORAL_TABLET | Freq: Once | ORAL | Status: AC
  Administered 2015-10-05: 1000 mg via ORAL
  Filled 2015-10-05: qty 4

## 2015-10-05 MED ORDER — CEFTRIAXONE SODIUM 250 MG IJ SOLR
250.0000 mg | Freq: Once | INTRAMUSCULAR | Status: AC
  Administered 2015-10-05: 250 mg via INTRAMUSCULAR
  Filled 2015-10-05: qty 250

## 2015-10-05 NOTE — ED Notes (Signed)
Pt c/o continuing back pain x 4-5 days. Pt reports pain seems to be moving up to her underarm area. Pt has been seen here for same, she left yesterday due to wait time.

## 2015-10-05 NOTE — ED Provider Notes (Signed)
CSN: [id number]     Arrival date & time 10/05/15  1006 History   First MD Initiated Contact with Patient 10/05/15 1137     Chief Complaint  Patient presents with  . Back Pain   HPI   Chloe Rodgers is a 25 y.o. female PMH significant for pelvic inflammatory disease, anxiety, depression presenting with a 4 day history of back pain and a 2 day history of vaginal odor and itching. She states her back pain is worsened with movement, is constant, 10 out of 10 pain scale, nonradiating, located in her right flank specifically under her right shoulder blade. She denies fevers, chills, shortness of breath, chest pain, nausea, vomiting, abdominal pain, urinary complaints.  Past Medical History  Diagnosis Date  . Anemia   . Depression   . Complication of anesthesia     ??, seizure with wisdom teeth  . Urinary tract infection   . Psoriasis   . PID (acute pelvic inflammatory disease)   . Chlamydia   . PONV (postoperative nausea and vomiting)   . Seizures [facility]     [place]  . Headache(784.0)     otc med prn  . Anxiety   . Migraine    Past Surgical History  Procedure Laterality Date  . Cesarean section    . Skin graft      off abd, onto arm  . Wisdom tooth extraction    . Cesarean section  06/06/2012    Procedure: CESAREAN SECTION;  Surgeon: Adam PhenixJames G Arnold, MD;  Location: [facility];  Service: Obstetrics;  Laterality: N/A;   Family History  Problem Relation Age of Onset  . Hypertension Mother   . Diabetes Mother   . Cancer Mother     BREAST  . Stroke Mother   . Seizures Mother   . Asthma Daughter   . Hypertension Maternal Grandmother   . Diabetes Maternal Grandmother   . Cancer Maternal Grandmother     BONE CANCER  . Other Neg Hx    Social History  Substance Use Topics  . Smoking status: Never Smoker   . Smokeless tobacco: Never Used  . Alcohol Use: No     Comment: socially but none with pregnancy   OB History    Gravida Para Term Preterm AB TAB SAB Ectopic  Multiple Living   4 2 2  0 1 1 0 0 0 2     Review of Systems  Ten systems are reviewed and are negative for acute change except as noted in the HPI  Allergies  Review of patient's allergies indicates no known allergies.  Home Medications   Prior to Admission medications   Medication Sig Start Date End Date Taking? Authorizing Provider  dicyclomine (BENTYL) 20 MG tablet Take 1 tablet (20 mg total) by mouth 2 (two) times daily. 10/03/15  Yes Benjamin Cartner, PA-C  ibuprofen (ADVIL,MOTRIN) 600 MG tablet Take 1 tablet (600 mg total) by mouth every 6 (six) hours as needed. Patient taking differently: Take 600 mg by mouth every 6 (six) hours as needed for moderate pain.  08/05/15  Yes Teressa LowerVrinda Pickering, NP  naproxen (NAPROSYN) 500 MG tablet Take 1 tablet (500 mg total) by mouth 2 (two) times daily with a meal. Patient taking differently: Take 500 mg by mouth daily as needed for moderate pain.  07/04/15  Yes Arthor CaptainAbigail Harris, PA-C  loperamide (IMODIUM) 2 MG capsule Take 1 capsule (2 mg total) by mouth 4 (four) times daily as needed for diarrhea or  loose stools. Patient not taking: Reported on 10/05/2015 10/03/15   Joycie Peek, PA-C  ondansetron (ZOFRAN) 4 MG tablet Take 1 tablet (4 mg total) by mouth every 6 (six) hours. Patient not taking: Reported on 10/05/2015 10/03/15   Joycie Peek, PA-C   BP 113/79 mmHg  Pulse 98  Temp(Src) 97.8 F (36.6 C) (Oral)  Resp 18  Ht  (1.676 m)  Wt 103.477 kg  BMI 36.84 kg/m2  SpO2 100%  LMP 09/29/2015 Physical Exam  Constitutional: She appears well-developed and well-nourished. No distress.  HENT:  Head: Normocephalic and atraumatic.  Mouth/Throat: Oropharynx is clear and moist. No oropharyngeal exudate.  Eyes: Conjunctivae are normal. Pupils are equal, round, and reactive to light. Right eye exhibits no discharge. Left eye exhibits no discharge. No scleral icterus.  Neck: No tracheal deviation present.  Cardiovascular: Normal rate, regular  rhythm, normal heart sounds and intact distal pulses.  Exam reveals no gallop and no friction rub.   No murmur heard. Pulmonary/Chest: Effort normal and breath sounds normal. No respiratory distress. She has no wheezes. She has no rales.   She exhibits tenderness.  Abdominal: Soft. Bowel sounds are normal. She exhibits no distension and no mass. There is no tenderness. There is no rebound and no guarding.  Genitourinary:  Pelvic exam: normal external genitalia, vulva, vagina, cervix, uterus. Mild cervical motion tenderness, mild bilateral adnexal tenderness.    Musculoskeletal: She exhibits no edema.  Lymphadenopathy:    She has no cervical adenopathy.  Neurological: She is alert. Coordination normal.  Skin: Skin is warm and dry. No rash noted. She is not diaphoretic. No erythema.  Psychiatric: She has a normal mood and affect. Her behavior is normal.  Nursing note and vitals reviewed.   ED Course  Procedures Labs Review Labs Reviewed  WET PREP, GENITAL - Abnormal; Notable for the following:    Clue Cells Wet Prep HPF POC PRESENT (*)    WBC, Wet Prep HPF POC MANY (*)    All other components within normal limits  URINALYSIS, ROUTINE W REFLEX MICROSCOPIC (NOT AT [facility])  HIV ANTIBODY (ROUTINE TESTING)  RPR  I-STAT BETA HCG BLOOD, ED ([facility], [facility], [facility] ONLY)  GC/CHLAMYDIA PROBE AMP ([place]) NOT AT [facility]    MDM   Final diagnoses:  Bacterial vaginosis  Right-sided thoracic back pain   Patient nontoxic-appearing, vital signs stable. Patient with back pain.  No neurological deficits and normal neuro exam.  Patient can walk but states is painful.  No loss of bowel or bladder control.  No concern for cauda equina.  No fever, night sweats, weight loss, h/o cancer, IVDU.  RICE protocol and pain medicine indicated and discussed with patient.   Patient to be discharged with instructions to follow up with OBGYN. Discussed importance of using protection when sexually active. Pt understands that  they have GC/Chlamydia cultures pending and that they will need to inform all sexual partners if results return positive. Pt has been treated prophylacticly with azithromycin and rocephin due to pts history, pelvic exam, and wet prep with increased WBCs. Pt not concerning for PID because hemodynamically stable and no cervical motion tenderness on pelvic exam. Pt has also been treated with flagyl for Bacterial Vaginosis. Pt has been advised to not drink alcohol while on this medication.    Melton Krebs, PA-C 10/09/15 1007  Rolan Bucco, MD 10/11/15 0700

## 2015-10-05 NOTE — Discharge Instructions (Signed)
Ms. Albertina ParrShakilla Plate,  Nice meeting you! Please follow-up with your primary care provider. Return to the emergency department if you develop increased pain, shortness of breath, chest pain. Feel better soon!  S. Lane HackerNicole Sydney Azure, PA-C   Back Pain, Adult Back pain is very common. The pain often gets better over time. The cause of back pain is usually not dangerous. Most people can learn to manage their back pain on their own.  HOME CARE  Watch your back pain for any changes. The following actions may help to lessen any pain you are feeling:  Stay active. Start with short walks on flat ground if you can. Try to walk farther each day.  Exercise regularly as told by your doctor. Exercise helps your back heal faster. It also helps avoid future injury by keeping your muscles strong and flexible.  Do not sit, drive, or stand in one place for more than 30 minutes.  Do not stay in bed. Resting more than 1-2 days can slow down your recovery.  Be careful when you bend or lift an object. Use good form when lifting:  Bend at your knees.  Keep the object close to your body.  Do not twist.  Sleep on a firm mattress. Lie on your side, and bend your knees. If you lie on your back, put a pillow under your knees.  Take medicines only as told by your doctor.  Put ice on the injured area.  Put ice in a plastic bag.  Place a towel between your skin and the bag.  Leave the ice on for 20 minutes, 2-3 times a day for the first 2-3 days. After that, you can switch between ice and heat packs.  Avoid feeling anxious or stressed. Find good ways to deal with stress, such as exercise.  Maintain a healthy weight. Extra weight puts stress on your back. GET HELP IF:   You have pain that does not go away with rest or medicine.  You have worsening pain that goes down into your legs or buttocks.  You have pain that does not get better in one week.  You have pain at night.  You lose weight.  You have a  fever or chills. GET HELP RIGHT AWAY IF:   You cannot control when you poop (bowel movement) or pee (urinate).  Your arms or legs feel weak.  Your arms or legs lose feeling (numbness).  You feel sick to your stomach (nauseous) or throw up (vomit).  You have belly (abdominal) pain.  You feel like you may pass out (faint).   This information is not intended to replace advice given to you by your health care provider. Make sure you discuss any questions you have with your health care provider.   Document Released: 12/15/2007 Document Revised: 07/19/2014 Document Reviewed: 10/30/2013 Elsevier Interactive Patient Education [employer]2016 [employer]

## 2015-10-06 LAB — HIV ANTIBODY (ROUTINE TESTING W REFLEX): HIV Screen 4th Generation wRfx: NONREACTIVE

## 2015-10-06 LAB — GC/CHLAMYDIA PROBE AMP (~~LOC~~) NOT AT ARMC
Chlamydia: NEGATIVE
Neisseria Gonorrhea: NEGATIVE

## 2015-10-07 LAB — RPR: RPR Ser Ql: NONREACTIVE

## 2015-10-17 ENCOUNTER — Encounter: Payer: Medicaid Other | Admitting: Family Medicine

## 2015-10-23 ENCOUNTER — Encounter: Payer: Medicaid Other | Admitting: Family Medicine

## 2015-11-02 ENCOUNTER — Emergency Department (HOSPITAL_COMMUNITY)
Admission: EM | Admit: 2015-11-02 | Discharge: 2015-11-02 | Disposition: A | Discharge status: Home / Self Care | Payer: Medicaid Other | Attending: Emergency Medicine | Admitting: Emergency Medicine

## 2015-11-02 ENCOUNTER — Emergency Department (HOSPITAL_COMMUNITY): Payer: Medicaid Other

## 2015-11-02 ENCOUNTER — Encounter (HOSPITAL_COMMUNITY): Payer: Self-pay

## 2015-11-02 DIAGNOSIS — Z8679 Personal history of other diseases of the circulatory system: Secondary | ICD-10-CM | POA: Insufficient documentation

## 2015-11-02 DIAGNOSIS — R1031 Right lower quadrant pain: Secondary | ICD-10-CM | POA: Insufficient documentation

## 2015-11-02 DIAGNOSIS — R109 Unspecified abdominal pain: Secondary | ICD-10-CM

## 2015-11-02 DIAGNOSIS — Z79899 Other long term (current) drug therapy: Secondary | ICD-10-CM | POA: Diagnosis not present

## 2015-11-02 DIAGNOSIS — Z862 Personal history of diseases of the blood and blood-forming organs and certain disorders involving the immune mechanism: Secondary | ICD-10-CM | POA: Insufficient documentation

## 2015-11-02 DIAGNOSIS — Z87440 Personal history of urinary (tract) infections: Secondary | ICD-10-CM | POA: Insufficient documentation

## 2015-11-02 DIAGNOSIS — R1011 Right upper quadrant pain: Secondary | ICD-10-CM | POA: Diagnosis not present

## 2015-11-02 DIAGNOSIS — M545 Low back pain: Secondary | ICD-10-CM | POA: Insufficient documentation

## 2015-11-02 DIAGNOSIS — Z8619 Personal history of other infectious and parasitic diseases: Secondary | ICD-10-CM | POA: Diagnosis not present

## 2015-11-02 DIAGNOSIS — F419 Anxiety disorder, unspecified: Secondary | ICD-10-CM | POA: Insufficient documentation

## 2015-11-02 DIAGNOSIS — Z3202 Encounter for pregnancy test, result negative: Secondary | ICD-10-CM | POA: Diagnosis not present

## 2015-11-02 LAB — URINALYSIS, ROUTINE W REFLEX MICROSCOPIC
Bilirubin Urine: NEGATIVE
Glucose, UA: NEGATIVE mg/dL
Hgb urine dipstick: NEGATIVE
Ketones, ur: NEGATIVE mg/dL
Leukocytes, UA: NEGATIVE
Nitrite: NEGATIVE
Protein, ur: NEGATIVE mg/dL
Specific Gravity, Urine: 1.023 (ref 1.005–1.030)
pH: 7 (ref 5.0–8.0)

## 2015-11-02 LAB — COMPREHENSIVE METABOLIC PANEL
ALT: 20 U/L (ref 14–54)
AST: 27 U/L (ref 15–41)
Albumin: 3.2 g/dL — ABNORMAL LOW (ref 3.5–5.0)
Alkaline Phosphatase: 55 U/L (ref 38–126)
Anion gap: 12 (ref 5–15)
BUN: 6 mg/dL (ref 6–20)
CO2: 21 mmol/L — ABNORMAL LOW (ref 22–32)
Calcium: 8.9 mg/dL (ref 8.9–10.3)
Chloride: 107 mmol/L (ref 101–111)
Creatinine, Ser: 0.71 mg/dL (ref 0.44–1.00)
GFR calc Af Amer: >60 mL/min (ref >60)
GFR calc non Af Amer: >60 mL/min (ref >60)
Glucose, Bld: 107 mg/dL — ABNORMAL HIGH (ref 65–99)
Potassium: 4.3 mmol/L (ref 3.5–5.1)
Sodium: 140 mmol/L (ref 135–145)
Total Bilirubin: 0.8 mg/dL (ref 0.3–1.2)
Total Protein: 6.8 g/dL (ref 6.5–8.1)

## 2015-11-02 LAB — CBC WITH DIFFERENTIAL/PLATELET
Basophils Absolute: 0 10*3/uL (ref 0.0–0.1)
Basophils Relative: 0 %
Eosinophils Absolute: 0 10*3/uL (ref 0.0–0.7)
Eosinophils Relative: 0 %
HCT: 36.8 % (ref 36.0–46.0)
Hemoglobin: 11.9 g/dL — ABNORMAL LOW (ref 12.0–15.0)
Lymphocytes Relative: 22 %
Lymphs Abs: 1.4 10*3/uL (ref 0.7–4.0)
MCH: 27 pg (ref 26.0–34.0)
MCHC: 32.3 g/dL (ref 30.0–36.0)
MCV: 83.4 fL (ref 78.0–100.0)
Monocytes Absolute: 0.4 10*3/uL (ref 0.1–1.0)
Monocytes Relative: 7 %
Neutro Abs: 4.7 10*3/uL (ref 1.7–7.7)
Neutrophils Relative %: 71 %
Platelets: 152 10*3/uL (ref 150–400)
RBC: 4.41 MIL/uL (ref 3.87–5.11)
RDW: 14.1 % (ref 11.5–15.5)
WBC: 6.6 10*3/uL (ref 4.0–10.5)

## 2015-11-02 LAB — LIPASE, BLOOD: Lipase: 18 U/L (ref 11–51)

## 2015-11-02 LAB — POC URINE PREG, ED: Preg Test, Ur: NEGATIVE

## 2015-11-02 LAB — D-DIMER, QUANTITATIVE (NOT AT ARMC): D-Dimer, Quant: <0.27 ug/mL-FEU (ref 0.00–0.50)

## 2015-11-02 MED ORDER — MORPHINE SULFATE (PF) 4 MG/ML IV SOLN
4.0000 mg | Freq: Once | INTRAVENOUS | Status: AC
  Administered 2015-11-02: 4 mg via INTRAVENOUS
  Filled 2015-11-02: qty 1

## 2015-11-02 MED ORDER — KETOROLAC TROMETHAMINE 30 MG/ML IJ SOLN
30.0000 mg | Freq: Once | INTRAMUSCULAR | Status: AC
  Administered 2015-11-02: 30 mg via INTRAVENOUS
  Filled 2015-11-02: qty 1

## 2015-11-02 MED ORDER — NAPROXEN 500 MG PO TABS: 500.0000 mg | ORAL_TABLET | Freq: Two times a day (BID) | ORAL | Status: DC

## 2015-11-02 MED ORDER — CYCLOBENZAPRINE HCL 10 MG PO TABS: 10.0000 mg | ORAL_TABLET | Freq: Two times a day (BID) | ORAL | Status: DC | PRN

## 2015-11-02 NOTE — ED Provider Notes (Signed)
CSN: [id number]     Arrival date & time 11/02/15  4098 History   First MD Initiated Contact with Patient 11/02/15 [phone]     Chief Complaint  Patient presents with  . Flank Pain     (Consider location/radiation/quality/duration/timing/severity/associated sxs/prior Treatment) HPI  25 year old female presents with right flank pain. She is concerned that she has a kidney infection. She states she's had pain like this before that was both a kidney stone and an infection. She states she did not have surgery but required IV antibiotics at that time. This was about one year ago. Since yesterday she's been having gradually worsening right-sided back/flank pain. Radiates up into her chest. Occasionally feel short of breath, unclear if this is from pain. No urinary symptoms including hematuria, dysuria, or frequency. No vaginal discharge or bleeding. Pain is currently severe. No nausea/vomiting  Past Medical History  Diagnosis Date  . Anemia   . Depression   . Complication of anesthesia     ??, seizure with wisdom teeth  . Urinary tract infection   . Psoriasis   . PID (acute pelvic inflammatory disease)   . Chlamydia   . PONV (postoperative nausea and vomiting)   . Seizures [facility])     July 2013 - [place]  . Headache(784.0)     otc med prn  . Anxiety   . Migraine    Past Surgical History  Procedure Laterality Date  . Cesarean section    . Skin graft      off abd, onto arm  . Wisdom tooth extraction    . Cesarean section  06/06/2012    Procedure: CESAREAN SECTION;  Surgeon: Adam Phenix, MD;  Location: [facility];  Service: Obstetrics;  Laterality: N/A;   Family History  Problem Relation Age of Onset  . Hypertension Mother   . Diabetes Mother   . Cancer Mother     BREAST  . Stroke Mother   . Seizures Mother   . Asthma Daughter   . Hypertension Maternal Grandmother   . Diabetes Maternal Grandmother   . Cancer Maternal Grandmother     BONE CANCER  . Other Neg Hx    Social  History  Substance Use Topics  . Smoking status: Never Smoker   . Smokeless tobacco: Never Used  . Alcohol Use: No     Comment: socially but none with pregnancy   OB History    Gravida Para Term Preterm AB TAB SAB Ectopic Multiple Living   0 1 1 0 0 0 2     Review of Systems  Constitutional: Negative for fever.  Respiratory: Negative for cough.   Gastrointestinal: Positive for abdominal pain. Negative for nausea and vomiting.  Genitourinary: Positive for flank pain. Negative for dysuria, frequency, hematuria, vaginal bleeding and vaginal discharge.  Musculoskeletal: Positive for back pain.  All other systems reviewed and are negative.     Allergies  Review of patient's allergies indicates no known allergies.  Home Medications   Prior to Admission medications   Medication Sig Start Date End Date Taking? Authorizing Provider  dicyclomine (BENTYL) 20 MG tablet Take 1 tablet (20 mg total) by mouth 2 (two) times daily. 10/03/15   Joycie Peek, PA-C  doxycycline (VIBRAMYCIN) 100 MG capsule Take 1 capsule (100 mg total) by mouth 2 (two) times daily. 10/05/15   Melton Krebs, PA-C  ibuprofen (ADVIL,MOTRIN) 600 MG tablet Take 1 tablet (600 mg total) by mouth every 6 (six) hours as needed. 10/05/15  Melton Krebs, PA-C  loperamide (IMODIUM) 2 MG capsule Take 1 capsule (2 mg total) by mouth 4 (four) times daily as needed for diarrhea or loose stools. Patient not taking: Reported on 10/05/2015 10/03/15   Joycie Peek, PA-C  methocarbamol (ROBAXIN) 500 MG tablet Take 1 tablet (500 mg total) by mouth 2 (two) times daily. 10/05/15   Melton Krebs, PA-C  metroNIDAZOLE (FLAGYL) 500 MG tablet Take 1 tablet (500 mg total) by mouth 2 (two) times daily. 10/05/15   Melton Krebs, PA-C  naproxen (NAPROSYN) 500 MG tablet Take 1 tablet (500 mg total) by mouth 2 (two) times daily with a meal. Patient taking differently: Take 500 mg by mouth daily as needed for  moderate pain.  07/04/15   Arthor Captain, PA-C  ondansetron (ZOFRAN) 4 MG tablet Take 1 tablet (4 mg total) by mouth every 6 (six) hours. Patient not taking: Reported on 10/05/2015 10/03/15   Joycie Peek, PA-C   BP 133/74 mmHg  Pulse 81  Temp(Src) 97.7 F (36.5 C) (Oral)  Resp 18  Ht  (1.676 m)  Wt 210 lb (95.255 kg)  BMI 33.91 kg/m2  SpO2 99%  LMP 09/29/2015 Physical Exam  Constitutional: She is oriented to person, place, and time. She appears well-developed and well-nourished.  HENT:  Head: Normocephalic and atraumatic.  Right Ear: External ear normal.  Left Ear: External ear normal.  Nose: Nose normal.  Eyes: Right eye exhibits no discharge. Left eye exhibits no discharge.  Cardiovascular: Normal rate, regular rhythm and normal heart sounds.   Pulmonary/Chest: Effort normal and breath sounds normal.  Abdominal: Soft. There is tenderness in the right upper quadrant and right lower quadrant. There is CVA tenderness.  Musculoskeletal:       Thoracic back: She exhibits tenderness.       Back:  Neurological: She is alert and oriented to person, place, and time.  Skin: Skin is warm and dry.  Nursing note and vitals reviewed.   ED Course  Procedures (including critical care time) Labs Review Labs Reviewed  URINALYSIS, ROUTINE W REFLEX MICROSCOPIC (NOT AT [facility]) - Abnormal; Notable for the following:    APPearance HAZY (*)    All other components within normal limits  COMPREHENSIVE METABOLIC PANEL - Abnormal; Notable for the following:    CO2 21 (*)    Glucose, Bld 107 (*)    Albumin 3.2 (*)    All other components within normal limits  CBC WITH DIFFERENTIAL/PLATELET - Abnormal; Notable for the following:    Hemoglobin 11.9 (*)    All other components within normal limits  LIPASE, BLOOD  D-DIMER, QUANTITATIVE (NOT AT [facility])  POC URINE PREG, ED    Imaging Review Ct Renal Stone Study  11/02/2015  CLINICAL DATA:  RT FLANK PAIN AND BURNING IN VAGINA H/O RECURRENT  UTI'S AND KIDNEY STONE EXAM: CT ABDOMEN AND PELVIS WITHOUT CONTRAST TECHNIQUE: Multidetector CT imaging of the abdomen and pelvis was performed following the standard protocol without IV contrast. COMPARISON:  08/06/2013 FINDINGS: Lower chest: No pulmonary nodules, pleural effusions, or infiltrates. Heart size is normal. No imaged pericardial effusion or significant coronary artery calcifications. Upper abdomen: No focal abnormality identified within the liver, spleen, pancreas, or adrenal glands. Gallbladder is present. Intrarenal stone is identified in the upper pole the right kidney, measuring 2 mm. There is no hydronephrosis or hydroureter. No evidence for ureteral calculus. Left kidney is normal in appearance. Gastrointestinal tract: The stomach and small bowel loops are normal in appearance.  The appendix is well seen and has a normal appearance. Colonic loops are normal in appearance. Pelvis: The uterus is present without focal mass. No definite adnexal mass. Trace free pelvic fluid noted. Retroperitoneum: No retroperitoneal or mesenteric adenopathy. No evidence for aortic aneurysm. Abdominal wall: Unremarkable. Osseous structures: Unremarkable. IMPRESSION: 1. Small nonobstructing intrarenal stone in the upper pole the right kidney, measuring 2 mm. 2. No urinary tract obstruction. 3. Normal appendix. Electronically Signed   By: Norva PavlovElizabeth  Brown M.D.   On: 11/02/2015 11:00   I have personally reviewed and evaluated these images and lab results as part of my medical decision-making.   EKG Interpretation None      MDM   Final diagnoses:  Right flank pain    Unclear why patient is having right flank pain. Given where the pain is in her upper back the differential is broad. There is no cough in her lungs are clear. No hypoxia. Low risk for PE. However given no obvious source seen on initial CT stone study a d-dimer was sent but is negative. I have very low suspicion for PE. Pain has improved in the  ED. No evidence of UTI or other infection. At this point she has an unclear source of her flank pain, likely musculoskeletal. Treat with NSAIDs and muscle relaxers and follow up with PCP. Discussed return precautions.    Pricilla LovelessScott Leontina Skidmore, MD 11/02/15 [phone]

## 2015-11-02 NOTE — ED Notes (Signed)
Pt reports right flank pain that began 10/30/15.  Pt reports some discomfort with urination as well.  Pt has hx of kidney infections and reports the symptoms are similar.  Pt A&Ox4 and in NAD

## 2015-11-10 ENCOUNTER — Emergency Department (HOSPITAL_COMMUNITY): Payer: Medicaid Other

## 2015-11-10 ENCOUNTER — Encounter (HOSPITAL_COMMUNITY): Payer: Self-pay | Admitting: Emergency Medicine

## 2015-11-10 ENCOUNTER — Emergency Department (HOSPITAL_COMMUNITY)
Admission: EM | Admit: 2015-11-10 | Discharge: 2015-11-10 | Disposition: A | Discharge status: Home / Self Care | Payer: Medicaid Other | Attending: Emergency Medicine | Admitting: Emergency Medicine

## 2015-11-10 DIAGNOSIS — Z79899 Other long term (current) drug therapy: Secondary | ICD-10-CM | POA: Insufficient documentation

## 2015-11-10 DIAGNOSIS — Z87440 Personal history of urinary (tract) infections: Secondary | ICD-10-CM | POA: Diagnosis not present

## 2015-11-10 DIAGNOSIS — Y998 Other external cause status: Secondary | ICD-10-CM | POA: Diagnosis not present

## 2015-11-10 DIAGNOSIS — Z872 Personal history of diseases of the skin and subcutaneous tissue: Secondary | ICD-10-CM | POA: Insufficient documentation

## 2015-11-10 DIAGNOSIS — S99911A Unspecified injury of right ankle, initial encounter: Secondary | ICD-10-CM | POA: Insufficient documentation

## 2015-11-10 DIAGNOSIS — Z8659 Personal history of other mental and behavioral disorders: Secondary | ICD-10-CM | POA: Insufficient documentation

## 2015-11-10 DIAGNOSIS — Z792 Long term (current) use of antibiotics: Secondary | ICD-10-CM | POA: Insufficient documentation

## 2015-11-10 DIAGNOSIS — Z8742 Personal history of other diseases of the female genital tract: Secondary | ICD-10-CM | POA: Insufficient documentation

## 2015-11-10 DIAGNOSIS — Y9289 Other specified places as the place of occurrence of the external cause: Secondary | ICD-10-CM | POA: Insufficient documentation

## 2015-11-10 DIAGNOSIS — Y9301 Activity, walking, marching and hiking: Secondary | ICD-10-CM | POA: Insufficient documentation

## 2015-11-10 DIAGNOSIS — Z791 Long term (current) use of non-steroidal anti-inflammatories (NSAID): Secondary | ICD-10-CM | POA: Insufficient documentation

## 2015-11-10 DIAGNOSIS — W108XXA Fall (on) (from) other stairs and steps, initial encounter: Secondary | ICD-10-CM | POA: Diagnosis not present

## 2015-11-10 DIAGNOSIS — Z8619 Personal history of other infectious and parasitic diseases: Secondary | ICD-10-CM | POA: Insufficient documentation

## 2015-11-10 DIAGNOSIS — G43909 Migraine, unspecified, not intractable, without status migrainosus: Secondary | ICD-10-CM | POA: Diagnosis not present

## 2015-11-10 DIAGNOSIS — Z862 Personal history of diseases of the blood and blood-forming organs and certain disorders involving the immune mechanism: Secondary | ICD-10-CM | POA: Diagnosis not present

## 2015-11-10 MED ORDER — NAPROXEN 500 MG PO TABS: 500.0000 mg | ORAL_TABLET | Freq: Two times a day (BID) | ORAL | Status: DC

## 2015-11-10 MED ORDER — KETOROLAC TROMETHAMINE 60 MG/2ML IM SOLN
60.0000 mg | Freq: Once | INTRAMUSCULAR | Status: AC
  Administered 2015-11-10: 60 mg via INTRAMUSCULAR
  Filled 2015-11-10: qty 2

## 2015-11-10 NOTE — Discharge Instructions (Signed)
You have been seen today for an ankle injury. Your imaging showed no abnormalities. It is likely you have a sprain. Rest, ice, compression, and elevation as indicated in the attached literature. Take ibuprofen or naproxen for pain and inflammation Follow up with PCP should symptoms continue. Return to ED should symptoms worsen.  RESOURCE GUIDE  Chronic Pain Problems: Contact [facility]  [phone] Patients need to be referred by their primary care doctor.  Insufficient Money for Medicine: Contact United Way:  call "211" or [facility] 519-148-3348.  No Primary Care Doctor: - Call [facility] Connect  [phone] - can help you locate a primary care doctor that  accepts your insurance, provides certain services, etc. - Physician Referral Service- [phone]  Agencies that provide inexpensive medical care: - [facility]  [phone] - [facility]  [phone] - [facility]  [phone] - Women's Clinic  [phone] - Planned Parenthood  [phone] [facility]  [phone]  Medicaid-accepting [facility] Providers: - [facility]- [address]  [phone], Mon-Fri 9am-7pm, Sat 9am-1pm - [facility]- [address] [place], Suite [place]  [phone] - [facility]- [address] [place]  [phone] [facility]- [address]  [phone] - Renaye Rakers- [address] [place], [address], [phone]  Only accepts [place] patients after they have their name  applied to their card  Self Pay (no insurance) in [place]: - Sickle Cell Patients: Dr Willey Blade, [facility]  [address] [place], [phone] - [facility]- [address] [place]  [phone]       [facility] [place]- [address]       -     [facility]- see information above (Speak  to [employer] if you do not have insurance)       -  [facility]- [address] [place], [phone]       -  [facility]- [address],  [phone]       -  [facility]- [address], [phone]       -  Dr Julio Sicks-  [address], [place], [phone]       -  [facility]- [address] [phone]       -  [facility]- [address], [phone], also [address], [phone]       -    [facility]- [address] [place], [phone], 1st & 3rd Saturday   every month, 10am-1pm  1) Find a Doctor and Pay Out of Pocket Although you won't have to find out who is covered by your insurance plan, it is a good idea to ask around and get recommendations. You will then need to call the office and see if the doctor you have chosen will accept you as a new patient and what types of options they offer for patients who are self-pay. Some doctors offer discounts or will set up payment plans for their patients who do not have insurance, but you will need to ask so you aren't surprised when you get to your appointment.  2) Contact Your Local Health Department Not all health departments have doctors that can see patients  for sick visits, but many do, so it is worth a call to see if yours does. If you don't know where your local health department is, you can check in your phone book. The CDC also has a tool to help you locate your state's health department, and many state websites also have listings of all of their local health departments.  3) Find a Walk-in Clinic If your illness is not likely to be very severe or complicated, you may want to try a walk in clinic. These are popping up all over the country in pharmacies, drugstores, and shopping centers. They're usually staffed by nurse practitioners or physician assistants that have been trained to treat common illnesses and complaints. They're usually fairly quick and inexpensive.  However, if you have serious medical issues or chronic medical problems, these are probably not your best option  STD Testing - [facility] [place] [facility] [place] [facility] [place], STD Clinic, [address], [place], phone [phone] or [phone].  Monday - Friday, call for an appointment. [facility]- [place] [facility] of [facility] [place], STD Clinic, [place], [place], phone [phone] or [phone].  Monday - Friday, call for an appointment.  Abuse/Neglect: [facility]- [place] Child Abuse Hotline 367-637-7276(336) 267-356-7907 [facility]- [place] Child Abuse Hotline (573) 082-4254(858)077-3556 (After Hours)  Emergency Shelter:  [facility] (918)357-4142(336) [phone]  Maternity Homes: - Room at the [facility] [place] [phone](336) (602)[phone] - [facility] [phone]  MRSA Hotline #:   9032391175706-748-5587  [facility] Resources  [facility] [place]  United Way [facility] [address]                 [address]         [address]  [place]                                               [place]                              [place] Phone:  [phone]                                  Phone:  [phone]                   Phone:  [phone]  [facility], [phone] - [facility] Services - [phone]- [phone]       -     [facility] in [place], [address],                                  [phone], [facility]  [place] Child Abuse Hotline 838-596-1084(336) 786-745-6087 or 423-097-9235(336) 347-603-3947 (After Hours)   Behavioral Health Services  Substance Abuse Resources: - [facility] Drug Services  [phone] - [facility] [phone] - [facility] [phone] Floyd[facility] Flock- [facility] (928)[phone] - Residential & Outpatient Substance Abuse Program  [phone]  Psychological Services: Tressie Ellis- [facility]  [phone] - [facility]   [phone] - [facility], [phone] [place] [address], [place], ACCESS  LINE: (219)340-4134 or [phone], PicCapture.uy  Dental Assistance  If unable to pay or uninsured, contact:  [facility] or [facility] to become qualified for the adult dental clinic.  Patients with Medicaid: [facility] [phone] [address], [place] [address], [phone]  If unable to pay, or uninsured, contact [facility] [phone]) or [place] [phone] in [place], [place] in [facility]) to become qualified for the adult dental clinic   Other [place]- [place], [place], [place], [address], [place], [place], 2nd and 4th Thursday of the month at 6:30am.  10 clients each day by appointment, can sometimes see walk-in patients if someone does not show for an appointment. [facility]- [address] Hillard Danker [place], [place], [address], [place], [place], [place], [address], [place] Department- [place] Department- [place] Department- [phone]

## 2015-11-10 NOTE — ED Notes (Signed)
Pt has pain and swelling to right lat. Ankle   Ice pack given

## 2015-11-10 NOTE — ED Notes (Signed)
Pt st's she was coming down steps and missed last step turning her ankle over.  C/O pain to right ankle

## 2015-11-10 NOTE — ED Provider Notes (Signed)
CSN: [id number]     Arrival date & time 11/10/15  1502 History  By signing my name below, I, Emmanuella Mensah, attest that this documentation has been prepared under the direction and in the presence of Raiquan Chandler, PA-C. Electronically Signed: Angelene GiovanniEmmanuella Mensah, ED Scribe. 11/10/2015. 4:11 PM.    Chief Complaint  Patient presents with  . Ankle Pain   The history is provided by the patient. No language interpreter was used.   HPI Comments: Albertina ParrShakilla Jesus is a 25 y.o. female who presents to the Emergency Department complaining of gradually worsening moderate right ankle pain s/p fall that occurred PTA. She reports associated swelling to the right ankle and pain with ambulation. Pt explains that she missed a step as she was walking down the steps causing her to twist her right ankle. No LOC or head injury. No alleviating factors noted. Pt has not tried any medications PTA. Pt denies any other injuries. No open wounds or neuro deficits.    Past Medical History  Diagnosis Date  . Anemia   . Depression   . Complication of anesthesia     ??, seizure with wisdom teeth  . Urinary tract infection   . Psoriasis   . PID (acute pelvic inflammatory disease)   . Chlamydia   . PONV (postoperative nausea and vomiting)   . Seizures [facility])     [place]  . Headache(784.0)     otc med prn  . Anxiety   . Migraine    Past Surgical History  Procedure Laterality Date  . Cesarean section    . Skin graft      off abd, onto arm  . Wisdom tooth extraction    . Cesarean section  06/06/2012    Procedure: CESAREAN SECTION;  Surgeon: Adam PhenixJames G Arnold, MD;  Location: [facility];  Service: Obstetrics;  Laterality: N/A;   Family History  Problem Relation Age of Onset  . Hypertension Mother   . Diabetes Mother   . Cancer Mother     BREAST  . Stroke Mother   . Seizures Mother   . Asthma Daughter   . Hypertension Maternal Grandmother   . Diabetes Maternal Grandmother   . Cancer Maternal  Grandmother     BONE CANCER  . Other Neg Hx    Social History  Substance Use Topics  . Smoking status: Never Smoker   . Smokeless tobacco: Never Used  . Alcohol Use: No     Comment: socially but none with pregnancy   OB History    Gravida Para Term Preterm AB TAB SAB Ectopic Multiple Living   4 2 2  0 1 1 0 0 0 2     Review of Systems  Musculoskeletal: Positive for joint swelling and arthralgias.  Skin: Negative for wound.  Neurological: Negative for weakness and numbness.      Allergies  Review of patient's allergies indicates no known allergies.  Home Medications   Prior to Admission medications   Medication Sig Start Date End Date Taking? Authorizing Provider  cyclobenzaprine (FLEXERIL) 10 MG tablet Take 1 tablet (10 mg total) by mouth 2 (two) times daily as needed for muscle spasms. 11/02/15   Pricilla LovelessScott Goldston, MD  dicyclomine (BENTYL) 20 MG tablet Take 1 tablet (20 mg total) by mouth 2 (two) times daily. 10/03/15   Joycie PeekBenjamin Cartner, PA-C  doxycycline (VIBRAMYCIN) 100 MG capsule Take 1 capsule (100 mg total) by mouth 2 (two) times daily. 10/05/15   Melton KrebsSamantha Nicole Riley, PA-C  methocarbamol (ROBAXIN) 500 MG tablet Take 1 tablet (500 mg total) by mouth 2 (two) times daily. 10/05/15   Melton Krebs, PA-C  metroNIDAZOLE (FLAGYL) 500 MG tablet Take 1 tablet (500 mg total) by mouth 2 (two) times daily. 10/05/15   Melton Krebs, PA-C  naproxen (NAPROSYN) 500 MG tablet Take 1 tablet (500 mg total) by mouth 2 (two) times daily with a meal. 11/02/15   Pricilla Loveless, MD  naproxen (NAPROSYN) 500 MG tablet Take 1 tablet (500 mg total) by mouth 2 (two) times daily. 11/10/15   Sohrab Keelan C Xochilt Conant, PA-C   BP 122/75 mmHg  Pulse 83  Temp(Src) 97.8 F (36.6 C) (Oral)  Resp 18  Ht  (1.676 m)  Wt 102.059 kg  BMI 36.33 kg/m2  SpO2 100%  LMP 10/31/2015 (Approximate) Physical Exam  Constitutional: She is oriented to person, place, and time. She appears well-developed and  well-nourished. No distress.  HENT:  Head: Normocephalic and atraumatic.  Eyes: Conjunctivae and EOM are normal.  Neck: Neck supple.  Cardiovascular: Normal rate.   Pulmonary/Chest: Effort normal. No respiratory distress.  Musculoskeletal: Normal range of motion.  Swelling and tenderness to lateral malleolus to right ankle No discernable deformity Circulation, motor, and sensory intact distal to the injury  Neurological: She is alert and oriented to person, place, and time.  Skin: Skin is warm and dry.  Psychiatric: She has a normal mood and affect. Her behavior is normal.  Nursing note and vitals reviewed.   ED Course  Procedures (including critical care time) DIAGNOSTIC STUDIES: Oxygen Saturation is 100% on RA, normal by my interpretation.    COORDINATION OF CARE: 4:09 PM- Pt advised of plan for treatment and pt agrees. Pt will receive x-ray for further evaluation.    Imaging Review Dg Ankle Complete Right  11/10/2015  CLINICAL DATA:  Tripped coming down the stairs at missed a step at 1430 hours today, rolled ankle outwards, pain throughout ankle with lateral swelling, history of a sprained ankle a few years ago EXAM: RIGHT ANKLE - COMPLETE 3+ VIEW COMPARISON:  10/11/2012 FINDINGS: Lateral soft tissue swelling. Osseous mineralization normal. Joint spaces preserved. No acute fracture, dislocation, or bone destruction. IMPRESSION: No acute abnormalities. Electronically Signed   By: Ulyses Southward M.D.   On: 11/10/2015 16:08     Harolyn Rutherford, PA-C has personally reviewed and evaluated these images as part of his medical decision-making.   MDM   Final diagnoses:  Ankle injury, right, initial encounter    Thurza Kwiecinski presents with right ankle pain and swelling following a fall that occurred today.  No abnormalities on the patient's x-ray. Suspect ankle sprain. Ace wrap and crutches. Home care, including RICE protocol, as well as return precautions discussed. Patient voiced  understanding of these instructions and is comfortable with discharge.  I personally performed the services described in this documentation, which was scribed in my presence. The recorded information has been reviewed and is accurate.     Anselm Pancoast, PA-C 11/12/15 0127  Mancel Bale, MD 11/12/15 1113

## 2015-11-13 ENCOUNTER — Ambulatory Visit: Payer: Medicaid Other | Admitting: Obstetrics and Gynecology

## 2015-12-11 ENCOUNTER — Ambulatory Visit: Payer: Medicaid Other | Admitting: Medical

## 2016-01-01 ENCOUNTER — Emergency Department (HOSPITAL_COMMUNITY)
Admission: EM | Admit: 2016-01-01 | Discharge: 2016-01-01 | Disposition: A | Discharge status: Home / Self Care | Payer: Medicaid Other

## 2016-01-01 NOTE — ED Notes (Signed)
Pt cannot be found

## 2016-01-01 NOTE — ED Notes (Signed)
No answer at 1110. Will recheck shortly

## 2016-01-01 NOTE — ED Notes (Signed)
Pt did not answer when called x3 

## 2016-02-26 ENCOUNTER — Emergency Department (HOSPITAL_COMMUNITY)
Admission: EM | Admit: 2016-02-26 | Discharge: 2016-02-26 | Discharge status: Against Medical Advice | Payer: Medicaid Other

## 2016-02-26 NOTE — ED Triage Notes (Signed)
Called x 3 no answer 

## 2016-02-26 NOTE — ED Notes (Signed)
Called name several times. Unable to locate.

## 2016-03-08 ENCOUNTER — Encounter (HOSPITAL_COMMUNITY): Payer: Self-pay | Admitting: *Deleted

## 2016-03-08 ENCOUNTER — Emergency Department (HOSPITAL_COMMUNITY)
Admission: EM | Admit: 2016-03-08 | Discharge: 2016-03-08 | Disposition: A | Discharge status: Home / Self Care | Payer: Medicaid Other | Attending: Dermatology | Admitting: Dermatology

## 2016-03-08 DIAGNOSIS — K0889 Other specified disorders of teeth and supporting structures: Secondary | ICD-10-CM | POA: Diagnosis not present

## 2016-03-08 DIAGNOSIS — Z5321 Procedure and treatment not carried out due to patient leaving prior to being seen by health care provider: Secondary | ICD-10-CM | POA: Insufficient documentation

## 2016-03-08 NOTE — ED Triage Notes (Signed)
Pt reports teeth pain for a couple of days. Pt has appointment with dentist 8/31

## 2016-03-08 NOTE — ED Notes (Signed)
Unable to locate when called for room x3 

## 2016-03-09 ENCOUNTER — Encounter (HOSPITAL_COMMUNITY): Payer: Self-pay | Admitting: Emergency Medicine

## 2016-03-09 ENCOUNTER — Emergency Department (HOSPITAL_COMMUNITY)
Admission: EM | Admit: 2016-03-09 | Discharge: 2016-03-09 | Disposition: A | Discharge status: Home / Self Care | Payer: Medicaid Other | Attending: Emergency Medicine | Admitting: Emergency Medicine

## 2016-03-09 DIAGNOSIS — K0889 Other specified disorders of teeth and supporting structures: Secondary | ICD-10-CM | POA: Insufficient documentation

## 2016-03-09 MED ORDER — PENICILLIN V POTASSIUM 500 MG PO TABS: 500.0000 mg | ORAL_TABLET | Freq: Three times a day (TID) | ORAL | 0 refills | Status: DC

## 2016-03-09 MED ORDER — IBUPROFEN 400 MG PO TABS
800.0000 mg | ORAL_TABLET | Freq: Once | ORAL | Status: AC
  Administered 2016-03-09: 800 mg via ORAL
  Filled 2016-03-09: qty 2

## 2016-03-09 MED ORDER — IBUPROFEN 800 MG PO TABS: 800.0000 mg | ORAL_TABLET | Freq: Three times a day (TID) | ORAL | 0 refills | Status: DC

## 2016-03-09 NOTE — ED Triage Notes (Signed)
Onset one day ago developed left side dental pain worsening today. Pain currently 10/10 throbbing. Airway intact bilateral equal chest rise and fall.

## 2016-03-09 NOTE — ED Provider Notes (Signed)
[facility]-EMERGENCY DEPT Provider Note   CSN: [id number] Arrival date & time: 03/09/16  1240  By signing my name below, I, Freida Busman, attest that this documentation has been prepared under the direction and in the presence of non-physician practitioner, Dierdre Forth, PA-C. Electronically Signed: Freida Busman, Scribe. 03/09/2016. 1:39 PM.   History   Chief Complaint Chief Complaint  Patient presents with  . Dental Pain    The history is provided by the patient. No language interpreter was used.  Toothache   HPI Comments:  Chloe Rodgers is a 25 y.o. female who presents to the Emergency Department complaining of  throbbing dental pain since last night. She reports pain to both sides of her mouth. She has had her wisdom teeth removed. She notes associated mild subjective facial swelling and chills. No fever. No alleviating factors noted. She is not a smoker.     Past Medical History:  Diagnosis Date  . Anemia   . Anxiety   . Chlamydia   . Complication of anesthesia    ??, seizure with wisdom teeth  . Depression   . Headache(784.0)    otc med prn  . Migraine   . PID (acute pelvic inflammatory disease)   . PONV (postoperative nausea and vomiting)   . Psoriasis   . Seizures [facility])    July 2013 - [place]  . Urinary tract infection     Patient Active Problem List   Diagnosis Date Noted  . Chlamydia trachomatis infection 04/27/2013  . PID (acute pelvic inflammatory disease) 02/22/2013  . Nephrolithiasis 02/22/2013  . Migraines 07/24/2012  . Family conflict 06/08/2012  . Obese 03/30/2012  . Seizure disorder (HCC) 02/03/2012  . Family history of breast cancer in mother 02/03/2012    Past Surgical History:  Procedure Laterality Date  . CESAREAN SECTION    . CESAREAN SECTION  06/06/2012   Procedure: CESAREAN SECTION;  Surgeon: Adam Phenix, MD;  Location: [facility] ORS;  Service: Obstetrics;  Laterality: N/A;  . SKIN GRAFT     off abd, onto arm  . WISDOM TOOTH  EXTRACTION      OB History    Gravida Para Term Preterm AB Living   4 2 2  0 1 2   SAB TAB Ectopic Multiple Live Births   0 1 0 0 2       Home Medications    Prior to Admission medications   Medication Sig Start Date End Date Taking? Authorizing Provider  ibuprofen (ADVIL,MOTRIN) 800 MG tablet Take 1 tablet (800 mg total) by mouth 3 (three) times daily. 03/09/16   Anibal Quinby, PA-C  penicillin v potassium (VEETID) 500 MG tablet Take 1 tablet (500 mg total) by mouth 3 (three) times daily. 03/09/16   Dahlia Client Javeon Macmurray, PA-C    Family History Family History  Problem Relation Age of Onset  . Hypertension Mother   . Diabetes Mother   . Cancer Mother     BREAST  . Stroke Mother   . Seizures Mother   . Asthma Daughter   . Hypertension Maternal Grandmother   . Diabetes Maternal Grandmother   . Cancer Maternal Grandmother     BONE CANCER  . Other Neg Hx     Social History Social History  Substance Use Topics  . Smoking status: Never Smoker  . Smokeless tobacco: Never Used  . Alcohol use No     Comment: socially but none with pregnancy     Allergies   Review of patient's allergies indicates no  known allergies.   Review of Systems Review of Systems  Constitutional: Positive for chills. Negative for fever.  HENT: Positive for dental problem and facial swelling. Negative for sore throat, trouble swallowing and voice change.   Respiratory: Negative for shortness of breath.   Cardiovascular: Negative for chest pain.     Physical Exam Updated Vital Signs BP 139/95 (BP Location: Left Arm)   Pulse 82   Temp 98 F (36.7 C) (Oral)   Resp 16   Ht 5\' 6"  (1.676 m)   Wt 99.3 kg   SpO2 100%   BMI 35.35 kg/m   Physical Exam  Constitutional: She appears well-developed and well-nourished.  HENT:  Head: Normocephalic.  Right Ear: Tympanic membrane, external ear and ear canal normal.  Left Ear: Tympanic membrane, external ear and ear canal normal.  Nose: Nose  normal. Right sinus exhibits no maxillary sinus tenderness and no frontal sinus tenderness. Left sinus exhibits no maxillary sinus tenderness and no frontal sinus tenderness.  Mouth/Throat: Uvula is midline, oropharynx is clear and moist and mucous membranes are normal. No oral lesions. Abnormal dentition. Dental caries present. No uvula swelling or lacerations. No oropharyngeal exudate, posterior oropharyngeal edema, posterior oropharyngeal erythema or tonsillar abscesses.  Tooth # 2, 15, 17, and 31 with TTP without overt caries  Mild gingival erythema throughout entirety of mouth  No gross abscess No fluctuance or induration to the buccal mucosa or floor of the mouth  Eyes: Conjunctivae are normal. Pupils are equal, round, and reactive to light. Right eye exhibits no discharge. Left eye exhibits no discharge.  Neck: Normal range of motion. Neck supple.  No stridor Handling secretions without difficulty No nuchal rigidity No cervical lymphadenopathy   Cardiovascular: Normal rate, regular rhythm and normal heart sounds.   Pulmonary/Chest: Effort normal. No respiratory distress.  Equal chest rise  Abdominal: Soft. Bowel sounds are normal. She exhibits no distension. There is no tenderness.  Lymphadenopathy:    She has no cervical adenopathy.  Neurological: She is alert.  Skin: Skin is warm and dry.  Psychiatric: She has a normal mood and affect.  Nursing note and vitals reviewed.    ED Treatments / Results  DIAGNOSTIC STUDIES:  Oxygen Saturation is 100% on room air, normal by my interpretation.    COORDINATION OF CARE:  1:39 PM Discussed treatment plan with pt at bedside and pt agreed to plan.   Procedures Procedures (including critical care time)  Medications Ordered in ED Medications  ibuprofen (ADVIL,MOTRIN) tablet 800 mg (800 mg Oral Given 03/09/16 1343)     Initial Impression / Assessment and Plan / ED Course  I have reviewed the triage vital signs and the nursing  notes.  Pertinent labs & imaging results that were available during my care of the patient were reviewed by me and considered in my medical decision making (see chart for details).  Clinical Course    Patient with dentalgia.  No abscess requiring immediate incision and drainage.  Exam not concerning for Ludwig's angina or pharyngeal abscess.  Will treat with PCN. Pt instructed to follow-up with dentist.  Discussed return precautions. Pt safe for discharge.   Final Clinical Impressions(s) / ED Diagnoses   Final diagnoses:  Dentalgia    New Prescriptions New Prescriptions   IBUPROFEN (ADVIL,MOTRIN) 800 MG TABLET    Take 1 tablet (800 mg total) by mouth 3 (three) times daily.   PENICILLIN V POTASSIUM (VEETID) 500 MG TABLET    Take 1 tablet (500 mg total) by  mouth 3 (three) times daily.    I personally performed the services described in this documentation, which was scribed in my presence. The recorded information has been reviewed and is accurate.    Dahlia ClientHannah Loredana Medellin, PA-C 03/09/16 1345    Jacalyn LefevreJulie Haviland, MD 03/10/16 [id number]

## 2016-03-09 NOTE — ED Notes (Signed)
Pt c/o bilateral facial swelling with dental pain, onset last pm.

## 2016-03-09 NOTE — Discharge Instructions (Signed)
1. Medications: Alternate ibuprofen and Tylenol, penicillin, usual home medications °2. Treatment: rest, drink plenty of fluids, take medications as prescribed °3. Follow Up: Please followup with dentistry within 1 week for discussion of your diagnoses and further evaluation after today's visit; if you do not have a primary care doctor use the resource guide provided to find one; Return to the ER for high fevers, difficulty breathing, difficulty swallowing or other concerning symptoms ° °

## 2016-04-19 ENCOUNTER — Encounter (HOSPITAL_COMMUNITY): Payer: Self-pay

## 2016-04-19 ENCOUNTER — Inpatient Hospital Stay (HOSPITAL_COMMUNITY)
Admission: AD | Admit: 2016-04-19 | Discharge: 2016-04-19 | Disposition: A | Discharge status: Home / Self Care | Payer: Medicaid Other | Source: Ambulatory Visit | Attending: Obstetrics and Gynecology | Admitting: Obstetrics and Gynecology

## 2016-04-19 DIAGNOSIS — Z3202 Encounter for pregnancy test, result negative: Secondary | ICD-10-CM | POA: Insufficient documentation

## 2016-04-19 DIAGNOSIS — R109 Unspecified abdominal pain: Secondary | ICD-10-CM | POA: Insufficient documentation

## 2016-04-19 LAB — CBC
HCT: 34.9 % — ABNORMAL LOW (ref 36.0–46.0)
Hemoglobin: 11.9 g/dL — ABNORMAL LOW (ref 12.0–15.0)
MCH: 27.4 pg (ref 26.0–34.0)
MCHC: 34.1 g/dL (ref 30.0–36.0)
MCV: 80.2 fL (ref 78.0–100.0)
Platelets: 183 10*3/uL (ref 150–400)
RBC: 4.35 MIL/uL (ref 3.87–5.11)
RDW: 14.1 % (ref 11.5–15.5)
WBC: 7.1 10*3/uL (ref 4.0–10.5)

## 2016-04-19 LAB — URINALYSIS, ROUTINE W REFLEX MICROSCOPIC
Glucose, UA: NEGATIVE mg/dL
Ketones, ur: NEGATIVE mg/dL
Leukocytes, UA: NEGATIVE
Nitrite: NEGATIVE
Protein, ur: 30 mg/dL — AB
Specific Gravity, Urine: 1.025 (ref 1.005–1.030)
pH: 6 (ref 5.0–8.0)

## 2016-04-19 LAB — WET PREP, GENITAL
Clue Cells Wet Prep HPF POC: NONE SEEN
Sperm: NONE SEEN
Trich, Wet Prep: NONE SEEN
Yeast Wet Prep HPF POC: NONE SEEN

## 2016-04-19 LAB — URINE MICROSCOPIC-ADD ON

## 2016-04-19 LAB — HCG, QUANTITATIVE, PREGNANCY: hCG, Beta Chain, Quant, S: <1 m[IU]/mL (ref <5)

## 2016-04-19 LAB — POCT PREGNANCY, URINE: Preg Test, Ur: NEGATIVE

## 2016-04-19 MED ORDER — NORGESTIMATE-ETH ESTRADIOL 0.25-35 MG-MCG PO TABS: 1.0000 | ORAL_TABLET | Freq: Every day | ORAL | 3 refills | Status: DC

## 2016-04-19 MED ORDER — KETOROLAC TROMETHAMINE 60 MG/2ML IM SOLN
60.0000 mg | Freq: Once | INTRAMUSCULAR | Status: AC
  Administered 2016-04-19: 60 mg via INTRAMUSCULAR
  Filled 2016-04-19: qty 2

## 2016-04-19 NOTE — MAU Provider Note (Signed)
History   [id number]   Chief Complaint  Patient presents with  . Abdominal Pain    HPI Chloe Rodgers is a 25 y.o. female  [id number] here with report of spotting of blood x 2 days.  Here due to concern for possible pregnancy.  Reports positive test at home.  Denies abnormal odor, +vaginal discharge.  No report of UTI symptoms.  Also reports cramping x one week.  Pain rated a 4/10.     Patient's last menstrual period was 03/21/2016.  OB History  Gravida Para Term Preterm AB Living  3 2 2  0 1 2  SAB TAB Ectopic Multiple Live Births  0 1 0 0 2    # Outcome Date GA Lbr Len/2nd Weight Sex Delivery Anes PTL Lv  3 Term 06/06/12 5583w0d  7 lb 15.9 oz (3.625 kg) F CS-LTranv Spinal  LIV  2 Term 02/25/11 5945w0d  8 lb 14 oz (4.026 kg) F CS-LTranv EPI N LIV     Birth Comments: c/s  due to "BP dropped"  1 TAB              Birth Comments: System Generated. Please review and update pregnancy details.      Past Medical History:  Diagnosis Date  . Anemia   . Anxiety   . Chlamydia   . Complication of anesthesia    ??, seizure with wisdom teeth  . Depression   . Headache(784.0)    otc med prn  . Migraine   . PID (acute pelvic inflammatory disease)   . PONV (postoperative nausea and vomiting)   . Psoriasis   . Seizures [facility](HCC)    [place]  . Urinary tract infection     Family History  Problem Relation Age of Onset  . Hypertension Mother   . Diabetes Mother   . Cancer Mother     BREAST  . Stroke Mother   . Seizures Mother   . Asthma Daughter   . Hypertension Maternal Grandmother   . Diabetes Maternal Grandmother   . Cancer Maternal Grandmother     BONE CANCER  . Other Neg Hx     Social History   Social History  . Marital status: Single    Spouse name: N/A  . Number of children: N/A  . Years of education: N/A   Social History Main Topics  . Smoking status: Never Smoker  . Smokeless tobacco: Never Used  . Alcohol use No     Comment: socially but none with  pregnancy  . Drug use: No     Comment: Last use in 07/2011  . Sexual activity: Yes    Birth control/ protection: None, Condom     Comment: pregnant   Other Topics Concern  . None   Social History Narrative  . None    No Known Allergies  No current facility-administered medications on file prior to encounter.    No current outpatient prescriptions on file prior to encounter.     Review of Systems  Constitutional: Negative for chills and fever.  Gastrointestinal: Positive for constipation and nausea. Negative for diarrhea and vomiting.  Genitourinary: Positive for pelvic pain (cramping) and vaginal bleeding. Negative for difficulty urinating, dyspareunia, dysuria, frequency and vaginal discharge.  Neurological: Negative for headaches.     Physical Exam   Vitals:   04/19/16 1134  BP: 131/88  Pulse: 82  Resp: 16  Temp: 97.9 F (36.6 C)  TempSrc: Oral  Weight: 214 lb 12.8 oz (97.4  kg)  Height: 5\' 6"  (1.676 m)    Physical Exam  Constitutional: She is oriented to person, place, and time. She appears well-developed and well-nourished.  HENT:  Head: Normocephalic.  Neck: Normal range of motion. Neck supple.  Cardiovascular: Normal rate, regular rhythm and normal heart sounds.   Respiratory: Effort normal and breath sounds normal.  GI: Soft. She exhibits no mass. There is tenderness. There is no guarding.  Genitourinary: There is bleeding (negative clots; moderate) in the vagina.  Neurological: She is alert and oriented to person, place, and time. She has normal reflexes.  Skin: Skin is warm and dry.    MAU Course  Procedures  MDM Results for orders placed or performed during the hospital encounter of 04/19/16 (from the past 24 hour(s))  Urinalysis, Routine w reflex microscopic (not at [facility])     Status: Abnormal   Collection Time: 04/19/16 11:37 AM  Result Value Ref Range   Color, Urine YELLOW YELLOW   APPearance HAZY (A) CLEAR   Specific Gravity, Urine 1.025  1.005 - 1.030   pH 6.0 5.0 - 8.0   Glucose, UA NEGATIVE NEGATIVE mg/dL   Hgb urine dipstick LARGE (A) NEGATIVE   Bilirubin Urine SMALL (A) NEGATIVE   Ketones, ur NEGATIVE NEGATIVE mg/dL   Protein, ur 30 (A) NEGATIVE mg/dL   Nitrite NEGATIVE NEGATIVE   Leukocytes, UA NEGATIVE NEGATIVE  Urine microscopic-add on     Status: Abnormal   Collection Time: 04/19/16 11:37 AM  Result Value Ref Range   Squamous Epithelial / LPF 0-5 (A) NONE SEEN   WBC, UA 0-5 0 - 5 WBC/hpf   RBC / HPF 6-30 0 - 5 RBC/hpf   Bacteria, UA FEW (A) NONE SEEN  Pregnancy, urine POC     Status: None   Collection Time: 04/19/16 11:54 AM  Result Value Ref Range   Preg Test, Ur NEGATIVE NEGATIVE  hCG, quantitative, pregnancy     Status: None   Collection Time: 04/19/16 12:13 PM  Result Value Ref Range   hCG, Beta Chain, Quant, S <1 <5 mIU/mL  Wet prep, genital     Status: Abnormal   Collection Time: 04/19/16  1:00 PM  Result Value Ref Range   Yeast Wet Prep HPF POC NONE SEEN NONE SEEN   Trich, Wet Prep NONE SEEN NONE SEEN   Clue Cells Wet Prep HPF POC NONE SEEN NONE SEEN   WBC, Wet Prep HPF POC FEW (A) NONE SEEN   Sperm NONE SEEN   CBC     Status: Abnormal   Collection Time: 04/19/16  1:00 PM  Result Value Ref Range   WBC 7.1 4.0 - 10.5 K/uL   RBC 4.35 3.87 - 5.11 MIL/uL   Hemoglobin 11.9 (L) 12.0 - 15.0 g/dL   HCT 16.1 (L) 09.6 - 04.5 %   MCV 80.2 78.0 - 100.0 fL   MCH 27.4 26.0 - 34.0 pg   MCHC 34.1 30.0 - 36.0 g/dL   RDW 40.9 81.1 - 91.4 %   Platelets 183 150 - 400 K/uL    Report given to J. Rayan Ines who assumes care of patient Marlis Edelson, CNM  CBC Quant >1, likely menstrual cycle as a cause of the bleeding.  Assessment and Plan    A:  1. Abdominal cramping   2. Encounter for pregnancy test with result negative     P:  Discharge home in stable condition Ok to take ibuprofen as needed, as directed on the bottle  Return  to MAU if symptoms worsen Likely bleeding is menstrual bleeding.   Bleeding precautions   Duane Lope, NP 04/19/2016 4:02 PM

## 2016-04-19 NOTE — MAU Note (Signed)
Pt presents to MAU with complaints of vaginal spotting since yesterday with mild abdominal cramping. LMP 03/21/16 and irregular. + HPT

## 2016-04-19 NOTE — Discharge Instructions (Signed)

## 2016-04-20 LAB — GC/CHLAMYDIA PROBE AMP (~~LOC~~) NOT AT ARMC
Chlamydia: NEGATIVE
Neisseria Gonorrhea: NEGATIVE

## 2016-04-25 ENCOUNTER — Encounter (HOSPITAL_COMMUNITY): Payer: Self-pay | Admitting: *Deleted

## 2016-04-25 ENCOUNTER — Ambulatory Visit (HOSPITAL_COMMUNITY)
Admission: EM | Admit: 2016-04-25 | Discharge: 2016-04-25 | Disposition: A | Discharge status: Home / Self Care | Payer: Medicaid Other | Attending: Internal Medicine | Admitting: Internal Medicine

## 2016-04-25 DIAGNOSIS — L259 Unspecified contact dermatitis, unspecified cause: Secondary | ICD-10-CM | POA: Diagnosis not present

## 2016-04-25 DIAGNOSIS — L509 Urticaria, unspecified: Secondary | ICD-10-CM | POA: Diagnosis not present

## 2016-04-25 DIAGNOSIS — L309 Dermatitis, unspecified: Secondary | ICD-10-CM | POA: Diagnosis not present

## 2016-04-25 MED ORDER — HYDROXYZINE HCL 25 MG PO TABS: 25.0000 mg | ORAL_TABLET | Freq: Four times a day (QID) | ORAL | 0 refills | Status: DC

## 2016-04-25 MED ORDER — TRIAMCINOLONE ACETONIDE 0.1 % EX CREA: 1.0000 "application " | TOPICAL_CREAM | Freq: Two times a day (BID) | CUTANEOUS | 0 refills | Status: DC

## 2016-04-25 NOTE — ED Triage Notes (Signed)
Pt  Reports   Symptoms  Of a  Rash   X   3  Days   Face  Scalp  And  Neck            denys  Any  New    meds        Or  Any  Any  No   Causative  Agents    Appearing in no  Acute  Distress

## 2016-04-25 NOTE — Discharge Instructions (Signed)
Avoid the face with the steroid cream it can can cause hyperpigmentation.  May use neosporin.  Hydroxyzine may make you tired do not drive with this.  Will need to follow up with dermatology.

## 2016-04-25 NOTE — ED Provider Notes (Signed)
CSN: [id number]     Arrival date & time 04/25/16  1419 History   First MD Initiated Contact with Patient 04/25/16 1550     Chief Complaint  Patient presents with  . Rash   (Consider location/radiation/quality/duration/timing/severity/associated sxs/prior Treatment) Pt has a hx of eczema and chorisis. She has not been treated for these in years. Has itching, thickness of rash to neck, behind ears, and face. Has tried several lotions but it is getting worse. Denies any fevers.  Denies any new soaps, no lotions, no detergents.       Past Medical History:  Diagnosis Date  . Anemia   . Anxiety   . Chlamydia   . Complication of anesthesia    ??, seizure with wisdom teeth  . Depression   . Headache(784.0)    otc med prn  . Migraine   . PID (acute pelvic inflammatory disease)   . PONV (postoperative nausea and vomiting)   . Psoriasis   . Seizures [facility](HCC)    [place]  . Urinary tract infection    Past Surgical History:  Procedure Laterality Date  . CESAREAN SECTION    . CESAREAN SECTION  06/06/2012   Procedure: CESAREAN SECTION;  Surgeon: Adam PhenixJames G Arnold, MD;  Location: [facility] ORS;  Service: Obstetrics;  Laterality: N/A;  . SKIN GRAFT     off abd, onto arm  . WISDOM TOOTH EXTRACTION     Family History  Problem Relation Age of Onset  . Hypertension Mother   . Diabetes Mother   . Cancer Mother     BREAST  . Stroke Mother   . Seizures Mother   . Asthma Daughter   . Hypertension Maternal Grandmother   . Diabetes Maternal Grandmother   . Cancer Maternal Grandmother     BONE CANCER  . Other Neg Hx    Social History  Substance Use Topics  . Smoking status: Never Smoker  . Smokeless tobacco: Never Used  . Alcohol use No     Comment: socially but none with pregnancy   OB History    Gravida Para Term Preterm AB Living   3 2 2  0 1 2   SAB TAB Ectopic Multiple Live Births   0 1 0 0 2     Review of Systems  Constitutional: Negative.   Respiratory: Negative.    Cardiovascular: Negative.   Skin: Positive for rash.       Thick raised area to neck, behind ears and areas on the face. Itching     Allergies  Review of patient's allergies indicates no known allergies.  Home Medications   Prior to Admission medications   Medication Sig Start Date End Date Taking? Authorizing Provider  hydrOXYzine (ATARAX/VISTARIL) 25 MG tablet Take 1 tablet (25 mg total) by mouth every 6 (six) hours. 04/25/16   Tobi BastosMelanie A Jodell Weitman, NP  norgestimate-ethinyl estradiol (ORTHO-CYCLEN,SPRINTEC,PREVIFEM) 0.25-35 MG-MCG tablet Take 1 tablet by mouth daily. 04/19/16   Duane LopeJennifer I Rasch, NP  triamcinolone cream (KENALOG) 0.1 % Apply 1 application topically 2 (two) times daily. 04/25/16   Tobi BastosMelanie A Shelah Heatley, NP   Meds Ordered and Administered this Visit  Medications - No data to display  BP 138/86 (BP Location: Right Arm)   Pulse 78   Temp 98.6 F (37 C) (Oral)   Resp 18   LMP 04/22/2016   SpO2 99%  No data found.   Physical Exam  Constitutional: She is oriented to person, place, and time. She appears well-developed and  well-nourished.  HENT:  Head: Normocephalic.  Eyes: Pupils are equal, round, and reactive to light.  Neck: Normal range of motion.  Cardiovascular: Normal rate and regular rhythm.   Pulmonary/Chest: Effort normal and breath sounds normal.  Neurological: She is oriented to person, place, and time.  Skin: Capillary refill takes less than 2 seconds. Rash noted.  Thick, patches of areas to behind bil ears, neck and some on the face. No pustula's,      Urgent Care Course   Clinical Course    Procedures (including critical care time)  Labs Review Labs Reviewed - No data to display  Imaging Review No results found.          MDM   1. Contact dermatitis, unspecified contact dermatitis type, unspecified trigger   2. Urticaria   3. Eczema, unspecified type    Avoid the face with the steroid cream it can can cause hyperpigmentation.   May use neosporin.  Hydroxyzine may make you tired do not drive with this.  Will need to follow up with dermatology.     Tobi Bastos, NP 04/25/16 [phone]

## 2016-04-26 ENCOUNTER — Encounter: Payer: Self-pay | Admitting: *Deleted

## 2016-04-26 ENCOUNTER — Ambulatory Visit: Payer: Medicaid Other | Admitting: Family Medicine

## 2016-04-26 NOTE — Progress Notes (Signed)
Chloe Rodgers missed her scheduled appt. Will send letter.

## 2016-05-02 ENCOUNTER — Emergency Department (HOSPITAL_COMMUNITY): Payer: Medicaid Other

## 2016-05-02 ENCOUNTER — Emergency Department (HOSPITAL_COMMUNITY)
Admission: EM | Admit: 2016-05-02 | Discharge: 2016-05-02 | Disposition: A | Discharge status: Home / Self Care | Payer: Medicaid Other | Attending: Emergency Medicine | Admitting: Emergency Medicine

## 2016-05-02 ENCOUNTER — Encounter (HOSPITAL_COMMUNITY): Payer: Self-pay | Admitting: Emergency Medicine

## 2016-05-02 DIAGNOSIS — S022XXA Fracture of nasal bones, initial encounter for closed fracture: Secondary | ICD-10-CM | POA: Insufficient documentation

## 2016-05-02 DIAGNOSIS — Y929 Unspecified place or not applicable: Secondary | ICD-10-CM | POA: Insufficient documentation

## 2016-05-02 DIAGNOSIS — Y999 Unspecified external cause status: Secondary | ICD-10-CM | POA: Insufficient documentation

## 2016-05-02 DIAGNOSIS — M25561 Pain in right knee: Secondary | ICD-10-CM | POA: Diagnosis not present

## 2016-05-02 DIAGNOSIS — S0992XA Unspecified injury of nose, initial encounter: Secondary | ICD-10-CM | POA: Diagnosis present

## 2016-05-02 DIAGNOSIS — R51 Headache: Secondary | ICD-10-CM | POA: Insufficient documentation

## 2016-05-02 DIAGNOSIS — Y9301 Activity, walking, marching and hiking: Secondary | ICD-10-CM | POA: Insufficient documentation

## 2016-05-02 DIAGNOSIS — M25531 Pain in right wrist: Secondary | ICD-10-CM | POA: Diagnosis not present

## 2016-05-02 LAB — I-STAT BETA HCG BLOOD, ED (MC, WL, AP ONLY): I-stat hCG, quantitative: <5 m[IU]/mL (ref <5)

## 2016-05-02 MED ORDER — HYDROCODONE-ACETAMINOPHEN 5-325 MG PO TABS: 1.0000 | ORAL_TABLET | Freq: Four times a day (QID) | ORAL | 0 refills | Status: DC | PRN

## 2016-05-02 MED ORDER — KETOROLAC TROMETHAMINE 60 MG/2ML IM SOLN
60.0000 mg | Freq: Once | INTRAMUSCULAR | Status: AC
  Administered 2016-05-02: 60 mg via INTRAMUSCULAR
  Filled 2016-05-02: qty 2

## 2016-05-02 MED ORDER — HYDROCODONE-ACETAMINOPHEN 5-325 MG PO TABS
1.0000 | ORAL_TABLET | Freq: Once | ORAL | Status: AC
  Administered 2016-05-02: 1 via ORAL
  Filled 2016-05-02: qty 1

## 2016-05-02 MED ORDER — IBUPROFEN 400 MG PO TABS: 400.0000 mg | ORAL_TABLET | Freq: Four times a day (QID) | ORAL | 0 refills | Status: DC | PRN

## 2016-05-02 NOTE — ED Notes (Signed)
Patient transported to X-ray 

## 2016-05-02 NOTE — ED Triage Notes (Signed)
Patient in c-collar from EMS
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
## 2016-07-25 ENCOUNTER — Encounter (HOSPITAL_COMMUNITY): Payer: Self-pay | Admitting: *Deleted

## 2016-07-25 ENCOUNTER — Emergency Department (HOSPITAL_COMMUNITY)
Admission: EM | Admit: 2016-07-25 | Discharge: 2016-07-25 | Disposition: A | Discharge status: Home / Self Care | Payer: Medicaid Other | Attending: Emergency Medicine | Admitting: Emergency Medicine

## 2016-07-25 ENCOUNTER — Ambulatory Visit (HOSPITAL_COMMUNITY)
Admission: EM | Admit: 2016-07-25 | Discharge: 2016-07-25 | Discharge status: Absent without leave | Payer: Medicaid Other

## 2016-07-25 DIAGNOSIS — M549 Dorsalgia, unspecified: Secondary | ICD-10-CM | POA: Diagnosis not present

## 2016-07-25 DIAGNOSIS — Z79899 Other long term (current) drug therapy: Secondary | ICD-10-CM | POA: Diagnosis not present

## 2016-07-25 DIAGNOSIS — R109 Unspecified abdominal pain: Secondary | ICD-10-CM | POA: Diagnosis present

## 2016-07-25 DIAGNOSIS — Z5321 Procedure and treatment not carried out due to patient leaving prior to being seen by health care provider: Secondary | ICD-10-CM | POA: Insufficient documentation

## 2016-07-25 LAB — URINALYSIS, ROUTINE W REFLEX MICROSCOPIC
Bilirubin Urine: NEGATIVE
Glucose, UA: NEGATIVE mg/dL
Hgb urine dipstick: NEGATIVE
Ketones, ur: NEGATIVE mg/dL
Leukocytes, UA: NEGATIVE
Nitrite: NEGATIVE
Protein, ur: NEGATIVE mg/dL
Specific Gravity, Urine: 1.023 (ref 1.005–1.030)
pH: 6 (ref 5.0–8.0)

## 2016-07-25 NOTE — ED Triage Notes (Signed)
Pt reports bilateral lower back pain and lower abd pian. Has burning pain with urination and vaginal discharge.

## 2016-07-25 NOTE — ED Notes (Signed)
Pt called for room x3. No answer. 

## 2016-07-25 NOTE — ED Triage Notes (Signed)
Called patient multiple times without response.

## 2016-08-02 ENCOUNTER — Emergency Department (HOSPITAL_COMMUNITY)
Admission: EM | Admit: 2016-08-02 | Discharge: 2016-08-02 | Disposition: A | Discharge status: Home / Self Care | Payer: Medicaid Other | Attending: Emergency Medicine | Admitting: Emergency Medicine

## 2016-08-02 ENCOUNTER — Encounter (HOSPITAL_COMMUNITY): Payer: Self-pay | Admitting: Emergency Medicine

## 2016-08-02 ENCOUNTER — Emergency Department (HOSPITAL_COMMUNITY): Payer: Medicaid Other

## 2016-08-02 DIAGNOSIS — Y9389 Activity, other specified: Secondary | ICD-10-CM | POA: Diagnosis not present

## 2016-08-02 DIAGNOSIS — Y999 Unspecified external cause status: Secondary | ICD-10-CM | POA: Diagnosis not present

## 2016-08-02 DIAGNOSIS — Z3201 Encounter for pregnancy test, result positive: Secondary | ICD-10-CM | POA: Diagnosis not present

## 2016-08-02 DIAGNOSIS — T148XXA Other injury of unspecified body region, initial encounter: Secondary | ICD-10-CM

## 2016-08-02 DIAGNOSIS — Y929 Unspecified place or not applicable: Secondary | ICD-10-CM | POA: Diagnosis not present

## 2016-08-02 DIAGNOSIS — S1091XA Abrasion of unspecified part of neck, initial encounter: Secondary | ICD-10-CM | POA: Insufficient documentation

## 2016-08-02 DIAGNOSIS — S0083XA Contusion of other part of head, initial encounter: Secondary | ICD-10-CM | POA: Diagnosis not present

## 2016-08-02 DIAGNOSIS — S0993XA Unspecified injury of face, initial encounter: Secondary | ICD-10-CM | POA: Diagnosis present

## 2016-08-02 LAB — POC URINE PREG, ED: Preg Test, Ur: POSITIVE — AB

## 2016-08-02 MED ORDER — PRENATAL COMPLETE 14-0.4 MG PO TABS: 1.0000 | ORAL_TABLET | Freq: Every day | ORAL | 0 refills | Status: DC
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
## 2016-08-02 NOTE — Discharge Instructions (Signed)
Ice or heat to affected area as needed for pain.  Tylenol as needed for additional pain relief.  Please start taking pre-natal vitamin daily.  Please follow-up with the women's outpatient clinic listed for further discussion of her pregnancy. Please follow-up with the ENT physician if jaw pain and facial swelling does not improve in 1 week. Return to ER for any new or worsening symptoms, any additional concerns.
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
## 2016-08-19 ENCOUNTER — Encounter (HOSPITAL_COMMUNITY): Payer: Self-pay | Admitting: *Deleted

## 2016-08-19 ENCOUNTER — Inpatient Hospital Stay (HOSPITAL_COMMUNITY): Payer: Medicaid Other

## 2016-08-19 ENCOUNTER — Inpatient Hospital Stay (HOSPITAL_COMMUNITY)
Admission: AD | Admit: 2016-08-19 | Discharge: 2016-08-19 | Disposition: A | Discharge status: Home / Self Care | Payer: Medicaid Other | Source: Ambulatory Visit | Attending: Obstetrics and Gynecology | Admitting: Obstetrics and Gynecology

## 2016-08-19 DIAGNOSIS — Z8249 Family history of ischemic heart disease and other diseases of the circulatory system: Secondary | ICD-10-CM | POA: Diagnosis not present

## 2016-08-19 DIAGNOSIS — O208 Other hemorrhage in early pregnancy: Secondary | ICD-10-CM | POA: Insufficient documentation

## 2016-08-19 DIAGNOSIS — Z3A01 Less than 8 weeks gestation of pregnancy: Secondary | ICD-10-CM | POA: Diagnosis not present

## 2016-08-19 DIAGNOSIS — O219 Vomiting of pregnancy, unspecified: Secondary | ICD-10-CM

## 2016-08-19 DIAGNOSIS — Z823 Family history of stroke: Secondary | ICD-10-CM | POA: Insufficient documentation

## 2016-08-19 DIAGNOSIS — R109 Unspecified abdominal pain: Secondary | ICD-10-CM | POA: Diagnosis present

## 2016-08-19 DIAGNOSIS — O26851 Spotting complicating pregnancy, first trimester: Secondary | ICD-10-CM

## 2016-08-19 DIAGNOSIS — Z803 Family history of malignant neoplasm of breast: Secondary | ICD-10-CM | POA: Insufficient documentation

## 2016-08-19 DIAGNOSIS — O26891 Other specified pregnancy related conditions, first trimester: Secondary | ICD-10-CM | POA: Diagnosis not present

## 2016-08-19 DIAGNOSIS — O34219 Maternal care for unspecified type scar from previous cesarean delivery: Secondary | ICD-10-CM | POA: Insufficient documentation

## 2016-08-19 DIAGNOSIS — O99351 Diseases of the nervous system complicating pregnancy, first trimester: Secondary | ICD-10-CM | POA: Diagnosis not present

## 2016-08-19 DIAGNOSIS — G40909 Epilepsy, unspecified, not intractable, without status epilepticus: Secondary | ICD-10-CM | POA: Insufficient documentation

## 2016-08-19 DIAGNOSIS — Z833 Family history of diabetes mellitus: Secondary | ICD-10-CM | POA: Diagnosis not present

## 2016-08-19 DIAGNOSIS — Z8619 Personal history of other infectious and parasitic diseases: Secondary | ICD-10-CM | POA: Diagnosis not present

## 2016-08-19 DIAGNOSIS — Z825 Family history of asthma and other chronic lower respiratory diseases: Secondary | ICD-10-CM | POA: Insufficient documentation

## 2016-08-19 DIAGNOSIS — O200 Threatened abortion: Secondary | ICD-10-CM

## 2016-08-19 LAB — URINALYSIS, ROUTINE W REFLEX MICROSCOPIC
Bilirubin Urine: NEGATIVE
Glucose, UA: NEGATIVE mg/dL
Hgb urine dipstick: NEGATIVE
Ketones, ur: NEGATIVE mg/dL
Nitrite: NEGATIVE
Protein, ur: NEGATIVE mg/dL
Specific Gravity, Urine: 1.02 (ref 1.005–1.030)
pH: 7 (ref 5.0–8.0)

## 2016-08-19 LAB — HCG, QUANTITATIVE, PREGNANCY: hCG, Beta Chain, Quant, S: 63090 m[IU]/mL — ABNORMAL HIGH (ref <5)

## 2016-08-19 LAB — WET PREP, GENITAL
Clue Cells Wet Prep HPF POC: NONE SEEN
Sperm: NONE SEEN
Trich, Wet Prep: NONE SEEN
Yeast Wet Prep HPF POC: NONE SEEN

## 2016-08-19 LAB — CBC
HCT: 32.6 % — ABNORMAL LOW (ref 36.0–46.0)
Hemoglobin: 11.1 g/dL — ABNORMAL LOW (ref 12.0–15.0)
MCH: 27.6 pg (ref 26.0–34.0)
MCHC: 34 g/dL (ref 30.0–36.0)
MCV: 81.1 fL (ref 78.0–100.0)
Platelets: 161 10*3/uL (ref 150–400)
RBC: 4.02 MIL/uL (ref 3.87–5.11)
RDW: 14.3 % (ref 11.5–15.5)
WBC: 8.9 10*3/uL (ref 4.0–10.5)

## 2016-08-19 MED ORDER — PROMETHAZINE HCL 25 MG PO TABS: 12.5000 mg | ORAL_TABLET | Freq: Four times a day (QID) | ORAL | 3 refills | Status: DC | PRN

## 2016-08-19 MED ORDER — PROMETHAZINE HCL 25 MG/ML IJ SOLN
12.5000 mg | Freq: Once | INTRAMUSCULAR | Status: AC
  Administered 2016-08-19: 12.5 mg via INTRAVENOUS
  Filled 2016-08-19: qty 1

## 2016-08-19 MED ORDER — LACTATED RINGERS IV BOLUS (SEPSIS)
1000.0000 mL | Freq: Once | INTRAVENOUS | Status: AC
  Administered 2016-08-19: 1000 mL via INTRAVENOUS
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
## 2016-08-19 NOTE — Discharge Instructions (Signed)
Morning Sickness °Morning sickness is when you feel sick to your stomach (nauseous) during pregnancy. This nauseous feeling may or may not come with vomiting. It often occurs in the morning but can be a problem any time of day. Morning sickness is most common during the first trimester, but it may continue throughout pregnancy. While morning sickness is unpleasant, it is usually harmless unless you develop severe and continual vomiting (hyperemesis gravidarum). This condition requires more intense treatment. °What are the causes? °The cause of morning sickness is not completely known but seems to be related to normal hormonal changes that occur in pregnancy. °What increases the risk? °You are at greater risk if you: °· Experienced nausea or vomiting before your pregnancy. °· Had morning sickness during a previous pregnancy. °· Are pregnant with more than one baby, such as twins. ° °How is this treated? °Do not use any medicines (prescription, over-the-counter, or herbal) for morning sickness without first talking to your health care provider. Your health care provider may prescribe or recommend: °· Vitamin B6 supplements. °· Anti-nausea medicines. °· The herbal medicine ginger. ° °Follow these instructions at home: °· Only take over-the-counter or prescription medicines as directed by your health care provider. °· Taking multivitamins before getting pregnant can prevent or decrease the severity of morning sickness in most women. °· Eat a piece of dry toast or unsalted crackers before getting out of bed in the morning. °· Eat five or six small meals a day. °· Eat dry and bland foods (rice, baked potato). Foods high in carbohydrates are often helpful. °· Do not drink liquids with your meals. Drink liquids between meals. °· Avoid greasy, fatty, and spicy foods. °· Get someone to cook for you if the smell of any food causes nausea and vomiting. °· If you feel nauseous after taking prenatal vitamins, take the vitamins at  night or with a snack. °· Snack on protein foods (nuts, yogurt, cheese) between meals if you are hungry. °· Eat unsweetened gelatins for desserts. °· Wearing an acupressure wristband (worn for sea sickness) may be helpful. °· Acupuncture may be helpful. °· Do not smoke. °· Get a humidifier to keep the air in your house free of odors. °· Get plenty of fresh air. °Contact a health care provider if: °· Your home remedies are not working, and you need medicine. °· You feel dizzy or lightheaded. °· You are losing weight. °Get help right away if: °· You have persistent and uncontrolled nausea and vomiting. °· You pass out (faint). °This information is not intended to replace advice given to you by your health care provider. Make sure you discuss any questions you have with your health care provider. °Document Released: 08/19/2006 Document Revised: 12/04/2015 Document Reviewed: 12/13/2012 °Elsevier Interactive Patient Education © 2017 [employer] ° °

## 2016-08-19 NOTE — MAU Note (Signed)
Pt reports she was treated for chlamydia last week. Stated she  is having increased abd pain and cramping.

## 2016-08-20 LAB — GC/CHLAMYDIA PROBE AMP (~~LOC~~) NOT AT ARMC
Chlamydia: POSITIVE — AB
Neisseria Gonorrhea: NEGATIVE

## 2016-08-23 ENCOUNTER — Other Ambulatory Visit: Payer: Self-pay | Admitting: Student

## 2016-08-23 ENCOUNTER — Telehealth (HOSPITAL_COMMUNITY): Payer: Self-pay

## 2016-08-23 DIAGNOSIS — O98811 Other maternal infectious and parasitic diseases complicating pregnancy, first trimester: Principal | ICD-10-CM

## 2016-08-23 DIAGNOSIS — A749 Chlamydial infection, unspecified: Secondary | ICD-10-CM | POA: Insufficient documentation

## 2016-08-23 MED ORDER — AZITHROMYCIN 250 MG PO TABS: 1000.0000 mg | ORAL_TABLET | Freq: Once | ORAL | 0 refills | Status: AC

## 2016-08-23 NOTE — Telephone Encounter (Signed)
Called patient, and verified DOB. Pt aware her lab results during her last visit to [facility] showed she was positive for Chlamydia. Verified pharmacy with patient. Pt aware a prescription (Azithromycin 1 gram x 1 dose) will be called into her pharmacy for treatment of her positive results. She will take the pills as directed. Patient aware once treatment is received, abstain from intercourse x 2 weeks. Also, she is aware to inform her partner because they will also need to be treated. Communicable Disease Report faxed to health department. 

## 2016-09-15 ENCOUNTER — Ambulatory Visit (INDEPENDENT_AMBULATORY_CARE_PROVIDER_SITE_OTHER): Payer: Medicaid Other | Admitting: Obstetrics and Gynecology

## 2016-09-15 ENCOUNTER — Other Ambulatory Visit (HOSPITAL_COMMUNITY)
Admission: RE | Admit: 2016-09-15 | Discharge: 2016-09-15 | Disposition: A | Discharge status: Home / Self Care | Payer: Medicaid Other | Source: Ambulatory Visit | Attending: Obstetrics and Gynecology | Admitting: Obstetrics and Gynecology

## 2016-09-15 ENCOUNTER — Encounter: Payer: Self-pay | Admitting: Obstetrics and Gynecology

## 2016-09-15 VITALS — BP 123/67 | HR 84 | Wt 229.4 lb

## 2016-09-15 DIAGNOSIS — O99341 Other mental disorders complicating pregnancy, first trimester: Secondary | ICD-10-CM | POA: Diagnosis not present

## 2016-09-15 DIAGNOSIS — Z3A10 10 weeks gestation of pregnancy: Secondary | ICD-10-CM | POA: Diagnosis not present

## 2016-09-15 DIAGNOSIS — O0992 Supervision of high risk pregnancy, unspecified, second trimester: Secondary | ICD-10-CM

## 2016-09-15 DIAGNOSIS — F329 Major depressive disorder, single episode, unspecified: Secondary | ICD-10-CM | POA: Diagnosis not present

## 2016-09-15 DIAGNOSIS — Z113 Encounter for screening for infections with a predominantly sexual mode of transmission: Secondary | ICD-10-CM

## 2016-09-15 DIAGNOSIS — G40909 Epilepsy, unspecified, not intractable, without status epilepticus: Secondary | ICD-10-CM | POA: Diagnosis not present

## 2016-09-15 DIAGNOSIS — O99351 Diseases of the nervous system complicating pregnancy, first trimester: Secondary | ICD-10-CM | POA: Diagnosis not present

## 2016-09-15 DIAGNOSIS — O0971 Supervision of high risk pregnancy due to social problems, first trimester: Secondary | ICD-10-CM | POA: Diagnosis present

## 2016-09-15 DIAGNOSIS — Z3482 Encounter for supervision of other normal pregnancy, second trimester: Secondary | ICD-10-CM

## 2016-09-15 MED ORDER — SERTRALINE HCL 50 MG PO TABS: ORAL_TABLET | ORAL | 2 refills | Status: DC

## 2016-09-15 NOTE — Patient Instructions (Signed)
Chlamydia, Female Chlamydia is an STD (sexually transmitted disease). It is a bacterial infection that spreads (is contagious) through sexual contact. Chlamydia can occur in different areas of the body, including:  The tube that moves urine from the bladder out of the body (urethra).  The lower part of the uterus (cervix).  The throat.  The rectum. This condition is not difficult to treat. However, if left untreated, chlamydia can lead to more serious health problems, including pelvic inflammatory disorder (PID). PID can increase your risk of not being able to have children (sterility). What are the causes? Chlamydia is caused by the bacteria Chlamydia trachomatis. It is passed from an infected partner during sexual activity. Chlamydia can spread through contact with the genitals, mouth, or rectum. What are the signs or symptoms? In some cases, there may not be any symptoms for this condition (asymptomatic), especially early in the infection. If symptoms develop, they may include:  Burning with urination.  Frequent urination.  Vaginal discharge.  Redness, soreness, and swelling (inflammation) of the rectum.  Bleeding or discharge from the rectum.  Abdominal pain.  Pain during sexual intercourse.  Bleeding between menstrual periods.  Itching, burning, or redness in the eyes, or discharge from the eyes. How is this diagnosed? This condition may be diagnosed with:  Urine tests.  Swab tests. Depending on your symptoms, your health care provider may use a cotton swab to collect discharge from your vagina or rectum to test for the bacteria.  A pelvic exam. How is this treated? This condition is treated with antibiotic medicines. If you are pregnant, certain types of antibiotics will need to be avoided. Follow these instructions at home: Medicines   Take over-the-counter and prescription medicines only as told by your health care provider.  Take your antibiotic medicine as  told by your health care provider. Do not stop taking the antibiotic even if you start to feel better. Sexual activity   Tell sexual partners about your infection. This includes any oral, anal, or vaginal sex partners you have had within 60 days of when your symptoms started. Sexual partners should also be treated, even if they have no signs of the disease.  Do not have sex until you and your sexual partners have completed treatment and your health care provider says it is okay. If your health care provider prescribed you a single dose treatment, wait 7 days after taking the treatment before having sex. General instructions   It is your responsibility to get your test results. Ask your health care provider, or the department performing the test, when your results will be ready.  Get plenty of rest.  Eat a healthy, well-balanced diet.  Drink enough fluids to keep your urine clear or pale yellow.  Keep all follow-up visits as told by your health care provider. This is important. You may need to be tested for infection again 3 months after treatment. How is this prevented? The only sure way to prevent chlamydia is to avoid having sex. However, you can lower your risk by:  Using latex condoms correctly every time you have sex.  Not having multiple sexual partners.  Asking if your sexual partner has been tested for STIs and had negative results. Contact a health care provider if:  You develop new symptoms or your symptoms do not get better after completing treatment.  You have a fever or chills.  You have pain during sexual intercourse. Get help right away if:  Your pain gets worse and does not  get better with medicine.  You develop flu-like symptoms, such as night sweats, sore throat, or muscle aches.  You experience nausea or vomiting.  You have difficulty swallowing.  You have bleeding between periods or after sex.  You have irregular menstrual periods.  You have abdominal  or lower back pain that does not get better with medicine.  You feel weak or dizzy, or you faint.  You are pregnant and you develop symptoms of chlamydia. Summary  Chlamydia is an STD (sexually transmitted disease). It is a bacterial infection that spreads (is contagious) through sexual contact.  This condition is not difficult to treat, however. If left untreated, chlamydia can lead to more serious health problems, including pelvic inflammatory disease (PID).  In some cases, there may not be any symptoms for this condition (asymptomatic).  This condition is treated with antibiotic medicines.  Using latex condoms correctly every time you have sex can help prevent chlamydia. This information is not intended to replace advice given to you by your health care provider. Make sure you discuss any questions you have with your health care provider. Document Released: 04/07/2005 Document Revised: 06/14/2016 Document Reviewed: 06/14/2016 Elsevier Interactive Patient Education  2017 [employer] First Trimester of Pregnancy The first trimester of pregnancy is from week 1 until the end of week 13 (months 1 through 3). A week after a sperm fertilizes an egg, the egg will implant on the wall of the uterus. This embryo will begin to develop into a baby. Genes from you and your partner will form the baby. The female genes will determine whether the baby will be a boy or a girl. At 6-8 weeks, the eyes and face will be formed, and the heartbeat can be seen on ultrasound. At the end of 12 weeks, all the baby's organs will be formed. Now that you are pregnant, you will want to do everything you can to have a healthy baby. Two of the most important things are to get good prenatal care and to follow your health care provider's instructions. Prenatal care is all the medical care you receive before the baby's birth. This care will help prevent, find, and treat any problems during the pregnancy and childbirth. Body  changes during your first trimester Your body goes through many changes during pregnancy. The changes vary from woman to woman.  You may gain or lose a couple of pounds at first.  You may feel sick to your stomach (nauseous) and you may throw up (vomit). If the vomiting is uncontrollable, call your health care provider.  You may tire easily.  You may develop headaches that can be relieved by medicines. All medicines should be approved by your health care provider.  You may urinate more often. Painful urination may mean you have a bladder infection.  You may develop heartburn as a result of your pregnancy.  You may develop constipation because certain hormones are causing the muscles that push stool through your intestines to slow down.  You may develop hemorrhoids or swollen veins (varicose veins).  Your breasts may begin to grow larger and become tender. Your nipples may stick out more, and the tissue that surrounds them (areola) may become darker.  Your gums may bleed and may be sensitive to brushing and flossing.  Dark spots or blotches (chloasma, mask of pregnancy) may develop on your face. This will likely fade after the baby is born.  Your menstrual periods will stop.  You may have a loss of appetite.  You  may develop cravings for certain kinds of food.  You may have changes in your emotions from day to day, such as being excited to be pregnant or being concerned that something may go wrong with the pregnancy and baby.  You may have more vivid and strange dreams.  You may have changes in your hair. These can include thickening of your hair, rapid growth, and changes in texture. Some women also have hair loss during or after pregnancy, or hair that feels dry or thin. Your hair will most likely return to normal after your baby is born. What to expect at prenatal visits During a routine prenatal visit:  You will be weighed to make sure you and the baby are growing  normally.  Your blood pressure will be taken.  Your abdomen will be measured to track your baby's growth.  The fetal heartbeat will be listened to between weeks 10 and 14 of your pregnancy.  Test results from any previous visits will be discussed. Your health care provider may ask you:  How you are feeling.  If you are feeling the baby move.  If you have had any abnormal symptoms, such as leaking fluid, bleeding, severe headaches, or abdominal cramping.  If you are using any tobacco products, including cigarettes, chewing tobacco, and electronic cigarettes.  If you have any questions. Other tests that may be performed during your first trimester include:  Blood tests to find your blood type and to check for the presence of any previous infections. The tests will also be used to check for low iron levels (anemia) and protein on red blood cells (Rh antibodies). Depending on your risk factors, or if you previously had diabetes during pregnancy, you may have tests to check for high blood sugar that affects pregnant women (gestational diabetes).  Urine tests to check for infections, diabetes, or protein in the urine.  An ultrasound to confirm the proper growth and development of the baby.  Fetal screens for spinal cord problems (spina bifida) and Down syndrome.  HIV (human immunodeficiency virus) testing. Routine prenatal testing includes screening for HIV, unless you choose not to have this test.  You may need other tests to make sure you and the baby are doing well. Follow these instructions at home: Medicines   Follow your health care provider's instructions regarding medicine use. Specific medicines may be either safe or unsafe to take during pregnancy.  Take a prenatal vitamin that contains at least 600 micrograms (mcg) of folic acid.  If you develop constipation, try taking a stool softener if your health care provider approves. Eating and drinking   Eat a balanced diet  that includes fresh fruits and vegetables, whole grains, good sources of protein such as meat, eggs, or tofu, and low-fat dairy. Your health care provider will help you determine the amount of weight gain that is right for you.  Avoid raw meat and uncooked cheese. These carry germs that can cause birth defects in the baby.  Eating four or five small meals rather than three large meals a day may help relieve nausea and vomiting. If you start to feel nauseous, eating a few soda crackers can be helpful. Drinking liquids between meals, instead of during meals, also seems to help ease nausea and vomiting.  Limit foods that are high in fat and processed sugars, such as fried and sweet foods.  To prevent constipation:  Eat foods that are high in fiber, such as fresh fruits and vegetables, whole grains, and beans.  Drink enough fluid to keep your urine clear or pale yellow. Activity   Exercise only as directed by your health care provider. Most women can continue their usual exercise routine during pregnancy. Try to exercise for 30 minutes at least 5 days a week. Exercising will help you:  Control your weight.  Stay in shape.  Be prepared for labor and delivery.  Experiencing pain or cramping in the lower abdomen or lower back is a good sign that you should stop exercising. Check with your health care provider before continuing with normal exercises.  Try to avoid standing for long periods of time. Move your legs often if you must stand in one place for a long time.  Avoid heavy lifting.  Wear low-heeled shoes and practice good posture.  You may continue to have sex unless your health care provider tells you not to. Relieving pain and discomfort   Wear a good support bra to relieve breast tenderness.  Take warm sitz baths to soothe any pain or discomfort caused by hemorrhoids. Use hemorrhoid cream if your health care provider approves.  Rest with your legs elevated if you have leg  cramps or low back pain.  If you develop varicose veins in your legs, wear support hose. Elevate your feet for 15 minutes, 3-4 times a day. Limit salt in your diet. Prenatal care   Schedule your prenatal visits by the twelfth week of pregnancy. They are usually scheduled monthly at first, then more often in the last 2 months before delivery.  Write down your questions. Take them to your prenatal visits.  Keep all your prenatal visits as told by your health care provider. This is important. Safety   Wear your seat belt at all times when driving.  Make a list of emergency phone numbers, including numbers for family, friends, the hospital, and police and fire departments. General instructions   Ask your health care provider for a referral to a local prenatal education class. Begin classes no later than the beginning of month 6 of your pregnancy.  Ask for help if you have counseling or nutritional needs during pregnancy. Your health care provider can offer advice or refer you to specialists for help with various needs.  Do not use hot tubs, steam rooms, or saunas.  Do not douche or use tampons or scented sanitary pads.  Do not cross your legs for long periods of time.  Avoid cat litter boxes and soil used by cats. These carry germs that can cause birth defects in the baby and possibly loss of the fetus by miscarriage or stillbirth.  Avoid all smoking, herbs, alcohol, and medicines not prescribed by your health care provider. Chemicals in these products affect the formation and growth of the baby.  Do not use any products that contain nicotine or tobacco, such as cigarettes and e-cigarettes. If you need help quitting, ask your health care provider. You may receive counseling support and other resources to help you quit.  Schedule a dentist appointment. At home, brush your teeth with a soft toothbrush and be gentle when you floss. Contact a health care provider if:  You have  dizziness.  You have mild pelvic cramps, pelvic pressure, or nagging pain in the abdominal area.  You have persistent nausea, vomiting, or diarrhea.  You have a bad smelling vaginal discharge.  You have pain when you urinate.  You notice increased swelling in your face, hands, legs, or ankles.  You are exposed to fifth disease or chickenpox.  You are exposed to [place] measles (rubella) and have never had it. Get help right away if:  You have a fever.  You are leaking fluid from your vagina.  You have spotting or bleeding from your vagina.  You have severe abdominal cramping or pain.  You have rapid weight gain or loss.  You vomit blood or material that looks like coffee grounds.  You develop a severe headache.  You have shortness of breath.  You have any kind of trauma, such as from a fall or a car accident. Summary  The first trimester of pregnancy is from week 1 until the end of week 13 (months 1 through 3).  Your body goes through many changes during pregnancy. The changes vary from woman to woman.  You will have routine prenatal visits. During those visits, your health care provider will examine you, discuss any test results you may have, and talk with you about how you are feeling. This information is not intended to replace advice given to you by your health care provider. Make sure you discuss any questions you have with your health care provider. Document Released: 06/22/2001 Document Revised: 06/09/2016 Document Reviewed: 06/09/2016 Elsevier Interactive Patient Education  2017 [employer].

## 2016-09-15 NOTE — Progress Notes (Signed)
Pt c/o brownish discharge and vaginal odor, recently tx for chlamydia Prenatal labs Initial prenatal education packet given Unsure if she will breastfeed PHQ 9 =19, GAD 7 = 18, needs to see Asher MuirJamie if possible
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
## 2016-09-16 ENCOUNTER — Institutional Professional Consult (permissible substitution): Payer: Medicaid Other

## 2016-09-16 LAB — HEMOGLOBINOPATHY EVALUATION
Ferritin: 30 ng/mL (ref 15–150)
Hgb A2 Quant: 2.4 % (ref 1.8–3.2)
Hgb A: 97 % (ref 96.4–98.8)
Hgb C: 0 %
Hgb F Quant: 0.6 % (ref 0.0–2.0)
Hgb S: 0 %
Hgb Solubility: NEGATIVE
Hgb Variant: 0 %

## 2016-09-16 LAB — PRENATAL PROFILE I(LABCORP)
Antibody Screen: NEGATIVE
Basophils Absolute: 0 10*3/uL (ref 0.0–0.2)
Basos: 0 %
EOS (ABSOLUTE): 0 10*3/uL (ref 0.0–0.4)
Eos: 1 %
Hematocrit: 35.3 % (ref 34.0–46.6)
Hemoglobin: 11.6 g/dL (ref 11.1–15.9)
Hepatitis B Surface Ag: NEGATIVE
Immature Grans (Abs): 0 10*3/uL (ref 0.0–0.1)
Immature Granulocytes: 0 %
Lymphocytes Absolute: 1.1 10*3/uL (ref 0.7–3.1)
Lymphs: 19 %
MCH: 27.4 pg (ref 26.6–33.0)
MCHC: 32.9 g/dL (ref 31.5–35.7)
MCV: 84 fL (ref 79–97)
Monocytes Absolute: 0.4 10*3/uL (ref 0.1–0.9)
Monocytes: 7 %
Neutrophils Absolute: 4.2 10*3/uL (ref 1.4–7.0)
Neutrophils: 73 %
Platelets: 191 10*3/uL (ref 150–379)
RBC: 4.23 x10E6/uL (ref 3.77–5.28)
RDW: 14.6 % (ref 12.3–15.4)
RPR Ser Ql: NONREACTIVE
Rh Factor: POSITIVE
Rubella Antibodies, IGG: 2.74 index (ref >0.99)
WBC: 5.8 10*3/uL (ref 3.4–10.8)

## 2016-09-16 LAB — CERVICOVAGINAL ANCILLARY ONLY
Bacterial vaginitis: NEGATIVE
Candida vaginitis: NEGATIVE
Chlamydia: NEGATIVE
Neisseria Gonorrhea: NEGATIVE
Trichomonas: NEGATIVE

## 2016-09-16 LAB — HEMOGLOBIN A1C
Est. average glucose Bld gHb Est-mCnc: 100 mg/dL
Hgb A1c MFr Bld: 5.1 % (ref 4.8–5.6)

## 2016-09-17 LAB — CULTURE, OB URINE

## 2016-09-17 LAB — URINE CULTURE, OB REFLEX

## 2016-09-20 ENCOUNTER — Ambulatory Visit: Payer: Medicaid Other | Admitting: Neurology

## 2016-09-27 LAB — SPECIMEN STATUS REPORT

## 2016-09-27 LAB — CYSTIC FIBROSIS MUTATION 97: Interpretation: NOT DETECTED

## 2016-10-06 ENCOUNTER — Encounter: Payer: Self-pay | Admitting: Obstetrics and Gynecology

## 2016-10-06 DIAGNOSIS — Z3490 Encounter for supervision of normal pregnancy, unspecified, unspecified trimester: Secondary | ICD-10-CM | POA: Insufficient documentation

## 2016-10-07 NOTE — Addendum Note (Signed)
Encounter addended by: Danae Orleanseirdre C Dedee Liss, CNM on: 10/07/2016  3:30 PM<BR>    Actions taken: Problem List modified, Visit diagnoses modified, Problem List reviewed

## 2016-10-13 ENCOUNTER — Ambulatory Visit (INDEPENDENT_AMBULATORY_CARE_PROVIDER_SITE_OTHER): Payer: Medicaid Other | Admitting: Obstetrics and Gynecology

## 2016-10-13 ENCOUNTER — Telehealth: Payer: Self-pay | Admitting: *Deleted

## 2016-10-13 ENCOUNTER — Other Ambulatory Visit (HOSPITAL_COMMUNITY)
Admission: RE | Admit: 2016-10-13 | Discharge: 2016-10-13 | Disposition: A | Discharge status: Home / Self Care | Payer: Medicaid Other | Source: Ambulatory Visit | Attending: Obstetrics and Gynecology | Admitting: Obstetrics and Gynecology

## 2016-10-13 VITALS — BP 106/76 | HR 87 | Wt 228.7 lb

## 2016-10-13 DIAGNOSIS — Z3A14 14 weeks gestation of pregnancy: Secondary | ICD-10-CM | POA: Diagnosis not present

## 2016-10-13 DIAGNOSIS — O99341 Other mental disorders complicating pregnancy, first trimester: Secondary | ICD-10-CM

## 2016-10-13 DIAGNOSIS — F329 Major depressive disorder, single episode, unspecified: Secondary | ICD-10-CM

## 2016-10-13 DIAGNOSIS — O26892 Other specified pregnancy related conditions, second trimester: Secondary | ICD-10-CM | POA: Insufficient documentation

## 2016-10-13 DIAGNOSIS — R110 Nausea: Secondary | ICD-10-CM

## 2016-10-13 DIAGNOSIS — O26891 Other specified pregnancy related conditions, first trimester: Secondary | ICD-10-CM

## 2016-10-13 DIAGNOSIS — N898 Other specified noninflammatory disorders of vagina: Secondary | ICD-10-CM | POA: Diagnosis not present

## 2016-10-13 DIAGNOSIS — Z113 Encounter for screening for infections with a predominantly sexual mode of transmission: Secondary | ICD-10-CM

## 2016-10-13 DIAGNOSIS — O26899 Other specified pregnancy related conditions, unspecified trimester: Secondary | ICD-10-CM

## 2016-10-13 DIAGNOSIS — Z3481 Encounter for supervision of other normal pregnancy, first trimester: Secondary | ICD-10-CM

## 2016-10-13 DIAGNOSIS — O219 Vomiting of pregnancy, unspecified: Secondary | ICD-10-CM | POA: Diagnosis not present

## 2016-10-13 DIAGNOSIS — O0971 Supervision of high risk pregnancy due to social problems, first trimester: Secondary | ICD-10-CM

## 2016-10-13 DIAGNOSIS — Z3482 Encounter for supervision of other normal pregnancy, second trimester: Secondary | ICD-10-CM

## 2016-10-13 MED ORDER — ONDANSETRON 4 MG PO TBDP: 4.0000 mg | ORAL_TABLET | Freq: Four times a day (QID) | ORAL | 0 refills | Status: DC | PRN

## 2016-10-13 NOTE — Progress Notes (Signed)
PHQ-9 and GAD-7 are very high. No SI or HI. Patient presenting to clinic with her therapist who she sees weekly and has begun taking zoloft.

## 2016-10-13 NOTE — Telephone Encounter (Signed)
Notified patient of [place] appt 5/8 @ 0930. Understanding voiced.

## 2016-10-13 NOTE — Progress Notes (Signed)
   PRENATAL VISIT NOTE  Subjective:  Chloe Rodgers is a 26 y.o. [id number] at [redacted]w[redacted]d being seen today for ongoing prenatal care.  She is currently monitored for the following issues for this low-risk pregnancy and has Seizure disorder (HCC); Family history of breast cancer in mother; Previous cesarean delivery, antepartum condition or complication; Obese; Family conflict; Migraines; PID (acute pelvic inflammatory disease); Nephrolithiasis; Chlamydia infection affecting pregnancy in first trimester; and Encounter for supervision of other normal pregnancy on her problem list.  Patient reports vaginal iritation and itch. Reports spotting brownsih red on TP 2 d ago and not sure of source, questions iif due to hemorrhoids.   . Vag. Bleeding: Scant.   . Denies leaking of fluid. Nausea and decreased appetite. Tried Phenergan ut makes her sleepy and does not help. Zofran helped in last pregnancy.  Still depressed and started Zoloft as prescribed at last visit. Has no family support but her therapist is with her daily. No SI/HI  The following portions of the patient's history were reviewed and updated as appropriate: allergies, current medications, past family history, past medical history, past social history, past surgical history and problem list. Problem list updated.  Objective:   Vitals:   10/13/16 1048  BP: 106/76  Pulse: 87  Weight: 228 lb 11.2 oz (103.7 kg)    Fetal Status: Fetal Heart Rate (bpm): 158         General:  Alert, oriented and cooperative. Patient is in no acute distress.  Skin: Skin is warm and dry. No rash noted.   Cardiovascular: Normal heart rate noted  Respiratory: Normal respiratory effort, no problems with respiration noted  Abdomen: Soft, gravid, appropriate for gestational age. Pain/Pressure: Present     Pelvic:  Cervical exam performed        Extremities: Normal range of motion.  Edema: None  Mental Status: Normal mood and affect. Normal behavior. Normal judgment and  thought content.   Assessment and Plan:  Pregnancy: A5W0981 at [redacted]w[redacted]d   ICD-9-CM ICD-10-CM   1. Supervision of high risk pregnancy due to social problems in first trimester V23.89 O09.71   2. Depression affecting pregnancy in first trimester, antepartum 648.43 O99.341    311 F32.9   3. Pregnancy related nausea, antepartum 643.03 O26.899     R11.0   4. Encounter for supervision of other normal pregnancy in second trimester V22.1 Z34.82    Will obtain quad screen and schedule anatomic [place]  Preterm labor symptoms and general obstetric precautions including but not limited to vaginal bleeding, contractions, leaking of fluid and fetal movement were reviewed in detail with the patient. Please refer to After Visit Summary for other counseling recommendations.     Danae Orleans, CNM

## 2016-10-13 NOTE — Patient Instructions (Signed)
Morning Sickness Morning sickness is when you feel sick to your stomach (nauseous) during pregnancy. This nauseous feeling may or may not come with vomiting. It often occurs in the morning but can be a problem any time of day. Morning sickness is most common during the first trimester, but it may continue throughout pregnancy. While morning sickness is unpleasant, it is usually harmless unless you develop severe and continual vomiting (hyperemesis gravidarum). This condition requires more intense treatment. What are the causes? The cause of morning sickness is not completely known but seems to be related to normal hormonal changes that occur in pregnancy. What increases the risk? You are at greater risk if you:  Experienced nausea or vomiting before your pregnancy.  Had morning sickness during a previous pregnancy.  Are pregnant with more than one baby, such as twins. How is this treated? Do not use any medicines (prescription, over-the-counter, or herbal) for morning sickness without first talking to your health care provider. Your health care provider may prescribe or recommend:  Vitamin B6 supplements.  Anti-nausea medicines.  The herbal medicine ginger. Follow these instructions at home:  Only take over-the-counter or prescription medicines as directed by your health care provider.  Taking multivitamins before getting pregnant can prevent or decrease the severity of morning sickness in most women.  Eat a piece of dry toast or unsalted crackers before getting out of bed in the morning.  Eat five or six small meals a day.  Eat dry and bland foods (rice, baked potato). Foods high in carbohydrates are often helpful.  Do not drink liquids with your meals. Drink liquids between meals.  Avoid greasy, fatty, and spicy foods.  Get someone to cook for you if the smell of any food causes nausea and vomiting.  If you feel nauseous after taking prenatal vitamins, take the vitamins at  night or with a snack.  Snack on protein foods (nuts, yogurt, cheese) between meals if you are hungry.  Eat unsweetened gelatins for desserts.  Wearing an acupressure wristband (worn for sea sickness) may be helpful.  Acupuncture may be helpful.  Do not smoke.  Get a humidifier to keep the air in your house free of odors.  Get plenty of fresh air. Contact a health care provider if:  Your home remedies are not working, and you need medicine.  You feel dizzy or lightheaded.  You are losing weight. Get help right away if:  You have persistent and uncontrolled nausea and vomiting.  You pass out (faint). This information is not intended to replace advice given to you by your health care provider. Make sure you discuss any questions you have with your health care provider. Document Released: 08/19/2006 Document Revised: 12/04/2015 Document Reviewed: 12/13/2012 Elsevier Interactive Patient Education  2017 [employer]. [place] trimestre de Psychiatrist (Second Trimester of Pregnancy) El segundo trimestre va desde la semana13 hasta la 28, desde el cuarto hasta el sexto mes, y suele ser el momento en el que mejor se siente. Su organismo se ha adaptado a Charity fundraiser y comienza a Diplomatic Services operational officer. En general, las nuseas matutinas han disminuido o han desaparecido completamente, puede tener ms energa y un aumento de apetito. El segundo trimestre es tambin la poca en la que el feto se desarrolla rpidamente. Hacia el final del sexto mes, el feto mide aproximadamente 9pulgadas (23cm) y pesa alrededor de 1 libras (700g). Es probable que sienta que el beb se Teacher, English as a foreign language (da pataditas) entre las 18 y 20semanas del Psychiatrist. CAMBIOS EN  EL ORGANISMO Su organismo atraviesa por muchos cambios durante el [place], y estos varan de Neomia Dear mujer a Educational psychologist.  Seguir [employer]. Notar que la parte baja del abdomen sobresale.  Podrn aparecer las primeras [employer] caderas, el  abdomen y las [place].  Es posible que tenga dolores de cabeza que pueden aliviarse con los medicamentos que el mdico le permita tomar.  Tal vez tenga necesidad de orinar con ms frecuencia porque el feto est ejerciendo presin Ambulance person.  Debido al Vanetta Mulders podr sentir Anthoney Harada estomacal con frecuencia.  Puede estar estreida, ya que ciertas hormonas enlentecen los movimientos de los msculos que [employer] desechos a travs de los intestinos.  Pueden aparecer hemorroides o abultarse e hincharse las venas (venas varicosas).  Puede tener dolor de espalda que se debe al [employer] de peso y a que las hormonas del Management consultant las articulaciones entre los huesos de la pelvis, y Public librarian consecuencia de la modificacin del peso y los msculos que mantienen el equilibrio.  Las [employer] seguirn creciendo y Development worker, community.  Las Veterinary surgeon y estar sensibles al cepillado y al hilo dental.  Pueden aparecer zonas oscuras o manchas (cloasma, mscara del Psychiatrist) en el rostro que probablemente se atenuar despus del nacimiento del beb.  Es posible que se forme una lnea oscura desde el ombligo hasta la zona del pubis (linea nigra) que probablemente se atenuar despus del nacimiento del beb.  Tal vez haya cambios en el cabello que pueden incluir su engrosamiento, crecimiento rpido y cambios en la textura. Adems, a algunas mujeres se les cae el cabello durante o despus del embarazo, o tienen el cabello seco o fino. Lo ms probable es que el cabello se le normalice despus del nacimiento del beb. QU DEBE ESPERAR EN LAS CONSULTAS PRENATALES Durante una visita prenatal de rutina:  La pesarn para asegurarse de que usted y el feto estn creciendo normalmente.  Le tomarn la presin arterial.  Le medirn el abdomen para controlar el desarrollo del beb.  Se escucharn los latidos cardacos fetales.  Se evaluarn los resultados de los estudios solicitados en visitas anteriores. El  mdico puede preguntarle lo siguiente:  Cmo se siente.  Si siente los movimientos del beb.  Si ha tenido sntomas anormales, como prdida de lquido, Sims, dolores de cabeza intensos o clicos abdominales.  Si est consumiendo algn producto que contenga tabaco, como cigarrillos, tabaco de Theatre manager y Administrator, Civil Service.  Si tiene [employer]. Otros estudios que podrn realizarse durante el segundo trimestre incluyen lo siguiente:  Anlisis de sangre para detectar lo siguiente:  Concentraciones de hierro bajas (anemia).  Diabetes gestacional (entre la semana 24 y la 28).  Anticuerpos Rh.  Anlisis de orina para detectar infecciones, diabetes o protenas en la orina.  Una ecografa para confirmar que el beb crece y se desarrolla correctamente.  Una amniocentesis para diagnosticar posibles problemas genticos.  Estudios del feto para descartar espina bfida y sndrome de Down.  Prueba del VIH (virus de inmunodeficiencia humana). Los exmenes prenatales de rutina incluyen la prueba de deteccin del VIH, a menos que decida no Futures trader. INSTRUCCIONES PARA EL CUIDADO EN EL HOGAR  Evite fumar, consumir hierbas, beber alcohol y tomar frmacos que no le hayan recetado. Estas sustancias qumicas afectan la formacin y el desarrollo del beb.  No consuma ningn producto que contenga tabaco, lo que incluye cigarrillos, tabaco de Theatre manager y Administrator, Civil Service. Si necesita ayuda para dejar de fumar, consulte al [employer]. Puede recibir asesoramiento  y otro tipo de recursos para dejar de fumar.  Siga las indicaciones del mdico en relacin con el uso de medicamentos. Durante el embarazo, hay medicamentos que son seguros de tomar y otros que no.  Haga ejercicio solamente como se lo haya indicado el mdico. Sentir clicos uterinos es un buen signo para Restaurant manager, fast food actividad fsica.  Contine comiendo alimentos sanos con regularidad.  Use un sostn que le brinde buen soporte  si le [employer].  No se d baos de inmersin en agua caliente, baos turcos ni saunas.  Use el cinturn de seguridad en todo momento mientras conduce.  No coma carne cruda ni queso sin cocinar; evite el contacto con las bandejas sanitarias de los gatos y la tierra que estos animales usan. Estos elementos contienen grmenes que pueden causar defectos congnitos en el beb.  Tome las vitaminas prenatales.  Tome entre 1500 y  de calcio diariamente comenzando en la semana20 del embarazo [place].  Si est estreida, pruebe un laxante suave (si el mdico lo autoriza). Consuma ms alimentos ricos en fibra, como vegetales y frutas frescos y Radiation protection practitioner. Beba gran cantidad de lquido para mantener la orina de tono claro o color amarillo plido.  Dese baos de asiento con agua tibia para Engineer, materials o las molestias causadas por las hemorroides. Use una crema para las hemorroides si el mdico la autoriza.  Si tiene venas varicosas, use medias de descanso. Eleve los pies durante , 3 o 4veces por da. Limite el consumo de sal en su dieta.  No levante objetos pesados, use zapatos de tacones bajos y [address].  Descanse con las piernas elevadas si tiene calambres o dolor de cintura.  Visite a su dentista si an no lo ha Occupational hygienist. Use un cepillo de dientes blando para higienizarse los dientes y psese el hilo dental con suavidad.  Puede seguir [employer], a menos que el mdico le indique lo contrario.  Concurra a todas las visitas prenatales segn las indicaciones de su mdico. SOLICITE ATENCIN MDICA SI:  [place].  Siente clicos leves, presin en la pelvis o dolor persistente en el abdomen.  Tiene nuseas, vmitos o diarrea persistentes.  Brett Fairy secrecin vaginal con mal olor.  Siente dolor al [employer]. SOLICITE ATENCIN MDICA DE INMEDIATO SI:  Tiene fiebre.  Tiene una prdida  de lquido por la vagina.  Tiene sangrado o pequeas prdidas vaginales.  Siente dolor intenso o clicos en el abdomen.  Sube o baja de peso rpidamente.  Tiene dificultad para respirar y siente dolor de pecho.  Sbitamente se le hinchan mucho el rostro, las [place], los tobillos, los pies o las piernas.  No ha sentido los movimientos del beb durante Georgianne Fick.  Siente un dolor de cabeza intenso que no se alivia con medicamentos.  Su visin se modifica. Esta informacin no tiene Theme park manager el consejo del mdico. Asegrese de hacerle al mdico cualquier pregunta que tenga. Document Released: 04/07/2005 Document Revised: 07/19/2014 Document Reviewed: 08/29/2012 Elsevier Interactive Patient Education  2017 [employer].

## 2016-10-13 NOTE — Progress Notes (Signed)
Home Medicaid Form Completed  

## 2016-10-14 LAB — CERVICOVAGINAL ANCILLARY ONLY
Bacterial vaginitis: NEGATIVE
Candida vaginitis: NEGATIVE
Trichomonas: NEGATIVE

## 2016-11-07 ENCOUNTER — Inpatient Hospital Stay (HOSPITAL_COMMUNITY)
Admission: AD | Admit: 2016-11-07 | Discharge: 2016-11-07 | Disposition: A | Discharge status: Home / Self Care | Payer: Medicaid Other | Source: Ambulatory Visit | Attending: Obstetrics & Gynecology | Admitting: Obstetrics & Gynecology

## 2016-11-07 ENCOUNTER — Encounter (HOSPITAL_COMMUNITY): Payer: Self-pay

## 2016-11-07 DIAGNOSIS — R51 Headache: Secondary | ICD-10-CM | POA: Diagnosis not present

## 2016-11-07 DIAGNOSIS — O26892 Other specified pregnancy related conditions, second trimester: Secondary | ICD-10-CM | POA: Diagnosis not present

## 2016-11-07 DIAGNOSIS — Z823 Family history of stroke: Secondary | ICD-10-CM | POA: Insufficient documentation

## 2016-11-07 DIAGNOSIS — Z825 Family history of asthma and other chronic lower respiratory diseases: Secondary | ICD-10-CM | POA: Diagnosis not present

## 2016-11-07 DIAGNOSIS — O99342 Other mental disorders complicating pregnancy, second trimester: Secondary | ICD-10-CM | POA: Insufficient documentation

## 2016-11-07 DIAGNOSIS — R102 Pelvic and perineal pain: Secondary | ICD-10-CM | POA: Diagnosis present

## 2016-11-07 DIAGNOSIS — Z833 Family history of diabetes mellitus: Secondary | ICD-10-CM | POA: Diagnosis not present

## 2016-11-07 DIAGNOSIS — O219 Vomiting of pregnancy, unspecified: Secondary | ICD-10-CM

## 2016-11-07 DIAGNOSIS — Z803 Family history of malignant neoplasm of breast: Secondary | ICD-10-CM | POA: Insufficient documentation

## 2016-11-07 DIAGNOSIS — F419 Anxiety disorder, unspecified: Secondary | ICD-10-CM | POA: Diagnosis not present

## 2016-11-07 DIAGNOSIS — Z8249 Family history of ischemic heart disease and other diseases of the circulatory system: Secondary | ICD-10-CM | POA: Insufficient documentation

## 2016-11-07 DIAGNOSIS — Z3A18 18 weeks gestation of pregnancy: Secondary | ICD-10-CM | POA: Insufficient documentation

## 2016-11-07 DIAGNOSIS — O99712 Diseases of the skin and subcutaneous tissue complicating pregnancy, second trimester: Secondary | ICD-10-CM | POA: Diagnosis not present

## 2016-11-07 DIAGNOSIS — R109 Unspecified abdominal pain: Secondary | ICD-10-CM

## 2016-11-07 DIAGNOSIS — L409 Psoriasis, unspecified: Secondary | ICD-10-CM | POA: Diagnosis not present

## 2016-11-07 DIAGNOSIS — Z3492 Encounter for supervision of normal pregnancy, unspecified, second trimester: Secondary | ICD-10-CM

## 2016-11-07 DIAGNOSIS — F329 Major depressive disorder, single episode, unspecified: Secondary | ICD-10-CM | POA: Insufficient documentation

## 2016-11-07 LAB — CBC
HCT: 34.1 % — ABNORMAL LOW (ref 36.0–46.0)
Hemoglobin: 11.6 g/dL — ABNORMAL LOW (ref 12.0–15.0)
MCH: 28.6 pg (ref 26.0–34.0)
MCHC: 34 g/dL (ref 30.0–36.0)
MCV: 84.2 fL (ref 78.0–100.0)
Platelets: 156 10*3/uL (ref 150–400)
RBC: 4.05 MIL/uL (ref 3.87–5.11)
RDW: 13.7 % (ref 11.5–15.5)
WBC: 7.5 10*3/uL (ref 4.0–10.5)

## 2016-11-07 LAB — URINALYSIS, ROUTINE W REFLEX MICROSCOPIC
Bilirubin Urine: NEGATIVE
Glucose, UA: NEGATIVE mg/dL
Hgb urine dipstick: NEGATIVE
Ketones, ur: NEGATIVE mg/dL
Leukocytes, UA: NEGATIVE
Nitrite: NEGATIVE
Protein, ur: NEGATIVE mg/dL
Specific Gravity, Urine: 1.018 (ref 1.005–1.030)
pH: 6 (ref 5.0–8.0)

## 2016-11-07 LAB — WET PREP, GENITAL
Clue Cells Wet Prep HPF POC: NONE SEEN
Sperm: NONE SEEN
Trich, Wet Prep: NONE SEEN
Yeast Wet Prep HPF POC: NONE SEEN

## 2016-11-07 MED ORDER — BUTALBITAL-APAP-CAFFEINE 50-325-40 MG PO TABS
2.0000 | ORAL_TABLET | Freq: Once | ORAL | Status: AC
  Administered 2016-11-07: 2 via ORAL
  Filled 2016-11-07: qty 2

## 2016-11-07 MED ORDER — ONDANSETRON 8 MG PO TBDP
8.0000 mg | ORAL_TABLET | Freq: Once | ORAL | Status: AC
  Administered 2016-11-07: 8 mg via ORAL
  Filled 2016-11-07: qty 1

## 2016-11-07 MED ORDER — IBUPROFEN 600 MG PO TABS
600.0000 mg | ORAL_TABLET | Freq: Once | ORAL | Status: AC
  Administered 2016-11-07: 600 mg via ORAL
  Filled 2016-11-07: qty 1

## 2016-11-07 MED ORDER — RANITIDINE HCL 150 MG PO TABS: 150.0000 mg | ORAL_TABLET | Freq: Two times a day (BID) | ORAL | 0 refills | Status: DC

## 2016-11-07 NOTE — Discharge Instructions (Signed)
General Headache Without Cause °A headache is pain or discomfort felt around the head or neck area. The specific cause of a headache may not be found. There are many causes and types of headaches. A few common ones are: °· Tension headaches. °· Migraine headaches. °· Cluster headaches. °· Chronic daily headaches. °Follow these instructions at home: °Watch your condition for any changes. Take these steps to help with your condition: °Managing pain °· Take over-the-counter and prescription medicines only as told by your health care provider. °· Lie down in a dark, quiet room when you have a headache. °· If directed, apply ice to the head and neck area: °¨ Put ice in a plastic bag. °¨ Place a towel between your skin and the bag. °¨ Leave the ice on for 20 minutes, 2-3 times per day. °· Use a heating pad or hot shower to apply heat to the head and neck area as told by your health care provider. °· Keep lights dim if bright lights bother you or make your headaches worse. °Eating and drinking °· Eat meals on a regular schedule. °· Limit alcohol use. °· Decrease the amount of caffeine you drink, or stop drinking caffeine. °General instructions °· Keep all follow-up visits as told by your health care provider. This is important. °· Keep a headache journal to help find out what may trigger your headaches. For example, write down: °¨ What you eat and drink. °¨ How much sleep you get. °¨ Any change to your diet or medicines. °· Try massage or other relaxation techniques. °· Limit stress. °· Sit up straight, and do not tense your muscles. °· Do not use tobacco products, including cigarettes, chewing tobacco, or e-cigarettes. If you need help quitting, ask your health care provider. °· Exercise regularly as told by your health care provider. °· Sleep on a regular schedule. Get 7-9 hours of sleep, or the amount recommended by your health care provider. °Contact a health care provider if: °· Your symptoms are not helped by  medicine. °· You have a headache that is different from the usual headache. °· You have nausea or you vomit. °· You have a fever. °Get help right away if: °· Your headache becomes severe. °· You have repeated vomiting. °· You have a stiff neck. °· You have a loss of vision. °· You have problems with speech. °· You have pain in the eye or ear. °· You have muscular weakness or loss of muscle control. °· You lose your balance or have trouble walking. °· You feel faint or pass out. °· You have confusion. °This information is not intended to replace advice given to you by your health care provider. Make sure you discuss any questions you have with your health care provider. °Document Released: 06/28/2005 Document Revised: 12/04/2015 Document Reviewed: 10/21/2014 °Elsevier Interactive Patient Education © 2017 Elsevier Inc. °Morning Sickness °Morning sickness is when you feel sick to your stomach (nauseous) during pregnancy. This nauseous feeling may or may not come with vomiting. It often occurs in the morning but can be a problem any time of day. Morning sickness is most common during the first trimester, but it may continue throughout pregnancy. While morning sickness is unpleasant, it is usually harmless unless you develop severe and continual vomiting (hyperemesis gravidarum). This condition requires more intense treatment. °What are the causes? °The cause of morning sickness is not completely known but seems to be related to normal hormonal changes that occur in pregnancy. °What increases the risk? °You   are at greater risk if you: °· Experienced nausea or vomiting before your pregnancy. °· Had morning sickness during a previous pregnancy. °· Are pregnant with more than one baby, such as twins. °How is this treated? °Do not use any medicines (prescription, over-the-counter, or herbal) for morning sickness without first talking to your health care provider. Your health care provider may prescribe or  recommend: °· Vitamin B6 supplements. °· Anti-nausea medicines. °· The herbal medicine ginger. °Follow these instructions at home: °· Only take over-the-counter or prescription medicines as directed by your health care provider. °· Taking multivitamins before getting pregnant can prevent or decrease the severity of morning sickness in most women. °· Eat a piece of dry toast or unsalted crackers before getting out of bed in the morning. °· Eat five or six small meals a day. °· Eat dry and bland foods (rice, baked potato). Foods high in carbohydrates are often helpful. °· Do not drink liquids with your meals. Drink liquids between meals. °· Avoid greasy, fatty, and spicy foods. °· Get someone to cook for you if the smell of any food causes nausea and vomiting. °· If you feel nauseous after taking prenatal vitamins, take the vitamins at night or with a snack. °· Snack on protein foods (nuts, yogurt, cheese) between meals if you are hungry. °· Eat unsweetened gelatins for desserts. °· Wearing an acupressure wristband (worn for sea sickness) may be helpful. °· Acupuncture may be helpful. °· Do not smoke. °· Get a humidifier to keep the air in your house free of odors. °· Get plenty of fresh air. °Contact a health care provider if: °· Your home remedies are not working, and you need medicine. °· You feel dizzy or lightheaded. °· You are losing weight. °Get help right away if: °· You have persistent and uncontrolled nausea and vomiting. °· You pass out (faint). °This information is not intended to replace advice given to you by your health care provider. Make sure you discuss any questions you have with your health care provider. °Document Released: 08/19/2006 Document Revised: 12/04/2015 Document Reviewed: 12/13/2012 °Elsevier Interactive Patient Education © 2017 Elsevier Inc. ° °
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
## 2016-11-07 NOTE — MAU Provider Note (Signed)
History     CSN: [id number]  Arrival date and time: 11/07/16 1640  First Provider Initiated Contact with Patient 11/07/16 1716      Chief Complaint  Patient presents with  . Pelvic Pain  . Headache  . Shortness of Breath   HPI  Chloe Rodgers is a 26 y.o. [id number] at [redacted]w[redacted]d who presents with headache & pelvic pain. Hx of migraine headaches -- was previously being treated by neurologist but stopped taking meds d/t pregnancy. Current headache began 3 days ago. Reports frontal headache that is constant & dull. Rates pain 10/10. Has not treated. Position changes & lights make pain worse.  Pelvic pain x 4 days. Pain is constant. Rates 8/10. Has not treated. Position changes relieve pain. Denies vaginal bleeding, vaginal discharge, or dysuria. Does want STD testing.  Chest pressure & SOB x 2 days. Symptoms occur when she's lying down at night. Sitting up relieves symptoms. Denies symptoms during the day.  Endorses nausea since beginning of pregnancy. No vomiting. Has been taking phenergan & zofran with minimal relief.   OB History    Gravida Para Term Preterm AB Living   0 1 2   SAB TAB Ectopic Multiple Live Births   0 1 0 0 2      Past Medical History:  Diagnosis Date  . Anemia   . Anxiety   . Chlamydia   . Complication of anesthesia    ??, seizure with wisdom teeth  . Depression   . Migraine   . PID (acute pelvic inflammatory disease) 2014  . PONV (postoperative nausea and vomiting)   . Psoriasis   . Seizures [facility])    July 2013 - [place]    Past Surgical History:  Procedure Laterality Date  . CESAREAN SECTION    . CESAREAN SECTION  06/06/2012   Procedure: CESAREAN SECTION;  Surgeon: Adam Phenix, MD;  Location: [facility];  Service: Obstetrics;  Laterality: N/A;  . SKIN GRAFT     off abd, onto arm  . WISDOM TOOTH EXTRACTION      Family History  Problem Relation Age of Onset  . Hypertension Mother   . Diabetes Mother   . Cancer Mother     BREAST  . Stroke  Mother   . Seizures Mother   . Asthma Daughter   . Hypertension Maternal Grandmother   . Diabetes Maternal Grandmother   . Cancer Maternal Grandmother     BONE CANCER  . Other Neg Hx     Social History  Substance Use Topics  . Smoking status: Never Smoker  . Smokeless tobacco: Never Used  . Alcohol use No     Comment: socially but none with pregnancy    Allergies: No Known Allergies  Prescriptions Prior to Admission  Medication Sig Dispense Refill Last Dose  . ondansetron (ZOFRAN ODT) 4 MG disintegrating tablet Take 1 tablet (4 mg total) by mouth every 6 (six) hours as needed for nausea. 20 tablet 0 Past Week at Unknown time  . sertraline (ZOLOFT) 50 MG tablet Take 1/2 tab po once a day for 7 days, then take 1 tab po qd (Patient taking differently: Take 50 mg by mouth daily. Take 1/2 tab po once a day for 7 days, then take 1 tab po qd) 30 tablet 2 11/07/2016 at Unknown time  . triamcinolone cream (KENALOG) 0.1 % Apply 1 application topically 2 (two) times daily as needed (For eczema.).   Past Week at Unknown time  .  Prenatal Vit-Fe Fumarate-FA (PRENATAL COMPLETE) 14-0.4 MG TABS Take 1 tablet by mouth daily. (Patient not taking: Reported on 08/19/2016) 60 each 0 Not Taking at Unknown time  . promethazine (PHENERGAN) 25 MG tablet Take 0.5-1 tablets (12.5-25 mg total) by mouth every 6 (six) hours as needed. (Patient not taking: Reported on 09/15/2016) 30 tablet 3 Not Taking at Unknown time    Review of Systems  Constitutional: Negative.   Respiratory: Positive for shortness of breath (not currently).   Cardiovascular: Positive for chest pain (not currently).  Gastrointestinal: Positive for nausea. Negative for abdominal pain, constipation, diarrhea and vomiting.  Genitourinary: Positive for pelvic pain. Negative for dysuria, vaginal bleeding and vaginal discharge.   Physical Exam   Blood pressure (!) 107/58, pulse 83, temperature 97.9 F (36.6 C), temperature source Oral, resp. rate  18, height  (1.676 m), weight 228 lb (103.4 kg), last menstrual period 07/04/2016, SpO2 99 %.  Physical Exam  Nursing note and vitals reviewed. Constitutional: She is oriented to person, place, and time. She appears well-developed and well-nourished. No distress.  HENT:  Head: Normocephalic and atraumatic.  Eyes: Conjunctivae are normal. Right eye exhibits no discharge. Left eye exhibits no discharge. No scleral icterus.  Neck: Normal range of motion.  Cardiovascular: Normal rate, regular rhythm and normal heart sounds.   No murmur heard. Respiratory: Effort normal and breath sounds normal. No respiratory distress. She has no wheezes.  GI: Soft. Bowel sounds are normal. There is no tenderness.  Genitourinary: Cervix exhibits no motion tenderness.  Genitourinary Comments: Cervix closed  Neurological: She is alert and oriented to person, place, and time.  Skin: Skin is warm and dry. She is not diaphoretic.  Psychiatric: She has a normal mood and affect. Her behavior is normal. Judgment and thought content normal.    MAU Course  Procedures Results for orders placed or performed during the hospital encounter of 11/07/16 (from the past 24 hour(s))  Urinalysis, Routine w reflex microscopic     Status: None   Collection Time: 11/07/16  4:45 PM  Result Value Ref Range   Color, Urine YELLOW YELLOW   APPearance CLEAR CLEAR   Specific Gravity, Urine 1.018 1.005 - 1.030   pH 6.0 5.0 - 8.0   Glucose, UA NEGATIVE NEGATIVE mg/dL   Hgb urine dipstick NEGATIVE NEGATIVE   Bilirubin Urine NEGATIVE NEGATIVE   Ketones, ur NEGATIVE NEGATIVE mg/dL   Protein, ur NEGATIVE NEGATIVE mg/dL   Nitrite NEGATIVE NEGATIVE   Leukocytes, UA NEGATIVE NEGATIVE  CBC     Status: Abnormal   Collection Time: 11/07/16  5:54 PM  Result Value Ref Range   WBC 7.5 4.0 - 10.5 K/uL   RBC 4.05 3.87 - 5.11 MIL/uL   Hemoglobin 11.6 (L) 12.0 - 15.0 g/dL   HCT 40.9 (L) 81.1 - 91.4 %   MCV 84.2 78.0 - 100.0 fL   MCH  28.6 26.0 - 34.0 pg   MCHC 34.0 30.0 - 36.0 g/dL   RDW 78.2 95.6 - 21.3 %   Platelets 156 150 - 400 K/uL  Wet prep, genital     Status: Abnormal   Collection Time: 11/07/16  8:18 PM  Result Value Ref Range   Yeast Wet Prep HPF POC NONE SEEN NONE SEEN   Trich, Wet Prep NONE SEEN NONE SEEN   Clue Cells Wet Prep HPF POC NONE SEEN NONE SEEN   WBC, Wet Prep HPF POC FEW (A) NONE SEEN   Sperm NONE SEEN  MDM FHT 164 by doppler VSS, SpO2 99% NAD EKG -- NSR CBC -- hemoglobin stable Pt decline IV headache cocktail Zofran 8 mg ODT & fioricet 2 tabs po -- pt reports no relief Ibuprofen 600 mg PO -- patient reports relief, pain 10>4 GC/CT & wet prep collected -- Cervix closed Assessment and Plan  A: 1. Nausea and vomiting during pregnancy prior to [redacted] weeks gestation   2. Headache in pregnancy, antepartum, second trimester   3. Fetal heart tones present, second trimester   4. Pelvic pain affecting pregnancy in second trimester, antepartum    P: Discharge home Rx zantac Use pillows to prop self up while sleeping Discussed reasons to return to MAU Keep follow up appointment with OB/PCP  GC/CT pending   Judeth Horn 11/07/2016, 5:16 PM

## 2016-11-07 NOTE — Progress Notes (Addendum)
G4P2 @ [redacted]wksga. Presents to triage for n/v and abdominal pain. Denies LOF or bleeding.   VSS. See flow sheet for details.   1840: provider at bs assessing. Orders received for CBC, and meds.  1850: Champlain paged and chaplain returned page. Report status of pt given. Stated would be in unit in 30-40 minutes. Pt made aware.   1814: EKG notified.

## 2016-11-08 LAB — GC/CHLAMYDIA PROBE AMP (~~LOC~~) NOT AT ARMC
Chlamydia: NEGATIVE
Neisseria Gonorrhea: NEGATIVE

## 2016-11-11 ENCOUNTER — Ambulatory Visit (INDEPENDENT_AMBULATORY_CARE_PROVIDER_SITE_OTHER): Payer: Medicaid Other | Admitting: Obstetrics & Gynecology

## 2016-11-11 VITALS — BP 117/63 | HR 91 | Wt 234.1 lb

## 2016-11-11 DIAGNOSIS — R102 Pelvic and perineal pain: Secondary | ICD-10-CM

## 2016-11-11 DIAGNOSIS — Z3482 Encounter for supervision of other normal pregnancy, second trimester: Secondary | ICD-10-CM

## 2016-11-11 DIAGNOSIS — O26892 Other specified pregnancy related conditions, second trimester: Secondary | ICD-10-CM

## 2016-11-11 MED ORDER — CYCLOBENZAPRINE HCL 10 MG PO TABS: 10.0000 mg | ORAL_TABLET | Freq: Three times a day (TID) | ORAL | 1 refills | Status: DC | PRN

## 2016-11-11 NOTE — Patient Instructions (Signed)
Return to clinic for any scheduled appointments or obstetric concerns, or go to MAU for evaluation  

## 2016-11-11 NOTE — Progress Notes (Signed)
   PRENATAL VISIT NOTE  Subjective:  Chloe Rodgers is a 26 y.o. [id number] at 3561w4d being seen today for ongoing prenatal care.  She is currently monitored for the following issues for this low-risk pregnancy and has Seizure disorder (HCC); Family history of breast cancer in mother; Previous cesarean delivery, antepartum condition or complication; Obese; Migraines; Chlamydia infection affecting pregnancy in first trimester; and Supervision of normal pregnancy on her problem list.  Patient reports continued pelvic pain, was seen recently in MAU for this and symptoms helped with oral analgesia.  Contractions: Not present. Vag. Bleeding: None.  Movement: Present. Denies leaking of fluid.   The following portions of the patient's history were reviewed and updated as appropriate: allergies, current medications, past family history, past medical history, past social history, past surgical history and problem list. Problem list updated.  Objective:   Vitals:   11/11/16 1116  BP: 117/63  Pulse: 91  Weight: 234 lb 1.6 oz (106.2 kg)    Fetal Status: Fetal Heart Rate (bpm): 156   Movement: Present     General:  Alert, oriented and cooperative. Patient is in no acute distress.  Skin: Skin is warm and dry. No rash noted.   Cardiovascular: Normal heart rate noted  Respiratory: Normal respiratory effort, no problems with respiration noted  Abdomen: Soft, gravid, appropriate for gestational age. Pain/Pressure: Absent     Pelvic:  Cervical exam deferred        Extremities: Normal range of motion.  Edema: None  Mental Status: Normal mood and affect. Normal behavior. Normal judgment and thought content.   Assessment and Plan:  Pregnancy: [id number] at 1061w4d  1. Pelvic pain affecting pregnancy in second trimester, antepartum Flexeril ordered as needed. - cyclobenzaprine (FLEXERIL) 10 MG tablet; Take 1 tablet (10 mg total) by mouth every 8 (eight) hours as needed for muscle spasms.  Dispense: 30 tablet;  Refill: 1  2. Encounter for supervision of other normal pregnancy in second trimester HIV checked today, was inadvertently omitted during initial prenatal labs check; patient noticed this on her review of MyChart. No other complaints or concerns.  Routine obstetric precautions reviewed. Please refer to After Visit Summary for other counseling recommendations.  Return in about 4 weeks (around 12/09/2016) for OB Visit (LOB).   Tereso NewcomerUgonna A Jovani Flury, MD

## 2016-11-12 LAB — HIV ANTIBODY (ROUTINE TESTING W REFLEX): HIV Screen 4th Generation wRfx: NONREACTIVE

## 2016-11-16 ENCOUNTER — Ambulatory Visit (HOSPITAL_COMMUNITY)
Admission: RE | Admit: 2016-11-16 | Discharge: 2016-11-16 | Disposition: A | Discharge status: Home / Self Care | Payer: Medicaid Other | Source: Ambulatory Visit | Attending: Obstetrics and Gynecology | Admitting: Obstetrics and Gynecology

## 2016-11-16 ENCOUNTER — Other Ambulatory Visit: Payer: Self-pay | Admitting: Obstetrics and Gynecology

## 2016-11-16 ENCOUNTER — Ambulatory Visit (HOSPITAL_COMMUNITY): Payer: Medicaid Other

## 2016-11-16 DIAGNOSIS — O34219 Maternal care for unspecified type scar from previous cesarean delivery: Secondary | ICD-10-CM

## 2016-11-16 DIAGNOSIS — O0972 Supervision of high risk pregnancy due to social problems, second trimester: Secondary | ICD-10-CM | POA: Insufficient documentation

## 2016-11-16 DIAGNOSIS — O99212 Obesity complicating pregnancy, second trimester: Secondary | ICD-10-CM | POA: Diagnosis not present

## 2016-11-16 DIAGNOSIS — Z3482 Encounter for supervision of other normal pregnancy, second trimester: Secondary | ICD-10-CM

## 2016-11-16 DIAGNOSIS — Z3A19 19 weeks gestation of pregnancy: Secondary | ICD-10-CM | POA: Diagnosis not present

## 2016-11-16 DIAGNOSIS — Z369 Encounter for antenatal screening, unspecified: Secondary | ICD-10-CM

## 2016-11-16 DIAGNOSIS — O0971 Supervision of high risk pregnancy due to social problems, first trimester: Secondary | ICD-10-CM

## 2016-12-01 ENCOUNTER — Encounter (HOSPITAL_COMMUNITY): Payer: Self-pay

## 2016-12-01 ENCOUNTER — Inpatient Hospital Stay (HOSPITAL_COMMUNITY)
Admission: AD | Admit: 2016-12-01 | Discharge: 2016-12-01 | Disposition: A | Discharge status: Home / Self Care | Payer: Medicaid Other | Source: Ambulatory Visit | Attending: Family Medicine | Admitting: Family Medicine

## 2016-12-01 DIAGNOSIS — K5900 Constipation, unspecified: Secondary | ICD-10-CM | POA: Insufficient documentation

## 2016-12-01 DIAGNOSIS — Z3A21 21 weeks gestation of pregnancy: Secondary | ICD-10-CM | POA: Diagnosis not present

## 2016-12-01 DIAGNOSIS — Z8249 Family history of ischemic heart disease and other diseases of the circulatory system: Secondary | ICD-10-CM | POA: Diagnosis not present

## 2016-12-01 DIAGNOSIS — O99612 Diseases of the digestive system complicating pregnancy, second trimester: Secondary | ICD-10-CM | POA: Diagnosis not present

## 2016-12-01 DIAGNOSIS — O26892 Other specified pregnancy related conditions, second trimester: Secondary | ICD-10-CM | POA: Insufficient documentation

## 2016-12-01 DIAGNOSIS — R102 Pelvic and perineal pain: Secondary | ICD-10-CM | POA: Diagnosis present

## 2016-12-01 DIAGNOSIS — Z833 Family history of diabetes mellitus: Secondary | ICD-10-CM | POA: Insufficient documentation

## 2016-12-01 DIAGNOSIS — Z825 Family history of asthma and other chronic lower respiratory diseases: Secondary | ICD-10-CM | POA: Insufficient documentation

## 2016-12-01 DIAGNOSIS — Z823 Family history of stroke: Secondary | ICD-10-CM | POA: Diagnosis not present

## 2016-12-01 DIAGNOSIS — N949 Unspecified condition associated with female genital organs and menstrual cycle: Secondary | ICD-10-CM

## 2016-12-01 LAB — WET PREP, GENITAL
Clue Cells Wet Prep HPF POC: NONE SEEN
Sperm: NONE SEEN
Trich, Wet Prep: NONE SEEN
Yeast Wet Prep HPF POC: NONE SEEN

## 2016-12-01 LAB — URINALYSIS, ROUTINE W REFLEX MICROSCOPIC
Bilirubin Urine: NEGATIVE
Glucose, UA: NEGATIVE mg/dL
Hgb urine dipstick: NEGATIVE
Ketones, ur: NEGATIVE mg/dL
Leukocytes, UA: NEGATIVE
Nitrite: NEGATIVE
Protein, ur: NEGATIVE mg/dL
Specific Gravity, Urine: 1.02 (ref 1.005–1.030)
pH: 7 (ref 5.0–8.0)

## 2016-12-01 MED ORDER — IBUPROFEN 600 MG PO TABS
600.0000 mg | ORAL_TABLET | Freq: Once | ORAL | Status: AC
  Administered 2016-12-01: 600 mg via ORAL
  Filled 2016-12-01: qty 1

## 2016-12-01 MED ORDER — CYCLOBENZAPRINE HCL 10 MG PO TABS: 10.0000 mg | ORAL_TABLET | Freq: Three times a day (TID) | ORAL | 1 refills | Status: DC | PRN

## 2016-12-01 NOTE — Discharge Instructions (Signed)
Constipation, Adult Constipation is when a person:  Poops (has a bowel movement) fewer times in a week than normal.  Has a hard time pooping.  Has poop that is dry, hard, or bigger than normal. Follow these instructions at home: Eating and drinking    Eat foods that have a lot of fiber, such as:  Fresh fruits and vegetables.  Whole grains.  Beans.  Eat less of foods that are high in fat, low in fiber, or overly processed, such as:  [place] fries.  Hamburgers.  Cookies.  Candy.  Soda.  Drink enough fluid to keep your pee (urine) clear or pale yellow. General instructions   Exercise regularly or as told by your doctor.  Go to the restroom when you feel like you need to poop. Do not hold it in.  Take over-the-counter and prescription medicines only as told by your doctor. These include any fiber supplements.  Do pelvic floor retraining exercises, such as:  Doing deep breathing while relaxing your lower belly (abdomen).  Relaxing your pelvic floor while pooping.  Watch your condition for any changes.  Keep all follow-up visits as told by your doctor. This is important. Contact a doctor if:  You have pain that gets worse.  You have a fever.  You have not pooped for 4 days.  You throw up (vomit).  You are not hungry.  You lose weight.  You are bleeding from the anus.  You have thin, pencil-like poop (stool). Get help right away if:  You have a fever, and your symptoms suddenly get worse.  You leak poop or have blood in your poop.  Your belly feels hard or bigger than normal (is bloated).  You have very bad belly pain.  You feel dizzy or you faint. This information is not intended to replace advice given to you by your health care provider. Make sure you discuss any questions you have with your health care provider. Document Released: 12/15/2007 Document Revised: 01/16/2016 Document Reviewed: 12/17/2015 Elsevier Interactive Patient Education   2017 [employer] Constipation    You have constipation which is hard stools that are difficult to pass. It is important to have regular bowel movements every 1-3 days that are soft and easy to pass. Hard stools increase your risk of hemorrhoids and are very uncomfortable.   To prevent constipation you can increase the amount of fiber in your diet. Examples of foods with fiber are leafy greens, whole grain breads, oatmeal and other grains.  It is also important to drink at least eight 8oz glass of water everyday.   If you have not has a bowel movement in 4-5 days you made need to clean out your bowel.  This will have establish normal movement through your bowel.    Miralax Clean out  Take 8 capfuls of miralax in 64 oz of gatorade. You can use any fluid that appeals to you (gatorade, water, juice)  Continue to drink at least eight 8 oz glasses of water throughout the day  You can repeat with another 8 capfuls of miralax in 64 oz of gatorade if you are not having a large amount of stools  You will need to be at home and close to a bathroom for about 8 hours when you do the above as you may need to go to the bathroom frequently.   After you are cleaned out: - Start Colace100mg  twice daily - Start Miralax once daily - Start a daily fiber supplement like metamucil  or citrucel - You can safely use enemas in pregnancy  - if you are having diarrhea you can reduce to Colace once a day or miralax every other day or a 1/2 capful daily.

## 2016-12-01 NOTE — MAU Note (Signed)
Pt c/o pelvic pain and pressure that started about 3 days ago. Pt states it has been getting worse and now it is unbearable. Pt denies bleeding, leaking and contractions. Pt states baby is moving normally.

## 2016-12-01 NOTE — MAU Provider Note (Signed)
History     CSN: [id number]  Arrival date and time: 12/01/16 4098   First Provider Initiated Contact with Patient 12/01/16 [phone]      Chief Complaint  Patient presents with  . Pelvic Pain   HPI   Ms.Chloe Rodgers is a 26 y.o. female [phone] @ [redacted]w[redacted]d here in MAU continued pelvic pain. The pain started weeks ago. She has been seen in the MAU for this pain and reported it in her prenatal visits. She was given Flexeril at her last OB visit and was never able to pick it up because she says it was not at the pharmacy. The pain is constant.  She has had trouble with constipation.   OB History    Gravida Para Term Preterm AB Living   4 2 2  0 1 2   SAB TAB Ectopic Multiple Live Births   0 1 0 0 2      Past Medical History:  Diagnosis Date  . Anemia   . Anxiety   . Chlamydia   . Complication of anesthesia    ??, seizure with wisdom teeth  . Depression   . Migraine   . PID (acute pelvic inflammatory disease) 2014  . PONV (postoperative nausea and vomiting)   . Psoriasis   . Seizures [facility])    July 2013 - [place]   Past Surgical History:  Procedure Laterality Date  . CESAREAN SECTION    . CESAREAN SECTION  06/06/2012   Procedure: CESAREAN SECTION;  Surgeon: Adam Phenix, MD;  Location: [facility];  Service: Obstetrics;  Laterality: N/A;  . SKIN GRAFT     off abd, onto arm  . WISDOM TOOTH EXTRACTION      Family History  Problem Relation Age of Onset  . Hypertension Mother   . Diabetes Mother   . Cancer Mother        BREAST  . Stroke Mother   . Seizures Mother   . Asthma Daughter   . Hypertension Maternal Grandmother   . Diabetes Maternal Grandmother   . Cancer Maternal Grandmother        BONE CANCER  . Other Neg Hx     Social History  Substance Use Topics  . Smoking status: Never Smoker  . Smokeless tobacco: Never Used  . Alcohol use No     Comment: socially but none with pregnancy    Allergies: No Known Allergies  Prescriptions Prior to Admission   Medication Sig Dispense Refill Last Dose  . cyclobenzaprine (FLEXERIL) 10 MG tablet Take 1 tablet (10 mg total) by mouth every 8 (eight) hours as needed for muscle spasms. (Patient not taking: Reported on 12/01/2016) 30 tablet 1 Not Taking at Unknown time  . ondansetron (ZOFRAN ODT) 4 MG disintegrating tablet Take 1 tablet (4 mg total) by mouth every 6 (six) hours as needed for nausea. (Patient not taking: Reported on 12/01/2016) 20 tablet 0 Not Taking at Unknown time  . Prenatal Vit-Fe Fumarate-FA (PRENATAL COMPLETE) 14-0.4 MG TABS Take 1 tablet by mouth daily. (Patient not taking: Reported on 08/19/2016) 60 each 0 Not Taking at Unknown time  . promethazine (PHENERGAN) 25 MG tablet Take 0.5-1 tablets (12.5-25 mg total) by mouth every 6 (six) hours as needed. (Patient not taking: Reported on 09/15/2016) 30 tablet 3 Not Taking at Unknown time  . ranitidine (ZANTAC) 150 MG tablet Take 1 tablet (150 mg total) by mouth 2 (two) times daily. (Patient not taking: Reported on 11/11/2016) 60 tablet 0 Not Taking  .  sertraline (ZOLOFT) 50 MG tablet Take 1/2 tab po once a day for 7 days, then take 1 tab po qd (Patient taking differently: Take 50 mg by mouth daily. Take 1/2 tab po once a day for 7 days, then take 1 tab po qd) 30 tablet 2 11/07/2016 at Unknown time   Results for orders placed or performed during the hospital encounter of 12/01/16 (from the past 48 hour(s))  Urinalysis, Routine w reflex microscopic     Status: None   Collection Time: 12/01/16  9:15 AM  Result Value Ref Range   Color, Urine YELLOW YELLOW   APPearance CLEAR CLEAR   Specific Gravity, Urine 1.020 1.005 - 1.030   pH 7.0 5.0 - 8.0   Glucose, UA NEGATIVE NEGATIVE mg/dL   Hgb urine dipstick NEGATIVE NEGATIVE   Bilirubin Urine NEGATIVE NEGATIVE   Ketones, ur NEGATIVE NEGATIVE mg/dL   Protein, ur NEGATIVE NEGATIVE mg/dL   Nitrite NEGATIVE NEGATIVE   Leukocytes, UA NEGATIVE NEGATIVE  Wet prep, genital     Status: Abnormal   Collection Time:  12/01/16 10:00 AM  Result Value Ref Range   Yeast Wet Prep HPF POC NONE SEEN NONE SEEN   Trich, Wet Prep NONE SEEN NONE SEEN   Clue Cells Wet Prep HPF POC NONE SEEN NONE SEEN   WBC, Wet Prep HPF POC MODERATE (A) NONE SEEN    Comment: MANY BACTERIA SEEN   Sperm NONE SEEN    Review of Systems  Constitutional: Negative for fever.  Gastrointestinal: Positive for constipation.  Genitourinary: Positive for pelvic pain and vaginal discharge. Negative for difficulty urinating, dysuria and vaginal bleeding.   Physical Exam   Blood pressure (!) 124/51, pulse 95, temperature 97.9 F (36.6 C), temperature source Oral, resp. rate 18, height 5\' 6"  (1.676 m), weight 238 lb 4 oz (108.1 kg), last menstrual period 07/04/2016, SpO2 100 %.  Physical Exam  Constitutional: She is oriented to person, place, and time. She appears well-developed and well-nourished. No distress.  HENT:  Head: Normocephalic.  GI: Soft. Bowel sounds are normal. She exhibits no distension. There is no tenderness. There is no rebound and no guarding.  Genitourinary:  Genitourinary Comments: Cervix: closed, thick, posterior   Musculoskeletal: Normal range of motion.  Neurological: She is alert and oriented to person, place, and time.  Skin: Skin is warm. She is not diaphoretic.  Psychiatric: Her behavior is normal.    MAU Course  Procedures  None  MDM  + fetal hearts tones via doppler  Wet prep & GC  UA Ibuprofen 600 mg PO Pain down to 0/10 following following ibuprofen.    Assessment and Plan   A:  1. Constipation during pregnancy in second trimester   2. Round ligament pain     P:  Discharge home in stable condition Pregnancy support belt recommended  Miralax and colace encouraged as directed on the bottle Increase PO fluid intake  Resent flexeril RX Return to MAU if symptoms worsen    Lavinia Mcneely, Harolyn RutherfordJennifer I, NP 12/01/2016 4:55 PM

## 2016-12-09 ENCOUNTER — Ambulatory Visit (INDEPENDENT_AMBULATORY_CARE_PROVIDER_SITE_OTHER): Payer: Medicaid Other | Admitting: Obstetrics and Gynecology

## 2016-12-09 VITALS — BP 109/72 | HR 91 | Wt 236.6 lb

## 2016-12-09 DIAGNOSIS — Z98891 History of uterine scar from previous surgery: Secondary | ICD-10-CM

## 2016-12-09 DIAGNOSIS — Z3482 Encounter for supervision of other normal pregnancy, second trimester: Secondary | ICD-10-CM

## 2016-12-09 DIAGNOSIS — O34219 Maternal care for unspecified type scar from previous cesarean delivery: Secondary | ICD-10-CM

## 2016-12-09 NOTE — Progress Notes (Signed)
   PRENATAL VISIT NOTE  Subjective:  Chloe ParrShakilla Estill is a 26 y.o. [phone] at 3970w4d being seen today for ongoing prenatal care.  She is currently monitored for the following issues for this low-risk pregnancy and has Seizure disorder (HCC); Family history of breast cancer in mother; Previous cesarean delivery, antepartum condition or complication; Obese; Migraines; Chlamydia infection affecting pregnancy in first trimester; and Supervision of normal pregnancy on her problem list.  Patient reports round ligment/pelvic girdle discomfort and difficulty sleeping.  Contractions: Not present.  .  Movement: Present. Denies leaking of fluid.   The following portions of the patient's history were reviewed and updated as appropriate: allergies, current medications, past family history, past medical history, past social history, past surgical history and problem list. Problem list updated.  Objective:   Vitals:   12/09/16 1122  BP: 109/72  Pulse: 91  Weight: 107.3 kg (236 lb 9.6 oz)    Fetal Status: Fetal Heart Rate (bpm): 164   Movement: Present     General:  Alert, oriented and cooperative. Patient is in no acute distress.  Skin: Skin is warm and dry. No rash noted.   Cardiovascular: Normal heart rate noted  Respiratory: Normal respiratory effort, no problems with respiration noted  Abdomen: Soft, gravid, appropriate for gestational age. Pain/Pressure: Absent     Pelvic:  Cervical exam deferred        Extremities: Normal range of motion.  Edema: None  Mental Status: Normal mood and affect. Normal behavior. Normal judgment and thought content.   Assessment and Plan:  Pregnancy: [id number] at 2370w4d 1. Encounter for supervision of other normal pregnancy in second trimester   2. Previous cesarean section   C/S x2> for repeat RLP relief measures and sleep aids reviewed   Preterm labor symptoms and general obstetric precautions including but not limited to vaginal bleeding, contractions, leaking of  fluid and fetal movement were reviewed in detail with the patient. Please refer to After Visit Summary for other counseling recommendations.  Return in about 4 weeks (around 01/06/2017).   Caren Griffinseirdre Truth Wolaver, CNM

## 2016-12-09 NOTE — Patient Instructions (Signed)

## 2016-12-11 ENCOUNTER — Emergency Department (HOSPITAL_COMMUNITY)
Admission: EM | Admit: 2016-12-11 | Discharge: 2016-12-11 | Disposition: A | Discharge status: Home / Self Care | Payer: Medicaid Other | Attending: Emergency Medicine | Admitting: Emergency Medicine

## 2016-12-11 ENCOUNTER — Encounter (HOSPITAL_COMMUNITY): Payer: Self-pay

## 2016-12-11 DIAGNOSIS — Y939 Activity, unspecified: Secondary | ICD-10-CM | POA: Insufficient documentation

## 2016-12-11 DIAGNOSIS — Y929 Unspecified place or not applicable: Secondary | ICD-10-CM | POA: Insufficient documentation

## 2016-12-11 DIAGNOSIS — Z3A22 22 weeks gestation of pregnancy: Secondary | ICD-10-CM | POA: Insufficient documentation

## 2016-12-11 DIAGNOSIS — Y999 Unspecified external cause status: Secondary | ICD-10-CM | POA: Diagnosis not present

## 2016-12-11 DIAGNOSIS — R51 Headache: Secondary | ICD-10-CM | POA: Diagnosis not present

## 2016-12-11 DIAGNOSIS — R110 Nausea: Secondary | ICD-10-CM | POA: Insufficient documentation

## 2016-12-11 DIAGNOSIS — R109 Unspecified abdominal pain: Secondary | ICD-10-CM | POA: Insufficient documentation

## 2016-12-11 MED ORDER — ACETAMINOPHEN 325 MG PO TABS
650.0000 mg | ORAL_TABLET | Freq: Once | ORAL | Status: AC
  Administered 2016-12-11: 650 mg via ORAL
  Filled 2016-12-11: qty 2
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
## 2016-12-11 NOTE — ED Notes (Signed)
OB rapid response at bedside 
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
## 2016-12-11 NOTE — Discharge Instructions (Signed)
[facility]  Return to ED for worsening headache, additional injury, vision changes, dizziness, vomiting, trouble walking, numbness or weakness.

## 2016-12-20 ENCOUNTER — Encounter: Payer: Medicaid Other | Admitting: Obstetrics & Gynecology

## 2017-01-05 ENCOUNTER — Other Ambulatory Visit (HOSPITAL_COMMUNITY)
Admission: RE | Admit: 2017-01-05 | Discharge: 2017-01-05 | Disposition: A | Discharge status: Home / Self Care | Payer: Medicaid Other | Source: Ambulatory Visit | Attending: Student | Admitting: Student

## 2017-01-05 ENCOUNTER — Encounter: Payer: Self-pay | Admitting: Student

## 2017-01-05 ENCOUNTER — Ambulatory Visit (INDEPENDENT_AMBULATORY_CARE_PROVIDER_SITE_OTHER): Payer: Medicaid Other | Admitting: Student

## 2017-01-05 VITALS — BP 112/75 | HR 91 | Wt 237.5 lb

## 2017-01-05 DIAGNOSIS — Z3A00 Weeks of gestation of pregnancy not specified: Secondary | ICD-10-CM | POA: Diagnosis not present

## 2017-01-05 DIAGNOSIS — F329 Major depressive disorder, single episode, unspecified: Secondary | ICD-10-CM | POA: Diagnosis not present

## 2017-01-05 DIAGNOSIS — Z23 Encounter for immunization: Secondary | ICD-10-CM | POA: Diagnosis not present

## 2017-01-05 DIAGNOSIS — F32A Depression, unspecified: Secondary | ICD-10-CM

## 2017-01-05 DIAGNOSIS — Z3482 Encounter for supervision of other normal pregnancy, second trimester: Secondary | ICD-10-CM | POA: Diagnosis present

## 2017-01-05 DIAGNOSIS — O26892 Other specified pregnancy related conditions, second trimester: Secondary | ICD-10-CM

## 2017-01-05 DIAGNOSIS — Z113 Encounter for screening for infections with a predominantly sexual mode of transmission: Secondary | ICD-10-CM

## 2017-01-05 DIAGNOSIS — R102 Pelvic and perineal pain: Secondary | ICD-10-CM | POA: Diagnosis not present

## 2017-01-05 DIAGNOSIS — O26899 Other specified pregnancy related conditions, unspecified trimester: Secondary | ICD-10-CM

## 2017-01-05 DIAGNOSIS — N898 Other specified noninflammatory disorders of vagina: Secondary | ICD-10-CM | POA: Insufficient documentation

## 2017-01-05 DIAGNOSIS — O99342 Other mental disorders complicating pregnancy, second trimester: Secondary | ICD-10-CM | POA: Insufficient documentation

## 2017-01-05 MED ORDER — PREPLUS 27-1 MG PO TABS: 1.0000 | ORAL_TABLET | Freq: Every day | ORAL | 13 refills | Status: DC

## 2017-01-05 MED ORDER — COMFORT FIT MATERNITY SUPP LG MISC: 1.0000 [IU] | Freq: Every day | 0 refills | Status: DC

## 2017-01-05 NOTE — Progress Notes (Signed)
Patient complain of lots of white discharge with odor, itching and irritation patient noticed this for 2 days.Patient complains of being depressed and needing to talk to someone.
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
## 2017-01-05 NOTE — Patient Instructions (Addendum)
Major Depressive Disorder, Adult Major depressive disorder (MDD) is a mental health condition. It may also be called clinical depression or unipolar depression. MDD usually causes feelings of sadness, hopelessness, or helplessness. MDD can also cause physical symptoms. It can interfere with work, school, relationships, and other everyday activities. MDD may be mild, moderate, or severe. It may occur once (single episode major depressive disorder) or it may occur multiple times (recurrent major depressive disorder). What are the causes? The exact cause of this condition is not known. MDD is most likely caused by a combination of things, which may include:  Genetic factors. These are traits that are passed along from parent to child.  Individual factors. Your personality, your behavior, and the way you handle your thoughts and feelings may contribute to MDD. This includes personality traits and behaviors learned from others.  Physical factors, such as: ? Differences in the part of your brain that controls emotion. This part of your brain may be different than it is in people who do not have MDD. ? Long-term (chronic) medical or psychiatric illnesses.  Social factors. Traumatic experiences or major life changes may play a role in the development of MDD.  What increases the risk? This condition is more likely to develop in women. The following factors may also make you more likely to develop MDD:  A family history of depression.  Troubled family relationships.  Abnormally low levels of certain brain chemicals.  Traumatic events in childhood, especially abuse or the loss of a parent.  Being under a lot of stress, or long-term stress, especially from upsetting life experiences or losses.  A history of: ? Chronic physical illness. ? Other mental health disorders. ? Substance abuse.  Poor living conditions.  Experiencing social exclusion or discrimination on a regular basis.  What are  the signs or symptoms? The main symptoms of MDD typically include:  Constant depressed or irritable mood.  Loss of interest in things and activities.  MDD symptoms may also include:  Sleeping or eating too much or too little.  Unexplained weight change.  Fatigue or low energy.  Feelings of worthlessness or guilt.  Difficulty thinking clearly or making decisions.  Thoughts of suicide or of harming others.  Physical agitation or weakness.  Isolation.  Severe cases of MDD may also occur with other symptoms, such as:  Delusions or hallucinations, in which you imagine things that are not real (psychotic depression).  Low-level depression that lasts at least a year (chronic depression or persistent depressive disorder).  Extreme sadness and hopelessness (melancholic depression).  Trouble speaking and moving (catatonic depression).  How is this diagnosed? This condition may be diagnosed based on:  Your symptoms.  Your medical history, including your mental health history. This may involve tests to evaluate your mental health. You may be asked questions about your lifestyle, including any drug and alcohol use, and how long you have had symptoms of MDD.  A physical exam.  Blood tests to rule out other conditions.  You must have a depressed mood and at least four other MDD symptoms most of the day, nearly every day in the same 2-week timeframe before your health care provider can confirm a diagnosis of MDD. How is this treated? This condition is usually treated by mental health professionals, such as psychologists, psychiatrists, and clinical social workers. You may need more than one type of treatment. Treatment may include:  Psychotherapy. This is also called talk therapy or counseling. Types of psychotherapy include: ? Cognitive behavioral   therapy (CBT). This type of therapy teaches you to recognize unhealthy feelings, thoughts, and behaviors, and replace them with  positive thoughts and actions. ? Interpersonal therapy (IPT). This helps you to improve the way you relate to and communicate with others. ? Family therapy. This treatment includes members of your family.  Medicine to treat anxiety and depression, or to help you control certain emotions and behaviors.  Lifestyle changes, such as: ? Limiting alcohol and drug use. ? Exercising regularly. ? Getting plenty of sleep. ? Making healthy eating choices. ? Spending more time outdoors.  Treatments involving stimulation of the brain can be used in situations with extremely severe symptoms, or when medicine or other therapies do not work over time. These treatments include electroconvulsive therapy, transcranial magnetic stimulation, and vagal nerve stimulation. Follow these instructions at home: Activity  Return to your normal activities as told by your health care provider.  Exercise regularly and spend time outdoors as told by your health care provider. General instructions  Take over-the-counter and prescription medicines only as told by your health care provider.  Do not drink alcohol. If you drink alcohol, limit your alcohol intake to no more than 1 drink a day for nonpregnant women and 2 drinks a day for men. One drink equals 12 oz of beer, 5 oz of wine, or 1 oz of hard liquor. Alcohol can affect any antidepressant medicines you are taking. Talk to your health care provider about your alcohol use.  Eat a healthy diet and get plenty of sleep.  Find activities that you enjoy doing, and make time to do them.  Consider joining a support group. Your health care provider may be able to recommend a support group.  Keep all follow-up visits as told by your health care provider. This is important. Where to find more information: [employer] on Mental Illness  www.nami.org  [employer] of Mental Health  http://www.maynard.net/www.nimh.nih.gov  National Suicide Prevention  Lifeline  1-800-273-TALK (289)887-1402(8255). This is free, 24-hour help.  Contact a health care provider if:  Your symptoms get worse.  You develop new symptoms. Get help right away if:  You self-harm.  You have serious thoughts about hurting yourself or others.  You see, hear, taste, smell, or feel things that are not present (hallucinate). This information is not intended to replace advice given to you by your health care provider. Make sure you discuss any questions you have with your health care provider. Document Released: 10/23/2012 Document Revised: 03/04/2016 Document Reviewed: 01/07/2016 Elsevier Interactive Patient Education  2017 [employer] Inc. Round Ligament Pain The round ligament is a cord of muscle and tissue that helps to support the uterus. It can become a source of pain during pregnancy if it becomes stretched or twisted as the baby grows. The pain usually begins in the second trimester of pregnancy, and it can come and go until the baby is delivered. It is not a serious problem, and it does not cause harm to the baby. Round ligament pain is usually a short, sharp, and pinching pain, but it can also be a dull, lingering, and aching pain. The pain is felt in the lower side of the abdomen or in the groin. It usually starts deep in the groin and moves up to the outside of the hip area. Pain can occur with:  A sudden change in position.  Rolling over in bed.  Coughing or sneezing.  Physical activity.  Follow these instructions at home: Watch your condition for any changes.  Take these steps to help with your pain:  When the pain starts, relax. Then try: ? Sitting down. ? Flexing your knees up to your abdomen. ? Lying on your side with one pillow under your abdomen and another pillow between your legs. ? Sitting in a warm bath for 15-20 minutes or until the pain goes away.  Take over-the-counter and prescription medicines only as told by your health care provider.  Move  slowly when you sit and stand.  Avoid long walks if they cause pain.  Stop or lessen your physical activities if they cause pain.  Contact a health care provider if:  Your pain does not go away with treatment.  You feel pain in your back that you did not have before.  Your medicine is not helping. Get help right away if:  You develop a fever or chills.  You develop uterine contractions.  You develop vaginal bleeding.  You develop nausea or vomiting.  You develop diarrhea.  You have pain when you urinate. This information is not intended to replace advice given to you by your health care provider. Make sure you discuss any questions you have with your health care provider. Document Released: 04/06/2008 Document Revised: 12/04/2015 Document Reviewed: 09/04/2014 Elsevier Interactive Patient Education  [employer].

## 2017-01-06 LAB — CERVICOVAGINAL ANCILLARY ONLY
Bacterial vaginitis: NEGATIVE
Candida vaginitis: NEGATIVE
Trichomonas: NEGATIVE

## 2017-01-06 LAB — GLUCOSE TOLERANCE, 2 HOURS W/ 1HR
Glucose, 1 hour: 160 mg/dL (ref 65–179)
Glucose, 2 hour: 121 mg/dL (ref 65–152)
Glucose, Fasting: 90 mg/dL (ref 65–91)

## 2017-01-06 LAB — CBC
Hematocrit: 34.7 % (ref 34.0–46.6)
Hemoglobin: 11.5 g/dL (ref 11.1–15.9)
MCH: 28.1 pg (ref 26.6–33.0)
MCHC: 33.1 g/dL (ref 31.5–35.7)
MCV: 85 fL (ref 79–97)
Platelets: 160 10*3/uL (ref 150–379)
RBC: 4.09 x10E6/uL (ref 3.77–5.28)
RDW: 14.2 % (ref 12.3–15.4)
WBC: 7.2 10*3/uL (ref 3.4–10.8)

## 2017-01-06 LAB — HIV ANTIBODY (ROUTINE TESTING W REFLEX): HIV Screen 4th Generation wRfx: NONREACTIVE

## 2017-01-06 LAB — RPR: RPR Ser Ql: NONREACTIVE

## 2017-01-11 ENCOUNTER — Institutional Professional Consult (permissible substitution): Payer: Medicaid Other

## 2017-01-11 NOTE — BH Specialist Note (Deleted)
Integrated Behavioral Health Initial Visit  MRN: [id number] Name: Chloe Rodgers   Session Start time: *** Session End time: *** Total time: {IBH Total Time:21014050}  Type of Service: Integrated Behavioral Health- Individual/Family Interpretor:No. Interpretor Name and Language: n/a   Warm Hand Off Completed.       SUBJECTIVE: Chloe Rodgers is a 26 y.o. female accompanied by {Persons; PED relatives w/patient:19415}. Patient was referred by Judeth HornErin Lawrence, NP for depression. Patient reports the following symptoms/concerns: *** Duration of problem: ***; Severity of problem: {Mild/Moderate/Severe:20260}  OBJECTIVE: Mood: {BHH MOOD:22306} and Affect: {BHH AFFECT:22307} Risk of harm to self or others: {CHL AMB BH Suicide Current Mental Status:21022748}   LIFE CONTEXT: Family and Social: *** School/Work: *** Self-Care: *** Life Changes: ***  GOALS ADDRESSED: Patient will reduce symptoms of: {IBH Symptoms:21014056} and increase knowledge and/or ability of: {IBH Patient Tools:21014057} and also: {IBH Goals:21014053}   INTERVENTIONS: {IBH Interventions:21014054}  Standardized Assessments completed: {IBH Screening Tools:21014051}  ASSESSMENT: Patient currently experiencing ***. Patient may benefit from ***.  PLAN: 1. Follow up with behavioral health clinician on : *** 2. Behavioral recommendations: *** 3. Referral(s): {IBH Referrals:21014055} 4. "From scale of 1-10, how likely are you to follow plan?": ***  Jamie C McMannes, LCSWA

## 2017-01-27 ENCOUNTER — Encounter (HOSPITAL_COMMUNITY): Payer: Self-pay | Admitting: *Deleted

## 2017-01-27 ENCOUNTER — Inpatient Hospital Stay (HOSPITAL_COMMUNITY)
Admission: AD | Admit: 2017-01-27 | Discharge: 2017-01-27 | Disposition: A | Discharge status: Home / Self Care | Payer: Medicaid Other | Source: Ambulatory Visit | Attending: Obstetrics and Gynecology | Admitting: Obstetrics and Gynecology

## 2017-01-27 DIAGNOSIS — O368130 Decreased fetal movements, third trimester, not applicable or unspecified: Secondary | ICD-10-CM | POA: Diagnosis not present

## 2017-01-27 DIAGNOSIS — O99342 Other mental disorders complicating pregnancy, second trimester: Secondary | ICD-10-CM

## 2017-01-27 DIAGNOSIS — F32A Depression, unspecified: Secondary | ICD-10-CM

## 2017-01-27 DIAGNOSIS — O23593 Infection of other part of genital tract in pregnancy, third trimester: Secondary | ICD-10-CM | POA: Diagnosis not present

## 2017-01-27 DIAGNOSIS — Z3A29 29 weeks gestation of pregnancy: Secondary | ICD-10-CM

## 2017-01-27 DIAGNOSIS — O99343 Other mental disorders complicating pregnancy, third trimester: Secondary | ICD-10-CM | POA: Insufficient documentation

## 2017-01-27 DIAGNOSIS — F329 Major depressive disorder, single episode, unspecified: Secondary | ICD-10-CM | POA: Insufficient documentation

## 2017-01-27 DIAGNOSIS — O26899 Other specified pregnancy related conditions, unspecified trimester: Secondary | ICD-10-CM

## 2017-01-27 DIAGNOSIS — O26893 Other specified pregnancy related conditions, third trimester: Secondary | ICD-10-CM | POA: Diagnosis not present

## 2017-01-27 DIAGNOSIS — R102 Pelvic and perineal pain: Secondary | ICD-10-CM

## 2017-01-27 DIAGNOSIS — B9689 Other specified bacterial agents as the cause of diseases classified elsewhere: Secondary | ICD-10-CM | POA: Diagnosis not present

## 2017-01-27 DIAGNOSIS — K529 Noninfective gastroenteritis and colitis, unspecified: Secondary | ICD-10-CM

## 2017-01-27 DIAGNOSIS — Z3689 Encounter for other specified antenatal screening: Secondary | ICD-10-CM

## 2017-01-27 DIAGNOSIS — Z3482 Encounter for supervision of other normal pregnancy, second trimester: Secondary | ICD-10-CM

## 2017-01-27 DIAGNOSIS — N760 Acute vaginitis: Secondary | ICD-10-CM

## 2017-01-27 LAB — COMPREHENSIVE METABOLIC PANEL
ALT: 16 U/L (ref 14–54)
AST: 16 U/L (ref 15–41)
Albumin: 2.9 g/dL — ABNORMAL LOW (ref 3.5–5.0)
Alkaline Phosphatase: 70 U/L (ref 38–126)
Anion gap: 9 (ref 5–15)
BUN: 12 mg/dL (ref 6–20)
CO2: 20 mmol/L — ABNORMAL LOW (ref 22–32)
Calcium: 9 mg/dL (ref 8.9–10.3)
Chloride: 107 mmol/L (ref 101–111)
Creatinine, Ser: 0.72 mg/dL (ref 0.44–1.00)
GFR calc Af Amer: >60 mL/min (ref >60)
GFR calc non Af Amer: >60 mL/min (ref >60)
Glucose, Bld: 93 mg/dL (ref 65–99)
Potassium: 3.6 mmol/L (ref 3.5–5.1)
Sodium: 136 mmol/L (ref 135–145)
Total Bilirubin: 0.3 mg/dL (ref 0.3–1.2)
Total Protein: 6.8 g/dL (ref 6.5–8.1)

## 2017-01-27 LAB — CBC
HCT: 30.5 % — ABNORMAL LOW (ref 36.0–46.0)
Hemoglobin: 10.3 g/dL — ABNORMAL LOW (ref 12.0–15.0)
MCH: 27.9 pg (ref 26.0–34.0)
MCHC: 33.8 g/dL (ref 30.0–36.0)
MCV: 82.7 fL (ref 78.0–100.0)
Platelets: 152 10*3/uL (ref 150–400)
RBC: 3.69 MIL/uL — ABNORMAL LOW (ref 3.87–5.11)
RDW: 13.9 % (ref 11.5–15.5)
WBC: 8.2 10*3/uL (ref 4.0–10.5)

## 2017-01-27 LAB — URINALYSIS, ROUTINE W REFLEX MICROSCOPIC
Bilirubin Urine: NEGATIVE
Glucose, UA: NEGATIVE mg/dL
Hgb urine dipstick: NEGATIVE
Ketones, ur: NEGATIVE mg/dL
Nitrite: NEGATIVE
Protein, ur: NEGATIVE mg/dL
Specific Gravity, Urine: 1.021 (ref 1.005–1.030)
pH: 6 (ref 5.0–8.0)

## 2017-01-27 LAB — WET PREP, GENITAL
Sperm: NONE SEEN
Trich, Wet Prep: NONE SEEN
Yeast Wet Prep HPF POC: NONE SEEN

## 2017-01-27 MED ORDER — METRONIDAZOLE 500 MG PO TABS: 500.0000 mg | ORAL_TABLET | Freq: Two times a day (BID) | ORAL | 0 refills | Status: DC

## 2017-01-27 MED ORDER — SODIUM CHLORIDE 0.9 % IV SOLN
8.0000 mg | Freq: Once | INTRAVENOUS | Status: AC
  Administered 2017-01-27: 8 mg via INTRAVENOUS
  Filled 2017-01-27: qty 4

## 2017-01-27 MED ORDER — ONDANSETRON 4 MG PO TBDP: 4.0000 mg | ORAL_TABLET | Freq: Three times a day (TID) | ORAL | 0 refills | Status: DC | PRN

## 2017-01-27 MED ORDER — LACTATED RINGERS IV BOLUS (SEPSIS)
1000.0000 mL | Freq: Once | INTRAVENOUS | Status: AC
  Administered 2017-01-27: 1000 mL via INTRAVENOUS

## 2017-01-27 MED ORDER — COMFORT FIT MATERNITY SUPP LG MISC: 1.0000 [IU] | Freq: Every day | 0 refills | Status: DC

## 2017-01-27 NOTE — MAU Provider Note (Signed)
History     CSN: [id number]  Arrival date and time: 01/27/17 1537    Chief Complaint  Patient presents with  . Pelvic Pain   G4P2012 @29 .4 wks here with N/V/D, pelvic pain and pressure, and no fetal movement. N/V/D started 3 days ago. She is vomiting several times a day and 3-4 episodes of diarrhea daily. She cannot tolerate food or fluids. No fevers but some chills. No sick contacts. Pelvic pain and pressure started about 3 days ago. It's worse with standing. She reports thick white mucusy discharge for the last 3 days. Malodorous at times. No itching. Not sexually active since December. No FM since last night.     OB History    Gravida Para Term Preterm AB Living   4 2 2  0 1 2   SAB TAB Ectopic Multiple Live Births   0 1 0 0 2      Past Medical History:  Diagnosis Date  . Anemia   . Anxiety   . Chlamydia   . Complication of anesthesia    ??, seizure with wisdom teeth  . Depression   . Migraine   . PID (acute pelvic inflammatory disease) 2014  . PONV (postoperative nausea and vomiting)   . Psoriasis   . Seizures [facility])    July 2013 - [facility] [place] [place]    Past Surgical History:  Procedure Laterality Date  . CESAREAN SECTION    . CESAREAN SECTION  06/06/2012   Procedure: CESAREAN SECTION;  Surgeon: Adam Phenix, MD;  Location: [facility] ORS;  Service: Obstetrics;  Laterality: N/A;  . SKIN GRAFT     off abd, onto arm  . WISDOM TOOTH EXTRACTION      Family History  Problem Relation Age of Onset  . Hypertension Mother   . Diabetes Mother   . Cancer Mother        BREAST  . Stroke Mother   . Seizures Mother   . Asthma Daughter   . Hypertension Maternal Grandmother   . Diabetes Maternal Grandmother   . Cancer Maternal Grandmother        BONE CANCER  . Other Neg Hx     Social History  Substance Use Topics  . Smoking status: Never Smoker  . Smokeless tobacco: Never Used  . Alcohol use No     Comment: socially but none with pregnancy    Allergies: No Known  Allergies  Prescriptions Prior to Admission  Medication Sig Dispense Refill Last Dose  . cyclobenzaprine (FLEXERIL) 10 MG tablet Take 1 tablet (10 mg total) by mouth every 8 (eight) hours as needed for muscle spasms. (Patient not taking: Reported on 01/05/2017) 30 tablet 1 Not Taking  . Elastic Bandages & Supports (COMFORT FIT MATERNITY SUPP LG) MISC 1 Units by Does not apply route daily. 1 each 0   . ondansetron (ZOFRAN ODT) 4 MG disintegrating tablet Take 1 tablet (4 mg total) by mouth every 6 (six) hours as needed for nausea. (Patient not taking: Reported on 12/01/2016) 20 tablet 0 Not Taking  . Prenatal Vit-Fe Fumarate-FA (PREPLUS) 27-1 MG TABS Take 1 tablet by mouth daily. 30 tablet 13   . promethazine (PHENERGAN) 25 MG tablet Take 0.5-1 tablets (12.5-25 mg total) by mouth every 6 (six) hours as needed. (Patient not taking: Reported on 09/15/2016) 30 tablet 3 Not Taking  . ranitidine (ZANTAC) 150 MG tablet Take 1 tablet (150 mg total) by mouth 2 (two) times daily. (Patient not taking: Reported on 11/11/2016) 60 tablet 0 Not  Taking  . sertraline (ZOLOFT) 50 MG tablet Take 1/2 tab po once a day for 7 days, then take 1 tab po qd (Patient not taking: Reported on 12/11/2016) 30 tablet 2 Not Taking    Review of Systems  Gastrointestinal: Positive for diarrhea, nausea and vomiting.  Genitourinary: Positive for pelvic pain and vaginal discharge. Negative for dysuria and vaginal bleeding.   Physical Exam   Blood pressure 106/68, pulse (!) 121, temperature (!) 97.5 F (36.4 C), resp. rate 18, height 5\' 6"  (1.676 m), weight 241 lb (109.3 kg), last menstrual period 07/04/2016, SpO2 98 %.  Physical Exam  Nursing note and vitals reviewed. Constitutional: She is oriented to person, place, and time. She appears well-developed and well-nourished. No distress.  HENT:  Head: Normocephalic and atraumatic.  Neck: Normal range of motion. Neck supple.  Respiratory: Effort normal. No respiratory distress.  GI:  Soft. She exhibits no distension. There is tenderness in the suprapubic area. There is no rebound and no guarding.  Genitourinary:  Genitourinary Comments: External: no lesions or erythema Vagina: rugated, pink, moist, moderate malodorous light yellow discharge Cervix closed/thick  Musculoskeletal: Normal range of motion.  Neurological: She is alert and oriented to person, place, and time.  Skin: Skin is warm and dry.  Psychiatric: She has a normal mood and affect.  EFM: 165 bpm, mod variability, + accels, no decels Toco: none  Results for orders placed or performed during the hospital encounter of 01/27/17 (from the past 24 hour(s))  Urinalysis, Routine w reflex microscopic     Status: Abnormal   Collection Time: 01/27/17  3:52 PM  Result Value Ref Range   Color, Urine YELLOW YELLOW   APPearance CLOUDY (A) CLEAR   Specific Gravity, Urine 1.021 1.005 - 1.030   pH 6.0 5.0 - 8.0   Glucose, UA NEGATIVE NEGATIVE mg/dL   Hgb urine dipstick NEGATIVE NEGATIVE   Bilirubin Urine NEGATIVE NEGATIVE   Ketones, ur NEGATIVE NEGATIVE mg/dL   Protein, ur NEGATIVE NEGATIVE mg/dL   Nitrite NEGATIVE NEGATIVE   Leukocytes, UA LARGE (A) NEGATIVE   RBC / HPF 0-5 0 - 5 RBC/hpf   WBC, UA 6-30 0 - 5 WBC/hpf   Bacteria, UA RARE (A) NONE SEEN   Squamous Epithelial / LPF 6-30 (A) NONE SEEN   Mucous PRESENT   Wet prep, genital     Status: Abnormal   Collection Time: 01/27/17  4:25 PM  Result Value Ref Range   Yeast Wet Prep HPF POC NONE SEEN NONE SEEN   Trich, Wet Prep NONE SEEN NONE SEEN   Clue Cells Wet Prep HPF POC PRESENT (A) NONE SEEN   WBC, Wet Prep HPF POC FEW (A) NONE SEEN   Sperm NONE SEEN   CBC     Status: Abnormal   Collection Time: 01/27/17  4:32 PM  Result Value Ref Range   WBC 8.2 4.0 - 10.5 K/uL   RBC 3.69 (L) 3.87 - 5.11 MIL/uL   Hemoglobin 10.3 (L) 12.0 - 15.0 g/dL   HCT 09.830.5 (L) 11.936.0 - 14.746.0 %   MCV 82.7 78.0 - 100.0 fL   MCH 27.9 26.0 - 34.0 pg   MCHC 33.8 30.0 - 36.0 g/dL    RDW 82.913.9 56.211.5 - 13.015.5 %   Platelets 152 150 - 400 K/uL  Comprehensive metabolic panel     Status: Abnormal   Collection Time: 01/27/17  4:32 PM  Result Value Ref Range   Sodium 136 135 - 145 mmol/L   Potassium  3.6 3.5 - 5.1 mmol/L   Chloride 107 101 - 111 mmol/L   CO2 20 (L) 22 - 32 mmol/L   Glucose, Bld 93 65 - 99 mg/dL   BUN 12 6 - 20 mg/dL   Creatinine, Ser 1.61 0.44 - 1.00 mg/dL   Calcium 9.0 8.9 - 09.6 mg/dL   Total Protein 6.8 6.5 - 8.1 g/dL   Albumin 2.9 (L) 3.5 - 5.0 g/dL   AST 16 15 - 41 U/L   ALT 16 14 - 54 U/L   Alkaline Phosphatase 70 38 - 126 U/L   Total Bilirubin 0.3 0.3 - 1.2 mg/dL   GFR calc non Af Amer >60 >60 mL/min   GFR calc Af Amer >60 >60 mL/min   Anion gap 9 5 - 15   MAU Course  Procedures LR 1 L bolus Zofran  MDM Labs ordered and reviewed. FHR initially high normal baseline then returned to 155. Reports +FM (and audible) and improved nausea. Tolerating po w/o emesis. No evidence of PTL or UTI. Will treat BV. Pelvic pain/pressure likely physiologic to advancing pregnancy, recommend maternity support belt. Stable for discharge home.   Assessment and Plan   1. [redacted] weeks gestation of pregnancy   2. Depression affecting pregnancy in second trimester, antepartum   3. Encounter for supervision of other normal pregnancy in second trimester   4. NST (non-stress test) reactive   5. Pelvic pain affecting pregnancy in third trimester, antepartum   6. Bacterial vaginosis   7. Pain of round ligament during pregnancy   8. Decreased fetal movements in third trimester, single or unspecified fetus   9. Gastroenteritis    Discharge home Rx Zofran Rx Maternity support belt Rx Flagyl PTL/return precautions Follow up in OB office next week  Allergies as of 01/27/2017   No Known Allergies     Medication List    STOP taking these medications   promethazine 25 MG tablet Commonly known as:  PHENERGAN     TAKE these medications   COMFORT FIT MATERNITY SUPP LG  Misc 1 Units by Does not apply route daily.   cyclobenzaprine 10 MG tablet Commonly known as:  FLEXERIL Take 1 tablet (10 mg total) by mouth every 8 (eight) hours as needed for muscle spasms.   metroNIDAZOLE 500 MG tablet Commonly known as:  FLAGYL Take 1 tablet (500 mg total) by mouth 2 (two) times daily.   ondansetron 4 MG disintegrating tablet Commonly known as:  ZOFRAN ODT Take 1 tablet (4 mg total) by mouth every 8 (eight) hours as needed for nausea. What changed:  when to take this   PREPLUS 27-1 MG Tabs Take 1 tablet by mouth daily.   ranitidine 150 MG tablet Commonly known as:  ZANTAC Take 1 tablet (150 mg total) by mouth 2 (two) times daily.   sertraline 50 MG tablet Commonly known as:  ZOLOFT Take 1/2 tab po once a day for 7 days, then take 1 tab po qd      Donette Larry, CNM 01/27/2017, 4:24 PM

## 2017-01-27 NOTE — Discharge Instructions (Signed)
Viral Gastroenteritis, Adult Viral gastroenteritis is also known as the stomach flu. This condition is caused by various viruses. These viruses can be passed from person to person very easily (are very contagious). This condition may affect your stomach, small intestine, and large intestine. It can cause sudden watery diarrhea, fever, and vomiting. Diarrhea and vomiting can make you feel weak and cause you to become dehydrated. You may not be able to keep fluids down. Dehydration can make you tired and thirsty, cause you to have a dry mouth, and decrease how often you urinate. Older adults and people with other diseases or a weak immune system are at higher risk for dehydration. It is important to replace the fluids that you lose from diarrhea and vomiting. If you become severely dehydrated, you may need to get fluids through an IV tube. What are the causes? Gastroenteritis is caused by various viruses, including rotavirus and norovirus. Norovirus is the most common cause in adults. You can get sick by eating food, drinking water, or touching a surface contaminated with one of these viruses. You can also get sick from sharing utensils or other personal items with an infected person. What increases the risk? This condition is more likely to develop in people:  Who have a weak defense system (immune system).  Who live with one or more children who are younger than 80 years old.  Who live in a nursing home.  Who go on cruise ships.  What are the signs or symptoms? Symptoms of this condition start suddenly 1-2 days after exposure to a virus. Symptoms may last a few days or as long as a week. The most common symptoms are watery diarrhea and vomiting. Other symptoms include:  Fever.  Headache.  Fatigue.  Pain in the abdomen.  Chills.  Weakness.  Nausea.  Muscle aches.  Loss of appetite.  How is this diagnosed? This condition is diagnosed with a medical history and physical exam. You  may also have a stool test to check for viruses or other infections. How is this treated? This condition typically goes away on its own. The focus of treatment is to restore lost fluids (rehydration). Your health care provider may recommend that you take an oral rehydration solution (ORS) to replace important salts and minerals (electrolytes) in your body. Severe cases of this condition may require giving fluids through an IV tube. Treatment may also include medicine to help with your symptoms. Follow these instructions at home: Follow instructions from your health care provider about how to care for yourself at home. Eating and drinking Follow these recommendations as told by your health care provider:  Take an ORS. This is a drink that is sold at pharmacies and retail stores.  Drink clear fluids in small amounts as you are able. Clear fluids include water, ice chips, diluted fruit juice, and low-calorie sports drinks.  Eat bland, easy-to-digest foods in small amounts as you are able. These foods include bananas, applesauce, rice, lean meats, toast, and crackers.  Avoid fluids that contain a lot of sugar or caffeine, such as energy drinks, sports drinks, and soda.  Avoid alcohol.  Avoid spicy or fatty foods.  General instructions   Drink enough fluid to keep your urine clear or pale yellow.  Wash your hands often. If soap and water are not available, use hand sanitizer.  Make sure that all people in your household wash their hands well and often.  Take over-the-counter and prescription medicines only as told by your health  care provider.  Rest at home while you recover.  Watch your condition for any changes.  Take a warm bath to relieve any burning or pain from frequent diarrhea episodes.  Keep all follow-up visits as told by your health care provider. This is important. Contact a health care provider if:  You cannot keep fluids down.  Your symptoms get worse.  You have  new symptoms.  You feel light-headed or dizzy.  You have muscle cramps. Get help right away if:  You have chest pain.  You feel extremely weak or you faint.  You see blood in your vomit.  Your vomit looks like coffee grounds.  You have bloody or black stools or stools that look like tar.  You have a severe headache, a stiff neck, or both.  You have a rash.  You have severe pain, cramping, or bloating in your abdomen.  You have trouble breathing or you are breathing very quickly.  Your heart is beating very quickly.  Your skin feels cold and clammy.  You feel confused.  You have pain when you urinate.  You have signs of dehydration, such as: ? Dark urine, very little urine, or no urine. ? Cracked lips. ? Dry mouth. ? Sunken eyes. ? Sleepiness. ? Weakness. This information is not intended to replace advice given to you by your health care provider. Make sure you discuss any questions you have with your health care provider. Document Released: 06/28/2005 Document Revised: 12/10/2015 Document Reviewed: 03/04/2015 Elsevier Interactive Patient Education  2017 [employer] Bacterial Vaginosis Bacterial vaginosis is an infection of the vagina. It happens when too many germs (bacteria) grow in the vagina. This infection puts you at risk for infections from sex (STIs). Treating this infection can lower your risk for some STIs. You should also treat this if you are pregnant. It can cause your baby to be born early. Follow these instructions at home: Medicines  Take over-the-counter and prescription medicines only as told by your doctor.  Take or use your antibiotic medicine as told by your doctor. Do not stop taking or using it even if you start to feel better. General instructions  If you your sexual partner is a woman, tell her that you have this infection. She needs to get treatment if she has symptoms. If you have a female partner, he does not need to be  treated.  During treatment: ? Avoid sex. ? Do not douche. ? Avoid alcohol as told. ? Avoid breastfeeding as told.  Drink enough fluid to keep your pee (urine) clear or pale yellow.  Keep your vagina and butt (rectum) clean. ? Wash the area with warm water every day. ? Wipe from front to back after you use the toilet.  Keep all follow-up visits as told by your doctor. This is important. Preventing this condition  Do not douche.  Use only warm water to wash around your vagina.  Use protection when you have sex. This includes: ? Latex condoms. ? Dental dams.  Limit how many people you have sex with. It is best to only have sex with the same person (be monogamous).  Get tested for STIs. Have your partner get tested.  Wear underwear that is cotton or lined with cotton.  Avoid tight pants and pantyhose. This is most important in summer.  Do not use any products that have nicotine or tobacco in them. These include cigarettes and e-cigarettes. If you need help quitting, ask your doctor.  Do not use illegal  drugs.  Limit how much alcohol you drink. Contact a doctor if:  Your symptoms do not get better, even after you are treated.  You have more discharge or pain when you pee (urinate).  You have a fever.  You have pain in your belly (abdomen).  You have pain with sex.  Your bleed from your vagina between periods. Summary  This infection happens when too many germs (bacteria) grow in the vagina.  Treating this condition can lower your risk for some infections from sex (STIs).  You should also treat this if you are pregnant. It can cause early (premature) birth.  Do not stop taking or using your antibiotic medicine even if you start to feel better. This information is not intended to replace advice given to you by your health care provider. Make sure you discuss any questions you have with your health care provider. Document Released: 04/06/2008 Document Revised:  03/13/2016 Document Reviewed: 03/13/2016 Elsevier Interactive Patient Education  2017 [employer]. Fetal Movement Counts Patient Name: ________________________________________________ Patient Due Date: ____________________ What is a fetal movement count? A fetal movement count is the number of times that you feel your baby move during a certain amount of time. This may also be called a fetal kick count. A fetal movement count is recommended for every pregnant woman. You may be asked to start counting fetal movements as early as week 28 of your pregnancy. Pay attention to when your baby is most active. You may notice your baby's sleep and wake cycles. You may also notice things that make your baby move more. You should do a fetal movement count:  When your baby is normally most active.  At the same time each day.  A good time to count movements is while you are resting, after having something to eat and drink. How do I count fetal movements? 1. Find a quiet, comfortable area. Sit, or lie down on your side. 2. Write down the date, the start time and stop time, and the number of movements that you felt between those two times. Take this information with you to your health care visits. 3. For 2 hours, count kicks, flutters, swishes, rolls, and jabs. You should feel at least 10 movements during 2 hours. 4. You may stop counting after you have felt 10 movements. 5. If you do not feel 10 movements in 2 hours, have something to eat and drink. Then, keep resting and counting for 1 hour. If you feel at least 4 movements during that hour, you may stop counting. Contact a health care provider if:  You feel fewer than 4 movements in 2 hours.  Your baby is not moving like he or she usually does. Date: ____________ Start time: ____________ Stop time: ____________ Movements: ____________ Date: ____________ Start time: ____________ Stop time: ____________ Movements: ____________ Date: ____________ Start  time: ____________ Stop time: ____________ Movements: ____________ Date: ____________ Start time: ____________ Stop time: ____________ Movements: ____________ Date: ____________ Start time: ____________ Stop time: ____________ Movements: ____________ Date: ____________ Start time: ____________ Stop time: ____________ Movements: ____________ Date: ____________ Start time: ____________ Stop time: ____________ Movements: ____________ Date: ____________ Start time: ____________ Stop time: ____________ Movements: ____________ Date: ____________ Start time: ____________ Stop time: ____________ Movements: ____________ This information is not intended to replace advice given to you by your health care provider. Make sure you discuss any questions you have with your health care provider. Document Released: 07/28/2006 Document Revised: 02/25/2016 Document Reviewed: 08/07/2015 Elsevier Interactive Patient Education  2018 Elsevier  Inc. ° °

## 2017-01-27 NOTE — MAU Note (Signed)
Pt reports having increased pelvic pain and pressure since yesterday.  Pain is constant and hurts worse when she walks.C/o white vaginal discharge as well. Reports she has not felt baby move today.

## 2017-01-28 LAB — GC/CHLAMYDIA PROBE AMP (~~LOC~~) NOT AT ARMC
Chlamydia: NEGATIVE
Neisseria Gonorrhea: NEGATIVE

## 2017-02-04 ENCOUNTER — Ambulatory Visit (INDEPENDENT_AMBULATORY_CARE_PROVIDER_SITE_OTHER): Payer: Medicaid Other | Admitting: Obstetrics & Gynecology

## 2017-02-04 ENCOUNTER — Encounter (HOSPITAL_COMMUNITY): Payer: Self-pay | Admitting: *Deleted

## 2017-02-04 VITALS — BP 116/69 | HR 105 | Wt 241.0 lb

## 2017-02-04 DIAGNOSIS — O34219 Maternal care for unspecified type scar from previous cesarean delivery: Secondary | ICD-10-CM

## 2017-02-04 DIAGNOSIS — Z3403 Encounter for supervision of normal first pregnancy, third trimester: Secondary | ICD-10-CM

## 2017-02-04 NOTE — Progress Notes (Signed)
   PRENATAL VISIT NOTE  Subjective:  Chloe Rodgers is a 26 y.o. [id number] [id number] at 102w5d being seen today for ongoing prenatal care.  She is currently monitored for the following issues for this low-risk pregnancy and has Seizure disorder (HCC); Family history of breast cancer in mother; Previous cesarean delivery, antepartum condition or complication; Obese; Migraines; Chlamydia infection affecting pregnancy in first trimester; Supervision of normal pregnancy; and Depression affecting pregnancy in second trimester, antepartum on her problem list.  Patient reports no complaints.  Contractions: Irregular. Vag. Bleeding: None.  Movement: Present. Denies leaking of fluid.   The following portions of the patient's history were reviewed and updated as appropriate: allergies, current medications, past family history, past medical history, past social history, past surgical history and problem list. Problem list updated.  Objective:   Vitals:   02/04/17 1056  BP: 116/69  Pulse: (!) 105  Weight: 241 lb (109.3 kg)    Fetal Status: Fetal Heart Rate (bpm): 162 Fundal Height: 32 cm Movement: Present     General:  Alert, oriented and cooperative. Patient is in no acute distress.  Skin: Skin is warm and dry. No rash noted.   Cardiovascular: Normal heart rate noted  Respiratory: Normal respiratory effort, no problems with respiration noted  Abdomen: Soft, gravid, appropriate for gestational age.  Pain/Pressure: Present     Pelvic: Cervical exam deferred        Extremities: Normal range of motion.  Edema: Trace  Mental Status:  Normal mood and affect. Normal behavior. Normal judgment and thought content.   Assessment and Plan:  Pregnancy: [id number] at 322w5d  1. Previous cesarean delivery, antepartum condition or complication Will be scheduled for RCS at 39 weeks, surgical order submitted.  2. Encounter for supervision of normal first pregnancy in third trimester Preterm labor symptoms and general  obstetric precautions including but not limited to vaginal bleeding, contractions, leaking of fluid and fetal movement were reviewed in detail with the patient. Please refer to After Visit Summary for other counseling recommendations.  Return in about 2 weeks (around 02/18/2017) for OB Visit (LOB).   Jaynie CollinsUgonna Anyanwu, MD

## 2017-02-04 NOTE — Progress Notes (Signed)
Asking about contraception, questions answered and gave her booklets on family planning methods, IUd, and Nexplanon.

## 2017-02-04 NOTE — Addendum Note (Signed)
Addended by: Jaynie CollinsANYANWU, Loie Jahr A on: 02/04/2017 12:03 PM   Modules accepted: Orders, SmartSet

## 2017-02-04 NOTE — Patient Instructions (Signed)

## 2017-02-06 ENCOUNTER — Inpatient Hospital Stay (HOSPITAL_COMMUNITY)
Admission: AD | Admit: 2017-02-06 | Discharge: 2017-02-06 | Disposition: A | Discharge status: Home / Self Care | Payer: Medicaid Other | Source: Ambulatory Visit | Attending: Obstetrics and Gynecology | Admitting: Obstetrics and Gynecology

## 2017-02-06 ENCOUNTER — Encounter (HOSPITAL_COMMUNITY): Payer: Self-pay

## 2017-02-06 DIAGNOSIS — Z8619 Personal history of other infectious and parasitic diseases: Secondary | ICD-10-CM | POA: Diagnosis not present

## 2017-02-06 DIAGNOSIS — O99713 Diseases of the skin and subcutaneous tissue complicating pregnancy, third trimester: Secondary | ICD-10-CM | POA: Diagnosis not present

## 2017-02-06 DIAGNOSIS — O99343 Other mental disorders complicating pregnancy, third trimester: Secondary | ICD-10-CM | POA: Insufficient documentation

## 2017-02-06 DIAGNOSIS — O26893 Other specified pregnancy related conditions, third trimester: Secondary | ICD-10-CM | POA: Diagnosis not present

## 2017-02-06 DIAGNOSIS — Z3403 Encounter for supervision of normal first pregnancy, third trimester: Secondary | ICD-10-CM

## 2017-02-06 DIAGNOSIS — Z833 Family history of diabetes mellitus: Secondary | ICD-10-CM | POA: Insufficient documentation

## 2017-02-06 DIAGNOSIS — L409 Psoriasis, unspecified: Secondary | ICD-10-CM | POA: Insufficient documentation

## 2017-02-06 DIAGNOSIS — F32A Depression, unspecified: Secondary | ICD-10-CM

## 2017-02-06 DIAGNOSIS — F329 Major depressive disorder, single episode, unspecified: Secondary | ICD-10-CM | POA: Insufficient documentation

## 2017-02-06 DIAGNOSIS — O219 Vomiting of pregnancy, unspecified: Secondary | ICD-10-CM | POA: Diagnosis not present

## 2017-02-06 DIAGNOSIS — N898 Other specified noninflammatory disorders of vagina: Secondary | ICD-10-CM | POA: Insufficient documentation

## 2017-02-06 DIAGNOSIS — Z8249 Family history of ischemic heart disease and other diseases of the circulatory system: Secondary | ICD-10-CM | POA: Insufficient documentation

## 2017-02-06 DIAGNOSIS — Z3A31 31 weeks gestation of pregnancy: Secondary | ICD-10-CM | POA: Diagnosis not present

## 2017-02-06 DIAGNOSIS — R32 Unspecified urinary incontinence: Secondary | ICD-10-CM

## 2017-02-06 DIAGNOSIS — Z3689 Encounter for other specified antenatal screening: Secondary | ICD-10-CM

## 2017-02-06 DIAGNOSIS — O99342 Other mental disorders complicating pregnancy, second trimester: Secondary | ICD-10-CM

## 2017-02-06 DIAGNOSIS — Z825 Family history of asthma and other chronic lower respiratory diseases: Secondary | ICD-10-CM | POA: Diagnosis not present

## 2017-02-06 DIAGNOSIS — O368130 Decreased fetal movements, third trimester, not applicable or unspecified: Secondary | ICD-10-CM

## 2017-02-06 DIAGNOSIS — Z803 Family history of malignant neoplasm of breast: Secondary | ICD-10-CM | POA: Diagnosis not present

## 2017-02-06 DIAGNOSIS — Z823 Family history of stroke: Secondary | ICD-10-CM | POA: Insufficient documentation

## 2017-02-06 LAB — RAPID URINE DRUG SCREEN, HOSP PERFORMED
Amphetamines: NOT DETECTED
Barbiturates: NOT DETECTED
Benzodiazepines: NOT DETECTED
Cocaine: NOT DETECTED
Opiates: NOT DETECTED
Tetrahydrocannabinol: NOT DETECTED

## 2017-02-06 LAB — URINALYSIS, ROUTINE W REFLEX MICROSCOPIC
Bilirubin Urine: NEGATIVE
Glucose, UA: NEGATIVE mg/dL
Hgb urine dipstick: NEGATIVE
Ketones, ur: NEGATIVE mg/dL
Nitrite: NEGATIVE
Protein, ur: NEGATIVE mg/dL
Specific Gravity, Urine: 1.015 (ref 1.005–1.030)
WBC, UA: NONE SEEN WBC/hpf (ref 0–5)
pH: 7 (ref 5.0–8.0)

## 2017-02-06 LAB — WET PREP, GENITAL
Clue Cells Wet Prep HPF POC: NONE SEEN
Sperm: NONE SEEN
Trich, Wet Prep: NONE SEEN
Yeast Wet Prep HPF POC: NONE SEEN

## 2017-02-06 MED ORDER — PROMETHAZINE HCL 12.5 MG PO TABS: 12.5000 mg | ORAL_TABLET | Freq: Four times a day (QID) | ORAL | 0 refills | Status: DC | PRN

## 2017-02-06 MED ORDER — LACTATED RINGERS IV BOLUS (SEPSIS)
1000.0000 mL | Freq: Once | INTRAVENOUS | Status: AC
  Administered 2017-02-06: 1000 mL via INTRAVENOUS

## 2017-02-06 MED ORDER — ONDANSETRON 4 MG PO TBDP
4.0000 mg | ORAL_TABLET | Freq: Once | ORAL | Status: AC
  Administered 2017-02-06: 4 mg via ORAL
  Filled 2017-02-06: qty 1

## 2017-02-06 NOTE — Discharge Instructions (Signed)
Hyperemesis Gravidarum °Hyperemesis gravidarum is a severe form of nausea and vomiting that happens during pregnancy. Hyperemesis is worse than morning sickness. It may cause you to have nausea or vomiting all day for many days. It may keep you from eating and drinking enough food and liquids. Hyperemesis usually occurs during the first half (the first 20 weeks) of pregnancy. It often goes away once a woman is in her second half of pregnancy. However, sometimes hyperemesis continues through an entire pregnancy. °What are the causes? °The cause of this condition is not known. It may be related to changes in chemicals (hormones) in the body during pregnancy, such as the high level of pregnancy hormone (human chorionic gonadotropin) or the increase in the female sex hormone (estrogen). °What are the signs or symptoms? °Symptoms of this condition include: °· Severe nausea and vomiting. °· Nausea that does not go away. °· Vomiting that does not allow you to keep any food down. °· Weight loss. °· Body fluid loss (dehydration). °· Having no desire to eat, or not liking food that you have previously enjoyed. ° °How is this diagnosed? °This condition may be diagnosed based on: °· A physical exam. °· Your medical history. °· Your symptoms. °· Blood tests. °· Urine tests. ° °How is this treated? °This condition may be managed with medicine. If medicines to do not help relieve nausea and vomiting, you may need to receive fluids through an IV tube at the hospital. °Follow these instructions at home: °· Take over-the-counter and prescription medicines only as told by your health care provider. °· Avoid iron pills and multivitamins that contain iron for the first 3-4 months of pregnancy. If you take prescription iron pills, do not stop taking them unless your health care provider approves. °· Take the following actions to help prevent nausea and vomiting: °? In the morning, before getting out of bed, try eating a couple of dry  crackers or a piece of toast. °? Avoid foods and smells that upset your stomach. Fatty and spicy foods may make nausea worse. °? Eat 5-6 small meals a day. °? Do not drink fluids while eating meals. Drink between meals. °? Eat or suck on things that have ginger in them. Ginger can help relieve nausea. °? Avoid food preparation. The smell of food can spoil your appetite or trigger nausea. °· Follow instructions from your health care provider about eating or drinking restrictions. °· For snacks, eat high-protein foods, such as cheese. °· Keep all follow-up and pre-birth (prenatal) visits as told by your health care provider. This is important. °Contact a health care provider if: °· You have pain in your abdomen. °· You have a severe headache. °· You have vision problems. °· You are losing weight. °Get help right away if: °· You cannot drink fluids without vomiting. °· You vomit blood. °· You have constant nausea and vomiting. °· You are very weak. °· You are very thirsty. °· You feel dizzy. °· You faint. °· You have a fever or other symptoms that last for more than 2-3 days. °· You have a fever and your symptoms suddenly get worse. °Summary °· Hyperemesis gravidarum is a severe form of nausea and vomiting that happens during pregnancy. °· Making some changes to your eating habits may help relieve nausea and vomiting. °· This condition may be managed with medicine. °· If medicines to do not help relieve nausea and vomiting, you may need to receive fluids through an IV tube at the hospital. °This   information is not intended to replace advice given to you by your health care provider. Make sure you discuss any questions you have with your health care provider. Document Released: 06/28/2005 Document Revised: 02/25/2016 Document Reviewed: 02/25/2016 Elsevier Interactive Patient Education  2017 [employer]. Ball CorporationBraxton Hicks Contractions Contractions of the uterus can occur throughout pregnancy, but they are not always a  sign that you are in labor. You may have practice contractions called Braxton Hicks contractions. These false labor contractions are sometimes confused with true labor. What are Deberah PeltonBraxton Hicks contractions? Braxton Hicks contractions are tightening movements that occur in the muscles of the uterus before labor. Unlike true labor contractions, these contractions do not result in opening (dilation) and thinning of the cervix. Toward the end of pregnancy (32-34 weeks), Braxton Hicks contractions can happen more often and may become stronger. These contractions are sometimes difficult to tell apart from true labor because they can be very uncomfortable. You should not feel embarrassed if you go to the hospital with false labor. Sometimes, the only way to tell if you are in true labor is for your health care provider to look for changes in the cervix. The health care provider will do a physical exam and may monitor your contractions. If you are not in true labor, the exam should show that your cervix is not dilating and your water has not broken. If there are no prenatal problems or other health problems associated with your pregnancy, it is completely safe for you to be sent home with false labor. You may continue to have Braxton Hicks contractions until you go into true labor. How can I tell the difference between true labor and false labor?  Differences ? False labor ? Contractions last 30-70 seconds.: Contractions are usually shorter and not as strong as true labor contractions. ? Contractions become very regular.: Contractions are usually irregular. ? Discomfort is usually felt in the top of the uterus, and it spreads to the lower abdomen and low back.: Contractions are often felt in the front of the lower abdomen and in the groin. ? Contractions do not go away with walking.: Contractions may go away when you walk around or change positions while lying down. ? Contractions usually become more intense and  increase in frequency.: Contractions get weaker and are shorter-lasting as time goes on. ? The cervix dilates and gets thinner.: The cervix usually does not dilate or become thin. Follow these instructions at home:  Take over-the-counter and prescription medicines only as told by your health care provider.  Keep up with your usual exercises and follow other instructions from your health care provider.  Eat and drink lightly if you think you are going into labor.  If Braxton Hicks contractions are making you uncomfortable: ? Change your position from lying down or resting to walking, or change from walking to resting. ? Sit and rest in a tub of warm water. ? Drink enough fluid to keep your urine clear or pale yellow. Dehydration may cause these contractions. ? Do slow and deep breathing several times an hour.  Keep all follow-up prenatal visits as told by your health care provider. This is important. Contact a health care provider if:  You have a fever.  You have continuous pain in your abdomen. Get help right away if:  Your contractions become stronger, more regular, and closer together.  You have fluid leaking or gushing from your vagina.  You pass blood-tinged mucus (bloody show).  You have bleeding from your  vagina.  You have low back pain that you never had before.  You feel your babys head pushing down and causing pelvic pressure.  Your baby is not moving inside you as much as it used to. Summary  Contractions that occur before labor are called Braxton Hicks contractions, false labor, or practice contractions.  Braxton Hicks contractions are usually shorter, weaker, farther apart, and less regular than true labor contractions. True labor contractions usually become progressively stronger and regular and they become more frequent.  Manage discomfort from [facility] contractions by changing position, resting in a warm bath, drinking plenty of water, or practicing  deep breathing. This information is not intended to replace advice given to you by your health care provider. Make sure you discuss any questions you have with your health care provider. Document Released: 06/28/2005 Document Revised: 05/17/2016 Document Reviewed: 05/17/2016 Elsevier Interactive Patient Education  2017 Else[employer] Inc[employer] Fetal Movement Counts Patient Name: ________________________________________________ Patient Due Date: ____________________ What is a fetal movement count? A fetal movement count is the number of times that you feel your baby move during a certain amount of time. This may also be called a fetal kick count. A fetal movement count is recommended for every pregnant woman. You may be asked to start counting fetal movements as early as week 28 of your pregnancy. Pay attention to when your baby is most active. You may notice your baby's sleep and wake cycles. You may also notice things that make your baby move more. You should do a fetal movement count:  When your baby is normally most active.  At the same time each day.  A good time to count movements is while you are resting, after having something to eat and drink. How do I count fetal movements? 1. Find a quiet, comfortable area. Sit, or lie down on your side. 2. Write down the date, the start time and stop time, and the number of movements that you felt between those two times. Take this information with you to your health care visits. 3. For 2 hours, count kicks, flutters, swishes, rolls, and jabs. You should feel at least 10 movements during 2 hours. 4. You may stop counting after you have felt 10 movements. 5. If you do not feel 10 movements in 2 hours, have something to eat and drink. Then, keep resting and counting for 1 hour. If you feel at least 4 movements during that hour, you may stop counting. Contact a health care provider if:  You feel fewer than 4 movements in 2 hours.  Your baby is not moving like  he or she usually does. Date: ____________ Start time: ____________ Stop time: ____________ Movements: ____________ Date: ____________ Start time: ____________ Stop time: ____________ Movements: ____________ Date: ____________ Start time: ____________ Stop time: ____________ Movements: ____________ Date: ____________ Start time: ____________ Stop time: ____________ Movements: ____________ Date: ____________ Start time: ____________ Stop time: ____________ Movements: ____________ Date: ____________ Start time: ____________ Stop time: ____________ Movements: ____________ Date: ____________ Start time: ____________ Stop time: ____________ Movements: ____________ Date: ____________ Start time: ____________ Stop time: ____________ Movements: ____________ Date: ____________ Start time: ____________ Stop time: ____________ Movements: ____________ This information is not intended to replace advice given to you by your health care provider. Make sure you discuss any questions you have with your health care provider. Document Released: 07/28/2006 Document Revised: 02/25/2016 Document Reviewed: 08/07/2015 Elsevier Interactive Patient Education  [employer]2018 Else[employer] Inc[employer]

## 2017-02-06 NOTE — MAU Note (Signed)
Pt arrived via EMS. Pt to bed and monitors applied. Pt c/o feeling leaking of yellowish fluid since she woke up at 9am. Pt states she started feeling contractions every 5 minutes at 11am. Pt denies bleeding. Pt states she has a repeat c/section scheduled on September 23rd. Pt states baby is moving normally today.

## 2017-02-06 NOTE — MAU Provider Note (Signed)
History     CSN: [id number]  Arrival date and time: 02/06/17 1501   First Provider Initiated Contact with Patient 02/06/17 1516     [id number] @31 .0 weeks here with LOF and ctx. She woke up this am and was completely wet, puddle on the couch. Unsure of the color. Denies VB. Yellow vaginal discharge before today. She did not leak more fluid until she arrived here. Reports ctx q5 min before arrival, doesn't feel any now, just pelvic pressure. Endorses decreased FM for the last 3-4 days. Eating well. Had small amount of water today, mostly ice. Denies tobacco or drug use.    OB History    Gravida Para Term Preterm AB Living   4 2 2  0 1 2   SAB TAB Ectopic Multiple Live Births   0 1 0 0 2      Past Medical History:  Diagnosis Date  . Anemia   . Anxiety   . Chlamydia   . Complication of anesthesia    ??, seizure with wisdom teeth  . Depression   . Migraine   . PID (acute pelvic inflammatory disease) 2014  . PONV (postoperative nausea and vomiting)   . Psoriasis   . Seizures [facility])    [place]    Past Surgical History:  Procedure Laterality Date  . CESAREAN SECTION    . CESAREAN SECTION  06/06/2012   Procedure: CESAREAN SECTION;  Surgeon: Adam PhenixJames G Arnold, MD;  Location: [facility];  Service: Obstetrics;  Laterality: N/A;  . SKIN GRAFT     off abd, onto arm  . WISDOM TOOTH EXTRACTION      Family History  Problem Relation Age of Onset  . Hypertension Mother   . Diabetes Mother   . Cancer Mother        BREAST  . Stroke Mother   . Seizures Mother   . Asthma Daughter   . Hypertension Maternal Grandmother   . Diabetes Maternal Grandmother   . Cancer Maternal Grandmother        BONE CANCER  . Other Neg Hx     Social History  Substance Use Topics  . Smoking status: Never Smoker  . Smokeless tobacco: Never Used  . Alcohol use No     Comment: socially but none with pregnancy    Allergies: No Known Allergies  Prescriptions Prior to Admission  Medication  Sig Dispense Refill Last Dose  . cyclobenzaprine (FLEXERIL) 10 MG tablet Take 1 tablet (10 mg total) by mouth every 8 (eight) hours as needed for muscle spasms. 30 tablet 1 02/05/2017 at Unknown time  . ondansetron (ZOFRAN ODT) 4 MG disintegrating tablet Take 1 tablet (4 mg total) by mouth every 8 (eight) hours as needed for nausea. 20 tablet 0 02/05/2017 at Unknown time  . [DISCONTINUED] Prenatal Vit-Fe Fumarate-FA (PREPLUS) 27-1 MG TABS Take 1 tablet by mouth daily. (Patient not taking: Reported on 01/27/2017) 30 tablet 13 Not Taking    Review of Systems  Gastrointestinal: Positive for abdominal pain (ctx).  Genitourinary: Positive for vaginal discharge. Negative for vaginal bleeding.   Physical Exam   Blood pressure 134/68, pulse 93, temperature 98.1 F (36.7 C), temperature source Oral, resp. rate 18, height 5\' 6"  (1.676 m), weight 241 lb (109.3 kg), last menstrual period 07/04/2016, SpO2 96 %.  Physical Exam  Constitutional: She is oriented to person, place, and time. She appears well-developed and well-nourished. No distress.  HENT:  Head: Normocephalic and atraumatic.  Neck: Normal range of  motion.  Respiratory: Effort normal. No respiratory distress.  GI: Soft. She exhibits no distension. There is no tenderness.  gravid  Genitourinary:  Genitourinary Comments: SSE: neg pool. Fern neg scant bleeding of cervix with gently q-tip contact, closed/thick -chux pad under pt saturated with yellow odorous fluid consistent with urine  Musculoskeletal: Normal range of motion.  Neurological: She is alert and oriented to person, place, and time.  Skin: Skin is warm and dry.  Psychiatric: She has a normal mood and affect.   EFM: 155 bpm, mod variability, + accels, no decels Toco: none  Results for orders placed or performed during the hospital encounter of 02/06/17 (from the past 24 hour(s))  Urine rapid drug screen (hosp performed)     Status: None   Collection Time: 02/06/17  3:30 PM   Result Value Ref Range   Opiates NONE DETECTED NONE DETECTED   Cocaine NONE DETECTED NONE DETECTED   Benzodiazepines NONE DETECTED NONE DETECTED   Amphetamines NONE DETECTED NONE DETECTED   Tetrahydrocannabinol NONE DETECTED NONE DETECTED   Barbiturates NONE DETECTED NONE DETECTED  Wet prep, genital     Status: Abnormal   Collection Time: 02/06/17  3:30 PM  Result Value Ref Range   Yeast Wet Prep HPF POC NONE SEEN NONE SEEN   Trich, Wet Prep NONE SEEN NONE SEEN   Clue Cells Wet Prep HPF POC NONE SEEN NONE SEEN   WBC, Wet Prep HPF POC MANY (A) NONE SEEN   Sperm NONE SEEN   Urinalysis, Routine w reflex microscopic     Status: Abnormal   Collection Time: 02/06/17  3:53 PM  Result Value Ref Range   Color, Urine YELLOW YELLOW   APPearance TURBID (A) CLEAR   Specific Gravity, Urine 1.015 1.005 - 1.030   pH 7.0 5.0 - 8.0   Glucose, UA NEGATIVE NEGATIVE mg/dL   Hgb urine dipstick NEGATIVE NEGATIVE   Bilirubin Urine NEGATIVE NEGATIVE   Ketones, ur NEGATIVE NEGATIVE mg/dL   Protein, ur NEGATIVE NEGATIVE mg/dL   Nitrite NEGATIVE NEGATIVE   Leukocytes, UA SMALL (A) NEGATIVE   RBC / HPF 0-5 0 - 5 RBC/hpf   WBC, UA NONE SEEN 0 - 5 WBC/hpf   Bacteria, UA RARE (A) NONE SEEN   Squamous Epithelial / LPF 0-5 (A) NONE SEEN   Mucous PRESENT     MAU Course  Procedures LR 1 L bolus Zofran  MDM Labs ordered and reviewed. FHR initially tachy at 165 then improved to baseline of 155 and reactive after IVF bolus. No evidence of PTL or SROM, LOF likely urine. Long standing N/V of pregnancy, Phenergan helps, will Rx. Stable for discharge home.    Assessment and Plan   1. [redacted] weeks gestation of pregnancy   2. Depression affecting pregnancy in second trimester, antepartum   3. Encounter for supervision of normal first pregnancy in third trimester   4. NST (non-stress test) reactive   5. Nausea/vomiting in pregnancy   6. Urinary incontinence, unspecified type   7. Decreased fetal movements in  third trimester, single or unspecified fetus    Discharge home Follow up in OB office as scheduled Increase water intake to 5-6 bottles per day PTL precautions FMCs Rx Phenergan  Allergies as of 02/06/2017   No Known Allergies     Medication List    STOP taking these medications   ondansetron 4 MG disintegrating tablet Commonly known as:  ZOFRAN ODT     TAKE these medications   cyclobenzaprine 10 MG  tablet Commonly known as:  FLEXERIL Take 1 tablet (10 mg total) by mouth every 8 (eight) hours as needed for muscle spasms.   promethazine 12.5 MG tablet Commonly known as:  PHENERGAN Take 1 tablet (12.5 mg total) by mouth every 6 (six) hours as needed for nausea or vomiting.      Donette Larry, CNM 02/06/2017, 3:30 PM

## 2017-02-07 LAB — GC/CHLAMYDIA PROBE AMP (~~LOC~~) NOT AT ARMC
Chlamydia: NEGATIVE
Neisseria Gonorrhea: NEGATIVE

## 2017-02-15 ENCOUNTER — Ambulatory Visit (INDEPENDENT_AMBULATORY_CARE_PROVIDER_SITE_OTHER): Payer: Medicaid Other | Admitting: Obstetrics & Gynecology

## 2017-02-15 VITALS — BP 121/75 | HR 100 | Wt 242.7 lb

## 2017-02-15 DIAGNOSIS — Z3403 Encounter for supervision of normal first pregnancy, third trimester: Secondary | ICD-10-CM

## 2017-02-15 MED ORDER — PANTOPRAZOLE SODIUM 40 MG PO TBEC: 40.0000 mg | DELAYED_RELEASE_TABLET | Freq: Every day | ORAL | 1 refills | Status: DC

## 2017-02-15 NOTE — Patient Instructions (Signed)
Third Trimester of Pregnancy The third trimester is from week 28 through week 40 (months 7 through 9). The third trimester is a time when the unborn baby (fetus) is growing rapidly. At the end of the ninth month, the fetus is about 20 inches in length and weighs 6-10 pounds. Body changes during your third trimester Your body will continue to go through many changes during pregnancy. The changes vary from woman to woman. During the third trimester:  Your weight will continue to increase. You can expect to gain 25-35 pounds (11-16 kg) by the end of the pregnancy.  You may begin to get stretch marks on your hips, abdomen, and breasts.  You may urinate more often because the fetus is moving lower into your pelvis and pressing on your bladder.  You may develop or continue to have heartburn. This is caused by increased hormones that slow down muscles in the digestive tract.  You may develop or continue to have constipation because increased hormones slow digestion and cause the muscles that push waste through your intestines to relax.  You may develop hemorrhoids. These are swollen veins (varicose veins) in the rectum that can itch or be painful.  You may develop swollen, bulging veins (varicose veins) in your legs.  You may have increased body aches in the pelvis, back, or thighs. This is due to weight gain and increased hormones that are relaxing your joints.  You may have changes in your hair. These can include thickening of your hair, rapid growth, and changes in texture. Some women also have hair loss during or after pregnancy, or hair that feels dry or thin. Your hair will most likely return to normal after your baby is born.  Your breasts will continue to grow and they will continue to become tender. A yellow fluid (colostrum) may leak from your breasts. This is the first milk you are producing for your baby.  Your belly button may stick out.  You may notice more swelling in your hands,  face, or ankles.  You may have increased tingling or numbness in your hands, arms, and legs. The skin on your belly may also feel numb.  You may feel short of breath because of your expanding uterus.  You may have more problems sleeping. This can be caused by the size of your belly, increased need to urinate, and an increase in your body's metabolism.  You may notice the fetus "dropping," or moving lower in your abdomen (lightening).  You may have increased vaginal discharge.  You may notice your joints feel loose and you may have pain around your pelvic bone.  What to expect at prenatal visits You will have prenatal exams every 2 weeks until week 36. Then you will have weekly prenatal exams. During a routine prenatal visit:  You will be weighed to make sure you and the baby are growing normally.  Your blood pressure will be taken.  Your abdomen will be measured to track your baby's growth.  The fetal heartbeat will be listened to.  Any test results from the previous visit will be discussed.  You may have a cervical check near your due date to see if your cervix has softened or thinned (effaced).  You will be tested for Group B streptococcus. This happens between 35 and 37 weeks.  Your health care provider may ask you:  What your birth plan is.  How you are feeling.  If you are feeling the baby move.  If you have had   any abnormal symptoms, such as leaking fluid, bleeding, severe headaches, or abdominal cramping.  If you are using any tobacco products, including cigarettes, chewing tobacco, and electronic cigarettes.  If you have any questions.  Other tests or screenings that may be performed during your third trimester include:  Blood tests that check for low iron levels (anemia).  Fetal testing to check the health, activity level, and growth of the fetus. Testing is done if you have certain medical conditions or if there are problems during the  pregnancy.  Nonstress test (NST). This test checks the health of your baby to make sure there are no signs of problems, such as the baby not getting enough oxygen. During this test, a belt is placed around your belly. The baby is made to move, and its heart rate is monitored during movement.  What is false labor? False labor is a condition in which you feel small, irregular tightenings of the muscles in the womb (contractions) that usually go away with rest, changing position, or drinking water. These are called Braxton Hicks contractions. Contractions may last for hours, days, or even weeks before true labor sets in. If contractions come at regular intervals, become more frequent, increase in intensity, or become painful, you should see your health care provider. What are the signs of labor?  Abdominal cramps.  Regular contractions that start at 10 minutes apart and become stronger and more frequent with time.  Contractions that start on the top of the uterus and spread down to the lower abdomen and back.  Increased pelvic pressure and dull back pain.  A watery or bloody mucus discharge that comes from the vagina.  Leaking of amniotic fluid. This is also known as your "water breaking." It could be a slow trickle or a gush. Let your health care provider know if it has a color or strange odor. If you have any of these signs, call your health care provider right away, even if it is before your due date. Follow these instructions at home: Medicines  Follow your health care provider's instructions regarding medicine use. Specific medicines may be either safe or unsafe to take during pregnancy.  Take a prenatal vitamin that contains at least 600 micrograms (mcg) of folic acid.  If you develop constipation, try taking a stool softener if your health care provider approves. Eating and drinking  Eat a balanced diet that includes fresh fruits and vegetables, whole grains, good sources of protein  such as meat, eggs, or tofu, and low-fat dairy. Your health care provider will help you determine the amount of weight gain that is right for you.  Avoid raw meat and uncooked cheese. These carry germs that can cause birth defects in the baby.  If you have low calcium intake from food, talk to your health care provider about whether you should take a daily calcium supplement.  Eat four or five small meals rather than three large meals a day.  Limit foods that are high in fat and processed sugars, such as fried and sweet foods.  To prevent constipation: ? Drink enough fluid to keep your urine clear or pale yellow. ? Eat foods that are high in fiber, such as fresh fruits and vegetables, whole grains, and beans. Activity  Exercise only as directed by your health care provider. Most women can continue their usual exercise routine during pregnancy. Try to exercise for 30 minutes at least 5 days a week. Stop exercising if you experience uterine contractions.  Avoid heavy   lifting.  Do not exercise in extreme heat or humidity, or at high altitudes.  Wear low-heel, comfortable shoes.  Practice good posture.  You may continue to have sex unless your health care provider tells you otherwise. Relieving pain and discomfort  Take frequent breaks and rest with your legs elevated if you have leg cramps or low back pain.  Take warm sitz baths to soothe any pain or discomfort caused by hemorrhoids. Use hemorrhoid cream if your health care provider approves.  Wear a good support bra to prevent discomfort from breast tenderness.  If you develop varicose veins: ? Wear support pantyhose or compression stockings as told by your healthcare provider. ? Elevate your feet for 15 minutes, 3-4 times a day. Prenatal care  Write down your questions. Take them to your prenatal visits.  Keep all your prenatal visits as told by your health care provider. This is important. Safety  Wear your seat belt at  all times when driving.  Make a list of emergency phone numbers, including numbers for family, friends, the hospital, and police and fire departments. General instructions  Avoid cat litter boxes and soil used by cats. These carry germs that can cause birth defects in the baby. If you have a cat, ask someone to clean the litter box for you.  Do not travel far distances unless it is absolutely necessary and only with the approval of your health care provider.  Do not use hot tubs, steam rooms, or saunas.  Do not drink alcohol.  Do not use any products that contain nicotine or tobacco, such as cigarettes and e-cigarettes. If you need help quitting, ask your health care provider.  Do not use any medicinal herbs or unprescribed drugs. These chemicals affect the formation and growth of the baby.  Do not douche or use tampons or scented sanitary pads.  Do not cross your legs for long periods of time.  To prepare for the arrival of your baby: ? Take prenatal classes to understand, practice, and ask questions about labor and delivery. ? Make a trial run to the hospital. ? Visit the hospital and tour the maternity area. ? Arrange for maternity or paternity leave through employers. ? Arrange for family and friends to take care of pets while you are in the hospital. ? Purchase a rear-facing car seat and make sure you know how to install it in your car. ? Pack your hospital bag. ? Prepare the baby's nursery. Make sure to remove all pillows and stuffed animals from the baby's crib to prevent suffocation.  Visit your dentist if you have not gone during your pregnancy. Use a soft toothbrush to brush your teeth and be gentle when you floss. Contact a health care provider if:  You are unsure if you are in labor or if your water has broken.  You become dizzy.  You have mild pelvic cramps, pelvic pressure, or nagging pain in your abdominal area.  You have lower back pain.  You have persistent  nausea, vomiting, or diarrhea.  You have an unusual or bad smelling vaginal discharge.  You have pain when you urinate. Get help right away if:  Your water breaks before 37 weeks.  You have regular contractions less than 5 minutes apart before 37 weeks.  You have a fever.  You are leaking fluid from your vagina.  You have spotting or bleeding from your vagina.  You have severe abdominal pain or cramping.  You have rapid weight loss or weight gain.    You have shortness of breath with chest pain.  You notice sudden or extreme swelling of your face, hands, ankles, feet, or legs.  Your baby makes fewer than 10 movements in 2 hours.  You have severe headaches that do not go away when you take medicine.  You have vision changes. Summary  The third trimester is from week 28 through week 40, months 7 through 9. The third trimester is a time when the unborn baby (fetus) is growing rapidly.  During the third trimester, your discomfort may increase as you and your baby continue to gain weight. You may have abdominal, leg, and back pain, sleeping problems, and an increased need to urinate.  During the third trimester your breasts will keep growing and they will continue to become tender. A yellow fluid (colostrum) may leak from your breasts. This is the first milk you are producing for your baby.  False labor is a condition in which you feel small, irregular tightenings of the muscles in the womb (contractions) that eventually go away. These are called Braxton Hicks contractions. Contractions may last for hours, days, or even weeks before true labor sets in.  Signs of labor can include: abdominal cramps; regular contractions that start at 10 minutes apart and become stronger and more frequent with time; watery or bloody mucus discharge that comes from the vagina; increased pelvic pressure and dull back pain; and leaking of amniotic fluid. This information is not intended to replace advice  given to you by your health care provider. Make sure you discuss any questions you have with your health care provider. Document Released: 06/22/2001 Document Revised: 12/04/2015 Document Reviewed: 08/29/2012 [employer] Interactive Patient Education  2017 [employer]  

## 2017-02-15 NOTE — Progress Notes (Signed)
Pt c/o pelvic pain and heartburn

## 2017-02-15 NOTE — Progress Notes (Signed)
   PRENATAL VISIT NOTE  Subjective:  Chloe Rodgers is a 26 y.o. [phone] at 3140w2d being seen today for ongoing prenatal care.  She is currently monitored for the following issues for this high-risk pregnancy and has Seizure disorder (HCC); Family history of breast cancer in mother; Previous cesarean delivery, antepartum condition or complication; Obese; Migraines; Chlamydia infection affecting pregnancy in first trimester; Supervision of normal pregnancy; and Depression affecting pregnancy in second trimester, antepartum on her problem list.  Patient reports heartburn.  Contractions: Irregular. Vag. Bleeding: None.  Movement: Present. Denies leaking of fluid.   The following portions of the patient's history were reviewed and updated as appropriate: allergies, current medications, past family history, past medical history, past social history, past surgical history and problem list. Problem list updated.  Objective:   Vitals:   02/15/17 1604  BP: 121/75  Pulse: 100  Weight: 242 lb 11.2 oz (110.1 kg)    Fetal Status: Fetal Heart Rate (bpm): 175   Movement: Present     General:  Alert, oriented and cooperative. Patient is in no acute distress.  Skin: Skin is warm and dry. No rash noted.   Cardiovascular: Normal heart rate noted  Respiratory: Normal respiratory effort, no problems with respiration noted  Abdomen: Soft, gravid, appropriate for gestational age.  Pain/Pressure: Present     Pelvic: Cervical exam deferred        Extremities: Normal range of motion.  Edema: Trace  Mental Status:  Normal mood and affect. Normal behavior. Normal judgment and thought content.   Assessment and Plan:  Pregnancy: [id number] at 2540w2d  1. Encounter for supervision of normal first pregnancy in third trimester Fetal tachycardia noted - US MFM OB FOLLOW UP; Future - pantoprazole (PROTONIX) 40 MG tablet; Take 1 tablet (40 mg total) by mouth daily.  Dispense: 30 tablet; Refill: 1  Preterm labor symptoms  and general obstetric precautions including but not limited to vaginal bleeding, contractions, leaking of fluid and fetal movement were reviewed in detail with the patient. Please refer to After Visit Summary for other counseling recommendations.  Return in about 1 week (around 02/22/2017).   Scheryl DarterJames Esley Brooking, MD

## 2017-02-17 ENCOUNTER — Other Ambulatory Visit: Payer: Self-pay | Admitting: Obstetrics & Gynecology

## 2017-02-17 ENCOUNTER — Encounter (HOSPITAL_COMMUNITY): Payer: Self-pay

## 2017-02-17 ENCOUNTER — Ambulatory Visit (HOSPITAL_COMMUNITY)
Admission: RE | Admit: 2017-02-17 | Discharge: 2017-02-17 | Disposition: A | Discharge status: Home / Self Care | Payer: Medicaid Other | Source: Ambulatory Visit | Attending: Obstetrics & Gynecology | Admitting: Obstetrics & Gynecology

## 2017-02-17 DIAGNOSIS — F32A Depression, unspecified: Secondary | ICD-10-CM

## 2017-02-17 DIAGNOSIS — F329 Major depressive disorder, single episode, unspecified: Secondary | ICD-10-CM

## 2017-02-17 DIAGNOSIS — O99353 Diseases of the nervous system complicating pregnancy, third trimester: Secondary | ICD-10-CM

## 2017-02-17 DIAGNOSIS — Z3403 Encounter for supervision of normal first pregnancy, third trimester: Secondary | ICD-10-CM

## 2017-02-17 DIAGNOSIS — O34219 Maternal care for unspecified type scar from previous cesarean delivery: Secondary | ICD-10-CM | POA: Diagnosis not present

## 2017-02-17 DIAGNOSIS — Z3A32 32 weeks gestation of pregnancy: Secondary | ICD-10-CM | POA: Diagnosis not present

## 2017-02-17 DIAGNOSIS — G40909 Epilepsy, unspecified, not intractable, without status epilepticus: Secondary | ICD-10-CM | POA: Diagnosis not present

## 2017-02-17 DIAGNOSIS — O99213 Obesity complicating pregnancy, third trimester: Secondary | ICD-10-CM | POA: Diagnosis not present

## 2017-02-17 DIAGNOSIS — O99342 Other mental disorders complicating pregnancy, second trimester: Secondary | ICD-10-CM

## 2017-02-19 ENCOUNTER — Emergency Department (HOSPITAL_COMMUNITY): Payer: Medicaid Other

## 2017-02-19 ENCOUNTER — Emergency Department (HOSPITAL_COMMUNITY)
Admission: EM | Admit: 2017-02-19 | Discharge: 2017-02-19 | Disposition: A | Discharge status: Home / Self Care | Payer: Medicaid Other | Attending: Emergency Medicine | Admitting: Emergency Medicine

## 2017-02-19 ENCOUNTER — Encounter (HOSPITAL_COMMUNITY): Payer: Self-pay | Admitting: Emergency Medicine

## 2017-02-19 ENCOUNTER — Other Ambulatory Visit: Payer: Self-pay

## 2017-02-19 DIAGNOSIS — O9989 Other specified diseases and conditions complicating pregnancy, childbirth and the puerperium: Secondary | ICD-10-CM | POA: Insufficient documentation

## 2017-02-19 DIAGNOSIS — O99413 Diseases of the circulatory system complicating pregnancy, third trimester: Secondary | ICD-10-CM | POA: Diagnosis not present

## 2017-02-19 DIAGNOSIS — R000 Tachycardia, unspecified: Secondary | ICD-10-CM

## 2017-02-19 DIAGNOSIS — R0602 Shortness of breath: Secondary | ICD-10-CM | POA: Insufficient documentation

## 2017-02-19 DIAGNOSIS — Z79899 Other long term (current) drug therapy: Secondary | ICD-10-CM | POA: Insufficient documentation

## 2017-02-19 DIAGNOSIS — R079 Chest pain, unspecified: Secondary | ICD-10-CM | POA: Diagnosis not present

## 2017-02-19 DIAGNOSIS — Z3A00 Weeks of gestation of pregnancy not specified: Secondary | ICD-10-CM | POA: Insufficient documentation

## 2017-02-19 DIAGNOSIS — R1110 Vomiting, unspecified: Secondary | ICD-10-CM | POA: Diagnosis not present

## 2017-02-19 LAB — CBC
HCT: 29.5 % — ABNORMAL LOW (ref 36.0–46.0)
Hemoglobin: 9.9 g/dL — ABNORMAL LOW (ref 12.0–15.0)
MCH: 26.8 pg (ref 26.0–34.0)
MCHC: 33.6 g/dL (ref 30.0–36.0)
MCV: 79.9 fL (ref 78.0–100.0)
Platelets: 166 10*3/uL (ref 150–400)
RBC: 3.69 MIL/uL — ABNORMAL LOW (ref 3.87–5.11)
RDW: 13.8 % (ref 11.5–15.5)
WBC: 7.7 10*3/uL (ref 4.0–10.5)

## 2017-02-19 LAB — BASIC METABOLIC PANEL
Anion gap: 8 (ref 5–15)
BUN: 6 mg/dL (ref 6–20)
CO2: 22 mmol/L (ref 22–32)
Calcium: 9 mg/dL (ref 8.9–10.3)
Chloride: 106 mmol/L (ref 101–111)
Creatinine, Ser: 0.65 mg/dL (ref 0.44–1.00)
GFR calc Af Amer: >60 mL/min (ref >60)
GFR calc non Af Amer: >60 mL/min (ref >60)
Glucose, Bld: 93 mg/dL (ref 65–99)
Potassium: 3.7 mmol/L (ref 3.5–5.1)
Sodium: 136 mmol/L (ref 135–145)

## 2017-02-19 LAB — I-STAT TROPONIN, ED: Troponin i, poc: 0.01 ng/mL (ref 0.00–0.08)

## 2017-02-19 MED ORDER — SODIUM CHLORIDE 0.9 % IV BOLUS (SEPSIS)
1000.0000 mL | Freq: Once | INTRAVENOUS | Status: AC
  Administered 2017-02-19: 1000 mL via INTRAVENOUS

## 2017-02-19 MED ORDER — IOPAMIDOL (ISOVUE-370) INJECTION 76%
INTRAVENOUS | Status: AC
  Administered 2017-02-19: 100 mL
  Filled 2017-02-19: qty 100

## 2017-02-19 MED ORDER — ALUM & MAG HYDROXIDE-SIMETH 200-200-20 MG/5ML PO SUSP
30.0000 mL | Freq: Once | ORAL | Status: AC
  Administered 2017-02-19: 30 mL via ORAL
  Filled 2017-02-19: qty 30

## 2017-02-19 MED ORDER — FAMOTIDINE 20 MG PO TABS
20.0000 mg | ORAL_TABLET | Freq: Once | ORAL | Status: AC
  Administered 2017-02-19: 20 mg via ORAL
  Filled 2017-02-19: qty 1

## 2017-02-19 NOTE — ED Triage Notes (Signed)
Pt to ER for evaluation of shortness of breath. States onset yesterday while at rest. NAD at triage. Is 8 months pregnant, OB rapid response has been notified and states will be here once patient is roomed.

## 2017-02-19 NOTE — ED Provider Notes (Signed)
[facility]Y DEPT Provider Note   CSN: [id number] Arrival date & time: 02/19/17  1541     History   Chief Complaint Chief Complaint  Patient presents with  . Shortness of Breath    HPI  Blood pressure 108/72, pulse (!) 106, temperature 98.4 F (36.9 C), temperature source Oral, resp. rate 20, last menstrual period 07/04/2016, SpO2 100 %.  Chloe Rodgers is a 26 y.o. female with past medical history significant for seizure disorder, 8 months pregnant (classified high risk secondary to seizure) complaining of shortness of breath, dry cough and chest pain (nonpleuritic, non-positional, states it's worse at night) onset 2 weeks ago. She also states that she doesn't feel like she is herself and started vomiting yesterday. Emesis is nonbloody, nonbilious, non-coffee ground in nature. There has been no syncope, palpitations, hemoptysis. She is endorsing lower abdominal cramping without any contractions, vaginal bleeding, discharge of fluid. On review of systems she endorses a right lower extremity pain onset 2 weeks ago without significant swelling. She states she has normal urination with decreased bowel movements.  Past Medical History:  Diagnosis Date  . Anemia   . Anxiety   . Chlamydia   . Complication of anesthesia    ??, seizure with wisdom teeth  . Depression   . Migraine   . PID (acute pelvic inflammatory disease) 2014  . PONV (postoperative nausea and vomiting)   . Psoriasis   . Seizures [facility])    July 2013 - [place]    Patient Active Problem List   Diagnosis Date Noted  . Depression affecting pregnancy in second trimester, antepartum 01/05/2017  . Supervision of normal pregnancy 10/06/2016  . Chlamydia infection affecting pregnancy in first trimester 08/23/2016  . Migraines 07/24/2012  . Previous cesarean delivery, antepartum condition or complication 03/30/2012  . Obese 03/30/2012  . Seizure disorder (HCC) 02/03/2012  . Family history of breast cancer in  mother 02/03/2012    Past Surgical History:  Procedure Laterality Date  . CESAREAN SECTION    . CESAREAN SECTION  06/06/2012   Procedure: CESAREAN SECTION;  Surgeon: Adam Phenix, MD;  Location: [facility];  Service: Obstetrics;  Laterality: N/A;  . SKIN GRAFT     off abd, onto arm  . WISDOM TOOTH EXTRACTION      OB History    Gravida Para Term Preterm AB Living   4 2 2  0 1 2   SAB TAB Ectopic Multiple Live Births   0 1 0 0 2       Home Medications    Prior to Admission medications   Medication Sig Start Date End Date Taking? Authorizing Provider  cyclobenzaprine (FLEXERIL) 10 MG tablet Take 1 tablet (10 mg total) by mouth every 8 (eight) hours as needed for muscle spasms. 12/01/16  Yes Rasch, Harolyn Rutherford, NP  ranitidine (ZANTAC) 150 MG tablet Take 150 mg by mouth daily as needed for heartburn.   Yes [provider]  pantoprazole (PROTONIX) 40 MG tablet Take 1 tablet (40 mg total) by mouth daily. Patient not taking: Reported on 02/17/2017 02/15/17   Adam Phenix, MD  promethazine (PHENERGAN) 12.5 MG tablet Take 1 tablet (12.5 mg total) by mouth every 6 (six) hours as needed for nausea or vomiting. Patient not taking: Reported on 02/19/2017 02/06/17   Donette Larry, CNM    Family History Family History  Problem Relation Age of Onset  . Hypertension Mother   . Diabetes Mother   . Cancer Mother  BREAST  . Stroke Mother   . Seizures Mother   . Asthma Daughter   . Hypertension Maternal Grandmother   . Diabetes Maternal Grandmother   . Cancer Maternal Grandmother        BONE CANCER  . Other Neg Hx     Social History Social History  Substance Use Topics  . Smoking status: Never Smoker  . Smokeless tobacco: Never Used  . Alcohol use No     Comment: socially but none with pregnancy     Allergies   Patient has no known allergies.   Review of Systems Review of Systems  A complete review of systems was obtained and all systems are negative except as  noted in the HPI and PMH.   Physical Exam Updated Vital Signs BP 97/64   Pulse 87   Temp 98.4 F (36.9 C) (Oral)   Resp 20   LMP 07/04/2016   SpO2 99%   Physical Exam  Constitutional: She is oriented to person, place, and time. She appears well-developed and well-nourished. No distress.  HENT:  Head: Normocephalic and atraumatic.  Mouth/Throat: Oropharynx is clear and moist.  Eyes: Pupils are equal, round, and reactive to light. Conjunctivae and EOM are normal.  Neck: Normal range of motion. No JVD present. No tracheal deviation present.  Cardiovascular: Normal rate, regular rhythm and intact distal pulses.   Not tachycardic  Pulmonary/Chest: Effort normal and breath sounds normal. No stridor. No respiratory distress. She has no wheezes. She has no rales. She exhibits no tenderness.  Abdominal: Soft. She exhibits no distension and no mass. There is no tenderness. There is no rebound and no guarding.  Gravid abdomen, no significant tenderness to palpation.  Musculoskeletal: Normal range of motion. She exhibits no edema or tenderness.  No calf asymmetry, superficial collaterals, palpable cords, edema, Homans sign negative bilaterally.    Neurological: She is alert and oriented to person, place, and time.  Skin: Skin is warm. She is not diaphoretic.  Psychiatric: She has a normal mood and affect.  Nursing note and vitals reviewed.     ED Treatments / Results  Labs (all labs ordered are listed, but only abnormal results are displayed) Labs Reviewed  CBC - Abnormal; Notable for the following:       Result Value   RBC 3.69 (*)    Hemoglobin 9.9 (*)    HCT 29.5 (*)    All other components within normal limits  BASIC METABOLIC PANEL  URINALYSIS, ROUTINE W REFLEX MICROSCOPIC  I-STAT TROPONIN, ED    EKG  EKG Interpretation None       Radiology Dg Chest 2 View  Result Date: 02/19/2017 CLINICAL DATA:  Shortness of breath beginning yesterday at rest, some chest pain  EXAM: CHEST  2 VIEW COMPARISON:  05/02/2016 FINDINGS: Normal heart size, mediastinal contours, and pulmonary vascularity. Lungs clear. No pleural effusion or pneumothorax. Bones unremarkable. IMPRESSION: Normal exam. Electronically Signed   By: Ulyses SouthwardMark  Boles M.D.   On: 02/19/2017 17:06   Ct Angio Chest Pe W And/or Wo Contrast  Result Date: 02/19/2017 CLINICAL DATA:  Acute onset of mid chest pain and shortness of breath. Initial encounter. EXAM: CT ANGIOGRAPHY CHEST WITH CONTRAST TECHNIQUE: Multidetector CT imaging of the chest was performed using the standard protocol during bolus administration of intravenous contrast. Multiplanar CT image reconstructions and MIPs were obtained to evaluate the vascular anatomy. CONTRAST:  70 mL of Isovue 370 IV contrast COMPARISON:  Chest radiograph performed earlier today at 4:20 p.m.  FINDINGS: Cardiovascular: The study is suboptimal as the exam was interrupted at the midportion of the lower lung lobes. No central pulmonary embolus is seen. The heart remains normal in size. The thoracic aorta is grossly unremarkable. The great vessels are within normal limits. Mediastinum/Nodes: The mediastinum is unremarkable in appearance. No mediastinal lymphadenopathy is seen. No pericardial effusion is identified. The visualized portions of the thyroid gland are unremarkable. No axillary lymphadenopathy is seen. Lungs/Pleura: The lungs appear clear bilaterally. No focal consolidation, pleural effusion or pneumothorax is seen. No masses are identified. Upper Abdomen: The visualized portions of the liver and spleen are grossly unremarkable. Musculoskeletal: No acute osseous abnormalities are identified. The visualized musculature is unremarkable in appearance. Review of the MIP images confirms the above findings. IMPRESSION: 1. Suboptimal study as the exam was interrupted at the midportion of the lower lung lobes. No central pulmonary embolus seen. 2. Lungs clear bilaterally. Electronically  Signed   By: Roanna Raider M.D.   On: 02/19/2017 21:31    Procedures Procedures (including critical care time)  Medications Ordered in ED Medications  sodium chloride 0.9 % bolus 1,000 mL (0 mLs Intravenous Stopped 02/19/17 2129)  famotidine (PEPCID) tablet 20 mg (20 mg Oral Given 02/19/17 1847)  alum & mag hydroxide-simeth (MAALOX/MYLANTA) 200-200-20 MG/5ML suspension 30 mL (30 mLs Oral Given 02/19/17 1847)  iopamidol (ISOVUE-370) 76 % injection (100 mLs  Contrast Given 02/19/17 2105)     Initial Impression / Assessment and Plan / ED Course  I have reviewed the triage vital signs and the nursing notes.  Pertinent labs & imaging results that were available during my care of the patient were reviewed by me and considered in my medical decision making (see chart for details).     Vitals:   02/19/17 1945 02/19/17 2030 02/19/17 2130 02/19/17 2200  BP: 111/64 102/60 (!) 105/94 97/64  Pulse: 93 92 92 87  Resp:      Temp:      TempSrc:      SpO2: 100% 100% 100% 99%    Medications  sodium chloride 0.9 % bolus 1,000 mL (0 mLs Intravenous Stopped 02/19/17 2129)  famotidine (PEPCID) tablet 20 mg (20 mg Oral Given 02/19/17 1847)  alum & mag hydroxide-simeth (MAALOX/MYLANTA) 200-200-20 MG/5ML suspension 30 mL (30 mLs Oral Given 02/19/17 1847)  iopamidol (ISOVUE-370) 76 % injection (100 mLs  Contrast Given 02/19/17 2105)    Chloe Rodgers is 26 y.o. female presenting with shortness of breath side onset 1 week ago. She is also reporting pain in the right lower extremity however, physical exam is not consistent with DVT. Patient is 8 months pregnant, she is not reporting any significant abdominal pain or vaginal bleeding or discharge. She is cleared by rapid OB. Chest x-rays negative, patient is initially tachycardic however this is resolved after hydration. She saturating well on room air. Although I think it is unlikely that she has a PE given her pregnant status she is at higher risk and will  obtain a CT Angie oh.  Unfortunately the CT angiogram is defined as suboptimal however they do not see any central pulmonary embolism. After discussion with the patient she states that she is feeling significantly better, tachycardia has resolved, shortness of breath has improved. Patient understands that the CAT scan cannot definitively rule out a PE. She understands that she must return to the emergency department for any return of her symptoms. She verbalized understanding and teach back technique.  This is a shared visit with the attending  physician who personally evaluated the patient and agrees with the care plan.   Evaluation does not show pathology that would require ongoing emergent intervention or inpatient treatment. Pt is hemodynamically stable and mentating appropriately. Discussed findings and plan with patient/guardian, who agrees with care plan. All questions answered. Return precautions discussed and outpatient follow up given.      Final Clinical Impressions(s) / ED Diagnoses   Final diagnoses:  SOB (shortness of breath)  Tachycardia    New Prescriptions Discharge Medication List as of 02/19/2017 10:39 PM       Tavari Loadholt, Mardella Layman 02/20/17 0058    Cathren Laine, MD 02/20/17 1614

## 2017-02-19 NOTE — Progress Notes (Signed)
Pt is a G4P2 at 326/[redacted] weeks gestation here with c/o shortness of breath and chest pain. Blood pressure is nl. Pt denies any significant medical hx. Says pain is in the middle of her chest and started yesterday. Says she has had N&V off and on since yesterday. Pt says she has had diarreaha today also. EKG, chest xray and traponin are nl. Pt denies vaginal bleeding or leaking of fluid. [facility]. Pt has two living children, both were C/S deliveries. Pt says she has been prescribed medication for heartburn, but has not taken it because it makes her sleepy.

## 2017-02-19 NOTE — ED Notes (Signed)
OB Rapid Response called 

## 2017-02-19 NOTE — ED Notes (Signed)
Ob rapid response rn has been called about this pt

## 2017-02-19 NOTE — Progress Notes (Signed)
Spoke with Dr. Shawnie PonsPratt. Pt is a G4 P2 at 32 6/[redacted] weeks gestation here with c/o chest pain and shortness of breath. Chest xray is nl, EKG is nl, and traponin is nl. Pt says she has had N&V along with some diareaha for the past two days. She says the N&V came first before the chest pain. No vaginal bleeding or leaking of fluid. FHR tracing is a category 1 with no uc's.  [facility] and was prescribed Protonix at her last visit. Says she can't take it because it makes her sleepy. Pt is OB cleared. Ed staff notified.

## 2017-02-19 NOTE — Discharge Instructions (Signed)
Please follow with your primary care doctor in the next 2 days for a check-up. They must obtain records for further management.  ° °Do not hesitate to return to the Emergency Department for any new, worsening or concerning symptoms.  ° °

## 2017-02-19 NOTE — ED Notes (Signed)
The pt is pregnant and has has fatigue sob   For 2 weeks none now  She just feels bad  Her edcis sept 30th  Her pregnancy has beenj normal  Sl abd cramps for a long time.  No distress.

## 2017-02-19 NOTE — ED Notes (Signed)
Patient left at this time with all belongings. 

## 2017-02-25 ENCOUNTER — Encounter: Payer: Medicaid Other | Admitting: Obstetrics & Gynecology

## 2017-03-02 ENCOUNTER — Encounter: Payer: Medicaid Other | Admitting: Obstetrics & Gynecology

## 2017-03-07 ENCOUNTER — Encounter: Payer: Medicaid Other | Admitting: Obstetrics and Gynecology

## 2017-03-17 ENCOUNTER — Other Ambulatory Visit (HOSPITAL_COMMUNITY)
Admission: RE | Admit: 2017-03-17 | Discharge: 2017-03-17 | Disposition: A | Discharge status: Home / Self Care | Payer: Medicaid Other | Source: Ambulatory Visit | Attending: Obstetrics and Gynecology | Admitting: Obstetrics and Gynecology

## 2017-03-17 ENCOUNTER — Ambulatory Visit (INDEPENDENT_AMBULATORY_CARE_PROVIDER_SITE_OTHER): Payer: Medicaid Other | Admitting: Obstetrics and Gynecology

## 2017-03-17 VITALS — BP 112/66 | HR 98 | Wt 247.6 lb

## 2017-03-17 DIAGNOSIS — Z3480 Encounter for supervision of other normal pregnancy, unspecified trimester: Secondary | ICD-10-CM | POA: Insufficient documentation

## 2017-03-17 DIAGNOSIS — O219 Vomiting of pregnancy, unspecified: Secondary | ICD-10-CM | POA: Diagnosis not present

## 2017-03-17 DIAGNOSIS — Z113 Encounter for screening for infections with a predominantly sexual mode of transmission: Secondary | ICD-10-CM | POA: Diagnosis present

## 2017-03-17 DIAGNOSIS — Z3483 Encounter for supervision of other normal pregnancy, third trimester: Secondary | ICD-10-CM | POA: Diagnosis not present

## 2017-03-17 DIAGNOSIS — Z331 Pregnant state, incidental: Secondary | ICD-10-CM | POA: Diagnosis not present

## 2017-03-17 MED ORDER — ESOMEPRAZOLE MAGNESIUM 20 MG PO CPDR: 20.0000 mg | DELAYED_RELEASE_CAPSULE | Freq: Every day | ORAL | 1 refills | Status: DC

## 2017-03-17 MED ORDER — PROCHLORPERAZINE MALEATE 10 MG PO TABS: 10.0000 mg | ORAL_TABLET | Freq: Four times a day (QID) | ORAL | 3 refills | Status: DC | PRN

## 2017-03-17 NOTE — Progress Notes (Signed)
Prenatal Visit Note Date: 03/17/2017 Clinic: [facility]  Subjective:  Chloe Rodgers is a 26 y.o. [id number] at 5864w4d being seen today for ongoing prenatal care.  She is currently monitored for the following issues for this low-risk pregnancy and has Seizure disorder (HCC); Family history of breast cancer in mother; Previous cesarean delivery, antepartum condition or complication; Obese; Migraines; Chlamydia infection affecting pregnancy in first trimester; Supervision of normal pregnancy; and Depression affecting pregnancy in second trimester, antepartum on her problem list.  Patient reports no complaints.   Contractions: Irregular. Vag. Bleeding: None.  Movement: Present. Denies leaking of fluid.   The following portions of the patient's history were reviewed and updated as appropriate: allergies, current medications, past family history, past medical history, past social history, past surgical history and problem list. Problem list updated.  Objective:   Vitals:   03/17/17 0907  BP: 112/66  Pulse: 98  Weight: 247 lb 9.6 oz (112.3 kg)    Fetal Status: Fetal Heart Rate (bpm): 160 Fundal Height: 36 cm Movement: Present  Presentation: Vertex  General:  Alert, oriented and cooperative. Patient is in no acute distress.  Skin: Skin is warm and dry. No rash noted.   Cardiovascular: Normal heart rate noted  Respiratory: Normal respiratory effort, no problems with respiration noted  Abdomen: Soft, gravid, appropriate for gestational age. Pain/Pressure: Present     Pelvic:  Cervical exam deferred        Extremities: Normal range of motion.  Edema: Trace  Mental Status: Normal mood and affect. Normal behavior. Normal judgment and thought content.   Urinalysis:      Assessment and Plan:  Pregnancy: [id number] at 6264w4d  1. Supervision of other normal pregnancy, antepartum Routine care. D/w her re: BC and she'd like something that wouldn't put her at risk for weight gain. D/w  her re: IUD - Culture, beta strep (group b only) - Cervicovaginal ancillary only  2. Nausea and vomiting during pregnancy Change to nexium and compazine. Pt on protonix and zofran odt and phenergan. Ongoing, chronic issue. No sick contacts.  - CMP and Liver - CBC - Amylase - Lipase  Preterm labor symptoms and general obstetric precautions including but not limited to vaginal bleeding, contractions, leaking of fluid and fetal movement were reviewed in detail with the patient. Please refer to After Visit Summary for other counseling recommendations.  Return in about 1 week (around 03/24/2017).   Telford BingPickens, Jairen Goldfarb, MD

## 2017-03-17 NOTE — Progress Notes (Signed)
Patient here today as a routine OB 2241w4d. Patient complain of having lots of diarrhea and cramping several times a day for 2 weeks.

## 2017-03-18 LAB — CMP AND LIVER
ALT: 37 IU/L — ABNORMAL HIGH (ref 0–32)
AST: 22 IU/L (ref 0–40)
Albumin: 3.4 g/dL — ABNORMAL LOW (ref 3.5–5.5)
Alkaline Phosphatase: 115 IU/L (ref 39–117)
BUN: 7 mg/dL (ref 6–20)
Bilirubin Total: 0.3 mg/dL (ref 0.0–1.2)
Bilirubin, Direct: 0.11 mg/dL (ref 0.00–0.40)
CO2: 21 mmol/L (ref 20–29)
Calcium: 8.7 mg/dL (ref 8.7–10.2)
Chloride: 105 mmol/L (ref 96–106)
Creatinine, Ser: 0.62 mg/dL (ref 0.57–1.00)
GFR calc Af Amer: 144 mL/min/{1.73_m2} (ref >59)
GFR calc non Af Amer: 125 mL/min/{1.73_m2} (ref >59)
Glucose: 121 mg/dL — ABNORMAL HIGH (ref 65–99)
Potassium: 3.8 mmol/L (ref 3.5–5.2)
Sodium: 139 mmol/L (ref 134–144)
Total Protein: 6.2 g/dL (ref 6.0–8.5)

## 2017-03-18 LAB — AMYLASE: Amylase: 49 U/L (ref 31–124)

## 2017-03-18 LAB — CERVICOVAGINAL ANCILLARY ONLY
Chlamydia: NEGATIVE
Neisseria Gonorrhea: NEGATIVE
Trichomonas: NEGATIVE

## 2017-03-18 LAB — LIPASE: Lipase: 16 U/L (ref 14–72)

## 2017-03-18 LAB — CBC
Hematocrit: 31.1 % — ABNORMAL LOW (ref 34.0–46.6)
Hemoglobin: 9.9 g/dL — ABNORMAL LOW (ref 11.1–15.9)
MCH: 25.8 pg — ABNORMAL LOW (ref 26.6–33.0)
MCHC: 31.8 g/dL (ref 31.5–35.7)
MCV: 81 fL (ref 79–97)
Platelets: 175 10*3/uL (ref 150–379)
RBC: 3.84 x10E6/uL (ref 3.77–5.28)
RDW: 14.8 % (ref 12.3–15.4)
WBC: 5.7 10*3/uL (ref 3.4–10.8)

## 2017-03-25 ENCOUNTER — Telehealth (HOSPITAL_COMMUNITY): Payer: Self-pay | Admitting: *Deleted

## 2017-03-25 NOTE — Telephone Encounter (Signed)
Preadmission screen  

## 2017-03-28 ENCOUNTER — Telehealth (HOSPITAL_COMMUNITY): Payer: Self-pay | Admitting: *Deleted

## 2017-03-28 ENCOUNTER — Ambulatory Visit (INDEPENDENT_AMBULATORY_CARE_PROVIDER_SITE_OTHER): Payer: Medicaid Other | Admitting: Advanced Practice Midwife

## 2017-03-28 ENCOUNTER — Encounter (HOSPITAL_COMMUNITY): Payer: Self-pay

## 2017-03-28 ENCOUNTER — Ambulatory Visit: Payer: Medicaid Other | Admitting: Clinical

## 2017-03-28 VITALS — BP 121/76 | HR 99 | Wt 247.2 lb

## 2017-03-28 DIAGNOSIS — Z3483 Encounter for supervision of other normal pregnancy, third trimester: Secondary | ICD-10-CM

## 2017-03-28 DIAGNOSIS — Z3480 Encounter for supervision of other normal pregnancy, unspecified trimester: Secondary | ICD-10-CM

## 2017-03-28 DIAGNOSIS — F39 Unspecified mood [affective] disorder: Secondary | ICD-10-CM

## 2017-03-28 DIAGNOSIS — O368130 Decreased fetal movements, third trimester, not applicable or unspecified: Secondary | ICD-10-CM

## 2017-03-28 MED ORDER — DIPHENHYDRAMINE HCL 25 MG PO CAPS: 25.0000 mg | ORAL_CAPSULE | Freq: Every evening | ORAL | 0 refills | Status: DC | PRN

## 2017-03-28 NOTE — Telephone Encounter (Signed)
Preadmission screen  

## 2017-03-28 NOTE — Addendum Note (Signed)
Addended by: Hulda Marin C on: 03/28/2017 01:36 PM   Modules accepted: Orders

## 2017-03-28 NOTE — Patient Instructions (Signed)
Third Trimester of Pregnancy The third trimester is from week 28 through week 40 (months 7 through 9). The third trimester is a time when the unborn baby (fetus) is growing rapidly. At the end of the ninth month, the fetus is about 20 inches in length and weighs 6-10 pounds. Body changes during your third trimester Your body will continue to go through many changes during pregnancy. The changes vary from woman to woman. During the third trimester:  Your weight will continue to increase. You can expect to gain 25-35 pounds (11-16 kg) by the end of the pregnancy.  You may begin to get stretch marks on your hips, abdomen, and breasts.  You may urinate more often because the fetus is moving lower into your pelvis and pressing on your bladder.  You may develop or continue to have heartburn. This is caused by increased hormones that slow down muscles in the digestive tract.  You may develop or continue to have constipation because increased hormones slow digestion and cause the muscles that push waste through your intestines to relax.  You may develop hemorrhoids. These are swollen veins (varicose veins) in the rectum that can itch or be painful.  You may develop swollen, bulging veins (varicose veins) in your legs.  You may have increased body aches in the pelvis, back, or thighs. This is due to weight gain and increased hormones that are relaxing your joints.  You may have changes in your hair. These can include thickening of your hair, rapid growth, and changes in texture. Some women also have hair loss during or after pregnancy, or hair that feels dry or thin. Your hair will most likely return to normal after your baby is born.  Your breasts will continue to grow and they will continue to become tender. A yellow fluid (colostrum) may leak from your breasts. This is the first milk you are producing for your baby.  Your belly button may stick out.  You may notice more swelling in your hands,  face, or ankles.  You may have increased tingling or numbness in your hands, arms, and legs. The skin on your belly may also feel numb.  You may feel short of breath because of your expanding uterus.  You may have more problems sleeping. This can be caused by the size of your belly, increased need to urinate, and an increase in your body's metabolism.  You may notice the fetus "dropping," or moving lower in your abdomen (lightening).  You may have increased vaginal discharge.  You may notice your joints feel loose and you may have pain around your pelvic bone.  What to expect at prenatal visits You will have prenatal exams every 2 weeks until week 36. Then you will have weekly prenatal exams. During a routine prenatal visit:  You will be weighed to make sure you and the baby are growing normally.  Your blood pressure will be taken.  Your abdomen will be measured to track your baby's growth.  The fetal heartbeat will be listened to.  Any test results from the previous visit will be discussed.  You may have a cervical check near your due date to see if your cervix has softened or thinned (effaced).  You will be tested for Group B streptococcus. This happens between 35 and 37 weeks.  Your health care provider may ask you:  What your birth plan is.  How you are feeling.  If you are feeling the baby move.  If you have had   any abnormal symptoms, such as leaking fluid, bleeding, severe headaches, or abdominal cramping.  If you are using any tobacco products, including cigarettes, chewing tobacco, and electronic cigarettes.  If you have any questions.  Other tests or screenings that may be performed during your third trimester include:  Blood tests that check for low iron levels (anemia).  Fetal testing to check the health, activity level, and growth of the fetus. Testing is done if you have certain medical conditions or if there are problems during the  pregnancy.  Nonstress test (NST). This test checks the health of your baby to make sure there are no signs of problems, such as the baby not getting enough oxygen. During this test, a belt is placed around your belly. The baby is made to move, and its heart rate is monitored during movement.  What is false labor? False labor is a condition in which you feel small, irregular tightenings of the muscles in the womb (contractions) that usually go away with rest, changing position, or drinking water. These are called Braxton Hicks contractions. Contractions may last for hours, days, or even weeks before true labor sets in. If contractions come at regular intervals, become more frequent, increase in intensity, or become painful, you should see your health care provider. What are the signs of labor?  Abdominal cramps.  Regular contractions that start at 10 minutes apart and become stronger and more frequent with time.  Contractions that start on the top of the uterus and spread down to the lower abdomen and back.  Increased pelvic pressure and dull back pain.  A watery or bloody mucus discharge that comes from the vagina.  Leaking of amniotic fluid. This is also known as your "water breaking." It could be a slow trickle or a gush. Let your health care provider know if it has a color or strange odor. If you have any of these signs, call your health care provider right away, even if it is before your due date. Follow these instructions at home: Medicines  Follow your health care provider's instructions regarding medicine use. Specific medicines may be either safe or unsafe to take during pregnancy.  Take a prenatal vitamin that contains at least 600 micrograms (mcg) of folic acid.  If you develop constipation, try taking a stool softener if your health care provider approves. Eating and drinking  Eat a balanced diet that includes fresh fruits and vegetables, whole grains, good sources of protein  such as meat, eggs, or tofu, and low-fat dairy. Your health care provider will help you determine the amount of weight gain that is right for you.  Avoid raw meat and uncooked cheese. These carry germs that can cause birth defects in the baby.  If you have low calcium intake from food, talk to your health care provider about whether you should take a daily calcium supplement.  Eat four or five small meals rather than three large meals a day.  Limit foods that are high in fat and processed sugars, such as fried and sweet foods.  To prevent constipation: ? Drink enough fluid to keep your urine clear or pale yellow. ? Eat foods that are high in fiber, such as fresh fruits and vegetables, whole grains, and beans. Activity  Exercise only as directed by your health care provider. Most women can continue their usual exercise routine during pregnancy. Try to exercise for 30 minutes at least 5 days a week. Stop exercising if you experience uterine contractions.  Avoid heavy   lifting.  Do not exercise in extreme heat or humidity, or at high altitudes.  Wear low-heel, comfortable shoes.  Practice good posture.  You may continue to have sex unless your health care provider tells you otherwise. Relieving pain and discomfort  Take frequent breaks and rest with your legs elevated if you have leg cramps or low back pain.  Take warm sitz baths to soothe any pain or discomfort caused by hemorrhoids. Use hemorrhoid cream if your health care provider approves.  Wear a good support bra to prevent discomfort from breast tenderness.  If you develop varicose veins: ? Wear support pantyhose or compression stockings as told by your healthcare provider. ? Elevate your feet for 15 minutes, 3-4 times a day. Prenatal care  Write down your questions. Take them to your prenatal visits.  Keep all your prenatal visits as told by your health care provider. This is important. Safety  Wear your seat belt at  all times when driving.  Make a list of emergency phone numbers, including numbers for family, friends, the hospital, and police and fire departments. General instructions  Avoid cat litter boxes and soil used by cats. These carry germs that can cause birth defects in the baby. If you have a cat, ask someone to clean the litter box for you.  Do not travel far distances unless it is absolutely necessary and only with the approval of your health care provider.  Do not use hot tubs, steam rooms, or saunas.  Do not drink alcohol.  Do not use any products that contain nicotine or tobacco, such as cigarettes and e-cigarettes. If you need help quitting, ask your health care provider.  Do not use any medicinal herbs or unprescribed drugs. These chemicals affect the formation and growth of the baby.  Do not douche or use tampons or scented sanitary pads.  Do not cross your legs for long periods of time.  To prepare for the arrival of your baby: ? Take prenatal classes to understand, practice, and ask questions about labor and delivery. ? Make a trial run to the hospital. ? Visit the hospital and tour the maternity area. ? Arrange for maternity or paternity leave through employers. ? Arrange for family and friends to take care of pets while you are in the hospital. ? Purchase a rear-facing car seat and make sure you know how to install it in your car. ? Pack your hospital bag. ? Prepare the baby's nursery. Make sure to remove all pillows and stuffed animals from the baby's crib to prevent suffocation.  Visit your dentist if you have not gone during your pregnancy. Use a soft toothbrush to brush your teeth and be gentle when you floss. Contact a health care provider if:  You are unsure if you are in labor or if your water has broken.  You become dizzy.  You have mild pelvic cramps, pelvic pressure, or nagging pain in your abdominal area.  You have lower back pain.  You have persistent  nausea, vomiting, or diarrhea.  You have an unusual or bad smelling vaginal discharge.  You have pain when you urinate. Get help right away if:  Your water breaks before 37 weeks.  You have regular contractions less than 5 minutes apart before 37 weeks.  You have a fever.  You are leaking fluid from your vagina.  You have spotting or bleeding from your vagina.  You have severe abdominal pain or cramping.  You have rapid weight loss or weight gain.    You have shortness of breath with chest pain.  You notice sudden or extreme swelling of your face, hands, ankles, feet, or legs.  Your baby makes fewer than 10 movements in 2 hours.  You have severe headaches that do not go away when you take medicine.  You have vision changes. Summary  The third trimester is from week 28 through week 40, months 7 through 9. The third trimester is a time when the unborn baby (fetus) is growing rapidly.  During the third trimester, your discomfort may increase as you and your baby continue to gain weight. You may have abdominal, leg, and back pain, sleeping problems, and an increased need to urinate.  During the third trimester your breasts will keep growing and they will continue to become tender. A yellow fluid (colostrum) may leak from your breasts. This is the first milk you are producing for your baby.  False labor is a condition in which you feel small, irregular tightenings of the muscles in the womb (contractions) that eventually go away. These are called Braxton Hicks contractions. Contractions may last for hours, days, or even weeks before true labor sets in.  Signs of labor can include: abdominal cramps; regular contractions that start at 10 minutes apart and become stronger and more frequent with time; watery or bloody mucus discharge that comes from the vagina; increased pelvic pressure and dull back pain; and leaking of amniotic fluid. This information is not intended to replace advice  given to you by your health care provider. Make sure you discuss any questions you have with your health care provider. Document Released: 06/22/2001 Document Revised: 12/04/2015 Document Reviewed: 08/29/2012 [employer] Interactive Patient Education  2017 [employer]  

## 2017-03-28 NOTE — BH Specialist Note (Signed)
Integrated Behavioral Health Initial Visit  MRN: [id number] Name: Chloe Rodgers   Session Start time: 10:20 Session End time: 10:34 Total time: 15 minutes  Type of Service: Integrated Behavioral Health- Individual/Family Interpretor:No. Interpretor Name and Language: n/a   Warm Hand Off Completed.       SUBJECTIVE: Chloe Rodgers is a 26 y.o. female accompanied by patient. Patient was referred by Chloe Rodgers, CNM for depression and anxiety. Patient reports the following symptoms/concerns: Pt states that her primary concern today is feeling stress over FOB out of incarceration; she is currently working with [facility], has a weekly therapist at [facility] of the [place], and has been referred to a psychiatrist to begin [facility] meds postpartum, to treat self-reported bipolar affective disorder. Pt has felt more depressed this pregnancy than previously, and has not slept well knowing FOB is out. Pt requests home visits postpartum.  Duration of problem: Over one month increase; Severity of problem: moderate  OBJECTIVE: Mood: Anxious and Depressed and Affect: Depressed Risk of harm to self or others: No plan to harm self or others   LIFE CONTEXT: Family and Social: Lives with 4 and 6yo daughters School/Work: - Self-Care: - Life Changes: Current pregnancy, and FOB released from incarceration about one month ago  GOALS ADDRESSED: Patient will reduce symptoms of: anxiety, depression, mood instability and stress and increase knowledge and/or ability of: self-management skills and also: Increase healthy adjustment to current life circumstances and Increase adequate support systems for patient/family   INTERVENTIONS: Supportive Counseling and Psychoeducation and/or Health Education  Standardized Assessments completed: GAD-7 and PHQ 9  ASSESSMENT: Patient currently experiencing Mood disorder(pt self-reports bipolar affective disorder). Patient may benefit from  psychoeducation and brief therapeutic interventions regarding coping with symptoms of anxiety and depression related to mood disorder.  PLAN: 1. Follow up with behavioral health clinician on : As needed; brief f/u at postpartum visit 2. Behavioral recommendations:  -Continue attending weekly therapy at [facility] of the [place] -Continue with plans to attend appointment with psychiatry at [facility] of the [place], for [facility] med management postpartum -Consider apps as additional self-coping, including sleep app for improved family sleep -Read educational material regarding coping with symptoms of depression, and anxiety with panic attacks -Prior to discharge from hospital postpartum, talk to CSW about appropriate home visit options 3. Referral(s): Integrated Behavioral Health Services (In Clinic) 4. "From scale of 1-10, how likely are you to follow plan?": 8  Rae Lips, LCSWA   Depression screen [facility] 2/9 03/28/2017 02/15/2017 02/04/2017 01/05/2017 10/13/2016  Decreased Interest Down, Depressed, Hopeless PHQ - 2 Score Altered sleeping Tired, decreased energy Change in appetite 0 1 0 0 3  Feeling bad or failure about yourself  0 0 0 0 3  Trouble concentrating Moving slowly or fidgety/restless 0 0 0 0 3  Suicidal thoughts 0 0 0 0 0  PHQ-9 Score GAD 7 : Generalized Anxiety Score 03/28/2017 02/15/2017 02/04/2017 01/05/2017  Nervous, Anxious, on Edge Control/stop worrying Worry too much - different things Trouble relaxing Restless Easily annoyed or irritable Afraid - awful might  happen 0 0 0 0  Total GAD 7 Score 6

## 2017-03-28 NOTE — Progress Notes (Signed)
   PRENATAL VISIT NOTE  Subjective:  Chloe Rodgers is a 26 y.o. [phone] at [redacted]w[redacted]d being seen today for ongoing prenatal care.  She is currently monitored for the following issues for this high-risk pregnancy and has Seizure disorder (HCC); Family history of breast cancer in mother; Previous cesarean delivery, antepartum condition or complication; Obese; Migraines; Chlamydia infection affecting pregnancy in first trimester; Supervision of normal pregnancy; and Depression affecting pregnancy in second trimester, antepartum on her problem list.  Patient reports occasional contractions.  Contractions: Irregular. Vag. Bleeding: None, Scant.  Movement: (!) Decreased. Denies leaking of fluid.   The following portions of the patient's history were reviewed and updated as appropriate: allergies, current medications, past family history, past medical history, past social history, past surgical history and problem list. Problem list updated.  Objective:   Vitals:   03/28/17 1009  BP: 121/76  Pulse: 99  Weight: 247 lb 3.2 oz (112.1 kg)    Fetal Status: Fetal Heart Rate (bpm): 164   Movement: (!) Decreased     General:  Alert, oriented and cooperative. Patient is in no acute distress.  Skin: Skin is warm and dry. No rash noted.   Cardiovascular: Normal heart rate noted  Respiratory: Normal respiratory effort, no problems with respiration noted  Abdomen: Soft, gravid, appropriate for gestational age.  Pain/Pressure: Present     Pelvic: Cervical exam performed      Cervix FT/50/Ballot  Extremities: Normal range of motion.  Edema: Trace  Mental Status:  Normal mood and affect. Normal behavior. Normal judgment and thought content.   Assessment and Plan:  Pregnancy: [id number] at [redacted]w[redacted]d  1. Decreased fetal movements in third trimester, single or unspecified fetus     NST reactive, with occasional contractions - Fetal nonstress test  2. Supervision of other normal pregnancy, antepartum     GBS  repeated due to last one not being run - Culture, beta strep (group b only)  Term labor symptoms and general obstetric precautions including but not limited to vaginal bleeding, contractions, leaking of fluid and fetal movement were reviewed in detail with the patient. Please refer to After Visit Summary for other counseling recommendations.  Has C/S scheduled with Dr Emelda Fear   Wynelle Bourgeois, CNM

## 2017-03-30 ENCOUNTER — Inpatient Hospital Stay (HOSPITAL_COMMUNITY)
Admission: RE | Admit: 2017-03-30 | Payer: Medicaid Other | Source: Ambulatory Visit | Admitting: Obstetrics and Gynecology

## 2017-03-30 ENCOUNTER — Inpatient Hospital Stay (HOSPITAL_COMMUNITY): Payer: Medicaid Other | Admitting: Anesthesiology

## 2017-03-30 ENCOUNTER — Encounter (HOSPITAL_COMMUNITY)
Admission: AD | Disposition: A | Discharge status: Home / Self Care | Payer: Self-pay | Source: Ambulatory Visit | Attending: Obstetrics & Gynecology

## 2017-03-30 ENCOUNTER — Inpatient Hospital Stay (HOSPITAL_COMMUNITY)
Admission: AD | Admit: 2017-03-30 | Discharge: 2017-04-02 | DRG: 765 | Disposition: A | Discharge status: Home / Self Care | Payer: Medicaid Other | Source: Ambulatory Visit | Attending: Obstetrics & Gynecology | Admitting: Obstetrics & Gynecology

## 2017-03-30 ENCOUNTER — Encounter (HOSPITAL_COMMUNITY): Payer: Self-pay | Admitting: *Deleted

## 2017-03-30 DIAGNOSIS — Z98891 History of uterine scar from previous surgery: Secondary | ICD-10-CM

## 2017-03-30 DIAGNOSIS — K219 Gastro-esophageal reflux disease without esophagitis: Secondary | ICD-10-CM | POA: Diagnosis present

## 2017-03-30 DIAGNOSIS — G40909 Epilepsy, unspecified, not intractable, without status epilepticus: Secondary | ICD-10-CM

## 2017-03-30 DIAGNOSIS — O99214 Obesity complicating childbirth: Secondary | ICD-10-CM | POA: Diagnosis present

## 2017-03-30 DIAGNOSIS — D649 Anemia, unspecified: Secondary | ICD-10-CM | POA: Diagnosis present

## 2017-03-30 DIAGNOSIS — O9902 Anemia complicating childbirth: Secondary | ICD-10-CM | POA: Diagnosis present

## 2017-03-30 DIAGNOSIS — Z6841 Body Mass Index (BMI) 40.0 and over, adult: Secondary | ICD-10-CM

## 2017-03-30 DIAGNOSIS — A749 Chlamydial infection, unspecified: Secondary | ICD-10-CM

## 2017-03-30 DIAGNOSIS — Z3A38 38 weeks gestation of pregnancy: Secondary | ICD-10-CM

## 2017-03-30 DIAGNOSIS — O98811 Other maternal infectious and parasitic diseases complicating pregnancy, first trimester: Secondary | ICD-10-CM

## 2017-03-30 DIAGNOSIS — O34219 Maternal care for unspecified type scar from previous cesarean delivery: Secondary | ICD-10-CM

## 2017-03-30 DIAGNOSIS — F329 Major depressive disorder, single episode, unspecified: Secondary | ICD-10-CM

## 2017-03-30 DIAGNOSIS — Z803 Family history of malignant neoplasm of breast: Secondary | ICD-10-CM

## 2017-03-30 DIAGNOSIS — O34211 Maternal care for low transverse scar from previous cesarean delivery: Principal | ICD-10-CM | POA: Diagnosis present

## 2017-03-30 DIAGNOSIS — F32A Depression, unspecified: Secondary | ICD-10-CM

## 2017-03-30 DIAGNOSIS — O99342 Other mental disorders complicating pregnancy, second trimester: Secondary | ICD-10-CM

## 2017-03-30 LAB — CBC
HCT: 34.6 % — ABNORMAL LOW (ref 36.0–46.0)
Hemoglobin: 11.4 g/dL — ABNORMAL LOW (ref 12.0–15.0)
MCH: 26.6 pg (ref 26.0–34.0)
MCHC: 32.9 g/dL (ref 30.0–36.0)
MCV: 80.7 fL (ref 78.0–100.0)
Platelets: 163 10*3/uL (ref 150–400)
RBC: 4.29 MIL/uL (ref 3.87–5.11)
RDW: 15.2 % (ref 11.5–15.5)
WBC: 8.3 10*3/uL (ref 4.0–10.5)

## 2017-03-30 LAB — TYPE AND SCREEN
ABO/RH(D): A POS
Antibody Screen: NEGATIVE

## 2017-03-30 SURGERY — Surgical Case
Anesthesia: Spinal

## 2017-03-30 MED ORDER — SENNOSIDES-DOCUSATE SODIUM 8.6-50 MG PO TABS: 2.0000 | ORAL_TABLET | ORAL | Status: DC

## 2017-03-30 MED ORDER — DIPHENHYDRAMINE HCL 25 MG PO CAPS: 25.0000 mg | ORAL_CAPSULE | ORAL | Status: DC | PRN

## 2017-03-30 MED ORDER — OXYTOCIN 40 UNITS IN LACTATED RINGERS INFUSION - SIMPLE MED: 2.5000 [IU]/h | INTRAVENOUS | Status: AC

## 2017-03-30 MED ORDER — PHENYLEPHRINE 8 MG IN D5W 100 ML (0.08MG/ML) PREMIX OPTIME
INJECTION | INTRAVENOUS | Status: DC | PRN
  Administered 2017-03-30: 60 ug/min via INTRAVENOUS

## 2017-03-30 MED ORDER — WITCH HAZEL-GLYCERIN EX PADS: 1.0000 "application " | MEDICATED_PAD | CUTANEOUS | Status: DC | PRN

## 2017-03-30 MED ORDER — OXYTOCIN 10 UNIT/ML IJ SOLN
INTRAMUSCULAR | Status: AC
  Filled 2017-03-30: qty 4

## 2017-03-30 MED ORDER — LACTATED RINGERS IV SOLN
INTRAVENOUS | Status: DC
  Administered 2017-03-31: 06:00:00 via INTRAVENOUS

## 2017-03-30 MED ORDER — LACTATED RINGERS IV BOLUS (SEPSIS): 1000.0000 mL | Freq: Once | INTRAVENOUS | Status: DC

## 2017-03-30 MED ORDER — SIMETHICONE 80 MG PO CHEW
80.0000 mg | CHEWABLE_TABLET | Freq: Three times a day (TID) | ORAL | Status: DC
  Administered 2017-03-31 – 2017-04-02 (×7): 80 mg via ORAL
  Filled 2017-03-30 (×7): qty 1

## 2017-03-30 MED ORDER — NALBUPHINE HCL 10 MG/ML IJ SOLN: 5.0000 mg | INTRAMUSCULAR | Status: DC | PRN

## 2017-03-30 MED ORDER — OXYCODONE HCL 5 MG/5ML PO SOLN: 5.0000 mg | Freq: Once | ORAL | Status: DC | PRN

## 2017-03-30 MED ORDER — FAMOTIDINE IN NACL 20-0.9 MG/50ML-% IV SOLN
20.0000 mg | Freq: Once | INTRAVENOUS | Status: AC
  Administered 2017-03-30: 20 mg via INTRAVENOUS
  Filled 2017-03-30: qty 50

## 2017-03-30 MED ORDER — PRENATAL MULTIVITAMIN CH
1.0000 | ORAL_TABLET | Freq: Every day | ORAL | Status: DC
  Administered 2017-03-31 – 2017-04-02 (×3): 1 via ORAL
  Filled 2017-03-30 (×3): qty 1

## 2017-03-30 MED ORDER — NALOXONE HCL 0.4 MG/ML IJ SOLN: 0.4000 mg | INTRAMUSCULAR | Status: DC | PRN

## 2017-03-30 MED ORDER — DIPHENHYDRAMINE HCL 25 MG PO CAPS: 25.0000 mg | ORAL_CAPSULE | Freq: Four times a day (QID) | ORAL | Status: DC | PRN

## 2017-03-30 MED ORDER — ONDANSETRON HCL 4 MG/2ML IJ SOLN
INTRAMUSCULAR | Status: AC
  Filled 2017-03-30: qty 2

## 2017-03-30 MED ORDER — ZOLPIDEM TARTRATE 5 MG PO TABS: 5.0000 mg | ORAL_TABLET | Freq: Every evening | ORAL | Status: DC | PRN

## 2017-03-30 MED ORDER — OXYCODONE HCL 5 MG PO TABS
5.0000 mg | ORAL_TABLET | ORAL | Status: DC | PRN
Start: 2017-03-30 — End: 2017-04-02

## 2017-03-30 MED ORDER — SOD CITRATE-CITRIC ACID 500-334 MG/5ML PO SOLN
30.0000 mL | Freq: Once | ORAL | Status: AC
  Administered 2017-03-30: 30 mL via ORAL
  Filled 2017-03-30: qty 15

## 2017-03-30 MED ORDER — NALBUPHINE HCL 10 MG/ML IJ SOLN
5.0000 mg | Freq: Once | INTRAMUSCULAR | Status: AC | PRN
  Administered 2017-03-31: 5 mg via INTRAVENOUS
  Filled 2017-03-30: qty 1

## 2017-03-30 MED ORDER — LACTATED RINGERS IV SOLN
INTRAVENOUS | Status: DC
  Administered 2017-03-30: 17:00:00 via INTRAVENOUS

## 2017-03-30 MED ORDER — OXYCODONE HCL 5 MG PO TABS
10.0000 mg | ORAL_TABLET | ORAL | Status: DC | PRN
  Administered 2017-03-31 – 2017-04-02 (×10): 10 mg via ORAL
  Filled 2017-03-30 (×10): qty 2

## 2017-03-30 MED ORDER — NALBUPHINE HCL 10 MG/ML IJ SOLN: 5.0000 mg | Freq: Once | INTRAMUSCULAR | Status: AC | PRN

## 2017-03-30 MED ORDER — FENTANYL CITRATE (PF) 100 MCG/2ML IJ SOLN
INTRAMUSCULAR | Status: AC
  Filled 2017-03-30: qty 2

## 2017-03-30 MED ORDER — OXYTOCIN 10 UNIT/ML IJ SOLN
INTRAVENOUS | Status: DC | PRN
  Administered 2017-03-30: 40 [IU] via INTRAVENOUS

## 2017-03-30 MED ORDER — SODIUM CHLORIDE 0.9% FLUSH: 3.0000 mL | INTRAVENOUS | Status: DC | PRN

## 2017-03-30 MED ORDER — KETOROLAC TROMETHAMINE 30 MG/ML IJ SOLN: 30.0000 mg | Freq: Four times a day (QID) | INTRAMUSCULAR | Status: AC | PRN

## 2017-03-30 MED ORDER — SCOPOLAMINE 1 MG/3DAYS TD PT72
MEDICATED_PATCH | TRANSDERMAL | Status: DC | PRN
  Administered 2017-03-30: 1 via TRANSDERMAL

## 2017-03-30 MED ORDER — MORPHINE SULFATE (PF) 0.5 MG/ML IJ SOLN
INTRAMUSCULAR | Status: AC
  Filled 2017-03-30: qty 10

## 2017-03-30 MED ORDER — BUPIVACAINE IN DEXTROSE 0.75-8.25 % IT SOLN
INTRATHECAL | Status: DC | PRN
  Administered 2017-03-30: 1.6 mL via INTRATHECAL

## 2017-03-30 MED ORDER — FENTANYL CITRATE (PF) 100 MCG/2ML IJ SOLN
INTRAMUSCULAR | Status: DC | PRN
  Administered 2017-03-30: 10 ug via INTRATHECAL

## 2017-03-30 MED ORDER — KETOROLAC TROMETHAMINE 30 MG/ML IJ SOLN
INTRAMUSCULAR | Status: AC
  Filled 2017-03-30: qty 1

## 2017-03-30 MED ORDER — DIBUCAINE 1 % RE OINT: 1.0000 "application " | TOPICAL_OINTMENT | RECTAL | Status: DC | PRN

## 2017-03-30 MED ORDER — HYDROMORPHONE HCL 1 MG/ML IJ SOLN: 0.2500 mg | INTRAMUSCULAR | Status: DC | PRN

## 2017-03-30 MED ORDER — COCONUT OIL OIL: 1.0000 "application " | TOPICAL_OIL | Status: DC | PRN

## 2017-03-30 MED ORDER — KETOROLAC TROMETHAMINE 30 MG/ML IJ SOLN: 30.0000 mg | Freq: Four times a day (QID) | INTRAMUSCULAR | Status: DC | PRN

## 2017-03-30 MED ORDER — OXYCODONE HCL 5 MG PO TABS: 5.0000 mg | ORAL_TABLET | Freq: Once | ORAL | Status: DC | PRN

## 2017-03-30 MED ORDER — LACTATED RINGERS IV SOLN
INTRAVENOUS | Status: DC
  Administered 2017-03-30 (×2): via INTRAVENOUS

## 2017-03-30 MED ORDER — ONDANSETRON HCL 4 MG/2ML IJ SOLN
INTRAMUSCULAR | Status: DC | PRN
  Administered 2017-03-30: 4 mg via INTRAVENOUS

## 2017-03-30 MED ORDER — TETANUS-DIPHTH-ACELL PERTUSSIS 5-2.5-18.5 LF-MCG/0.5 IM SUSP: 0.5000 mL | Freq: Once | INTRAMUSCULAR | Status: DC

## 2017-03-30 MED ORDER — NALOXONE HCL 2 MG/2ML IJ SOSY: 1.0000 ug/kg/h | PREFILLED_SYRINGE | INTRAVENOUS | Status: DC | PRN

## 2017-03-30 MED ORDER — PROMETHAZINE HCL 25 MG/ML IJ SOLN: 6.2500 mg | INTRAMUSCULAR | Status: DC | PRN

## 2017-03-30 MED ORDER — MORPHINE SULFATE (PF) 0.5 MG/ML IJ SOLN
INTRAMUSCULAR | Status: DC | PRN
  Administered 2017-03-30: .2 mg via INTRATHECAL

## 2017-03-30 MED ORDER — IBUPROFEN 600 MG PO TABS
600.0000 mg | ORAL_TABLET | Freq: Four times a day (QID) | ORAL | Status: DC
  Administered 2017-03-31 – 2017-04-02 (×10): 600 mg via ORAL
  Filled 2017-03-30 (×10): qty 1

## 2017-03-30 MED ORDER — PHENYLEPHRINE 8 MG IN D5W 100 ML (0.08MG/ML) PREMIX OPTIME
INJECTION | INTRAVENOUS | Status: AC
  Filled 2017-03-30: qty 100

## 2017-03-30 MED ORDER — ACETAMINOPHEN 500 MG PO TABS
1000.0000 mg | ORAL_TABLET | Freq: Four times a day (QID) | ORAL | Status: AC
  Administered 2017-03-30 – 2017-03-31 (×3): 1000 mg via ORAL
  Filled 2017-03-30 (×2): qty 2

## 2017-03-30 MED ORDER — SIMETHICONE 80 MG PO CHEW: 80.0000 mg | CHEWABLE_TABLET | ORAL | Status: DC | PRN

## 2017-03-30 MED ORDER — TERBUTALINE SULFATE 1 MG/ML IJ SOLN
0.2500 mg | Freq: Once | INTRAMUSCULAR | Status: AC
  Administered 2017-03-30: 0.25 mg via SUBCUTANEOUS
  Filled 2017-03-30: qty 1

## 2017-03-30 MED ORDER — ACETAMINOPHEN 325 MG PO TABS
650.0000 mg | ORAL_TABLET | ORAL | Status: DC | PRN
  Administered 2017-04-01 – 2017-04-02 (×4): 650 mg via ORAL
  Filled 2017-03-30 (×4): qty 2

## 2017-03-30 MED ORDER — MENTHOL 3 MG MT LOZG: 1.0000 | LOZENGE | OROMUCOSAL | Status: DC | PRN

## 2017-03-30 MED ORDER — ACETAMINOPHEN 500 MG PO TABS
ORAL_TABLET | ORAL | Status: AC
  Administered 2017-03-30: 1000 mg via ORAL
  Filled 2017-03-30: qty 2

## 2017-03-30 MED ORDER — ONDANSETRON HCL 4 MG/2ML IJ SOLN: 4.0000 mg | Freq: Three times a day (TID) | INTRAMUSCULAR | Status: DC | PRN

## 2017-03-30 MED ORDER — CEFAZOLIN SODIUM-DEXTROSE 2-4 GM/100ML-% IV SOLN: 2.0000 g | INTRAVENOUS | Status: DC

## 2017-03-30 MED ORDER — SCOPOLAMINE 1 MG/3DAYS TD PT72: 1.0000 | MEDICATED_PATCH | Freq: Once | TRANSDERMAL | Status: DC

## 2017-03-30 MED ORDER — SCOPOLAMINE 1 MG/3DAYS TD PT72
MEDICATED_PATCH | TRANSDERMAL | Status: AC
  Filled 2017-03-30: qty 1

## 2017-03-30 MED ORDER — BUPIVACAINE IN DEXTROSE 0.75-8.25 % IT SOLN
INTRATHECAL | Status: AC
  Filled 2017-03-30: qty 2

## 2017-03-30 MED ORDER — CEFAZOLIN SODIUM-DEXTROSE 2-4 GM/100ML-% IV SOLN
2.0000 g | INTRAVENOUS | Status: AC
  Administered 2017-03-30: 2 g via INTRAVENOUS
  Filled 2017-03-30: qty 100

## 2017-03-30 MED ORDER — SIMETHICONE 80 MG PO CHEW: 80.0000 mg | CHEWABLE_TABLET | ORAL | Status: DC

## 2017-03-30 MED ORDER — DIPHENHYDRAMINE HCL 50 MG/ML IJ SOLN
12.5000 mg | INTRAMUSCULAR | Status: DC | PRN
Start: 2017-03-30 — End: 2017-03-31

## 2017-03-30 SURGICAL SUPPLY — 37 items
CHLORAPREP W/TINT 26ML (MISCELLANEOUS) ×6 IMPLANT
CLAMP CORD UMBIL (MISCELLANEOUS) IMPLANT
CLOTH BEACON ORANGE TIMEOUT ST (SAFETY) ×3 IMPLANT
DERMABOND ADVANCED (GAUZE/BANDAGES/DRESSINGS) ×4
DERMABOND ADVANCED .7 DNX12 (GAUZE/BANDAGES/DRESSINGS) ×2 IMPLANT
DRSG OPSITE POSTOP 4X10 (GAUZE/BANDAGES/DRESSINGS) ×3 IMPLANT
ELECT REM PT RETURN 9FT ADLT (ELECTROSURGICAL) ×3
ELECTRODE REM PT RTRN 9FT ADLT (ELECTROSURGICAL) ×1 IMPLANT
EXTRACTOR VACUUM BELL STYLE (SUCTIONS) ×3 IMPLANT
GLOVE BIOGEL PI IND STRL 7.0 (GLOVE) ×1 IMPLANT
GLOVE BIOGEL PI IND STRL 8 (GLOVE) ×1 IMPLANT
GLOVE BIOGEL PI INDICATOR 7.0 (GLOVE) ×2
GLOVE BIOGEL PI INDICATOR 8 (GLOVE) ×2
GLOVE ECLIPSE 8.0 STRL XLNG CF (GLOVE) ×3 IMPLANT
GOWN STRL REUS W/TWL LRG LVL3 (GOWN DISPOSABLE) ×6 IMPLANT
KIT ABG SYR 3ML LUER SLIP (SYRINGE) ×3 IMPLANT
NEEDLE HYPO 18GX1.5 BLUNT FILL (NEEDLE) ×3 IMPLANT
NEEDLE HYPO 22GX1.5 SAFETY (NEEDLE) ×3 IMPLANT
NEEDLE HYPO 25X5/8 SAFETYGLIDE (NEEDLE) ×3 IMPLANT
NS IRRIG 1000ML POUR BTL (IV SOLUTION) ×3 IMPLANT
PACK C SECTION WH (CUSTOM PROCEDURE TRAY) ×3 IMPLANT
PAD OB MATERNITY 4.3X12.25 (PERSONAL CARE ITEMS) ×3 IMPLANT
PENCIL SMOKE EVAC W/HOLSTER (ELECTROSURGICAL) ×3 IMPLANT
RTRCTR C-SECT PINK 25CM LRG (MISCELLANEOUS) IMPLANT
SUT CHROMIC 0 CT 1 (SUTURE) ×3 IMPLANT
SUT MNCRL 0 VIOLET CTX 36 (SUTURE) ×2 IMPLANT
SUT MONOCRYL 0 CTX 36 (SUTURE) ×4
SUT PLAIN 2 0 (SUTURE)
SUT PLAIN 2 0 XLH (SUTURE) IMPLANT
SUT PLAIN ABS 2-0 CT1 27XMFL (SUTURE) IMPLANT
SUT VIC AB 0 CTX 36 (SUTURE) ×2
SUT VIC AB 0 CTX36XBRD ANBCTRL (SUTURE) ×1 IMPLANT
SUT VIC AB 4-0 KS 27 (SUTURE) IMPLANT
SYR 20CC LL (SYRINGE) ×6 IMPLANT
TOWEL OR 17X24 6PK STRL BLUE (TOWEL DISPOSABLE) ×3 IMPLANT
TRAY FOLEY BAG SILVER LF 14FR (SET/KITS/TRAYS/PACK) IMPLANT
WATER STERILE IRR 1000ML POUR (IV SOLUTION) ×3 IMPLANT

## 2017-03-30 NOTE — Transfer of Care (Signed)
Immediate Anesthesia Transfer of Care Note  Patient: Chloe Rodgers  Procedure(s) Performed: Procedure(s): REPEAT CESAREAN SECTION (N/A)  Patient Location: PACU  Anesthesia Type:Spinal  Level of Consciousness: awake, alert  and oriented  Airway & Oxygen Therapy: Patient Spontanous Breathing  Post-op Assessment: Report given to RN and Post -op Vital signs reviewed and stable  Post vital signs: Reviewed and stable  Last Vitals:  Vitals:   03/30/17 1525  BP: 120/66  Pulse: (!) 108  Resp: 18  Temp: 36.8 C  SpO2: 100%    Last Pain:  Vitals:   03/30/17 1525  TempSrc: Oral  PainSc:          Complications: No apparent anesthesia complications
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
## 2017-03-30 NOTE — MAU Note (Signed)
Contractions started last night, were every 15-22min, now starting to be closer. No bleeding.  A  Little fluid is coming out, feels wet.  Was "1/2 a cm on Monday"

## 2017-03-30 NOTE — Anesthesia Postprocedure Evaluation (Signed)
Anesthesia Post Note  Patient: Chloe Rodgers  Procedure(s) Performed: Procedure(s) (LRB): REPEAT CESAREAN SECTION (N/A)     Patient location during evaluation: PACU Anesthesia Type: Spinal Level of consciousness: oriented and awake and alert Pain management: pain level controlled Vital Signs Assessment: post-procedure vital signs reviewed and stable Respiratory status: spontaneous breathing, respiratory function stable and patient connected to nasal cannula oxygen Cardiovascular status: blood pressure returned to baseline and stable Postop Assessment: no headache, no backache, no apparent nausea or vomiting, spinal receding and patient able to bend at knees Anesthetic complications: no    Last Vitals:  Vitals:   03/30/17 2030 03/30/17 2057  BP: 120/77 115/61  Pulse: 89 91  Resp: 20 20  Temp:  36.7 C  SpO2: 100% 100%    Last Pain:  Vitals:   03/30/17 2030  TempSrc:   PainSc: 6    Pain Goal:                 Cecile Hearing

## 2017-03-30 NOTE — Op Note (Signed)
Cesarean Section Operative Report  Natasia Sanko  PROCEDURE DATE: 03/30/2017  PREOPERATIVE DIAGNOSES: Intrauterine pregnancy at [redacted]w[redacted]d weeks gestation; history of prior Cesarean Section (x2)  POSTOPERATIVE DIAGNOSES: The same  PROCEDURE: Repeat Low Transverse Cesarean Section  SURGEON:   Surgeon(s) and Role:    * Eure, Amaryllis Dyke, MD - Primary - Attending     * Nolene Ebbs, MD - OB Fellow  ASSISTANT:  Nolene Ebbs, MD - OB Fellow   INDICATIONS: Chloe Rodgers is a 26 y.o. [phone] at [redacted]w[redacted]d here for cesarean section secondary to the indications listed under preoperative diagnoses; please see preoperative note for further details.  The risks of cesarean section were discussed with the patient including but were not limited to: bleeding which may require transfusion or reoperation; infection which may require antibiotics; injury to bowel, bladder, ureters or other surrounding organs; injury to the fetus; need for additional procedures including hysterectomy in the event of a life-threatening hemorrhage; placental abnormalities wth subsequent pregnancies, incisional problems, thromboembolic phenomenon and other postoperative/anesthesia complications.   The patient concurred with the proposed plan, giving informed written consent for the procedure.    FINDINGS:  Viable female infant in cephalic presentation.  Apgars 8 and 9.  Clear amniotic fluid.  Intact placenta, three vessel cord.  Normal uterus, fallopian tubes and ovaries bilaterally.  ANESTHESIA: Spinal INTRAVENOUS FLUIDS: 2300 ml ESTIMATED BLOOD LOSS: 600 ml URINE OUTPUT:  200 ml SPECIMENS: Placenta sent to L&D COMPLICATIONS: None immediate  PROCEDURE IN DETAIL:  The patient preoperatively received intravenous antibiotics and had sequential compression devices applied to her lower extremities.  She was then taken to the operating room where spinal anesthesia was administered and was found to be adequate. She was then placed in a dorsal  supine position with a leftward tilt, and prepped and draped in a sterile manner.  A foley catheter was placed into her bladder and attached to constant gravity.    After an adequate timeout was performed, a Pfannenstiel skin incision was made with scalpel over her preexisting scarand carried through to the underlying layer of fascia. The fascia was incised in the midline, and this incision was extended bilaterally using the Mayo scissors.  Kocher clamps were applied to the superior aspect of the fascial incision and the underlying rectus muscles were dissected off bluntly.  A similar process was carried out on the inferior aspect of the fascial incision. The rectus muscles were separated in the midline bluntly and the peritoneum was entered bluntly. Attention was turned to the lower uterine segment where a low transverse hysterotomy was made with a scalpel and extended bilaterally bluntly.  A vacuum was used to assist with vertex delivery; there was still some difficulty, and muscles were cut with scissors laterally. The infant was successfully delivered vacuum-assisted; the cord was clamped and cut after one minute, and the infant was handed over to the awaiting neonatology team. Cord gas and venous blood were drawn. Uterine massage was then administered, and the placenta delivered intact with a three-vessel cord. The uterus was then cleared of clots and debris.  The hysterotomy was closed with 0 Monocryl in a running locked fashion, and an imbricating layer was also placed with 0 Monocryl.  Good hemostasis achieved. The pelvis was irrigated. The peritoneum and muscles were approximated with chromic interrupted stitches. The fascia was then closed using 0 Vicryl in a running fashion. The skin was closed with a 4-0 Vicryl subcuticular stitch.   The patient tolerated the procedure well. Sponge, lap, instrument  and needle counts were correct x 3.  She was taken to the recovery room in stable condition.     Disposition: PACU - hemodynamically stable.   Maternal Condition: stable   Signed: Frederik Pear, MD OB Fellow 03/30/2017 6:24 PM

## 2017-03-30 NOTE — Anesthesia Procedure Notes (Signed)
Spinal  Patient location during procedure: OR Start time: 03/30/2017 5:00 PM End time: 03/30/2017 5:10 PM Staffing Anesthesiologist: Karna Christmas P Performed: anesthesiologist  Preanesthetic Checklist Completed: patient identified, surgical consent, pre-op evaluation, timeout performed, IV checked, risks and benefits discussed and monitors and equipment checked Spinal Block Patient position: sitting Prep: DuraPrep Patient monitoring: cardiac monitor, continuous pulse ox and blood pressure Approach: midline Location: L3-4 Injection technique: single-shot Needle Needle type: Pencan  Needle gauge: 24 G Needle length: 9 cm Assessment Sensory level: T10 Additional Notes Functioning IV was confirmed and monitors were applied. Sterile prep and drape, including hand hygiene and sterile gloves were used. The patient was positioned and the spine was prepped. The skin was anesthetized with lidocaine.  Free flow of clear CSF was obtained prior to injecting local anesthetic into the CSF.  The spinal needle aspirated freely following injection.  The needle was carefully withdrawn.  The patient tolerated the procedure well.

## 2017-03-30 NOTE — H&P (Signed)
Chloe Rodgers is a 26 y.o. female presenting for painful contractions since yesterday. Has had 2 prior C/S and planned a repeat C/S later this week . Patient Active Problem List   Diagnosis Date Noted  . Depression affecting pregnancy in second trimester, antepartum 01/05/2017  . Supervision of normal pregnancy 10/06/2016  . Chlamydia infection affecting pregnancy in first trimester 08/23/2016  . Migraines 07/24/2012  . Previous cesarean delivery, antepartum condition or complication 03/30/2012  . Obese 03/30/2012  . Seizure disorder (HCC) 02/03/2012  . Family history of breast cancer in mother 02/03/2012    OB History    Gravida Para Term Preterm AB Living   0 1 2   SAB TAB Ectopic Multiple Live Births   0 1 0 0 2     Past Medical History:  Diagnosis Date  . Anemia   . Anxiety   . Chlamydia   . Complication of anesthesia    ??, seizure with wisdom teeth  . Depression   . Migraine   . PID (acute pelvic inflammatory disease) 2014  . PONV (postoperative nausea and vomiting)   . Psoriasis   . Seizures [facility])    July 2013 - [place]   Past Surgical History:  Procedure Laterality Date  . CESAREAN SECTION    . CESAREAN SECTION  06/06/2012   Procedure: CESAREAN SECTION;  Surgeon: Adam Phenix, MD;  Location: [facility] ORS;  Service: Obstetrics;  Laterality: N/A;  . SKIN GRAFT     off abd, onto arm  . WISDOM TOOTH EXTRACTION     Family History: family history includes Asthma in her daughter; Cancer in her maternal grandmother and mother; Diabetes in her maternal grandmother and mother; Hypertension in her maternal grandmother and mother; Seizures in her mother; Stroke in her mother. Social History:  reports that she has never smoked. She has never used smokeless tobacco. She reports that she does not drink alcohol or use drugs.     Maternal Diabetes: No Genetic Screening: Normal Maternal Ultrasounds/Referrals: Normal Fetal Ultrasounds or other Referrals:   None Maternal Substance Abuse:  No Significant Maternal Medications:  None Significant Maternal Lab Results:  Lab values include: Other: GBS pending Other Comments:  Seizure disorder with no meds, previous C/S  Review of Systems  Constitutional: Negative for chills and fever.  Respiratory: Negative for shortness of breath.   Gastrointestinal: Positive for abdominal pain. Negative for constipation, diarrhea, nausea and vomiting.  Musculoskeletal: Positive for back pain.   Maternal Medical History:  Reason for admission: Contractions.  Nausea.  Contractions: Onset was 13-24 hours ago.   Frequency: regular.   Perceived severity is moderate.    Fetal activity: Perceived fetal activity is normal.   Last perceived fetal movement was within the past hour.    Prenatal complications: Bleeding.   No PIH, placental abnormality, pre-eclampsia or preterm labor.   Prenatal Complications - Diabetes: none.    Dilation: 4 Effacement (%): 70 Station: -2 Exam by:: J.Thornton< RN  Blood pressure 120/66, pulse (!) 108, temperature 98.2 F (36.8 C), temperature source Oral, resp. rate 18, weight 247 lb 12 oz (112.4 kg), last menstrual period 07/04/2016, SpO2 100 %. Maternal Exam:  Uterine Assessment: Contraction strength is moderate.  Contraction frequency is regular.   Abdomen: Patient reports no abdominal tenderness. Surgical scars: low transverse.   Fetal presentation: vertex  Introitus: Normal vulva. Vagina is positive for vaginal discharge.  Ferning test: negative.  Nitrazine test: not done. Amniotic fluid character: not assessed.  Cervix: Cervix evaluated by digital exam.     Fetal Exam Fetal Monitor Review: Mode: ultrasound.   Baseline rate: 140.  Variability: moderate (6-25 bpm).   Pattern: accelerations present and no decelerations.    Fetal State Assessment: Category I - tracings are normal.     Physical Exam  Constitutional: She is oriented to person, place, and  time. She appears well-developed and well-nourished. She appears distressed (with pain).  HENT:  Head: Normocephalic.  Cardiovascular: Normal rate and regular rhythm.   Respiratory: Effort normal and breath sounds normal. No respiratory distress.  GI: Soft. She exhibits no distension. There is no tenderness. There is no rebound and no guarding.  Genitourinary: Vaginal discharge found.  Genitourinary Comments: No pooling Mucous blood tinged discharge Dilation: 4 Effacement (%): 70 Cervical Position: Posterior Station: -2 Presentation: Vertex Exam by:: J.Thornton< RN    Musculoskeletal: Normal range of motion.  Neurological: She is alert and oriented to person, place, and time.  Skin: Skin is warm and dry.  Psychiatric: She has a normal mood and affect.    Prenatal labs: ABO, Rh: A/Positive/-- (03/07 1030) Antibody: Negative (03/07 1030) Rubella: 2.74 (03/07 1030) RPR: Non Reactive (06/27 0859)  HBsAg: Negative (03/07 1030)  HIV:    GBS:     Assessment/Plan: Single IUP at [redacted]w[redacted]d Active labor  Previous Cesarean Section Desires repeat C/S  Consulted Dr Macon Large Repeat C/S prep     Wynelle Bourgeois 03/30/2017, 4:14 PM

## 2017-03-31 ENCOUNTER — Encounter (HOSPITAL_COMMUNITY): Payer: Self-pay | Admitting: Obstetrics & Gynecology

## 2017-03-31 LAB — CBC
HCT: 27.9 % — ABNORMAL LOW (ref 36.0–46.0)
HCT: 29.8 % — ABNORMAL LOW (ref 36.0–46.0)
Hemoglobin: 9.5 g/dL — ABNORMAL LOW (ref 12.0–15.0)
Hemoglobin: 9.7 g/dL — ABNORMAL LOW (ref 12.0–15.0)
MCH: 27.3 pg (ref 26.0–34.0)
MCH: 26.2 pg (ref 26.0–34.0)
MCHC: 34.1 g/dL (ref 30.0–36.0)
MCHC: 32.6 g/dL (ref 30.0–36.0)
MCV: 80.2 fL (ref 78.0–100.0)
MCV: 80.5 fL (ref 78.0–100.0)
Platelets: 100 10*3/uL — ABNORMAL LOW (ref 150–400)
Platelets: 122 10*3/uL — ABNORMAL LOW (ref 150–400)
RBC: 3.48 MIL/uL — ABNORMAL LOW (ref 3.87–5.11)
RBC: 3.7 MIL/uL — ABNORMAL LOW (ref 3.87–5.11)
RDW: 15.1 % (ref 11.5–15.5)
RDW: 15.1 % (ref 11.5–15.5)
WBC: 6.7 10*3/uL (ref 4.0–10.5)
WBC: 7.6 10*3/uL (ref 4.0–10.5)

## 2017-03-31 LAB — RPR: RPR Ser Ql: NONREACTIVE

## 2017-03-31 MED ORDER — POLYETHYLENE GLYCOL 3350 17 G PO PACK
17.0000 g | PACK | Freq: Every day | ORAL | Status: DC
  Administered 2017-03-31 – 2017-04-01 (×2): 17 g via ORAL
  Filled 2017-03-31 (×3): qty 1

## 2017-03-31 MED ORDER — SENNOSIDES-DOCUSATE SODIUM 8.6-50 MG PO TABS: 2.0000 | ORAL_TABLET | Freq: Every evening | ORAL | Status: DC | PRN

## 2017-03-31 NOTE — Progress Notes (Addendum)
Post Partum Day 1 Subjective: Chloe Rodgers is a 26 y/o [phone] who delivered at [redacted]w[redacted]d (term labor) s/p RLTCS (#3) POD#1.   Pregnancy c/b h/o prior c-section, chronic n/v during her pregnancy  Overnight events: RN changed dressing x 2.  S:This morning, patient complains of moderate abdominal pain and dizziness when standing. Reports moderate vaginal bleeding stating "more than a period". Reports mild left sided breast tenderness, no discharge noted. Denies flatulence or bowel movements. Has foley catheter in place. Patient denies headache, blurry vision, chest pain, shortness of breath, or lower extremity pain  Objective: Blood pressure (!) 107/58, pulse 92, temperature 98.6 F (37 C), temperature source Oral, resp. rate 20, weight 112.4 kg (247 lb 12 oz), last menstrual period 07/04/2016, SpO2 100 %, unknown if currently breastfeeding.  Physical Exam:  General: alert, cooperative, appears stated age, fatigued, mild distress and mildly obese Lochia: appropriate Uterine Fundus: firm Abdomen: soft, mild diffuse abdominal tenderness without guarding or rebound, bowel sounds present x 4 quadrants Incision: reinforced dressing in place, did not remove at this time  DVT Evaluation: bilateral calf tenderness to palpation, bilateral SCD's in place,    Recent Labs  03/30/17 1610 03/31/17 0547  HGB 11.4* 9.5*  HCT 34.6* 27.9*  Plts: 100 at 0547 Blood type: A positive  RPR non-reactive  HIV non-reactive    Urine intake/output I.V. (mL/kg)  2300 (20.5)   Total Intake(mL/kg)  2300 (20.5)   Urine (mL/kg/hr)  575   Blood  877   Total Output  1452   Net  +848     Assessment/Plan: A 1. Post-cesarean section 2. Anemia   P Remove foley catheter and assess voiding, advance diet, assess pain control on oral narcotics, have nursing staff attempt to ambulate patient throughout the day, repeat CBC this afternoon, stool softeners as needed. IUD for contraception.   Per chart review,  patient at risk for MDD antepartum, has appointment with psychiatry at [facility], for [facility] med management postpartum. Consider SW consultation to discuss at home visit options per patient request.    LOS: 1 day   Dyke Maes Cimolino 03/31/2017, 9:15 AM    Agree with PA student note above except as noted below  Current Vital Signs 24h Vital Sign Ranges  T 99.3 F (37.4 C) Temp  Avg: 98.4 F (36.9 C)  Min: 97.7 F (36.5 C)  Max: 99.3 F (37.4 C)  BP (!) 110/58 BP  Min: 96/74  Max: 162/68  HR 92 Pulse  Avg: 90.4  Min: 80  Max: 108  RR 18 Resp  Avg: 19.4  Min: 16  Max: 22  SaO2 100 % Not Delivered SpO2  Avg: 100 %  Min: 100 %  Max: 100 %       24 Hour I/O Current Shift I/O  Time Ins Outs 09/19 0701 - 09/20 0700 In: 2300 [I.V.:2300] Out: 1452 [Urine:575] 09/20 0701 - 09/20 1900 In: -  Out: 250 [Urine:250]   Patient Vitals for the past 12 hrs:  BP Temp Temp src Pulse Resp SpO2  03/31/17 1010 (!) 110/58 99.3 F (37.4 C) Oral 92 18 100 %  03/31/17 0610 (!) 107/58 98.6 F (37 C) Oral 92 20 100 %  03/31/17 0248 (!) 110/57 - - 80 18 100 %  03/31/17 0015 (!) 103/51 98.7 F (37.1 C) Oral 83 20 100 %   NAD No MRGs, normal s1 and s2 CTAB Abd: rare BS, pressure dressing on. Minimal old blood on bottom part  of dressing near suprapubic area.  GU: no gross VB. Foley in place with normal UOP in tube. Bag with approx UOP that has some blood staining  A/P: pt stable Will repeat CBC to ensure stability. If stable, okay to d/c IVF and foley catheter. IUD. Formula.   Cornelia Copa MD Attending [facility] [employer] (Faculty Practice) 03/31/2017 Time: [phone]

## 2017-03-31 NOTE — Progress Notes (Signed)
MOB was referred for history of depression/anxiety. * Referral screened out by Clinical Social Worker because none of the following criteria appear to apply: ~ History of anxiety/depression during this pregnancy, or of post-partum depression. ~ Diagnosis of anxiety and/or depression within last 3 years OR * MOB's symptoms currently being treated with medication and/or therapy. Per CSW on 03/28/17, MOB has a counselor at [facility] and has been referred to a psychiatrist to being BH meds (see BH Specialist note for 03/28/2017).   Please contact the Clinical Social Worker if needs arise, by MOB request, or if MOB scores greater than 9/yes to question 10 on Edinburgh Postpartum Depression Screen.  There are no barriers to d/c.  Layton Naves Boyd-Gilyard, MSW, LCSW Clinical Social Work [phone] 
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
## 2017-03-31 NOTE — Anesthesia Postprocedure Evaluation (Signed)
Anesthesia Post Note  Patient: Chloe Rodgers  Procedure(s) Performed: Procedure(s) (LRB): REPEAT CESAREAN SECTION (N/A)     Patient location during evaluation: Mother Baby Anesthesia Type: Spinal Level of consciousness: awake and alert and oriented Pain management: pain level controlled Vital Signs Assessment: post-procedure vital signs reviewed and stable Respiratory status: spontaneous breathing and nonlabored ventilation Cardiovascular status: stable Postop Assessment: no headache, patient able to bend at knees, no backache, no apparent nausea or vomiting, spinal receding and adequate PO intake Anesthetic complications: no    Last Vitals:  Vitals:   03/31/17 0248 03/31/17 0610  BP: (!) 110/57 (!) 107/58  Pulse: 80 92  Resp: 18 20  Temp:  37 C  SpO2: 100% 100%    Last Pain:  Vitals:   03/31/17 0716  TempSrc:   PainSc: 8    Pain Goal:                 Laban Emperor

## 2017-03-31 NOTE — Progress Notes (Signed)
error 

## 2017-03-31 NOTE — Progress Notes (Signed)
RN in room for patient assessment.  Patient c/o pain level of 9 with burning and pressure at incision, oxyIR  given. Patient also c/o bad itching, nubain given as ordered. Previous c/o nausea and dizziness, resolved.   Patient eating well and was able to ambulate to bathroom for hygiene care, tolerated well with no ambulating complaints.  Foley still in place with output of , very dark/concentrated. Encouraged patient from start of shift at 0700 to increase fluid intake of water, RN still monitoring and encouraging increased fluid intake.

## 2017-03-31 NOTE — Addendum Note (Signed)
Addendum  created 03/31/17 0859 by Elgie Congo, CRNA   Sign clinical note

## 2017-04-01 ENCOUNTER — Encounter (HOSPITAL_COMMUNITY)
Admission: RE | Admit: 2017-04-01 | Discharge: 2017-04-01 | Disposition: A | Discharge status: Home / Self Care | Payer: Medicaid Other | Source: Ambulatory Visit | Attending: Family Medicine | Admitting: Family Medicine

## 2017-04-01 NOTE — Progress Notes (Signed)
Pt called for nurse to check dressing. RN found pressure dressing to be coming off and saturated honeycomb coming off underneath as well. RN obtained order to change dressing from resident [place]. RN performed sterile technique changing dressing and new honeycomb was applied, with assistance from [place], [place]. Dressing currently clean, dry, intact.

## 2017-04-02 LAB — CORD BLOOD GAS (ARTERIAL)

## 2017-04-02 LAB — CULTURE, BETA STREP (GROUP B ONLY): Strep Gp B Culture: NEGATIVE

## 2017-04-02 MED ORDER — OXYCODONE HCL 5 MG PO TABS: 5.0000 mg | ORAL_TABLET | ORAL | 0 refills | Status: DC | PRN

## 2017-04-02 MED ORDER — INFLUENZA VAC SPLIT QUAD 0.5 ML IM SUSY
0.5000 mL | PREFILLED_SYRINGE | INTRAMUSCULAR | Status: AC
  Administered 2017-04-02: 0.5 mL via INTRAMUSCULAR

## 2017-04-02 MED ORDER — IBUPROFEN 600 MG PO TABS: 600.0000 mg | ORAL_TABLET | Freq: Four times a day (QID) | ORAL | 0 refills | Status: DC | PRN

## 2017-04-02 NOTE — Discharge Summary (Signed)
OB Discharge Summary     Patient Name: Chloe Rodgers DOB: August 02, 1990 MRN: [id number]  Date of admission: 03/30/2017 Delivering MD: Duane Lope H   Date of discharge: 04/02/2017  Admitting diagnosis: 38wks CTX back pains REPEAT CS Intrauterine pregnancy: [redacted]w[redacted]d     Secondary diagnosis:  Principal Problem:   Status post repeat low transverse cesarean section  Additional problems: presenting in early labor; hx migranes; hx seizure d/o in prev pregnancy     Discharge diagnosis: Term Pregnancy Delivered                                                                                                Post partum procedures:none  Augmentation: N/A  Complications: None  Hospital course:  Onset of Labor With Planned C/S  26 y.o. yo [id number] at [redacted]w[redacted]d was admitted in Latent Labor on 03/30/2017. Due to her hx of prev C/S x 2 and desire for repeat, pt was taken to the OR for a C/S. Membrane Rupture Time/Date: 3:00 PM ,03/30/2017   The patient went for cesarean section due to Elective Repeat, and delivered a Viable infant,03/30/2017  Details of operation can be found in separate operative note. Patient had an uncomplicated postpartum course.  She is ambulating,tolerating a regular diet, passing flatus, and urinating well.  Patient is discharged home in stable condition 04/02/17.  Physical exam  Vitals:   03/31/17 1906 04/01/17 0600 04/01/17 1824 04/02/17 0530  BP: (!) 105/52 116/61 102/61 (!) 111/57  Pulse: 89 88 98 84  Resp: Temp: 98.4 F (36.9 C) 98.1 F (36.7 C) 97.8 F (36.6 C) 97.6 F (36.4 C)  TempSrc: Oral Oral Oral Oral  SpO2:   100%   Weight:       General: alert and cooperative Lochia: appropriate Uterine Fundus: firm Incision: honeycomb intact, unchanged DVT Evaluation: No evidence of DVT seen on physical exam. Labs: Lab Results  Component Value Date   WBC 7.6 03/31/2017   HGB 9.7 (L) 03/31/2017   HCT 29.8 (L) 03/31/2017   MCV 80.5 03/31/2017   PLT 122 (L) 03/31/2017   CMP Latest Ref Rng & Units 03/17/2017  Glucose 65 - 99 mg/dL 147(W)  BUN 6 - 20 mg/dL 7  Creatinine 2.95 - 6.21 mg/dL 3.08  Sodium 657 - 846 mmol/L 139  Potassium 3.5 - 5.2 mmol/L 3.8  Chloride 96 - 106 mmol/L 105  CO2 20 - 29 mmol/L 21  Calcium 8.7 - 10.2 mg/dL 8.7  Total Protein 6.0 - 8.5 g/dL 6.2  Total Bilirubin 0.0 - 1.2 mg/dL 0.3  Alkaline Phos 39 - 117 IU/L 115  AST 0 - 40 IU/L 22  ALT 0 - 32 IU/L 37(H)    Discharge instruction: per After Visit Summary and "Baby and Me Booklet".  After visit meds:  Allergies as of 04/02/2017   No Known Allergies     Medication List    STOP taking these medications   cyclobenzaprine 10 MG tablet Commonly known as:  FLEXERIL   diphenhydrAMINE 25 mg capsule Commonly known as:  BENADRYL   esomeprazole 20  MG capsule Commonly known as:  NEXIUM   prochlorperazine 10 MG tablet Commonly known as:  COMPAZINE     TAKE these medications   ibuprofen 600 MG tablet Commonly known as:  ADVIL,MOTRIN Take 1 tablet (600 mg total) by mouth every 6 (six) hours as needed.   oxyCODONE 5 MG immediate release tablet Commonly known as:  Oxy IR/ROXICODONE Take 1 tablet (5 mg total) by mouth every 4 (four) hours as needed (pain scale 4-7).            Discharge Care Instructions        Start     Ordered   04/02/17 0000  ibuprofen (ADVIL,MOTRIN) 600 MG tablet  Every 6 hours PRN    Question:  Supervising Provider  Answer:  Tilda Burrow   04/02/17 0732   04/02/17 0000  oxyCODONE (OXY IR/ROXICODONE) 5 MG immediate release tablet  Every 4 hours PRN    Question:  Supervising Provider  Answer:  Tilda Burrow   04/02/17 0732   04/02/17 0000  Discharge patient    Question Answer Comment  Discharge disposition 01-Home or Self Care   Discharge patient date 04/02/2017      04/02/17 0732      Diet: routine diet  Activity: Advance as tolerated. Pelvic rest for 6 weeks.   Outpatient follow up:4 weeks Follow up  Appt:No future appointments. Follow up Visit:No Follow-up on file.  Postpartum contraception: IUD Mirena  Newborn Data: Live born female  Birth Weight: 6 lb 13 oz (3090 g) APGAR: 8, 9  Baby Feeding: Bottle and Breast Disposition:home with mother   04/02/2017 Cam Hai, CNM 8:55 AM

## 2017-04-02 NOTE — Discharge Instructions (Signed)

## 2017-04-04 ENCOUNTER — Telehealth: Payer: Self-pay | Admitting: General Practice

## 2017-04-04 NOTE — Telephone Encounter (Signed)
Patient has Postpartum visit scheduled for 05/12/17 at 10:20am with Luna Kitchens, CNM.  Mailed patient a Banker.

## 2017-04-05 ENCOUNTER — Ambulatory Visit: Payer: Medicaid Other

## 2017-04-05 VITALS — BP 124/70 | HR 88 | Temp 98.6°F

## 2017-04-05 DIAGNOSIS — Z5189 Encounter for other specified aftercare: Secondary | ICD-10-CM

## 2017-04-05 NOTE — Progress Notes (Deleted)
Patient presented to the office for a wound check

## 2017-04-05 NOTE — Progress Notes (Signed)
Patient presented to the office for a wound check. Patient's wound was slightly opened on the right side. Wound was draining a minimal amount of blood. Patient states that she has been having chills x 2 days.Per Dr. Debroah Loop the wound looks good and patient should continue to keep the wound clean and dry. Patient verbalized understanding and had no questions.

## 2017-05-12 ENCOUNTER — Ambulatory Visit: Payer: Self-pay | Admitting: Student

## 2017-05-16 ENCOUNTER — Ambulatory Visit: Payer: Self-pay | Admitting: Advanced Practice Midwife

## 2017-07-27 ENCOUNTER — Encounter (HOSPITAL_COMMUNITY): Payer: Self-pay

## 2017-07-27 ENCOUNTER — Other Ambulatory Visit: Payer: Self-pay

## 2017-07-27 ENCOUNTER — Emergency Department (HOSPITAL_COMMUNITY)
Admission: EM | Admit: 2017-07-27 | Discharge: 2017-07-28 | Disposition: A | Discharge status: Home / Self Care | Payer: Medicaid Other | Attending: Emergency Medicine | Admitting: Emergency Medicine

## 2017-07-27 DIAGNOSIS — R5381 Other malaise: Secondary | ICD-10-CM | POA: Insufficient documentation

## 2017-07-27 DIAGNOSIS — N898 Other specified noninflammatory disorders of vagina: Secondary | ICD-10-CM | POA: Diagnosis not present

## 2017-07-27 DIAGNOSIS — N941 Unspecified dyspareunia: Secondary | ICD-10-CM | POA: Diagnosis not present

## 2017-07-27 DIAGNOSIS — R6883 Chills (without fever): Secondary | ICD-10-CM | POA: Diagnosis not present

## 2017-07-27 DIAGNOSIS — R51 Headache: Secondary | ICD-10-CM | POA: Diagnosis not present

## 2017-07-27 DIAGNOSIS — R5383 Other fatigue: Secondary | ICD-10-CM | POA: Diagnosis not present

## 2017-07-27 DIAGNOSIS — R1030 Lower abdominal pain, unspecified: Secondary | ICD-10-CM | POA: Insufficient documentation

## 2017-07-27 LAB — COMPREHENSIVE METABOLIC PANEL
ALT: 14 U/L (ref 14–54)
AST: 15 U/L (ref 15–41)
Albumin: 3.9 g/dL (ref 3.5–5.0)
Alkaline Phosphatase: 68 U/L (ref 38–126)
Anion gap: 8 (ref 5–15)
BUN: 15 mg/dL (ref 6–20)
CO2: 24 mmol/L (ref 22–32)
Calcium: 9.2 mg/dL (ref 8.9–10.3)
Chloride: 108 mmol/L (ref 101–111)
Creatinine, Ser: 0.94 mg/dL (ref 0.44–1.00)
GFR calc Af Amer: >60 mL/min (ref >60)
GFR calc non Af Amer: >60 mL/min (ref >60)
Glucose, Bld: 63 mg/dL — ABNORMAL LOW (ref 65–99)
Potassium: 4.1 mmol/L (ref 3.5–5.1)
Sodium: 140 mmol/L (ref 135–145)
Total Bilirubin: 0.4 mg/dL (ref 0.3–1.2)
Total Protein: 7.6 g/dL (ref 6.5–8.1)

## 2017-07-27 LAB — URINALYSIS, ROUTINE W REFLEX MICROSCOPIC
Bilirubin Urine: NEGATIVE
Glucose, UA: NEGATIVE mg/dL
Hgb urine dipstick: NEGATIVE
Ketones, ur: NEGATIVE mg/dL
Nitrite: NEGATIVE
Protein, ur: NEGATIVE mg/dL
Specific Gravity, Urine: 1.023 (ref 1.005–1.030)
pH: 6 (ref 5.0–8.0)

## 2017-07-27 LAB — CBC
HCT: 38.4 % (ref 36.0–46.0)
Hemoglobin: 12.4 g/dL (ref 12.0–15.0)
MCH: 26.6 pg (ref 26.0–34.0)
MCHC: 32.3 g/dL (ref 30.0–36.0)
MCV: 82.2 fL (ref 78.0–100.0)
Platelets: 200 10*3/uL (ref 150–400)
RBC: 4.67 MIL/uL (ref 3.87–5.11)
RDW: 14.4 % (ref 11.5–15.5)
WBC: 6.1 10*3/uL (ref 4.0–10.5)

## 2017-07-27 LAB — LIPASE, BLOOD: Lipase: 22 U/L (ref 11–51)

## 2017-07-27 LAB — WET PREP, GENITAL
Sperm: NONE SEEN
Trich, Wet Prep: NONE SEEN
Yeast Wet Prep HPF POC: NONE SEEN

## 2017-07-27 LAB — CBG MONITORING, ED: Glucose-Capillary: 71 mg/dL (ref 65–99)

## 2017-07-27 LAB — PREGNANCY, URINE: Preg Test, Ur: NEGATIVE

## 2017-07-27 MED ORDER — CEFTRIAXONE SODIUM 250 MG IJ SOLR
250.0000 mg | Freq: Once | INTRAMUSCULAR | Status: AC
  Administered 2017-07-28: 250 mg via INTRAMUSCULAR
  Filled 2017-07-27: qty 250

## 2017-07-27 MED ORDER — KETOROLAC TROMETHAMINE 60 MG/2ML IM SOLN
60.0000 mg | Freq: Once | INTRAMUSCULAR | Status: AC
  Administered 2017-07-27: 60 mg via INTRAMUSCULAR
  Filled 2017-07-27: qty 2

## 2017-07-27 MED ORDER — METOCLOPRAMIDE HCL 5 MG/ML IJ SOLN
10.0000 mg | Freq: Once | INTRAMUSCULAR | Status: AC
  Administered 2017-07-27: 10 mg via INTRAMUSCULAR
  Filled 2017-07-27: qty 2

## 2017-07-27 NOTE — ED Notes (Signed)
Request to add urine pregnancy on to UA sent to lab

## 2017-07-27 NOTE — ED Provider Notes (Signed)
[facility] EMERGENCY DEPARTMENT Provider Note   CSN: [id number] Arrival date & time: 07/27/17  1648     History   Chief Complaint Chief Complaint  Patient presents with  . Abdominal Pain    HPI Chloe Rodgers is a 27 y.o. female.   27 year old female with a history of pelvic inflammatory disease and anxiety presents to the emergency department for multiple complaints.  She reports onset of generalized fatigue 3 days ago.  This has been associated with malaise and chills.  She also reports abdominal pain which has been present in her lower abdomen.  This is intermittent.  She has not taken any medications prior to arrival for symptoms.  She has noted some associated pain with intercourse as well as vaginal discharge.  She believes her symptoms are similar to past episodes of PID.  She has not had fevers, vomiting, dysuria, hematuria, bowel changes.  LMP was first week in December.  She has had 2 sexual partners in the past 6 months.  No hx of protection when sexually active.  She is c/o headache at present and has a hx of migraines.       Past Medical History:  Diagnosis Date  . Anemia   . Anxiety   . Chlamydia   . Complication of anesthesia    ??, seizure with wisdom teeth  . Depression   . Migraine   . PID (acute pelvic inflammatory disease) 2014  . PONV (postoperative nausea and vomiting)   . Psoriasis   . Seizures [facility])    July 2013 - [place]    Patient Active Problem List   Diagnosis Date Noted  . Status post repeat low transverse cesarean section 03/30/2017  . Depression affecting pregnancy in second trimester, antepartum 01/05/2017  . Supervision of normal pregnancy 10/06/2016  . Chlamydia infection affecting pregnancy in first trimester 08/23/2016  . Migraines 07/24/2012  . Previous cesarean delivery, antepartum condition or complication 03/30/2012  . Obese 03/30/2012  . Seizure disorder (HCC) 02/03/2012  . Family history of breast  cancer in mother 02/03/2012    Past Surgical History:  Procedure Laterality Date  . CESAREAN SECTION    . CESAREAN SECTION  06/06/2012   Procedure: CESAREAN SECTION;  Surgeon: Adam Phenix, MD;  Location: [facility] ORS;  Service: Obstetrics;  Laterality: N/A;  . CESAREAN SECTION N/A 03/30/2017   Procedure: REPEAT CESAREAN SECTION;  Surgeon: Lazaro Arms, MD;  Location: [facility] BIRTHING SUITES;  Service: Obstetrics;  Laterality: N/A;  . SKIN GRAFT     off abd, onto arm  . WISDOM TOOTH EXTRACTION      OB History    Gravida Para Term Preterm AB Living   4 3 3  0 1 3   SAB TAB Ectopic Multiple Live Births   0 1 0 0 3       Home Medications    Prior to Admission medications   Medication Sig Start Date End Date Taking? Authorizing Provider  doxycycline (VIBRAMYCIN) 100 MG capsule Take 1 capsule (100 mg total) by mouth 2 (two) times daily. 07/27/17   Antony Madura, PA-C  naproxen (NAPROSYN) 500 MG tablet Take 1 tablet (500 mg total) by mouth 2 (two) times daily. 07/27/17   Antony Madura, PA-C  promethazine (PHENERGAN) 25 MG tablet Take 1 tablet (25 mg total) by mouth every 6 (six) hours as needed for nausea or vomiting. 07/27/17   Antony Madura, PA-C    Family History Family History  Problem Relation Age of  Onset  . Hypertension Mother   . Diabetes Mother   . Cancer Mother        BREAST  . Stroke Mother   . Seizures Mother   . Asthma Daughter   . Hypertension Maternal Grandmother   . Diabetes Maternal Grandmother   . Cancer Maternal Grandmother        BONE CANCER  . Other Neg Hx     Social History Social History   Tobacco Use  . Smoking status: Never Smoker  . Smokeless tobacco: Never Used  Substance Use Topics  . Alcohol use: No    Comment: socially but none with pregnancy  . Drug use: No    Comment: Last use in 07/2011     Allergies   Patient has no known allergies.   Review of Systems Review of Systems Ten systems reviewed and are negative for acute change, except as  noted in the HPI.    Physical Exam Updated Vital Signs BP 118/87   Pulse 77   Temp 98.8 F (37.1 C) (Oral)   Resp 19   Ht 5\' 6"  (1.676 m)   Wt 104.3 kg (230 lb)   SpO2 100%   BMI 37.12 kg/m   Physical Exam  Constitutional: She is oriented to person, place, and time. She appears well-developed and well-nourished. No distress.  Nontoxic appearing and in NAD  HENT:  Head: Normocephalic and atraumatic.  Eyes: Conjunctivae and EOM are normal. No scleral icterus.  Neck: Normal range of motion.  Cardiovascular: Normal rate, regular rhythm and intact distal pulses.  Pulmonary/Chest: Effort normal. No stridor. No respiratory distress.  Respirations even and unlabored  Abdominal: Soft.  Obese, soft abdomen. Minimal TTP in the suprapubic abdomen. No masses, distension, peritoneal signs.  Genitourinary: There is no rash, tenderness, lesion or injury on the right labia. There is no rash, tenderness, lesion or injury on the left labia. Uterus is tender. Cervix exhibits no friability. Right adnexum displays no mass. Left adnexum displays no mass. No bleeding in the vagina. No foreign body in the vagina. Vaginal discharge found.  Genitourinary Comments: Copious, thick, white discharge in vaginal vault.  Musculoskeletal: Normal range of motion.  Neurological: She is alert and oriented to person, place, and time. She exhibits normal muscle tone. Coordination normal.  GCS 15. Patient moving all extremities.  Skin: Skin is warm and dry. No rash noted. She is not diaphoretic. No erythema. No pallor.  Psychiatric: She has a normal mood and affect. Her behavior is normal.  Nursing note and vitals reviewed.    ED Treatments / Results  Labs (all labs ordered are listed, but only abnormal results are displayed) Labs Reviewed  WET PREP, GENITAL - Abnormal; Notable for the following components:      Result Value   Clue Cells Wet Prep HPF POC PRESENT (*)    WBC, Wet Prep HPF POC MANY (*)    All  other components within normal limits  COMPREHENSIVE METABOLIC PANEL - Abnormal; Notable for the following components:   Glucose, Bld 63 (*)    All other components within normal limits  URINALYSIS, ROUTINE W REFLEX MICROSCOPIC - Abnormal; Notable for the following components:   APPearance CLOUDY (*)    Leukocytes, UA LARGE (*)    Bacteria, UA RARE (*)    Squamous Epithelial / LPF 6-30 (*)    All other components within normal limits  LIPASE, BLOOD  CBC  PREGNANCY, URINE  CBG MONITORING, ED  GC/CHLAMYDIA PROBE AMP ([facility]) NOT AT [facility]    EKG  EKG Interpretation None       Radiology No results found.  Procedures Procedures (including critical care time)  Medications Ordered in ED Medications  cefTRIAXone (ROCEPHIN) injection 250 mg (not administered)  ketorolac (TORADOL) injection 60 mg (60 mg Intramuscular Given 07/27/17 2309)  metoCLOPramide (REGLAN) injection 10 mg (10 mg Intramuscular Given 07/27/17 2309)     Initial Impression / Assessment and Plan / ED Course  I have reviewed the triage vital signs and the nursing notes.  Pertinent labs & imaging results that were available during my care of the patient were reviewed by me and considered in my medical decision making (see chart for details).     27 year old female presents to the emergency department for evaluation of fatigue and malaise with lower abdominal pain and dyspareunia.  She notes vaginal discharge as well.  She has a history of unprotected sexual intercourse.  Patient with soft abdomen. There is lower abdominal TTP, but no peritoneal signs. Mild TTP on palpation of the uterus on bimanual exam. Copious discharge in the vaginal vault.  Laboratory work up is reassuring.  No leukocytosis, electrolyte abnormalities.  Liver and kidney function preserved.  Pregnancy is negative and urinalysis consistent with contamination.  No current concern for UTI.  Patient has been afebrile since arrival with  reassuring vitals signs.  Given increased white blood cells on wet prep with history of unprotected sexual intercourse in the setting of lower abdominal pain, will treat for PID.  Gonorrhea and Chlamydia cultures pending.  I have advised follow-up with [facility] outpatient clinic with return for any new or concerning symptoms.  Return precautions discussed and provided. Patient discharged in stable condition with no unaddressed concerns.   Final Clinical Impressions(s) / ED Diagnoses   Final diagnoses:  Lower abdominal pain    ED Discharge Orders        Ordered    promethazine (PHENERGAN) 25 MG tablet  Every 6 hours PRN     07/28/17 0000    doxycycline (VIBRAMYCIN) 100 MG capsule  2 times daily     07/28/17 0000    naproxen (NAPROSYN) 500 MG tablet  2 times daily     07/28/17 0000       Antony Madura, PA-C 07/28/17 0016    Linwood Dibbles, MD 07/29/17 1143

## 2017-07-27 NOTE — ED Triage Notes (Addendum)
Pt presents to the ed with complaints of headache, abdominal pain, generalized weakness and chills x 3 days. Also complains of vaginal pain with sexual intercourse.

## 2017-07-28 LAB — GC/CHLAMYDIA PROBE AMP (~~LOC~~) NOT AT ARMC
Chlamydia: POSITIVE — AB
Neisseria Gonorrhea: NEGATIVE

## 2017-07-28 MED ORDER — LIDOCAINE HCL (PF) 1 % IJ SOLN
INTRAMUSCULAR | Status: AC
  Filled 2017-07-28: qty 5

## 2017-07-28 MED ORDER — NAPROXEN 500 MG PO TABS: 500.0000 mg | ORAL_TABLET | Freq: Two times a day (BID) | ORAL | 0 refills | Status: DC

## 2017-07-28 MED ORDER — PROMETHAZINE HCL 25 MG PO TABS: 25.0000 mg | ORAL_TABLET | Freq: Four times a day (QID) | ORAL | 0 refills | Status: DC | PRN

## 2017-07-28 MED ORDER — DOXYCYCLINE HYCLATE 100 MG PO CAPS: 100.0000 mg | ORAL_CAPSULE | Freq: Two times a day (BID) | ORAL | 0 refills | Status: DC

## 2017-07-28 NOTE — Discharge Instructions (Signed)
Take doxycycline as prescribe until finished. We recommend Naproxen for pain and Phenergan for nausea. Follow up with an OBGYN for persistent symptoms. Use protection such as condoms when sexually active.

## 2017-08-11 ENCOUNTER — Emergency Department (HOSPITAL_COMMUNITY)
Admission: EM | Admit: 2017-08-11 | Discharge: 2017-08-11 | Disposition: A | Discharge status: Home / Self Care | Payer: Medicaid Other | Attending: Emergency Medicine | Admitting: Emergency Medicine

## 2017-08-11 ENCOUNTER — Other Ambulatory Visit: Payer: Self-pay

## 2017-08-11 ENCOUNTER — Encounter (HOSPITAL_COMMUNITY): Payer: Self-pay | Admitting: Emergency Medicine

## 2017-08-11 DIAGNOSIS — M79602 Pain in left arm: Secondary | ICD-10-CM | POA: Diagnosis present

## 2017-08-11 DIAGNOSIS — R202 Paresthesia of skin: Secondary | ICD-10-CM | POA: Insufficient documentation

## 2017-08-11 DIAGNOSIS — R200 Anesthesia of skin: Secondary | ICD-10-CM | POA: Diagnosis not present

## 2017-08-11 MED ORDER — DOXYCYCLINE HYCLATE 100 MG PO CAPS: 100.0000 mg | ORAL_CAPSULE | Freq: Two times a day (BID) | ORAL | 0 refills | Status: DC

## 2017-08-11 MED ORDER — METHOCARBAMOL 500 MG PO TABS
500.0000 mg | ORAL_TABLET | Freq: Once | ORAL | Status: AC
  Administered 2017-08-11: 500 mg via ORAL
  Filled 2017-08-11: qty 1

## 2017-08-11 MED ORDER — NAPROXEN 500 MG PO TABS: 500.0000 mg | ORAL_TABLET | Freq: Two times a day (BID) | ORAL | 0 refills | Status: DC

## 2017-08-11 MED ORDER — KETOROLAC TROMETHAMINE 30 MG/ML IJ SOLN
30.0000 mg | Freq: Once | INTRAMUSCULAR | Status: AC
  Administered 2017-08-11: 30 mg via INTRAMUSCULAR
  Filled 2017-08-11: qty 1

## 2017-08-11 MED ORDER — METHOCARBAMOL 500 MG PO TABS: 500.0000 mg | ORAL_TABLET | Freq: Every evening | ORAL | 0 refills | Status: DC | PRN

## 2017-08-11 NOTE — ED Notes (Signed)
Pt verbalized understanding of discharge instructions and denies any further questions at this time.   
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
## 2017-08-11 NOTE — Discharge Instructions (Signed)
Please follow up with orthopedics for management of your pain Take Naproxen for pain twice daily Take Robaxin as needed for muscle pain - this medication makes you sleepy Take Doxycycline for PID twice daily for the next 2 weeks Follow up with orthopedics - a referral to two practices have been provided
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
## 2017-08-23 ENCOUNTER — Other Ambulatory Visit: Payer: Self-pay | Admitting: General Practice

## 2017-08-23 ENCOUNTER — Telehealth: Payer: Self-pay | Admitting: General Practice

## 2017-08-23 DIAGNOSIS — Z3491 Encounter for supervision of normal pregnancy, unspecified, first trimester: Secondary | ICD-10-CM

## 2017-08-23 NOTE — Telephone Encounter (Signed)
Received phone call from patient's primary care provider that the patient has an appt with us next week on the 20th but the patient wants a sooner appt because she is now pregnant. They state the patient really isn't sure how far along she is because her periods have been irregular. Told them I would contact the patient regarding an appt.  Called patient stating I understand she has an appt next week with us but is now pregnant. Asked patient about her last LMP. Patient states it was 07/11/17 but she has been bleeding off and on since then. Patient states she is currently spotting but is having severe pain from cramps. Patient states she thought maybe she had a UTI but isn't sure. Recommended she go to [facility] today for follow up given pain and irregular bleeding in the presence of pregnancy. Patient verbalized understanding and had no questions

## 2017-08-24 ENCOUNTER — Encounter (HOSPITAL_COMMUNITY): Payer: Self-pay | Admitting: *Deleted

## 2017-08-24 ENCOUNTER — Other Ambulatory Visit: Payer: Self-pay

## 2017-08-24 ENCOUNTER — Inpatient Hospital Stay (HOSPITAL_COMMUNITY): Payer: Medicaid Other

## 2017-08-24 ENCOUNTER — Inpatient Hospital Stay (HOSPITAL_COMMUNITY)
Admission: AD | Admit: 2017-08-24 | Discharge: 2017-08-24 | Discharge status: Against Medical Advice | Payer: Medicaid Other | Source: Ambulatory Visit | Attending: Obstetrics and Gynecology | Admitting: Obstetrics and Gynecology

## 2017-08-24 DIAGNOSIS — Z5321 Procedure and treatment not carried out due to patient leaving prior to being seen by health care provider: Secondary | ICD-10-CM | POA: Insufficient documentation

## 2017-08-24 DIAGNOSIS — O26891 Other specified pregnancy related conditions, first trimester: Secondary | ICD-10-CM | POA: Diagnosis present

## 2017-08-24 DIAGNOSIS — R109 Unspecified abdominal pain: Secondary | ICD-10-CM

## 2017-08-24 DIAGNOSIS — O26899 Other specified pregnancy related conditions, unspecified trimester: Secondary | ICD-10-CM

## 2017-08-24 LAB — URINALYSIS, ROUTINE W REFLEX MICROSCOPIC
Bilirubin Urine: NEGATIVE
Glucose, UA: NEGATIVE mg/dL
Hgb urine dipstick: NEGATIVE
Ketones, ur: NEGATIVE mg/dL
Nitrite: NEGATIVE
Protein, ur: NEGATIVE mg/dL
Specific Gravity, Urine: 1.024 (ref 1.005–1.030)
pH: 6 (ref 5.0–8.0)

## 2017-08-24 LAB — CBC
HCT: 35.9 % — ABNORMAL LOW (ref 36.0–46.0)
Hemoglobin: 12.1 g/dL (ref 12.0–15.0)
MCH: 27.3 pg (ref 26.0–34.0)
MCHC: 33.7 g/dL (ref 30.0–36.0)
MCV: 80.9 fL (ref 78.0–100.0)
Platelets: 161 10*3/uL (ref 150–400)
RBC: 4.44 MIL/uL (ref 3.87–5.11)
RDW: 14.1 % (ref 11.5–15.5)
WBC: 3.7 10*3/uL — ABNORMAL LOW (ref 4.0–10.5)

## 2017-08-24 LAB — ABO/RH: ABO/RH(D): A POS

## 2017-08-24 LAB — POCT PREGNANCY, URINE: Preg Test, Ur: POSITIVE — AB

## 2017-08-24 LAB — HCG, QUANTITATIVE, PREGNANCY: hCG, Beta Chain, Quant, S: 27646 m[IU]/mL — ABNORMAL HIGH (ref <5)

## 2017-08-24 NOTE — MAU Note (Signed)
Pt not in lobby x3 

## 2017-08-24 NOTE — MAU Note (Signed)
Having like a lit of sicknes (just feels nauseated, has not thrown up) and abd pain.  +HPT and +test at primary doctor yesterday.

## 2017-08-24 NOTE — MAU Note (Signed)
Called pt name to go to u/s. Pt not in lobby

## 2017-08-24 NOTE — MAU Note (Signed)
Called pt from lobby to see if lab had drawn her blood and to let her know that we have put in for her to go to u/s and that she would likely go there from the lobby.  Pt was not in lobby to inform her of this.

## 2017-08-25 ENCOUNTER — Inpatient Hospital Stay (HOSPITAL_COMMUNITY): Payer: Medicaid Other

## 2017-08-25 ENCOUNTER — Inpatient Hospital Stay (HOSPITAL_COMMUNITY)
Admission: AD | Admit: 2017-08-25 | Discharge: 2017-08-25 | Disposition: A | Discharge status: Home / Self Care | Payer: Medicaid Other | Source: Ambulatory Visit | Attending: Obstetrics and Gynecology | Admitting: Obstetrics and Gynecology

## 2017-08-25 ENCOUNTER — Other Ambulatory Visit: Payer: Self-pay

## 2017-08-25 ENCOUNTER — Encounter (HOSPITAL_COMMUNITY): Payer: Self-pay | Admitting: *Deleted

## 2017-08-25 DIAGNOSIS — Z3A01 Less than 8 weeks gestation of pregnancy: Secondary | ICD-10-CM

## 2017-08-25 DIAGNOSIS — Z79899 Other long term (current) drug therapy: Secondary | ICD-10-CM | POA: Insufficient documentation

## 2017-08-25 DIAGNOSIS — N760 Acute vaginitis: Secondary | ICD-10-CM | POA: Diagnosis not present

## 2017-08-25 DIAGNOSIS — O23591 Infection of other part of genital tract in pregnancy, first trimester: Secondary | ICD-10-CM | POA: Diagnosis not present

## 2017-08-25 DIAGNOSIS — B9689 Other specified bacterial agents as the cause of diseases classified elsewhere: Secondary | ICD-10-CM | POA: Diagnosis not present

## 2017-08-25 DIAGNOSIS — O99341 Other mental disorders complicating pregnancy, first trimester: Secondary | ICD-10-CM | POA: Insufficient documentation

## 2017-08-25 DIAGNOSIS — L409 Psoriasis, unspecified: Secondary | ICD-10-CM | POA: Insufficient documentation

## 2017-08-25 DIAGNOSIS — O26891 Other specified pregnancy related conditions, first trimester: Secondary | ICD-10-CM | POA: Diagnosis not present

## 2017-08-25 DIAGNOSIS — F419 Anxiety disorder, unspecified: Secondary | ICD-10-CM | POA: Diagnosis not present

## 2017-08-25 DIAGNOSIS — Z87442 Personal history of urinary calculi: Secondary | ICD-10-CM | POA: Insufficient documentation

## 2017-08-25 DIAGNOSIS — M549 Dorsalgia, unspecified: Secondary | ICD-10-CM | POA: Diagnosis present

## 2017-08-25 DIAGNOSIS — F329 Major depressive disorder, single episode, unspecified: Secondary | ICD-10-CM | POA: Insufficient documentation

## 2017-08-25 DIAGNOSIS — R109 Unspecified abdominal pain: Secondary | ICD-10-CM | POA: Diagnosis present

## 2017-08-25 DIAGNOSIS — O209 Hemorrhage in early pregnancy, unspecified: Secondary | ICD-10-CM | POA: Diagnosis not present

## 2017-08-25 DIAGNOSIS — O99711 Diseases of the skin and subcutaneous tissue complicating pregnancy, first trimester: Secondary | ICD-10-CM | POA: Diagnosis not present

## 2017-08-25 DIAGNOSIS — Z3491 Encounter for supervision of normal pregnancy, unspecified, first trimester: Secondary | ICD-10-CM

## 2017-08-25 HISTORY — DX: Calculus of kidney: N20.0

## 2017-08-25 HISTORY — DX: Unspecified infectious disease: B99.9

## 2017-08-25 LAB — WET PREP, GENITAL
Sperm: NONE SEEN
Trich, Wet Prep: NONE SEEN
Yeast Wet Prep HPF POC: NONE SEEN

## 2017-08-25 MED ORDER — PROMETHAZINE HCL 25 MG PO TABS: 25.0000 mg | ORAL_TABLET | Freq: Four times a day (QID) | ORAL | 0 refills | Status: DC | PRN

## 2017-08-25 MED ORDER — PROMETHAZINE HCL 25 MG/ML IJ SOLN
25.0000 mg | Freq: Once | INTRAVENOUS | Status: AC
  Administered 2017-08-25: 25 mg via INTRAVENOUS
  Filled 2017-08-25: qty 1

## 2017-08-25 MED ORDER — METRONIDAZOLE 500 MG PO TABS: 500.0000 mg | ORAL_TABLET | Freq: Two times a day (BID) | ORAL | 0 refills | Status: DC

## 2017-08-25 NOTE — MAU Provider Note (Signed)
History  CSN: [id number] Arrival date and time: 08/25/17 [id number]  First Provider Initiated Contact with Patient 08/25/17 [phone]      Chief Complaint  Patient presents with  . Abdominal Pain  . Back Pain    HPI: Chloe Rodgers is a 27 y.o. [phone] with IUP at 717w3d who presents to maternity admissions reporting abdominal pain, nausea, and diarrhea for about 3 days.  The patient presented to MAU yesterday but left after obtaining UA, urine pregnancy test, CBC, and bHCG quant because she needed to pick up her daughter.  Labs were significant for a positive urine pregnancy test, elevated HCG, and UA suggestive of some mild dehydration.    She returns today for worsening abdominal pain and nausea.  Patient reports she has not been able to keep anything down since yesterday, including both food and liquids.  She denies vaginal pain, but reports "brown" vaginal discharge that has an odor.  Patients LMP was around Dec 31st.  She reports irregular menstruation since delivery of her son in September.    Denies contractions, leakage of fluid or significant vaginal bleeding. Also denies fevers, chills, malaise, dysuria, hematuria, urinary frequency, RUQ/epigastric pain, dizziness/lighreadhess, or headache.   She has not received PNC at this time.    OB History  Gravida Para Term Preterm AB Living  5 3 3  0 1 3  SAB TAB Ectopic Multiple Live Births  0 1 0 0 3    # Outcome Date GA Lbr Len/2nd Weight Sex Delivery Anes PTL Lv  5 Current           4 Term 03/30/17 7075w3d  3.09 kg (6 lb 13 oz) M CS-Vac Spinal  LIV  3 TAB 2015             Birth Comments: System Generated. Please review and update pregnancy details.  2 Term 06/06/12 559w0d  3.625 kg (7 lb 15.9 oz) F CS-LTranv Spinal  LIV  1 Term 02/25/11 4241w0d  4.026 kg (8 lb 14 oz) F CS-LTranv EPI N LIV     Birth Comments: c/s  due to "BP dropped"     Past Medical History:  Diagnosis Date  . Anemia   . Anxiety   . Chlamydia   . Complication of  anesthesia    ??, seizure with wisdom teeth  . Depression    not on meds, sees a therapist  . Infection    UTI  . Kidney stone   . Migraine   . PID (acute pelvic inflammatory disease) 2014  . PONV (postoperative nausea and vomiting)   . Psoriasis   . Seizures [facility](HCC)    [place]   Past Surgical History:  Procedure Laterality Date  . CESAREAN SECTION    . CESAREAN SECTION  06/06/2012   Procedure: CESAREAN SECTION;  Surgeon: Adam PhenixJames G Arnold, MD;  Location: [facility];  Service: Obstetrics;  Laterality: N/A;  . CESAREAN SECTION N/A 03/30/2017   Procedure: REPEAT CESAREAN SECTION;  Surgeon: Lazaro ArmsEure, Luther H, MD;  [facility];  Service: Obstetrics;  Laterality: N/A;  . SKIN GRAFT     off abd, onto arm  . WISDOM TOOTH EXTRACTION     Family History  Problem Relation Age of Onset  . Hypertension Mother   . Diabetes Mother   . Cancer Mother        BREAST  . Stroke Mother   . Seizures Mother   . Asthma Daughter   . Hypertension Maternal Grandmother   .  Diabetes Maternal Grandmother   . Cancer Maternal Grandmother        BONE CANCER  . Other Neg Hx    Social History   Socioeconomic History  . Marital status: Single    Spouse name: Not on file  . Number of children: Not on file  . Years of education: Not on file  . Highest education level: Not on file  Social Needs  . Financial resource strain: Not on file  . Food insecurity - worry: Not on file  . Food insecurity - inability: Not on file  . Transportation needs - medical: Not on file  . Transportation needs - non-medical: Not on file  Occupational History  . Not on file  Tobacco Use  . Smoking status: Never Smoker  . Smokeless tobacco: Never Used  Substance and Sexual Activity  . Alcohol use: No    Comment: socially but none with pregnancy  . Drug use: No  . Sexual activity: Yes    Birth control/protection: None  Other Topics Concern  . Not on file  Social History Narrative  . Not on  file   No Known Allergies  Medications Prior to Admission  Medication Sig Dispense Refill Last Dose  . doxycycline (VIBRAMYCIN) 100 MG capsule Take 1 capsule (100 mg total) by mouth 2 (two) times daily. 28 capsule 0   . methocarbamol (ROBAXIN) 500 MG tablet Take 1 tablet (500 mg total) by mouth at bedtime and may repeat dose one time if needed. 15 tablet 0   . naproxen (NAPROSYN) 500 MG tablet Take 1 tablet (500 mg total) by mouth 2 (two) times daily. 30 tablet 0   . promethazine (PHENERGAN) 25 MG tablet Take 1 tablet (25 mg total) by mouth every 6 (six) hours as needed for nausea or vomiting. 12 tablet 0     I have reviewed patient's Past Medical Hx, Surgical Hx, Family Hx, Social Hx, medications and allergies.   Review of Systems: Negative except for what is mentioned in HPI.  Physical Exam   Blood pressure 135/73, pulse 90, temperature 98.1 F (36.7 C), temperature source Oral, resp. rate 18, weight 104 kg (229 lb 4 oz), last menstrual period 07/11/2017, unknown if currently breastfeeding.  Constitutional: Well-developed, well-nourished female in no acute distress.  HENT: [place]/AT, normal oropharynx mucosa. MMM Eyes: normal conjunctivae, no scleral icterus Cardiovascular: normal rate, regular rhythm Respiratory: normal effort, lungs CTAB.  GI: Abd soft, mildly tender especially in the LLQ, decreased bowel sounds    GU: Neg CVAT. Pelvic: NEFG. Normal vaginal mucosa without lesions. Cervix pink, visually closed, without lesion, scant briwn discharge from cervical os, significant milky white discharge in vagina  SVE: closed cervix, no CMT, no adnexal masses appreciated MSK: Extremities nontender, no edema Neurologic: Alert and oriented x 4. Psych: Normal mood and affect Skin: warm and dry   MAU Course/MDM:   Nursing notes and VS reviewed. Patient seen and examined, as noted above Started IV LR with phenergan.  Discharged with precautions   Results reviewed:  Results for  orders placed or performed during the hospital encounter of 08/25/17  Wet prep, genital  Result Value Ref Range   Yeast Wet Prep HPF POC NONE SEEN NONE SEEN   Trich, Wet Prep NONE SEEN NONE SEEN   Clue Cells Wet Prep HPF POC PRESENT (A) NONE SEEN   WBC, Wet Prep HPF POC MANY (A) NONE SEEN   Sperm NONE SEEN     US OB Transvaginal CLINICAL DATA:  Pain for 3 weeks  EXAM: OBSTETRIC <14 WK [place] AND TRANSVAGINAL OB US  TECHNIQUE: Both transabdominal and transvaginal ultrasound examinations were performed for complete evaluation of the gestation as well as the maternal uterus, adnexal regions, and pelvic cul-de-sac. Transvaginal technique was performed to assess early pregnancy.  COMPARISON:  None.  FINDINGS: Intrauterine gestational sac: Single  Yolk sac:  Visualized  Embryo:  Visualized  Cardiac Activity: Visualized  Heart Rate: 103 bpm  MSD:   mm    w     d  CRL:  5.5 mm   6 w   2 d                  [place] EDC: 04/18/2018  Subchorionic hemorrhage:  Small subchorionic hemorrhage  Maternal uterus/adnexae: No adnexal mass. Trace free fluid. Right corpus luteum cyst.  IMPRESSION: 6 week 2 day intrauterine pregnancy. Fetal heart rate 103 beats per minute. Small subchorionic hemorrhage.  Electronically Signed   By: Charlett Nose M.D.   On: 08/25/2017 10:39 US OB Comp Less 14 Wks CLINICAL DATA:  Pain for 3 weeks  EXAM: OBSTETRIC <14 WK [place] AND TRANSVAGINAL OB US  TECHNIQUE: Both transabdominal and transvaginal ultrasound examinations were performed for complete evaluation of the gestation as well as the maternal uterus, adnexal regions, and pelvic cul-de-sac. Transvaginal technique was performed to assess early pregnancy.  COMPARISON:  None.  FINDINGS: Intrauterine gestational sac: Single  Yolk sac:  Visualized  Embryo:  Visualized  Cardiac Activity: Visualized  Heart Rate: 103 bpm  MSD:   mm    w     d  CRL:  5.5 mm   6 w   2 d                  [place] EDC:  04/18/2018  Subchorionic hemorrhage:  Small subchorionic hemorrhage  Maternal uterus/adnexae: No adnexal mass. Trace free fluid. Right corpus luteum cyst.  IMPRESSION: 6 week 2 day intrauterine pregnancy. Fetal heart rate 103 beats per minute. Small subchorionic hemorrhage.  Electronically Signed   By: Charlett Nose M.D.   On: 08/25/2017 10:39     Assessment and Plan  Assessment: 1. Normal intrauterine pregnancy on prenatal ultrasound in first trimester   2. Bleeding in early pregnancy   3. [redacted] weeks gestation of pregnancy   4. Bacterial vaginosis     Plan: -- PO Flagyl 500mg  BID for 7 days for bacterial vaginosis  -- RX for phenergan sent to patient's pharmacy -- Discharge home in stable condition.  -- Abdominal pain and vaginal bleeding precautions reviewed with patient -- Encouraged patient to start prenatal care with OB/GYN provider of choice -- Patient may return to MAU for emergencies as needed    Ames Coupe, Medical Student 08/25/2017 9:54 AM  I confirm that I have verified the information documented in the medical student's note and that I have also personally reperformed the physical exam and all medical decision making activities.  Rolm Bookbinder, CNM 08/25/17 11:17 AM

## 2017-08-25 NOTE — Discharge Instructions (Signed)
Bacterial Vaginosis Bacterial vaginosis is a vaginal infection that occurs when the normal balance of bacteria in the vagina is disrupted. It results from an overgrowth of certain bacteria. This is the most common vaginal infection among women ages 7-44. Because bacterial vaginosis increases your risk for STIs (sexually transmitted infections), getting treated can help reduce your risk for chlamydia, gonorrhea, herpes, and HIV (human immunodeficiency virus). Treatment is also important for preventing complications in pregnant women, because this condition can cause an early (premature) delivery. What are the causes? This condition is caused by an increase in harmful bacteria that are normally present in small amounts in the vagina. However, the reason that the condition develops is not fully understood. What increases the risk? The following factors may make you more likely to develop this condition:  Having a new sexual partner or multiple sexual partners.  Having unprotected sex.  Douching.  Having an intrauterine device (IUD).  Smoking.  Drug and alcohol abuse.  Taking certain antibiotic medicines.  Being pregnant.  You cannot get bacterial vaginosis from toilet seats, bedding, swimming pools, or contact with objects around you. What are the signs or symptoms? Symptoms of this condition include:  Grey or white vaginal discharge. The discharge can also be watery or foamy.  A fish-like odor with discharge, especially after sexual intercourse or during menstruation.  Itching in and around the vagina.  Burning or pain with urination.  Some women with bacterial vaginosis have no signs or symptoms. How is this diagnosed? This condition is diagnosed based on:  Your medical history.  A physical exam of the vagina.  Testing a sample of vaginal fluid under a microscope to look for a large amount of bad bacteria or abnormal cells. Your health care provider may use a cotton swab  or a small wooden spatula to collect the sample.  How is this treated? This condition is treated with antibiotics. These may be given as a pill, a vaginal cream, or a medicine that is put into the vagina (suppository). If the condition comes back after treatment, a second round of antibiotics may be needed. Follow these instructions at home: Medicines  Take over-the-counter and prescription medicines only as told by your health care provider.  Take or use your antibiotic as told by your health care provider. Do not stop taking or using the antibiotic even if you start to feel better. General instructions  If you have a female sexual partner, tell her that you have a vaginal infection. She should see her health care provider and be treated if she has symptoms. If you have a female sexual partner, he does not need treatment.  During treatment: ? Avoid sexual activity until you finish treatment. ? Do not douche. ? Avoid alcohol as directed by your health care provider. ? Avoid breastfeeding as directed by your health care provider.  Drink enough water and fluids to keep your urine clear or pale yellow.  Keep the area around your vagina and rectum clean. ? Wash the area daily with warm water. ? Wipe yourself from front to back after using the toilet.  Keep all follow-up visits as told by your health care provider. This is important. How is this prevented?  Do not douche.  Wash the outside of your vagina with warm water only.  Use protection when having sex. This includes latex condoms and dental dams.  Limit how many sexual partners you have. To help prevent bacterial vaginosis, it is best to have sex with just  one partner (monogamous). °· Make sure you and your sexual partner are tested for STIs. °· Wear cotton or cotton-lined underwear. °· Avoid wearing tight pants and pantyhose, especially during summer. °· Limit the amount of alcohol that you drink. °· Do not use any products that  contain nicotine or tobacco, such as cigarettes and e-cigarettes. If you need help quitting, ask your health care provider. °· Do not use illegal drugs. °Where to find more information: °· Centers for Disease Control and Prevention: www.cdc.gov/std °· American Sexual Health Association (ASHA): www.ashastd.org °· U.S. Department of Health and Human Services, Office on Women's Health: www.womenshealth.gov/ or https://www.womenshealth.gov/a-z-topics/bacterial-vaginosis °Contact a health care provider if: °· Your symptoms do not improve, even after treatment. °· You have more discharge or pain when urinating. °· You have a fever. °· You have pain in your abdomen. °· You have pain during sex. °· You have vaginal bleeding between periods. °Summary °· Bacterial vaginosis is a vaginal infection that occurs when the normal balance of bacteria in the vagina is disrupted. °· Because bacterial vaginosis increases your risk for STIs (sexually transmitted infections), getting treated can help reduce your risk for chlamydia, gonorrhea, herpes, and HIV (human immunodeficiency virus). Treatment is also important for preventing complications in pregnant women, because the condition can cause an early (premature) delivery. °· This condition is treated with antibiotic medicines. These may be given as a pill, a vaginal cream, or a medicine that is put into the vagina (suppository). °This information is not intended to replace advice given to you by your health care provider. Make sure you discuss any questions you have with your health care provider. °Document Released: 06/28/2005 Document Revised: 11/01/2016 Document Reviewed: 03/13/2016 °Elsevier Interactive Patient Education © 2018 [employer] ° °Safe Medications in Pregnancy  ° °Acne: °Benzoyl Peroxide °Salicylic Acid ° °Backache/Headache: °Tylenol: 2 regular strength every 4 hours OR °             2 Extra strength every 6 hours ° °Colds/Coughs/Allergies: °Benadryl (alcohol free)  25 mg every 6 hours as needed °Breath right strips °Claritin °Cepacol throat lozenges °Chloraseptic throat spray °Cold-Eeze- up to three times per day °Cough drops, alcohol free °Flonase (by prescription only) °Guaifenesin °Mucinex °Robitussin DM (plain only, alcohol free) °Saline nasal spray/drops °Sudafed (pseudoephedrine) & Actifed ** use only after [redacted] weeks gestation and if you do not have high blood pressure °Tylenol °Vicks Vaporub °Zinc lozenges °Zyrtec  ° °Constipation: °Colace °Ducolax suppositories °Fleet enema °Glycerin suppositories °Metamucil °Milk of magnesia °Miralax °Senokot °Smooth move tea ° °Diarrhea: °Kaopectate °Imodium A-D ° °*NO pepto Bismol ° °Hemorrhoids: °Anusol °Anusol HC °Preparation H °Tucks ° °Indigestion: °Tums °Maalox °Mylanta °Zantac  °Pepcid ° °Insomnia: °Benadryl (alcohol free) 25mg every 6 hours as needed °Tylenol PM °Unisom, no Gelcaps ° °Leg Cramps: °Tums °MagGel ° °Nausea/Vomiting:  °Bonine °Dramamine °Emetrol °Ginger extract °Sea bands °Meclizine  °Nausea medication to take during pregnancy:  °Unisom (doxylamine succinate 25 mg tablets) Take one tablet daily at bedtime. If symptoms are not adequately controlled, the dose can be increased to a maximum recommended dose of two tablets daily (1/2 tablet in the morning, 1/2 tablet mid-afternoon and one at bedtime). °Vitamin B6 100mg tablets. Take one tablet twice a day (up to 200 mg per day). ° °Skin Rashes: °Aveeno products °Benadryl cream or 25mg every 6 hours as needed °Calamine Lotion °1% cortisone cream ° °Yeast infection: °Gyne-lotrimin 7 °Monistat 7 ° ° °**If taking multiple medications, please check labels to avoid duplicating the same active   ingredients **take medication as directed on the label ** Do not exceed 4000 mg of tylenol in 24 hours **Do not take medications that contain aspirin or ibuprofen    [facility]    [facility]       Phone:  [phone]  [facility] [place] Phone: [phone]  [facility] [place]  Phone: [phone]  [facility] [place]  Phone: [phone]  [facility] [place]  Phone: [phone]  [facility]       Phone: [phone]  [facility]    Phone: [phone]   [facility]    Phone: [phone]  [facility]    Phone: [phone]  [facility]    Phone: [phone]  [facility]    Phone: [phone]  [facility]       Phone: [phone]   [facility]  Phone: [phone]  [facility]    Phone: [phone]  [facility]   Phone: [phone]  [facility]      Phone: [phone]  [facility]    Phone: [phone]

## 2017-08-25 NOTE — MAU Note (Signed)
Having sharp pains in lower abd and back, getting worse.  Was here yesterday, had to leave to pick up daughter. Did have blood work done, left before [place]

## 2017-08-26 ENCOUNTER — Ambulatory Visit (HOSPITAL_COMMUNITY): Payer: Medicaid Other

## 2017-08-26 LAB — GC/CHLAMYDIA PROBE AMP (~~LOC~~) NOT AT ARMC
Chlamydia: NEGATIVE
Neisseria Gonorrhea: NEGATIVE

## 2017-08-31 ENCOUNTER — Ambulatory Visit: Payer: Self-pay | Admitting: Obstetrics and Gynecology

## 2017-09-19 ENCOUNTER — Encounter (HOSPITAL_COMMUNITY): Payer: Self-pay | Admitting: *Deleted

## 2017-09-19 ENCOUNTER — Inpatient Hospital Stay (HOSPITAL_COMMUNITY)
Admission: AD | Admit: 2017-09-19 | Discharge: 2017-09-19 | Disposition: A | Discharge status: Home / Self Care | Payer: Medicaid Other | Source: Ambulatory Visit | Attending: Obstetrics & Gynecology | Admitting: Obstetrics & Gynecology

## 2017-09-19 ENCOUNTER — Inpatient Hospital Stay (HOSPITAL_COMMUNITY): Payer: Medicaid Other

## 2017-09-19 DIAGNOSIS — A5611 Chlamydial female pelvic inflammatory disease: Secondary | ICD-10-CM | POA: Insufficient documentation

## 2017-09-19 DIAGNOSIS — A749 Chlamydial infection, unspecified: Secondary | ICD-10-CM

## 2017-09-19 DIAGNOSIS — R109 Unspecified abdominal pain: Secondary | ICD-10-CM | POA: Diagnosis present

## 2017-09-19 DIAGNOSIS — N739 Female pelvic inflammatory disease, unspecified: Secondary | ICD-10-CM | POA: Diagnosis present

## 2017-09-19 LAB — CBC WITH DIFFERENTIAL/PLATELET
Basophils Absolute: 0 10*3/uL (ref 0.0–0.1)
Basophils Relative: 0 %
Eosinophils Absolute: 0.1 10*3/uL (ref 0.0–0.7)
Eosinophils Relative: 1 %
HCT: 34.3 % — ABNORMAL LOW (ref 36.0–46.0)
Hemoglobin: 11.3 g/dL — ABNORMAL LOW (ref 12.0–15.0)
Lymphocytes Relative: 20 %
Lymphs Abs: 1.5 10*3/uL (ref 0.7–4.0)
MCH: 27 pg (ref 26.0–34.0)
MCHC: 32.9 g/dL (ref 30.0–36.0)
MCV: 82.1 fL (ref 78.0–100.0)
Monocytes Absolute: 0.2 10*3/uL (ref 0.1–1.0)
Monocytes Relative: 2 %
Neutro Abs: 5.7 10*3/uL (ref 1.7–7.7)
Neutrophils Relative %: 77 %
Platelets: 189 10*3/uL (ref 150–400)
RBC: 4.18 MIL/uL (ref 3.87–5.11)
RDW: 14 % (ref 11.5–15.5)
WBC: 7.4 10*3/uL (ref 4.0–10.5)

## 2017-09-19 LAB — URINALYSIS, ROUTINE W REFLEX MICROSCOPIC
Bilirubin Urine: NEGATIVE
Glucose, UA: NEGATIVE mg/dL
Ketones, ur: NEGATIVE mg/dL
Leukocytes, UA: NEGATIVE
Nitrite: NEGATIVE
Protein, ur: NEGATIVE mg/dL
Specific Gravity, Urine: 1.02 (ref 1.005–1.030)
pH: 6 (ref 5.0–8.0)

## 2017-09-19 LAB — WET PREP, GENITAL
Clue Cells Wet Prep HPF POC: NONE SEEN
Sperm: NONE SEEN
Trich, Wet Prep: NONE SEEN
Yeast Wet Prep HPF POC: NONE SEEN

## 2017-09-19 MED ORDER — IBUPROFEN 800 MG PO TABS: 800.0000 mg | ORAL_TABLET | Freq: Once | ORAL | Status: DC

## 2017-09-19 MED ORDER — OXYCODONE-ACETAMINOPHEN 5-325 MG PO TABS: 1.0000 | ORAL_TABLET | Freq: Four times a day (QID) | ORAL | 0 refills | Status: DC | PRN

## 2017-09-19 MED ORDER — AZITHROMYCIN 250 MG PO TABS
1000.0000 mg | ORAL_TABLET | Freq: Once | ORAL | Status: AC
  Administered 2017-09-19: 1000 mg via ORAL
  Filled 2017-09-19: qty 4

## 2017-09-19 MED ORDER — KETOROLAC TROMETHAMINE 60 MG/2ML IM SOLN
60.0000 mg | Freq: Once | INTRAMUSCULAR | Status: AC
  Administered 2017-09-19: 60 mg via INTRAMUSCULAR
  Filled 2017-09-19: qty 2

## 2017-09-19 MED ORDER — OXYCODONE-ACETAMINOPHEN 5-325 MG PO TABS
2.0000 | ORAL_TABLET | Freq: Once | ORAL | Status: DC
  Filled 2017-09-19: qty 2

## 2017-09-19 MED ORDER — AZITHROMYCIN 500 MG PO TABS: 1000.0000 mg | ORAL_TABLET | Freq: Once | ORAL | 0 refills | Status: AC

## 2017-09-19 MED ORDER — CEFTRIAXONE SODIUM 250 MG IJ SOLR
250.0000 mg | Freq: Once | INTRAMUSCULAR | Status: AC
  Administered 2017-09-19: 250 mg via INTRAMUSCULAR
  Filled 2017-09-19: qty 250

## 2017-09-19 NOTE — MAU Provider Note (Signed)
History     CSN: [id number]  Arrival date and time: 09/19/17 1301   First Provider Initiated Contact with Patient 09/19/17 1336      Chief Complaint  Patient presents with  . Abdominal Pain  . Back Pain  . Vaginal Itching  . Vaginal Discharge   HPI  Ms.  Chloe Rodgers is a 27 y.o. year old [id number] female at 3746w0d weeks gestation who presents to MAU reporting a miscarriage at home on 3/1, dx with chlamydia, possible PID by her doctor, severe abdominal, lower back and vaginal pain since SAB, and vaginal itching. She states she called her doctor's office today to get results of chlamydia test, but "the results were not in so was instructed to come to ER for tx and to get pain checked out." She denies VB.  Past Medical History:  Diagnosis Date  . Anemia   . Anxiety   . Chlamydia   . Complication of anesthesia    ??, seizure with wisdom teeth  . Depression    not on meds, sees a therapist  . Infection    UTI  . Kidney stone   . Migraine   . PID (acute pelvic inflammatory disease) 2014  . PONV (postoperative nausea and vomiting)   . Psoriasis   . Seizures [facility](HCC)    [place]    Past Surgical History:  Procedure Laterality Date  . CESAREAN SECTION    . CESAREAN SECTION  06/06/2012   Procedure: CESAREAN SECTION;  Surgeon: Adam PhenixJames G Arnold, MD;  Location: [facility];  Service: Obstetrics;  Laterality: N/A;  . CESAREAN SECTION N/A 03/30/2017   Procedure: REPEAT CESAREAN SECTION;  Surgeon: Lazaro ArmsEure, Luther H, MD;  [facility];  Service: Obstetrics;  Laterality: N/A;  . SKIN GRAFT     off abd, onto arm  . WISDOM TOOTH EXTRACTION      Family History  Problem Relation Age of Onset  . Hypertension Mother   . Diabetes Mother   . Cancer Mother        BREAST  . Stroke Mother   . Seizures Mother   . Asthma Daughter   . Hypertension Maternal Grandmother   . Diabetes Maternal Grandmother   . Cancer Maternal Grandmother        BONE CANCER  . Other Neg  Hx     Social History   Tobacco Use  . Smoking status: Never Smoker  . Smokeless tobacco: Never Used  Substance Use Topics  . Alcohol use: No    Comment: socially but none with pregnancy  . Drug use: No    Allergies: No Known Allergies  Medications Prior to Admission  Medication Sig Dispense Refill Last Dose  . promethazine (PHENERGAN) 25 MG tablet Take 1 tablet (25 mg total) by mouth every 6 (six) hours as needed for nausea or vomiting. (Patient not taking: Reported on 09/19/2017) 30 tablet 0 Not Taking at Unknown time    Review of Systems  Constitutional: Negative.   HENT: Negative.   Eyes: Negative.   Respiratory: Negative.   Cardiovascular: Negative.   Gastrointestinal: Positive for abdominal pain.  Endocrine: Negative.   Genitourinary: Positive for pelvic pain, vaginal discharge and vaginal pain.  Musculoskeletal: Positive for back pain.  Skin: Negative.   Allergic/Immunologic: Negative.   Neurological: Negative.   Hematological: Negative.   Psychiatric/Behavioral: Negative.    Physical Exam   Blood pressure (!) (P) 141/77, pulse (P) 92, temperature (P) 98.8 F (37.1 C), temperature source (  P) Oral, resp. rate (P) 16, height (P) 5\' 6"  (1.676 m), weight (P) 236 lb (107 kg), last menstrual period 07/11/2017, SpO2 (P) 100 %.  Physical Exam  Nursing note and vitals reviewed. Constitutional: She is oriented to person, place, and time. She appears well-developed and well-nourished.  HENT:  Head: Normocephalic and atraumatic.  Eyes: Pupils are equal, round, and reactive to light.  Neck: Normal range of motion.  Cardiovascular: Normal rate, regular rhythm and normal heart sounds.  Respiratory: Effort normal and breath sounds normal.  GI: Soft. Bowel sounds are normal. There is tenderness in the right lower quadrant and left lower quadrant.  Genitourinary:  Genitourinary Comments: Uterus: moderate tenderness, SE: cervix is smooth, pink, no lesions, small amt of thick,  tannish-pink, significantly malodorous vaginal d/c -- WP, GC/CT done, closed/long/firm, (+) CMT or friability, moderate bilateral adnexal tenderness   Musculoskeletal: Normal range of motion.  Neurological: She is alert and oriented to person, place, and time.  Skin: Skin is warm and dry.  Psychiatric: She has a normal mood and affect. Her behavior is normal. Judgment and thought content normal.   MAU Course  Procedures  MDM CCUA CBC with diff OB TVUS Azithromycin 1000 mg po  Rocephin 250 mg IM Toradol 60 mg IM -- improved pain  Results for orders placed or performed during the hospital encounter of 09/19/17 (from the past 24 hour(s))  Urinalysis, Routine w reflex microscopic     Status: Abnormal   Collection Time: 09/19/17  1:12 PM  Result Value Ref Range   Color, Urine YELLOW YELLOW   APPearance HAZY (A) CLEAR   Specific Gravity, Urine 1.020 1.005 - 1.030   pH 6.0 5.0 - 8.0   Glucose, UA NEGATIVE NEGATIVE mg/dL   Hgb urine dipstick MODERATE (A) NEGATIVE   Bilirubin Urine NEGATIVE NEGATIVE   Ketones, ur NEGATIVE NEGATIVE mg/dL   Protein, ur NEGATIVE NEGATIVE mg/dL   Nitrite NEGATIVE NEGATIVE   Leukocytes, UA NEGATIVE NEGATIVE   RBC / HPF 0-5 0 - 5 RBC/hpf   WBC, UA 0-5 0 - 5 WBC/hpf   Bacteria, UA RARE (A) NONE SEEN   Squamous Epithelial / LPF 0-5 (A) NONE SEEN   Mucus PRESENT   Wet prep, genital     Status: Abnormal   Collection Time: 09/19/17  1:44 PM  Result Value Ref Range   Yeast Wet Prep HPF POC NONE SEEN NONE SEEN   Trich, Wet Prep NONE SEEN NONE SEEN   Clue Cells Wet Prep HPF POC NONE SEEN NONE SEEN   WBC, Wet Prep HPF POC FEW (A) NONE SEEN   Sperm NONE SEEN   CBC with Differential/Platelet     Status: Abnormal   Collection Time: 09/19/17  2:26 PM  Result Value Ref Range   WBC 7.4 4.0 - 10.5 K/uL   RBC 4.18 3.87 - 5.11 MIL/uL   Hemoglobin 11.3 (L) 12.0 - 15.0 g/dL   HCT 91.4 (L) 78.2 - 95.6 %   MCV 82.1 78.0 - 100.0 fL   MCH 27.0 26.0 - 34.0 pg   MCHC  32.9 30.0 - 36.0 g/dL   RDW 21.3 08.6 - 57.8 %   Platelets 189 150 - 400 K/uL   Neutrophils Relative % 77 %   Neutro Abs 5.7 1.7 - 7.7 K/uL   Lymphocytes Relative 20 %   Lymphs Abs 1.5 0.7 - 4.0 K/uL   Monocytes Relative 2 %   Monocytes Absolute 0.2 0.1 - 1.0 K/uL   Eosinophils Relative  1 %   Eosinophils Absolute 0.1 0.0 - 0.7 K/uL   Basophils Relative 0 %   Basophils Absolute 0.0 0.0 - 0.1 K/uL     US Ob Transvaginal  Result Date: 09/19/2017 CLINICAL DATA:  27 year old pregnant female presents with abdominal pain, low back pain and vaginal bleeding. EDC by LMP: 04/17/2018, projecting to an expected gestational age of [redacted] weeks 0 days. EXAM: TRANSVAGINAL OB ULTRASOUND TECHNIQUE: Transvaginal ultrasound was performed for complete evaluation of the gestation as well as the maternal uterus, adnexal regions, and pelvic cul-de-sac. COMPARISON:  08/25/2017 obstetric scan. FINDINGS: Intrauterine gestational sac: No intrauterine gestational sac demonstrated. Previously visualized intrauterine gestational sac is absent on today's scan. Maternal uterus/adnexae: Anteverted uterus with no uterine fibroids. Bilayer endometrial thickness 5 mm. Heterogeneous endometrium with trace endometrial cavity fluid, no focal endometrial mass and no definite endometrial vascularity on color Doppler. Small volume simple free fluid in the pelvic cul-de-sac. Right ovary measures 2.8 x 1.7 x 1.8 cm and contains a corpus luteum. Left ovary measures 2.1 x 1.1 x 1.2 cm. No abnormal ovarian or adnexal masses. IMPRESSION: Sonographic findings are compatible with interval spontaneous abortion. Thin endometrium (5 mm). Trace endometrial cavity fluid. Previously visualized intrauterine gestational sac on 08/25/2017 obstetric scan is absent on today's scan. No abnormal ovarian or adnexal masses. Electronically Signed   By: Delbert Phenix M.D.   On: 09/19/2017 15:29    Assessment and Plan  Chlamydia infection  - Tx'd with Rocephin 250 mg  IM & Azithromycin 1000 mg po - Rx for Azithromycin 1000 mg po - Information provided on Chlamydia infection - Advised to F/U with her FNP in 3-4 wks  Pelvic inflammatory disease (PID)  - Tx'd with Rocephin 250 mg IM & Azithromycin 1000 mg po - Information provided on PID   - Discharge patient - Patient verbalized an understanding of the plan of care and agrees.   Raelyn Mora, MSN, CNM 09/19/2017, 2:03 PM

## 2017-09-19 NOTE — Discharge Instructions (Signed)
In late 2019, the [facility] will be moving to the [place] campus. At that time, the MAU (Maternity Admissions Unit), where you are being seen today, will no longer take care of non-pregnant patients. We strongly encourage you to find a doctor's office before that time, so that you can be seen with any GYN concerns, like vaginal discharge, urinary tract infection, etc.. in a timely manner. ° °In order to make an office visit more convenient, the [facility] will be offering evening hours with same-day appointments, walk-in appointments and scheduled appointments available during this time. ° °[facility] Hours: °Monday - 8am - 7:30 pm with walk-in between 4pm- 7:30 pm °Tuesday - 8 am - 5 pm (starting 10/11/17 we will be open late and accepting walk-ins from 4pm - 7:30pm) °Wednesday - 8 am - 5 pm (starting 01/11/18 we will be open late and accepting walk-ins from 4pm - 7:30pm) °Thursday 8 am - 5 pm (starting 04/13/18 we will be open late and accepting walk-ins from 4pm - 7:30pm) °Friday 8 am - 5 pm ° °For an appointment please call the [facility] at [phone] ° °For urgent needs, [facility] is also available for management of urgent GYN complaints such as vaginal discharge or urinary tract infections. ° ° ° ° ° °

## 2017-09-19 NOTE — MAU Note (Signed)
Pt reports she had a miscarriage on 03/01. States she was dx with chlamydia and ? PID. States she is having severe pain in her lower abd, lower back, and vaginal area. Also reports vaginal itching.

## 2017-09-20 LAB — GC/CHLAMYDIA PROBE AMP (~~LOC~~) NOT AT ARMC
Chlamydia: NEGATIVE
Neisseria Gonorrhea: NEGATIVE

## 2017-09-28 ENCOUNTER — Ambulatory Visit (INDEPENDENT_AMBULATORY_CARE_PROVIDER_SITE_OTHER): Payer: Self-pay | Admitting: Family

## 2017-09-28 ENCOUNTER — Encounter: Payer: Self-pay | Admitting: Advanced Practice Midwife

## 2017-09-28 ENCOUNTER — Encounter: Payer: Self-pay | Admitting: Family

## 2017-09-28 DIAGNOSIS — N739 Female pelvic inflammatory disease, unspecified: Secondary | ICD-10-CM

## 2017-09-28 MED ORDER — MEDROXYPROGESTERONE ACETATE 150 MG/ML IM SUSP: 150.0000 mg | Freq: Once | INTRAMUSCULAR | Status: DC

## 2017-09-28 NOTE — Progress Notes (Signed)
Patient was in clinic today with child and is interested in birth control.  Is scheduled for OB new patient visit on Friday.  Advised to follow up with that appointment. I will check in with her next time she is in clinic with child.

## 2017-09-29 ENCOUNTER — Inpatient Hospital Stay (HOSPITAL_COMMUNITY)
Admission: AD | Admit: 2017-09-29 | Discharge: 2017-10-02 | DRG: 779 | Disposition: A | Discharge status: Home / Self Care | Payer: Medicaid Other | Source: Ambulatory Visit | Attending: Obstetrics and Gynecology | Admitting: Obstetrics and Gynecology

## 2017-09-29 ENCOUNTER — Other Ambulatory Visit: Payer: Self-pay

## 2017-09-29 ENCOUNTER — Inpatient Hospital Stay (HOSPITAL_COMMUNITY)
Admission: AD | Admit: 2017-09-29 | Discharge: 2017-09-29 | Discharge status: Against Medical Advice | Payer: Medicaid Other | Source: Ambulatory Visit | Attending: Obstetrics and Gynecology | Admitting: Obstetrics and Gynecology

## 2017-09-29 ENCOUNTER — Encounter (HOSPITAL_COMMUNITY): Payer: Self-pay | Admitting: *Deleted

## 2017-09-29 ENCOUNTER — Inpatient Hospital Stay (HOSPITAL_COMMUNITY): Payer: Medicaid Other

## 2017-09-29 DIAGNOSIS — R109 Unspecified abdominal pain: Secondary | ICD-10-CM

## 2017-09-29 DIAGNOSIS — N730 Acute parametritis and pelvic cellulitis: Secondary | ICD-10-CM

## 2017-09-29 DIAGNOSIS — N739 Female pelvic inflammatory disease, unspecified: Secondary | ICD-10-CM | POA: Diagnosis present

## 2017-09-29 DIAGNOSIS — O3680X0 Pregnancy with inconclusive fetal viability, not applicable or unspecified: Secondary | ICD-10-CM

## 2017-09-29 DIAGNOSIS — R110 Nausea: Secondary | ICD-10-CM

## 2017-09-29 DIAGNOSIS — O26899 Other specified pregnancy related conditions, unspecified trimester: Secondary | ICD-10-CM

## 2017-09-29 DIAGNOSIS — O035 Genital tract and pelvic infection following complete or unspecified spontaneous abortion: Principal | ICD-10-CM | POA: Diagnosis present

## 2017-09-29 DIAGNOSIS — Z3491 Encounter for supervision of normal pregnancy, unspecified, first trimester: Secondary | ICD-10-CM

## 2017-09-29 LAB — CBC WITH DIFFERENTIAL/PLATELET
Basophils Absolute: 0 10*3/uL (ref 0.0–0.1)
Basophils Relative: 0 %
Eosinophils Absolute: 0.1 10*3/uL (ref 0.0–0.7)
Eosinophils Relative: 1 %
HCT: 35.5 % — ABNORMAL LOW (ref 36.0–46.0)
Hemoglobin: 11.8 g/dL — ABNORMAL LOW (ref 12.0–15.0)
Lymphocytes Relative: 25 %
Lymphs Abs: 1.7 10*3/uL (ref 0.7–4.0)
MCH: 27.1 pg (ref 26.0–34.0)
MCHC: 33.2 g/dL (ref 30.0–36.0)
MCV: 81.4 fL (ref 78.0–100.0)
Monocytes Absolute: 0.2 10*3/uL (ref 0.1–1.0)
Monocytes Relative: 4 %
Neutro Abs: 4.7 10*3/uL (ref 1.7–7.7)
Neutrophils Relative %: 70 %
Platelets: 191 10*3/uL (ref 150–400)
RBC: 4.36 MIL/uL (ref 3.87–5.11)
RDW: 13.9 % (ref 11.5–15.5)
WBC: 6.7 10*3/uL (ref 4.0–10.5)

## 2017-09-29 LAB — URINALYSIS, ROUTINE W REFLEX MICROSCOPIC
Bilirubin Urine: NEGATIVE
Glucose, UA: NEGATIVE mg/dL
Hgb urine dipstick: NEGATIVE
Ketones, ur: NEGATIVE mg/dL
Nitrite: NEGATIVE
Protein, ur: NEGATIVE mg/dL
Specific Gravity, Urine: 1.021 (ref 1.005–1.030)
pH: 6 (ref 5.0–8.0)

## 2017-09-29 LAB — COMPREHENSIVE METABOLIC PANEL
ALT: 18 U/L (ref 14–54)
AST: 15 U/L (ref 15–41)
Albumin: 3.7 g/dL (ref 3.5–5.0)
Alkaline Phosphatase: 81 U/L (ref 38–126)
Anion gap: 8 (ref 5–15)
BUN: 15 mg/dL (ref 6–20)
CO2: 23 mmol/L (ref 22–32)
Calcium: 8.8 mg/dL — ABNORMAL LOW (ref 8.9–10.3)
Chloride: 105 mmol/L (ref 101–111)
Creatinine, Ser: 0.72 mg/dL (ref 0.44–1.00)
GFR calc Af Amer: >60 mL/min (ref >60)
GFR calc non Af Amer: >60 mL/min (ref >60)
Glucose, Bld: 82 mg/dL (ref 65–99)
Potassium: 4 mmol/L (ref 3.5–5.1)
Sodium: 136 mmol/L (ref 135–145)
Total Bilirubin: 0.3 mg/dL (ref 0.3–1.2)
Total Protein: 7 g/dL (ref 6.5–8.1)

## 2017-09-29 LAB — POCT PREGNANCY, URINE: Preg Test, Ur: NEGATIVE

## 2017-09-29 LAB — HCG, SERUM, QUALITATIVE: Preg, Serum: POSITIVE — AB

## 2017-09-29 LAB — HCG, QUANTITATIVE, PREGNANCY: hCG, Beta Chain, Quant, S: 57 m[IU]/mL — ABNORMAL HIGH (ref <5)

## 2017-09-29 MED ORDER — ACETAMINOPHEN 325 MG PO TABS
650.0000 mg | ORAL_TABLET | ORAL | Status: DC | PRN
  Administered 2017-09-29: 650 mg via ORAL
  Filled 2017-09-29 (×2): qty 2

## 2017-09-29 MED ORDER — SODIUM CHLORIDE 0.9 % IV SOLN: 250.0000 mL | INTRAVENOUS | Status: DC | PRN

## 2017-09-29 MED ORDER — SODIUM CHLORIDE 0.9% FLUSH
3.0000 mL | Freq: Two times a day (BID) | INTRAVENOUS | Status: DC
  Administered 2017-10-01: 3 mL via INTRAVENOUS

## 2017-09-29 MED ORDER — SIMETHICONE 80 MG PO CHEW: 80.0000 mg | CHEWABLE_TABLET | Freq: Four times a day (QID) | ORAL | Status: DC | PRN

## 2017-09-29 MED ORDER — SODIUM CHLORIDE 0.9% FLUSH
3.0000 mL | INTRAVENOUS | Status: DC | PRN
  Administered 2017-09-30 – 2017-10-01 (×4): 3 mL via INTRAVENOUS
  Filled 2017-09-29 (×4): qty 3

## 2017-09-29 MED ORDER — PRENATAL MULTIVITAMIN CH
1.0000 | ORAL_TABLET | Freq: Every day | ORAL | Status: DC
  Filled 2017-09-29 (×2): qty 1

## 2017-09-29 MED ORDER — DOCUSATE SODIUM 100 MG PO CAPS
100.0000 mg | ORAL_CAPSULE | Freq: Two times a day (BID) | ORAL | Status: DC
  Administered 2017-09-29 – 2017-10-01 (×3): 100 mg via ORAL
  Filled 2017-09-29 (×4): qty 1

## 2017-09-29 MED ORDER — SODIUM CHLORIDE 0.9 % IV SOLN
2.0000 g | Freq: Four times a day (QID) | INTRAVENOUS | Status: DC
  Administered 2017-09-29 – 2017-10-02 (×10): 2 g via INTRAVENOUS
  Filled 2017-09-29 (×11): qty 2

## 2017-09-29 MED ORDER — AZITHROMYCIN 250 MG PO TABS
1000.0000 mg | ORAL_TABLET | Freq: Once | ORAL | Status: AC
  Administered 2017-09-29: 1000 mg via ORAL
  Filled 2017-09-29: qty 4

## 2017-09-29 NOTE — MAU Provider Note (Signed)
History     CSN: [id number]  Arrival date and time: 09/29/17 1232   First Provider Initiated Contact with Patient 09/29/17 1558      Chief Complaint  Patient presents with  . Abdominal Pain  . Vaginal Bleeding   HPI   Ms.Chloe Rodgers is a 27 y.o. female [phone] here in MAU with abdominal pain.  She was seen by her PCP in February and was diagnosed with chlamydia and PID; she took the treatment for this.  On March 1 she had a miscarriage at home and never saw a provider for this, never had follow up. States she was early pregnant and her bleeding stopped at one point. It has continued to wax and wane since the SAB.   She presented to MAU on March 11 with pain and had an [place] that confirmed SAB. She continued to have pelvic pain and was again treatment again for PID in MAU.   She Went to get a nexplanon yesterday and had a positive pregnancy test.  States she is here because she has pain in her lower abdomen and is concerned about the positive pregnancy test. The pain is in both sides of her lower abdomen. The pain is constant. Says nothing is helping the pain. Says she took her temp last night and it was 102.1.  Says she has had unprotected sex "well with a condom". since the miscarriage.  Overall feels "ill"   OB History    Gravida  5   Para  3   Term  3   Preterm  0   AB  2   Living  3     SAB  0   TAB  1   Ectopic  0   Multiple  0   Live Births  3           Past Medical History:  Diagnosis Date  . Anemia   . Anxiety   . Chlamydia   . Complication of anesthesia    ??, seizure with wisdom teeth  . Depression    not on meds, sees a therapist  . Infection    UTI  . Kidney stone   . Migraine   . PID (acute pelvic inflammatory disease) 2014  . PONV (postoperative nausea and vomiting)   . Psoriasis   . Seizures [facility])    July 2013 - [place]    Past Surgical History:  Procedure Laterality Date  . CESAREAN SECTION    . CESAREAN SECTION   06/06/2012   Procedure: CESAREAN SECTION;  Surgeon: Woodroe Mode, MD;  Location: [facility];  Service: Obstetrics;  Laterality: N/A;  . CESAREAN SECTION N/A 03/30/2017   Procedure: REPEAT CESAREAN SECTION;  Surgeon: Florian Buff, MD;  Location: [facility];  Service: Obstetrics;  Laterality: N/A;  . SKIN GRAFT     off abd, onto arm  . WISDOM TOOTH EXTRACTION      Family History  Problem Relation Age of Onset  . Hypertension Mother   . Diabetes Mother   . Cancer Mother        BREAST  . Stroke Mother   . Seizures Mother   . Asthma Daughter   . Hypertension Maternal Grandmother   . Diabetes Maternal Grandmother   . Cancer Maternal Grandmother        BONE CANCER  . Other Neg Hx     Social History   Tobacco Use  . Smoking status: Never Smoker  . Smokeless tobacco: Never  Used  Substance Use Topics  . Alcohol use: No    Comment: socially but none with pregnancy  . Drug use: No    Allergies: No Known Allergies  Medications Prior to Admission  Medication Sig Dispense Refill Last Dose  . oxyCODONE-acetaminophen (PERCOCET/ROXICET) 5-325 MG tablet Take 1 tablet by mouth every 6 (six) hours as needed for severe pain. 20 tablet 0   . promethazine (PHENERGAN) 25 MG tablet Take 1 tablet (25 mg total) by mouth every 6 (six) hours as needed for nausea or vomiting. (Patient not taking: Reported on 09/19/2017) 30 tablet 0 Not Taking at Unknown time    Results for orders placed or performed during the hospital encounter of 09/29/17 (from the past 48 hour(s))  Urinalysis, Routine w reflex microscopic     Status: Abnormal   Collection Time: 09/29/17  1:39 PM  Result Value Ref Range   Color, Urine YELLOW YELLOW   APPearance CLOUDY (A) CLEAR   Specific Gravity, Urine 1.021 1.005 - 1.030   pH 6.0 5.0 - 8.0   Glucose, UA NEGATIVE NEGATIVE mg/dL   Hgb urine dipstick NEGATIVE NEGATIVE   Bilirubin Urine NEGATIVE NEGATIVE   Ketones, ur NEGATIVE NEGATIVE mg/dL   Protein, ur NEGATIVE  NEGATIVE mg/dL   Nitrite NEGATIVE NEGATIVE   Leukocytes, UA SMALL (A) NEGATIVE   RBC / HPF 0-5 0 - 5 RBC/hpf   WBC, UA 6-30 0 - 5 WBC/hpf   Bacteria, UA RARE (A) NONE SEEN   Squamous Epithelial / LPF 6-30 (A) NONE SEEN   Mucus PRESENT     Comment: Performed at [facility], [address] [place], [place] [address]  Pregnancy, urine POC     Status: None   Collection Time: 09/29/17  2:02 PM  Result Value Ref Range   Preg Test, Ur NEGATIVE NEGATIVE    Comment:        THE SENSITIVITY OF THIS METHODOLOGY IS >24 mIU/mL   hCG, serum, qualitative     Status: Abnormal   Collection Time: 09/29/17  4:25 PM  Result Value Ref Range   Preg, Serum POSITIVE (A) NEGATIVE    Comment:        THE SENSITIVITY OF THIS METHODOLOGY IS >10 mIU/mL. Performed at [facility], [address] [place], [place] [address]   hCG, quantitative, pregnancy     Status: Abnormal   Collection Time: 09/29/17  5:00 PM  Result Value Ref Range   hCG, Beta Chain, Quant, S 57 (H) <5 mIU/mL    Comment:          GEST. AGE      CONC.  (mIU/mL)   <=1 WEEK        5 - 50     2 WEEKS       50 - 500     3 WEEKS       100 - 10,000     4 WEEKS     1,000 - 30,000     5 WEEKS     3,500 - 115,000   6-8 WEEKS     12,000 - 270,000    12 WEEKS     15,000 - 220,000        FEMALE AND NON-PREGNANT FEMALE:     LESS THAN 5 mIU/mL Performed at [facility], [address], [place], [place] [address]   Comprehensive metabolic panel     Status: Abnormal   Collection Time: 09/29/17  5:00 PM  Result Value Ref Range  Sodium 136 135 - 145 mmol/L   Potassium 4.0 3.5 - 5.1 mmol/L   Chloride 105 101 - 111 mmol/L   CO2 23 22 - 32 mmol/L   Glucose, Bld 82 65 - 99 mg/dL   BUN 15 6 - 20 mg/dL   Creatinine, Ser 0.72 0.44 - 1.00 mg/dL   Calcium 8.8 (L) 8.9 - 10.3 mg/dL   Total Protein 7.0 6.5 - 8.1 g/dL   Albumin 3.7 3.5 - 5.0 g/dL   AST 15 15 - 41 U/L   ALT 18 14 - 54 U/L   Alkaline Phosphatase 81 38 - 126 U/L   Total  Bilirubin 0.3 0.3 - 1.2 mg/dL   GFR calc non Af Amer >60 >60 mL/min   GFR calc Af Amer >60 >60 mL/min    Comment: (NOTE) The eGFR has been calculated using the CKD EPI equation. This calculation has not been validated in all clinical situations. eGFR's persistently <60 mL/min signify possible Chronic Kidney Disease.    Anion gap 8 5 - 15    Comment: Performed at [facility], [address] [place], [place] [address]  CBC with Differential     Status: Abnormal   Collection Time: 09/29/17  5:00 PM  Result Value Ref Range   WBC 6.7 4.0 - 10.5 K/uL   RBC 4.36 3.87 - 5.11 MIL/uL   Hemoglobin 11.8 (L) 12.0 - 15.0 g/dL   HCT 35.5 (L) 36.0 - 46.0 %   MCV 81.4 78.0 - 100.0 fL   MCH 27.1 26.0 - 34.0 pg   MCHC 33.2 30.0 - 36.0 g/dL   RDW 13.9 11.5 - 15.5 %   Platelets 191 150 - 400 K/uL   Neutrophils Relative % 70 %   Neutro Abs 4.7 1.7 - 7.7 K/uL   Lymphocytes Relative 25 %   Lymphs Abs 1.7 0.7 - 4.0 K/uL   Monocytes Relative 4 %   Monocytes Absolute 0.2 0.1 - 1.0 K/uL   Eosinophils Relative 1 %   Eosinophils Absolute 0.1 0.0 - 0.7 K/uL   Basophils Relative 0 %   Basophils Absolute 0.0 0.0 - 0.1 K/uL    Comment: Performed at [facility], [address] [place],  [address]    US Ob Transvaginal  Result Date: 09/29/2017 CLINICAL DATA:  Pelvic cramping for the past 2 days. Recent miscarriage. Unknown last menstrual period. Positive qualitative serum beta HCG. EXAM: TRANSVAGINAL OB ULTRASOUND TECHNIQUE: Transvaginal ultrasound was performed for complete evaluation of the gestation as well as the maternal uterus, adnexal regions, and pelvic cul-de-sac. COMPARISON:  09/19/2017 and 08/25/2017. FINDINGS: Intrauterine gestational sac: Not visualized Maternal uterus/adnexae: Normal appearing endometrial stripe measuring 1 mm in maximum thickness. Normal appearing maternal ovaries. No free peritoneal fluid or adnexal masses. IMPRESSION: Normal pelvic ultrasound with no  intrauterine or extrauterine gestation seen and no retained products of conception. Electronically Signed   By: Claudie Revering M.D.   On: 09/29/2017 17:39   Review of Systems  Constitutional: Positive for chills and fever.  Gastrointestinal: Positive for abdominal pain and nausea. Negative for vomiting.  Genitourinary: Positive for pelvic pain. Negative for vaginal bleeding.  Musculoskeletal: Positive for back pain.   Physical Exam   Blood pressure 118/76, pulse 79, temperature 98.3 F (36.8 C), temperature source Oral, resp. rate 17, weight 236 lb 8 oz (107.3 kg), SpO2 100 %, unknown if currently breastfeeding.  Physical Exam  Constitutional: She is oriented to person, place, and time. She appears well-developed and well-nourished. No distress.  HENT:  Head: Normocephalic.  GI: Soft. There is tenderness in the periumbilical area, suprapubic area and left lower quadrant. There is no rigidity, no rebound and no guarding.  Genitourinary:  Genitourinary Comments: Bimanual exam: Cervix closed, significant CMT Uterine tenderness  Adnexa non tender, no masses bilaterally Chaperone present for exam.   Musculoskeletal: Normal range of motion.  Neurological: She is alert and oriented to person, place, and time.  Skin: Skin is warm. She is not diaphoretic.  Psychiatric: Her behavior is normal.   MAU Course  Procedures  None  MDM  CBC with diff CMP Quant  UA Ultrasound Dr. Rosana Hoes called to MAU to discuss patient; admission vs. Discharge.  Patient requesting to leave for a very brief period to find arrangements for her 75 month old baby. Dr. Rosana Hoes aware of this and discussed the risks with the patient. Patient signed AMA form and will return for a direct 3rd floor admission.   Assessment and Plan   A:  Acute PID, failed outpatient treatment X 2 Pregnancy of unknown anatomical location Abdominal pain in pregnancy    P:  Admit to 3rd floor for IV antibiotics Quant in 48 hours.     Lezlie Lye, NP 09/29/2017 7:44 PM

## 2017-09-29 NOTE — H&P (Signed)
GYN History and Physical  Chloe Rodgers is a 27 y.o. [id number] who presents with abdominal pain.  She was seen by her PCP in February and was diagnosed with chlamydia and PID; she took the treatment for this. On March 1 she had a miscarriage at home and never saw a provider for this, never had follow up. States she was early pregnant and her bleeding stopped at one point. It has continued to wax and wane since the SAB.   She presented to [facility]U on March 11 with pain and had an US that confirmed SAB. She continued to have pelvic pain and was again treatment again for PID in [facility].   She went to get a nexplanon yesterday and had a positive pregnancy test.  States she is here because she has pain in her lower abdomen and is concerned about the positive pregnancy test. The pain is in both sides of her lower abdomen. The pain is constant. Says nothing is helping the pain. Says she took her temp last night and it was 102.1.  Says she has had unprotected sex "well with a condom". since the miscarriage.  Overall feels "ill"   Medical History:  Past Medical History:  Diagnosis Date  . Anemia   . Anxiety   . Chlamydia   . Complication of anesthesia    ??, seizure with wisdom teeth  . Depression    not on meds, sees a therapist  . Infection    UTI  . Kidney stone   . Migraine   . PID (acute pelvic inflammatory disease) 2014  . PONV (postoperative nausea and vomiting)   . Psoriasis   . [facility]    [place]    Past Surgical History:  Procedure Laterality Date  . CESAREAN SECTION    . CESAREAN SECTION  06/06/2012   Procedure: CESAREAN SECTION;  Surgeon: Adam PhenixJames G Arnold, MD;  Location: [facility];  Service: Obstetrics;  Laterality: N/A;  . CESAREAN SECTION N/A 03/30/2017   Procedure: REPEAT CESAREAN SECTION;  Surgeon: Lazaro ArmsEure, Luther H, MD;  [facility];  Service: Obstetrics;  Laterality: N/A;  . SKIN GRAFT     off abd, onto arm  . WISDOM TOOTH EXTRACTION       OB History  Gravida Para Term Preterm AB Living  5 3 3  0 2 3  SAB TAB Ectopic Multiple Live Births  0 1 0 0 3    # Outcome Date GA Lbr Len/2nd Weight Sex Delivery Anes PTL Lv  5 AB 09/13/17 4770w1d         4 Term 03/30/17 5853w3d  6 lb 13 oz (3.09 kg) M CS-Vac Spinal  LIV  3 TAB 2015             Birth Comments: System Generated. Please review and update pregnancy details.  2 Term 06/06/12 6661w0d  7 lb 15.9 oz (3.625 kg) F CS-LTranv Spinal  LIV  1 Term 02/25/11 2865w0d  8 lb 14 oz (4.026 kg) F CS-LTranv EPI N LIV     Birth Comments: c/s  due to "BP dropped"    Social History   Socioeconomic History  . Marital status: Single    Spouse name: Not on file  . Number of children: Not on file  . Years of education: Not on file  . Highest education level: Not on file  Occupational History  . Not on file  Social Needs  . Financial resource strain: Not on file  .  Food insecurity:    Worry: Not on file    Inability: Not on file  . Transportation needs:    Medical: Not on file    Non-medical: Not on file  Tobacco Use  . Smoking status: Never Smoker  . Smokeless tobacco: Never Used  Substance and Sexual Activity  . Alcohol use: No    Comment: socially but none with pregnancy  . Drug use: No  . Sexual activity: Yes    Birth control/protection: None  Lifestyle  . Physical activity:    Days per week: Not on file    Minutes per session: Not on file  . Stress: Not on file  Relationships  . Social connections:    Talks on phone: Not on file    Gets together: Not on file    Attends religious service: Not on file    Active member of club or organization: Not on file    Attends meetings of clubs or organizations: Not on file    Relationship status: Not on file  Other Topics Concern  . Not on file  Social History Narrative  . Not on file    Family History  Problem Relation Age of Onset  . Hypertension Mother   . Diabetes Mother   . Cancer Mother        BREAST  . Stroke  Mother   . Seizures Mother   . Asthma Daughter   . Hypertension Maternal Grandmother   . Diabetes Maternal Grandmother   . Cancer Maternal Grandmother        BONE CANCER  . Other Neg Hx     Medications Prior to Admission  Medication Sig Dispense Refill Last Dose  . oxyCODONE-acetaminophen (PERCOCET/ROXICET) 5-325 MG tablet Take 1 tablet by mouth every 6 (six) hours as needed for severe pain. 20 tablet 0   . promethazine (PHENERGAN) 25 MG tablet Take 1 tablet (25 mg total) by mouth every 6 (six) hours as needed for nausea or vomiting. (Patient not taking: Reported on 09/19/2017) 30 tablet 0 Not Taking at Unknown time    No Known Allergies  Review of Systems: Negative except for what is mentioned in HPI.  Physical Exam: BP 114/60 (BP Location: Right Arm)   Pulse 85   Temp 98.2 F (36.8 C) (Oral)   Resp 18   Ht 5\' 6"  (1.676 m)   Wt 236 lb 8 oz (107.3 kg)   SpO2 99%   BMI 38.17 kg/m  CONSTITUTIONAL: Well-developed, well-nourished female in no acute distress. Appears fatigued  HENT:  Normocephalic, atraumatic, External right and left ear normal. Oropharynx is clear and moist EYES: Conjunctivae and EOM are normal. Pupils are equal, round, and reactive to light. No scleral icterus.  NECK: Normal range of motion, supple, no masses SKIN: Skin is warm and dry. No rash noted. Not diaphoretic. No erythema. No pallor. NEUROLOGIC: Alert and oriented to person, place, and time. Normal reflexes, muscle tone coordination. No cranial nerve deficit noted. PSYCHIATRIC: Normal mood and affect. Normal behavior. Normal judgment and thought content. CARDIOVASCULAR: Normal heart rate noted, regular rhythm RESPIRATORY: Effort and breath sounds normal, no problems with respiration noted ABDOMEN: Soft, moderate to significantly tender in lower quadrants, R>L MUSCULOSKELETAL: Normal range of motion. No edema and no tenderness. 2+ distal pulses.    Pertinent Labs/Studies:    Assessment : Chloe Rodgers is a 27 y.o. [phone] s/p recent miscarriage being admitted for worsening abdominal pain with significant CMT, recent h/o chlamydia and 2 courses outpatient  antibiotic regimen for [facility]. Suspect failed outpatient treatment for PID and recommended admission for IV antibiotics. Differential also includes early appendicitis. Course complicated by HCG of 56 today, suspect this is from SAB on 09/09/17 but as patient has been having unprotected intercourse, may be new pregnancy. Reviewed the above with patient and she is agreeable for admission for IV antibiotics. Will monitor closely for changes in symptoms.   Mefoxitin, azithromycin Regular diet Activity as tolerated Saline lock SCDs    K. Therese Sarah, M.D. Attending Obstetrician & Gynecologist, [facility] for [employer], [facility]  09/30/2017, 12:32 AM

## 2017-09-29 NOTE — MAU Note (Signed)
Having really bad cramping at the bottom of her stomach.  Started yesterday. had spotting, started 3 days ago.recent miscarriage, earler March.  Has f/u appt tomorrow.  Yesterday went to get implanon placed - they did a test and it was positive.

## 2017-09-29 NOTE — MAU Note (Signed)
Dr. Earlene Plateravis, and Rasch, CNM at bedside to discuss POC. Pt. Signed AMA form. Patient desires to go home to make arrangements for her 505 month old before she can stay overnight in hospital.  Dr. Earlene Plateravis and Rasch notified.  Pt. Will be direct admit upon return, direct admit to [facility], 3rd floor.

## 2017-09-30 ENCOUNTER — Encounter: Payer: Self-pay | Admitting: Obstetrics & Gynecology

## 2017-09-30 DIAGNOSIS — R109 Unspecified abdominal pain: Secondary | ICD-10-CM | POA: Diagnosis present

## 2017-09-30 DIAGNOSIS — O035 Genital tract and pelvic infection following complete or unspecified spontaneous abortion: Secondary | ICD-10-CM | POA: Diagnosis not present

## 2017-09-30 DIAGNOSIS — N739 Female pelvic inflammatory disease, unspecified: Secondary | ICD-10-CM | POA: Diagnosis not present

## 2017-09-30 MED ORDER — FLUCONAZOLE 150 MG PO TABS
150.0000 mg | ORAL_TABLET | ORAL | Status: DC
  Administered 2017-10-01: 150 mg via ORAL
  Filled 2017-09-30: qty 1

## 2017-09-30 MED ORDER — OXYCODONE-ACETAMINOPHEN 5-325 MG PO TABS
1.0000 | ORAL_TABLET | ORAL | Status: DC | PRN
  Administered 2017-09-30 – 2017-10-02 (×10): 1 via ORAL
  Filled 2017-09-30 (×10): qty 1

## 2017-09-30 NOTE — Plan of Care (Signed)
  Problem: Education: Goal: Knowledge of General Education information will improve Outcome: Progressing   Problem: Health Behavior/Discharge Planning: Goal: Ability to manage health-related needs will improve Outcome: Progressing   Problem: Clinical Measurements: Goal: Respiratory complications will improve Outcome: Progressing   Problem: Activity: Goal: Risk for activity intolerance will decrease Outcome: Progressing   Problem: Nutrition: Goal: Adequate nutrition will be maintained Outcome: Progressing

## 2017-09-30 NOTE — Progress Notes (Signed)
Subjective: Patient reports some improvement in her pain but still present. She denies nausea, emesis  Objective: I have reviewed patient's vital signs, medications and labs.  Blood pressure 124/73, pulse 76, temperature 98.3 F (36.8 C), temperature source Oral, resp. rate 18, height 5\' 6"  (1.676 m), weight 236 lb 8 oz (107.3 kg), SpO2 100 %, unknown if currently breastfeeding.  General: alert, cooperative and no distress Resp: clear to auscultation bilaterally Cardio: regular rate and rhythm GI: soft, non-tender; bowel sounds normal; no masses,  no organomegaly Extremities: extremities normal, atraumatic, no cyanosis or edema and Homans sign is negative, no sign of DVT   Assessment/Plan: 27 yo with PID and failed outpatient management - Continue IV antibiotics - will obtain quant HCG tomorrow - Continue current care   LOS: 0 days    Deaunte Dente 09/30/2017, 11:33 AM

## 2017-10-01 ENCOUNTER — Encounter (HOSPITAL_COMMUNITY): Payer: Self-pay | Admitting: *Deleted

## 2017-10-01 LAB — HCG, QUANTITATIVE, PREGNANCY: hCG, Beta Chain, Quant, S: 42 m[IU]/mL — ABNORMAL HIGH (ref <5)

## 2017-10-01 MED ORDER — SODIUM CHLORIDE 0.9 % IV SOLN
100.0000 mg | Freq: Two times a day (BID) | INTRAVENOUS | Status: DC
  Administered 2017-10-01 (×2): 100 mg via INTRAVENOUS
  Filled 2017-10-01 (×3): qty 100

## 2017-10-01 MED ORDER — PROMETHAZINE HCL 25 MG/ML IJ SOLN
25.0000 mg | Freq: Four times a day (QID) | INTRAMUSCULAR | Status: DC | PRN
  Administered 2017-10-01: 25 mg via INTRAVENOUS
  Filled 2017-10-01: qty 1

## 2017-10-01 MED ORDER — IBUPROFEN 600 MG PO TABS
600.0000 mg | ORAL_TABLET | Freq: Four times a day (QID) | ORAL | Status: DC
Start: 2017-10-01 — End: 2017-10-02
  Administered 2017-10-01 – 2017-10-02 (×4): 600 mg via ORAL
  Filled 2017-10-01 (×4): qty 1
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
## 2017-10-01 NOTE — Progress Notes (Signed)
Subjective: Patient reports some improvement in her pain but it returns without pain medication.     Objective: I have reviewed patient's vital signs, medications and labs. Blood pressure (!) 105/59, pulse 70, temperature 97.8 F (36.6 C), resp. rate 16, height 5\' 6"  (1.676 m), weight 236 lb 8 oz (107.3 kg), SpO2 100 %, unknown if currently breastfeeding.  General: alert, cooperative and no distress Resp: clear to auscultation bilaterally Cardio: regular rate and rhythm GI: soft, non distended, lower abdominal tenderness, no rebound, no guarding Extremities: extremities normal, atraumatic, no cyanosis or edema   Assessment/Plan: 27 yo with failed outpatient treatment for PID - Continue IV antibiotics - Pain management prn - Quant HCG decreased from 57 to 42 over 48 hours, likely related to 3/11 miscarriage - Doxycycline added to antibiotic regimen - Continue current care with plans for discharge tomorrow   LOS: 1 day    Burgundy Matuszak 10/01/2017, 9:05 AM

## 2017-10-02 MED ORDER — PRENATAL MULTIVITAMIN CH: 1.0000 | ORAL_TABLET | Freq: Every day | ORAL | 0 refills | Status: DC

## 2017-10-02 MED ORDER — DOCUSATE SODIUM 100 MG PO CAPS: 100.0000 mg | ORAL_CAPSULE | Freq: Two times a day (BID) | ORAL | 0 refills | Status: DC

## 2017-10-02 MED ORDER — FLUCONAZOLE 150 MG PO TABS: 150.0000 mg | ORAL_TABLET | ORAL | 1 refills | Status: DC

## 2017-10-02 MED ORDER — IBUPROFEN 600 MG PO TABS: 600.0000 mg | ORAL_TABLET | Freq: Four times a day (QID) | ORAL | 3 refills | Status: DC

## 2017-10-02 MED ORDER — OXYCODONE-ACETAMINOPHEN 5-325 MG PO TABS: 1.0000 | ORAL_TABLET | ORAL | 0 refills | Status: DC | PRN

## 2017-10-02 MED ORDER — DOXYCYCLINE HYCLATE 100 MG PO CAPS: 100.0000 mg | ORAL_CAPSULE | Freq: Two times a day (BID) | ORAL | 0 refills | Status: DC

## 2017-10-02 NOTE — Progress Notes (Signed)

## 2017-10-02 NOTE — Discharge Instructions (Signed)
Pelvic Inflammatory Disease Pelvic inflammatory disease (PID) refers to an infection in some or all of the female organs. The infection can be in the uterus, ovaries, fallopian tubes, or the surrounding tissues in the pelvis. PID can cause abdominal or pelvic pain that comes on suddenly (acute pelvic pain). PID is a serious infection because it can lead to lasting (chronic) pelvic pain or the inability to have children (infertility). What are the causes? This condition is most often caused by an infection that is spread during sexual contact. However, the infection can also be caused by the normal bacteria that are found in the vaginal tissues if these bacteria travel upward into the reproductive organs. PID can also occur following:  The birth of a baby.  A miscarriage.  An abortion.  Major pelvic surgery.  The use of an intrauterine device (IUD).  A sexual assault.  What increases the risk? This condition is more likely to develop in women who:  Are younger than 27 years of age.  Are sexually active at [facility] age.  Use nonbarrier contraception.  Have multiple sexual partners.  Have sex with someone who has symptoms of an STD (sexually transmitted disease).  Use oral contraception.  At times, certain behaviors can also increase the possibility of getting PID, such as:  Using a vaginal douche.  Having an IUD in place.  What are the signs or symptoms? Symptoms of this condition include:  Abdominal or pelvic pain.  Fever.  Chills.  Abnormal vaginal discharge.  Abnormal uterine bleeding.  Unusual pain shortly after the end of a menstrual period.  Painful urination.  Pain with sexual intercourse.  Nausea and vomiting.  How is this diagnosed? To diagnose this condition, your health care provider will do a physical exam and take your medical history. A pelvic exam typically reveals great tenderness in the uterus and the surrounding pelvic tissues. You may also  have tests, such as:  Lab tests, including a pregnancy test, blood tests, and urine test.  Culture tests of the vagina and cervix to check for an STD.  Ultrasound.  A laparoscopic procedure to look inside the pelvis.  Examining vaginal secretions under a microscope.  How is this treated? Treatment for this condition may involve one or more approaches.  Antibiotic medicines may be prescribed to be taken by mouth.  Sexual partners may need to be treated if the infection is caused by an STD.  For more severe cases, hospitalization may be needed to give antibiotics directly into a vein through an IV tube.  Surgery may be needed if other treatments do not help, but this is rare.  It may take weeks until you are completely well. If you are diagnosed with PID, you should also be checked for human immunodeficiency virus (HIV). Your health care provider may test you for infection again 3 months after treatment. You should not have unprotected sex. Follow these instructions at home:  Take over-the-counter and prescription medicines only as told by your health care provider.  If you were prescribed an antibiotic medicine, take it as told by your health care provider. Do not stop taking the antibiotic even if you start to feel better.  Do not have sexual intercourse until treatment is completed or as told by your health care provider. If PID is confirmed, your recent sexual partners will need treatment, especially if you had unprotected sex.  Keep all follow-up visits as told by your health care provider. This is important. Contact a health care  provider if:  You have increased or abnormal vaginal discharge.  Your pain does not improve.  You vomit.  You have a fever.  You cannot tolerate your medicines.  Your partner has an STD.  You have pain when you urinate. Get help right away if:  You have increased abdominal or pelvic pain.  You have chills.  Your symptoms are not  better in 72 hours even with treatment. This information is not intended to replace advice given to you by your health care provider. Make sure you discuss any questions you have with your health care provider. Document Released: 06/28/2005 Document Revised: 12/04/2015 Document Reviewed: 08/05/2014 Elsevier Interactive Patient Education  2018 [employer]    Contraception Choices Contraception, also called birth control, refers to methods or devices that prevent pregnancy. Hormonal methods Contraceptive implant A contraceptive implant is a thin, plastic tube that contains a hormone. It is inserted into the upper part of the arm. It can remain in place for up to 3 years. Progestin-only injections Progestin-only injections are injections of progestin, a synthetic form of the hormone progesterone. They are given every 3 months by a health care provider. Birth control pills Birth control pills are pills that contain hormones that prevent pregnancy. They must be taken once a day, preferably at the same time each day. Birth control patch The birth control patch contains hormones that prevent pregnancy. It is placed on the skin and must be changed once a week for three weeks and removed on the fourth week. A prescription is needed to use this method of contraception. Vaginal ring A vaginal ring contains hormones that prevent pregnancy. It is placed in the vagina for three weeks and removed on the fourth week. After that, the process is repeated with a new ring. A prescription is needed to use this method of contraception. Emergency contraceptive Emergency contraceptives prevent pregnancy after unprotected sex. They come in pill form and can be taken up to 5 days after sex. They work best the sooner they are taken after having sex. Most emergency contraceptives are available without a prescription. This method should not be used as your only form of birth control. Barrier methods Female condom A female  condom is a thin sheath that is worn over the penis during sex. Condoms keep sperm from going inside a woman's body. They can be used with a spermicide to increase their effectiveness. They should be disposed after a single use. Female condom A female condom is a soft, loose-fitting sheath that is put into the vagina before sex. The condom keeps sperm from going inside a woman's body. They should be disposed after a single use. Diaphragm A diaphragm is a soft, dome-shaped barrier. It is inserted into the vagina before sex, along with a spermicide. The diaphragm blocks sperm from entering the uterus, and the spermicide kills sperm. A diaphragm should be left in the vagina for 6-8 hours after sex and removed within 24 hours. A diaphragm is prescribed and fitted by a health care provider. A diaphragm should be replaced every 1-2 years, after giving birth, after gaining more than 15 lb (6.8 kg), and after pelvic surgery. Cervical cap A cervical cap is a round, soft latex or plastic cup that fits over the cervix. It is inserted into the vagina before sex, along with spermicide. It blocks sperm from entering the uterus. The cap should be left in place for 6-8 hours after sex and removed within 48 hours. A cervical cap must be prescribed  and fitted by a health care provider. It should be replaced every 2 years. Sponge A sponge is a soft, circular piece of polyurethane foam with spermicide on it. The sponge helps block sperm from entering the uterus, and the spermicide kills sperm. To use it, you make it wet and then insert it into the vagina. It should be inserted before sex, left in for at least 6 hours after sex, and removed and thrown away within 30 hours. Spermicides Spermicides are chemicals that kill or block sperm from entering the cervix and uterus. They can come as a cream, jelly, suppository, foam, or tablet. A spermicide should be inserted into the vagina with an applicator at least 10-15 minutes  before sex to allow time for it to work. The process must be repeated every time you have sex. Spermicides do not require a prescription. Intrauterine contraception Intrauterine device (IUD) An IUD is a T-shaped device that is put in a woman's uterus. There are two types:  Hormone IUD.This type contains progestin, a synthetic form of the hormone progesterone. This type can stay in place for 3-5 years.  Copper IUD.This type is wrapped in copper wire. It can stay in place for 10 years.  Permanent methods of contraception Female tubal ligation In this method, a woman's fallopian tubes are sealed, tied, or blocked during surgery to prevent eggs from traveling to the uterus. Hysteroscopic sterilization In this method, a small, flexible insert is placed into each fallopian tube. The inserts cause scar tissue to form in the fallopian tubes and block them, so sperm cannot reach an egg. The procedure takes about 3 months to be effective. Another form of birth control must be used during those 3 months. Female sterilization This is a procedure to tie off the tubes that carry sperm (vasectomy). After the procedure, the man can still ejaculate fluid (semen). Natural planning methods Natural family planning In this method, a couple does not have sex on days when the woman could become pregnant. Calendar method This means keeping track of the length of each menstrual cycle, identifying the days when pregnancy can happen, and not having sex on those days. Ovulation method In this method, a couple avoids sex during ovulation. Symptothermal method This method involves not having sex during ovulation. The woman typically checks for ovulation by watching changes in her temperature and in the consistency of cervical mucus. Post-ovulation method In this method, a couple waits to have sex until after ovulation. Summary  Contraception, also called birth control, means methods or devices that prevent  pregnancy.  Hormonal methods of contraception include implants, injections, pills, patches, vaginal rings, and emergency contraceptives.  Barrier methods of contraception can include female condoms, female condoms, diaphragms, cervical caps, sponges, and spermicides.  There are two types of IUDs (intrauterine devices). An IUD can be put in a woman's uterus to prevent pregnancy for 3-5 years.  Permanent sterilization can be done through a procedure for males, females, or both.  Natural family planning methods involve not having sex on days when the woman could become pregnant. This information is not intended to replace advice given to you by your health care provider. Make sure you discuss any questions you have with your health care provider. Document Released: 06/28/2005 Document Revised: 07/31/2016 Document Reviewed: 07/31/2016 Elsevier Interactive Patient Education  2018 [employer].

## 2017-10-02 NOTE — Discharge Summary (Signed)
OB Discharge Summary     Patient Name: Chloe NapoleonShakilla XXXREYES DOB: August 19, 1990 MRN: [id number]  Date of admission: 09/29/2017  Date of discharge: 10/02/2017  Admitting diagnosis: PID Secondary diagnosis:  Active Problems:   Pelvic inflammatory disease  Additional problems: s/p spontaneous miscarriage     Discharge diagnosis: PID                                   Hospital course:  Patient admitted for in-patient treatment of PID. Patient failed 2 outpatient treatment for the same condition and presented to emergency room with complaints of abdominal pain and fever at home. Patient received IV antibiotics and pain management. She remained afebrile for over 48 hours. She was found stable for discharge to complete a 14-day course of doxycycline. Patient diagnosed with miscarriage on 3/11 with quant HCG continuing to decline. Discharge precautions reviewed. Patient will be scheduled to follow up in 2 weeks   Physical exam  Vitals:   10/01/17 1552 10/01/17 1955 10/01/17 2351 10/02/17 0500  BP: (!) 111/53 (!) 100/51 (!) 103/58 (!) 108/55  Pulse: 80 78 85 67  Resp: 16 17 17 18   Temp: 98.1 F (36.7 C) 98.4 F (36.9 C) 98.2 F (36.8 C) 98 F (36.7 C)  TempSrc: Oral Oral    SpO2: 98% 100% 99% 100%  Weight:      Height:       General: alert, cooperative and no distress Abdomen: soft, mildly tender, non distended. No rebound, no guarding DVT Evaluation: No evidence of DVT seen on physical exam. Labs: Lab Results  Component Value Date   WBC 6.7 09/29/2017   HGB 11.8 (L) 09/29/2017   HCT 35.5 (L) 09/29/2017   MCV 81.4 09/29/2017   PLT 191 09/29/2017   CMP Latest Ref Rng & Units 09/29/2017  Glucose 65 - 99 mg/dL 82  BUN 6 - 20 mg/dL 15  Creatinine 4.090.44 - 8.111.00 mg/dL 9.140.72  Sodium 782135 - 956145 mmol/L 136  Potassium 3.5 - 5.1 mmol/L 4.0  Chloride 101 - 111 mmol/L 105  CO2 22 - 32 mmol/L 23  Calcium 8.9 - 10.3 mg/dL 2.1(H8.8(L)  Total Protein 6.5 - 8.1 g/dL 7.0  Total Bilirubin 0.3 - 1.2 mg/dL  0.3  Alkaline Phos 38 - 126 U/L 81  AST 15 - 41 U/L 15  ALT 14 - 54 U/L 18    Discharge instruction: per After Visit Summary and "Baby and Me Booklet".  After visit meds:  Allergies as of 10/02/2017   No Known Allergies     Medication List    TAKE these medications   docusate sodium 100 MG capsule Commonly known as:  COLACE Take 1 capsule (100 mg total) by mouth 2 (two) times daily.   doxycycline 100 MG capsule Commonly known as:  VIBRAMYCIN Take 1 capsule (100 mg total) by mouth 2 (two) times daily.   fluconazole 150 MG tablet Commonly known as:  DIFLUCAN Take 1 tablet (150 mg total) by mouth every 3 (three) days. Start taking on:  10/04/2017   ibuprofen 600 MG tablet Commonly known as:  ADVIL,MOTRIN Take 1 tablet (600 mg total) by mouth every 6 (six) hours.   oxyCODONE-acetaminophen 5-325 MG tablet Commonly known as:  PERCOCET/ROXICET Take 1 tablet by mouth every 4 (four) hours as needed for severe pain.   prenatal multivitamin Tabs tablet Take 1 tablet by mouth daily at 12 noon.   promethazine  25 MG tablet Commonly known as:  PHENERGAN Take 1 tablet (25 mg total) by mouth every 6 (six) hours as needed for nausea or vomiting.       Diet: routine diet  Activity: Advance as tolerated. Pelvic rest for 6 weeks.   Outpatient follow up: will be contacted with appointment for follow up, repeat quant and contraception counseling. Patient interested in Nexplanon  Follow up Appt:No future appointments. Follow up Visit:No follow-ups on file.    10/02/2017 Catalina Antigua, MD

## 2017-10-05 ENCOUNTER — Encounter: Payer: Self-pay | Admitting: Obstetrics and Gynecology

## 2017-10-14 ENCOUNTER — Encounter (INDEPENDENT_AMBULATORY_CARE_PROVIDER_SITE_OTHER): Payer: Self-pay

## 2017-10-17 ENCOUNTER — Encounter (HOSPITAL_COMMUNITY): Payer: Self-pay | Admitting: *Deleted

## 2017-10-17 ENCOUNTER — Inpatient Hospital Stay (HOSPITAL_COMMUNITY)
Admission: AD | Admit: 2017-10-17 | Discharge: 2017-10-18 | Disposition: A | Discharge status: Home / Self Care | Payer: Medicaid Other | Source: Ambulatory Visit | Attending: Obstetrics & Gynecology | Admitting: Obstetrics & Gynecology

## 2017-10-17 DIAGNOSIS — R102 Pelvic and perineal pain: Secondary | ICD-10-CM | POA: Diagnosis not present

## 2017-10-17 DIAGNOSIS — E876 Hypokalemia: Secondary | ICD-10-CM | POA: Diagnosis not present

## 2017-10-17 DIAGNOSIS — R1084 Generalized abdominal pain: Secondary | ICD-10-CM | POA: Diagnosis not present

## 2017-10-17 DIAGNOSIS — R109 Unspecified abdominal pain: Secondary | ICD-10-CM | POA: Diagnosis not present

## 2017-10-17 LAB — POCT PREGNANCY, URINE: Preg Test, Ur: NEGATIVE

## 2017-10-17 LAB — URINALYSIS, ROUTINE W REFLEX MICROSCOPIC
Bilirubin Urine: NEGATIVE
Glucose, UA: NEGATIVE mg/dL
Ketones, ur: NEGATIVE mg/dL
Leukocytes, UA: NEGATIVE
Nitrite: NEGATIVE
Protein, ur: NEGATIVE mg/dL
Specific Gravity, Urine: 1.025 (ref 1.005–1.030)
pH: 6 (ref 5.0–8.0)

## 2017-10-17 MED ORDER — KETOROLAC TROMETHAMINE 60 MG/2ML IM SOLN
60.0000 mg | Freq: Once | INTRAMUSCULAR | Status: AC
  Administered 2017-10-18: 60 mg via INTRAMUSCULAR
  Filled 2017-10-17: qty 2

## 2017-10-17 NOTE — MAU Note (Signed)
Presents to MAU stating she has abdominal pain similar to admission x 2 weeks ago.  + burning with urination, and n/occasional vomiting

## 2017-10-17 NOTE — MAU Provider Note (Signed)
History  CSN: [id number] Arrival date and time: 10/17/17 2024  None     Chief Complaint  Patient presents with  . Abdominal Pain    HPI: Chloe Rodgers is a 27 y.o. [id number] who presents to maternity admissions reporting worsening abdominal pain. She was recently admitted for inpatient management of PID after failed outpatient treatment and discharged home 2 weeks ago on po antibiotics. Pt reports her pain never improved, and over the last 2 days pain got worse, and she has not able to tolerate last few pill of antibiotics. Reports po intake is decreased d/t N/V. Also reports fever at home yesterday, but no today. Denies diarrhea, last BM was yesterday. Pain is mostly on suprapubic region and RLQ. Denies dysuria or hematuria, but reports that she started having vaginal bleeding today, and has some constant burning in vaginal region. Denies any lesions.    OB History  Gravida Para Term Preterm AB Living  5 3 3  0 2 3  SAB TAB Ectopic Multiple Live Births  0 1 0 0 3    # Outcome Date GA Lbr Len/2nd Weight Sex Delivery Anes PTL Lv  5 AB 09/13/17 [redacted]w[redacted]d         4 Term 03/30/17 [redacted]w[redacted]d  6 lb 13 oz (3.09 kg) M CS-Vac Spinal  LIV  3 TAB 2015             Birth Comments: System Generated. Please review and update pregnancy details.  2 Term 06/06/12 [redacted]w[redacted]d  7 lb 15.9 oz (3.625 kg) F CS-LTranv Spinal  LIV  1 Term 02/25/11 [redacted]w[redacted]d  8 lb 14 oz (4.026 kg) F CS-LTranv EPI N LIV     Birth Comments: c/s  due to "BP dropped"   Past Medical History:  Diagnosis Date  . Anemia   . Anxiety   . Chlamydia   . Complication of anesthesia    ??, seizure with wisdom teeth  . Depression    not on meds, sees a therapist  . Infection    UTI  . Kidney stone   . Migraine   . PID (acute pelvic inflammatory disease) 2014  . PONV (postoperative nausea and vomiting)   . Psoriasis   . Seizures [facility])    July 2013 - [place]   Past Surgical History:  Procedure Laterality Date  . CESAREAN SECTION    . CESAREAN  SECTION  06/06/2012   Procedure: CESAREAN SECTION;  Surgeon: Adam Phenix, MD;  Location: [facility];  Service: Obstetrics;  Laterality: N/A;  . CESAREAN SECTION N/A 03/30/2017   Procedure: REPEAT CESAREAN SECTION;  Surgeon: Lazaro Arms, MD;  Location: [facility] BIRTHING SUITES;  Service: Obstetrics;  Laterality: N/A;  . SKIN GRAFT     off abd, onto arm  . WISDOM TOOTH EXTRACTION     Social History   Socioeconomic History  . Marital status: Single    Spouse name: Not on file  . Number of children: Not on file  . Years of education: Not on file  . Highest education level: Not on file  Occupational History  . Not on file  Social Needs  . Financial resource strain: Not on file  . Food insecurity:    Worry: Not on file    Inability: Not on file  . Transportation needs:    Medical: Not on file    Non-medical: Not on file  Tobacco Use  . Smoking status: Never Smoker  . Smokeless tobacco: Never Used  Substance and Sexual Activity  .  Alcohol use: No    Comment: socially but none with pregnancy  . Drug use: No  . Sexual activity: Yes    Birth control/protection: None  Lifestyle  . Physical activity:    Days per week: Not on file    Minutes per session: Not on file  . Stress: Not on file  Relationships  . Social connections:    Talks on phone: Not on file    Gets together: Not on file    Attends religious service: Not on file    Active member of club or organization: Not on file    Attends meetings of clubs or organizations: Not on file    Relationship status: Not on file  . Intimate partner violence:    Fear of current or ex partner: Not on file    Emotionally abused: Not on file    Physically abused: Not on file    Forced sexual activity: Not on file  Other Topics Concern  . Not on file  Social History Narrative  . Not on file   No Known Allergies  Medications Prior to Admission  Medication Sig Dispense Refill Last Dose  . docusate sodium (COLACE) 100 MG capsule Take 1  capsule (100 mg total) by mouth 2 (two) times daily. 10 capsule 0   . doxycycline (VIBRAMYCIN) 100 MG capsule Take 1 capsule (100 mg total) by mouth 2 (two) times daily. 28 capsule 0   . fluconazole (DIFLUCAN) 150 MG tablet Take 1 tablet (150 mg total) by mouth every 3 (three) days. 2 tablet 1   . ibuprofen (ADVIL,MOTRIN) 600 MG tablet Take 1 tablet (600 mg total) by mouth every 6 (six) hours. 30 tablet 3   . oxyCODONE-acetaminophen (PERCOCET/ROXICET) 5-325 MG tablet Take 1 tablet by mouth every 4 (four) hours as needed for severe pain. 20 tablet 0   . Prenatal Vit-Fe Fumarate-FA (PRENATAL MULTIVITAMIN) TABS tablet Take 1 tablet by mouth daily at 12 noon. 30 tablet 0   . promethazine (PHENERGAN) 25 MG tablet Take 1 tablet (25 mg total) by mouth every 6 (six) hours as needed for nausea or vomiting. (Patient not taking: Reported on 09/19/2017) 30 tablet 0 Not Taking at Unknown time    I have reviewed patient's Past Medical Hx, Surgical Hx, Family Hx, Social Hx, medications and allergies.   Review of Systems: Negative except for what is mentioned in HPI.  Physical Exam   Blood pressure 129/68, pulse 72, temperature 98.3 F (36.8 C), temperature source Oral, resp. rate 19, unknown if currently breastfeeding.  Constitutional: Well-developed, well-nourished female in no acute distress.  HENT: [place]/AT, normal oropharynx mucosa. MMM Eyes: normal conjunctivae, no scleral icterus Cardiovascular: normal rate, regular rhythm Respiratory: normal effort, lungs CTAB.  GI: Abd soft, non-tender, non-distended, normoactive bowel sounds GU: Neg CVAT. Pelvic: NEFG. Normal vaginal mucosa without lesions; cervix pink, visually closed, without lesion; scant vaginal discharge. Patient with general discomfort with pelvic exam, but mostly suprapubic tenderness. Exam limited by body habitus. MSK: Extremities nontender, no edema Neurologic: Alert and oriented x 4. Psych: Normal mood and affect Skin: warm and dry     MAU Course/MDM:   Nursing notes and VS reviewed. Her VS are stable, afebrile.  Patient seen and examined, as noted above.   UPT neg UA is unremarkable, but will send for culture.  CBC, CMP, lipase ordered. Toradol ordered for pain.   CBC shows normal WBCs. CMP and lipase normal other than mild hypokalemia.  KDUR 40 mEq given.  Pt overall feeling better.   Assessment and Plan  Assessment: 1. Generalized abdominal pain   2. Hypokalemia   3. Pelvic pain    27 y.o. with recent PID presenting with worsening pain. She started having vaginal bleeding today. UPT negative. Labs here reassuring and not suggestive of an acute process. Discussed ddx with patient, including menstrual pain given bleeding starting today.   Plan: --Discharge home in stable condition --Rx: ibuprofen 600 mg prn  --Follow up gyn appt as scheduled  Manaal Mandala, Kandra Nicolas, MD 10/17/2017 11:17 PM     Future Appointments  Date Time Provider Department Center  10/18/2017 10:55 AM Constant, Gigi Gin, MD [facility]

## 2017-10-18 ENCOUNTER — Encounter: Payer: Self-pay | Admitting: Obstetrics and Gynecology

## 2017-10-18 ENCOUNTER — Ambulatory Visit (INDEPENDENT_AMBULATORY_CARE_PROVIDER_SITE_OTHER): Payer: Medicaid Other | Admitting: Obstetrics and Gynecology

## 2017-10-18 VITALS — BP 109/70 | HR 77 | Ht 66.0 in | Wt 234.8 lb

## 2017-10-18 DIAGNOSIS — Z3042 Encounter for surveillance of injectable contraceptive: Secondary | ICD-10-CM | POA: Diagnosis not present

## 2017-10-18 DIAGNOSIS — Z30013 Encounter for initial prescription of injectable contraceptive: Secondary | ICD-10-CM

## 2017-10-18 DIAGNOSIS — N739 Female pelvic inflammatory disease, unspecified: Secondary | ICD-10-CM

## 2017-10-18 DIAGNOSIS — R1084 Generalized abdominal pain: Secondary | ICD-10-CM | POA: Diagnosis not present

## 2017-10-18 DIAGNOSIS — Z09 Encounter for follow-up examination after completed treatment for conditions other than malignant neoplasm: Secondary | ICD-10-CM

## 2017-10-18 LAB — CBC WITH DIFFERENTIAL/PLATELET
Basophils Absolute: 0 10*3/uL (ref 0.0–0.1)
Basophils Relative: 0 %
Eosinophils Absolute: 0.2 10*3/uL (ref 0.0–0.7)
Eosinophils Relative: 3 %
HCT: 34.7 % — ABNORMAL LOW (ref 36.0–46.0)
Hemoglobin: 11.5 g/dL — ABNORMAL LOW (ref 12.0–15.0)
Lymphocytes Relative: 27 %
Lymphs Abs: 1.9 10*3/uL (ref 0.7–4.0)
MCH: 26.9 pg (ref 26.0–34.0)
MCHC: 33.1 g/dL (ref 30.0–36.0)
MCV: 81.3 fL (ref 78.0–100.0)
Monocytes Absolute: 0.4 10*3/uL (ref 0.1–1.0)
Monocytes Relative: 6 %
Neutro Abs: 4.4 10*3/uL (ref 1.7–7.7)
Neutrophils Relative %: 64 %
Platelets: 175 10*3/uL (ref 150–400)
RBC: 4.27 MIL/uL (ref 3.87–5.11)
RDW: 14.2 % (ref 11.5–15.5)
WBC: 6.9 10*3/uL (ref 4.0–10.5)

## 2017-10-18 LAB — COMPREHENSIVE METABOLIC PANEL
ALT: 11 U/L — ABNORMAL LOW (ref 14–54)
AST: 15 U/L (ref 15–41)
Albumin: 3.4 g/dL — ABNORMAL LOW (ref 3.5–5.0)
Alkaline Phosphatase: 66 U/L (ref 38–126)
Anion gap: 9 (ref 5–15)
BUN: 12 mg/dL (ref 6–20)
CO2: 22 mmol/L (ref 22–32)
Calcium: 8.7 mg/dL — ABNORMAL LOW (ref 8.9–10.3)
Chloride: 106 mmol/L (ref 101–111)
Creatinine, Ser: 0.72 mg/dL (ref 0.44–1.00)
GFR calc Af Amer: >60 mL/min (ref >60)
GFR calc non Af Amer: >60 mL/min (ref >60)
Glucose, Bld: 90 mg/dL (ref 65–99)
Potassium: 3.4 mmol/L — ABNORMAL LOW (ref 3.5–5.1)
Sodium: 137 mmol/L (ref 135–145)
Total Bilirubin: 0.3 mg/dL (ref 0.3–1.2)
Total Protein: 6.7 g/dL (ref 6.5–8.1)

## 2017-10-18 LAB — LIPASE, BLOOD: Lipase: 20 U/L (ref 11–51)

## 2017-10-18 LAB — WET PREP, GENITAL
Clue Cells Wet Prep HPF POC: NONE SEEN
Sperm: NONE SEEN
Trich, Wet Prep: NONE SEEN
Yeast Wet Prep HPF POC: NONE SEEN

## 2017-10-18 LAB — POCT PREGNANCY, URINE: Preg Test, Ur: NEGATIVE

## 2017-10-18 MED ORDER — MEDROXYPROGESTERONE ACETATE 150 MG/ML IM SUSP
150.0000 mg | Freq: Once | INTRAMUSCULAR | Status: AC
  Administered 2017-10-18: 150 mg via INTRAMUSCULAR

## 2017-10-18 MED ORDER — LACTATED RINGERS IV SOLN
INTRAVENOUS | Status: DC
  Administered 2017-10-18: via INTRAVENOUS

## 2017-10-18 MED ORDER — POTASSIUM CHLORIDE CRYS ER 20 MEQ PO TBCR
40.0000 meq | EXTENDED_RELEASE_TABLET | Freq: Once | ORAL | Status: DC
  Filled 2017-10-18: qty 2

## 2017-10-18 MED ORDER — IBUPROFEN 600 MG PO TABS: 600.0000 mg | ORAL_TABLET | Freq: Four times a day (QID) | ORAL | 0 refills | Status: DC | PRN

## 2017-10-18 NOTE — Progress Notes (Signed)
27 yo [id number] here for follow up. Patient discharged at the end of March with diagnosis of PID after failed outpatient management. Patient reports persistent bilateral cramping pain. She started her periods which increased the intensity of her pain. She was seen in MAU on 10/17/2017 with normal lab findings including UA and wet prep. Patient is sexually active and desires depo-provera for contraception  Past Medical History:  Diagnosis Date  . Anemia   . Anxiety   . Chlamydia   . Complication of anesthesia    ??, seizure with wisdom teeth  . Depression    not on meds, sees a therapist  . Infection    UTI  . Kidney stone   . Migraine   . PID (acute pelvic inflammatory disease) 2014  . PONV (postoperative nausea and vomiting)   . Psoriasis   . Seizures [facility])    [place]   Past Surgical History:  Procedure Laterality Date  . CESAREAN SECTION    . CESAREAN SECTION  06/06/2012   Procedure: CESAREAN SECTION;  Surgeon: Adam PhenixJames G Arnold, MD;  Location: [facility];  Service: Obstetrics;  Laterality: N/A;  . CESAREAN SECTION N/A 03/30/2017   Procedure: REPEAT CESAREAN SECTION;  Surgeon: Lazaro ArmsEure, Luther H, MD;  [facility];  Service: Obstetrics;  Laterality: N/A;  . SKIN GRAFT     off abd, onto arm  . WISDOM TOOTH EXTRACTION     Family History  Problem Relation Age of Onset  . Hypertension Mother   . Diabetes Mother   . Cancer Mother        BREAST  . Stroke Mother   . Seizures Mother   . Asthma Daughter   . Hypertension Maternal Grandmother   . Diabetes Maternal Grandmother   . Cancer Maternal Grandmother        BONE CANCER  . Other Neg Hx    Social History   Tobacco Use  . Smoking status: Never Smoker  . Smokeless tobacco: Never Used  Substance Use Topics  . Alcohol use: No    Comment: socially but none with pregnancy  . Drug use: No   ROS See pertinent in HPI Blood pressure 109/70, pulse 77, height 5\' 6"  (1.676 m), weight 234 lb 12.8 oz (106.5  kg), unknown if currently breastfeeding. GENERAL: Well-developed, well-nourished female in no acute distress.  ABDOMEN: Soft, nontender, nondistended. No organomegaly. PELVIC: Not performed EXTREMITIES: No cyanosis, clubbing, or edema, 2+ distal pulses.  A/P 27 yo P3023 with recent PID treatment here for follow up and depo-provera initiation - Reassurance provided - given new onset of menses, advised to continue using ibuprofen for pain control - depo-provera today - Patient to return for annual exam

## 2017-10-18 NOTE — Telephone Encounter (Signed)
Called pt to inform her of [facility] 9:30[facility]. Pt verbalized understanding and had no questions.

## 2017-10-18 NOTE — Discharge Instructions (Signed)
Keep your appointment later today Take Ibuprofen as needed for pain

## 2017-10-18 NOTE — Addendum Note (Signed)
Addended by: Lorelle GibbsWILSON, CHIQUITA L on: 10/18/2017 12:15 PM   Modules accepted: Orders

## 2017-10-19 LAB — URINE CULTURE

## 2017-10-19 LAB — GC/CHLAMYDIA PROBE AMP (~~LOC~~) NOT AT ARMC
Chlamydia: NEGATIVE
Neisseria Gonorrhea: NEGATIVE

## 2017-11-06 ENCOUNTER — Emergency Department (HOSPITAL_COMMUNITY): Payer: Medicaid Other

## 2017-11-06 ENCOUNTER — Encounter: Payer: Self-pay | Admitting: Emergency Medicine

## 2017-11-06 ENCOUNTER — Emergency Department (HOSPITAL_COMMUNITY)
Admission: EM | Admit: 2017-11-06 | Discharge: 2017-11-06 | Disposition: A | Discharge status: Home / Self Care | Payer: Medicaid Other | Attending: Emergency Medicine | Admitting: Emergency Medicine

## 2017-11-06 DIAGNOSIS — M25511 Pain in right shoulder: Secondary | ICD-10-CM | POA: Insufficient documentation

## 2017-11-06 DIAGNOSIS — M436 Torticollis: Secondary | ICD-10-CM | POA: Insufficient documentation

## 2017-11-06 DIAGNOSIS — M542 Cervicalgia: Secondary | ICD-10-CM | POA: Diagnosis present

## 2017-11-06 MED ORDER — NAPROXEN 500 MG PO TABS: 500.0000 mg | ORAL_TABLET | Freq: Two times a day (BID) | ORAL | 0 refills | Status: DC

## 2017-11-06 MED ORDER — CYCLOBENZAPRINE HCL 10 MG PO TABS: 10.0000 mg | ORAL_TABLET | Freq: Two times a day (BID) | ORAL | 0 refills | Status: DC | PRN

## 2017-11-06 NOTE — ED Provider Notes (Signed)
[facility] EMERGENCY DEPARTMENT Provider Note   CSN: [id number] Arrival date & time: 11/06/17  5784     History   Chief Complaint Chief Complaint  Patient presents with  . Neck Pain  . Shoulder Pain    HPI Chloe Rodgers is a 27 y.o. female with a past medical history of migraines, seizure disorder, who presents to ED for evaluation of 2-hour history of right shoulder pain.  States that she woke up, and felt a "pop" in her right shoulder.  She did not take any medications prior to arrival to help with symptoms and came straight to the ED.  Denies any prior fracture, dislocations or procedures in the area.  No history of similar symptoms in the past.  Denies any weakness, numbness, fever, history of gout.  HPI  Past Medical History:  Diagnosis Date  . Anemia   . Anxiety   . Chlamydia   . Complication of anesthesia    ??, seizure with wisdom teeth  . Depression    not on meds, sees a therapist  . Infection    UTI  . Kidney stone   . Migraine   . PID (acute pelvic inflammatory disease) 2014  . PONV (postoperative nausea and vomiting)   . Psoriasis   . Seizures [facility])    July 2013 - [place]    Patient Active Problem List   Diagnosis Date Noted  . Pelvic inflammatory disease 09/29/2017  . Status post repeat low transverse cesarean section 03/30/2017  . Depression affecting pregnancy in second trimester, antepartum 01/05/2017  . Supervision of normal pregnancy 10/06/2016  . Chlamydia infection affecting pregnancy in first trimester 08/23/2016  . Pelvic inflammatory disease (PID) 02/21/2013  . Migraines 07/24/2012  . Previous cesarean delivery, antepartum condition or complication 03/30/2012  . Obese 03/30/2012  . Seizure disorder (HCC) 02/03/2012  . Family history of breast cancer in mother 02/03/2012    Past Surgical History:  Procedure Laterality Date  . CESAREAN SECTION    . CESAREAN SECTION  06/06/2012   Procedure: CESAREAN SECTION;   Surgeon: Adam Phenix, MD;  Location: [facility];  Service: Obstetrics;  Laterality: N/A;  . CESAREAN SECTION N/A 03/30/2017   Procedure: REPEAT CESAREAN SECTION;  Surgeon: Lazaro Arms, MD;  Location: [facility] BIRTHING SUITES;  Service: Obstetrics;  Laterality: N/A;  . SKIN GRAFT     off abd, onto arm  . WISDOM TOOTH EXTRACTION       OB History    Gravida  5   Para  3   Term  3   Preterm  0   AB  2   Living  3     SAB  0   TAB  1   Ectopic  0   Multiple  0   Live Births  3            Home Medications    Prior to Admission medications   Medication Sig Start Date End Date Taking? Authorizing Provider  cyclobenzaprine (FLEXERIL) 10 MG tablet Take 1 tablet (10 mg total) by mouth 2 (two) times daily as needed for muscle spasms. 11/06/17   Ashanta Amoroso, PA-C  naproxen (NAPROSYN) 500 MG tablet Take 1 tablet (500 mg total) by mouth 2 (two) times daily. 11/06/17   Dietrich Pates, PA-C    Family History Family History  Problem Relation Age of Onset  . Hypertension Mother   . Diabetes Mother   . Cancer Mother  BREAST  . Stroke Mother   . Seizures Mother   . Asthma Daughter   . Hypertension Maternal Grandmother   . Diabetes Maternal Grandmother   . Cancer Maternal Grandmother        BONE CANCER  . Other Neg Hx     Social History Social History   Tobacco Use  . Smoking status: Never Smoker  . Smokeless tobacco: Never Used  Substance Use Topics  . Alcohol use: No    Comment: socially but none with pregnancy  . Drug use: No     Allergies   Patient has no known allergies.   Review of Systems Review of Systems  Constitutional: Negative for chills and fever.  Gastrointestinal: Negative for nausea and vomiting.  Musculoskeletal: Positive for arthralgias. Negative for back pain, gait problem, joint swelling, myalgias, neck pain and neck stiffness.  Skin: Negative for wound.  Neurological: Negative for weakness and numbness.     Physical Exam Updated  Vital Signs BP 123/87 (BP Location: Right Arm)   Pulse 80   Temp 98.7 F (37.1 C) (Oral)   Resp 18   LMP 11/06/2017   SpO2 100%   Physical Exam  Constitutional: She appears well-developed and well-nourished. No distress.  Nontoxic-appearing in no acute distress.  HENT:  Head: Normocephalic and atraumatic.  Eyes: Conjunctivae and EOM are normal. No scleral icterus.  Neck: Normal range of motion.  Pulmonary/Chest: Effort normal. No respiratory distress.  Musculoskeletal: Normal range of motion. She exhibits tenderness. She exhibits no edema or deformity.       Back:  Tenderness to palpation of the right shoulder as indicated in the image.  No changes to range of motion, sensation or pulses noted.  2+ radial pulse noted.  No erythema or warmth of joint.  Neurological: She is alert.  Skin: No rash noted. She is not diaphoretic.  Psychiatric: She has a normal mood and affect.  Nursing note and vitals reviewed.    ED Treatments / Results  Labs (all labs ordered are listed, but only abnormal results are displayed) Labs Reviewed - No data to display  EKG None  Radiology Dg Shoulder Right  Result Date: 11/06/2017 CLINICAL DATA:  Severe right shoulder pain after stretching at this morning. Initial encounter. EXAM: RIGHT SHOULDER - 2+ VIEW COMPARISON:  None. FINDINGS: There is no evidence of fracture or dislocation. There is no evidence of arthropathy or other focal bone abnormality. Soft tissues are unremarkable. IMPRESSION: Negative. Electronically Signed   By: Marnee Spring M.D.   On: 11/06/2017 13:33    Procedures Procedures (including critical care time)  Medications Ordered in ED Medications - No data to display   Initial Impression / Assessment and Plan / ED Course  I have reviewed the triage vital signs and the nursing notes.  Pertinent labs & imaging results that were available during my care of the patient were reviewed by me and considered in my medical decision  making (see chart for details).     Patient presents to ED for evaluation of right shoulder pain after stretching this morning.  Denies any injuries or falls.  On physical exam she is overall well-appearing.  She does have tenderness to palpation over the right shoulder with no changes to range of motion, joint swelling or warmth noted.  She is overall well-appearing.  Area is neurovascularly intact.  X-rays returned as negative.  I suspect muscle strain as a cause of her symptoms secondary to stretching.  Will give muscle relaxer  and anti-inflammatories.  Doubt infectious or vascular cause of symptoms.  Advised to return for any severe or worsening symptoms.  Portions of this note were generated with Scientist, clinical (histocompatibility and immunogenetics). Dictation errors may occur despite best attempts at proofreading.   Final Clinical Impressions(s) / ED Diagnoses   Final diagnoses:  Torticollis    ED Discharge Orders        Ordered    cyclobenzaprine (FLEXERIL) 10 MG tablet  2 times daily PRN     11/06/17 1341    naproxen (NAPROSYN) 500 MG tablet  2 times daily     11/06/17 1341       Dietrich Pates, PA-C 11/06/17 1344    Lorre Nick, MD 11/07/17 [phone]

## 2017-11-06 NOTE — ED Triage Notes (Signed)
Pt to ER for evaluation of right lateral neck pain and right shoulder pain after "stretching" this morning. In NAD

## 2017-11-06 NOTE — Discharge Instructions (Addendum)
The x-ray of your right shoulder done today was negative. You are likely suffering from a muscle strain.  Take the following medications as prescribed.

## 2017-12-07 ENCOUNTER — Encounter (HOSPITAL_COMMUNITY): Payer: Self-pay

## 2017-12-07 ENCOUNTER — Inpatient Hospital Stay (HOSPITAL_COMMUNITY)
Admission: AD | Admit: 2017-12-07 | Discharge: 2017-12-07 | Disposition: A | Discharge status: Home / Self Care | Payer: Medicaid Other | Source: Ambulatory Visit | Attending: Obstetrics & Gynecology | Admitting: Obstetrics & Gynecology

## 2017-12-07 ENCOUNTER — Inpatient Hospital Stay (HOSPITAL_COMMUNITY): Payer: Medicaid Other

## 2017-12-07 ENCOUNTER — Emergency Department (HOSPITAL_COMMUNITY)
Admission: EM | Admit: 2017-12-07 | Discharge: 2017-12-07 | Discharge status: Against Medical Advice | Payer: Medicaid Other | Source: Home / Self Care

## 2017-12-07 DIAGNOSIS — Z8742 Personal history of other diseases of the female genital tract: Secondary | ICD-10-CM

## 2017-12-07 DIAGNOSIS — R109 Unspecified abdominal pain: Secondary | ICD-10-CM | POA: Diagnosis present

## 2017-12-07 DIAGNOSIS — M545 Low back pain, unspecified: Secondary | ICD-10-CM

## 2017-12-07 DIAGNOSIS — N730 Acute parametritis and pelvic cellulitis: Secondary | ICD-10-CM | POA: Diagnosis not present

## 2017-12-07 DIAGNOSIS — Z87442 Personal history of urinary calculi: Secondary | ICD-10-CM | POA: Insufficient documentation

## 2017-12-07 DIAGNOSIS — R102 Pelvic and perineal pain unspecified side: Secondary | ICD-10-CM

## 2017-12-07 DIAGNOSIS — L409 Psoriasis, unspecified: Secondary | ICD-10-CM | POA: Insufficient documentation

## 2017-12-07 DIAGNOSIS — Z87440 Personal history of urinary (tract) infections: Secondary | ICD-10-CM | POA: Diagnosis not present

## 2017-12-07 DIAGNOSIS — F329 Major depressive disorder, single episode, unspecified: Secondary | ICD-10-CM | POA: Diagnosis not present

## 2017-12-07 DIAGNOSIS — R1030 Lower abdominal pain, unspecified: Secondary | ICD-10-CM | POA: Diagnosis not present

## 2017-12-07 LAB — CBC
HCT: 39.5 % (ref 36.0–46.0)
Hemoglobin: 12.8 g/dL (ref 12.0–15.0)
MCH: 26.9 pg (ref 26.0–34.0)
MCHC: 32.4 g/dL (ref 30.0–36.0)
MCV: 83.2 fL (ref 78.0–100.0)
Platelets: 193 10*3/uL (ref 150–400)
RBC: 4.75 MIL/uL (ref 3.87–5.11)
RDW: 14.1 % (ref 11.5–15.5)
WBC: 6.5 10*3/uL (ref 4.0–10.5)

## 2017-12-07 LAB — COMPREHENSIVE METABOLIC PANEL
ALT: 17 U/L (ref 14–54)
AST: 17 U/L (ref 15–41)
Albumin: 4 g/dL (ref 3.5–5.0)
Alkaline Phosphatase: 66 U/L (ref 38–126)
Anion gap: 10 (ref 5–15)
BUN: 12 mg/dL (ref 6–20)
CO2: 22 mmol/L (ref 22–32)
Calcium: 9.1 mg/dL (ref 8.9–10.3)
Chloride: 107 mmol/L (ref 101–111)
Creatinine, Ser: 0.8 mg/dL (ref 0.44–1.00)
GFR calc Af Amer: >60 mL/min (ref >60)
GFR calc non Af Amer: >60 mL/min (ref >60)
Glucose, Bld: 88 mg/dL (ref 65–99)
Potassium: 3.8 mmol/L (ref 3.5–5.1)
Sodium: 139 mmol/L (ref 135–145)
Total Bilirubin: 0.2 mg/dL — ABNORMAL LOW (ref 0.3–1.2)
Total Protein: 8.2 g/dL — ABNORMAL HIGH (ref 6.5–8.1)

## 2017-12-07 LAB — URINALYSIS, ROUTINE W REFLEX MICROSCOPIC
Bilirubin Urine: NEGATIVE
Glucose, UA: NEGATIVE mg/dL
Ketones, ur: NEGATIVE mg/dL
Nitrite: NEGATIVE
Protein, ur: NEGATIVE mg/dL
Specific Gravity, Urine: 1.021 (ref 1.005–1.030)
pH: 7 (ref 5.0–8.0)

## 2017-12-07 LAB — LIPASE, BLOOD: Lipase: 21 U/L (ref 11–51)

## 2017-12-07 LAB — WET PREP, GENITAL
Clue Cells Wet Prep HPF POC: NONE SEEN
Sperm: NONE SEEN
Trich, Wet Prep: NONE SEEN
Yeast Wet Prep HPF POC: NONE SEEN

## 2017-12-07 MED ORDER — ONDANSETRON 8 MG PO TBDP
8.0000 mg | ORAL_TABLET | Freq: Three times a day (TID) | ORAL | 0 refills | Status: DC | PRN
Start: 2017-12-07 — End: 2017-12-15

## 2017-12-07 MED ORDER — OXYCODONE-ACETAMINOPHEN 5-325 MG PO TABS: 1.0000 | ORAL_TABLET | ORAL | 0 refills | Status: DC | PRN

## 2017-12-07 MED ORDER — CEFTRIAXONE SODIUM 250 MG IJ SOLR
250.0000 mg | Freq: Once | INTRAMUSCULAR | Status: AC
  Administered 2017-12-07: 250 mg via INTRAMUSCULAR
  Filled 2017-12-07: qty 250

## 2017-12-07 MED ORDER — KETOROLAC TROMETHAMINE 60 MG/2ML IM SOLN
60.0000 mg | Freq: Once | INTRAMUSCULAR | Status: AC
  Administered 2017-12-07: 60 mg via INTRAMUSCULAR
  Filled 2017-12-07: qty 2

## 2017-12-07 MED ORDER — HYDROMORPHONE HCL 1 MG/ML IJ SOLN
1.0000 mg | Freq: Once | INTRAMUSCULAR | Status: AC
Start: 2017-12-07 — End: 2017-12-07
  Administered 2017-12-07: 1 mg via INTRAMUSCULAR
  Filled 2017-12-07: qty 1

## 2017-12-07 MED ORDER — DOXYCYCLINE HYCLATE 100 MG PO TABS: 100.0000 mg | ORAL_TABLET | Freq: Two times a day (BID) | ORAL | 0 refills | Status: DC

## 2017-12-07 NOTE — ED Notes (Signed)
Pt called from triage with no answer x 2 

## 2017-12-07 NOTE — MAU Note (Addendum)
Pt reports constant, sharp, lower abdominal and back pain that started 3 weeks ago. Also reports nausea that started at that time. Pt states she took ibuprofen and naproxen yesterday and it did not help. Pt states she has been spotting for 3 week. Reports a vaginal discharge with odor as well. LMP: last month. States her periods have no been regular since her baby was born 8 months ago. Pt is not on birth control. Pt states she has a history of PID and it feels like that type of pain.

## 2017-12-07 NOTE — MAU Provider Note (Signed)
Chief Complaint: Abdominal Pain and Back Pain   First Provider Initiated Contact with Patient 12/07/17 2013      SUBJECTIVE HPI: Chloe Rodgers is a 27 y.o. [id number] not currently pregnant who presents to maternity admissions reporting abdominal pain, back pain and vaginal bleeding. She has been seen multiple times in MAU for same complaints. Patient was admitted in March for PID treatment after failed outpatient management. She reports continued lower abdominal sharp abdominal pain that she describes is across her entire lower pelvis. She reports back pain is associated with the abdominal pain and has radiated to her back every time she changes positions. She rates pain 7/10- has not taken any medication today for pain, last took Naproxen and ibuprofen last night with no relief of pain. She reports getting the Depo injection on 10/18/17 and started having vaginal bleeding 3 weeks ago. She reports vaginal spotting on a panty liner. She reports vaginal discharge with strong odor is associated with vaginal bleeding that started 3 weeks ago. She denies vaginal itching/burning.   Past Medical History:  Diagnosis Date  . Anemia   . Anxiety   . Chlamydia   . Complication of anesthesia    ??, seizure with wisdom teeth  . Depression    not on meds, sees a therapist  . Infection    UTI  . Kidney stone   . Migraine   . PID (acute pelvic inflammatory disease) 2014  . PONV (postoperative nausea and vomiting)   . Psoriasis   . Seizures [facility])    July 2013 -    Past Surgical History:  Procedure Laterality Date  . CESAREAN SECTION    . CESAREAN SECTION  06/06/2012   Procedure: CESAREAN SECTION;  Surgeon: Adam Phenix, MD;  Location: [facility];  Service: Obstetrics;  Laterality: N/A;  . CESAREAN SECTION N/A 03/30/2017   Procedure: REPEAT CESAREAN SECTION;  Surgeon: Lazaro Arms, MD;  Location: [facility] BIRTHING SUITES;  Service: Obstetrics;  Laterality: N/A;  . SKIN GRAFT     off abd, onto arm   . WISDOM TOOTH EXTRACTION     Social History   Socioeconomic History  . Marital status: Single    Spouse name: Not on file  . Number of children: Not on file  . Years of education: Not on file  . Highest education level: Not on file  Occupational History  . Not on file  Social Needs  . Financial resource strain: Not on file  . Food insecurity:    Worry: Not on file    Inability: Not on file  . Transportation needs:    Medical: Not on file    Non-medical: Not on file  Tobacco Use  . Smoking status: Never Smoker  . Smokeless tobacco: Never Used  Substance and Sexual Activity  . Alcohol use: No    Comment: socially but none with pregnancy  . Drug use: No  . Sexual activity: Yes    Birth control/protection: None  Lifestyle  . Physical activity:    Days per week: Not on file    Minutes per session: Not on file  . Stress: Not on file  Relationships  . Social connections:    Talks on phone: Not on file    Gets together: Not on file    Attends religious service: Not on file    Active member of club or organization: Not on file    Attends meetings of clubs or organizations: Not on file    Relationship  status: Not on file  . Intimate partner violence:    Fear of current or ex partner: Not on file    Emotionally abused: Not on file    Physically abused: Not on file    Forced sexual activity: Not on file  Other Topics Concern  . Not on file  Social History Narrative  . Not on file   No current facility-administered medications on file prior to encounter.    Current Outpatient Medications on File Prior to Encounter  Medication Sig Dispense Refill  . ibuprofen (ADVIL,MOTRIN) 200 MG tablet Take 400 mg by mouth every 6 (six) hours as needed.    . naproxen (NAPROSYN) 500 MG tablet Take 1 tablet (500 mg total) by mouth 2 (two) times daily. 30 tablet 0  . cyclobenzaprine (FLEXERIL) 10 MG tablet Take 1 tablet (10 mg total) by mouth 2 (two) times daily as needed for muscle  spasms. 20 tablet 0   No Known Allergies  ROS:  Review of Systems  Respiratory: Negative.   Cardiovascular: Negative.   Gastrointestinal: Positive for abdominal pain. Negative for constipation and diarrhea.  Genitourinary: Positive for vaginal bleeding and vaginal discharge. Negative for difficulty urinating, dysuria, genital sores and urgency.       Vaginal odor  Musculoskeletal: Positive for back pain.  Neurological: Negative.    I have reviewed patient's Past Medical Hx, Surgical Hx, Family Hx, Social Hx, medications and allergies.   Physical Exam   Patient Vitals for the past 24 hrs:  BP Temp Temp src Pulse Resp SpO2 Height Weight  12/07/17 2246 124/68 98 F (36.7 C) Oral 78 20 - - -  12/07/17 1941 131/89 98.3 F (36.8 C) Oral 98 20 100 %  (1.676 m) 232 lb (105.2 kg)   Constitutional: Well-developed, obese female in mild acute distress.  Cardiovascular: normal rate Respiratory: normal effort GI: Abd soft, tender in lower quadrants, no guarding or rebound tenderness. Pos BS x 4 MS: Extremities nontender, no edema, normal ROM Neurologic: Alert and oriented x 4.  GU: Neg CVAT.  PELVIC EXAM: Cervix pink, visually closed, scant light brown creamy discharge with mild vaginal odor, vaginal walls and external genitalia normal, no lesions present  Bimanual exam: Cervix 0/long/high, firm, anterior, positive CMT, uterus tender, nonenlarged, adnexa without tenderness, enlargement, or mass  LAB RESULTS Results for orders placed or performed during the hospital encounter of 12/07/17 (from the past 24 hour(s))  Urinalysis, Routine w reflex microscopic     Status: Abnormal   Collection Time: 12/07/17  7:36 PM  Result Value Ref Range   Color, Urine YELLOW YELLOW   APPearance CLEAR CLEAR   Specific Gravity, Urine 1.021 1.005 - 1.030   pH 7.0 5.0 - 8.0   Glucose, UA NEGATIVE NEGATIVE mg/dL   Hgb urine dipstick LARGE (A) NEGATIVE   Bilirubin Urine NEGATIVE NEGATIVE   Ketones, ur  NEGATIVE NEGATIVE mg/dL   Protein, ur NEGATIVE NEGATIVE mg/dL   Nitrite NEGATIVE NEGATIVE   Leukocytes, UA TRACE (A) NEGATIVE   RBC / HPF 0-5 0 - 5 RBC/hpf   WBC, UA 0-5 0 - 5 WBC/hpf   Bacteria, UA RARE (A) NONE SEEN   Squamous Epithelial / LPF 0-5 0 - 5  Wet prep, genital     Status: Abnormal   Collection Time: 12/07/17  8:20 PM  Result Value Ref Range   Yeast Wet Prep HPF POC NONE SEEN NONE SEEN   Trich, Wet Prep NONE SEEN NONE SEEN   Clue Cells  Wet Prep HPF POC NONE SEEN NONE SEEN   WBC, Wet Prep HPF POC MANY (A) NONE SEEN   Sperm NONE SEEN   CBC     Status: None   Collection Time: 12/07/17  8:35 PM  Result Value Ref Range   WBC 6.5 4.0 - 10.5 K/uL   RBC 4.75 3.87 - 5.11 MIL/uL   Hemoglobin 12.8 12.0 - 15.0 g/dL   HCT 16.1 09.6 - 04.5 %   MCV 83.2 78.0 - 100.0 fL   MCH 26.9 26.0 - 34.0 pg   MCHC 32.4 30.0 - 36.0 g/dL   RDW 40.9 81.1 - 91.4 %   Platelets 193 150 - 400 K/uL  Comprehensive metabolic panel     Status: Abnormal   Collection Time: 12/07/17  8:35 PM  Result Value Ref Range   Sodium 139 135 - 145 mmol/L   Potassium 3.8 3.5 - 5.1 mmol/L   Chloride 107 101 - 111 mmol/L   CO2 22 22 - 32 mmol/L   Glucose, Bld 88 65 - 99 mg/dL   BUN 12 6 - 20 mg/dL   Creatinine, Ser 7.82 0.44 - 1.00 mg/dL   Calcium 9.1 8.9 - 95.6 mg/dL   Total Protein 8.2 (H) 6.5 - 8.1 g/dL   Albumin 4.0 3.5 - 5.0 g/dL   AST 17 15 - 41 U/L   ALT 17 14 - 54 U/L   Alkaline Phosphatase 66 38 - 126 U/L   Total Bilirubin 0.2 (L) 0.3 - 1.2 mg/dL   GFR calc non Af Amer >60 >60 mL/min   GFR calc Af Amer >60 >60 mL/min   Anion gap 10 5 - 15  Lipase, blood     Status: None   Collection Time: 12/07/17  8:35 PM  Result Value Ref Range   Lipase 21 11 - 51 U/L    --/--/A POS Performed at [facility], [address] [place], [place] [address]  (02/13 1231)  IMAGING US Pelvis Transvanginal Non-ob (tv Only)  Result Date: 12/07/2017 CLINICAL DATA:  Lower abdominal/back pain x3 weeks EXAM:  ULTRASOUND PELVIS TRANSVAGINAL TECHNIQUE: Transvaginal ultrasound examination of the pelvis was performed including evaluation of the uterus, ovaries, adnexal regions, and pelvic cul-de-sac. COMPARISON:  09/29/2017 FINDINGS: Uterus Measurements: 8.2 x 4.4 x 5.6 cm. No fibroids or other mass visualized. Endometrium Thickness: 5 mm.  No focal abnormality visualized. Right ovary Measurements: 2.7 x 1.8 x 1.8 cm. Normal appearance/no adnexal mass. Left ovary Measurements: 2.8 x 1.5 x 2.1 cm. Normal appearance/no adnexal mass. Other findings:  No abnormal free fluid. IMPRESSION: Negative pelvic ultrasound. Electronically Signed   By: Charline Bills M.D.   On: 12/07/2017 22:26    MAU Management/MDM: Orders Placed This Encounter  Procedures  . Wet prep, genital  . US PELVIS TRANSVANGINAL NON-OB (TV ONLY)  . Urinalysis, Routine w reflex microscopic  . CBC  . Comprehensive metabolic panel  . Lipase, blood   Wet prep- negative  [place]- negative pelvic ultrasound  CBC- WNL CMP- WNL  Lipase- WNL  GC/C- pending   Meds ordered this encounter  Medications  . ketorolac (TORADOL) injection 60 mg  . HYDROmorphone (DILAUDID) injection 1 mg  . cefTRIAXone (ROCEPHIN) injection 250 mg    Order Specific Question:   Antibiotic Indication:    Answer:   STD  . ondansetron (ZOFRAN ODT) 8 MG disintegrating tablet    Sig: Take 1 tablet (8 mg total) by mouth every 8 (eight) hours as needed for nausea or vomiting.  Dispense:  30 tablet    Refill:  0    Order Specific Question:   Supervising Provider    Answer:   Levie Heritage [[id number]]  . doxycycline (VIBRA-TABS) 100 MG tablet    Sig: Take 1 tablet (100 mg total) by mouth 2 (two) times daily.    Dispense:  28 tablet    Refill:  0    Order Specific Question:   Supervising Provider    Answer:   Levie Heritage [[id number]]  . oxyCODONE-acetaminophen (PERCOCET/ROXICET) 5-325 MG tablet    Sig: Take 1 tablet by mouth every 4 (four) hours as needed for up to 5 days  for severe pain.    Dispense:  15 tablet    Refill:  0    Order Specific Question:   Supervising Provider    Answer:   Levie Heritage [[id number]]    Treatments in MAU included Toradol  for pain management- pt reports slight decrease of pain to 6/10 from 7/10. Discussed [place] with patient and that there was not ovarian abscess or torsion. Discussed options of outpatient management vs inpatient management. Pt verbalizes wanting to try outpatient management for PID. Was given Rocephin and azithromycin for PID with last treatment. Will try extended treatment with 14 day Doxycycline. Patient verbalizes understanding.   1 mg Dilaudid IM given to patient prior to discharge- patient reports 0/10 pain with medication treatment. Rx for Zofran, Percocet and Doxycyline sent to pharmacy of choice. Discussed reasons to return to MAU. F/u in office in 2-3 weeks after treatment is completed in 14 days.    Pt discharged. Pt stable prior to discharge.   ASSESSMENT 1. Acute pelvic inflammatory disease (PID)   2. History of acute PID   3. Pelvic pain   4. Lower abdominal pain   5. Acute bilateral low back pain without sciatica     PLAN Discharge home Rx for Doxycycline for PID outpatient management, Zofran for N/V with medication and Percocet for pain management  Make appointment to be seen in 2-3 weeks for acute PID f/u  Discussed completing entire course of medication with patient even if patient starts to feel better- patient verbalizes understanding    Allergies as of 12/07/2017   No Known Allergies     Medication List    TAKE these medications   cyclobenzaprine 10 MG tablet Commonly known as:  FLEXERIL Take 1 tablet (10 mg total) by mouth 2 (two) times daily as needed for muscle spasms.   doxycycline 100 MG tablet Commonly known as:  VIBRA-TABS Take 1 tablet (100 mg total) by mouth 2 (two) times daily.   ibuprofen 200 MG tablet Commonly known as:  ADVIL,MOTRIN Take 400 mg by mouth every 6  (six) hours as needed.   naproxen 500 MG tablet Commonly known as:  NAPROSYN Take 1 tablet (500 mg total) by mouth 2 (two) times daily.   ondansetron 8 MG disintegrating tablet Commonly known as:  ZOFRAN ODT Take 1 tablet (8 mg total) by mouth every 8 (eight) hours as needed for nausea or vomiting.   oxyCODONE-acetaminophen 5-325 MG tablet Commonly known as:  PERCOCET/ROXICET Take 1 tablet by mouth every 4 (four) hours as needed for up to 5 days for severe pain.       Steward Drone  Certified Nurse-Midwife 12/08/2017  2:19 AM

## 2017-12-08 LAB — GC/CHLAMYDIA PROBE AMP (~~LOC~~) NOT AT ARMC
Chlamydia: NEGATIVE
Neisseria Gonorrhea: NEGATIVE

## 2017-12-08 LAB — POCT PREGNANCY, URINE: Preg Test, Ur: NEGATIVE

## 2017-12-12 ENCOUNTER — Encounter (HOSPITAL_COMMUNITY): Payer: Self-pay | Admitting: *Deleted

## 2017-12-12 ENCOUNTER — Inpatient Hospital Stay (HOSPITAL_COMMUNITY)
Admission: AD | Admit: 2017-12-12 | Discharge: 2017-12-15 | DRG: 759 | Disposition: A | Discharge status: Home / Self Care | Payer: Medicaid Other | Attending: Obstetrics and Gynecology | Admitting: Obstetrics and Gynecology

## 2017-12-12 DIAGNOSIS — K5900 Constipation, unspecified: Secondary | ICD-10-CM | POA: Diagnosis present

## 2017-12-12 DIAGNOSIS — N731 Chronic parametritis and pelvic cellulitis: Principal | ICD-10-CM | POA: Diagnosis present

## 2017-12-12 DIAGNOSIS — W1839XA Other fall on same level, initial encounter: Secondary | ICD-10-CM | POA: Diagnosis not present

## 2017-12-12 DIAGNOSIS — S8992XA Unspecified injury of left lower leg, initial encounter: Secondary | ICD-10-CM | POA: Diagnosis not present

## 2017-12-12 DIAGNOSIS — Y92230 Patient room in hospital as the place of occurrence of the external cause: Secondary | ICD-10-CM | POA: Diagnosis not present

## 2017-12-12 DIAGNOSIS — N200 Calculus of kidney: Secondary | ICD-10-CM | POA: Diagnosis present

## 2017-12-12 LAB — CBC
HCT: 38.2 % (ref 36.0–46.0)
Hemoglobin: 12.5 g/dL (ref 12.0–15.0)
MCH: 27 pg (ref 26.0–34.0)
MCHC: 32.7 g/dL (ref 30.0–36.0)
MCV: 82.5 fL (ref 78.0–100.0)
Platelets: 170 10*3/uL (ref 150–400)
RBC: 4.63 MIL/uL (ref 3.87–5.11)
RDW: 14 % (ref 11.5–15.5)
WBC: 5.8 10*3/uL (ref 4.0–10.5)

## 2017-12-12 NOTE — MAU Note (Signed)
PT SAYS SHE WAS HERE  3 DAYS AGO- DX WITH PID.   SHE TOOK MEDS- NOW VOMITING , THINKS VOMITED MEDS.  Marland Kitchen. AND STILL VOMITING  .

## 2017-12-13 ENCOUNTER — Inpatient Hospital Stay (HOSPITAL_COMMUNITY): Payer: Medicaid Other

## 2017-12-13 ENCOUNTER — Encounter (HOSPITAL_COMMUNITY): Payer: Self-pay | Admitting: Radiology

## 2017-12-13 ENCOUNTER — Other Ambulatory Visit: Payer: Self-pay

## 2017-12-13 DIAGNOSIS — S8992XA Unspecified injury of left lower leg, initial encounter: Secondary | ICD-10-CM | POA: Diagnosis not present

## 2017-12-13 DIAGNOSIS — N731 Chronic parametritis and pelvic cellulitis: Secondary | ICD-10-CM | POA: Diagnosis present

## 2017-12-13 DIAGNOSIS — W1839XA Other fall on same level, initial encounter: Secondary | ICD-10-CM | POA: Diagnosis not present

## 2017-12-13 DIAGNOSIS — N200 Calculus of kidney: Secondary | ICD-10-CM | POA: Diagnosis present

## 2017-12-13 DIAGNOSIS — K5900 Constipation, unspecified: Secondary | ICD-10-CM | POA: Diagnosis present

## 2017-12-13 DIAGNOSIS — Y92230 Patient room in hospital as the place of occurrence of the external cause: Secondary | ICD-10-CM | POA: Diagnosis not present

## 2017-12-13 LAB — URINALYSIS, ROUTINE W REFLEX MICROSCOPIC
Bacteria, UA: NONE SEEN
Bilirubin Urine: NEGATIVE
Bilirubin Urine: NEGATIVE
Glucose, UA: NEGATIVE mg/dL
Glucose, UA: NEGATIVE mg/dL
Ketones, ur: NEGATIVE mg/dL
Ketones, ur: NEGATIVE mg/dL
Leukocytes, UA: NEGATIVE
Nitrite: NEGATIVE
Nitrite: NEGATIVE
Protein, ur: NEGATIVE mg/dL
Protein, ur: NEGATIVE mg/dL
RBC / HPF: >50 RBC/hpf — ABNORMAL HIGH (ref 0–5)
Specific Gravity, Urine: 1.014 (ref 1.005–1.030)
Specific Gravity, Urine: 1.014 (ref 1.005–1.030)
pH: 6 (ref 5.0–8.0)
pH: 7 (ref 5.0–8.0)

## 2017-12-13 LAB — COMPREHENSIVE METABOLIC PANEL
ALT: 15 U/L (ref 14–54)
AST: 15 U/L (ref 15–41)
Albumin: 3.8 g/dL (ref 3.5–5.0)
Alkaline Phosphatase: 64 U/L (ref 38–126)
Anion gap: 9 (ref 5–15)
BUN: 17 mg/dL (ref 6–20)
CO2: 24 mmol/L (ref 22–32)
Calcium: 9.3 mg/dL (ref 8.9–10.3)
Chloride: 107 mmol/L (ref 101–111)
Creatinine, Ser: 0.99 mg/dL (ref 0.44–1.00)
GFR calc Af Amer: >60 mL/min (ref >60)
GFR calc non Af Amer: >60 mL/min (ref >60)
Glucose, Bld: 98 mg/dL (ref 65–99)
Potassium: 3.8 mmol/L (ref 3.5–5.1)
Sodium: 140 mmol/L (ref 135–145)
Total Bilirubin: 0.6 mg/dL (ref 0.3–1.2)
Total Protein: 7.7 g/dL (ref 6.5–8.1)

## 2017-12-13 LAB — DIFFERENTIAL
Basophils Absolute: 0 10*3/uL (ref 0.0–0.1)
Basophils Relative: 0 %
Eosinophils Absolute: 0.1 10*3/uL (ref 0.0–0.7)
Eosinophils Relative: 1 %
Lymphocytes Relative: 26 %
Lymphs Abs: 1.6 10*3/uL (ref 0.7–4.0)
Monocytes Absolute: 0.6 10*3/uL (ref 0.1–1.0)
Monocytes Relative: 10 %
Neutro Abs: 3.8 10*3/uL (ref 1.7–7.7)
Neutrophils Relative %: 63 %

## 2017-12-13 LAB — RPR: RPR Ser Ql: NONREACTIVE

## 2017-12-13 LAB — HCG, QUANTITATIVE, PREGNANCY: hCG, Beta Chain, Quant, S: <1 m[IU]/mL (ref <5)

## 2017-12-13 LAB — HIV ANTIBODY (ROUTINE TESTING W REFLEX): HIV Screen 4th Generation wRfx: NONREACTIVE

## 2017-12-13 MED ORDER — IOPAMIDOL (ISOVUE-300) INJECTION 61%
100.0000 mL | Freq: Once | INTRAVENOUS | Status: AC | PRN
  Administered 2017-12-13: 100 mL via INTRAVENOUS

## 2017-12-13 MED ORDER — IOPAMIDOL (ISOVUE-300) INJECTION 61%
30.0000 mL | Freq: Once | INTRAVENOUS | Status: AC | PRN
  Administered 2017-12-13: 30 mL via ORAL

## 2017-12-13 MED ORDER — SODIUM CHLORIDE 0.9 % IV SOLN
100.0000 mg | Freq: Two times a day (BID) | INTRAVENOUS | Status: DC
  Administered 2017-12-13 – 2017-12-14 (×4): 100 mg via INTRAVENOUS
  Filled 2017-12-13 (×4): qty 100

## 2017-12-13 MED ORDER — SODIUM CHLORIDE 0.9 % IV SOLN
2.0000 g | Freq: Two times a day (BID) | INTRAVENOUS | Status: DC
  Administered 2017-12-13 – 2017-12-15 (×6): 2 g via INTRAVENOUS
  Filled 2017-12-13 (×6): qty 2

## 2017-12-13 MED ORDER — ONDANSETRON HCL 4 MG PO TABS
4.0000 mg | ORAL_TABLET | Freq: Four times a day (QID) | ORAL | Status: DC | PRN
  Administered 2017-12-13: 4 mg via ORAL
  Filled 2017-12-13: qty 1

## 2017-12-13 MED ORDER — IBUPROFEN 600 MG PO TABS
600.0000 mg | ORAL_TABLET | Freq: Four times a day (QID) | ORAL | Status: DC | PRN
  Administered 2017-12-13: 600 mg via ORAL
  Filled 2017-12-13: qty 1

## 2017-12-13 MED ORDER — OXYCODONE-ACETAMINOPHEN 5-325 MG PO TABS
2.0000 | ORAL_TABLET | ORAL | Status: DC | PRN
  Administered 2017-12-13 – 2017-12-15 (×9): 2 via ORAL
  Filled 2017-12-13 (×9): qty 2

## 2017-12-13 MED ORDER — MORPHINE SULFATE (PF) 4 MG/ML IV SOLN
2.0000 mg | INTRAVENOUS | Status: DC | PRN
  Administered 2017-12-13: 2 mg via INTRAVENOUS
  Filled 2017-12-13: qty 1

## 2017-12-13 MED ORDER — ONDANSETRON HCL 4 MG/2ML IJ SOLN: 4.0000 mg | Freq: Four times a day (QID) | INTRAMUSCULAR | Status: DC | PRN

## 2017-12-13 NOTE — MAU Note (Signed)
Unable to access IV, call CRNA to pt room to obtain  IV access.

## 2017-12-13 NOTE — MAU Provider Note (Signed)
Chief Complaint: Emesis   First Provider Initiated Contact with Patient 12/13/17 0034      SUBJECTIVE HPI: Chloe Rodgers is a 27 y.o. [id number] with multiple treatments for PID starting in February of 2019, who presents to maternity admissions reporting lower pelvic pain persists, worsening at times, and irregular bleeding x 3-4 weeks.  She describes the pain as low in her pelvis, on both sides, sharp pain and pressure pain, that is constant but waxes and wanes.  Walking and urinating cause her pain to worsen.  She was treated initially in February by her PCP for chlamydia/PID, then had a TAB in the same month.  She presented to MAU on 3/11 and had oral treatment for PID which she tolerated. Then, on 3/21 she was admitted for worse pain, and n/v making it difficult to keep down PO medications.  She was seen again on 4/8 with ongoing pelvic pain but managed conservatively with ibuprofen since her pain was associated with menses. And, on 5/29 she was treated in MAU for PID with oral medications and antiemetics. She presents today reporting she cannot keep down any oral medications despite taking Zofran ODT.  Her pain continues and is associated with n/v with emesis x 3 in 24 hours, and intermittent fevers at home as high as 102.  She also reports intermittent spotting and heavier bleeding x 4 weeks, which is unusual.  There are no other associated symptoms. She has not tried any other treatments.    She is using Depo Provera for contraception with first injection 10/18/17.  HPI  Past Medical History:  Diagnosis Date  . Anemia   . Anxiety   . Chlamydia   . Complication of anesthesia    ??, seizure with wisdom teeth  . Depression    not on meds, sees a therapist  . Infection    UTI  . Kidney stone    kidney stones  . Migraine   . PID (acute pelvic inflammatory disease) 2014  . PONV (postoperative nausea and vomiting)   . Psoriasis   . Seizures [facility])    [place]   Past  Surgical History:  Procedure Laterality Date  . CESAREAN SECTION    . CESAREAN SECTION  06/06/2012   Procedure: CESAREAN SECTION;  Surgeon: Adam PhenixJames G Arnold, MD;  Location: [facility];  Service: Obstetrics;  Laterality: N/A;  . CESAREAN SECTION N/A 03/30/2017   Procedure: REPEAT CESAREAN SECTION;  Surgeon: Lazaro ArmsEure, Luther H, MD;  [facility];  Service: Obstetrics;  Laterality: N/A;  . SKIN GRAFT     off abd, onto arm  . WISDOM TOOTH EXTRACTION     Social History   Socioeconomic History  . Marital status: Single    Spouse name: Not on file  . Number of children: Not on file  . Years of education: Not on file  . Highest education level: Not on file  Occupational History  . Not on file  Social Needs  . Financial resource strain: Not on file  . Food insecurity:    Worry: Not on file    Inability: Not on file  . Transportation needs:    Medical: Not on file    Non-medical: Not on file  Tobacco Use  . Smoking status: Never Smoker  . Smokeless tobacco: Never Used  Substance and Sexual Activity  . Alcohol use: No    Comment: socially but none with pregnancy  . Drug use: No  . Sexual activity: Not Currently  Birth control/protection: None    Comment: last march 2019  Lifestyle  . Physical activity:    Days per week: Not on file    Minutes per session: Not on file  . Stress: Not on file  Relationships  . Social connections:    Talks on phone: Not on file    Gets together: Not on file    Attends religious service: Not on file    Active member of club or organization: Not on file    Attends meetings of clubs or organizations: Not on file    Relationship status: Not on file  . Intimate partner violence:    Fear of current or ex partner: Not on file    Emotionally abused: Not on file    Physically abused: Not on file    Forced sexual activity: Not on file  Other Topics Concern  . Not on file  Social History Narrative  . Not on file   No current  facility-administered medications on file prior to encounter.    Current Outpatient Medications on File Prior to Encounter  Medication Sig Dispense Refill  . doxycycline (VIBRA-TABS) 100 MG tablet Take 1 tablet (100 mg total) by mouth 2 (two) times daily. 28 tablet 0  . ondansetron (ZOFRAN ODT) 8 MG disintegrating tablet Take 1 tablet (8 mg total) by mouth every 8 (eight) hours as needed for nausea or vomiting. 30 tablet 0  . cyclobenzaprine (FLEXERIL) 10 MG tablet Take 1 tablet (10 mg total) by mouth 2 (two) times daily as needed for muscle spasms. 20 tablet 0  . ibuprofen (ADVIL,MOTRIN) 200 MG tablet Take 400 mg by mouth every 6 (six) hours as needed.    . naproxen (NAPROSYN) 500 MG tablet Take 1 tablet (500 mg total) by mouth 2 (two) times daily. 30 tablet 0   No Known Allergies  ROS:  Review of Systems  Constitutional: Negative for chills, fatigue and fever.  Respiratory: Negative for shortness of breath.   Cardiovascular: Negative for chest pain.  Genitourinary: Positive for pelvic pain and vaginal bleeding. Negative for difficulty urinating, dysuria, flank pain, vaginal discharge and vaginal pain.  Neurological: Negative for dizziness and headaches.  Psychiatric/Behavioral: Negative.      I have reviewed patient's Past Medical Hx, Surgical Hx, Family Hx, Social Hx, medications and allergies.   Physical Exam   Patient Vitals for the past 24 hrs:  BP Temp Temp src Pulse Resp Height Weight  12/12/17 2309 - 98.1 F (36.7 C) Oral - - - -  12/12/17 2242 (!) 130/94 98.5 F (36.9 C) Oral 87 18 5\' 6"  (1.676 m) 231 lb 4 oz (104.9 kg)   Constitutional: Well-developed, well-nourished female in mild distress.  Cardiovascular: normal rate Respiratory: normal effort GI: Abd soft, non-tender. Pos BS x 4 MS: Extremities nontender, no edema, normal ROM Neurologic: Alert and oriented x 4.  GU: Neg CVAT.  PELVIC EXAM: Cervix pink, visually closed, without lesion, small amount thin white  malodorous discharge, vaginal walls and external genitalia normal Bimanual exam: Cervix 0/long/high, firm, anterior, positive CMT, uterus tender, nonenlarged, adnexa with tenderness, enlargement, or mass   LAB RESULTS Results for orders placed or performed during the hospital encounter of 12/12/17 (from the past 24 hour(s))  CBC     Status: None   Collection Time: 12/12/17 11:26 PM  Result Value Ref Range   WBC 5.8 4.0 - 10.5 K/uL   RBC 4.63 3.87 - 5.11 MIL/uL   Hemoglobin 12.5 12.0 - 15.0 g/dL  HCT 38.2 36.0 - 46.0 %   MCV 82.5 78.0 - 100.0 fL   MCH 27.0 26.0 - 34.0 pg   MCHC 32.7 30.0 - 36.0 g/dL   RDW 16.1 09.6 - 04.5 %   Platelets 170 150 - 400 K/uL  Comprehensive metabolic panel     Status: None   Collection Time: 12/12/17 11:26 PM  Result Value Ref Range   Sodium 140 135 - 145 mmol/L   Potassium 3.8 3.5 - 5.1 mmol/L   Chloride 107 101 - 111 mmol/L   CO2 24 22 - 32 mmol/L   Glucose, Bld 98 65 - 99 mg/dL   BUN 17 6 - 20 mg/dL   Creatinine, Ser 4.09 0.44 - 1.00 mg/dL   Calcium 9.3 8.9 - 81.1 mg/dL   Total Protein 7.7 6.5 - 8.1 g/dL   Albumin 3.8 3.5 - 5.0 g/dL   AST 15 15 - 41 U/L   ALT 15 14 - 54 U/L   Alkaline Phosphatase 64 38 - 126 U/L   Total Bilirubin 0.6 0.3 - 1.2 mg/dL   GFR calc non Af Amer >60 >60 mL/min   GFR calc Af Amer >60 >60 mL/min   Anion gap 9 5 - 15  Urinalysis, Routine w reflex microscopic     Status: Abnormal   Collection Time: 12/12/17 11:30 PM  Result Value Ref Range   Color, Urine YELLOW YELLOW   APPearance HAZY (A) CLEAR   Specific Gravity, Urine 1.014 1.005 - 1.030   pH 7.0 5.0 - 8.0   Glucose, UA NEGATIVE NEGATIVE mg/dL   Hgb urine dipstick LARGE (A) NEGATIVE   Bilirubin Urine NEGATIVE NEGATIVE   Ketones, ur NEGATIVE NEGATIVE mg/dL   Protein, ur NEGATIVE NEGATIVE mg/dL   Nitrite NEGATIVE NEGATIVE   Leukocytes, UA SMALL (A) NEGATIVE   RBC / HPF >50 (H) 0 - 5 RBC/hpf   WBC, UA 11-20 0 - 5 WBC/hpf   Bacteria, UA RARE (A) NONE SEEN    Squamous Epithelial / LPF 6-10 0 - 5   Mucus PRESENT     --/--/A POS Performed at [facility], [address], [place], [place] [address]  (02/13 1231)  IMAGING US Pelvis Transvanginal Non-ob (tv Only)  Result Date: 12/07/2017 CLINICAL DATA:  Lower abdominal/back pain x3 weeks EXAM: ULTRASOUND PELVIS TRANSVAGINAL TECHNIQUE: Transvaginal ultrasound examination of the pelvis was performed including evaluation of the uterus, ovaries, adnexal regions, and pelvic cul-de-sac. COMPARISON:  09/29/2017 FINDINGS: Uterus Measurements: 8.2 x 4.4 x 5.6 cm. No fibroids or other mass visualized. Endometrium Thickness: 5 mm.  No focal abnormality visualized. Right ovary Measurements: 2.7 x 1.8 x 1.8 cm. Normal appearance/no adnexal mass. Left ovary Measurements: 2.8 x 1.5 x 2.1 cm. Normal appearance/no adnexal mass. Other findings:  No abnormal free fluid. IMPRESSION: Negative pelvic ultrasound. Electronically Signed   By: Charline Bills M.D.   On: 12/07/2017 22:26    MAU Management/MDM: CBC, CMP, quant hcg ordered.  CBC, CMP wnl, hcg pending Pt with recurrent PID vs other pathology vs pain of unknown source PID diagnosed based on CMT, report of fever at home, and n/v unable to keep down meds Consult Dr Alysia Penna with hx, assessment and findings Admit for IV management of PID I/O cath U/A and culture Antiemetics and pain management  ASSESSMENT 1. Chronic PID (chronic pelvic inflammatory disease)     PLAN Admit to Women's Unit   Sharen Counter Certified Nurse-Midwife 12/13/2017  2:40 AM

## 2017-12-13 NOTE — H&P (Signed)
Chief Complaint: Emesis   First Provider Initiated Contact with Patient 12/13/17 0034      SUBJECTIVE HPI: Chloe Rodgers is a 27 y.o. [id number] with multiple treatments for PID starting in February of 2019, who presents to maternity admissions reporting lower pelvic pain persists, worsening at times, and irregular bleeding x 3-4 weeks.  She describes the pain as low in her pelvis, on both sides, sharp pain and pressure pain, that is constant but waxes and wanes.  Walking and urinating cause her pain to worsen.  She was treated initially in February by her PCP for chlamydia/PID, then had a TAB in the same month.  She presented to MAU on 3/11 and had oral treatment for PID which she tolerated. Then, on 3/21 she was admitted for worse pain, and n/v making it difficult to keep down PO medications.  She was seen again on 4/8 with ongoing pelvic pain but managed conservatively with ibuprofen since her pain was associated with menses. And, on 5/29 she was treated in MAU for PID with oral medications and antiemetics. She presents today reporting she cannot keep down any oral medications despite taking Zofran ODT.  Her pain continues and is associated with n/v with emesis x 3 in 24 hours, and intermittent fevers at home as high as 102.  She also reports intermittent spotting and heavier bleeding x 4 weeks, which is unusual.  There are no other associated symptoms. She has not tried any other treatments.    She is using Depo Provera for contraception with first injection 10/18/17.  HPI      Past Medical History:  Diagnosis Date  . Anemia   . Anxiety   . Chlamydia   . Complication of anesthesia    ??, seizure with wisdom teeth  . Depression    not on meds, sees a therapist  . Infection    UTI  . Kidney stone    kidney stones  . Migraine   . PID (acute pelvic inflammatory disease) 2014  . PONV (postoperative nausea and vomiting)   . Psoriasis   . Seizures [facility])    July 2013 -  [place]        Past Surgical History:  Procedure Laterality Date  . CESAREAN SECTION    . CESAREAN SECTION  06/06/2012   Procedure: CESAREAN SECTION;  Surgeon: Adam Phenix, MD;  Location: [facility] ORS;  Service: Obstetrics;  Laterality: N/A;  . CESAREAN SECTION N/A 03/30/2017   Procedure: REPEAT CESAREAN SECTION;  Surgeon: Lazaro Arms, MD;  Location: [facility] BIRTHING SUITES;  Service: Obstetrics;  Laterality: N/A;  . SKIN GRAFT     off abd, onto arm  . WISDOM TOOTH EXTRACTION     Social History        Socioeconomic History  . Marital status: Single    Spouse name: Not on file  . Number of children: Not on file  . Years of education: Not on file  . Highest education level: Not on file  Occupational History  . Not on file  Social Needs  . Financial resource strain: Not on file  . Food insecurity:    Worry: Not on file    Inability: Not on file  . Transportation needs:    Medical: Not on file    Non-medical: Not on file  Tobacco Use  . Smoking status: Never Smoker  . Smokeless tobacco: Never Used  Substance and Sexual Activity  . Alcohol use: No    Comment: socially but none  with pregnancy  . Drug use: No  . Sexual activity: Not Currently    Birth control/protection: None    Comment: last march 2019  Lifestyle  . Physical activity:    Days per week: Not on file    Minutes per session: Not on file  . Stress: Not on file  Relationships  . Social connections:    Talks on phone: Not on file    Gets together: Not on file    Attends religious service: Not on file    Active member of club or organization: Not on file    Attends meetings of clubs or organizations: Not on file    Relationship status: Not on file  . Intimate partner violence:    Fear of current or ex partner: Not on file    Emotionally abused: Not on file    Physically abused: Not on file    Forced sexual activity: Not on file  Other Topics Concern   . Not on file  Social History Narrative  . Not on file   No current facility-administered medications on file prior to encounter.          Current Outpatient Medications on File Prior to Encounter  Medication Sig Dispense Refill  . doxycycline (VIBRA-TABS) 100 MG tablet Take 1 tablet (100 mg total) by mouth 2 (two) times daily. 28 tablet 0  . ondansetron (ZOFRAN ODT) 8 MG disintegrating tablet Take 1 tablet (8 mg total) by mouth every 8 (eight) hours as needed for nausea or vomiting. 30 tablet 0  . cyclobenzaprine (FLEXERIL) 10 MG tablet Take 1 tablet (10 mg total) by mouth 2 (two) times daily as needed for muscle spasms. 20 tablet 0  . ibuprofen (ADVIL,MOTRIN) 200 MG tablet Take 400 mg by mouth every 6 (six) hours as needed.    . naproxen (NAPROSYN) 500 MG tablet Take 1 tablet (500 mg total) by mouth 2 (two) times daily. 30 tablet 0   No Known Allergies  ROS:  Review of Systems  Constitutional: Negative for chills, fatigue and fever.  Respiratory: Negative for shortness of breath.   Cardiovascular: Negative for chest pain.  Genitourinary: Positive for pelvic pain and vaginal bleeding. Negative for difficulty urinating, dysuria, flank pain, vaginal discharge and vaginal pain.  Neurological: Negative for dizziness and headaches.  Psychiatric/Behavioral: Negative.      I have reviewed patient's Past Medical Hx, Surgical Hx, Family Hx, Social Hx, medications and allergies.   Physical Exam   Patient Vitals for the past 24 hrs:  BP Temp Temp src Pulse Resp Height Weight  12/12/17 2309 - 98.1 F (36.7 C) Oral - - - -  12/12/17 2242 (!) 130/94 98.5 F (36.9 C) Oral 87 18 5\' 6"  (1.676 m) 231 lb 4 oz (104.9 kg)   Constitutional: Well-developed, well-nourished female in mild distress.  Cardiovascular: normal rate Respiratory: normal effort GI: Abd soft, non-tender. Pos BS x 4 MS: Extremities nontender, no edema, normal ROM Neurologic: Alert and oriented x 4.  GU: Neg  CVAT.  PELVIC EXAM: Cervix pink, visually closed, without lesion, small amount thin white malodorous discharge, vaginal walls and external genitalia normal Bimanual exam: Cervix 0/long/high, firm, anterior, positive CMT, uterus tender, nonenlarged, adnexa with tenderness, enlargement, or mass   LAB RESULTS LabResultsLast24Hours       Results for orders placed or performed during the hospital encounter of 12/12/17 (from the past 24 hour(s))  CBC     Status: None   Collection Time: 12/12/17 11:26 PM  Result Value Ref Range   WBC 5.8 4.0 - 10.5 K/uL   RBC 4.63 3.87 - 5.11 MIL/uL   Hemoglobin 12.5 12.0 - 15.0 g/dL   HCT 16.1 09.6 - 04.5 %   MCV 82.5 78.0 - 100.0 fL   MCH 27.0 26.0 - 34.0 pg   MCHC 32.7 30.0 - 36.0 g/dL   RDW 40.9 81.1 - 91.4 %   Platelets 170 150 - 400 K/uL  Comprehensive metabolic panel     Status: None   Collection Time: 12/12/17 11:26 PM  Result Value Ref Range   Sodium 140 135 - 145 mmol/L   Potassium 3.8 3.5 - 5.1 mmol/L   Chloride 107 101 - 111 mmol/L   CO2 24 22 - 32 mmol/L   Glucose, Bld 98 65 - 99 mg/dL   BUN 17 6 - 20 mg/dL   Creatinine, Ser 7.82 0.44 - 1.00 mg/dL   Calcium 9.3 8.9 - 95.6 mg/dL   Total Protein 7.7 6.5 - 8.1 g/dL   Albumin 3.8 3.5 - 5.0 g/dL   AST 15 15 - 41 U/L   ALT 15 14 - 54 U/L   Alkaline Phosphatase 64 38 - 126 U/L   Total Bilirubin 0.6 0.3 - 1.2 mg/dL   GFR calc non Af Amer >60 >60 mL/min   GFR calc Af Amer >60 >60 mL/min   Anion gap 9 5 - 15  Urinalysis, Routine w reflex microscopic     Status: Abnormal   Collection Time: 12/12/17 11:30 PM  Result Value Ref Range   Color, Urine YELLOW YELLOW   APPearance HAZY (A) CLEAR   Specific Gravity, Urine 1.014 1.005 - 1.030   pH 7.0 5.0 - 8.0   Glucose, UA NEGATIVE NEGATIVE mg/dL   Hgb urine dipstick LARGE (A) NEGATIVE   Bilirubin Urine NEGATIVE NEGATIVE   Ketones, ur NEGATIVE NEGATIVE mg/dL   Protein, ur NEGATIVE NEGATIVE mg/dL    Nitrite NEGATIVE NEGATIVE   Leukocytes, UA SMALL (A) NEGATIVE   RBC / HPF >50 (H) 0 - 5 RBC/hpf   WBC, UA 11-20 0 - 5 WBC/hpf   Bacteria, UA RARE (A) NONE SEEN   Squamous Epithelial / LPF 6-10 0 - 5   Mucus PRESENT       --/--/A POS Performed at [facility], [address], [place], [place] [address]  (02/13 1231)  IMAGING  ImagingResults  US Pelvis Transvanginal Non-ob (tv Only)  Result Date: 12/07/2017 CLINICAL DATA:  Lower abdominal/back pain x3 weeks EXAM: ULTRASOUND PELVIS TRANSVAGINAL TECHNIQUE: Transvaginal ultrasound examination of the pelvis was performed including evaluation of the uterus, ovaries, adnexal regions, and pelvic cul-de-sac. COMPARISON:  09/29/2017 FINDINGS: Uterus Measurements: 8.2 x 4.4 x 5.6 cm. No fibroids or other mass visualized. Endometrium Thickness: 5 mm.  No focal abnormality visualized. Right ovary Measurements: 2.7 x 1.8 x 1.8 cm. Normal appearance/no adnexal mass. Left ovary Measurements: 2.8 x 1.5 x 2.1 cm. Normal appearance/no adnexal mass. Other findings:  No abnormal free fluid. IMPRESSION: Negative pelvic ultrasound. Electronically Signed   By: Charline Bills M.D.   On: 12/07/2017 22:26     MAU Management/MDM: CBC, CMP, quant hcg ordered.  CBC, CMP wnl, hcg pending Pt with recurrent PID vs other pathology vs pain of unknown source PID diagnosed based on CMT, report of fever at home, and n/v unable to keep down meds Consult Dr Alysia Penna with hx, assessment and findings Admit for IV management of PID I/O cath U/A and culture Antiemetics and pain management  ASSESSMENT 1. Chronic PID (chronic pelvic inflammatory disease)   2. Nausea, vomiting, recent constipation followed by diarrhea with anorexia, still with decreased appetite. Pelvic imaging was negative. Will continue to cover with antibiotic for PID but will order CT with contrast to evaluate for diverticulitis or other primary GI diagnosis  PLAN Admit to Women's  Unit  V ABX CT with contrast  Adam PhenixArnold, Tonny Isensee G, MD 12/13/2017 10:44 AM

## 2017-12-14 ENCOUNTER — Encounter (HOSPITAL_COMMUNITY): Payer: Self-pay | Admitting: Anesthesiology

## 2017-12-14 DIAGNOSIS — N731 Chronic parametritis and pelvic cellulitis: Principal | ICD-10-CM

## 2017-12-14 LAB — URINE CULTURE: Culture: NO GROWTH

## 2017-12-14 MED ORDER — DOXYCYCLINE HYCLATE 100 MG PO TABS
100.0000 mg | ORAL_TABLET | Freq: Two times a day (BID) | ORAL | Status: DC
  Administered 2017-12-14 – 2017-12-15 (×2): 100 mg via ORAL
  Filled 2017-12-14 (×4): qty 1
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
## 2017-12-14 NOTE — Plan of Care (Signed)
  Problem: Education: Goal: Knowledge of General Education information will improve Outcome: Progressing   Problem: Health Behavior/Discharge Planning: Goal: Ability to manage health-related needs will improve Outcome: Progressing   Problem: Clinical Measurements: Goal: Ability to maintain clinical measurements within normal limits will improve Outcome: Progressing   Problem: Clinical Measurements: Goal: Will remain free from infection Outcome: Progressing   Problem: Clinical Measurements: Goal: Diagnostic test results will improve Outcome: Progressing   Problem: Clinical Measurements: Goal: Respiratory complications will improve Outcome: Progressing   

## 2017-12-14 NOTE — Progress Notes (Signed)
Subjective: called to see pt after an unattended fall at approx 7 pm  Patient reports that while on the way to bathroom unassisted, she became lightheaded, turned around and fell to floor. She did not lose consciousness, and went down on her left knee, then crawled a few feet to bed to notify staff who came to pt eval and help her in to bed. No LOC, bleeding or swelling.  Ice pack applied, and pt will ask for assistance before going to BR again.   Objective: I have reviewed patient's extremities. no upper body injury.  General: alert, cooperative and no distress Extremities: Homans sign is negative, no sign of DVT and left knee stable , patella in place, negative drawer sign, able to flex knee with slight discomfort over knee cap. able to allow palpation of knee without focal pain site.   Assessment/Plan: Minor Soft tissue injury after unattended fall without LOC. ICE pack x 20 mins q 2 hr tonight.  Reassess in a.m.  LOS: 1 day    Tilda BurrowJohn V Antonios Ostrow 12/14/2017, 7:45 PM

## 2017-12-14 NOTE — Progress Notes (Signed)
Pt IV site got infiltrated. RN attempted  x1 and CRNA was notified and attempted x3( I hour in pt's room). CRNA will notify oncoming CRNA to also give an attempt with a vein finder. A.M. 2nd Bag of antibiotic is held until I.v access is obtained.

## 2017-12-14 NOTE — Progress Notes (Signed)
Subjective: Patient reports nausea and + flatus.  No BM for 4 days after diarrhea. Appetite is poor. No fever    Objective: I have reviewed patient's vital signs, medications, labs and radiology results. Blood pressure (!) 93/56, pulse 80, temperature 98.8 F (37.1 C), temperature source Oral, resp. rate 20, height 5\' 6"  (1.676 m), weight 104.9 kg (231 lb 4 oz), last menstrual period 10/12/2017, SpO2 99 %, unknown if currently breastfeeding. Body mass index is 37.32 kg/m.  General: alert, cooperative and no distress GI: normal findings: no masses palpable Extremities: extremities normal, atraumatic, no cyanosis or edema Mild s/p tenderness CLINICAL DATA:  Lower abdominopelvic pain for 1 month. Nausea and vomiting. Fever and diarrhea. Prior history of pelvic inflammatory disease and nephrolithiasis.  EXAM: CT ABDOMEN AND PELVIS WITH CONTRAST  TECHNIQUE: Multidetector CT imaging of the abdomen and pelvis was performed using the standard protocol following bolus administration of intravenous contrast.  CONTRAST:  100mL ISOVUE-300 IOPAMIDOL (ISOVUE-300) INJECTION 61%  COMPARISON:  Noncontrast CT on 11/02/2015  FINDINGS: Lower Chest: No acute findings.  Hepatobiliary: No hepatic masses identified. Gallbladder is unremarkable.  Pancreas:  No mass or inflammatory changes.  Spleen: Within normal limits in size and appearance.  Adrenals/Urinary Tract: A few tiny 1-2 mm nonobstructing right renal calculi are seen. No evidence of ureteral calculi or hydronephrosis. Unremarkable unopacified urinary bladder.  Stomach/Bowel: No evidence of obstruction, inflammatory process or abnormal fluid collections.  Vascular/Lymphatic: No pathologically enlarged lymph nodes. No abdominal aortic aneurysm.  Reproductive:  No mass or other significant abnormality.  Other:  None.  Musculoskeletal:  No suspicious bone lesions identified.  IMPRESSION: Tiny 1-2 mm  nonobstructing right renal calculi. No evidence of ureteral calculi, hydronephrosis, or other acute findings   Electronically Signed   By: Myles RosenthalJohn  Stahl M.D.   On: 12/13/2017 14:13 Assessment/Plan: Pelvic pain, nausea and decreased appetite for several weeks, normal CT except tiny renal calculi. Consider GI consult if not resolving  LOS: 1 day    Scheryl DarterJames Cheston Coury 12/14/2017, 9:11 AM

## 2017-12-14 NOTE — Progress Notes (Signed)
Dr. Beatrice LecherFergusan notified of pt fall at 1845. Pt rates L knee pain 8/10 "soreness".  Vital signs assessed.  MD to assess.

## 2017-12-15 MED ORDER — BISACODYL 10 MG RE SUPP
10.0000 mg | Freq: Once | RECTAL | Status: AC
  Administered 2017-12-15: 10 mg via RECTAL
  Filled 2017-12-15: qty 1

## 2017-12-15 MED ORDER — IBUPROFEN 600 MG PO TABS: 600.0000 mg | ORAL_TABLET | Freq: Four times a day (QID) | ORAL | 0 refills | Status: DC | PRN

## 2017-12-15 MED ORDER — OXYCODONE-ACETAMINOPHEN 5-325 MG PO TABS: 1.0000 | ORAL_TABLET | ORAL | 0 refills | Status: DC | PRN

## 2017-12-15 MED ORDER — DOXYCYCLINE HYCLATE 100 MG PO TABS: 100.0000 mg | ORAL_TABLET | Freq: Two times a day (BID) | ORAL | 0 refills | Status: DC

## 2017-12-15 MED ORDER — ONDANSETRON HCL 4 MG PO TABS: 4.0000 mg | ORAL_TABLET | Freq: Four times a day (QID) | ORAL | 0 refills | Status: DC | PRN

## 2017-12-15 NOTE — Progress Notes (Signed)
Pt encouraged to take meds as ordered and make appointment  For follow up

## 2017-12-15 NOTE — Discharge Instructions (Signed)
Pelvic Inflammatory Disease °Pelvic inflammatory disease (PID) is an infection in some or all of the female organs. PID can be in the uterus, ovaries, fallopian tubes, or the surrounding tissues that are inside the lower belly area (pelvis). PID can lead to lasting problems if it is not treated. To check for this disease, your doctor may: °· Do a physical exam. °· Do blood tests, urine tests, or a pregnancy test. °· Look at your vaginal discharge. °· Do tests to look inside the pelvis. °· Test you for other infections. ° °Follow these instructions at home: °· Take over-the-counter and prescription medicines only as told by your doctor. °· If you were prescribed an antibiotic medicine, take it as told by your doctor. Do not stop taking it even if you start to feel better. °· Do not have sex until treatment is done or as told by your doctor. °· Tell your sex partner if you have PID. Your partner may need to be treated. °· Keep all follow-up visits as told by your doctor. This is important. °· Your doctor may test you for infection again 3 months after you are treated. °Contact a doctor if: °· You have more fluid (discharge) coming from your vagina or fluid that is not normal. °· Your pain does not improve. °· You throw up (vomit). °· You have a fever. °· You cannot take your medicines. °· Your partner has a sexually transmitted disease (STD). °· You have pain when you pee (urinate). °Get help right away if: °· You have more belly (abdominal) or lower belly pain. °· You have chills. °· You are not better after 72 hours. °This information is not intended to replace advice given to you by your health care provider. Make sure you discuss any questions you have with your health care provider. °Document Released: 09/24/2008 Document Revised: 12/04/2015 Document Reviewed: 08/05/2014 °Elsevier Interactive Patient Education © 2018 [employer] ° °

## 2017-12-15 NOTE — Discharge Summary (Signed)
Physician Discharge Summary  Patient ID: Chloe Rodgers MRN: [id number] DOB/AGE: 1991/01/10 27 y.o.  Admit date: 12/12/2017 Discharge date: 12/15/2017  Admission Diagnoses:Active Problems:   Chronic PID (chronic pelvic inflammatory disease)  Discharge Diagnoses:  Active Problems:   Chronic PID (chronic pelvic inflammatory disease) Constipation  Discharged Condition: fair  Hospital Course: Chief Complaint:Emesis  First Provider Initiated Contact with Patient 12/13/17 0034    SUBJECTIVE Chloe Rodgers a 27 [id number] with multiple treatments for PID starting in February of 2019,who presents to maternity admissions reportinglower pelvic pain persists, worsening at times, and irregular bleeding x 3-4 weeks. She describes the pain as low in her pelvis, on both sides, sharp pain and pressure pain, that is constant but waxes and wanes. Walking and urinating cause her pain to worsen. She was treated initially in February by her PCP for chlamydia/PID, then had a TAB in the same month. She presented to MAU on 3/11 and had oral treatment for PID which she tolerated. Then, on 3/21 she was admitted for worse pain, and n/v making it difficult to keep down PO medications. She was seen again on 4/8 with ongoing pelvic pain but managed conservatively with ibuprofen since her pain was associated with menses. And, on 5/29 she was treated in MAU for PID with oral medications and antiemetics. She presents today reporting she cannot keep down any oral medications despite taking Zofran ODT. Her pain continues and is associated with n/v with emesis x 3 in 24 hours, and intermittent fevers at home as high as 102. She also reports intermittent spotting and heavier bleeding x 4 weeks, which is unusual. There are no other associated symptoms. She has not tried any other treatments.   She is using Depo Provera for contraception with first injection 10/18/17.  HPI      Past Medical  History:  Diagnosis Date  . Anemia   . Anxiety   . Chlamydia   . Complication of anesthesia    ??, seizure with wisdom teeth  . Depression    not on meds, sees a therapist  . Infection    UTI  . Kidney stone    kidney stones  . Migraine   . PID (acute pelvic inflammatory disease) 2014  . PONV (postoperative nausea and vomiting)   . Psoriasis   . Seizures [facility])    July 2013 - [place]        Past Surgical History:  Procedure Laterality Date  . CESAREAN SECTION    . CESAREAN SECTION  06/06/2012   Procedure: CESAREAN SECTION; Surgeon: Adam Phenix, MD; Location: [facility]; Service: Obstetrics; Laterality: N/A;  . CESAREAN SECTION N/A 03/30/2017   Procedure: REPEAT CESAREAN SECTION; Surgeon: Lazaro Arms, MD; Location: [facility] BIRTHING SUITES; Service: Obstetrics; Laterality: N/A;  . SKIN GRAFT     off abd, onto arm  . WISDOM TOOTH EXTRACTION     Social History        Socioeconomic History  . Marital status: Single    Spouse name: Not on file  . Number of children: Not on file  . Years of education: Not on file  . Highest education level: Not on file  Occupational History  . Not on file  Social Needs  . Financial resource strain: Not on file  . Food insecurity:    Worry: Not on file    Inability: Not on file  . Transportation needs:    Medical: Not on file    Non-medical: Not on file  Tobacco Use  .  Smoking status: Never Smoker  . Smokeless tobacco: Never Used  Substance and Sexual Activity  . Alcohol use: No    Comment: socially but none with pregnancy  . Drug use: No  . Sexual activity: Not Currently    Birth control/protection: None    Comment: last march 2019  Lifestyle  . Physical activity:    Days per week: Not on file    Minutes per session: Not on file  . Stress: Not on file  Relationships  . Social connections:    Talks on phone: Not on file    Gets together: Not on file     Attends religious service: Not on file    Active member of club or organization: Not on file    Attends meetings of clubs or organizations: Not on file    Relationship status: Not on file  . Intimate partner violence:    Fear of current or ex partner: Not on file    Emotionally abused: Not on file    Physically abused: Not on file    Forced sexual activity: Not on file  Other Topics Concern  . Not on file  Social History Narrative  . Not on file   No current facility-administered medications on file prior to encounter.          Current Outpatient Medications on File Prior to Encounter  Medication Sig Dispense Refill  . doxycycline (VIBRA-TABS) 100 MG tablet Take 1 tablet (100 mg total) by mouth 2 (two) times daily. 28 tablet 0  . ondansetron (ZOFRAN ODT) 8 MG disintegrating tablet Take 1 tablet (8 mg total) by mouth every 8 (eight) hours as needed for nausea or vomiting. 30 tablet 0  . cyclobenzaprine (FLEXERIL) 10 MG tablet Take 1 tablet (10 mg total) by mouth 2 (two) times daily as needed for muscle spasms. 20 tablet 0  . ibuprofen (ADVIL,MOTRIN) 200 MG tablet Take 400 mg by mouth every 6 (six) hours as needed.    . naproxen (NAPROSYN) 500 MG tablet Take 1 tablet (500 mg total) by mouth 2 (two) times daily. 30 tablet 0   No Known Allergies  ROS: Review of Systems  Constitutional: Negative forchills,fatigueand fever.  Respiratory: Negative forshortness of breath.  Cardiovascular: Negative forchest pain.  Genitourinary: Positive forpelvic painand vaginal bleeding. Negative fordifficulty urinating,dysuria,flank pain,vaginal dischargeand vaginal pain.  Neurological: Negative fordizzinessand headaches.  Psychiatric/Behavioral:Negative.    I have reviewed patient's Past Medical Hx, Surgical Hx, Family Hx, Social Hx, medications and allergies.   Physical Exam   Patient Vitals for the past 24 hrs:  BP Temp Temp src Pulse Resp  Height Weight  12/12/17 2309 - 98.1 F (36.7 C) Oral - - - -  12/12/17 2242 (!) 130/94 98.5 F (36.9 C) Oral 87 18 5\' 6"  (1.676 m) 231 lb 4 oz (104.9 kg)   Constitutional: Well-developed, well-nourished female inmilddistress.  Cardiovascular: normal rate Respiratory: normal effort GI: Abd soft, non-tender. Pos BS x 4 MS: Extremities nontender, no edema, normal ROM Neurologic: Alert and oriented x 4.  GU: Neg CVAT.  PELVIC EXAM: Cervix pink, visually closed, without lesion,small amount thin white malodorous discharge, vaginal walls and external genitalia normal Bimanual exam: Cervix 0/long/high, firm, anterior,positiveCMT, uterus tender, nonenlarged, adnexa with tenderness, enlargement, or mass   LAB RESULTS LabResultsLast24Hours       Results for orders placed or performed during the hospital encounter of 12/12/17 (from the past 24 hour(s))  CBC Status: None   Collection Time: 12/12/17 11:26 PM  Result Value Ref Range   WBC 5.8 4.0 - 10.5 K/uL   RBC 4.63 3.87 - 5.11 MIL/uL   Hemoglobin 12.5 12.0 - 15.0 g/dL   HCT 16.138.2 09.636.0 - 04.546.0 %   MCV 82.5 78.0 - 100.0 fL   MCH 27.0 26.0 - 34.0 pg   MCHC 32.7 30.0 - 36.0 g/dL   RDW 40.914.0 81.111.5 - 91.415.5 %   Platelets 170 150 - 400 K/uL  Comprehensive metabolic panel Status: None   Collection Time: 12/12/17 11:26 PM  Result Value Ref Range   Sodium 140 135 - 145 mmol/L   Potassium 3.8 3.5 - 5.1 mmol/L   Chloride 107 101 - 111 mmol/L   CO2 24 22 - 32 mmol/L   Glucose, Bld 98 65 - 99 mg/dL   BUN 17 6 - 20 mg/dL   Creatinine, Ser 7.820.99 0.44 - 1.00 mg/dL   Calcium 9.3 8.9 - 95.610.3 mg/dL   Total Protein 7.7 6.5 - 8.1 g/dL   Albumin 3.8 3.5 - 5.0 g/dL   AST 15 15 - 41 U/L   ALT 15 14 - 54 U/L   Alkaline Phosphatase 64 38 - 126 U/L   Total Bilirubin 0.6 0.3 - 1.2 mg/dL   GFR calc non Af Amer >60 >60 mL/min   GFR calc Af Amer >60 >60 mL/min   Anion gap 9 5 - 15  Urinalysis, Routine w reflex  microscopic Status: Abnormal   Collection Time: 12/12/17 11:30 PM  Result Value Ref Range   Color, Urine YELLOW YELLOW   APPearance HAZY (A) CLEAR   Specific Gravity, Urine 1.014 1.005 - 1.030   pH 7.0 5.0 - 8.0   Glucose, UA NEGATIVE NEGATIVE mg/dL   Hgb urine dipstick LARGE (A) NEGATIVE   Bilirubin Urine NEGATIVE NEGATIVE   Ketones, ur NEGATIVE NEGATIVE mg/dL   Protein, ur NEGATIVE NEGATIVE mg/dL   Nitrite NEGATIVE NEGATIVE   Leukocytes, UA SMALL (A) NEGATIVE   RBC / HPF >50 (H) 0 - 5 RBC/hpf   WBC, UA 11-20 0 - 5 WBC/hpf   Bacteria, UA RARE (A) NONE SEEN   Squamous Epithelial / LPF 6-10 0 - 5   Mucus PRESENT       --/--/A POS Performed at [facility], [address] [place], [place] [address] (02/13 1231)  IMAGING  ImagingResults  [place] Pelvis Transvanginal Non-ob (tv Only)  Result Date: 12/07/2017 CLINICAL DATA: Lower abdominal/back pain x3 weeks EXAM: ULTRASOUND PELVIS TRANSVAGINAL TECHNIQUE: Transvaginal ultrasound examination of the pelvis was performed including evaluation of the uterus, ovaries, adnexal regions, and pelvic cul-de-sac. COMPARISON: 09/29/2017 FINDINGS: Uterus Measurements: 8.2 x 4.4 x 5.6 cm. No fibroids or other mass visualized. Endometrium Thickness: 5 mm. No focal abnormality visualized. Right ovary Measurements: 2.7 x 1.8 x 1.8 cm. Normal appearance/no adnexal mass. Left ovary Measurements: 2.8 x 1.5 x 2.1 cm. Normal appearance/no adnexal mass. Other findings: No abnormal free fluid. IMPRESSION: Negative pelvic ultrasound. Electronically Signed By: Charline BillsSriyesh Krishnan M.D. On: 12/07/2017 22:26     MAU Management/MDM: CBC, CMP, quant hcg ordered. CBC, CMP wnl, hcg pending Pt with recurrent PID vs other pathology vs pain of unknown source PID diagnosed based on CMT, report of fever at home, and n/v unable to keep down meds Consult Dr Alysia PennaErvin with hx, assessment and findings Admit for IV management of PID I/O cath  U/A and culture Antiemetics and pain management  ASSESSMENT 1. Chronic PID (chronic pelvic inflammatory disease)  2. Nausea, vomiting, recent constipation followed by diarrhea  with anorexia, still with decreased appetite. Pelvic imaging was negative. Will continue to cover with antibiotic for PID but will order CT with contrast to evaluate for diverticulitis or other primary GI diagnosis  PLAN Admit to Women's Unit  She was admitted for IV antibiotics and received Mefoxin and doxycycline. She was afebrile and continued to complain of nausea, low abdominal pain and constipation and episodes of diarrhea as well before hospitalization. CT scan was normal except tiny kidney stones. She responded to Dulcolax and felt better after having a bowel movement.   Consults: None  Significant Diagnostic Studies: radiology: CT scan: abdomen and pelvis  Treatments: IV hydration and antibiotics: as noted  Discharge Exam: Blood pressure 108/63, pulse 80, temperature 98.2 F (36.8 C), temperature source Oral, resp. rate 18, height 5\' 6"  (1.676 m), weight 104.9 kg (231 lb 4 oz), last menstrual period 10/12/2017, SpO2 100 %, unknown if currently breastfeeding. General appearance: alert, cooperative and no distress GI: soft, tenderness minimal Extremities: extremities normal, atraumatic, no cyanosis or edema  Disposition: Discharge disposition: 01-Home or Self Care        Allergies as of 12/15/2017   No Known Allergies     Medication List    STOP taking these medications   ondansetron 8 MG disintegrating tablet Commonly known as:  ZOFRAN ODT     TAKE these medications   doxycycline 100 MG tablet Commonly known as:  VIBRA-TABS Take 1 tablet (100 mg total) by mouth every 12 (twelve) hours. What changed:  when to take this   ibuprofen 600 MG tablet Commonly known as:  ADVIL,MOTRIN Take 1 tablet (600 mg total) by mouth every 6 (six) hours as needed (mild pain).   ondansetron 4 MG  tablet Commonly known as:  ZOFRAN Take 1 tablet (4 mg total) by mouth every 6 (six) hours as needed for nausea.   oxyCODONE-acetaminophen 5-325 MG tablet Commonly known as:  PERCOCET/ROXICET Take 1-2 tablets by mouth every 4 (four) hours as needed (moderate to severe pain (when tolerating fluids)). What changed:    how much to take  reasons to take this      Follow-up Information    [facility] Follow up in 2 week(s).   Specialty:  Obstetrics and Gynecology Contact information: [address] [place] [address] [phone]       [facility] Follow up in 1 week(s).   Why:  recommend GI referral by her PCP Contact information: [address] [place] [address] [phone]           Signed: Scheryl Darter 12/15/2017, 2:24 PM

## 2017-12-26 ENCOUNTER — Other Ambulatory Visit: Payer: Self-pay

## 2017-12-26 ENCOUNTER — Emergency Department: Payer: Medicaid Other

## 2017-12-26 ENCOUNTER — Emergency Department
Admission: EM | Admit: 2017-12-26 | Discharge: 2017-12-26 | Disposition: A | Discharge status: Home / Self Care | Payer: Medicaid Other | Attending: Emergency Medicine | Admitting: Emergency Medicine

## 2017-12-26 DIAGNOSIS — F419 Anxiety disorder, unspecified: Secondary | ICD-10-CM | POA: Insufficient documentation

## 2017-12-26 DIAGNOSIS — N390 Urinary tract infection, site not specified: Secondary | ICD-10-CM

## 2017-12-26 DIAGNOSIS — F329 Major depressive disorder, single episode, unspecified: Secondary | ICD-10-CM | POA: Insufficient documentation

## 2017-12-26 DIAGNOSIS — R1030 Lower abdominal pain, unspecified: Secondary | ICD-10-CM | POA: Diagnosis present

## 2017-12-26 DIAGNOSIS — R102 Pelvic and perineal pain unspecified side: Secondary | ICD-10-CM

## 2017-12-26 LAB — WET PREP, GENITAL
Clue Cells Wet Prep HPF POC: NONE SEEN
Sperm: NONE SEEN
Trich, Wet Prep: NONE SEEN
Yeast Wet Prep HPF POC: NONE SEEN

## 2017-12-26 LAB — URINALYSIS, COMPLETE (UACMP) WITH MICROSCOPIC
Bilirubin Urine: NEGATIVE
Glucose, UA: NEGATIVE mg/dL
Ketones, ur: NEGATIVE mg/dL
Nitrite: NEGATIVE
Protein, ur: 30 mg/dL — AB
RBC / HPF: >50 RBC/hpf — ABNORMAL HIGH (ref 0–5)
Specific Gravity, Urine: 1.02 (ref 1.005–1.030)
pH: 6 (ref 5.0–8.0)

## 2017-12-26 LAB — COMPREHENSIVE METABOLIC PANEL
ALT: 17 U/L (ref 14–54)
AST: 18 U/L (ref 15–41)
Albumin: 3.8 g/dL (ref 3.5–5.0)
Alkaline Phosphatase: 64 U/L (ref 38–126)
Anion gap: 8 (ref 5–15)
BUN: 16 mg/dL (ref 6–20)
CO2: 22 mmol/L (ref 22–32)
Calcium: 8.8 mg/dL — ABNORMAL LOW (ref 8.9–10.3)
Chloride: 107 mmol/L (ref 101–111)
Creatinine, Ser: 0.67 mg/dL (ref 0.44–1.00)
GFR calc Af Amer: >60 mL/min (ref >60)
GFR calc non Af Amer: >60 mL/min (ref >60)
Glucose, Bld: 99 mg/dL (ref 65–99)
Potassium: 3.8 mmol/L (ref 3.5–5.1)
Sodium: 137 mmol/L (ref 135–145)
Total Bilirubin: 0.4 mg/dL (ref 0.3–1.2)
Total Protein: 7.5 g/dL (ref 6.5–8.1)

## 2017-12-26 LAB — CBC WITH DIFFERENTIAL/PLATELET
Basophils Absolute: 0 10*3/uL (ref 0–0.1)
Basophils Relative: 1 %
Eosinophils Absolute: 0.1 10*3/uL (ref 0–0.7)
Eosinophils Relative: 2 %
HCT: 35.8 % (ref 35.0–47.0)
Hemoglobin: 12 g/dL (ref 12.0–16.0)
Lymphocytes Relative: 21 %
Lymphs Abs: 1.1 10*3/uL (ref 1.0–3.6)
MCH: 27.1 pg (ref 26.0–34.0)
MCHC: 33.7 g/dL (ref 32.0–36.0)
MCV: 80.4 fL (ref 80.0–100.0)
Monocytes Absolute: 0.5 10*3/uL (ref 0.2–0.9)
Monocytes Relative: 9 %
Neutro Abs: 3.8 10*3/uL (ref 1.4–6.5)
Neutrophils Relative %: 69 %
Platelets: 162 10*3/uL (ref 150–440)
RBC: 4.45 MIL/uL (ref 3.80–5.20)
RDW: 15 % — ABNORMAL HIGH (ref 11.5–14.5)
WBC: 5.6 10*3/uL (ref 3.6–11.0)

## 2017-12-26 LAB — CHLAMYDIA/NGC RT PCR (ARMC ONLY)
Chlamydia Tr: NOT DETECTED
N gonorrhoeae: NOT DETECTED

## 2017-12-26 LAB — POCT PREGNANCY, URINE: Preg Test, Ur: NEGATIVE

## 2017-12-26 MED ORDER — NITROFURANTOIN MONOHYD MACRO 100 MG PO CAPS: 100.0000 mg | ORAL_CAPSULE | Freq: Two times a day (BID) | ORAL | 0 refills | Status: DC

## 2017-12-26 MED ORDER — CEPHALEXIN 500 MG PO CAPS
500.0000 mg | ORAL_CAPSULE | Freq: Once | ORAL | Status: AC
  Administered 2017-12-26: 500 mg via ORAL
  Filled 2017-12-26: qty 1

## 2017-12-26 MED ORDER — OXYCODONE-ACETAMINOPHEN 5-325 MG PO TABS
1.0000 | ORAL_TABLET | Freq: Once | ORAL | Status: AC
  Administered 2017-12-26: 1 via ORAL
  Filled 2017-12-26: qty 1

## 2017-12-26 MED ORDER — CEPHALEXIN 500 MG PO CAPS: 500.0000 mg | ORAL_CAPSULE | Freq: Three times a day (TID) | ORAL | 0 refills | Status: AC

## 2017-12-26 NOTE — ED Notes (Signed)
AAOx3.  Skin warm and dry.  NAD 

## 2017-12-26 NOTE — ED Provider Notes (Signed)
[facility] Emergency Department Provider Note ____________________________________________   First MD Initiated Contact with Patient 12/26/17 [id number]     (approximate)  I have reviewed the triage vital signs and the nursing notes.   HISTORY  Chief Complaint Abdominal Pain  HPI Albertina ParrShakilla Huyett is a 27 y.o. female with a history of "chronic PID" who is presenting to the emergency department with persistent lower abdominal pain, vaginal discharge and bleeding as well as intermittent nausea and vomiting.  She says that she was just admitted several weeks ago and was treated with IV antibiotics and discharged on p.o. doxycycline at [facility]" in [place].  She says the pain is moderate to severe at this time and sharp in the lower abdomen and sometimes radiating through to her back.  She says that she has not been sexually active for approximately 4 months at this time.  Had an elective abortion earlier this year as well.  Says that she had her last bowel movement 2 days ago.  Says that she is able to tolerate p.o. fluids but has difficulty tolerating solids.  Past Medical History:  Diagnosis Date  . Anemia   . Anxiety   . Chlamydia   . Complication of anesthesia    ??, seizure with wisdom teeth  . Depression    not on meds, sees a therapist  . Infection    UTI  . Kidney stone    kidney stones  . Migraine   . PID (acute pelvic inflammatory disease) 2014  . PONV (postoperative nausea and vomiting)   . Psoriasis   . Seizures [facility])    [place]    Patient Active Problem List   Diagnosis Date Noted  . Chronic PID (chronic pelvic inflammatory disease) 12/13/2017  . Pelvic inflammatory disease 09/29/2017  . Status post repeat low transverse cesarean section 03/30/2017  . Depression affecting pregnancy in second trimester, antepartum 01/05/2017  . Supervision of normal pregnancy 10/06/2016  . Chlamydia infection affecting pregnancy in  first trimester 08/23/2016  . Pelvic inflammatory disease (PID) 02/21/2013  . Migraines 07/24/2012  . Previous cesarean delivery, antepartum condition or complication 03/30/2012  . Obese 03/30/2012  . Seizure disorder (HCC) 02/03/2012  . Family history of breast cancer in mother 02/03/2012    Past Surgical History:  Procedure Laterality Date  . CESAREAN SECTION    . CESAREAN SECTION  06/06/2012   Procedure: CESAREAN SECTION;  Surgeon: Adam PhenixJames G Arnold, MD;  Location: [facility];  Service: Obstetrics;  Laterality: N/A;  . CESAREAN SECTION N/A 03/30/2017   Procedure: REPEAT CESAREAN SECTION;  Surgeon: Lazaro ArmsEure, Luther H, MD;  [facility];  Service: Obstetrics;  Laterality: N/A;  . SKIN GRAFT     off abd, onto arm  . WISDOM TOOTH EXTRACTION      Prior to Admission medications   Medication Sig Start Date End Date Taking? Authorizing Provider  ondansetron (ZOFRAN) 4 MG tablet Take 1 tablet (4 mg total) by mouth every 6 (six) hours as needed for nausea. 12/15/17  Yes Adam PhenixArnold, James G, MD  doxycycline (VIBRA-TABS) 100 MG tablet Take 1 tablet (100 mg total) by mouth every 12 (twelve) hours. Patient not taking: Reported on 12/26/2017 12/15/17   Adam PhenixArnold, James G, MD  ibuprofen (ADVIL,MOTRIN) 600 MG tablet Take 1 tablet (600 mg total) by mouth every 6 (six) hours as needed (mild pain). Patient not taking: Reported on 12/26/2017 12/15/17   Adam PhenixArnold, James G, MD  oxyCODONE-acetaminophen (PERCOCET/ROXICET) 5-325 MG tablet  Take 1-2 tablets by mouth every 4 (four) hours as needed (moderate to severe pain (when tolerating fluids)). Patient not taking: Reported on 12/26/2017 12/15/17   Adam Phenix, MD    Allergies Patient has no known allergies.  Family History  Problem Relation Age of Onset  . Hypertension Mother   . Diabetes Mother   . Cancer Mother        BREAST  . Stroke Mother   . Seizures Mother   . Asthma Daughter   . Hypertension Maternal Grandmother   . Diabetes Maternal Grandmother     . Cancer Maternal Grandmother        BONE CANCER  . Other Neg Hx     Social History Social History   Tobacco Use  . Smoking status: Never Smoker  . Smokeless tobacco: Never Used  Substance Use Topics  . Alcohol use: No    Comment: socially but none with pregnancy  . Drug use: No    Review of Systems  Constitutional: No fever/chills Eyes: No visual changes. ENT: No sore throat. Cardiovascular: Denies chest pain. Respiratory: Denies shortness of breath. Gastrointestinal:   No diarrhea.  No constipation. Genitourinary: Also with burning with urination. Musculoskeletal: Negative for back pain. Skin: Negative for rash. Neurological: Negative for headaches, focal weakness or numbness.   ____________________________________________   PHYSICAL EXAM:  VITAL SIGNS: ED Triage Vitals  Enc Vitals Group     BP 12/26/17 0851 139/69     Pulse Rate 12/26/17 0850 81     Resp 12/26/17 0850 17     Temp 12/26/17 0850 98.2 F (36.8 C)     Temp Source 12/26/17 0850 Oral     SpO2 12/26/17 0850 98 %     Weight 12/26/17 0851 220 lb (99.8 kg)     Height 12/26/17 0851 5\' 6"  (1.676 m)     Head Circumference --      Peak Flow --      Pain Score 12/26/17 0851 9     Pain Loc --      Pain Edu? --      Excl. in GC? --     Constitutional: Alert and oriented. Well appearing and in no acute distress. Eyes: Conjunctivae are normal.  Head: Atraumatic. Nose: No congestion/rhinnorhea. Mouth/Throat: Mucous membranes are moist.  Neck: No stridor.   Cardiovascular: Normal rate, regular rhythm. Grossly normal heart sounds.  Good peripheral circulation. Respiratory: Normal respiratory effort.  No retractions. Lungs CTAB. Gastrointestinal: Soft with mild to moderate tenderness to palpation across the lower abdomen. No distention. No CVA tenderness. Genitourinary: External exam without any lesions.  Speculum exam with small amount of brown blood at the cervix but without any pooling.  No active  bleeding.  Cervical motion tenderness is present with moderate uterine and bilateral adnexal tenderness to palpation but without any fluctuance or masses palpated. Musculoskeletal: No lower extremity tenderness nor edema.  No joint effusions. Neurologic:  Normal speech and language. No gross focal neurologic deficits are appreciated. Skin:  Skin is warm, dry and intact. No rash noted. Psychiatric: Mood and affect are normal. Speech and behavior are normal.  ____________________________________________   LABS (all labs ordered are listed, but only abnormal results are displayed)  Labs Reviewed  WET PREP, GENITAL - Abnormal; Notable for the following components:      Result Value   WBC, Wet Prep HPF POC RARE (*)    All other components within normal limits  CBC WITH DIFFERENTIAL/PLATELET - Abnormal; Notable for  the following components:   RDW 15.0 (*)    All other components within normal limits  COMPREHENSIVE METABOLIC PANEL - Abnormal; Notable for the following components:   Calcium 8.8 (*)    All other components within normal limits  URINALYSIS, COMPLETE (UACMP) WITH MICROSCOPIC - Abnormal; Notable for the following components:   Color, Urine YELLOW (*)    APPearance CLOUDY (*)    Hgb urine dipstick LARGE (*)    Protein, ur 30 (*)    Leukocytes, UA LARGE (*)    RBC / HPF >50 (*)    Bacteria, UA RARE (*)    All other components within normal limits  CHLAMYDIA/NGC RT PCR ([facility] ONLY)  POC URINE PREG, ED  POCT PREGNANCY, URINE   ____________________________________________  EKG   ____________________________________________  RADIOLOGY  Minimal endometrial fluid at the upper uterine segment.  Otherwise normal exam ____________________________________________   PROCEDURES  Procedure(s) performed:   Procedures  Critical Care performed:   ____________________________________________   INITIAL IMPRESSION / ASSESSMENT AND PLAN / ED COURSE  Pertinent labs  & imaging results that were available during my care of the patient were reviewed by me and considered in my medical decision making (see chart for details).  Differential diagnosis includes, but is not limited to, ovarian cyst, ovarian torsion, acute appendicitis, diverticulitis, urinary tract infection/pyelonephritis, endometriosis, bowel obstruction, colitis, renal colic, gastroenteritis, hernia, fibroids, endometriosis, pregnancy related pain including ectopic pregnancy, etc. As part of my medical decision making, I reviewed the following data within the electronic MEDICAL RECORD NUMBER Notes from prior ED visits  ----------------------------------------- 1:08 PM on 12/26/2017 -----------------------------------------  Patient at this time resting without any distress.  Very reassuring wet prep as well as uterine ultrasound.  Chlamydia and gonorrhea negative.  We will treat the UTI.  No evidence of uterine infection at this time.  Chronic pain though.  I recommended to the patient to follow-up in the office with OB/GYN to make sure to schedule an appointment even if it is a month away that she is able to get the soonest appointment.  She is understanding of this plan willing to comply.  I also discussed the case Dr. Bonney Aid who agrees.  No signs of acute PID on imaging or lab results. ____________________________________________   FINAL CLINICAL IMPRESSION(S) / ED DIAGNOSES  Final diagnoses:  Pelvic pain  Pelvic pain  UTI.    NEW MEDICATIONS STARTED DURING THIS VISIT:  New Prescriptions   No medications on file     Note:  This document was prepared using Dragon voice recognition software and may include unintentional dictation errors.     Myrna Blazer, MD 12/26/17 1309

## 2017-12-26 NOTE — ED Triage Notes (Signed)
Pt c/o abd pain with N/V/D for the past month, states she has been dx with chronic PID and has been hospitalized twice for the same thing.

## 2017-12-28 LAB — URINE CULTURE

## 2017-12-31 ENCOUNTER — Emergency Department (HOSPITAL_COMMUNITY): Payer: Medicaid Other

## 2017-12-31 ENCOUNTER — Encounter (HOSPITAL_COMMUNITY): Payer: Self-pay | Admitting: Emergency Medicine

## 2017-12-31 ENCOUNTER — Other Ambulatory Visit: Payer: Self-pay

## 2017-12-31 ENCOUNTER — Emergency Department (HOSPITAL_COMMUNITY)
Admission: EM | Admit: 2017-12-31 | Discharge: 2017-12-31 | Disposition: A | Discharge status: Home / Self Care | Payer: Medicaid Other | Attending: Emergency Medicine | Admitting: Emergency Medicine

## 2017-12-31 DIAGNOSIS — R102 Pelvic and perineal pain: Secondary | ICD-10-CM | POA: Diagnosis present

## 2017-12-31 DIAGNOSIS — N72 Inflammatory disease of cervix uteri: Secondary | ICD-10-CM | POA: Insufficient documentation

## 2017-12-31 DIAGNOSIS — N898 Other specified noninflammatory disorders of vagina: Secondary | ICD-10-CM | POA: Insufficient documentation

## 2017-12-31 LAB — I-STAT BETA HCG BLOOD, ED (MC, WL, AP ONLY): I-stat hCG, quantitative: <5 m[IU]/mL (ref <5)

## 2017-12-31 LAB — BASIC METABOLIC PANEL
Anion gap: 9 (ref 5–15)
BUN: 12 mg/dL (ref 6–20)
CO2: 22 mmol/L (ref 22–32)
Calcium: 9.1 mg/dL (ref 8.9–10.3)
Chloride: 107 mmol/L (ref 101–111)
Creatinine, Ser: 0.79 mg/dL (ref 0.44–1.00)
GFR calc Af Amer: >60 mL/min (ref >60)
GFR calc non Af Amer: >60 mL/min (ref >60)
Glucose, Bld: 108 mg/dL — ABNORMAL HIGH (ref 65–99)
Potassium: 3.7 mmol/L (ref 3.5–5.1)
Sodium: 138 mmol/L (ref 135–145)

## 2017-12-31 LAB — CBC
HCT: 38 % (ref 36.0–46.0)
Hemoglobin: 12.2 g/dL (ref 12.0–15.0)
MCH: 26.1 pg (ref 26.0–34.0)
MCHC: 32.1 g/dL (ref 30.0–36.0)
MCV: 81.4 fL (ref 78.0–100.0)
Platelets: 175 10*3/uL (ref 150–400)
RBC: 4.67 MIL/uL (ref 3.87–5.11)
RDW: 14.3 % (ref 11.5–15.5)
WBC: 5.3 10*3/uL (ref 4.0–10.5)

## 2017-12-31 LAB — WET PREP, GENITAL
Clue Cells Wet Prep HPF POC: NONE SEEN
Sperm: NONE SEEN
Trich, Wet Prep: NONE SEEN
WBC, Wet Prep HPF POC: NONE SEEN
Yeast Wet Prep HPF POC: NONE SEEN

## 2017-12-31 LAB — URINALYSIS, ROUTINE W REFLEX MICROSCOPIC
Bacteria, UA: NONE SEEN
Bilirubin Urine: NEGATIVE
Glucose, UA: NEGATIVE mg/dL
Ketones, ur: NEGATIVE mg/dL
Nitrite: NEGATIVE
Protein, ur: NEGATIVE mg/dL
RBC / HPF: >50 RBC/hpf — ABNORMAL HIGH (ref 0–5)
Specific Gravity, Urine: 1.016 (ref 1.005–1.030)
pH: 7 (ref 5.0–8.0)

## 2017-12-31 MED ORDER — DEXTROSE 5 % IV SOLN
1000.0000 mg | Freq: Once | INTRAVENOUS | Status: AC
  Administered 2017-12-31: 1000 mg via INTRAVENOUS
  Filled 2017-12-31: qty 1000

## 2017-12-31 MED ORDER — FENTANYL CITRATE (PF) 100 MCG/2ML IJ SOLN: 50.0000 ug | Freq: Once | INTRAMUSCULAR | Status: DC

## 2017-12-31 MED ORDER — AZITHROMYCIN 500 MG PO TABS: 1000.0000 mg | ORAL_TABLET | Freq: Every day | ORAL | 0 refills | Status: DC

## 2017-12-31 MED ORDER — MELOXICAM 15 MG PO TABS: 15.0000 mg | ORAL_TABLET | Freq: Every day | ORAL | 0 refills | Status: DC

## 2017-12-31 MED ORDER — ONDANSETRON HCL 4 MG PO TABS: 4.0000 mg | ORAL_TABLET | Freq: Four times a day (QID) | ORAL | 0 refills | Status: DC | PRN

## 2017-12-31 MED ORDER — TRAMADOL HCL 50 MG PO TABS: 50.0000 mg | ORAL_TABLET | Freq: Four times a day (QID) | ORAL | 0 refills | Status: DC | PRN

## 2017-12-31 MED ORDER — ONDANSETRON HCL 4 MG/2ML IJ SOLN
4.0000 mg | Freq: Once | INTRAMUSCULAR | Status: AC
Start: 2017-12-31 — End: 2017-12-31
  Administered 2017-12-31: 4 mg via INTRAVENOUS
  Filled 2017-12-31: qty 2

## 2017-12-31 MED ORDER — SODIUM CHLORIDE 0.9 % IV SOLN
1.0000 g | Freq: Once | INTRAVENOUS | Status: AC
  Administered 2017-12-31: 1 g via INTRAVENOUS
  Filled 2017-12-31: qty 10

## 2017-12-31 MED ORDER — HYDROMORPHONE HCL 1 MG/ML IJ SOLN
0.5000 mg | Freq: Once | INTRAMUSCULAR | Status: AC
Start: 2017-12-31 — End: 2017-12-31
  Administered 2017-12-31: 0.5 mg via INTRAVENOUS
  Filled 2017-12-31: qty 1

## 2017-12-31 NOTE — ED Notes (Signed)
Pt discharged from ED; instructions provided and scripts given; Pt encouraged to return to ED if symptoms worsen and to f/u with PCP; Pt verbalized understanding of all instructions 

## 2017-12-31 NOTE — ED Triage Notes (Addendum)
Pt reports hx of pelvic inflammatory disease, states she has had ongoing lower abd and pelvic pain, is now passing large clots the size of a quarter. Also reports pain with urination and brown discharge with foul odor

## 2017-12-31 NOTE — Discharge Instructions (Addendum)
YOU NEED TO REPEAT A DOSE OF AZITHROMYCIN IN 1 WEEK ON Saturday, 01/07/2017. AVOID ANY SEXUAL INTERCOURSE.

## 2017-12-31 NOTE — ED Provider Notes (Signed)
[facility] Provider Note   CSN: [id number] Arrival date & time: 12/31/17  1011     History   Chief Complaint Chief Complaint  Patient presents with  . Pelvic Pain    HPI Chloe Rodgers is a 27 y.o. female [phone] with multiple treatments For PID starting in February of 2019. The patient was initially treated for chlamydia PID by her PCP back in February 2019.   patient states that she had an abortion back in March of this year and since then has had recurrent PID and has had to be hospitalized twice.  The patient was [facility]2017 with a diagnosis of chronic PID where she received Mefoxin and doxycycline.  Patient states that over the past week she has had worsening pelvic pain, foul vaginal discharge, clotting from her vagina similar to her previous episodes of PID.  She states that she was febrile to 103.1 last night.  She took Tylenol prior to arrival.  She complains of severe pain.  Pain is her hip first thing this can last the last pain doctor in 1 you this is Alvester MorinAbigail Rodgers 1 of the PAs of calling you about a patient 27 year old female she [facility] but she has had recurrent and now is a diagnosis of chronic and was treated there with toxin Doxy over the past and has had increased pelvic pain is a discharge letter and has purulent discharge coming out of the cervix today and I do not have any like lab work or anything back yet but given her complex back the not necessarily acting going to 100 and had to give her pain meds because she could barely tell my exam she is I think her issue was that she was not able to tolerate the outpatient course and was vomiting and could not keep Doxy down but if you I will go ahead and order the ultrasound if you want to just review her chart and I will call back if there is anything on it yet right sure okay she she was febrile to 103 last night I got okay no  problem give her azithromycin that very good all do it thank you so much she said no problem she needs her uterus taken out home care she is [age] and a 12 procedures can get a do that I  HPI  Past Medical History:  Diagnosis Date  . Anemia   . Anxiety   . Chlamydia   . Complication of anesthesia    ??, seizure with wisdom teeth  . Depression    not on meds, sees a therapist  . Infection    UTI  . Kidney stone    kidney stones  . Migraine   . PID (acute pelvic inflammatory disease) 2014  . PONV (postoperative nausea and vomiting)   . Psoriasis   . Seizures [facility](H[facility])    [place]    Patient Active Problem List   Diagnosis Date Noted  . Chronic PID (chronic pelvic inflammatory disease) 12/13/2017  . Pelvic inflammatory disease 09/29/2017  . Status post repeat low transverse cesarean section 03/30/2017  . Depression affecting pregnancy in second trimester, antepartum 01/05/2017  . Supervision of normal pregnancy 10/06/2016  . Chlamydia infection affecting pregnancy in first trimester 08/23/2016  . Pelvic inflammatory disease (PID) 02/21/2013  . Migraines 07/24/2012  . Previous cesarean delivery, antepartum condition or complication 03/30/2012  . Obese 03/30/2012  . Seizure  disorder (HCC) 02/03/2012  . Family history of breast cancer in mother 02/03/2012    Past Surgical History:  Procedure Laterality Date  . CESAREAN SECTION    . CESAREAN SECTION  06/06/2012   Procedure: CESAREAN SECTION;  Surgeon: Adam Phenix, MD;  Location: [facility];  Service: Obstetrics;  Laterality: N/A;  . CESAREAN SECTION N/A 03/30/2017   Procedure: REPEAT CESAREAN SECTION;  Surgeon: Lazaro Arms, MD;  Location: [facility] BIRTHING SUITES;  Service: Obstetrics;  Laterality: N/A;  . SKIN GRAFT     off abd, onto arm  . WISDOM TOOTH EXTRACTION       OB History    Gravida  5   Para  3   Term  3   Preterm  0   AB  2   Living  3     SAB  0   TAB  1   Ectopic  0   Multiple  0     Live Births  3            Home Medications    Prior to Admission medications   Medication Sig Start Date End Date Taking? Authorizing Provider  cephALEXin (KEFLEX) 500 MG capsule Take 1 capsule (500 mg total) by mouth 3 (three) times daily for 10 days. 12/26/17 01/05/18  Schaevitz, Myra Rude, MD  doxycycline (VIBRA-TABS) 100 MG tablet Take 1 tablet (100 mg total) by mouth every 12 (twelve) hours. Patient not taking: Reported on 12/26/2017 12/15/17   Adam Phenix, MD  ibuprofen (ADVIL,MOTRIN) 600 MG tablet Take 1 tablet (600 mg total) by mouth every 6 (six) hours as needed (mild pain). Patient not taking: Reported on 12/26/2017 12/15/17   Adam Phenix, MD  ondansetron (ZOFRAN) 4 MG tablet Take 1 tablet (4 mg total) by mouth every 6 (six) hours as needed for nausea. 12/15/17   Adam Phenix, MD  oxyCODONE-acetaminophen (PERCOCET/ROXICET) 5-325 MG tablet Take 1-2 tablets by mouth every 4 (four) hours as needed (moderate to severe pain (when tolerating fluids)). Patient not taking: Reported on 12/26/2017 12/15/17   Adam Phenix, MD    Family History Family History  Problem Relation Age of Onset  . Hypertension Mother   . Diabetes Mother   . Cancer Mother        BREAST  . Stroke Mother   . Seizures Mother   . Asthma Daughter   . Hypertension Maternal Grandmother   . Diabetes Maternal Grandmother   . Cancer Maternal Grandmother        BONE CANCER  . Other Neg Hx     Social History Social History   Tobacco Use  . Smoking status: Never Smoker  . Smokeless tobacco: Never Used  Substance Use Topics  . Alcohol use: No    Comment: socially but none with pregnancy  . Drug use: No     Allergies   Patient has no known allergies.   Review of Systems Review of Systems Ten systems reviewed and are negative for acute change, except as noted in the HPI.    Physical Exam Updated Vital Signs BP 125/88 (BP Location: Right Arm)   Pulse 75   Temp 97.7 F (36.5 C) (Oral)    Resp 20   SpO2 100%   Physical Exam  Constitutional: She is oriented to person, place, and time. She appears well-developed and well-nourished. No distress.  HENT:  Head: Normocephalic and atraumatic.  Eyes: Conjunctivae are normal. No scleral icterus.  Neck: Normal range  of motion.  Cardiovascular: Normal rate, regular rhythm and normal heart sounds. Exam reveals no gallop and no friction rub.  No murmur heard. Pulmonary/Chest: Effort normal and breath sounds normal. No respiratory distress.  Abdominal: Soft. Bowel sounds are normal. She exhibits no distension and no mass. There is no tenderness. There is no guarding.  Genitourinary:  Genitourinary Comments: Pelvic exam: VULVA: normal appearing vulva with no masses, tenderness or lesions, VAGINA: normal appearing vagina with normal color and discharge, no lesions, CERVIX: cervical discharge present - green, grey, mucoid and thick, cervical motion tenderness present, ADNEXA: normal adnexa in size, nontender and no masses, exam limited by pain.   Neurological: She is alert and oriented to person, place, and time.  Skin: Skin is warm and dry. She is not diaphoretic.  Psychiatric: Her behavior is normal.  Nursing note and vitals reviewed.    ED Treatments / Results  Labs (all labs ordered are listed, but only abnormal results are displayed) Labs Reviewed  WET PREP, GENITAL  URINALYSIS, ROUTINE W REFLEX MICROSCOPIC  RPR  HIV ANTIBODY (ROUTINE TESTING)  I-STAT BETA HCG BLOOD, ED ([facility], [facility], [facility] ONLY)  GC/CHLAMYDIA PROBE AMP ([place]) NOT AT [facility]    EKG None  Radiology No results found.  Procedures Procedures (including critical care time)  Medications Ordered in ED Medications - No data to display   Initial Impression / Assessment and Plan / ED Course  I have reviewed the triage vital signs and the nursing notes.  Pertinent labs & imaging results that were available during my care of the patient were reviewed by me and  considered in my medical decision making (see chart for details).     Patient with chronic recurrent episodes of PID.  I discussed case with Dr. Macon Large of the faculty practice at [facility] who recommends treating with IV a Zithromax and Rocephin here.  Her ultrasound is negative and shows no tubo-ovarian abscess therefore does not need admission or emergent surgery.  The patient will have one repeat dose of a azithromycin as advised by OB/GYN.  She has a follow-up appointment on January 17, 2018 and is advised to go to the [facility] emergency department should she have any complications prior to her appointment.  Final Clinical Impressions(s) / ED Diagnoses   Final diagnoses:  Cervicitis    ED Discharge Orders    None       Arthor Captain, PA-C 12/31/17 1515    Benjiman Core, MD 12/31/17 [phone]

## 2017-12-31 NOTE — ED Notes (Signed)
Patient transported to Ultrasound 

## 2017-12-31 NOTE — Progress Notes (Signed)
Faculty Practice OB/GYN Attending Note  I was called by Arthor CaptainAbigail Harris, PA-C about this patient with recent admission for PID, now back to ER with report of fevers to 103 at home, pelvic pain and abnormal discharge.  On evaluation, she is afebrile, not pregnant, WBC is 5.3, no WBCs seen on wet prep, GC/Chlam pending.  I recommended repeat treatment with Ceftriaxone 1 g IV and Azithromycin 1 g IV, analgesia as needed (Toradol preferred) and pelvic ultrasound.  If TOA is seen on ultrasound, she will need admission [facility] for further IV therapy and management. If no TOA is seen, patient can be discharged with plan to to follow up as outpatient with high dose NSAIDs +/- low potency narcotics if indicated for pain.  She should also be advised to come to [facility] for any further evaluation or worsening symptoms.  Of note, patient is already scheduled for an appointment on [facility]17/2018 [facility].   Jaynie CollinsUGONNA  Starlena Beil, MD, FACOG Obstetrician & Gynecologist, [facility] for [facility], [facility]

## 2018-01-01 LAB — RPR: RPR Ser Ql: NONREACTIVE

## 2018-01-01 LAB — HIV ANTIBODY (ROUTINE TESTING W REFLEX): HIV Screen 4th Generation wRfx: NONREACTIVE

## 2018-01-02 LAB — GC/CHLAMYDIA PROBE AMP (~~LOC~~) NOT AT ARMC
Chlamydia: NEGATIVE
Neisseria Gonorrhea: NEGATIVE

## 2018-01-07 ENCOUNTER — Encounter (HOSPITAL_COMMUNITY): Payer: Self-pay

## 2018-01-07 ENCOUNTER — Other Ambulatory Visit: Payer: Self-pay

## 2018-01-07 ENCOUNTER — Inpatient Hospital Stay (HOSPITAL_COMMUNITY)
Admission: AD | Admit: 2018-01-07 | Discharge: 2018-01-07 | Disposition: A | Discharge status: Home / Self Care | Payer: Medicaid Other | Source: Ambulatory Visit | Attending: Obstetrics and Gynecology | Admitting: Obstetrics and Gynecology

## 2018-01-07 DIAGNOSIS — R102 Pelvic and perineal pain: Secondary | ICD-10-CM | POA: Insufficient documentation

## 2018-01-07 DIAGNOSIS — G8929 Other chronic pain: Secondary | ICD-10-CM | POA: Diagnosis not present

## 2018-01-07 DIAGNOSIS — Z825 Family history of asthma and other chronic lower respiratory diseases: Secondary | ICD-10-CM | POA: Insufficient documentation

## 2018-01-07 DIAGNOSIS — Z808 Family history of malignant neoplasm of other organs or systems: Secondary | ICD-10-CM | POA: Diagnosis not present

## 2018-01-07 DIAGNOSIS — R1030 Lower abdominal pain, unspecified: Secondary | ICD-10-CM | POA: Diagnosis present

## 2018-01-07 DIAGNOSIS — Z9889 Other specified postprocedural states: Secondary | ICD-10-CM | POA: Diagnosis not present

## 2018-01-07 DIAGNOSIS — R1032 Left lower quadrant pain: Secondary | ICD-10-CM

## 2018-01-07 DIAGNOSIS — Z803 Family history of malignant neoplasm of breast: Secondary | ICD-10-CM | POA: Insufficient documentation

## 2018-01-07 DIAGNOSIS — Z833 Family history of diabetes mellitus: Secondary | ICD-10-CM | POA: Insufficient documentation

## 2018-01-07 DIAGNOSIS — N72 Inflammatory disease of cervix uteri: Secondary | ICD-10-CM | POA: Diagnosis not present

## 2018-01-07 DIAGNOSIS — R1031 Right lower quadrant pain: Secondary | ICD-10-CM

## 2018-01-07 DIAGNOSIS — Z8249 Family history of ischemic heart disease and other diseases of the circulatory system: Secondary | ICD-10-CM | POA: Diagnosis not present

## 2018-01-07 DIAGNOSIS — Z823 Family history of stroke: Secondary | ICD-10-CM | POA: Insufficient documentation

## 2018-01-07 DIAGNOSIS — Z87442 Personal history of urinary calculi: Secondary | ICD-10-CM | POA: Diagnosis not present

## 2018-01-07 LAB — CBC WITH DIFFERENTIAL/PLATELET
Basophils Absolute: 0 10*3/uL (ref 0.0–0.1)
Basophils Relative: 0 %
Eosinophils Absolute: 0.1 10*3/uL (ref 0.0–0.7)
Eosinophils Relative: 2 %
HCT: 36.3 % (ref 36.0–46.0)
Hemoglobin: 12 g/dL (ref 12.0–15.0)
Lymphocytes Relative: 26 %
Lymphs Abs: 1.5 10*3/uL (ref 0.7–4.0)
MCH: 26.9 pg (ref 26.0–34.0)
MCHC: 33.1 g/dL (ref 30.0–36.0)
MCV: 81.4 fL (ref 78.0–100.0)
Monocytes Absolute: 0.5 10*3/uL (ref 0.1–1.0)
Monocytes Relative: 8 %
Neutro Abs: 3.7 10*3/uL (ref 1.7–7.7)
Neutrophils Relative %: 64 %
Platelets: 158 10*3/uL (ref 150–400)
RBC: 4.46 MIL/uL (ref 3.87–5.11)
RDW: 14.7 % (ref 11.5–15.5)
WBC: 5.8 10*3/uL (ref 4.0–10.5)

## 2018-01-07 LAB — WET PREP, GENITAL
Clue Cells Wet Prep HPF POC: NONE SEEN
Sperm: NONE SEEN
Trich, Wet Prep: NONE SEEN
Yeast Wet Prep HPF POC: NONE SEEN

## 2018-01-07 LAB — URINALYSIS, ROUTINE W REFLEX MICROSCOPIC
Bacteria, UA: NONE SEEN
Bilirubin Urine: NEGATIVE
Glucose, UA: NEGATIVE mg/dL
Ketones, ur: NEGATIVE mg/dL
Nitrite: NEGATIVE
Protein, ur: NEGATIVE mg/dL
Specific Gravity, Urine: 1.023 (ref 1.005–1.030)
pH: 6 (ref 5.0–8.0)

## 2018-01-07 LAB — POCT PREGNANCY, URINE: Preg Test, Ur: NEGATIVE

## 2018-01-07 MED ORDER — CEFTRIAXONE SODIUM 1 G IJ SOLR
2.0000 g | Freq: Once | INTRAMUSCULAR | Status: AC
  Administered 2018-01-07: 2 g via INTRAMUSCULAR
  Filled 2018-01-07: qty 20

## 2018-01-07 MED ORDER — KETOROLAC TROMETHAMINE 60 MG/2ML IM SOLN
60.0000 mg | Freq: Once | INTRAMUSCULAR | Status: DC
  Filled 2018-01-07: qty 2

## 2018-01-07 NOTE — MAU Provider Note (Signed)
History   [name] is a 27 year old patient not pregnant and today with chronic abdominal pain she has a history of PID she was recently seen in the emergency room at [facility] on 622 had a pelvic ultrasound which was totally normal CBC which was totally normal but patient was treated first cervicitis with azithromycin and IM Rocephin.  She presents today stating the pain is no better and that she has had nausea secondary to the antibiotics she is been placed on.  She does have a history of cesarean deliveries x2.  Medical history includes PID chlamydial infection migraines seizure disorder.  CSN: [id number]  Arrival date & time 01/07/18  1821   None     Chief Complaint  Patient presents with  . Abdominal Pain  . Pelvic Pain    HPI  Past Medical History:  Diagnosis Date  . Anemia   . Anxiety   . Chlamydia   . Complication of anesthesia    ??, seizure with wisdom teeth  . Depression    not on meds, sees a therapist  . Infection    UTI  . Kidney stone    kidney stones  . Migraine   . PID (acute pelvic inflammatory disease) 2014  . PONV (postoperative nausea and vomiting)   . Psoriasis   . Seizures [facility])    July 2013 - [place]    Past Surgical History:  Procedure Laterality Date  . CESAREAN SECTION    . CESAREAN SECTION  06/06/2012   Procedure: CESAREAN SECTION;  Surgeon: Adam Phenix, MD;  Location: [facility];  Service: Obstetrics;  Laterality: N/A;  . CESAREAN SECTION N/A 03/30/2017   Procedure: REPEAT CESAREAN SECTION;  Surgeon: Lazaro Arms, MD;  [facility];  Service: Obstetrics;  Laterality: N/A;  . SKIN GRAFT     off abd, onto arm  . WISDOM TOOTH EXTRACTION      Family History  Problem Relation Age of Onset  . Hypertension Mother   . Diabetes Mother   . Cancer Mother        BREAST  . Stroke Mother   . Seizures Mother   . Asthma Daughter   . Hypertension Maternal Grandmother   . Diabetes Maternal Grandmother   . Cancer Maternal  Grandmother        BONE CANCER  . Other Neg Hx     Social History   Tobacco Use  . Smoking status: Never Smoker  . Smokeless tobacco: Never Used  Substance Use Topics  . Alcohol use: No    Comment: socially but none with pregnancy  . Drug use: No    OB History    Gravida  5   Para  3   Term  3   Preterm  0   AB  2   Living  3     SAB  0   TAB  1   Ectopic  0   Multiple  0   Live Births  3           Review of Systems  Constitutional: Negative.   HENT: Negative.   Eyes: Negative.   Respiratory: Negative.   Cardiovascular: Negative.   Gastrointestinal: Positive for abdominal pain and nausea.  Endocrine: Negative.   Genitourinary: Negative.   Musculoskeletal: Negative.   Skin: Negative.   Allergic/Immunologic: Negative.   Neurological: Negative.   Hematological: Negative.   Psychiatric/Behavioral: Negative.     Allergies  Patient has no known allergies.  Home Medications    BP 110/84 (BP Location: Right Arm)   Pulse (!) 104   Temp 98.1 F (36.7 C) (Oral)   Resp 19   Wt 235 lb 1.9 oz (106.6 kg)   SpO2 98% Comment: ra  BMI 37.95 kg/m   Physical Exam  Constitutional: She is oriented to person, place, and time. She appears well-developed and well-nourished.  HENT:  Head: Normocephalic.  Cardiovascular: Normal rate, regular rhythm, normal heart sounds and intact distal pulses.  Pulmonary/Chest: Effort normal and breath sounds normal.  Abdominal: Soft. Normal appearance and bowel sounds are normal.  Genitourinary: Vagina normal.  Neurological: She is alert and oriented to person, place, and time.  Skin: Skin is warm and dry.  Psychiatric: She has a normal mood and affect. Her behavior is normal.    MAU Course  Procedures (including critical care time)  Labs Reviewed  WET PREP, GENITAL  URINALYSIS, ROUTINE W REFLEX MICROSCOPIC  CBC WITH DIFFERENTIAL/PLATELET  POCT PREGNANCY, URINE  GC/CHLAMYDIA PROBE AMP ([place]) NOT AT [facility]    No results found.   1. Chronic bilateral lower abdominal pain       MDM  Vital signs are stable abdomen is soft nontender.  Patient is requesting pain medication on admission to unit.  Cultures done and wet prep neg.  CBC WNL.  Patient declined any Toradol his pain management.  Since wet prep and labs are within normal limits aborted discharge her home to follow-up with the GYN provider of her choice.

## 2018-01-07 NOTE — MAU Note (Addendum)
+  lower abdominal pain Sharp in nature [facility] a couple days ago for the pain; was antibiotics Rating pain 10/10 Has not taken anything for the pain  +pelvic pain  10/10 Constant pressure  +N/V States unable to keep anything down; "throwing up blood"  +dysuria odorous

## 2018-01-07 NOTE — Discharge Instructions (Signed)

## 2018-01-09 LAB — GC/CHLAMYDIA PROBE AMP (~~LOC~~) NOT AT ARMC
Chlamydia: NEGATIVE
Neisseria Gonorrhea: NEGATIVE

## 2018-01-17 ENCOUNTER — Encounter: Payer: Self-pay | Admitting: Obstetrics & Gynecology

## 2018-01-17 ENCOUNTER — Ambulatory Visit (INDEPENDENT_AMBULATORY_CARE_PROVIDER_SITE_OTHER): Payer: Medicaid Other | Admitting: Obstetrics & Gynecology

## 2018-01-17 ENCOUNTER — Encounter: Payer: Self-pay | Admitting: General Practice

## 2018-01-17 VITALS — BP 128/85 | HR 79 | Ht 66.0 in | Wt 234.0 lb

## 2018-01-17 DIAGNOSIS — R102 Pelvic and perineal pain: Secondary | ICD-10-CM

## 2018-01-17 DIAGNOSIS — B373 Candidiasis of vulva and vagina: Secondary | ICD-10-CM

## 2018-01-17 DIAGNOSIS — B3731 Acute candidiasis of vulva and vagina: Secondary | ICD-10-CM

## 2018-01-17 DIAGNOSIS — Z3042 Encounter for surveillance of injectable contraceptive: Secondary | ICD-10-CM

## 2018-01-17 MED ORDER — GABAPENTIN 300 MG PO CAPS: 300.0000 mg | ORAL_CAPSULE | Freq: Three times a day (TID) | ORAL | 1 refills | Status: DC

## 2018-01-17 MED ORDER — MEDROXYPROGESTERONE ACETATE 150 MG/ML IM SUSP
150.0000 mg | Freq: Once | INTRAMUSCULAR | Status: AC
  Administered 2018-01-17: 150 mg via INTRAMUSCULAR

## 2018-01-17 MED ORDER — CLOTRIMAZOLE 1 % EX CREA: 1.0000 "application " | TOPICAL_CREAM | Freq: Two times a day (BID) | CUTANEOUS | 0 refills | Status: DC

## 2018-01-17 NOTE — Patient Instructions (Signed)
Pelvic Pain, Female Pelvic pain is pain in your lower abdomen, below your belly button and between your hips. The pain may start suddenly (acute), keep coming back (recurring), or last a long time (chronic). Pelvic pain that lasts longer than six months is considered chronic. Pelvic pain may affect your:  Reproductive organs.  Urinary system.  Digestive tract.  Musculoskeletal system.  There are many potential causes of pelvic pain. Sometimes, the pain can be a result of digestive or urinary conditions, strained muscles or ligaments, or even reproductive conditions. Sometimes the cause of pelvic pain is not known. Follow these instructions at home:  Take over-the-counter and prescription medicines only as told by your health care provider.  Rest as told by your health care provider.  Do not have sex it if hurts.  Keep a journal of your pelvic pain. Write down: ? When the pain started. ? Where the pain is located. ? What seems to make the pain better or worse, such as food or your menstrual cycle. ? Any symptoms you have along with the pain.  Keep all follow-up visits as told by your health care provider. This is important. Contact a health care provider if:  Medicine does not help your pain.  Your pain comes back.  You have new symptoms.  You have abnormal vaginal discharge or bleeding, including bleeding after menopause.  You have a fever or chills.  You are constipated.  You have blood in your urine or stool.  You have foul-smelling urine.  You feel weak or lightheaded. Get help right away if:  You have sudden severe pain.  Your pain gets steadily worse.  You have severe pain along with fever, nausea, vomiting, or excessive sweating.  You lose consciousness. This information is not intended to replace advice given to you by your health care provider. Make sure you discuss any questions you have with your health care provider. Document Released: 05/25/2004  Document Revised: 07/23/2015 Document Reviewed: 04/18/2015 Elsevier Interactive Patient Education  2018 [employer]  

## 2018-01-17 NOTE — Progress Notes (Signed)
History:  27 y.o. [id number] here today for hosp f/u for eval of [place]orn[place] pelvic pain.   Pt reports daily pelvicpain all day every day beginning in Feb 2019. She reports having a termination at that time. Pt reports sx 1 week after the termination.  Pt reports some guilt following the termination. She reports 'I killed a baby'.  She reports discharge that is whitish and brownish since Feb.   Pt did get a Depo provera 3 months prev. Did not get the 2nd shot.  Pt reports itching and burning since the Depo Provera. {Pt was admitted PID x2.  She had a + chlamydia in 1/2019n but, has had neg cx since that time. She was last sexually active in Feb. The pain is 8/10.  She also reports itching in the vulvar area. She reports that it feels worse lately.   Pt is bottle feeding her 9 month son.   The following portions of the patient's history were reviewed and updated as appropriate: allergies, current medications, past family history, past medical history, past social history, past surgical history and problem list.  Review of Systems:  Pertinent items are noted in HPI.    Objective:  Physical Exam.BP 128/85   Pulse 79   Ht 5\' 6"  (1.676 m)   Wt 234 lb (106.1 kg)   Breastfeeding? No   BMI 37.77 kg/m   CONSTITUTIONAL: Well-developed, well-nourished female in no acute distress.  HENT:  Normocephalic, atraumatic EYES: Conjunctivae and EOM are normal. No scleral icterus.  NECK: Normal range of motion SKIN: Skin is warm and dry. No rash noted. Not diaphoretic.No pallor. NEUROLGIC: Alert and oriented to person, place, and time. Normal coordination.  Lungs CTA CV RRR Abd: obese, diffusely tender bilaterally,no rebound; no guarding; nondistended Pelvic:external genitalia- erythema noted and white discharge; normal appearing vaginal mucosa and cervix.  Yellowish discharge.  Small uterus, no other palpable masses, no uterine or adnexal tenderness. NO CMT  Labs and Imaging [place] Transvaginal Non-ob  Result  Date: 12/31/2017 CLINICAL DATA:  27 year old with recurrent pelvic inflammatory disease. Pelvic pain for 3 months. EXAM: TRANSABDOMINAL AND TRANSVAGINAL ULTRASOUND OF PELVIS DOPPLER ULTRASOUND OF OVARIES TECHNIQUE: Both transabdominal and transvaginal ultrasound examinations of the pelvis were performed. Transabdominal technique was performed for global imaging of the pelvis including uterus, ovaries, adnexal regions, and pelvic cul-de-sac. It was necessary to proceed with endovaginal exam following the transabdominal exam to visualize the ovaries. Color and duplex Doppler ultrasound was utilized to evaluate blood flow to the ovaries. COMPARISON:  12/26/2017 FINDINGS: Uterus Measurements: 7.7 x 3.9 x 5.3 cm. No fibroids or other mass visualized. Small amount of fluid within the cervix. Endometrium Thickness: 0.2 cm.  No focal abnormality visualized. Right ovary Measurements: 2.5 x 1.6 x 2.0 cm. Normal appearance/no adnexal mass. Left ovary Measurements: 3.6 x 1.9 x 1.3 cm. Normal appearance/no adnexal mass. Pulsed Doppler evaluation of both ovaries demonstrates normal low-resistance arterial and venous waveforms. Other findings No abnormal free fluid. IMPRESSION: Small amount of fluid within the cervix. Otherwise, normal examination. Normal appearance of the ovaries.  No evidence for ovarian torsion. Electronically Signed   By: Richarda OverlieAdam  Henn M.D.   On: 12/31/2017 13:18   [place] Pelvis Transvanginal Non-ob (tv Only)  Result Date: 12/26/2017 CLINICAL DATA:  Pelvic pain for 3 months EXAM: TRANSABDOMINAL AND TRANSVAGINAL ULTRASOUND OF PELVIS DOPPLER ULTRASOUND OF OVARIES TECHNIQUE: Both transabdominal and transvaginal ultrasound examinations of the pelvis were performed. Transabdominal technique was performed for global imaging of the pelvis including uterus, ovaries,  adnexal regions, and pelvic cul-de-sac. Transabdominal imaging is limited by decompressed urinary bladder and poor acoustic window. It was necessary to  proceed with endovaginal exam following the transabdominal exam to visualize the endometrium and LEFT ovary. Color and duplex Doppler ultrasound was utilized to evaluate blood flow to the ovaries. COMPARISON:  CT abdomen and pelvis 12/13/2017 FINDINGS: Uterus Measurements: 7.7 x 4.1 x 6.0 cm.  Normal morphology without mass Endometrium Thickness: 4 mm maximal. Minimal endometrial fluid at upper uterine segment Right ovary Measurements: 2.8 x 1.4 x 2.4 cm. Normal morphology without mass. Internal blood flow present on color Doppler imaging. Left ovary Measurements: 2.1 x 1.6 x 1.5 cm. Normal morphology without mass. Internal blood flow present on color Doppler imaging. Pulsed Doppler evaluation of both ovaries demonstrates normal low-resistance arterial and venous waveforms. Other findings Trace free pelvic fluid.  No adnexal masses. IMPRESSION: Minimal endometrial fluid at upper uterine segment. Otherwise normal exam. Electronically Signed   By: Ulyses Southward M.D.   On: 12/26/2017 10:50   US Pelvis Complete  Result Date: 12/31/2017 CLINICAL DATA:  27 year old with recurrent pelvic inflammatory disease. Pelvic pain for 3 months. EXAM: TRANSABDOMINAL AND TRANSVAGINAL ULTRASOUND OF PELVIS DOPPLER ULTRASOUND OF OVARIES TECHNIQUE: Both transabdominal and transvaginal ultrasound examinations of the pelvis were performed. Transabdominal technique was performed for global imaging of the pelvis including uterus, ovaries, adnexal regions, and pelvic cul-de-sac. It was necessary to proceed with endovaginal exam following the transabdominal exam to visualize the ovaries. Color and duplex Doppler ultrasound was utilized to evaluate blood flow to the ovaries. COMPARISON:  12/26/2017 FINDINGS: Uterus Measurements: 7.7 x 3.9 x 5.3 cm. No fibroids or other mass visualized. Small amount of fluid within the cervix. Endometrium Thickness: 0.2 cm.  No focal abnormality visualized. Right ovary Measurements: 2.5 x 1.6 x 2.0 cm. Normal  appearance/no adnexal mass. Left ovary Measurements: 3.6 x 1.9 x 1.3 cm. Normal appearance/no adnexal mass. Pulsed Doppler evaluation of both ovaries demonstrates normal low-resistance arterial and venous waveforms. Other findings No abnormal free fluid. IMPRESSION: Small amount of fluid within the cervix. Otherwise, normal examination. Normal appearance of the ovaries.  No evidence for ovarian torsion. Electronically Signed   By: Richarda Overlie M.D.   On: 12/31/2017 13:18   US Pelvis Complete  Result Date: 12/26/2017 CLINICAL DATA:  Pelvic pain for 3 months EXAM: TRANSABDOMINAL AND TRANSVAGINAL ULTRASOUND OF PELVIS DOPPLER ULTRASOUND OF OVARIES TECHNIQUE: Both transabdominal and transvaginal ultrasound examinations of the pelvis were performed. Transabdominal technique was performed for global imaging of the pelvis including uterus, ovaries, adnexal regions, and pelvic cul-de-sac. Transabdominal imaging is limited by decompressed urinary bladder and poor acoustic window. It was necessary to proceed with endovaginal exam following the transabdominal exam to visualize the endometrium and LEFT ovary. Color and duplex Doppler ultrasound was utilized to evaluate blood flow to the ovaries. COMPARISON:  CT abdomen and pelvis 12/13/2017 FINDINGS: Uterus Measurements: 7.7 x 4.1 x 6.0 cm.  Normal morphology without mass Endometrium Thickness: 4 mm maximal. Minimal endometrial fluid at upper uterine segment Right ovary Measurements: 2.8 x 1.4 x 2.4 cm. Normal morphology without mass. Internal blood flow present on color Doppler imaging. Left ovary Measurements: 2.1 x 1.6 x 1.5 cm. Normal morphology without mass. Internal blood flow present on color Doppler imaging. Pulsed Doppler evaluation of both ovaries demonstrates normal low-resistance arterial and venous waveforms. Other findings Trace free pelvic fluid.  No adnexal masses. IMPRESSION: Minimal endometrial fluid at upper uterine segment. Otherwise normal exam.  Electronically Signed  By: Ulyses Southward M.D.   On: 12/26/2017 10:50   [place] Art/ven Flow Abd Pelv Doppler  Result Date: 12/31/2017 CLINICAL DATA:  27 year old with recurrent pelvic inflammatory disease. Pelvic pain for 3 months. EXAM: TRANSABDOMINAL AND TRANSVAGINAL ULTRASOUND OF PELVIS DOPPLER ULTRASOUND OF OVARIES TECHNIQUE: Both transabdominal and transvaginal ultrasound examinations of the pelvis were performed. Transabdominal technique was performed for global imaging of the pelvis including uterus, ovaries, adnexal regions, and pelvic cul-de-sac. It was necessary to proceed with endovaginal exam following the transabdominal exam to visualize the ovaries. Color and duplex Doppler ultrasound was utilized to evaluate blood flow to the ovaries. COMPARISON:  12/26/2017 FINDINGS: Uterus Measurements: 7.7 x 3.9 x 5.3 cm. No fibroids or other mass visualized. Small amount of fluid within the cervix. Endometrium Thickness: 0.2 cm.  No focal abnormality visualized. Right ovary Measurements: 2.5 x 1.6 x 2.0 cm. Normal appearance/no adnexal mass. Left ovary Measurements: 3.6 x 1.9 x 1.3 cm. Normal appearance/no adnexal mass. Pulsed Doppler evaluation of both ovaries demonstrates normal low-resistance arterial and venous waveforms. Other findings No abnormal free fluid. IMPRESSION: Small amount of fluid within the cervix. Otherwise, normal examination. Normal appearance of the ovaries.  No evidence for ovarian torsion. Electronically Signed   By: Richarda Overlie M.D.   On: 12/31/2017 13:18   US Pelvic Doppler (torsion R/o Or Mass Arterial Flow)  Result Date: 12/26/2017 CLINICAL DATA:  Pelvic pain for 3 months EXAM: TRANSABDOMINAL AND TRANSVAGINAL ULTRASOUND OF PELVIS DOPPLER ULTRASOUND OF OVARIES TECHNIQUE: Both transabdominal and transvaginal ultrasound examinations of the pelvis were performed. Transabdominal technique was performed for global imaging of the pelvis including uterus, ovaries, adnexal regions, and pelvic  cul-de-sac. Transabdominal imaging is limited by decompressed urinary bladder and poor acoustic window. It was necessary to proceed with endovaginal exam following the transabdominal exam to visualize the endometrium and LEFT ovary. Color and duplex Doppler ultrasound was utilized to evaluate blood flow to the ovaries. COMPARISON:  CT abdomen and pelvis 12/13/2017 FINDINGS: Uterus Measurements: 7.7 x 4.1 x 6.0 cm.  Normal morphology without mass Endometrium Thickness: 4 mm maximal. Minimal endometrial fluid at upper uterine segment Right ovary Measurements: 2.8 x 1.4 x 2.4 cm. Normal morphology without mass. Internal blood flow present on color Doppler imaging. Left ovary Measurements: 2.1 x 1.6 x 1.5 cm. Normal morphology without mass. Internal blood flow present on color Doppler imaging. Pulsed Doppler evaluation of both ovaries demonstrates normal low-resistance arterial and venous waveforms. Other findings Trace free pelvic fluid.  No adnexal masses. IMPRESSION: Minimal endometrial fluid at upper uterine segment. Otherwise normal exam. Electronically Signed   By: Ulyses Southward M.D.   On: 12/26/2017 10:50    Assessment & Plan:  Pelvic pain Yeast vaginitis  PAP smear     Gabapentin 300mg  tid  Clotrimazole externally bid until clear  Depo Provera today  F/u in 6 weeks or sooner prn  F/u PAP   Viren Lebeau L. Harraway-Smith, M.D., [place]n Core

## 2018-01-17 NOTE — Progress Notes (Signed)
[facility] per MD request.  Patient refused.

## 2018-01-17 NOTE — Progress Notes (Signed)
Patient has elevated phq9 & gad7 today- will make appt with Asher MuirJamie for next week

## 2018-01-17 NOTE — Addendum Note (Signed)
Addended by: Kathee DeltonHILLMAN, Maricia Scotti L on: 01/17/2018 12:02 PM   Modules accepted: Orders

## 2018-01-18 ENCOUNTER — Other Ambulatory Visit (HOSPITAL_COMMUNITY)
Admission: RE | Admit: 2018-01-18 | Discharge: 2018-01-18 | Disposition: A | Discharge status: Home / Self Care | Payer: Medicaid Other | Source: Ambulatory Visit | Attending: Obstetrics & Gynecology | Admitting: Obstetrics & Gynecology

## 2018-01-18 DIAGNOSIS — Z3042 Encounter for surveillance of injectable contraceptive: Secondary | ICD-10-CM | POA: Diagnosis not present

## 2018-01-18 DIAGNOSIS — R102 Pelvic and perineal pain: Secondary | ICD-10-CM | POA: Insufficient documentation

## 2018-01-18 NOTE — Addendum Note (Signed)
Addended by: Jill SideAY, Kissa Campoy L on: 01/18/2018 05:42 PM   Modules accepted: Orders

## 2018-01-20 LAB — CYTOLOGY - PAP: Diagnosis: NEGATIVE

## 2018-01-20 LAB — CERVICOVAGINAL ANCILLARY ONLY
Bacterial vaginitis: NEGATIVE
Candida vaginitis: POSITIVE — AB
Trichomonas: NEGATIVE

## 2018-01-23 ENCOUNTER — Encounter (HOSPITAL_COMMUNITY): Payer: Self-pay | Admitting: Emergency Medicine

## 2018-01-23 ENCOUNTER — Ambulatory Visit (HOSPITAL_COMMUNITY)
Admission: EM | Admit: 2018-01-23 | Discharge: 2018-01-23 | Disposition: A | Discharge status: Home / Self Care | Payer: Medicaid Other | Attending: Family Medicine | Admitting: Family Medicine

## 2018-01-23 DIAGNOSIS — Z9889 Other specified postprocedural states: Secondary | ICD-10-CM | POA: Insufficient documentation

## 2018-01-23 DIAGNOSIS — Z79899 Other long term (current) drug therapy: Secondary | ICD-10-CM | POA: Diagnosis not present

## 2018-01-23 DIAGNOSIS — N946 Dysmenorrhea, unspecified: Secondary | ICD-10-CM | POA: Insufficient documentation

## 2018-01-23 DIAGNOSIS — R102 Pelvic and perineal pain: Secondary | ICD-10-CM | POA: Insufficient documentation

## 2018-01-23 DIAGNOSIS — N3090 Cystitis, unspecified without hematuria: Secondary | ICD-10-CM | POA: Diagnosis not present

## 2018-01-23 DIAGNOSIS — G8929 Other chronic pain: Secondary | ICD-10-CM | POA: Diagnosis not present

## 2018-01-23 LAB — POCT PREGNANCY, URINE: Preg Test, Ur: NEGATIVE

## 2018-01-23 LAB — POCT URINALYSIS DIP (DEVICE)
Bilirubin Urine: NEGATIVE
Glucose, UA: NEGATIVE mg/dL
Ketones, ur: NEGATIVE mg/dL
Nitrite: NEGATIVE
Protein, ur: NEGATIVE mg/dL
Specific Gravity, Urine: 1.02 (ref 1.005–1.030)
Urobilinogen, UA: 0.2 mg/dL (ref 0.0–1.0)
pH: 6 (ref 5.0–8.0)

## 2018-01-23 MED ORDER — ONDANSETRON HCL 4 MG PO TABS: 4.0000 mg | ORAL_TABLET | Freq: Four times a day (QID) | ORAL | 0 refills | Status: DC | PRN

## 2018-01-23 MED ORDER — KETOROLAC TROMETHAMINE 60 MG/2ML IM SOLN: 60.0000 mg | Freq: Once | INTRAMUSCULAR | Status: DC

## 2018-01-23 MED ORDER — NAPROXEN 500 MG PO TABS: 500.0000 mg | ORAL_TABLET | Freq: Two times a day (BID) | ORAL | 0 refills | Status: DC

## 2018-01-23 MED ORDER — CEPHALEXIN 500 MG PO CAPS: 500.0000 mg | ORAL_CAPSULE | Freq: Four times a day (QID) | ORAL | 0 refills | Status: DC

## 2018-01-23 MED ORDER — KETOROLAC TROMETHAMINE 60 MG/2ML IM SOLN
INTRAMUSCULAR | Status: AC
  Filled 2018-01-23: qty 2

## 2018-01-23 NOTE — Discharge Instructions (Addendum)
I have offered you a shot for pain. If taken with benadryl, I believe we can control the itching.  You may choose not to take. I have given you antibiotic to take for the urinary infection.  Take 4 times a day for 5 days. Drink plenty of fluids. Take Naprosyn twice a day with food.  This is a good medicine for cramping and pelvic pain.

## 2018-01-23 NOTE — ED Provider Notes (Signed)
[facility]    CSN: [id number] Arrival date & time: 01/23/18  [id number]     History   Chief Complaint Chief Complaint  Patient presents with  . Pelvic Pain    HPI Chloe Rodgers is a 27 y.o. female.   HPI  Patient is here for pelvic pain.  She has chronic pelvic pain since February.  She is under the care of the women's clinic.  She was just seen there on 01/17/2018.  She is being treated for yeast infection.  She has been given medication,  meloxicam, Zofran, gabapentin.  She is not taking any of these.  She is here complaining of acute pain.  Dysuria and frequency.  Suprapubic pressure.  Worse than usual.  Her urinalysis is positive for leukocytes. She states she is has not had any sexual relations in September.  She is been admitted twice for PID.  She has not been found to have sexually transmitted disease. I explained her that we cannot work-up or treat her chronic pelvic pain but we can treat her acute cystitis. She was offered a shot of Toradol but states she is allergic causes "itching".  I note in her chart she is been on a variety of anti-inflammatories and has had over 10 Toradol shots over the years, with no significant reaction documented.  She is offered a Benadryl with Toradol.  She refuses. No fever or chills.  No vaginal discharge.  No nausea or vomiting.  She does have nausea when her pain is severe.   Past Medical History:  Diagnosis Date  . Anemia   . Anxiety   . Chlamydia   . Complication of anesthesia    ??, seizure with wisdom teeth  . Depression    not on meds, sees a therapist  . Infection    UTI  . Kidney stone    kidney stones  . Migraine   . PID (acute pelvic inflammatory disease) 2014  . PONV (postoperative nausea and vomiting)   . Psoriasis   . Seizures [facility](HCC)    [place]    Patient Active Problem List   Diagnosis Date Noted  . Chronic PID (chronic pelvic inflammatory disease) 12/13/2017  . Pelvic inflammatory disease  09/29/2017  . Status post repeat low transverse cesarean section 03/30/2017  . Depression affecting pregnancy in second trimester, antepartum 01/05/2017  . Supervision of normal pregnancy 10/06/2016  . Chlamydia infection affecting pregnancy in first trimester 08/23/2016  . Pelvic inflammatory disease (PID) 02/21/2013  . Migraines 07/24/2012  . Previous cesarean delivery, antepartum condition or complication 03/30/2012  . Obese 03/30/2012  . Seizure disorder (HCC) 02/03/2012  . Family history of breast cancer in mother 02/03/2012    Past Surgical History:  Procedure Laterality Date  . CESAREAN SECTION    . CESAREAN SECTION  06/06/2012   Procedure: CESAREAN SECTION;  Surgeon: Adam PhenixJames G Arnold, MD;  Location: [facility];  Service: Obstetrics;  Laterality: N/A;  . CESAREAN SECTION N/A 03/30/2017   Procedure: REPEAT CESAREAN SECTION;  Surgeon: Lazaro ArmsEure, Luther H, MD;  [facility];  Service: Obstetrics;  Laterality: N/A;  . SKIN GRAFT     off abd, onto arm  . WISDOM TOOTH EXTRACTION      OB History    Gravida  5   Para  3   Term  3   Preterm  0   AB  2   Living  3     SAB  0   TAB  1   Ectopic  0   Multiple  0   Live Births  3            Home Medications    Prior to Admission medications   Medication Sig Start Date End Date Taking? Authorizing Provider  cephALEXin (KEFLEX) 500 MG capsule Take 1 capsule (500 mg total) by mouth 4 (four) times daily. 01/23/18   Eustace Moore, MD  clotrimazole (LOTRIMIN) 1 % cream Apply 1 application topically 2 (two) times daily. 01/17/18   Willodean Rosenthal, MD  naproxen (NAPROSYN) 500 MG tablet Take 1 tablet (500 mg total) by mouth 2 (two) times daily. 01/23/18   Eustace Moore, MD  ondansetron (ZOFRAN) 4 MG tablet Take 1 tablet (4 mg total) by mouth every 6 (six) hours as needed for nausea. 01/23/18   Eustace Moore, MD    Family History Family History  Problem Relation Age of Onset  . Hypertension  Mother   . Diabetes Mother   . Cancer Mother        BREAST  . Stroke Mother   . Seizures Mother   . Asthma Daughter   . Hypertension Maternal Grandmother   . Diabetes Maternal Grandmother   . Cancer Maternal Grandmother        BONE CANCER  . Other Neg Hx     Social History Social History   Tobacco Use  . Smoking status: Never Smoker  . Smokeless tobacco: Never Used  Substance Use Topics  . Alcohol use: No    Comment: socially but none with pregnancy  . Drug use: No     Allergies   Toradol [ketorolac tromethamine]   Review of Systems Review of Systems  Constitutional: Negative for chills and fever.  HENT: Negative for ear pain and sore throat.   Eyes: Negative for pain and visual disturbance.  Respiratory: Negative for cough and shortness of breath.   Cardiovascular: Negative for chest pain and palpitations.  Gastrointestinal: Negative for abdominal pain and vomiting.  Genitourinary: Positive for dysuria, frequency and pelvic pain. Negative for dyspareunia, flank pain, hematuria and vaginal discharge.  Musculoskeletal: Negative for arthralgias and back pain.  Skin: Negative for color change and rash.  Neurological: Negative for seizures and syncope.  All other systems reviewed and are negative.    Physical Exam Triage Vital Signs ED Triage Vitals [01/23/18 0956]  Enc Vitals Group     BP 126/72     Pulse Rate 80     Resp 16     Temp 98.6 F (37 C)     Temp Source Oral     SpO2 100 %     Weight      Height      Head Circumference      Peak Flow      Pain Score      Pain Loc      Pain Edu?      Excl. in GC?    No data found.  Updated Vital Signs BP 126/72 (BP Location: Right Arm)   Pulse 80   Temp 98.6 F (37 C) (Oral)   Resp 16   SpO2 100%   Visual Acuity Right Eye Distance:   Left Eye Distance:   Bilateral Distance:    Right Eye Near:   Left Eye Near:    Bilateral Near:     Physical Exam  Constitutional: She appears well-developed  and well-nourished. She appears distressed.  Appears mildly uncomfortable.  HENT:  Head:  Normocephalic and atraumatic.  Mouth/Throat: Oropharynx is clear and moist.  Eyes: Pupils are equal, round, and reactive to light. Conjunctivae are normal.  Neck: Normal range of motion.  Cardiovascular: Normal rate.  Pulmonary/Chest: Effort normal. No respiratory distress.  Abdominal: Soft. Bowel sounds are normal. She exhibits no distension. There is tenderness.  Suprapubic tenderness.  No guarding or rebound  Musculoskeletal: Normal range of motion. She exhibits no edema.  Neurological: She is alert.  Skin: Skin is warm and dry.  Psychiatric: She has a normal mood and affect. Her behavior is normal.     UC Treatments / Results  Labs (all labs ordered are listed, but only abnormal results are displayed) Labs Reviewed  POCT URINALYSIS DIP (DEVICE) - Abnormal; Notable for the following components:      Result Value   Hgb urine dipstick MODERATE (*)    Leukocytes, UA LARGE (*)    All other components within normal limits  URINE CULTURE  POCT PREGNANCY, URINE    EKG None  Radiology No results found.  Procedures Procedures (including critical care time)  Medications Ordered in UC Medications  ketorolac (TORADOL) injection 60 mg (60 mg Intramuscular Not Given 01/23/18 1035)    Initial Impression / Assessment and Plan / UC Course  I have reviewed the triage vital signs and the nursing notes.  Pertinent labs & imaging results that were available during my care of the patient were reviewed by me and considered in my medical decision making (see chart for details).     Final Clinical Impressions(s) / UC Diagnoses   Final diagnoses:  Chronic pelvic pain in female  Cystitis  Dysmenorrhea     Discharge Instructions     I have offered you a shot for pain. If taken with benadryl, I believe we can control the itching.  You may choose not to take. I have given you antibiotic to  take for the urinary infection.  Take 4 times a day for 5 days. Drink plenty of fluids. Take Naprosyn twice a day with food.  This is a good medicine for cramping and pelvic pain.    ED Prescriptions    Medication Sig Dispense Auth. Provider   cephALEXin (KEFLEX) 500 MG capsule Take 1 capsule (500 mg total) by mouth 4 (four) times daily. 20 capsule Eustace Moore, MD   naproxen (NAPROSYN) 500 MG tablet Take 1 tablet (500 mg total) by mouth 2 (two) times daily. 30 tablet Eustace Moore, MD   ondansetron (ZOFRAN) 4 MG tablet Take 1 tablet (4 mg total) by mouth every 6 (six) hours as needed for nausea. 20 tablet Eustace Moore, MD     Controlled Substance Prescriptions [place] Controlled Substance Registry consulted? Not Applicable   Eustace Moore, MD 01/23/18 1039

## 2018-01-23 NOTE — ED Triage Notes (Signed)
Pt c/o ongoing pelvic pain since feb, has been seen [facility] for [facility].

## 2018-01-24 LAB — URINE CULTURE: Culture: <10000 — AB

## 2018-02-16 ENCOUNTER — Other Ambulatory Visit: Payer: Self-pay

## 2018-02-16 ENCOUNTER — Emergency Department
Admission: EM | Admit: 2018-02-16 | Discharge: 2018-02-16 | Disposition: A | Discharge status: Home / Self Care | Payer: Medicaid Other | Attending: Emergency Medicine | Admitting: Emergency Medicine

## 2018-02-16 ENCOUNTER — Encounter: Payer: Self-pay | Admitting: Emergency Medicine

## 2018-02-16 DIAGNOSIS — R1012 Left upper quadrant pain: Secondary | ICD-10-CM | POA: Diagnosis present

## 2018-02-16 DIAGNOSIS — R300 Dysuria: Secondary | ICD-10-CM | POA: Insufficient documentation

## 2018-02-16 DIAGNOSIS — R1011 Right upper quadrant pain: Secondary | ICD-10-CM | POA: Insufficient documentation

## 2018-02-16 DIAGNOSIS — Z5321 Procedure and treatment not carried out due to patient leaving prior to being seen by health care provider: Secondary | ICD-10-CM | POA: Diagnosis not present

## 2018-02-16 LAB — URINALYSIS, COMPLETE (UACMP) WITH MICROSCOPIC
Bacteria, UA: NONE SEEN
Bilirubin Urine: NEGATIVE
Glucose, UA: NEGATIVE mg/dL
Ketones, ur: NEGATIVE mg/dL
Nitrite: NEGATIVE
Protein, ur: NEGATIVE mg/dL
Specific Gravity, Urine: 1.003 — ABNORMAL LOW (ref 1.005–1.030)
pH: 7 (ref 5.0–8.0)

## 2018-02-16 LAB — COMPREHENSIVE METABOLIC PANEL
ALT: 18 U/L (ref 0–44)
AST: 17 U/L (ref 15–41)
Albumin: 4.2 g/dL (ref 3.5–5.0)
Alkaline Phosphatase: 63 U/L (ref 38–126)
Anion gap: 8 (ref 5–15)
BUN: 12 mg/dL (ref 6–20)
CO2: 22 mmol/L (ref 22–32)
Calcium: 9 mg/dL (ref 8.9–10.3)
Chloride: 106 mmol/L (ref 98–111)
Creatinine, Ser: 0.89 mg/dL (ref 0.44–1.00)
GFR calc Af Amer: >60 mL/min (ref >60)
GFR calc non Af Amer: >60 mL/min (ref >60)
Glucose, Bld: 95 mg/dL (ref 70–99)
Potassium: 4.1 mmol/L (ref 3.5–5.1)
Sodium: 136 mmol/L (ref 135–145)
Total Bilirubin: 0.6 mg/dL (ref 0.3–1.2)
Total Protein: 8.3 g/dL — ABNORMAL HIGH (ref 6.5–8.1)

## 2018-02-16 LAB — POCT PREGNANCY, URINE: Preg Test, Ur: NEGATIVE

## 2018-02-16 LAB — LIPASE, BLOOD: Lipase: 24 U/L (ref 11–51)

## 2018-02-16 NOTE — ED Notes (Signed)
Went to waiting room and called for pt x3 with no answer - registration said pt had pink hair - searched waiting room and outside for pt - will attempt to call again

## 2018-02-16 NOTE — ED Triage Notes (Signed)
Says pain both sides of abd both upper and loer since the day before yesterday.  Says sometimes urine burns, but not when she urinates. Also says diarrhea for 2 weeks.

## 2018-02-16 NOTE — ED Notes (Signed)
Went to waiting room and called for pt - no answer - first nurse notified that pt had left

## 2018-02-17 ENCOUNTER — Telehealth: Payer: Self-pay | Admitting: Emergency Medicine

## 2018-02-17 NOTE — Telephone Encounter (Signed)
Called patient due to lwot to inquire about condition and follow up plans. Left message.   

## 2018-02-22 ENCOUNTER — Emergency Department: Payer: Medicaid Other

## 2018-02-22 ENCOUNTER — Emergency Department
Admission: EM | Admit: 2018-02-22 | Discharge: 2018-02-22 | Disposition: A | Discharge status: Home / Self Care | Payer: Medicaid Other | Attending: Emergency Medicine | Admitting: Emergency Medicine

## 2018-02-22 ENCOUNTER — Encounter: Payer: Self-pay | Admitting: Emergency Medicine

## 2018-02-22 DIAGNOSIS — G8929 Other chronic pain: Secondary | ICD-10-CM | POA: Diagnosis not present

## 2018-02-22 DIAGNOSIS — R55 Syncope and collapse: Secondary | ICD-10-CM | POA: Insufficient documentation

## 2018-02-22 DIAGNOSIS — R102 Pelvic and perineal pain: Secondary | ICD-10-CM | POA: Diagnosis not present

## 2018-02-22 DIAGNOSIS — Z79899 Other long term (current) drug therapy: Secondary | ICD-10-CM | POA: Diagnosis not present

## 2018-02-22 DIAGNOSIS — E860 Dehydration: Secondary | ICD-10-CM | POA: Diagnosis not present

## 2018-02-22 DIAGNOSIS — R109 Unspecified abdominal pain: Secondary | ICD-10-CM

## 2018-02-22 LAB — CBC WITH DIFFERENTIAL/PLATELET
Basophils Absolute: 0 10*3/uL (ref 0–0.1)
Basophils Relative: 1 %
Eosinophils Absolute: 0.1 10*3/uL (ref 0–0.7)
Eosinophils Relative: 1 %
HCT: 37.8 % (ref 35.0–47.0)
Hemoglobin: 12.9 g/dL (ref 12.0–16.0)
Lymphocytes Relative: 27 %
Lymphs Abs: 1.6 10*3/uL (ref 1.0–3.6)
MCH: 28.3 pg (ref 26.0–34.0)
MCHC: 34.2 g/dL (ref 32.0–36.0)
MCV: 82.8 fL (ref 80.0–100.0)
Monocytes Absolute: 0.5 10*3/uL (ref 0.2–0.9)
Monocytes Relative: 9 %
Neutro Abs: 3.8 10*3/uL (ref 1.4–6.5)
Neutrophils Relative %: 62 %
Platelets: 171 10*3/uL (ref 150–440)
RBC: 4.56 MIL/uL (ref 3.80–5.20)
RDW: 15.2 % — ABNORMAL HIGH (ref 11.5–14.5)
WBC: 6 10*3/uL (ref 3.6–11.0)

## 2018-02-22 LAB — COMPREHENSIVE METABOLIC PANEL
ALT: 18 U/L (ref 0–44)
AST: 18 U/L (ref 15–41)
Albumin: 4.1 g/dL (ref 3.5–5.0)
Alkaline Phosphatase: 74 U/L (ref 38–126)
Anion gap: 8 (ref 5–15)
BUN: 12 mg/dL (ref 6–20)
CO2: 21 mmol/L — ABNORMAL LOW (ref 22–32)
Calcium: 9.1 mg/dL (ref 8.9–10.3)
Chloride: 111 mmol/L (ref 98–111)
Creatinine, Ser: 0.74 mg/dL (ref 0.44–1.00)
GFR calc Af Amer: >60 mL/min (ref >60)
GFR calc non Af Amer: >60 mL/min (ref >60)
Glucose, Bld: 100 mg/dL — ABNORMAL HIGH (ref 70–99)
Potassium: 3.5 mmol/L (ref 3.5–5.1)
Sodium: 140 mmol/L (ref 135–145)
Total Bilirubin: 0.7 mg/dL (ref 0.3–1.2)
Total Protein: 8 g/dL (ref 6.5–8.1)

## 2018-02-22 LAB — ETHANOL: Alcohol, Ethyl (B): <10 mg/dL (ref <10)

## 2018-02-22 LAB — URINALYSIS, COMPLETE (UACMP) WITH MICROSCOPIC
Bacteria, UA: NONE SEEN
Bilirubin Urine: NEGATIVE
Glucose, UA: NEGATIVE mg/dL
Hgb urine dipstick: NEGATIVE
Ketones, ur: NEGATIVE mg/dL
Nitrite: NEGATIVE
Protein, ur: NEGATIVE mg/dL
Specific Gravity, Urine: 1.016 (ref 1.005–1.030)
pH: 6 (ref 5.0–8.0)

## 2018-02-22 LAB — LIPASE, BLOOD: Lipase: 23 U/L (ref 11–51)

## 2018-02-22 LAB — ACETAMINOPHEN LEVEL: Acetaminophen (Tylenol), Serum: <10 ug/mL — ABNORMAL LOW (ref 10–30)

## 2018-02-22 LAB — SALICYLATE LEVEL: Salicylate Lvl: <7 mg/dL (ref 2.8–30.0)

## 2018-02-22 LAB — POCT PREGNANCY, URINE: Preg Test, Ur: NEGATIVE

## 2018-02-22 MED ORDER — ONDANSETRON HCL 4 MG/2ML IJ SOLN
4.0000 mg | Freq: Once | INTRAMUSCULAR | Status: AC
  Administered 2018-02-22: 4 mg via INTRAVENOUS
  Filled 2018-02-22: qty 2

## 2018-02-22 MED ORDER — SODIUM CHLORIDE 0.9 % IV BOLUS
1000.0000 mL | Freq: Once | INTRAVENOUS | Status: AC
  Administered 2018-02-22: 1000 mL via INTRAVENOUS

## 2018-02-22 MED ORDER — ONDANSETRON 4 MG PO TBDP: 4.0000 mg | ORAL_TABLET | Freq: Three times a day (TID) | ORAL | 0 refills | Status: DC | PRN

## 2018-02-22 NOTE — Discharge Instructions (Addendum)
Your test today, including blood test, urine test, and CT scans of your head and neck were unremarkable.  Your symptoms appear to be due to dehydration.  Please ensure you are eating regularly and drinking lots of water during the hot weather to avoid symptoms like this in the future.  Follow-up with your doctor for continued monitoring of your symptoms.

## 2018-02-22 NOTE — ED Notes (Signed)
Patient transported to CT 

## 2018-02-22 NOTE — ED Triage Notes (Signed)
Patient drove herself to ED with children. On the way into ED, patient had syncopal episode and fell in parking lot. Children report that mother was complaining of dizziness before arrival. Upon exiting car, patient was stumbling and then fell forward onto the ground. Upon staff arrival to patient, patient was laying face down on the pavement. Even, unlabored respirations noted and patients eyelids fluttering. C-spine precautions taken to roll patient onto back. Patient responsive to sternal rub and able to give this RN her name. Patient slow to respond and follow commands. C-collar applied. Patient log rolled onto backboard and placed on stretcher. Patient taken to ED room. MD at bedside upon arrival.

## 2018-02-22 NOTE — ED Provider Notes (Signed)
[facility] Emergency Department Provider Note  ____________________________________________  Time seen: Approximately 9:01 PM  I have reviewed the triage vital signs and the nursing notes.   HISTORY  Chief Complaint Loss of Consciousness and Abdominal Pain    HPI Chloe ParrShakilla Rodgers is a 27 y.o. female with a history of anemia, chlamydia, depression, kidney stones, seizures, chronic pelvic pain who complains of baseline abdominal pain as well as dizziness with standing since yesterday.  Also reports intermittent diarrhea.  Poor appetite over the past few days.  On initial arrival, she had had syncope in the hospital parking lot on her way into the waiting room.  She was observed to fall and hit her head on the ground.  She was unresponsive to verbal stimulus but opening her eyes and moving them in all directions with noxious stimulus.  Otherwise lots of eye fluttering without other motor activity..  After a few minutes,  patient awoke and returned to normal and was able to answer questions.  She notes that she has not been eating or drinking much over the past few days and feeling really dehydrated and dizzy.  Symptoms are constant, worsening, worse with standing, better lying down.  After the fall she has bilateral frontal headache.  No neck pain.   Past Medical History:  Diagnosis Date  . Anemia   . Anxiety   . Chlamydia   . Complication of anesthesia    ??, seizure with wisdom teeth  . Depression    not on meds, sees a therapist  . Infection    UTI  . Kidney stone    kidney stones  . Migraine   . PID (acute pelvic inflammatory disease) 2014  . PONV (postoperative nausea and vomiting)   . Psoriasis   . Seizures [facility](HCC)    [place]     Patient Active Problem List   Diagnosis Date Noted  . Chronic PID (chronic pelvic inflammatory disease) 12/13/2017  . Pelvic inflammatory disease 09/29/2017  . Status post repeat low transverse cesarean  section 03/30/2017  . Depression affecting pregnancy in second trimester, antepartum 01/05/2017  . Supervision of normal pregnancy 10/06/2016  . Chlamydia infection affecting pregnancy in first trimester 08/23/2016  . Pelvic inflammatory disease (PID) 02/21/2013  . Migraines 07/24/2012  . Previous cesarean delivery, antepartum condition or complication 03/30/2012  . Obese 03/30/2012  . Seizure disorder (HCC) 02/03/2012  . Family history of breast cancer in mother 02/03/2012     Past Surgical History:  Procedure Laterality Date  . CESAREAN SECTION    . CESAREAN SECTION  06/06/2012   Procedure: CESAREAN SECTION;  Surgeon: Adam PhenixJames G Arnold, MD;  Location: [facility];  Service: Obstetrics;  Laterality: N/A;  . CESAREAN SECTION N/A 03/30/2017   Procedure: REPEAT CESAREAN SECTION;  Surgeon: Lazaro ArmsEure, Luther H, MD;  [facility];  Service: Obstetrics;  Laterality: N/A;  . SKIN GRAFT     off abd, onto arm  . WISDOM TOOTH EXTRACTION       Prior to Admission medications   Medication Sig Start Date End Date Taking? Authorizing Provider  cephALEXin (KEFLEX) 500 MG capsule Take 1 capsule (500 mg total) by mouth 4 (four) times daily. 01/23/18   Eustace MooreNelson, Yvonne Sue, MD  clotrimazole (LOTRIMIN) 1 % cream Apply 1 application topically 2 (two) times daily. 01/17/18   Willodean RosenthalHarraway-Smith, Carolyn, MD  naproxen (NAPROSYN) 500 MG tablet Take 1 tablet (500 mg total) by mouth 2 (two) times daily. 01/23/18   Delton SeeNelson,  Letta PateYvonne Sue, MD  ondansetron (ZOFRAN ODT) 4 MG disintegrating tablet Take 1 tablet (4 mg total) by mouth every 8 (eight) hours as needed for nausea or vomiting. 02/22/18   Sharman CheekStafford, Arslan Kier, MD  ondansetron (ZOFRAN) 4 MG tablet Take 1 tablet (4 mg total) by mouth every 6 (six) hours as needed for nausea. 01/23/18   Eustace MooreNelson, Yvonne Sue, MD     Allergies Naproxen and Toradol [ketorolac tromethamine]   Family History  Problem Relation Age of Onset  . Hypertension Mother   . Diabetes Mother   .  Cancer Mother        BREAST  . Stroke Mother   . Seizures Mother   . Asthma Daughter   . Hypertension Maternal Grandmother   . Diabetes Maternal Grandmother   . Cancer Maternal Grandmother        BONE CANCER  . Other Neg Hx     Social History Social History   Tobacco Use  . Smoking status: Never Smoker  . Smokeless tobacco: Never Used  Substance Use Topics  . Alcohol use: No    Comment: socially but none with pregnancy  . Drug use: No    Review of Systems  Constitutional:   No fever or chills.  ENT:   No sore throat. No rhinorrhea. Cardiovascular:   No chest pain or syncope. Respiratory:   No dyspnea or cough. Gastrointestinal:   Positive for chronic abdominal pain.  No vomiting.  Occasional diarrhea.  No unusual vaginal bleeding or discharge. Musculoskeletal:   Negative for focal pain or swelling All other systems reviewed and are negative except as documented above in ROS and HPI.  ____________________________________________   PHYSICAL EXAM:  VITAL SIGNS: ED Triage Vitals  Enc Vitals Group     BP 02/22/18 1722 (!) 164/108     Pulse Rate 02/22/18 1722 89     Resp 02/22/18 1722 14     Temp 02/22/18 1722 98.9 F (37.2 C)     Temp Source 02/22/18 1722 Oral     SpO2 02/22/18 1722 100 %     Weight 02/22/18 1723 229 lb 4.5 oz (104 kg)     Height 02/22/18 1723 5\' 6"  (1.676 m)     Head Circumference --      Peak Flow --      Pain Score 02/22/18 1722 9     Pain Loc --      Pain Edu? --      Excl. in GC? --     Vital signs reviewed, nursing assessments reviewed.   Constitutional:   Alert and oriented. Non-toxic appearance. Eyes:   Conjunctivae are normal. EOMI. PERRL. ENT      Head:   Normocephalic and atraumatic.      Nose:   No congestion/rhinnorhea.       Mouth/Throat:   MMM, no pharyngeal erythema. No peritonsillar mass.  No tongue biting      Neck:   No meningismus.  In c-collar. Hematological/Lymphatic/Immunilogical:   No cervical  lymphadenopathy. Cardiovascular:   RRR. Symmetric bilateral radial and DP pulses.  No murmurs. Cap refill less than 2 seconds. Respiratory:   Normal respiratory effort without tachypnea/retractions. Breath sounds are clear and equal bilaterally. No wheezes/rales/rhonchi. Gastrointestinal:   Soft and nontender. Non distended. There is no CVA tenderness.  No rebound, rigidity, or guarding.  No urinary incontinence Musculoskeletal:   Normal range of motion in all extremities. No joint effusions.  No lower extremity tenderness.  No edema. Neurologic:  Normal speech and language.  Motor grossly intact. No acute focal neurologic deficits are appreciated.  Skin:    Skin is warm, dry and intact. No rash noted.  No petechiae, purpura, or bullae.  ____________________________________________    LABS (pertinent positives/negatives) (all labs ordered are listed, but only abnormal results are displayed) Labs Reviewed  COMPREHENSIVE METABOLIC PANEL - Abnormal; Notable for the following components:      Result Value   CO2 21 (*)    Glucose, Bld 100 (*)    All other components within normal limits  ACETAMINOPHEN LEVEL - Abnormal; Notable for the following components:   Acetaminophen (Tylenol), Serum <10 (*)    All other components within normal limits  CBC WITH DIFFERENTIAL/PLATELET - Abnormal; Notable for the following components:   RDW 15.2 (*)    All other components within normal limits  URINALYSIS, COMPLETE (UACMP) WITH MICROSCOPIC - Abnormal; Notable for the following components:   Color, Urine YELLOW (*)    APPearance CLEAR (*)    Leukocytes, UA TRACE (*)    All other components within normal limits  URINE CULTURE  ETHANOL  SALICYLATE LEVEL  LIPASE, BLOOD  URINE DRUG SCREEN, QUALITATIVE ([facility] ONLY)  POCT PREGNANCY, URINE  POC URINE PREG, ED  CBG MONITORING, ED   ____________________________________________   EKG    ____________________________________________     RADIOLOGY  Ct Head Wo Contrast  Result Date: 02/22/2018 CLINICAL DATA:  syncopal episode and fell in parking lot. Children report that mother was complaining of dizziness before arrival. Upon exiting car, patient was stumbling and then fell forward onto the ground. Upon staff arrival to patient, patient was laying face down on the pavement. Even, unlabored respirations noted and patients eyelids fluttering. EXAM: CT HEAD WITHOUT CONTRAST CT CERVICAL SPINE WITHOUT CONTRAST TECHNIQUE: Multidetector CT imaging of the head and cervical spine was performed following the standard protocol without intravenous contrast. Multiplanar CT image reconstructions of the cervical spine were also generated. COMPARISON:  05/02/2016 FINDINGS: CT HEAD FINDINGS Brain: No evidence of acute infarction, hemorrhage, hydrocephalus, extra-axial collection or mass lesion/mass effect. Vascular: No hyperdense vessel or unexpected calcification. Skull: Normal. Negative for fracture or focal lesion. Sinuses/Orbits: No acute finding. Other: None. CT CERVICAL SPINE FINDINGS Alignment: Mild reversal of the normal lordosis. Skull base and vertebrae: No acute fracture. No primary bone lesion or focal pathologic process. Soft tissues and spinal canal: No prevertebral fluid or swelling. No visible canal hematoma. Disc levels: Maintained throughout. No significant degenerative change. Upper chest: Negative. Other: None IMPRESSION: 1. Negative for bleed or other acute intracranial process. 2. Negative for cervical fracture or other acute injury. 3. Loss of the normal cervical spine lordosis, which may be secondary to positioning, spasm, or soft tissue injury. Electronically Signed   By: Corlis Leak M.D.   On: 02/22/2018 19:09   Ct Cervical Spine Wo Contrast  Result Date: 02/22/2018 CLINICAL DATA:  syncopal episode and fell in parking lot. Children report that mother was complaining of dizziness before arrival. Upon exiting car, patient was  stumbling and then fell forward onto the ground. Upon staff arrival to patient, patient was laying face down on the pavement. Even, unlabored respirations noted and patients eyelids fluttering. EXAM: CT HEAD WITHOUT CONTRAST CT CERVICAL SPINE WITHOUT CONTRAST TECHNIQUE: Multidetector CT imaging of the head and cervical spine was performed following the standard protocol without intravenous contrast. Multiplanar CT image reconstructions of the cervical spine were also generated. COMPARISON:  05/02/2016 FINDINGS: CT HEAD FINDINGS Brain: No evidence of  acute infarction, hemorrhage, hydrocephalus, extra-axial collection or mass lesion/mass effect. Vascular: No hyperdense vessel or unexpected calcification. Skull: Normal. Negative for fracture or focal lesion. Sinuses/Orbits: No acute finding. Other: None. CT CERVICAL SPINE FINDINGS Alignment: Mild reversal of the normal lordosis. Skull base and vertebrae: No acute fracture. No primary bone lesion or focal pathologic process. Soft tissues and spinal canal: No prevertebral fluid or swelling. No visible canal hematoma. Disc levels: Maintained throughout. No significant degenerative change. Upper chest: Negative. Other: None IMPRESSION: 1. Negative for bleed or other acute intracranial process. 2. Negative for cervical fracture or other acute injury. 3. Loss of the normal cervical spine lordosis, which may be secondary to positioning, spasm, or soft tissue injury. Electronically Signed   By: Corlis Leak M.D.   On: 02/22/2018 19:09    ____________________________________________   PROCEDURES Procedures  ____________________________________________  DIFFERENTIAL DIAGNOSIS   Epidural hematoma, subdural hematoma, cervical spine fracture, dehydration, electrode disturbance, seizure  CLINICAL IMPRESSION / ASSESSMENT AND PLAN / ED COURSE  Pertinent labs & imaging results that were available during my care of the patient were reviewed by me and considered in my  medical decision making (see chart for details).    Patient presents with likely syncope versus seizure.  I suspect dehydration.  Due to the syncope and head injury, patient arrives in a c-collar applied in the parking lot.  CT scan of the head and neck ordered to evaluate for traumatic injury while checking labs and giving IV fluids for hydration.  Clinical Course as of Feb 22 2100  Wed Feb 22, 2018  2044 Work-up entirely negative.  CT head and neck negative for acute somatic injury.  Labs unremarkable including tox work-up.  Suitable for discharge home and outpatient follow-up.  Likely a viral syndrome with dehydration.   [PS]    Clinical Course User Index [PS] Sharman Cheek, MD    ----------------------------------------- 9:05 PM on 02/22/2018 -----------------------------------------  Patient feels much better after IV fluid hydration.  Work-up is entirely negative including CT scans and labs and urinalysis.  Tox work-up negative.  Appears to be dehydration due to poor oral intake.  Chronic pelvic pain that is unchanged from baseline without any symptoms to suggest STI or PID.  Follow-up with primary care.   ____________________________________________   FINAL CLINICAL IMPRESSION(S) / ED DIAGNOSES    Final diagnoses:  Chronic abdominal pain  Dehydration  Syncope, unspecified syncope type     ED Discharge Orders         Ordered    ondansetron (ZOFRAN ODT) 4 MG disintegrating tablet  Every 8 hours PRN     02/22/18 2100          Portions of this note were generated with dragon dictation software. Dictation errors may occur despite best attempts at proofreading.    Sharman Cheek, MD 02/22/18 2105

## 2018-02-24 LAB — URINE CULTURE

## 2018-02-27 ENCOUNTER — Emergency Department (HOSPITAL_COMMUNITY)
Admission: EM | Admit: 2018-02-27 | Discharge: 2018-02-27 | Disposition: A | Discharge status: Home / Self Care | Payer: Medicaid Other | Attending: Emergency Medicine | Admitting: Emergency Medicine

## 2018-02-27 ENCOUNTER — Other Ambulatory Visit (HOSPITAL_COMMUNITY)
Admission: RE | Admit: 2018-02-27 | Discharge: 2018-02-27 | Disposition: A | Discharge status: Home / Self Care | Payer: Medicaid Other | Source: Ambulatory Visit | Attending: Obstetrics & Gynecology | Admitting: Obstetrics & Gynecology

## 2018-02-27 ENCOUNTER — Encounter: Payer: Self-pay | Admitting: Obstetrics & Gynecology

## 2018-02-27 ENCOUNTER — Telehealth: Payer: Self-pay | Admitting: Advanced Practice Midwife

## 2018-02-27 ENCOUNTER — Encounter (HOSPITAL_COMMUNITY): Payer: Self-pay | Admitting: Emergency Medicine

## 2018-02-27 ENCOUNTER — Ambulatory Visit (INDEPENDENT_AMBULATORY_CARE_PROVIDER_SITE_OTHER): Payer: Medicaid Other | Admitting: Obstetrics & Gynecology

## 2018-02-27 VITALS — BP 127/86 | HR 102 | Wt 224.1 lb

## 2018-02-27 DIAGNOSIS — N898 Other specified noninflammatory disorders of vagina: Secondary | ICD-10-CM | POA: Diagnosis not present

## 2018-02-27 DIAGNOSIS — R079 Chest pain, unspecified: Secondary | ICD-10-CM | POA: Diagnosis not present

## 2018-02-27 DIAGNOSIS — N760 Acute vaginitis: Secondary | ICD-10-CM | POA: Diagnosis not present

## 2018-02-27 DIAGNOSIS — B9689 Other specified bacterial agents as the cause of diseases classified elsewhere: Secondary | ICD-10-CM | POA: Insufficient documentation

## 2018-02-27 DIAGNOSIS — R197 Diarrhea, unspecified: Secondary | ICD-10-CM | POA: Insufficient documentation

## 2018-02-27 DIAGNOSIS — R112 Nausea with vomiting, unspecified: Secondary | ICD-10-CM | POA: Diagnosis not present

## 2018-02-27 DIAGNOSIS — Z5321 Procedure and treatment not carried out due to patient leaving prior to being seen by health care provider: Secondary | ICD-10-CM | POA: Diagnosis not present

## 2018-02-27 DIAGNOSIS — R55 Syncope and collapse: Secondary | ICD-10-CM | POA: Diagnosis present

## 2018-02-27 LAB — CBC WITH DIFFERENTIAL/PLATELET
Abs Immature Granulocytes: 0 10*3/uL (ref 0.0–0.1)
Basophils Absolute: 0 10*3/uL (ref 0.0–0.1)
Basophils Relative: 0 %
Eosinophils Absolute: 0 10*3/uL (ref 0.0–0.7)
Eosinophils Relative: 0 %
HCT: 41.1 % (ref 36.0–46.0)
Hemoglobin: 12.9 g/dL (ref 12.0–15.0)
Immature Granulocytes: 0 %
Lymphocytes Relative: 12 %
Lymphs Abs: 0.8 10*3/uL (ref 0.7–4.0)
MCH: 27 pg (ref 26.0–34.0)
MCHC: 31.4 g/dL (ref 30.0–36.0)
MCV: 86.2 fL (ref 78.0–100.0)
Monocytes Absolute: 0.3 10*3/uL (ref 0.1–1.0)
Monocytes Relative: 5 %
Neutro Abs: 5.4 10*3/uL (ref 1.7–7.7)
Neutrophils Relative %: 83 %
Platelets: 154 10*3/uL (ref 150–400)
RBC: 4.77 MIL/uL (ref 3.87–5.11)
RDW: 13.9 % (ref 11.5–15.5)
WBC: 6.6 10*3/uL (ref 4.0–10.5)

## 2018-02-27 LAB — BASIC METABOLIC PANEL
Anion gap: 7 (ref 5–15)
BUN: 10 mg/dL (ref 6–20)
CO2: 20 mmol/L — ABNORMAL LOW (ref 22–32)
Calcium: 8.7 mg/dL — ABNORMAL LOW (ref 8.9–10.3)
Chloride: 111 mmol/L (ref 98–111)
Creatinine, Ser: 0.81 mg/dL (ref 0.44–1.00)
GFR calc Af Amer: >60 mL/min (ref >60)
GFR calc non Af Amer: >60 mL/min (ref >60)
Glucose, Bld: 101 mg/dL — ABNORMAL HIGH (ref 70–99)
Potassium: 3.8 mmol/L (ref 3.5–5.1)
Sodium: 138 mmol/L (ref 135–145)

## 2018-02-27 LAB — LIPASE, BLOOD: Lipase: 26 U/L (ref 11–51)

## 2018-02-27 LAB — CBG MONITORING, ED: Glucose-Capillary: 93 mg/dL (ref 70–99)

## 2018-02-27 NOTE — Telephone Encounter (Signed)
Patient was brought to drive[facility] Her mother went in to get a wheelchair due to patient c/o not feeling well Patient was found face down in driveway, unwitnessed fall  Mother reports she had an abortion "a month ago" and has not felt well since Mother reports hx seizures, no meds, last was 2 years ago  She was breathing spontaneously and HR was 110bpm Eyelids were fluttering She did briefly react to ammonia capsule Not arousable to other stimuli  Lungs clear HR RRR, mild tachycardia  I elected not to move her due to possible C-spine/L-spine injuries 911 called since we did not have a C-collar or back board Dr Vergie LivingPickens did come to scene  They arrived and transported patient I placed two calls to [facility] ED, but was unable to get through   Aviva SignsWilliams, Marie L, CNM [facility]

## 2018-02-27 NOTE — Progress Notes (Signed)
History:  27 y.o. [id number] here today for eval. She reports vaginal discharge.   She went to the MAU earlier today and [facility] '[facility]'.   [facility] was checked in however, when they went to find her she had left. The pt said she had too wait too long.  Her complaint now is vaginal discharge. She does c/o intermittent N/V. NOne currently. Has not seen primary care provider about this issue.   The following portions of the patient's history were reviewed and updated as appropriate: allergies, current medications, past family history, past medical history, past social history, past surgical history and problem list.  Review of Systems:  Pertinent items are noted in HPI.    Objective:  Physical Exam Blood pressure 127/86, pulse (!) 102, weight 224 lb 1.6 oz (101.7 kg), not currently breastfeeding.  CONSTITUTIONAL: Well-developed, well-nourished female in no acute distress.  HENT:  Normocephalic, atraumatic EYES: Conjunctivae and EOM are normal. No scleral icterus.  NECK: Normal range of motion SKIN: Skin is warm and dry. No rash noted. Not diaphoretic.No pallor. NEUROLGIC: Alert and oriented to person, place, and time. Normal coordination.   Abd: Soft, nontender and nondistended Pelvic: Normal appearing external genitalia; normal appearing vaginal mucosa and cervix.  Normal discharge.  Small uterus, no other palpable masses, no uterine or adnexal tenderness   Assessment & Plan:  Vaginal discharge-  Affirm ordered  Will f/u with results and will tx if incdicated   Abhiram Criado L. Harraway-Smith, M.D., Evern CoreFACOG

## 2018-02-27 NOTE — ED Notes (Signed)
Called pt to reassess vitals no answer pt labels and B/P was sitting at my desk but I did not see pt leave.

## 2018-02-27 NOTE — ED Triage Notes (Addendum)
[facility] such as ABD pain and n,v,d. Mom went to get her a wheelchair and found her lying on [facility] and was fluttering eye lashes. Patient would not talk to EMS and when EMS going to insert npa she became verbal. Has c collar on since not talking at first. Complains of headache on forehead and lower back pain and abdominal cramping. [facility] for same exact thing. CBG 161 per EMS

## 2018-03-01 LAB — CERVICOVAGINAL ANCILLARY ONLY
Bacterial vaginitis: POSITIVE — AB
Candida vaginitis: NEGATIVE
Chlamydia: NEGATIVE
Neisseria Gonorrhea: NEGATIVE
Trichomonas: NEGATIVE

## 2018-03-06 ENCOUNTER — Other Ambulatory Visit: Payer: Self-pay | Admitting: Obstetrics & Gynecology

## 2018-03-06 DIAGNOSIS — N760 Acute vaginitis: Principal | ICD-10-CM

## 2018-03-06 DIAGNOSIS — B9689 Other specified bacterial agents as the cause of diseases classified elsewhere: Secondary | ICD-10-CM

## 2018-03-06 MED ORDER — METRONIDAZOLE 500 MG PO TABS
500.0000 mg | ORAL_TABLET | Freq: Two times a day (BID) | ORAL | 0 refills | Status: DC
Start: 2018-03-06 — End: 2018-04-21

## 2018-03-07 ENCOUNTER — Encounter: Payer: Self-pay | Admitting: Obstetrics & Gynecology

## 2018-03-07 ENCOUNTER — Other Ambulatory Visit: Payer: Self-pay | Admitting: General Practice

## 2018-03-07 DIAGNOSIS — N760 Acute vaginitis: Principal | ICD-10-CM

## 2018-03-07 DIAGNOSIS — B9689 Other specified bacterial agents as the cause of diseases classified elsewhere: Secondary | ICD-10-CM

## 2018-03-07 MED ORDER — TINIDAZOLE 500 MG PO TABS: 2.0000 g | ORAL_TABLET | Freq: Every day | ORAL | 0 refills | Status: AC

## 2018-04-20 ENCOUNTER — Emergency Department (HOSPITAL_COMMUNITY)
Admission: EM | Admit: 2018-04-20 | Discharge: 2018-04-20 | Discharge status: Against Medical Advice | Payer: Medicaid Other | Attending: Emergency Medicine | Admitting: Emergency Medicine

## 2018-04-20 ENCOUNTER — Other Ambulatory Visit: Payer: Self-pay

## 2018-04-20 ENCOUNTER — Encounter (HOSPITAL_COMMUNITY): Payer: Self-pay

## 2018-04-20 DIAGNOSIS — R102 Pelvic and perineal pain: Secondary | ICD-10-CM | POA: Diagnosis not present

## 2018-04-20 DIAGNOSIS — Z5321 Procedure and treatment not carried out due to patient leaving prior to being seen by health care provider: Secondary | ICD-10-CM | POA: Diagnosis not present

## 2018-04-20 LAB — URINALYSIS, ROUTINE W REFLEX MICROSCOPIC
Bilirubin Urine: NEGATIVE
Glucose, UA: NEGATIVE mg/dL
Hgb urine dipstick: NEGATIVE
Ketones, ur: NEGATIVE mg/dL
Leukocytes, UA: NEGATIVE
Nitrite: NEGATIVE
Protein, ur: NEGATIVE mg/dL
Specific Gravity, Urine: 1.028 (ref 1.005–1.030)
pH: 5 (ref 5.0–8.0)

## 2018-04-20 NOTE — ED Triage Notes (Signed)
Pt endorses pelvic pain "for years" has hx of PID. Denies burning with urination.

## 2018-04-21 ENCOUNTER — Encounter (HOSPITAL_COMMUNITY): Payer: Self-pay | Admitting: *Deleted

## 2018-04-21 ENCOUNTER — Inpatient Hospital Stay (HOSPITAL_COMMUNITY)
Admission: AD | Admit: 2018-04-21 | Discharge: 2018-04-21 | Disposition: A | Discharge status: Home / Self Care | Payer: Medicaid Other | Source: Ambulatory Visit | Attending: Family Medicine | Admitting: Family Medicine

## 2018-04-21 ENCOUNTER — Inpatient Hospital Stay (HOSPITAL_COMMUNITY): Payer: Medicaid Other

## 2018-04-21 DIAGNOSIS — N760 Acute vaginitis: Secondary | ICD-10-CM | POA: Insufficient documentation

## 2018-04-21 DIAGNOSIS — R102 Pelvic and perineal pain: Secondary | ICD-10-CM | POA: Diagnosis not present

## 2018-04-21 DIAGNOSIS — B9689 Other specified bacterial agents as the cause of diseases classified elsewhere: Secondary | ICD-10-CM | POA: Insufficient documentation

## 2018-04-21 DIAGNOSIS — N939 Abnormal uterine and vaginal bleeding, unspecified: Secondary | ICD-10-CM | POA: Diagnosis not present

## 2018-04-21 DIAGNOSIS — G8929 Other chronic pain: Secondary | ICD-10-CM | POA: Diagnosis present

## 2018-04-21 LAB — URINALYSIS, ROUTINE W REFLEX MICROSCOPIC
Bacteria, UA: NONE SEEN
Bilirubin Urine: NEGATIVE
Glucose, UA: NEGATIVE mg/dL
Ketones, ur: NEGATIVE mg/dL
Nitrite: NEGATIVE
Protein, ur: NEGATIVE mg/dL
Specific Gravity, Urine: 1.03 (ref 1.005–1.030)
pH: 5 (ref 5.0–8.0)

## 2018-04-21 LAB — POCT PREGNANCY, URINE: Preg Test, Ur: NEGATIVE

## 2018-04-21 LAB — COMPREHENSIVE METABOLIC PANEL WITH GFR
ALT: 22 U/L (ref 0–44)
AST: 18 U/L (ref 15–41)
Albumin: 3.4 g/dL — ABNORMAL LOW (ref 3.5–5.0)
Alkaline Phosphatase: 56 U/L (ref 38–126)
Anion gap: 9 (ref 5–15)
BUN: 11 mg/dL (ref 6–20)
CO2: 22 mmol/L (ref 22–32)
Calcium: 8.6 mg/dL — ABNORMAL LOW (ref 8.9–10.3)
Chloride: 108 mmol/L (ref 98–111)
Creatinine, Ser: 0.8 mg/dL (ref 0.44–1.00)
GFR calc Af Amer: >60 mL/min
GFR calc non Af Amer: >60 mL/min
Glucose, Bld: 85 mg/dL (ref 70–99)
Potassium: 4.1 mmol/L (ref 3.5–5.1)
Sodium: 139 mmol/L (ref 135–145)
Total Bilirubin: 0.6 mg/dL (ref 0.3–1.2)
Total Protein: 6.5 g/dL (ref 6.5–8.1)

## 2018-04-21 LAB — WET PREP, GENITAL
Sperm: NONE SEEN
Trich, Wet Prep: NONE SEEN
Yeast Wet Prep HPF POC: NONE SEEN

## 2018-04-21 LAB — CBC
HCT: 39 % (ref 36.0–46.0)
Hemoglobin: 13 g/dL (ref 12.0–15.0)
MCH: 27.5 pg (ref 26.0–34.0)
MCHC: 33.3 g/dL (ref 30.0–36.0)
MCV: 82.6 fL (ref 80.0–100.0)
Platelets: 114 10*3/uL — ABNORMAL LOW (ref 150–400)
RBC: 4.72 MIL/uL (ref 3.87–5.11)
RDW: 13.3 % (ref 11.5–15.5)
WBC: 5.6 10*3/uL (ref 4.0–10.5)
nRBC: 0 % (ref 0.0–0.2)

## 2018-04-21 MED ORDER — KETOROLAC TROMETHAMINE 30 MG/ML IJ SOLN: 30.0000 mg | Freq: Once | INTRAMUSCULAR | Status: DC

## 2018-04-21 MED ORDER — CLINDAMYCIN HCL 300 MG PO CAPS: 300.0000 mg | ORAL_CAPSULE | Freq: Two times a day (BID) | ORAL | 0 refills | Status: DC

## 2018-04-21 NOTE — MAU Note (Signed)
Pt came to desk stating she has to leave and pick up her son from school Ask if her prescriptions could be sent to pharmacy. Dr Adrian Blackwater notified.

## 2018-04-21 NOTE — MAU Note (Signed)
Pt states that she has taken torodol before and it doesn't cause an allergy

## 2018-04-21 NOTE — MAU Note (Signed)
Pt reports she has been having lower abd, lower back pain, nausea and vomiting for "years" but reports the pain has worsened over the last 3 days. Also reports vaginal bleeding "off/on for a year"

## 2018-04-21 NOTE — Discharge Instructions (Signed)

## 2018-04-21 NOTE — MAU Note (Signed)
Pt reports she was at [facility] ED yesterday but left because it was taking too long

## 2018-04-21 NOTE — MAU Provider Note (Signed)
History     CSN: [id number]  Arrival date and time: 04/21/18 0949   None     Chief Complaint  Patient presents with  . Abdominal Pain  . Back Pain  . Vaginal Bleeding  . Emesis  . Nausea   HPI This is a 27 year old G5 P3-0-2-3 who presents with acute on chronic pelvic pain with vaginal discharge.  Is been having vaginal discharge for the past 2 to 3 weeks with increasing pelvic pain.  She states that she has a history of PID and has had multiple episodes of this.  She states that this is the same type of pain this she has had in the past.  She has been worked up for pelvic pain in the past with multiple transvaginal ultrasounds and a pelvic CT over the past 6 months.  Her scans have been relatively normal.  She also admits to intermittent fever over the past 24 hours with a temperature as high as 102.  Pain is constant, without radiation.  She has accompanying symptoms of back pain.  She also has malodorous vaginal discharge that is thin and gray with some external vaginal burning.  OB History    Gravida  5   Para  3   Term  3   Preterm  0   AB  2   Living  3     SAB  0   TAB  1   Ectopic  0   Multiple  0   Live Births  3           Past Medical History:  Diagnosis Date  . Anemia   . Anxiety   . Chlamydia   . Complication of anesthesia    ??, seizure with wisdom teeth  . Depression    not on meds, sees a therapist  . Infection    UTI  . Kidney stone    kidney stones  . Migraine   . PID (acute pelvic inflammatory disease) 2014  . PONV (postoperative nausea and vomiting)   . Psoriasis   . Seizures [facility])    July 2013 - [place]    Past Surgical History:  Procedure Laterality Date  . CESAREAN SECTION    . CESAREAN SECTION  06/06/2012   Procedure: CESAREAN SECTION;  Surgeon: Adam Phenix, MD;  Location: [facility] ORS;  Service: Obstetrics;  Laterality: N/A;  . CESAREAN SECTION N/A 03/30/2017   Procedure: REPEAT CESAREAN SECTION;  Surgeon: Lazaro Arms, MD;  Location: [facility] BIRTHING SUITES;  Service: Obstetrics;  Laterality: N/A;  . SKIN GRAFT     off abd, onto arm  . WISDOM TOOTH EXTRACTION      Family History  Problem Relation Age of Onset  . Hypertension Mother   . Diabetes Mother   . Cancer Mother        BREAST  . Stroke Mother   . Seizures Mother   . Asthma Daughter   . Hypertension Maternal Grandmother   . Diabetes Maternal Grandmother   . Cancer Maternal Grandmother        BONE CANCER  . Other Neg Hx     Social History   Tobacco Use  . Smoking status: Never Smoker  . Smokeless tobacco: Never Used  Substance Use Topics  . Alcohol use: No    Comment: socially but none with pregnancy  . Drug use: No    Allergies:  Allergies  Allergen Reactions  . Naproxen Itching  . Toradol [Ketorolac Tromethamine] Itching  Medications Prior to Admission  Medication Sig Dispense Refill Last Dose  . cephALEXin (KEFLEX) 500 MG capsule Take 1 capsule (500 mg total) by mouth 4 (four) times daily. 20 capsule 0   . clotrimazole (LOTRIMIN) 1 % cream Apply 1 application topically 2 (two) times daily. 60 g 0   . metroNIDAZOLE (FLAGYL) 500 MG tablet Take 1 tablet (500 mg total) by mouth 2 (two) times daily. 14 tablet 0   . naproxen (NAPROSYN) 500 MG tablet Take 1 tablet (500 mg total) by mouth 2 (two) times daily. 30 tablet 0   . ondansetron (ZOFRAN ODT) 4 MG disintegrating tablet Take 1 tablet (4 mg total) by mouth every 8 (eight) hours as needed for nausea or vomiting. 20 tablet 0   . ondansetron (ZOFRAN) 4 MG tablet Take 1 tablet (4 mg total) by mouth every 6 (six) hours as needed for nausea. 20 tablet 0     Review of Systems Physical Exam   Blood pressure 129/89, pulse 82, temperature 97.6 F (36.4 C), temperature source Oral, resp. rate 16, height 5\' 6"  (1.676 m), weight 106.1 kg, SpO2 100 %, not currently breastfeeding.  Physical Exam  Constitutional: She is oriented to person, place, and time. She appears  well-developed and well-nourished.  Cardiovascular: Normal rate, regular rhythm and normal heart sounds.  Respiratory: Effort normal and breath sounds normal.  GI: Soft. Bowel sounds are normal. She exhibits no distension and no mass. There is tenderness (RLQ and LLQ tenderness). There is no rebound and no guarding. Hernia confirmed negative in the right inguinal area and confirmed negative in the left inguinal area.  Genitourinary: There is no rash, tenderness, lesion or injury on the right labia. There is no rash, tenderness, lesion or injury on the left labia. Uterus is tender. Uterus is not deviated, not enlarged and not fixed. Cervix exhibits no motion tenderness, no discharge and no friability. Right adnexum displays tenderness. Right adnexum displays no mass and no fullness. Left adnexum displays tenderness. Left adnexum displays no mass and no fullness. There is tenderness in the vagina. No erythema or bleeding in the vagina. No foreign body in the vagina. No signs of injury around the vagina. Vaginal discharge (Thin, brownish malordorous discharge) found.  Musculoskeletal: Normal range of motion.  Lymphadenopathy:       Right: No inguinal adenopathy present.       Left: No inguinal adenopathy present.  Neurological: She is alert and oriented to person, place, and time.  Skin: Skin is warm and dry.  Psychiatric: She has a normal mood and affect. Her behavior is normal. Judgment and thought content normal.    Results for orders placed or performed during the hospital encounter of 04/21/18 (from the past 24 hour(s))  Urinalysis, Routine w reflex microscopic     Status: Abnormal   Collection Time: 04/21/18  9:56 AM  Result Value Ref Range   Color, Urine YELLOW YELLOW   APPearance CLEAR CLEAR   Specific Gravity, Urine 1.030 1.005 - 1.030   pH 5.0 5.0 - 8.0   Glucose, UA NEGATIVE NEGATIVE mg/dL   Hgb urine dipstick MODERATE (A) NEGATIVE   Bilirubin Urine NEGATIVE NEGATIVE   Ketones, ur  NEGATIVE NEGATIVE mg/dL   Protein, ur NEGATIVE NEGATIVE mg/dL   Nitrite NEGATIVE NEGATIVE   Leukocytes, UA TRACE (A) NEGATIVE   RBC / HPF 0-5 0 - 5 RBC/hpf   WBC, UA 0-5 0 - 5 WBC/hpf   Bacteria, UA NONE SEEN NONE SEEN   Squamous  Epithelial / LPF 0-5 0 - 5   Mucus PRESENT   Pregnancy, urine POC     Status: None   Collection Time: 04/21/18 10:10 AM  Result Value Ref Range   Preg Test, Ur NEGATIVE NEGATIVE  Wet prep, genital     Status: Abnormal   Collection Time: 04/21/18 10:40 AM  Result Value Ref Range   Yeast Wet Prep HPF POC NONE SEEN NONE SEEN   Trich, Wet Prep NONE SEEN NONE SEEN   Clue Cells Wet Prep HPF POC PRESENT (A) NONE SEEN   WBC, Wet Prep HPF POC MODERATE (A) NONE SEEN   Sperm NONE SEEN   CBC     Status: Abnormal   Collection Time: 04/21/18 11:21 AM  Result Value Ref Range   WBC 5.6 4.0 - 10.5 K/uL   RBC 4.72 3.87 - 5.11 MIL/uL   Hemoglobin 13.0 12.0 - 15.0 g/dL   HCT 09.8 11.9 - 14.7 %   MCV 82.6 80.0 - 100.0 fL   MCH 27.5 26.0 - 34.0 pg   MCHC 33.3 30.0 - 36.0 g/dL   RDW 82.9 56.2 - 13.0 %   Platelets 114 (L) 150 - 400 K/uL   nRBC 0.0 0.0 - 0.2 %  Comprehensive metabolic panel     Status: Abnormal   Collection Time: 04/21/18 11:21 AM  Result Value Ref Range   Sodium 139 135 - 145 mmol/L   Potassium 4.1 3.5 - 5.1 mmol/L   Chloride 108 98 - 111 mmol/L   CO2 22 22 - 32 mmol/L   Glucose, Bld 85 70 - 99 mg/dL   BUN 11 6 - 20 mg/dL   Creatinine, Ser 8.65 0.44 - 1.00 mg/dL   Calcium 8.6 (L) 8.9 - 10.3 mg/dL   Total Protein 6.5 6.5 - 8.1 g/dL   Albumin 3.4 (L) 3.5 - 5.0 g/dL   AST 18 15 - 41 U/L   ALT 22 0 - 44 U/L   Alkaline Phosphatase 56 38 - 126 U/L   Total Bilirubin 0.6 0.3 - 1.2 mg/dL   GFR calc non Af Amer >60 >60 mL/min   GFR calc Af Amer >60 >60 mL/min   Anion gap 9 5 - 15   US Pelvic Complete With Transvaginal  Result Date: 04/21/2018 CLINICAL DATA:  Vaginal bleeding, pelvic pain EXAM: TRANSABDOMINAL AND TRANSVAGINAL ULTRASOUND OF PELVIS  TECHNIQUE: Both transabdominal and transvaginal ultrasound examinations of the pelvis were performed. Transabdominal technique was performed for global imaging of the pelvis including uterus, ovaries, adnexal regions, and pelvic cul-de-sac. It was necessary to proceed with endovaginal exam following the transabdominal exam to visualize the endometrium. COMPARISON:  None FINDINGS: Uterus Measurements: 7.3 x 4.2 x 5.0 cm. No fibroids or other mass visualized. Endometrium Thickness: 2 mm.  No focal abnormality visualized. Right ovary Measurements: 2.9 x 2.0 x 2.1 cm. Normal appearance/no adnexal mass. Left ovary Measurements: 2.7 x 2.0 x 2.3 cm. Normal appearance/no adnexal mass. Other findings Trace pelvic fluid. IMPRESSION: Negative pelvic ultrasound. Electronically Signed   By: Charline Bills M.D.   On: 04/21/2018 12:46    MAU Course  Procedures   MDM  Assessment and Plan  1. BV 2. Chronic pelvic pain Toradol IM Clindamycin for BV. F/u in office in 2 weeks.  Levie Heritage 04/21/2018, 10:34 AM

## 2018-04-24 LAB — GC/CHLAMYDIA PROBE AMP (~~LOC~~) NOT AT ARMC
Chlamydia: NEGATIVE
Neisseria Gonorrhea: NEGATIVE

## 2018-05-03 ENCOUNTER — Ambulatory Visit: Payer: Medicaid Other | Admitting: Physical Therapy

## 2018-05-04 ENCOUNTER — Ambulatory Visit: Payer: Medicaid Other | Admitting: Physical Therapy

## 2018-05-05 ENCOUNTER — Emergency Department (HOSPITAL_COMMUNITY): Payer: Medicaid Other

## 2018-05-05 ENCOUNTER — Encounter (HOSPITAL_COMMUNITY): Payer: Self-pay | Admitting: *Deleted

## 2018-05-05 ENCOUNTER — Emergency Department (HOSPITAL_COMMUNITY)
Admission: EM | Admit: 2018-05-05 | Discharge: 2018-05-05 | Disposition: A | Discharge status: Home / Self Care | Payer: Medicaid Other | Attending: Emergency Medicine | Admitting: Emergency Medicine

## 2018-05-05 DIAGNOSIS — R55 Syncope and collapse: Secondary | ICD-10-CM | POA: Diagnosis present

## 2018-05-05 DIAGNOSIS — N739 Female pelvic inflammatory disease, unspecified: Secondary | ICD-10-CM | POA: Diagnosis not present

## 2018-05-05 DIAGNOSIS — R102 Pelvic and perineal pain: Secondary | ICD-10-CM | POA: Diagnosis not present

## 2018-05-05 LAB — URINALYSIS, ROUTINE W REFLEX MICROSCOPIC
Bilirubin Urine: NEGATIVE
Glucose, UA: NEGATIVE mg/dL
Hgb urine dipstick: NEGATIVE
Ketones, ur: NEGATIVE mg/dL
Nitrite: NEGATIVE
Protein, ur: NEGATIVE mg/dL
Specific Gravity, Urine: 1.008 (ref 1.005–1.030)
pH: 6 (ref 5.0–8.0)

## 2018-05-05 LAB — BASIC METABOLIC PANEL
Anion gap: 10 (ref 5–15)
BUN: 7 mg/dL (ref 6–20)
CO2: 21 mmol/L — ABNORMAL LOW (ref 22–32)
Calcium: 9 mg/dL (ref 8.9–10.3)
Chloride: 107 mmol/L (ref 98–111)
Creatinine, Ser: 0.74 mg/dL (ref 0.44–1.00)
GFR calc Af Amer: >60 mL/min (ref >60)
GFR calc non Af Amer: >60 mL/min (ref >60)
Glucose, Bld: 100 mg/dL — ABNORMAL HIGH (ref 70–99)
Potassium: 3.5 mmol/L (ref 3.5–5.1)
Sodium: 138 mmol/L (ref 135–145)

## 2018-05-05 LAB — CBC
HCT: 42.3 % (ref 36.0–46.0)
Hemoglobin: 13.3 g/dL (ref 12.0–15.0)
MCH: 27.3 pg (ref 26.0–34.0)
MCHC: 31.4 g/dL (ref 30.0–36.0)
MCV: 86.7 fL (ref 80.0–100.0)
Platelets: 159 10*3/uL (ref 150–400)
RBC: 4.88 MIL/uL (ref 3.87–5.11)
RDW: 13.2 % (ref 11.5–15.5)
WBC: 8 10*3/uL (ref 4.0–10.5)
nRBC: 0 % (ref 0.0–0.2)

## 2018-05-05 LAB — HEPATIC FUNCTION PANEL
ALT: 33 U/L (ref 0–44)
AST: 26 U/L (ref 15–41)
Albumin: 3.7 g/dL (ref 3.5–5.0)
Alkaline Phosphatase: 73 U/L (ref 38–126)
Bilirubin, Direct: 0.2 mg/dL (ref 0.0–0.2)
Indirect Bilirubin: 0.5 mg/dL (ref 0.3–0.9)
Total Bilirubin: 0.7 mg/dL (ref 0.3–1.2)
Total Protein: 7.3 g/dL (ref 6.5–8.1)

## 2018-05-05 LAB — WET PREP, GENITAL
Clue Cells Wet Prep HPF POC: NONE SEEN
Sperm: NONE SEEN
Trich, Wet Prep: NONE SEEN
Yeast Wet Prep HPF POC: NONE SEEN

## 2018-05-05 LAB — I-STAT BETA HCG BLOOD, ED (MC, WL, AP ONLY): I-stat hCG, quantitative: <5 m[IU]/mL (ref <5)

## 2018-05-05 LAB — LIPASE, BLOOD: Lipase: 22 U/L (ref 11–51)

## 2018-05-05 LAB — CBG MONITORING, ED: Glucose-Capillary: 70 mg/dL (ref 70–99)

## 2018-05-05 MED ORDER — ONDANSETRON HCL 4 MG PO TABS: 4.0000 mg | ORAL_TABLET | Freq: Four times a day (QID) | ORAL | 0 refills | Status: DC

## 2018-05-05 MED ORDER — DEXTROSE 5 % IV SOLN
250.0000 mg | Freq: Once | INTRAVENOUS | Status: DC
  Filled 2018-05-05: qty 250

## 2018-05-05 MED ORDER — IOHEXOL 300 MG/ML  SOLN
100.0000 mL | Freq: Once | INTRAMUSCULAR | Status: AC | PRN
  Administered 2018-05-05: 100 mL via INTRAVENOUS

## 2018-05-05 MED ORDER — CEFTRIAXONE SODIUM 250 MG IJ SOLR
250.0000 mg | Freq: Once | INTRAMUSCULAR | Status: AC
  Administered 2018-05-05: 250 mg via INTRAMUSCULAR
  Filled 2018-05-05: qty 250

## 2018-05-05 MED ORDER — CLINDAMYCIN HCL 300 MG PO CAPS: 300.0000 mg | ORAL_CAPSULE | Freq: Two times a day (BID) | ORAL | 0 refills | Status: DC

## 2018-05-05 MED ORDER — ACETAMINOPHEN 500 MG PO TABS
1000.0000 mg | ORAL_TABLET | Freq: Once | ORAL | Status: AC
  Administered 2018-05-05: 1000 mg via ORAL
  Filled 2018-05-05: qty 2

## 2018-05-05 MED ORDER — DOXYCYCLINE HYCLATE 100 MG PO CAPS: 100.0000 mg | ORAL_CAPSULE | Freq: Two times a day (BID) | ORAL | 0 refills | Status: DC

## 2018-05-05 MED ORDER — SODIUM CHLORIDE 0.9 % IV BOLUS
1000.0000 mL | Freq: Once | INTRAVENOUS | Status: AC
  Administered 2018-05-05: 1000 mL via INTRAVENOUS

## 2018-05-05 MED ORDER — ONDANSETRON HCL 4 MG/2ML IJ SOLN
4.0000 mg | Freq: Once | INTRAMUSCULAR | Status: AC
  Administered 2018-05-05: 4 mg via INTRAVENOUS
  Filled 2018-05-05: qty 2

## 2018-05-05 MED ORDER — LIDOCAINE HCL (PF) 1 % IJ SOLN
INTRAMUSCULAR | Status: AC
  Administered 2018-05-05: 0.9 mL
  Filled 2018-05-05: qty 5

## 2018-05-05 MED ORDER — HYDROCODONE-ACETAMINOPHEN 5-325 MG PO TABS: 1.0000 | ORAL_TABLET | ORAL | 0 refills | Status: DC | PRN

## 2018-05-05 NOTE — Discharge Instructions (Addendum)
CT scan tonight did not show any obvious abnormalities. I am going to treat you for PID again.  Prescription for 2 antibiotics, pain medicine, nausea medicine.  Follow-up with your OB/GYN doctor.

## 2018-05-05 NOTE — ED Triage Notes (Signed)
Pt in via EMS to triage, c/o n/v/d x3 days, social worker saw her today and said she had a possible syncopal episode, CBG 153, no EKG changes, no distress noted, 4mg  zofran IM

## 2018-05-06 NOTE — ED Provider Notes (Signed)
[facility] EMERGENCY DEPARTMENT Provider Note   CSN: [id number] Arrival date & time: 05/05/18  1054     History   Chief Complaint Chief Complaint  Patient presents with  . Loss of Consciousness    HPI Chloe Rodgers is a 27 y.o. female.  Patient presents with a concern for lower pelvic pain for unknown length of time.  She has been treated recently for PID, UTI, BV.  Additionally, she has c/oed of nausea with some vomiting, weakness, headache, questionable syncopal spell.  Fever, sweats, chills, dysuria, vaginal discharge, chest pain, dyspnea..  She has not been sexually active in several months.  Severity symptoms is moderate.  Palpation makes abdominal pain worse.     Past Medical History:  Diagnosis Date  . Anemia   . Anxiety   . Chlamydia   . Complication of anesthesia    ??, seizure with wisdom teeth  . Depression    not on meds, sees a therapist  . Infection    UTI  . Kidney stone    kidney stones  . Migraine   . PID (acute pelvic inflammatory disease) 2014  . PONV (postoperative nausea and vomiting)   . Psoriasis   . Seizures [facility])    July 2013 - [place]    Patient Active Problem List   Diagnosis Date Noted  . Chronic PID (chronic pelvic inflammatory disease) 12/13/2017  . Pelvic inflammatory disease 09/29/2017  . Status post repeat low transverse cesarean section 03/30/2017  . Depression affecting pregnancy in second trimester, antepartum 01/05/2017  . Supervision of normal pregnancy 10/06/2016  . Chlamydia infection affecting pregnancy in first trimester 08/23/2016  . Pelvic inflammatory disease (PID) 02/21/2013  . Migraines 07/24/2012  . Previous cesarean delivery, antepartum condition or complication 03/30/2012  . Obese 03/30/2012  . Seizure disorder (HCC) 02/03/2012  . Family history of breast cancer in mother 02/03/2012    Past Surgical History:  Procedure Laterality Date  . CESAREAN SECTION    . CESAREAN SECTION   06/06/2012   Procedure: CESAREAN SECTION;  Surgeon: Adam Phenix, MD;  Location: [facility] ORS;  Service: Obstetrics;  Laterality: N/A;  . CESAREAN SECTION N/A 03/30/2017   Procedure: REPEAT CESAREAN SECTION;  Surgeon: Lazaro Arms, MD;  Location: [facility] BIRTHING SUITES;  Service: Obstetrics;  Laterality: N/A;  . SKIN GRAFT     off abd, onto arm  . WISDOM TOOTH EXTRACTION       OB History    Gravida  5   Para  3   Term  3   Preterm  0   AB  2   Living  3     SAB  0   TAB  1   Ectopic  0   Multiple  0   Live Births  3            Home Medications    Prior to Admission medications   Medication Sig Start Date End Date Taking? Authorizing Provider  clindamycin (CLEOCIN) 300 MG capsule Take 1 capsule (300 mg total) by mouth 2 (two) times daily. 05/05/18   Donnetta Hutching, MD  doxycycline (VIBRAMYCIN) 100 MG capsule Take 1 capsule (100 mg total) by mouth 2 (two) times daily. 05/05/18   Donnetta Hutching, MD  HYDROcodone-acetaminophen (NORCO/VICODIN) 5-325 MG tablet Take 1 tablet by mouth every 4 (four) hours as needed. 05/05/18   Donnetta Hutching, MD  ondansetron (ZOFRAN) 4 MG tablet Take 1 tablet (4 mg total) by mouth every 6 (six)  hours. 05/05/18   Donnetta Hutching, MD    Family History Family History  Problem Relation Age of Onset  . Hypertension Mother   . Diabetes Mother   . Cancer Mother        BREAST  . Stroke Mother   . Seizures Mother   . Asthma Daughter   . Hypertension Maternal Grandmother   . Diabetes Maternal Grandmother   . Cancer Maternal Grandmother        BONE CANCER  . Other Neg Hx     Social History Social History   Tobacco Use  . Smoking status: Never Smoker  . Smokeless tobacco: Never Used  Substance Use Topics  . Alcohol use: No    Comment: socially but none with pregnancy  . Drug use: No     Allergies   Naproxen and Toradol [ketorolac tromethamine]   Review of Systems Review of Systems  All other systems reviewed and are  negative.    Physical Exam Updated Vital Signs BP 112/72   Pulse 81   Temp 97.8 F (36.6 C) (Oral)   Resp 18   LMP  (LMP Unknown)   SpO2 100%   Physical Exam  Constitutional: She is oriented to person, place, and time. She appears well-developed and well-nourished.  HENT:  Head: Normocephalic and atraumatic.  Eyes: Conjunctivae are normal.  Neck: Neck supple.  Cardiovascular: Normal rate and regular rhythm.  Pulmonary/Chest: Effort normal and breath sounds normal.  Abdominal: Soft. Bowel sounds are normal.  Tender suprapubic area  Genitourinary:  Genitourinary Comments: External genitalia normal.  Vaginal access is very tight.  Creamy white vaginal discharge.  Positive cervical motion tenderness.  Musculoskeletal: Normal range of motion.  Neurological: She is alert and oriented to person, place, and time.  Skin: Skin is warm and dry.  Psychiatric: She has a normal mood and affect. Her behavior is normal.  Nursing note and vitals reviewed.    ED Treatments / Results  Labs (all labs ordered are listed, but only abnormal results are displayed) Labs Reviewed  WET PREP, GENITAL - Abnormal; Notable for the following components:      Result Value   WBC, Wet Prep HPF POC MODERATE (*)    All other components within normal limits  BASIC METABOLIC PANEL - Abnormal; Notable for the following components:   CO2 21 (*)    Glucose, Bld 100 (*)    All other components within normal limits  URINALYSIS, ROUTINE W REFLEX MICROSCOPIC - Abnormal; Notable for the following components:   APPearance HAZY (*)    Leukocytes, UA TRACE (*)    Bacteria, UA RARE (*)    All other components within normal limits  CBC  HEPATIC FUNCTION PANEL  LIPASE, BLOOD  CBG MONITORING, ED  I-STAT BETA HCG BLOOD, ED ([facility], [facility], [facility] ONLY)  GC/CHLAMYDIA PROBE AMP ([place]) NOT AT [facility]    EKG EKG Interpretation  Date/Time:  Friday May 05 2018 11:09:09 EDT Ventricular Rate:  80 PR Interval:  146 QRS  Duration: 82 QT Interval:  380 QTC Calculation: 438 R Axis:   74 Text Interpretation:  Normal sinus rhythm Normal ECG Confirmed by Donnetta Hutching ([id number]) on 05/05/2018 6:01:32 PM   Radiology Ct Abdomen Pelvis W Contrast  Result Date: 05/05/2018 CLINICAL DATA:  Abdominal pain possible syncopal episode EXAM: CT ABDOMEN AND PELVIS WITH CONTRAST TECHNIQUE: Multidetector CT imaging of the abdomen and pelvis was performed using the standard protocol following bolus administration of intravenous contrast. CONTRAST:  OMNIPAQUE  IOHEXOL 300 MG/ML  SOLN COMPARISON:  CT 12/13/2017 FINDINGS: Lower chest: No acute abnormality. Hepatobiliary: No focal liver abnormality is seen. No gallstones, gallbladder wall thickening, or biliary dilatation. Pancreas: Unremarkable. No pancreatic ductal dilatation or surrounding inflammatory changes. Spleen: Normal in size without focal abnormality. Adrenals/Urinary Tract: Adrenal glands are normal. Punctate stones in the upper and lower pole of right kidney. No hydronephrosis. Bladder normal Stomach/Bowel: Stomach is within normal limits. Appendix appears normal. No evidence of bowel wall thickening, distention, or inflammatory changes. Vascular/Lymphatic: No significant vascular findings are present. No enlarged abdominal or pelvic lymph nodes. Reproductive: Uterus and bilateral adnexa are unremarkable. Other: Negative for free air or free fluid Musculoskeletal: No acute or significant osseous findings. IMPRESSION: 1. No CT evidence for acute intra-abdominal or pelvic abnormality 2. Nonobstructing stones in the right kidney Electronically Signed   By: Jasmine Pang M.D.   On: 05/05/2018 19:52    Procedures Procedures (including critical care time)  Medications Ordered in ED Medications  ondansetron (ZOFRAN) injection 4 mg (4 mg Intravenous Given 05/05/18 1839)  sodium chloride 0.9 % bolus 1,000 mL (0 mLs Intravenous Stopped 05/05/18 2220)  acetaminophen (TYLENOL) tablet  1,000 mg (1,000 mg Oral Given 05/05/18 1840)  iohexol (OMNIPAQUE) 300 MG/ML solution 100 mL (100 mLs Intravenous Contrast Given 05/05/18 1933)  cefTRIAXone (ROCEPHIN) injection 250 mg (250 mg Intramuscular Given 05/05/18 2158)  lidocaine (PF) (XYLOCAINE) 1 % injection (0.9 mLs  Given 05/05/18 2158)     Initial Impression / Assessment and Plan / ED Course  I have reviewed the triage vital signs and the nursing notes.  Pertinent labs & imaging results that were available during my care of the patient were reviewed by me and considered in my medical decision making (see chart for details).     Patient presents with a constellation of symptoms, but most importantly she complains of persistent pelvic pain.  Clinical exam consistent with PID.  CT shows no acute findings.  EKG and labs reassuring.  IV fluids and IV Rocephin given in the emergency department.  Discharge medications doxycycline 100 mg, clindamycin 300 mg, Vicodin, Zofran 4 mg.  She has primary care follow-up.  Final Clinical Impressions(s) / ED Diagnoses   Final diagnoses:  PID (pelvic inflammatory disease)    ED Discharge Orders         Ordered    doxycycline (VIBRAMYCIN) 100 MG capsule  2 times daily     05/05/18 2156    clindamycin (CLEOCIN) 300 MG capsule  2 times daily     05/05/18 2156    HYDROcodone-acetaminophen (NORCO/VICODIN) 5-325 MG tablet  Every 4 hours PRN     05/05/18 2156    ondansetron (ZOFRAN) 4 MG tablet  Every 6 hours     05/05/18 2156           Donnetta Hutching, MD 05/06/18 2228

## 2018-05-09 LAB — GC/CHLAMYDIA PROBE AMP (~~LOC~~) NOT AT ARMC
Chlamydia: NEGATIVE
Neisseria Gonorrhea: NEGATIVE

## 2018-05-15 ENCOUNTER — Ambulatory Visit: Payer: Medicaid Other | Admitting: Physical Therapy

## 2018-05-18 ENCOUNTER — Ambulatory Visit: Payer: Medicaid Other | Admitting: Physical Therapy

## 2018-05-31 ENCOUNTER — Other Ambulatory Visit: Payer: Self-pay

## 2018-05-31 ENCOUNTER — Ambulatory Visit: Payer: Medicaid Other | Attending: Nurse Practitioner | Admitting: Physical Therapy

## 2018-05-31 DIAGNOSIS — M6281 Muscle weakness (generalized): Secondary | ICD-10-CM | POA: Diagnosis present

## 2018-05-31 DIAGNOSIS — R252 Cramp and spasm: Secondary | ICD-10-CM | POA: Diagnosis present

## 2018-05-31 DIAGNOSIS — M545 Low back pain, unspecified: Secondary | ICD-10-CM

## 2018-05-31 DIAGNOSIS — G8929 Other chronic pain: Secondary | ICD-10-CM | POA: Diagnosis present

## 2018-05-31 NOTE — Patient Instructions (Signed)
About Abdominal Massage  Abdominal massage, also called external colon massage, is a self-treatment circular massage technique that can reduce and eliminate gas and ease constipation. The colon naturally contracts in waves in a clockwise direction starting from inside the right hip, moving up toward the ribs, across the belly, and down inside the left hip.  When you perform circular abdominal massage, you help stimulate your colon's normal wave pattern of movement called peristalsis.  It is most beneficial when done after eating.  Positioning You can practice abdominal massage with oil while lying down, or in the shower with soap.  Some people find that it is just as effective to do the massage through clothing while sitting or standing.  How to Massage Start by placing your finger tips or knuckles on your right side, just inside your hip bone.  . Make small circular movements while you move upward toward your rib cage.   . Once you reach the bottom right side of your rib cage, take your circular movements across to the left side of the bottom of your rib cage.  . Next, move downward until you reach the inside of your left hip bone.  This is the path your feces travel in your colon. . Continue to perform your abdominal massage in this pattern for 10 minutes each day.     You can apply as much pressure as is comfortable in your massage.  Start gently and build pressure as you continue to practice.  Notice any areas of pain as you massage; areas of slight pain may be relieved as you massage, but if you have areas of significant or intense pain, consult with your healthcare provider.  Other Considerations . General physical activity including bending and stretching can have a beneficial massage-like effect on the colon.  Deep breathing can also stimulate the colon because breathing deeply activates the same nervous system that supplies the colon.   . Abdominal massage should always be used in  combination with a bowel-conscious diet that is high in the proper type of fiber for you, fluids (primarily water), and a regular exercise program.  Toileting Techniques for Bowel Movements (Defecation) Using your belly (abdomen) and pelvic floor muscles to have a bowel movement is usually instinctive.  Sometimes people can have problems with these muscles and have to relearn proper defecation (emptying) techniques.  If you have weakness in your muscles, organs that are falling out, decreased sensation in your pelvis, or ignore your urge to go, you may find yourself straining to have a bowel movement.  You are straining if you are: . holding your breath or taking in a huge gulp of air and holding it  . keeping your lips and jaw tensed and closed tightly . turning red in the face because of excessive pushing or forcing . developing or worsening your  hemorrhoids . getting faint while pushing . not emptying completely and have to defecate many times a day  If you are straining, you are actually making it harder for yourself to have a bowel movement.  Many people find they are pulling up with the pelvic floor muscles and closing off instead of opening the anus. Due to lack pelvic floor relaxation and coordination the abdominal muscles, one has to work harder to push the feces out.  Many people have never been taught how to defecate efficiently and effectively.  Notice what happens to your body when you are having a bowel movement.  While you are sitting on   the toilet pay attention to the following areas: . Jaw and mouth position . Angle of your hips   . Whether your feet touch the ground or not . Arm placement  . Spine position . Waist . Belly tension . Anus (opening of the anal canal)  An Evacuation/Defecation Plan   Here are the 4 basic points:  1. Lean forward enough for your elbows to rest on your knees 2. Support your feet on the floor or use a low stool if your feet don't touch the  floor  3. Push out your belly as if you have swallowed a beach ball-you should feel a widening of your waist 4. Open and relax your pelvic floor muscles, rather than tightening around the anus      The following conditions my require modifications to your toileting posture:  . If you have had surgery in the past that limits your back, hip, pelvic, knee or ankle flexibility . Constipation   Your healthcare practitioner may make the following additional suggestions and adjustments:  1) Sit on the toilet  a) Make sure your feet are supported. b) Notice your hip angle and spine position-most people find it effective to lean forward or raise their knees, which can help the muscles around the anus to relax  c) When you lean forward, place your forearms on your thighs for support  2) Relax suggestions a) Breath deeply in through your nose and out slowly through your mouth as if you are smelling the flowers and blowing out the candles. b) To become aware of how to relax your muscles, contracting and releasing muscles can be helpful.  Pull your pelvic floor muscles in tightly by using the image of holding back gas, or closing around the anus (visualize making a circle smaller) and lifting the anus up and in.  Then release the muscles and your anus should drop down and feel open. Repeat 5 times ending with the feeling of relaxation. c) Keep your pelvic floor muscles relaxed; let your belly bulge out. d) The digestive tract starts at the mouth and ends at the anal opening, so be sure to relax both ends of the tube.  Place your tongue on the roof of your mouth with your teeth separated.  This helps relax your mouth and will help to relax the anus at the same time.  3) Empty (defecation) a) Keep your pelvic floor and sphincter relaxed, then bulge your anal muscles.  Make the anal opening wide.  b) Stick your belly out as if you have swallowed a beach ball. c) Make your belly wall hard using your belly  muscles while continuing to breathe. Doing this makes it easier to open your anus. d) Breath out and give a grunt (or try using other sounds such as ahhhh, shhhhh, ohhhh or grrrrrrr).  4) Finish a) As you finish your bowel movement, pull the pelvic floor muscles up and in.  This will leave your anus in the proper place rather than remaining pushed out and down. If you leave your anus pushed out and down, it will start to feel as though that is normal and give you incorrect signals about needing to have a bowel movement.    [facility] [address] [place], [place] [address]  

## 2018-05-31 NOTE — Therapy (Signed)
[facility] [address], [address] [place], [place], [address] Phone: [phone]   Fax:  [phone]  Physical Therapy Evaluation  Patient Details  Name: Chloe Rodgers MRN: [id number] Date of Birth: [birth year] Referring Provider (PT): Carmel Sacramento, NP    Encounter Date: 05/31/2018  PT End of Session - 05/31/18 1024    Visit Number  1    Date for PT Re-Evaluation  08/23/18    Authorization Type  medicaid    PT Start Time  [phone]    PT Stop Time  1000    PT Time Calculation (min)  29 min    Activity Tolerance  Patient limited by pain    Behavior During Therapy  [facility] for tasks assessed/performed       Past Medical History:  Diagnosis Date  . Anemia   . Anxiety   . Chlamydia   . Complication of anesthesia    ??, seizure with wisdom teeth  . Depression    not on meds, sees a therapist  . Infection    UTI  . Kidney stone    kidney stones  . Migraine   . PID (acute pelvic inflammatory disease) 2014  . PONV (postoperative nausea and vomiting)   . Psoriasis   . Seizures [facility])    July 2013 - [place]    Past Surgical History:  Procedure Laterality Date  . CESAREAN SECTION    . CESAREAN SECTION  06/06/2012   Procedure: CESAREAN SECTION;  Surgeon: Adam Phenix, MD;  Location: [facility];  Service: Obstetrics;  Laterality: N/A;  . CESAREAN SECTION N/A 03/30/2017   Procedure: REPEAT CESAREAN SECTION;  Surgeon: Lazaro Arms, MD;  Location: [facility];  Service: Obstetrics;  Laterality: N/A;  . SKIN GRAFT     off abd, onto arm  . WISDOM TOOTH EXTRACTION      There were no vitals filed for this visit.   Subjective Assessment - 05/31/18 0931    Subjective  I have had pain for years and chronic infections and UTIs.  I have pain all the time.      How long can you sit comfortably?  30-60    How long can you walk comfortably?  30-60    Patient Stated Goals  being able to do more things and have more energy     Currently in Pain?  Yes    Pain Score  8     Pain Location  Perineum    Pain Descriptors / Indicators  Sharp;Dull    Pain Type  Chronic pain    Pain Radiating Towards  into low back    Pain Onset  More than a month ago   3-4 years   Aggravating Factors   worse at night    Pain Relieving Factors  pain medicine calms it down    Effect of Pain on Daily Activities  need help making dinner, and helping with kids    Multiple Pain Sites  No         [facility] PT Assessment - 05/31/18 0001      Assessment   Medical Diagnosis  PELVIC PAIN    Referring Provider (PT)  Carmel Sacramento, NP     Onset Date/Surgical Date  --   3-4 years   Prior Therapy  No      Precautions   Precautions  None      Restrictions   Weight Bearing Restrictions  No  Balance Screen   Has the patient fallen in the past 6 months  No      Home Environment   Living Environment  Private residence    Living Arrangements  Children   3 children     Prior Function   Level of Independence  Independent    Vocation  On disability      Cognition   Overall Cognitive Status  Within Functional Limits for tasks assessed      Posture/Postural Control   Posture/Postural Control  Postural limitations    Postural Limitations  Rounded Shoulders;Increased thoracic kyphosis      ROM / Strength   AROM / PROM / Strength  PROM;Strength      PROM   PROM Assessment Site  Hip    Right/Left Hip  Right;Left    Right Hip External Rotation   45    Right Hip Internal Rotation   10    Left Hip Flexion  90    Left Hip External Rotation   50    Left Hip Internal Rotation   20      Strength   Strength Assessment Site  Hip    Right/Left Hip  Right;Left    Right Hip Flexion  4-/5    Right Hip External Rotation   4-/5    Right Hip ABduction  4-/5    Right Hip ADduction  4-/5    Left Hip Flexion  4/5    Left Hip External Rotation  4/5    Left Hip ABduction  4-/5    Left Hip ADduction  4-/5      Right Hip   Right Hip Flexion   90      Flexibility   Soft Tissue Assessment /Muscle Length  yes    Hamstrings  45 deg bilat +pain at end range      Palpation   Palpation comment  tight and tender adductor muscles      Ambulation/Gait   Gait Pattern  Within Functional Limits                Objective measurements completed on examination: See above findings.    Pelvic Floor Special Questions - 05/31/18 0001    Prior Pelvic/Prostate Exam  Yes    Date of Last Pelvic/Prostate Exam  --   last month   Are you Pregnant or attempting pregnancy?  No    Prior Pregnancies  Yes    Number of C-Sections  3    Currently Sexually Active  No    Marinoff Scale  pain prevents any attempts at intercourse   last time insertion was painful   Urinary Leakage  No   sometimes cannot urinate   Urinary frequency  low - 1x/day    Fecal incontinence  No   constipated, small balls   Fluid intake  apple juice, fruit punch, ice tea - 4-5 glasses/day    External Perineal Exam  external palpation- external to clothing due to holding her infant and nowhere to put him     External Palpation  severely tender pubic symphasis, ischiocavernosis, TP attachments bilaterally       [facility] Adult PT Treatment/Exercise - 05/31/18 0001      Self-Care   Self-Care  Other Self-Care Comments    Other Self-Care Comments   abdominal massage and toilet techniques; h/s and piriformis stretch             PT Education - 05/31/18 1001    Education Details  Access Code: [email or web address], abdominal massage and toileting techniques    Person(s) Educated  Patient    Methods  Explanation;Demonstration;Handout;Verbal cues    Comprehension  Verbalized understanding;Returned demonstration       PT Short Term Goals - 05/31/18 1328      PT SHORT TERM GOAL #1   Title  ind with initial HEP    Time  4    Period  Weeks    Status  New    Target Date  06/28/18      PT SHORT TERM GOAL #2   Title  pt will tolerate internal soft tissue assessment and  treatment as needed    Time  4    Period  Weeks    Status  New    Target Date  06/28/18        PT Long Term Goals - 05/31/18 1058      PT LONG TERM GOAL #1   Title  pt will be ind with advanced HEP for pain management    Baseline  has not learned    Time  12    Period  Weeks    Status  New    Target Date  08/23/18      PT LONG TERM GOAL #2   Title  Pt will report 50% less pain during daily activities so she can perform tasks such as meal prep without assistance    Baseline  8/10 pain    Time  12    Period  Weeks    Status  New    Target Date  08/23/18      PT LONG TERM GOAL #3   Title  Pt will demonstrate 4+/5 MMT on bilateral hip flexion and abduction for improved upright posture during functional activities.    Baseline  4-/5    Time  12    Period  Weeks    Status  New    Target Date  08/23/18      PT LONG TERM GOAL #4   Title  pt will be able to perfrom functional transfers without increased pain due to increased core strength.    Baseline  pain increases    Time  12    Period  Weeks    Status  New    Target Date  08/23/18             Plan - 05/31/18 1026    Clinical Impression Statement  Pt presents to skilled PT due to chronic pelvic pain that radiates into her low back and low abdomen.  Pt attended PT with infant and had to hold him the entire time so unable to do internal assessment and will plan on doing this at next session as patient reports she can find someone to look after him while she comes to PT.  Assessment of pelvic floor outside of clothing revealed significant tenderness throughout as mentioned above. She also demonstrates hip weakness and decreased ROM with pain at end ranges.  Pt has limited hamstring flexibility with 45 deg of flexion supine. Pt has restrictions of fascia in abdomen especially at c-section incision site.  Tight and tender hip flexors palpated bilaterally.  Pt will benefit from skilled PT to address all impairments and return  to improved function for return to maximum function.    History and Personal Factors relevant to plan of care:  chronic UTIs, chronic pain, multiple pregnancies, 3 c-sections, scoliosis    Clinical Presentation  Evolving  Clinical Presentation due to:  pain has gotten worse and having more infections    Clinical Decision Making  Moderate    Rehab Potential  Excellent    Clinical Impairments Affecting Rehab Potential  scoliosis, chronic pain    PT Frequency  1x / week    PT Duration  12 weeks    PT Treatment/Interventions  ADLs/Self Care Home Management;Biofeedback;Cryotherapy;Academic librarian;Therapeutic activities;Therapeutic exercise;Neuromuscular re-education;Patient/family education;Manual techniques;Dry needling;Scar mobilization;Taping;Passive range of motion    PT Next Visit Plan  internal STM assessment, breathing and bulging, sitting on ball, lumbar and hip stretches, meditation relax    PT Home Exercise Plan  Access Code: [email or web address]    Recommended Other Services  eval 05/31/18    Consulted and Agree with Plan of Care  Patient       Patient will benefit from skilled therapeutic intervention in order to improve the following deficits and impairments:  Pain, Increased fascial restricitons  Visit Diagnosis: Muscle weakness (generalized)  Cramp and spasm  Chronic low back pain without sciatica, unspecified back pain laterality     Problem List Patient Active Problem List   Diagnosis Date Noted  . Chronic PID (chronic pelvic inflammatory disease) 12/13/2017  . Pelvic inflammatory disease 09/29/2017  . Status post repeat low transverse cesarean section 03/30/2017  . Depression affecting pregnancy in second trimester, antepartum 01/05/2017  . Supervision of normal pregnancy 10/06/2016  . Chlamydia infection affecting pregnancy in first trimester 08/23/2016  . Pelvic inflammatory disease (PID) 02/21/2013  . Migraines 07/24/2012  . Previous  cesarean delivery, antepartum condition or complication 03/30/2012  . Obese 03/30/2012  . Seizure disorder (HCC) 02/03/2012  . Family history of breast cancer in mother 02/03/2012    Vincente Poli, PT 05/31/2018, 1:28 PM  [facility] [address], [address] [place], [place], [address] Phone: [phone]   Fax:  [phone]  Name: Chloe Rodgers MRN: [id number] Date of Birth: 26-Apr-1991

## 2018-06-01 ENCOUNTER — Encounter: Payer: Medicaid Other | Admitting: Obstetrics and Gynecology

## 2018-06-19 ENCOUNTER — Ambulatory Visit: Payer: Medicaid Other | Admitting: Physical Therapy

## 2018-06-29 ENCOUNTER — Ambulatory Visit: Payer: Medicaid Other | Attending: Nurse Practitioner | Admitting: Physical Therapy

## 2018-07-18 ENCOUNTER — Encounter: Payer: Self-pay | Admitting: Physical Therapy

## 2018-07-18 ENCOUNTER — Ambulatory Visit: Payer: Medicaid Other | Attending: Nurse Practitioner | Admitting: Physical Therapy

## 2018-07-18 DIAGNOSIS — M6281 Muscle weakness (generalized): Secondary | ICD-10-CM | POA: Insufficient documentation

## 2018-07-18 DIAGNOSIS — R252 Cramp and spasm: Secondary | ICD-10-CM | POA: Diagnosis present

## 2018-07-18 DIAGNOSIS — G8929 Other chronic pain: Secondary | ICD-10-CM | POA: Diagnosis present

## 2018-07-18 DIAGNOSIS — M545 Low back pain: Secondary | ICD-10-CM | POA: Insufficient documentation

## 2018-07-18 NOTE — Therapy (Addendum)
[facility] [address], [place], [place], [address] Phone: [phone]   Fax:  [phone]  Physical Therapy Treatment  Patient Details  Name: Chloe Rodgers MRN: [id number] Date of Birth: [birth year] Referring Provider (PT): Emelia Loron, NP    Encounter Date: 07/18/2018  PT End of Session - 07/18/18 0925    Visit Number  2    Date for PT Re-Evaluation  08/23/18    Authorization Type  medicaid    Authorization - Visit Number  1    Authorization - Number of Visits  3    PT Start Time  [phone]    PT Stop Time  1005    PT Time Calculation (min)  40 min    Activity Tolerance  Patient limited by pain;Patient tolerated treatment well    Behavior During Therapy  [facility] for tasks assessed/performed       Past Medical History:  Diagnosis Date  . Anemia   . Anxiety   . Chlamydia   . Complication of anesthesia    ??, seizure with wisdom teeth  . Depression    not on meds, sees a therapist  . Infection    UTI  . Kidney stone    kidney stones  . Migraine   . PID (acute pelvic inflammatory disease) 2014  . PONV (postoperative nausea and vomiting)   . Psoriasis   . Seizures [facility])    July 2013 - [place]    Past Surgical History:  Procedure Laterality Date  . CESAREAN SECTION    . CESAREAN SECTION  06/06/2012   Procedure: CESAREAN SECTION;  Surgeon: Woodroe Mode, MD;  Location: [place] [facility];  Service: Obstetrics;  Laterality: N/A;  . CESAREAN SECTION N/A 03/30/2017   Procedure: REPEAT CESAREAN SECTION;  Surgeon: Florian Buff, MD;  Location: [place];  Service: Obstetrics;  Laterality: N/A;  . SKIN GRAFT     off abd, onto arm  . WISDOM TOOTH EXTRACTION      There were no vitals filed for this visit.  Subjective Assessment - 07/18/18 0926    Subjective  My pain is in the pelvic area and just constant    Currently in Pain?  Yes    Pain Score  8     Pain Location  Pelvis                     Pelvic Floor Special Questions - 07/18/18 0001    Exam Type  Vaginal    Palpation  very tneder and tight throughout    Strength  fair squeeze, definite lift    Strength # of seconds  2    Tone  high        [facility] Adult PT Treatment/Exercise - 07/18/18 0001      Self-Care   Other Self-Care Comments   moisturizers and pelvic stretch      Neuro Re-ed    Neuro Re-ed Details   breathing and bulging pelvic floor      Lumbar Exercises: Stretches   Active Hamstring Stretch  Right;Left;2 reps;20 seconds    Single Knee to Chest Stretch  Right;Left;2 reps;10 seconds    Lower Trunk Rotation  3 reps;10 seconds      Manual Therapy   Manual Therapy  Soft tissue mobilization;Internal Pelvic Floor    Manual therapy comments  pt identity confirmed and informed consent given to perform internal soft tissue assessment and treatment    Soft tissue mobilization  lumbar paraspinals and glutes    Internal Pelvic Floor  bulbocavernosis fascial release Rt>Lt             PT Education - 07/18/18 1004    Education Details   Access Code: [email or web address], moisturizer and self massage    Person(s) Educated  Patient    Methods  Explanation;Demonstration;Handout;Verbal cues    Comprehension  Verbalized understanding;Returned demonstration       PT Short Term Goals - 07/18/18 1115      PT SHORT TERM GOAL #1   Title  ind with initial HEP    Status  On-going      PT SHORT TERM GOAL #2   Title  pt will tolerate internal soft tissue assessment and treatment as needed    Status  Achieved        PT Long Term Goals - 05/31/18 1058      PT LONG TERM GOAL #1   Title  pt will be ind with advanced HEP for pain management    Baseline  has not learned    Time  12    Period  Weeks    Status  New    Target Date  08/23/18      PT LONG TERM GOAL #2   Title  Pt will report 50% less pain during daily activities so she can perform tasks such as meal prep without assistance     Baseline  8/10 pain    Time  12    Period  Weeks    Status  New    Target Date  08/23/18      PT LONG TERM GOAL #3   Title  Pt will demonstrate 4+/5 MMT on bilateral hip flexion and abduction for improved upright posture during functional activities.    Baseline  4-/5    Time  12    Period  Weeks    Status  New    Target Date  08/23/18      PT LONG TERM GOAL #4   Title  pt will be able to perfrom functional transfers without increased pain due to increased core strength.    Baseline  pain increases    Time  12    Period  Weeks    Status  New    Target Date  08/23/18            Plan - 07/18/18 1115    Clinical Impression Statement  Pt responded well from soft tissue mobilization of low back and bulbospongeosis.  Pt was able to understand how to do diaphragmatic breathing and self massage with moisturizer to improved soft tissue.  Pt demonstrates high tone pelvic floor and tension in lumbar and glute muscles.  She will benefit from skilled PT to improved muscle coordination and learn how to manage pain.    PT Treatment/Interventions  ADLs/Self Care Home Management;Biofeedback;Cryotherapy;Software engineer;Therapeutic activities;Therapeutic exercise;Neuromuscular re-education;Patient/family education;Manual techniques;Dry needling;Scar mobilization;Taping;Passive range of motion    PT Next Visit Plan  internal STM, lumbar and hip stretches, meditation relax    PT Home Exercise Plan  Access Code: [email or web address]; Access Code: [email or web address]    Consulted and Agree with Plan of Care  Patient       Patient will benefit from skilled therapeutic intervention in order to improve the following deficits and impairments:  Pain, Increased fascial restricitons  Visit Diagnosis: Muscle weakness (generalized)  Cramp and spasm  Chronic low back pain without sciatica, unspecified back pain laterality  Problem List Patient Active Problem List   Diagnosis Date Noted   . Chronic PID (chronic pelvic inflammatory disease) 12/13/2017  . Pelvic inflammatory disease 09/29/2017  . Status post repeat low transverse cesarean section 03/30/2017  . Depression affecting pregnancy in second trimester, antepartum 01/05/2017  . Supervision of normal pregnancy 10/06/2016  . Chlamydia infection affecting pregnancy in first trimester 08/23/2016  . Pelvic inflammatory disease (PID) 02/21/2013  . Migraines 07/24/2012  . Previous cesarean delivery, antepartum condition or complication 20/26/6916  . Obese 03/30/2012  . Seizure disorder ([place]) 02/03/2012  . Family history of breast cancer in mother 02/03/2012    Zannie Cove, PT 07/18/2018, 11:33 AM  [facility] [address] [place], [place], [address] Phone: [phone]   Fax:  [phone]  Name: Chloe Rodgers MRN: [id number] Date of Birth: [birth year]  PHYSICAL THERAPY DISCHARGE SUMMARY  Visits from Start of Care: 2  Current functional level related to goals / functional outcomes: See above goals   Remaining deficits: See above    Education / Equipment: HEP  Plan: Patient agrees to discharge.  Patient goals were not met. Patient is being discharged due to a change in medical status.  ?????    Patient called to inform clinic of having to stay at the hospital and unable to come to PT.  She was recommended to get another referral to return when she is ready.  Zannie Cove, PT 08/01/18 9:58 AM

## 2018-07-18 NOTE — Patient Instructions (Addendum)
STRETCHING THE PELVIC FLOOR MUSCLES NO DILATOR  Supplies . Vaginal lubricant . Mirror (optional) . Gloves (optional) Positioning . Start in a semi-reclined position with your head propped up. Bend your knees and place your thumb or finger at the vaginal opening. Procedure . Apply a moderate amount of lubricant on the outer skin of your vagina, the labia minora.  Apply additional lubricant to your finger. Marland Kitchen Spread the skin away from the vaginal opening. Place the end of your finger at the opening. . Do a maximum contraction of the pelvic floor muscles. Tighten the vagina and the anus maximally and relax. . When you know they are relaxed, gently and slowly insert your finger into your vagina, directing your finger slightly downward, for 2-3 inches of insertion. . Relax and stretch the 6 o'clock position . Hold each stretch for _2 min__ and repeat __1_ time with rest breaks of _1__ seconds between each stretch. . Repeat the stretching in the 4 o'clock and 8 o'clock positions. . Total time should be _6__ minutes, _1__ x per day.  Note the amount of theme your were able to achieve and your tolerance to your finger in your vagina. . Once you have accomplished the techniques you may try them in standing with one foot resting on the tub, or in other positions.  This is a good stretch to do in the shower if you don't need to use lubricant.   Moisturizers . They are used in the vagina to hydrate the mucous membrane that make up the vaginal canal. . Designed to keep a more normal acid balance (ph) . Once placed in the vagina, it will last between two to three days.  . Use 2-3 times per week at bedtime and last longer than 60 min. . Ingredients to avoid is glycerin and fragrance, can increase chance of infection . Should not be used just before sex due to causing irritation . Most are gels administered either in a tampon-shaped applicator or as a vaginal suppository. They are non-hormonal.   Types of  Moisturizers . Leatrice Jewels- drug store . Vitamin E vaginal suppositories- Whole foods, Amazon . Moist Again . Coconut oil- can break down condoms . Julva- (Do no use if on Tamoxifen) amazon . Yes moisturizer- amazon . NeuEve Silk , NeuEve Silver for menopausal or over 65 (if have severe vaginal atrophy or cancer treatments use NeuEve Silk for  1 month than move to [employer])- [employer], ShapeConsultant.com.cy . Olive and Bee intimate cream- www.oliveandbee.com.au . Mae vaginal moisturizer- Amazon  Creams to use externally on the Vulva area  Marathon Oil (good for for cancer patients that had radiation to the area)- [place] or [employer].https://garcia-valdez.org/  V-magic cream - amazon  Julva-amazon  Vital "V Wild Yam salve ( help moisturize and help with thinning vulvar area, does have Beeswax  [employer] Pro-Meno Wild Yam Cream- Amazon  Desert Harvest Gele  Cleo by Damiva labial moisturizer (Amazon,    Things to avoid in the vaginal area . Do not use things to irritate the vulvar area . No lotions just specialized creams for the vulva area- Neogyn, V-magic, No soaps; can use Aveeno or Calendula cleanser if needed. Must be gentle . No deodorants . No douches . Good to sleep without underwear to let the vaginal area to air out . No scrubbing: spread the lips to let warm water rinse over labias and pat dry Access Code: [email or web address]  URL: [email or web address]  Date: 07/18/2018  Prepared by: Dorie Rank  Exercises  Supine Diaphragmatic Breathing - 10 reps - 1 sets - 3x daily - 7x weekly  Hooklying Single Knee to Chest - 5 reps - 1 sets - 10 sec hold - 1x daily - 7x weekly  Supine Lower Trunk Rotation - 10 reps - 3 sets - 1x daily - 7x weekly

## 2018-07-25 ENCOUNTER — Encounter: Payer: Medicaid Other | Admitting: Physical Therapy

## 2018-08-01 ENCOUNTER — Ambulatory Visit: Payer: Medicaid Other | Admitting: Physical Therapy

## 2018-08-07 ENCOUNTER — Ambulatory Visit: Payer: Medicaid Other | Admitting: Physical Therapy

## 2018-08-07 ENCOUNTER — Encounter: Payer: Self-pay | Admitting: Physical Therapy

## 2018-08-07 DIAGNOSIS — M6281 Muscle weakness (generalized): Secondary | ICD-10-CM | POA: Diagnosis not present

## 2018-08-07 DIAGNOSIS — G8929 Other chronic pain: Secondary | ICD-10-CM

## 2018-08-07 DIAGNOSIS — R252 Cramp and spasm: Secondary | ICD-10-CM

## 2018-08-07 DIAGNOSIS — M545 Low back pain: Secondary | ICD-10-CM

## 2018-08-07 NOTE — Therapy (Signed)
[facility] [address] [place], [place], [address] Phone: [phone]   Fax:  [phone]  Physical Therapy Treatment  Patient Details  Name: Chloe Rodgers MRN: [id number] Date of Birth: August 28, 1990 Referring Provider (PT): Emelia Loron, NP    Encounter Date: 08/07/2018  PT End of Session - 08/07/18 0850    Visit Number  3    Date for PT Re-Evaluation  08/23/18    Authorization Type  medicaid    Authorization - Visit Number  3    Authorization - Number of Visits  3    PT Start Time  0850    PT Stop Time  0930    PT Time Calculation (min)  40 min       Past Medical History:  Diagnosis Date  . Anemia   . Anxiety   . Chlamydia   . Complication of anesthesia    ??, seizure with wisdom teeth  . Depression    not on meds, sees a therapist  . Infection    UTI  . Kidney stone    kidney stones  . Migraine   . PID (acute pelvic inflammatory disease) 2014  . PONV (postoperative nausea and vomiting)   . Psoriasis   . Seizures [facility])    July 2013 - [place]    Past Surgical History:  Procedure Laterality Date  . CESAREAN SECTION    . CESAREAN SECTION  06/06/2012   Procedure: CESAREAN SECTION;  Surgeon: Woodroe Mode, MD;  Location: [facility];  Service: Obstetrics;  Laterality: N/A;  . CESAREAN SECTION N/A 03/30/2017   Procedure: REPEAT CESAREAN SECTION;  Surgeon: Florian Buff, MD;  Location: [place];  Service: Obstetrics;  Laterality: N/A;  . SKIN GRAFT     off abd, onto arm  . WISDOM TOOTH EXTRACTION      There were no vitals filed for this visit.  Subjective Assessment - 08/07/18 0853    Subjective  My pain is higher today. No idea why.    Currently in Pain?  Yes    Pain Score  8     Pain Location  Pelvis   and low back   Pain Orientation  Right    Pain Descriptors / Indicators  Sharp    Aggravating Factors   Getting up out of bed in AM, turning, having sex    Pain Relieving  Factors  Avoiding the above activities, meds, my exercises         [employer] PT Assessment - 08/07/18 0001      Strength   Right Hip Flexion  4-/5    Right Hip ABduction  4-/5    Left Hip Flexion  4/5    Left Hip ABduction  4/5                   [facility] Adult PT Treatment/Exercise - 08/07/18 0001      Knee/Hip Exercises: Stretches   Active Hamstring Stretch  Both;2 reps;30 seconds   with strap, VC to breathe, TC for upper traps   Active Hamstring Stretch Limitations  Bil adductor stretch with strap 2x 30 sec    Other Knee/Hip Stretches  Sigle leg clamshell for prep 20x bil, then hip ER release bilaterally 2-3 min holds on each side.   With MHP to low back            PT Education - 08/07/18 0939    Education Details  How to perfrom a  proper lower abdominal contraction    Person(s) Educated  Patient    Methods  Explanation;Demonstration;Verbal cues    Comprehension  Verbalized understanding       PT Short Term Goals - 08/07/18 0855      PT SHORT TERM GOAL #1   Title  ind with initial HEP    Time  4    Period  Weeks    Status  Achieved        PT Long Term Goals - 08/07/18 9518      PT LONG TERM GOAL #2   Title  Pt will report 50% less pain during daily activities so she can perform tasks such as meal prep without assistance    Baseline  8/10 pain    Time  12    Period  Weeks    Status  On-going   Pain unchanged at this time     PT LONG TERM GOAL #4   Title  pt will be able to perfrom functional transfers without increased pain due to increased core strength.    Time  12    Period  Weeks    Status  Partially Met   if I take my medicine it helps reduce the amt of pain when she transfers, otherwise it would increase her pain           Plan - 08/07/18 0922    Clinical Impression Statement  Pt reporting today her pain is still constant, worsens with transitional movements and especially is bad if she does not take her medicine. Review of goals  and MMT today reveals no change in RTLE ( limited by pain for sure) but a half grade improvement in her ability to resist on the LTLE, Added some more hip stretches to her HEP today. She wasn't thrilled with the stretches as they initially casued discomfort, but after discussing pain and what specifically she was feelign she was more open to doing them and even felt some better at the end of the session.PTA educated pt on how to perform a lower abdominal contraction prior to moving. Pt verbally understood the principles.     Rehab Potential  Excellent    Clinical Impairments Affecting Rehab Potential  scoliosis, chronic pain    PT Frequency  1x / week    PT Duration  12 weeks    PT Treatment/Interventions  ADLs/Self Care Home Management;Biofeedback;Cryotherapy;Software engineer;Therapeutic activities;Therapeutic exercise;Neuromuscular re-education;Patient/family education;Manual techniques;Dry needling;Scar mobilization;Taping;Passive range of motion    PT Next Visit Plan  internal STM, lumbar and hip stretches, meditation relax    PT Home Exercise Plan  Access Code: [email or web address]; Access Code: [email or web address]    Consulted and Agree with Plan of Care  Patient       Patient will benefit from skilled therapeutic intervention in order to improve the following deficits and impairments:  Pain, Increased fascial restricitons  Visit Diagnosis: Muscle weakness (generalized)  Cramp and spasm  Chronic low back pain without sciatica, unspecified back pain laterality     Problem List Patient Active Problem List   Diagnosis Date Noted  . Chronic PID (chronic pelvic inflammatory disease) 12/13/2017  . Pelvic inflammatory disease 09/29/2017  . Status post repeat low transverse cesarean section 03/30/2017  . Depression affecting pregnancy in second trimester, antepartum 01/05/2017  . Supervision of normal pregnancy 10/06/2016  . Chlamydia infection affecting pregnancy in first  trimester 08/23/2016  . Pelvic inflammatory disease (PID) 02/21/2013  . Migraines 07/24/2012  . Previous cesarean delivery,  antepartum condition or complication 92/42/6834  . Obese 03/30/2012  . Seizure disorder ([place]) 02/03/2012  . Family history of breast cancer in mother 02/03/2012     Myrene Galas, PTA 08/07/18 9:40 AM  [facility] [address] [place], [place], [address] Phone: [phone]   Fax:  [phone]  Name: Chloe Rodgers MRN: [id number] Date of Birth: 21-May-1991  Access Code: [email or web address]  URL: [email or web address]  Date: 08/07/2018  Prepared by: Myrene Galas   Exercises  Supine Diaphragmatic Breathing - 10 reps - 1 sets - 3x daily - 7x weekly  Hooklying Single Knee to Chest - 5 reps - 1 sets - 10 sec hold - 1x daily - 7x weekly  Supine Lower Trunk Rotation - 10 reps - 3 sets - 1x daily - 7x weekly  Supine Hamstring Stretch with Strap - 2 reps - 1 sets - 20 hold - 2x daily - 7x weekly  Supine Hip Adductor Stretch - 10 reps - 1 sets - 3x daily - 7x weekly

## 2018-08-16 ENCOUNTER — Ambulatory Visit: Payer: Medicaid Other | Attending: Nurse Practitioner | Admitting: Physical Therapy

## 2018-08-16 DIAGNOSIS — R252 Cramp and spasm: Secondary | ICD-10-CM | POA: Insufficient documentation

## 2018-08-16 DIAGNOSIS — M6281 Muscle weakness (generalized): Secondary | ICD-10-CM | POA: Insufficient documentation

## 2018-08-16 DIAGNOSIS — M545 Low back pain, unspecified: Secondary | ICD-10-CM

## 2018-08-16 DIAGNOSIS — G8929 Other chronic pain: Secondary | ICD-10-CM | POA: Insufficient documentation

## 2018-08-16 NOTE — Therapy (Signed)
[facility] [address] [place], [place], [address] Phone: [phone]   Fax:  [phone]  Physical Therapy Treatment  Patient Details  Name: Chloe Rodgers MRN: [id number] Date of Birth: 02-17-1991 Referring Provider (PT): Emelia Loron, NP    Encounter Date: 08/16/2018  PT End of Session - 08/16/18 1017    Visit Number  4    Date for PT Re-Evaluation  08/23/18    Authorization Type  medicaid 10/08/18    Authorization - Visit Number  1    Authorization - Number of Visits  8    PT Start Time  0931    PT Stop Time  0447    PT Time Calculation (min)  43 min    Activity Tolerance  Patient limited by pain;Patient tolerated treatment well    Behavior During Therapy  [facility] for tasks assessed/performed       Past Medical History:  Diagnosis Date  . Anemia   . Anxiety   . Chlamydia   . Complication of anesthesia    ??, seizure with wisdom teeth  . Depression    not on meds, sees a therapist  . Infection    UTI  . Kidney stone    kidney stones  . Migraine   . PID (acute pelvic inflammatory disease) 2014  . PONV (postoperative nausea and vomiting)   . Psoriasis   . Seizures [facility])    July 2013 - [place]    Past Surgical History:  Procedure Laterality Date  . CESAREAN SECTION    . CESAREAN SECTION  06/06/2012   Procedure: CESAREAN SECTION;  Surgeon: Woodroe Mode, MD;  Location: [place] [facility];  Service: Obstetrics;  Laterality: N/A;  . CESAREAN SECTION N/A 03/30/2017   Procedure: REPEAT CESAREAN SECTION;  Surgeon: Florian Buff, MD;  Location: [place];  Service: Obstetrics;  Laterality: N/A;  . SKIN GRAFT     off abd, onto arm  . WISDOM TOOTH EXTRACTION      There were no vitals filed for this visit.  Subjective Assessment - 08/16/18 0933    Subjective  Pt states it is better than other days today. Medicine helps and doing the stretches helps a little.  Pt reports feeling constipated and blood in  stool with a lot of pain.    Patient Stated Goals  being able to do more things and have more energy    Currently in Pain?  Yes    Pain Score  7     Pain Location  Perineum    Pain Descriptors / Indicators  Pressure    Pain Onset  More than a month ago                       [facility] Adult PT Treatment/Exercise - 08/16/18 0001      Self-Care   Other Self-Care Comments   fiber, water, abdomial massage      Lumbar Exercises: Stretches   Press Ups  10 reps;5 seconds      Lumbar Exercises: Supine   Heel Slides  5 reps   with TrA     Lumbar Exercises: Prone   Other Prone Lumbar Exercises  TrA conraction prone      Manual Therapy   Manual Therapy  Myofascial release    Soft tissue mobilization  lumbar paraspinals and glutes    Myofascial Release  abdominal fascial release, lumbar fascial release with cup  PT Education - 08/16/18 1059    Education Details  fiber, abdominal massage, increase water intake    Person(s) Educated  Patient    Methods  Explanation;Handout;Demonstration    Comprehension  Verbalized understanding;Returned demonstration       PT Short Term Goals - 08/07/18 0855      PT SHORT TERM GOAL #1   Title  ind with initial HEP    Time  4    Period  Weeks    Status  Achieved        PT Long Term Goals - 08/07/18 0350      PT LONG TERM GOAL #2   Title  Pt will report 50% less pain during daily activities so she can perform tasks such as meal prep without assistance    Baseline  8/10 pain    Time  12    Period  Weeks    Status  On-going   Pain unchanged at this time     PT LONG TERM GOAL #4   Title  pt will be able to perfrom functional transfers without increased pain due to increased core strength.    Time  12    Period  Weeks    Status  Partially Met   if I take my medicine it helps reduce the amt of pain when she transfers, otherwise it would increase her pain           Plan - 08/16/18 1018    Clinical  Impression Statement  Pt has been feeling a little better with treatment but still reports a lot of pain when getting out of bed and states some days there is a lot of pain.  Pt states she still has to bear down a lot when having a BM and doesn't have BM for many days at a time.  states she is getting a lot of blood in stool sometimes.  Pt was given and understands information about drinking more water, fiber intake and abdominal massage.  She was also recommended to see PCP for referral as needed due to blood in stool.  Pt had tenderness to abdomen and some increased tension with prone press up. Pt will benefit from skilled PT to address mobilty and pain management for ability to be caretaker for her children.    Clinical Impairments Affecting Rehab Potential  scoliosis, chronic pain    PT Treatment/Interventions  ADLs/Self Care Home Management;Biofeedback;Cryotherapy;Software engineer;Therapeutic activities;Therapeutic exercise;Neuromuscular re-education;Patient/family education;Manual techniques;Dry needling;Scar mobilization;Taping;Passive range of motion    PT Next Visit Plan  internal STM, lumbar and hip stretches, meditation relax    PT Home Exercise Plan  Access Code: [email or web address]; Access Code: [email or web address]    Consulted and Agree with Plan of Care  Patient       Patient will benefit from skilled therapeutic intervention in order to improve the following deficits and impairments:  Pain, Increased fascial restricitons  Visit Diagnosis: Muscle weakness (generalized)  Cramp and spasm  Chronic low back pain without sciatica, unspecified back pain laterality     Problem List Patient Active Problem List   Diagnosis Date Noted  . Chronic PID (chronic pelvic inflammatory disease) 12/13/2017  . Pelvic inflammatory disease 09/29/2017  . Status post repeat low transverse cesarean section 03/30/2017  . Depression affecting pregnancy in second trimester, antepartum  01/05/2017  . Supervision of normal pregnancy 10/06/2016  . Chlamydia infection affecting pregnancy in first trimester 08/23/2016  . Pelvic inflammatory disease (PID) 02/21/2013  . Migraines 07/24/2012  .  Previous cesarean delivery, antepartum condition or complication 11/10/5613  . Obese 03/30/2012  . Seizure disorder ([place]) 02/03/2012  . Family history of breast cancer in mother 02/03/2012    Zannie Cove, PT 08/16/2018, 10:59 AM  [facility] [address], [place], [place], [address] Phone: [phone]   Fax:  [phone]  Name: Chloe Rodgers MRN: [id number] Date of Birth: 10-19-1990

## 2018-08-16 NOTE — Patient Instructions (Signed)
Types of Fiber  There are two main types of fiber:  insoluble and soluble.  Both of these types can prevent and relieve constipation and diarrhea, although some people find one or the other to be more easily digested.  This handout details information about both types of fiber.  Insoluble Fiber       Functions of Insoluble Fiber . moves bulk through the intestines  . controls and balances the pH (acidity) in the intestines       Benefits of Insoluble Fiber . promotes regular bowel movement and prevents constipation  . removes fecal waste through colon in less time  . keeps an optimal pH in intestines to prevent microbes from producing cancer substances, therefore preventing colon cancer        Food Sources of Insoluble Fiber . whole-wheat products  . wheat bran "miller's bran" . corn bran  . flax seed or other seeds . vegetables such as green beans, broccoli, cauliflower and potato skins  . fruit skins and root vegetable skins  . popcorn . brown rice  Soluble Fiber       Functions of Soluble Fiber  . holds water in the colon to bulk and soften the stool . prolongs stomach emptying time so that sugar is released and absorbed more slowly        Benefits of Soluble Fiber . lowers total cholesterol and LDL cholesterol (the bad cholesterol) therefore reducing the risk of heart disease  . regulates blood sugar for people with diabetes       Food Sources of Soluble Fiber . oat/oat bran . dried beans and peas  . nuts  . barley  . flax seed or other seeds . fruits such as oranges, pears, peaches, and apples  . vegetables such as carrots  . psyllium husk  . prunes  * Drink at least half of body weight in oz  About Abdominal Massage  Abdominal massage, also called external colon massage, is a self-treatment circular massage technique that can reduce and eliminate gas and ease constipation. The colon naturally contracts in waves in a clockwise direction starting from inside the  right hip, moving up toward the ribs, across the belly, and down inside the left hip.  When you perform circular abdominal massage, you help stimulate your colon's normal wave pattern of movement called peristalsis.  It is most beneficial when done after eating.  Positioning You can practice abdominal massage with oil while lying down, or in the shower with soap.  Some people find that it is just as effective to do the massage through clothing while sitting or standing.  How to Massage Start by placing your finger tips or knuckles on your right side, just inside your hip bone.  . Make small circular movements while you move upward toward your rib cage.   . Once you reach the bottom right side of your rib cage, take your circular movements across to the left side of the bottom of your rib cage.  . Next, move downward until you reach the inside of your left hip bone.  This is the path your feces travel in your colon. . Continue to perform your abdominal massage in this pattern for 10 minutes each day.     You can apply as much pressure as is comfortable in your massage.  Start gently and build pressure as you continue to practice.  Notice any areas of pain as you massage; areas of slight pain may be relieved as you  massage, but if you have areas of significant or intense pain, consult with your healthcare provider.  Other Considerations . General physical activity including bending and stretching can have a beneficial massage-like effect on the colon.  Deep breathing can also stimulate the colon because breathing deeply activates the same nervous system that supplies the colon.   . Abdominal massage should always be used in combination with a bowel-conscious diet that is high in the proper type of fiber for you, fluids (primarily water), and a regular exercise program.

## 2018-08-17 ENCOUNTER — Ambulatory Visit: Payer: Medicaid Other | Admitting: Obstetrics & Gynecology

## 2018-08-23 ENCOUNTER — Encounter: Payer: Medicaid Other | Admitting: Physical Therapy

## 2018-08-30 ENCOUNTER — Ambulatory Visit: Payer: Medicaid Other | Admitting: Physical Therapy

## 2018-09-02 ENCOUNTER — Other Ambulatory Visit: Payer: Self-pay | Admitting: Advanced Practice Midwife

## 2018-09-02 ENCOUNTER — Encounter (HOSPITAL_COMMUNITY): Payer: Self-pay

## 2018-09-02 ENCOUNTER — Inpatient Hospital Stay (HOSPITAL_COMMUNITY)
Admission: AD | Admit: 2018-09-02 | Discharge: 2018-09-02 | Disposition: A | Discharge status: Home / Self Care | Payer: Medicaid Other | Source: Ambulatory Visit | Attending: Obstetrics & Gynecology | Admitting: Obstetrics & Gynecology

## 2018-09-02 ENCOUNTER — Other Ambulatory Visit: Payer: Self-pay

## 2018-09-02 DIAGNOSIS — B9689 Other specified bacterial agents as the cause of diseases classified elsewhere: Secondary | ICD-10-CM | POA: Insufficient documentation

## 2018-09-02 DIAGNOSIS — N760 Acute vaginitis: Secondary | ICD-10-CM | POA: Diagnosis not present

## 2018-09-02 DIAGNOSIS — N939 Abnormal uterine and vaginal bleeding, unspecified: Secondary | ICD-10-CM | POA: Diagnosis not present

## 2018-09-02 DIAGNOSIS — G8929 Other chronic pain: Secondary | ICD-10-CM | POA: Insufficient documentation

## 2018-09-02 DIAGNOSIS — R102 Pelvic and perineal pain: Secondary | ICD-10-CM | POA: Diagnosis not present

## 2018-09-02 LAB — URINALYSIS, MICROSCOPIC (REFLEX)

## 2018-09-02 LAB — URINALYSIS, ROUTINE W REFLEX MICROSCOPIC
Bilirubin Urine: NEGATIVE
Glucose, UA: NEGATIVE mg/dL
Ketones, ur: NEGATIVE mg/dL
Leukocytes,Ua: NEGATIVE
Nitrite: NEGATIVE
Protein, ur: NEGATIVE mg/dL
Specific Gravity, Urine: 1.02 (ref 1.005–1.030)
pH: 8.5 — ABNORMAL HIGH (ref 5.0–8.0)

## 2018-09-02 LAB — CBC
HCT: 35.7 % — ABNORMAL LOW (ref 36.0–46.0)
Hemoglobin: 11.5 g/dL — ABNORMAL LOW (ref 12.0–15.0)
MCH: 26.8 pg (ref 26.0–34.0)
MCHC: 32.2 g/dL (ref 30.0–36.0)
MCV: 83.2 fL (ref 80.0–100.0)
Platelets: 177 10*3/uL (ref 150–400)
RBC: 4.29 MIL/uL (ref 3.87–5.11)
RDW: 13.4 % (ref 11.5–15.5)
WBC: 5.4 10*3/uL (ref 4.0–10.5)
nRBC: 0 % (ref 0.0–0.2)

## 2018-09-02 LAB — WET PREP, GENITAL
Sperm: NONE SEEN
Trich, Wet Prep: NONE SEEN
Yeast Wet Prep HPF POC: NONE SEEN

## 2018-09-02 LAB — POCT PREGNANCY, URINE: Preg Test, Ur: NEGATIVE

## 2018-09-02 LAB — HCG, QUANTITATIVE, PREGNANCY: hCG, Beta Chain, Quant, S: <1 m[IU]/mL (ref <5)

## 2018-09-02 MED ORDER — METRONIDAZOLE 500 MG PO TABS: 500.0000 mg | ORAL_TABLET | Freq: Two times a day (BID) | ORAL | 0 refills | Status: AC

## 2018-09-02 MED ORDER — METRONIDAZOLE 0.75 % VA GEL: VAGINAL | 0 refills | Status: DC

## 2018-09-02 NOTE — MAU Note (Signed)
Chloe Rodgers is a 28 y.o. here in MAU reporting: reports that she had a negative UPT at home but thinks she is pregnant. Been having cramps and spotting.   LMP: 08/12/18 approximate  Onset of complaint: 08/28/18  Pain score: 4/10  Vitals:   09/02/18 0939  BP: 128/71  Pulse: 88  Resp: 18  Temp: 98.2 F (36.8 C)  SpO2: 99%      Lab orders placed from triage: UA, UPT

## 2018-09-02 NOTE — MAU Provider Note (Signed)
Chief Complaint: Abdominal Pain and Vaginal Bleeding   First Provider Initiated Contact with Patient 09/02/18 1031      SUBJECTIVE HPI: Chloe Rodgers is a 28 y.o. [id number] who presents to maternity admissions reporting cramping and irregular bleeding this month with suspected pregnancy. She reports her periods are usually regular but this month she had her usual period early in the month but then onset of spotting 1 week ago which was unusual. With the onset of spotting came abdominal cramping and pain.  There is also nausea and headache.  She took pregnancy tests at home which were negative but this feels like pregnancy symptoms per the pt so she wants to be evaluated.  There are no other associated symptoms. She is treated at [facility] for pain management for chronic pelvic pain. She has taking her daily medication, but no additional treatments for this pain. The pain is intermittent sharp pain in her low abdomen, different from her usual chronic pelvic pain. The bleeding lasted 3-4 days and resolved.   HPI  Past Medical History:  Diagnosis Date  . Anemia   . Anxiety   . Chlamydia   . Complication of anesthesia    ??, seizure with wisdom teeth  . Depression    not on meds, sees a therapist  . Infection    UTI  . Kidney stone    kidney stones  . Migraine   . PID (acute pelvic inflammatory disease) 2014  . PONV (postoperative nausea and vomiting)   . Psoriasis   . Seizures [facility])    July 2013 - [place]   Past Surgical History:  Procedure Laterality Date  . CESAREAN SECTION    . CESAREAN SECTION  06/06/2012   Procedure: CESAREAN SECTION;  Surgeon: Adam Phenix, MD;  Location: [facility];  Service: Obstetrics;  Laterality: N/A;  . CESAREAN SECTION N/A 03/30/2017   Procedure: REPEAT CESAREAN SECTION;  Surgeon: Lazaro Arms, MD;  Location: [facility] BIRTHING SUITES;  Service: Obstetrics;  Laterality: N/A;  . SKIN GRAFT     off abd, onto arm  . WISDOM TOOTH EXTRACTION      Social History   Socioeconomic History  . Marital status: Single    Spouse name: Not on file  . Number of children: Not on file  . Years of education: Not on file  . Highest education level: Not on file  Occupational History  . Not on file  Social Needs  . Financial resource strain: Not on file  . Food insecurity:    Worry: Not on file    Inability: Not on file  . Transportation needs:    Medical: Not on file    Non-medical: Not on file  Tobacco Use  . Smoking status: Never Smoker  . Smokeless tobacco: Never Used  Substance and Sexual Activity  . Alcohol use: No    Comment: socially but none with pregnancy  . Drug use: No  . Sexual activity: Not Currently    Birth control/protection: Injection    Comment: last march 2019  Lifestyle  . Physical activity:    Days per week: Not on file    Minutes per session: Not on file  . Stress: Not on file  Relationships  . Social connections:    Talks on phone: Not on file    Gets together: Not on file    Attends religious service: Not on file    Active member of club or organization: Not on file    Attends  meetings of clubs or organizations: Not on file    Relationship status: Not on file  . Intimate partner violence:    Fear of current or ex partner: Not on file    Emotionally abused: Not on file    Physically abused: Not on file    Forced sexual activity: Not on file  Other Topics Concern  . Not on file  Social History Narrative  . Not on file   No current facility-administered medications on file prior to encounter.    Current Outpatient Medications on File Prior to Encounter  Medication Sig Dispense Refill  . oxyCODONE HCl (ROXYBOND PO) Take 15 mg/day by mouth 1 day or 1 dose.    . clindamycin (CLEOCIN) 300 MG capsule Take 1 capsule (300 mg total) by mouth 2 (two) times daily. 28 capsule 0  . doxycycline (VIBRAMYCIN) 100 MG capsule Take 1 capsule (100 mg total) by mouth 2 (two) times daily. 28 capsule 0  .  HYDROcodone-acetaminophen (NORCO/VICODIN) 5-325 MG tablet Take 1 tablet by mouth every 4 (four) hours as needed. 10 tablet 0  . ondansetron (ZOFRAN) 4 MG tablet Take 1 tablet (4 mg total) by mouth every 6 (six) hours. 12 tablet 0   Allergies  Allergen Reactions  . Naproxen Itching  . Toradol [Ketorolac Tromethamine] Itching    ROS:  Review of Systems  Constitutional: Negative for chills, fatigue and fever.  Respiratory: Negative for shortness of breath.   Cardiovascular: Negative for chest pain.  Genitourinary: Positive for pelvic pain. Negative for difficulty urinating, dysuria, flank pain, vaginal bleeding, vaginal discharge and vaginal pain.  Neurological: Negative for dizziness and headaches.  Psychiatric/Behavioral: Negative.      I have reviewed patient's Past Medical Hx, Surgical Hx, Family Hx, Social Hx, medications and allergies.   Physical Exam   Patient Vitals for the past 24 hrs:  BP Temp Temp src Pulse Resp SpO2 Height Weight  09/02/18 0939 128/71 98.2 F (36.8 C) Oral 88 18 99 % - -  09/02/18 0935 - - - - - -  (1.676 m) 108.7 kg   Constitutional: Well-developed, well-nourished female in no acute distress.  Cardiovascular: normal rate Respiratory: normal effort GI: Abd soft, non-tender. Pos BS x 4 MS: Extremities nontender, no edema, normal ROM Neurologic: Alert and oriented x 4.  GU: Neg CVAT.  PELVIC EXAM: Cervix pink, visually closed, without lesion, scant white creamy discharge, vaginal walls and external genitalia normal Bimanual exam: Cervix 0/long/high, firm, anterior, neg CMT, uterus nontender, nonenlarged, adnexa without tenderness, enlargement, or mass   LAB RESULTS Results for orders placed or performed during the hospital encounter of 09/02/18 (from the past 24 hour(s))  Pregnancy, urine POC     Status: None   Collection Time: 09/02/18 10:00 AM  Result Value Ref Range   Preg Test, Ur NEGATIVE NEGATIVE       IMAGING No results  found.  MAU Management/MDM: Orders Placed This Encounter  Procedures  . Wet prep, genital  . Urinalysis, Routine w reflex microscopic  . hCG, quantitative, pregnancy  . CBC  . Pregnancy, urine POC    No orders of the defined types were placed in this encounter.   Pregnancy test negative.  Pelvic exam benign. No acute abdomen, no evidence of emergent condition.  Pt to f/u in office for Gyn care.  Will treat today for BV since there are clue cells in wet prep to see if symptoms improve. Continue pain management regimen as prescribed.  Pt discharged  with strict return precautions.  ASSESSMENT  1. Bacterial vaginosis   2. Abnormal uterine bleeding (AUB)   3. Chronic pelvic pain syndrome in female    PLAN Discharge home   Sharen Counter Certified Nurse-Midwife 09/02/2018  10:32 AM

## 2018-09-02 NOTE — Progress Notes (Signed)
Metrogel sent to pharmacy by pt request. Rx for Flagyl sent earlier during MAU visit.

## 2018-09-04 LAB — GC/CHLAMYDIA PROBE AMP (~~LOC~~) NOT AT ARMC
Chlamydia: NEGATIVE
Neisseria Gonorrhea: NEGATIVE

## 2018-09-06 ENCOUNTER — Ambulatory Visit: Payer: Medicaid Other | Admitting: Physical Therapy

## 2018-09-06 ENCOUNTER — Telehealth: Payer: Self-pay | Admitting: Physical Therapy
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
## 2018-09-13 ENCOUNTER — Ambulatory Visit: Payer: Medicaid Other | Attending: Nurse Practitioner | Admitting: Physical Therapy

## 2018-09-13 DIAGNOSIS — G8929 Other chronic pain: Secondary | ICD-10-CM | POA: Diagnosis present

## 2018-09-13 DIAGNOSIS — M545 Low back pain: Secondary | ICD-10-CM | POA: Insufficient documentation

## 2018-09-13 DIAGNOSIS — M6281 Muscle weakness (generalized): Secondary | ICD-10-CM | POA: Insufficient documentation

## 2018-09-13 DIAGNOSIS — R252 Cramp and spasm: Secondary | ICD-10-CM | POA: Diagnosis present

## 2018-09-13 NOTE — Therapy (Signed)
[facility] [address] [place], [place], [address] Phone: [phone]   Fax:  [phone]  Physical Therapy Treatment  Patient Details  Name: Chloe Rodgers MRN: [id number] Date of Birth: 02-15-1991 Referring Provider (PT): Carmel Sacramento, NP    Encounter Date: 09/13/2018  PT End of Session - 09/13/18 1050    Visit Number  5    Date for PT Re-Evaluation  11/08/18    Authorization Type  medicaid 10/08/18    PT Start Time  0947    PT Stop Time  1015    PT Time Calculation (min)  28 min    Activity Tolerance  Patient tolerated treatment well    Behavior During Therapy  [facility] for tasks assessed/performed       Past Medical History:  Diagnosis Date  . Anemia   . Anxiety   . Chlamydia   . Complication of anesthesia    ??, seizure with wisdom teeth  . Depression    not on meds, sees a therapist  . Infection    UTI  . Kidney stone    kidney stones  . Migraine   . PID (acute pelvic inflammatory disease) 2014  . PONV (postoperative nausea and vomiting)   . Psoriasis   . Seizures [facility])    July 2013 - [place]    Past Surgical History:  Procedure Laterality Date  . CESAREAN SECTION    . CESAREAN SECTION  06/06/2012   Procedure: CESAREAN SECTION;  Surgeon: Adam Phenix, MD;  Location: [facility];  Service: Obstetrics;  Laterality: N/A;  . CESAREAN SECTION N/A 03/30/2017   Procedure: REPEAT CESAREAN SECTION;  Surgeon: Lazaro Arms, MD;  Location: [facility] BIRTHING SUITES;  Service: Obstetrics;  Laterality: N/A;  . SKIN GRAFT     off abd, onto arm  . WISDOM TOOTH EXTRACTION      There were no vitals filed for this visit.  Subjective Assessment - 09/13/18 0949    Subjective  Pt states she is having 9/10 pain and a lot of spotting.      Patient Stated Goals  being able to do more things and have more energy    Currently in Pain?  Yes    Pain Score  9     Pain Location  Abdomen    Pain Orientation  Lower    Pain  Descriptors / Indicators  Pins and needles    Pain Type  Chronic pain    Pain Onset  More than a month ago    Pain Frequency  Intermittent    Multiple Pain Sites  No                       [facility] Adult PT Treatment/Exercise - 09/13/18 0001      Lumbar Exercises: Stretches   Other Lumbar Stretch Exercise  seated lumbar flexion with blue ball - educated on lumbar flexion in sitting to do at home - 5/10sec      Manual Therapy   Soft tissue mobilization  lumbar paraspinals and gluteal attachments       Trigger Point Dry Needling - 09/13/18 0001    Consent Given?  Yes    Education Handout Provided  --    verbally reviewed   Muscles Treated Back/Hip  Lumbar multifidi;Erector spinae   bilat   Erector spinae Response  Twitch response elicited;Palpable increased muscle length   lumbar   Lumbar multifidi Response  Twitch response elicited  PT Short Term Goals - 09/13/18 1511      PT SHORT TERM GOAL #1   Title  ind with initial HEP    Status  Achieved        PT Long Term Goals - 09/13/18 1511      PT LONG TERM GOAL #1   Title  pt will be ind with advanced HEP for pain management    Time  8    Period  Weeks    Status  On-going    Target Date  11/08/18      PT LONG TERM GOAL #2   Title  Pt will report 50% less pain during daily activities so she can perform tasks such as meal prep without assistance    Baseline  unable to notice any change    Time  8    Period  Weeks    Status  On-going    Target Date  11/08/18      PT LONG TERM GOAL #3   Title  Pt will demonstrate 4+/5 MMT on bilateral hip flexion and abduction for improved upright posture during functional activities.    Baseline  4-/5    Time  8    Period  Weeks    Status  On-going    Target Date  11/08/18      PT LONG TERM GOAL #4   Title  pt will be able to perfrom functional transfers without increased pain due to increased core strength.    Baseline  transfers today without  increased pain    Time  8    Period  Weeks    Status  Achieved    Target Date  11/08/18            Plan - 09/13/18 1513    Clinical Impression Statement  Pt seems to be confused about whether PT is helping, but she cancelled and no showed the last 3 appointments.  Pt was educated on coming more consistently in order to gain some momentum on the tension she is having because she reports she feels better after coming here.  She also reports a lot of inconsistency with her HEP.  Pt will benefit from skilled PT to address her goals and impairments.  Pt now seems more ready to be consistent with her exercises and appointments.    PT Frequency  1x / week    PT Duration  8 weeks    PT Treatment/Interventions  ADLs/Self Care Home Management;Biofeedback;Cryotherapy;Academic librarian;Therapeutic activities;Therapeutic exercise;Neuromuscular re-education;Patient/family education;Manual techniques;Dry needling;Scar mobilization;Taping;Passive range of motion    PT Next Visit Plan  internal STM, lumbar and hip stretches, meditation relax, core strength    PT Home Exercise Plan  Access Code: [email or web address]; Access Code: [email or web address]    Consulted and Agree with Plan of Care  Patient       Patient will benefit from skilled therapeutic intervention in order to improve the following deficits and impairments:  Pain, Increased fascial restricitons, Decreased strength, Decreased coordination, Postural dysfunction  Visit Diagnosis: Muscle weakness (generalized)  Cramp and spasm  Chronic low back pain without sciatica, unspecified back pain laterality     Problem List Patient Active Problem List   Diagnosis Date Noted  . Chronic PID (chronic pelvic inflammatory disease) 12/13/2017  . Status post repeat low transverse cesarean section 03/30/2017  . Pelvic inflammatory disease (PID) 02/21/2013  . Migraines 07/24/2012  . Previous cesarean delivery, antepartum condition or  complication 03/30/2012  . Obese 03/30/2012  .  Seizure disorder ([facility]) 02/03/2012  . Family history of breast cancer in mother 02/03/2012    Junious Silk, PT 09/13/2018, 3:24 PM  [facility] [address] [place], [place], [address] Phone: [phone]   Fax:  [phone]  Name: Rickie Gutierres MRN: [id number] Date of Birth: [birth year]

## 2018-09-20 ENCOUNTER — Encounter: Payer: Medicaid Other | Admitting: Physical Therapy

## 2018-09-27 ENCOUNTER — Other Ambulatory Visit: Payer: Self-pay

## 2018-09-27 ENCOUNTER — Encounter: Payer: Self-pay | Admitting: Physical Therapy

## 2018-09-27 ENCOUNTER — Ambulatory Visit: Payer: Medicaid Other | Admitting: Physical Therapy

## 2018-09-27 DIAGNOSIS — M6281 Muscle weakness (generalized): Secondary | ICD-10-CM | POA: Diagnosis not present

## 2018-09-27 DIAGNOSIS — R252 Cramp and spasm: Secondary | ICD-10-CM

## 2018-09-27 DIAGNOSIS — M545 Low back pain: Secondary | ICD-10-CM

## 2018-09-27 DIAGNOSIS — G8929 Other chronic pain: Secondary | ICD-10-CM

## 2018-09-27 NOTE — Therapy (Signed)
[facility] [address], [place], [place], [address] Phone: [phone]   Fax:  [phone]  Physical Therapy Treatment  Patient Details  Name: Chloe Rodgers MRN: [id number] Date of Birth: 09-24-1990 Referring Provider (PT): Emelia Loron, NP    Encounter Date: 09/27/2018  PT End of Session - 09/27/18 1643    Visit Number  6    Date for PT Re-Evaluation  11/08/18    Authorization Type  medicaid 10/08/18    PT Start Time  1447    PT Stop Time  1527    PT Time Calculation (min)  40 min    Activity Tolerance  Patient tolerated treatment well    Behavior During Therapy  [facility] for tasks assessed/performed       Past Medical History:  Diagnosis Date  . Anemia   . Anxiety   . Chlamydia   . Complication of anesthesia    ??, seizure with wisdom teeth  . Depression    not on meds, sees a therapist  . Infection    UTI  . Kidney stone    kidney stones  . Migraine   . PID (acute pelvic inflammatory disease) 2014  . PONV (postoperative nausea and vomiting)   . Psoriasis   . Seizures [facility])    July 2013 - [place]    Past Surgical History:  Procedure Laterality Date  . CESAREAN SECTION    . CESAREAN SECTION  06/06/2012   Procedure: CESAREAN SECTION;  Surgeon: Woodroe Mode, MD;  Location: [place] [facility]S;  Service: Obstetrics;  Laterality: N/A;  . CESAREAN SECTION N/A 03/30/2017   Procedure: REPEAT CESAREAN SECTION;  Surgeon: Florian Buff, MD;  Location: [place];  Service: Obstetrics;  Laterality: N/A;  . SKIN GRAFT     off abd, onto arm  . WISDOM TOOTH EXTRACTION      There were no vitals filed for this visit.  Subjective Assessment - 09/27/18 1450    Subjective  Pt states she is having more pain in the back.  She states she has been laying down all day.  "Today has been the first bad day out the week"  I can't get out of the bathtub like a normal person.    Patient Stated Goals  being able to do more  things and have more energy    Currently in Pain?  Yes    Pain Score  8     Pain Location  Back    Pain Orientation  Lower    Pain Descriptors / Indicators  Aching    Pain Radiating Towards  around to abdomen and vagina    Pain Onset  More than a month ago    Pain Frequency  Intermittent    Multiple Pain Sites  No                       [facility] Adult PT Treatment/Exercise - 09/27/18 0001      Self-Care   Other Self-Care Comments   education on how to use SI belt and where to get - has pain relief with belt on      Exercises   Exercises  Lumbar      Lumbar Exercises: Aerobic   Nustep  L1 x 6 min   PT present to get status update     Lumbar Exercises: Standing   Forward Lunge  10 reps      Lumbar Exercises: Supine  Other Supine Lumbar Exercises  ball roll outs and lower trunk rotation with ball - 10x 5 sec      Lumbar Exercises: Quadruped   Other Quadruped Lumbar Exercises  forward and back rocking with core and pelvic floor bracing    Other Quadruped Lumbar Exercises  child pose stretch             PT Education - 09/27/18 1535    Education Details  info about SI belt    Person(s) Educated  Patient    Methods  Explanation    Comprehension  Verbalized understanding       PT Short Term Goals - 09/13/18 1511      PT SHORT TERM GOAL #1   Title  ind with initial HEP    Status  Achieved        PT Long Term Goals - 09/27/18 1500      PT LONG TERM GOAL #1   Title  pt will be ind with advanced HEP for pain management    Status  On-going      PT LONG TERM GOAL #2   Title  Pt will report 50% less pain during daily activities so she can perform tasks such as meal prep without assistance    Baseline  I can exercise now and it feels a little better    Status  Partially Met            Plan - 09/27/18 1533    Clinical Impression Statement  Pt had pain relief with SI belt.  She has significant tenderness to palpation at SI joint.  Pt did well  with exercises today and did not report any increased pain.  Pt was able to do some strengthening in quadruped and lunges for funcitona movements.  Pt will continue to benefit from skilled PT to progress core strength.    PT Treatment/Interventions  ADLs/Self Care Home Management;Biofeedback;Cryotherapy;Software engineer;Therapeutic activities;Therapeutic exercise;Neuromuscular re-education;Patient/family education;Manual techniques;Dry needling;Scar mobilization;Taping;Passive range of motion    PT Next Visit Plan  core strength and f/u on SI belt    PT Home Exercise Plan  Access Code: [email or web address]; Access Code: [email or web address]    Consulted and Agree with Plan of Care  Patient       Patient will benefit from skilled therapeutic intervention in order to improve the following deficits and impairments:  Pain, Increased fascial restricitons, Decreased strength, Decreased coordination, Postural dysfunction  Visit Diagnosis: No diagnosis found.     Problem List Patient Active Problem List   Diagnosis Date Noted  . Chronic PID (chronic pelvic inflammatory disease) 12/13/2017  . Status post repeat low transverse cesarean section 03/30/2017  . Pelvic inflammatory disease (PID) 02/21/2013  . Migraines 07/24/2012  . Previous cesarean delivery, antepartum condition or complication 28/00/3491  . Obese 03/30/2012  . Seizure disorder ([place]) 02/03/2012  . Family history of breast cancer in mother 02/03/2012    Bernita Buffy 09/27/2018, 5:30 PM  [facility] [address] [place], [place], [address] Phone: [phone]   Fax:  [phone]  Name: Chloe Rodgers MRN: [id number] Date of Birth: August 21, 1990

## 2018-09-27 NOTE — Patient Instructions (Signed)
Sacroiliac belts:  SI Loc [facility] [address], [address] [place], [place] [address] Phone # [phone] Fax [phone]

## 2018-10-02 ENCOUNTER — Ambulatory Visit: Payer: Medicaid Other | Admitting: Physical Therapy

## 2018-10-04 ENCOUNTER — Ambulatory Visit: Payer: Medicaid Other | Admitting: Physical Therapy

## 2018-10-20 ENCOUNTER — Telehealth: Payer: Self-pay | Admitting: Physical Therapy

## 2018-10-20 NOTE — Telephone Encounter (Signed)
Patient was called to discuss interest in telehealth for continuing her POC at this time due to adhering to COVID 19 precautions.   Russella Dar, PT 10/20/18 1:45 PM

## 2018-10-27 ENCOUNTER — Telehealth: Payer: Self-pay | Admitting: Physical Therapy

## 2018-10-27 NOTE — Telephone Encounter (Signed)
Patient was contacted today regarding transition if in-person OP Rehab Services to telehealth due to Covid-19. Pt consented to telehealth services, educated on MyChart signup, Webex App download, and was agreeable to receive information via (text/email) regarding telehealth services. Pt consented and was scheduled for appointment.  °

## 2018-11-02 ENCOUNTER — Encounter: Payer: Self-pay | Admitting: Physical Therapy

## 2018-11-02 ENCOUNTER — Other Ambulatory Visit: Payer: Self-pay

## 2018-11-02 ENCOUNTER — Ambulatory Visit: Payer: Medicaid Other | Attending: Nurse Practitioner | Admitting: Physical Therapy

## 2018-11-02 DIAGNOSIS — G8929 Other chronic pain: Secondary | ICD-10-CM | POA: Diagnosis present

## 2018-11-02 DIAGNOSIS — R252 Cramp and spasm: Secondary | ICD-10-CM | POA: Diagnosis present

## 2018-11-02 DIAGNOSIS — M545 Low back pain, unspecified: Secondary | ICD-10-CM

## 2018-11-02 DIAGNOSIS — M6281 Muscle weakness (generalized): Secondary | ICD-10-CM | POA: Diagnosis present

## 2018-11-02 NOTE — Therapy (Addendum)
[facility] [address], [place], [place], [address] Phone: [phone]   Fax:  [phone]  Physical Therapy Treatment  Patient Details  Name: Chloe Rodgers MRN: [id number] Date of Birth: 1990/12/31 Referring Provider (PT): Emelia Loron, NP    Encounter Date: 11/02/2018  PT End of Session - 11/02/18 1248    Visit Number  7    Date for PT Re-Evaluation  12/28/18    Authorization Type  Medicaid    Authorization Time Period  11/01/18-12/12/18    Authorization - Visit Number  1    Authorization - Number of Visits  6    PT Start Time  0930    PT Stop Time  1005    PT Time Calculation (min)  35 min    Activity Tolerance  Patient tolerated treatment well    Behavior During Therapy  [facility] for tasks assessed/performed       Past Medical History:  Diagnosis Date  . Anemia   . Anxiety   . Chlamydia   . Complication of anesthesia    ??, seizure with wisdom teeth  . Depression    not on meds, sees a therapist  . Infection    UTI  . Kidney stone    kidney stones  . Migraine   . PID (acute pelvic inflammatory disease) 2014  . PONV (postoperative nausea and vomiting)   . Psoriasis   . Seizures [facility])    July 2013 - [place]    Past Surgical History:  Procedure Laterality Date  . CESAREAN SECTION    . CESAREAN SECTION  06/06/2012   Procedure: CESAREAN SECTION;  Surgeon: Woodroe Mode, MD;  Location: [facility];  Service: Obstetrics;  Laterality: N/A;  . CESAREAN SECTION N/A 03/30/2017   Procedure: REPEAT CESAREAN SECTION;  Surgeon: Florian Buff, MD;  Location: [place];  Service: Obstetrics;  Laterality: N/A;  . SKIN GRAFT     off abd, onto arm  . WISDOM TOOTH EXTRACTION      There were no vitals filed for this visit.  Subjective Assessment - 11/02/18 1011    Subjective  Patient reports pain is bad. Patient feels the dry needling helps and hands on therapy works for her. Patient is not able to get  in and          [facility] PT Assessment - 11/02/18 0001      Assessment   Medical Diagnosis  PELVIC PAIN    Referring Provider (PT)  Emelia Loron, NP       Precautions   Precautions  None      Restrictions   Weight Bearing Restrictions  No      Home Environment   Living Environment  Private residence    Living Arrangements  Children      Prior Function   Level of Independence  Independent    Vocation  On disability      Cognition   Overall Cognitive Status  Within Functional Limits for tasks assessed      Posture/Postural Control   Posture/Postural Control  Postural limitations    Postural Limitations  Rounded Shoulders;Increased thoracic kyphosis      PROM   Right Hip External Rotation   45    Right Hip Internal Rotation   10    Left Hip Flexion  90    Left Hip External Rotation   50    Left Hip Internal Rotation   20  Strength   Right Hip Flexion  4-/5    Right Hip External Rotation   4-/5    Right Hip ABduction  4-/5    Right Hip ADduction  4-/5    Left Hip Flexion  4/5    Left Hip External Rotation  4/5    Left Hip ABduction  4/5    Left Hip ADduction  4-/5      Right Hip   Right Hip Flexion  90                   [facility] Adult PT Treatment/Exercise - 11/02/18 0001      Self-Care   Self-Care  Other Self-Care Comments    Other Self-Care Comments   education on how to make a SI belt at home using a belt and where to place it or wearing tight jeans to secure the SI joint      Therapeutic Activites    Therapeutic Activities  Lifting;Other Therapeutic Activities    Lifting  lifting her little ones and pushing a stroller with spinal neutral    Other Therapeutic Activities  correct toileting technique to relax the pelvic floor      Neuro Re-ed    Neuro Re-ed Details   diaphragmatic breathing in sitting and during toileting and knees above the hips; educated patient on how to massage the perineum with a light touch and no pain greater than 3/10,  self massage to the lumbar and gluteals with using a tennis ball to relax the muscles      Lumbar Exercises: Stretches   Active Hamstring Stretch  Right;Left;2 reps;30 seconds   sitting   Piriformis Stretch  Right;Left;2 reps;30 seconds   stting   Other Lumbar Stretch Exercise  supine butterfly stretch             PT Education - 11/02/18 1229    Education Details  Access Code: [email or web address] ; lifting techniques; toileting techniques; pushing a stroller; walking program    Person(s) Educated  Patient    Methods  Explanation;Demonstration;Verbal cues;Handout    Comprehension  Returned demonstration;Verbalized understanding       PT Short Term Goals - 09/13/18 1511      PT SHORT TERM GOAL #1   Title  ind with initial HEP    Status  Achieved        PT Long Term Goals - 11/02/18 1254      PT LONG TERM GOAL #1   Title  pt will be ind with advanced HEP for pain management    Baseline  still learning exercises    Time  8    Period  Weeks    Status  On-going      PT LONG TERM GOAL #2   Title  Pt will report 50% less pain during daily activities so she can perform tasks such as meal prep without assistance    Baseline  having trouble with managing pain    Time  8    Period  Weeks    Status  Partially Met      PT LONG TERM GOAL #3   Title  Pt will demonstrate 4+/5 MMT on bilateral hip flexion and abduction for improved upright posture during functional activities.    Baseline  4-/5    Time  8    Period  Weeks    Status  On-going      PT LONG TERM GOAL #4   Title  pt will be able  to perfrom functional transfers without increased pain due to increased core strength.    Baseline  transfers today without increased pain    Time  8    Period  Weeks    Status  Achieved       Physical  Therapy Telehealth Visit:  I connected with Chloe Rodgers today at 9:30 AM by [facility] video conference and verified that I am speaking with the correct person using two identifiers.  I  discussed the limitations, risks, security and privacy concerns of performing an evaluation and management service by Webex and the availability of in person appointments.  I also discussed with the patient that there may be a patient responsible charge related to this service. The patient expressed understanding and agreed to proceed.    The patient's address was confirmed.  Identified to the patient that therapist is a licensed Physical Therapist  in the state of [place].  Verified phone # as [phone]  to call in case of technical difficulties.         Plan - 11/02/18 1230    Clinical Impression Statement  Patient reports she is having increased pain in the lumbar area with all activities. Patient reports pelvic pain with urination and bowel movements. Patient is having pain with bowel mvements and urination. Patient is having difficulty with controlling her pain. She reports dry needling and manual work has assisted in pain reduction. Patient has 3 children at home that are under the age of 32 and youngest is 1. Patient was educated on how to lift her little ones and push in a stroller. Patient was educated in how to use a SI belt to stabilize her SI joint since she is not able to purchase one at this time. Patient has learned ways to massage her perineum without increased pain. Patient understands ways to stretch and manage her pain from todays HEP. Patient program was done through telehealth for this session. Patient will benefit fron pain management, manual work to reduce muscle trigger points and education on correct exercises for the core.     Rehab Potential  Excellent    Clinical Impairments Affecting Rehab Potential  scoliosis, chronic pain    PT Frequency  1x / week    PT Duration  8 weeks    PT Treatment/Interventions  ADLs/Self Care Home Management;Biofeedback;Cryotherapy;Software engineer;Therapeutic activities;Therapeutic exercise;Neuromuscular  re-education;Patient/family education;Manual techniques;Dry needling;Scar mobilization;Taping;Passive range of motion    PT Next Visit Plan  dry needling to lumbar and pelvic floor; assess the pelvic alignment; manual work to abdomen, lumbar and perineal     PT Home Exercise Plan  Access Code: [email or web address]     Recommended Other Services  MD signed all notes; renewal note sent to MD    Consulted and Agree with Plan of Care  Patient       Patient will benefit from skilled therapeutic intervention in order to improve the following deficits and impairments:  Pain, Increased fascial restricitons, Decreased strength, Decreased coordination, Postural dysfunction  Visit Diagnosis: Muscle weakness (generalized) - Plan: PT plan of care cert/re-cert  Cramp and spasm - Plan: PT plan of care cert/re-cert  Chronic low back pain without sciatica, unspecified back pain laterality - Plan: PT plan of care cert/re-cert     Problem List Patient Active Problem List   Diagnosis Date Noted  . Chronic PID (chronic pelvic inflammatory disease) 12/13/2017  . Status post repeat low transverse cesarean section 03/30/2017  . Pelvic inflammatory disease (PID) 02/21/2013  .  Migraines 07/24/2012  . Previous cesarean delivery, antepartum condition or complication 93/24/1991  . Obese 03/30/2012  . Seizure disorder ([place]) 02/03/2012  . Family history of breast cancer in mother 02/03/2012   Earlie Counts, PT 11/02/2018, 12:58 PM   [facility] [address], [place], [place], [address] Phone: [phone]   Fax:  [phone]  Name: Chloe Rodgers MRN: [id number] Date of Birth: 10/19/1990  PHYSICAL THERAPY DISCHARGE SUMMARY  Visits from Start of Care: 7  Current functional level related to goals / functional outcomes: See above. Patient has not returned since last visit and during COVID 19 period.    Remaining deficits: See above.    Education /  Equipment: HEP Plan:                                                    Patient goals were not met. Patient is being discharged due to not returning since the last visit.  Thank you for the referral. Earlie Counts, PT 12/12/18 10:30 AM  ?????

## 2018-11-02 NOTE — Patient Instructions (Signed)
Access Code: [email or web address]  URL: [email or web address]  Date: 11/02/2018  Prepared by: Eulis Foster   Exercises  Supine Diaphragmatic Breathing - 10 reps - 1 sets - 3x daily - 7x weekly  Hooklying Single Knee to Chest - 5 reps - 1 sets - 10 sec hold - 1x daily - 7x weekly  Supine Lower Trunk Rotation - 10 reps - 3 sets - 1x daily - 7x weekly  Supine Hamstring Stretch with Strap - 2 reps - 1 sets - 20 hold - 2x daily - 7x weekly  Supine Hip Adductor Stretch - 10 reps - 1 sets - 3x daily - 7x weekly  Seated Piriformis Stretch with Trunk Bend - 2 reps - 1 sets - 30 seconds hold - 1x daily - 7x weekly  Supine Caregiver Abdominal Wall 'ILU' Massage - 1 reps - 1 sets - 3 min hold - 1x daily - 7x weekly  Positioning on Toilet and Defecation Technique - 1 reps - 1 sets - 1x daily - 7x weekly  Seated Hamstring Stretch - 2 reps - 1 sets - 30 sec hold - 1x daily - 7x weekly  Seated Hip Adductor Stretch - 2 reps - 1 sets - 30 sec hold - 1x daily - 7x weekly  Patient Education  Lifting a Therapist, [facility] [address], [address] [place], [place] [address] Phone # [phone] Fax [phone]

## 2018-11-07 ENCOUNTER — Telehealth: Payer: Self-pay | Admitting: Physical Therapy

## 2018-11-07 NOTE — Telephone Encounter (Signed)
Late entry for 11/02/2018.  Patient was contacted today regarding transition if in-person OP Rehab Services to telehealth due to Covid-19. Pt consented to telehealth services, educated on MyChart signup, Webex App download, and was agreeable to receive information via (text/email) regarding telehealth services. Pt consented and was scheduled for appointment.  °

## 2018-11-09 ENCOUNTER — Ambulatory Visit: Payer: Medicaid Other

## 2018-11-16 ENCOUNTER — Telehealth: Payer: Self-pay | Admitting: Physical Therapy

## 2018-11-16 ENCOUNTER — Ambulatory Visit: Payer: Medicaid Other | Attending: Nurse Practitioner | Admitting: Physical Therapy

## 2018-11-16 NOTE — Telephone Encounter (Signed)
Called patient due to her missing her appointment at 13:00. Left a message.  Eulis Foster, PT @5 /01/2019@ 1:24 PM

## 2018-11-21 NOTE — Progress Notes (Deleted)
Called pt and pt informed me that she was not at home and needed to reschedule.  I advised pt that someone from the front office will contact her to reschedule her appt and to make sure that she is in a place where we obtain personal information.  Verified with pt if she has downloaded the [employer].  Pt stated that she has downloaded the app.  Pt did not have any other questions or concerns.

## 2018-11-23 ENCOUNTER — Other Ambulatory Visit: Payer: Self-pay | Admitting: *Deleted

## 2018-11-23 ENCOUNTER — Ambulatory Visit (INDEPENDENT_AMBULATORY_CARE_PROVIDER_SITE_OTHER): Payer: Medicaid Other | Admitting: *Deleted

## 2018-11-23 ENCOUNTER — Other Ambulatory Visit: Payer: Self-pay

## 2018-11-23 DIAGNOSIS — Z3490 Encounter for supervision of normal pregnancy, unspecified, unspecified trimester: Secondary | ICD-10-CM

## 2018-11-23 DIAGNOSIS — O99211 Obesity complicating pregnancy, first trimester: Secondary | ICD-10-CM

## 2018-11-23 DIAGNOSIS — O99340 Other mental disorders complicating pregnancy, unspecified trimester: Secondary | ICD-10-CM

## 2018-11-23 DIAGNOSIS — F329 Major depressive disorder, single episode, unspecified: Secondary | ICD-10-CM

## 2018-11-23 DIAGNOSIS — O99210 Obesity complicating pregnancy, unspecified trimester: Secondary | ICD-10-CM

## 2018-11-23 DIAGNOSIS — F32A Depression, unspecified: Secondary | ICD-10-CM

## 2018-11-23 MED ORDER — PRENATAL 27-1 MG PO TABS: 1.0000 | ORAL_TABLET | Freq: Every day | ORAL | 9 refills | Status: DC

## 2018-11-23 MED ORDER — AMBULATORY NON FORMULARY MEDICATION: 1.0000 | 0 refills | Status: DC

## 2018-11-23 NOTE — Progress Notes (Signed)
I have reviewed this chart and agree with the RN/CMA assessment and management.    K. Meryl Nolin Grell, M.D. Attending [facility]   

## 2018-11-23 NOTE — Progress Notes (Unsigned)
am

## 2018-11-23 NOTE — Progress Notes (Signed)
Informed her we may need to move appt due to needs to be at least 10 weeks for new ob visit.  Also offered appointment with Asher Muir due to + PHQ9 and gad 7 and hx depression/ anxiety. She accepted and would like before her new ob appt. I sent her babyscripts app to download once she gets home-.

## 2018-11-23 NOTE — Progress Notes (Addendum)
I connected with  Chloe Rodgers on 11/23/18 at 10:15 AM EDT by telephone and verified that I am speaking with the correct person using two identifiers.   I discussed the limitations, risks, security and privacy concerns of performing an evaluation and management service by telephone by webex and the availability of in person appointments. I also discussed with the patient that there may be a patient responsible charge related to this service. The patient expressed understanding and agreed to proceed. Had home pregnancy test positive but not yet verified in office due to covid 19- explained we will do upt in office at first visit. States had spotting some , then LMP 10/13/18 and then had a little spotting since then.- advised to go to [facility] if she has bleeding like a period or severe pain.  Linda,RN 11/23/2018  10:08 AM

## 2018-11-27 ENCOUNTER — Ambulatory Visit (INDEPENDENT_AMBULATORY_CARE_PROVIDER_SITE_OTHER): Payer: Medicaid Other | Admitting: Clinical

## 2018-11-27 ENCOUNTER — Telehealth: Payer: Self-pay

## 2018-11-27 ENCOUNTER — Other Ambulatory Visit: Payer: Self-pay

## 2018-11-27 DIAGNOSIS — F4323 Adjustment disorder with mixed anxiety and depressed mood: Secondary | ICD-10-CM

## 2018-11-27 MED ORDER — PROMETHAZINE HCL 25 MG PO TABS: 25.0000 mg | ORAL_TABLET | Freq: Four times a day (QID) | ORAL | 0 refills | Status: DC | PRN

## 2018-11-27 MED ORDER — DOCUSATE SODIUM 100 MG PO CAPS: 100.0000 mg | ORAL_CAPSULE | Freq: Two times a day (BID) | ORAL | 0 refills | Status: DC

## 2018-11-27 NOTE — BH Specialist Note (Signed)
Integrated Behavioral Health Initial Visit  MRN: [id number] Name: Chloe Rodgers  Number of Integrated Behavioral Health Clinician visits:: 1/6 Session Start time: 10:32  Session End time: 10:54 Total time: 20 minutes  Type of Service: Integrated Behavioral Health- Individual/Family Interpretor:No. Interpretor Name and Language: n/a   Warm Hand Off Completed.       SUBJECTIVE: Chloe Rodgers is a 28 y.o. female accompanied by n/a Patient was referred by Leroy Libman, MD for positive depression screen. Patient reports the following symptoms/concerns: Pt states her primary concerns today are nausea, constipation, diarrhea, migraine this pregnancy, along with chills the past 2 days. Pt thinks she may have a UTI, as she self-reports a history of PID, yeast infections, and UTIs. Pt is currently seeing a therapist, and is being treated by a psychiatrist at [facility] with [facility] medications, but is uncertain about the safety of taking in pregnancy, and cannot recall names of medications. Pt is no longer going to pain management since pregnancy, and also thinks she may be dehydrated. Pt attributes increase of symptoms of depression and anxiety to current physical symptoms.  Duration of problem: Increase in symptoms in early pregnancy; Severity of problem: moderately severe  OBJECTIVE: Mood: Normal and Affect: Appropriate Risk of harm to self or others: No plan to harm self or others  LIFE CONTEXT: Family and Social: Pt lives with her children (7,6,1) School/Work: - Self-Care: - Life Changes: Current pregnancy  GOALS ADDRESSED: Patient will: 1. Reduce symptoms of: anxiety, depression and stress 2. Increase knowledge and/or ability of: stress reduction  3. Demonstrate ability to: Improve medication compliance  INTERVENTIONS: Interventions utilized: Supportive Counseling and Psychoeducation and/or Health Education  Standardized Assessments completed: Not  Needed  ASSESSMENT: Patient currently experiencing Adjustment disorder with mixed anxious and depressed mood   Patient may benefit from psychoeducation and brief therapeutic interventions regarding coping with symptoms of anxiety and depression .  PLAN: 1. Follow up with behavioral health clinician on : One week to verify [facility] medications prescribed 2. Behavioral recommendations:  -Continue attending sessions with therapist and psychiatrist; talk to psychiatrist prior to discontinuing BH medications -Continue taking prenatal vitamin, as recommended by medical provider -Nurse will contact today to advise on medical symptoms 3. Referral(s): Integrated Behavioral Health Services (In Clinic) 4. "From scale of 1-10, how likely are you to follow plan?": -  Rae Lips, LCSW  Depression screen [facility] 2/9 11/23/2018 01/17/2018 03/28/2017 02/15/2017 02/04/2017  Decreased Interest 3 2 3 2 1   Down, Depressed, Hopeless 3 2 3 2 1   PHQ - 2 Score 6 4 6 4 2   Altered sleeping 3 3 3 3 3   Tired, decreased energy 3 3 3 3 3   Change in appetite 1 2 0 1 0  Feeling bad or failure about yourself  1 1 0 0 0  Trouble concentrating 3 3 3 1 1   Moving slowly or fidgety/restless 1 1 0 0 0  Suicidal thoughts 0 0 0 0 0  PHQ-9 Score 18 17 15 12 9   Some recent data might be hidden   GAD 7 : Generalized Anxiety Score 11/23/2018 01/17/2018 03/28/2017 02/15/2017  Nervous, Anxious, on Edge 3 3 3 1   Control/stop worrying 3 3 2 1   Worry too much - different things 3 3 2 1   Trouble relaxing 3 3 2 1   Restless 3 3 2 1   Easily annoyed or irritable 3 3 2 1   Afraid - awful might happen 0 0 0 0  Total GAD 7 Score  18 18 13 6          

## 2018-11-27 NOTE — Telephone Encounter (Signed)
Called pt in regards to f/u her concerns that she shared with Asher Muir.  Pt reports having nausea, that she passes gas a lot with some constipation, and she is having pain in her vaginal area and burning with urination.  I informed pt that can send in Phenergan 25 mg po tablet for nausea and Colace 100 mg po tablet for mild constipation.  I advised pt that I would have someone from the front office to contact her for an appt.  Pt verbalized understanding with no further questions.

## 2018-12-05 ENCOUNTER — Other Ambulatory Visit: Payer: Self-pay

## 2018-12-05 ENCOUNTER — Ambulatory Visit: Payer: Medicaid Other | Admitting: Clinical

## 2018-12-05 NOTE — BH Specialist Note (Signed)
Pt did not arrive at her Webex video meeting. Someone answered the phone, "hello", and the phone cut off. A second call went straight to voicemail; Left HIPPA-compliant message to call back Chloe Rodgers from [facility] [employer] at [phone], and left MyChart message.   Integrated Behavioral Health Follow Up Visit  MRN: [id number] Name: Chloe Rodgers [facility], LCSW

## 2018-12-09 ENCOUNTER — Emergency Department (HOSPITAL_COMMUNITY)
Admission: EM | Admit: 2018-12-09 | Discharge: 2018-12-09 | Disposition: A | Discharge status: Home / Self Care | Payer: Medicaid Other | Attending: Emergency Medicine | Admitting: Emergency Medicine

## 2018-12-09 ENCOUNTER — Other Ambulatory Visit: Payer: Self-pay

## 2018-12-09 DIAGNOSIS — R51 Headache: Secondary | ICD-10-CM | POA: Diagnosis present

## 2018-12-09 DIAGNOSIS — G43809 Other migraine, not intractable, without status migrainosus: Secondary | ICD-10-CM | POA: Diagnosis not present

## 2018-12-09 DIAGNOSIS — Z79899 Other long term (current) drug therapy: Secondary | ICD-10-CM | POA: Diagnosis not present

## 2018-12-09 MED ORDER — PROCHLORPERAZINE EDISYLATE 10 MG/2ML IJ SOLN
10.0000 mg | Freq: Once | INTRAMUSCULAR | Status: AC
  Administered 2018-12-09: 10 mg via INTRAMUSCULAR
  Filled 2018-12-09: qty 2

## 2018-12-09 NOTE — Discharge Instructions (Addendum)
Continue your current medications, follow-up with your doctor as needed, return for worsening symptoms

## 2018-12-09 NOTE — ED Triage Notes (Signed)
Pt with c/o migraine for past 2 hours and periumbilical abd pain. States took Tylenol with no relief, but did not take prescribed medication for migraines

## 2018-12-09 NOTE — ED Provider Notes (Signed)
[facility]-EMERGENCY DEPT Provider Note   CSN: [id number] Arrival date & time: 12/09/18  2145    History   Chief Complaint Chief Complaint  Patient presents with  . Headache    HPI Chloe Rodgers is a 28 y.o. female.     HPI Patient presents to the emergency room for evaluation of a headache.  Patient has history of migraines.  She started having a recurrent episode this evening.  Patient became concerned because she states that he has a history of seizures that are often brought on by her migraines.  Patient took some Tylenol at home prior to arrival.  It was not helping initially so she came to the ED.  Patient states at this point however she does feel the Tylenol kicking in.  Her headache is resolving but she does have some nausea still.  Denies any fevers or chills.  No recent injuries.  No vomiting or diarrhea. Past Medical History:  Diagnosis Date  . Anemia   . Anxiety   . Chlamydia   . Complication of anesthesia    ??, seizure with wisdom teeth  . Depression    not on meds, sees a therapist  . Infection    UTI  . Kidney stone    kidney stones  . Migraine   . PID (acute pelvic inflammatory disease) 2014  . PONV (postoperative nausea and vomiting)   . Psoriasis   . Seizures [facility])    July 2013 - [place]    Patient Active Problem List   Diagnosis Date Noted  . Supervision of low-risk pregnancy 11/23/2018  . Chronic PID (chronic pelvic inflammatory disease) 12/13/2017  . Status post repeat low transverse cesarean section 03/30/2017  . Pelvic inflammatory disease (PID) 02/21/2013  . Migraines 07/24/2012  . Previous cesarean delivery, antepartum condition or complication 03/30/2012  . Obese 03/30/2012  . Seizure disorder (HCC) 02/03/2012  . Family history of breast cancer in mother 02/03/2012    Past Surgical History:  Procedure Laterality Date  . CESAREAN SECTION    . CESAREAN SECTION  06/06/2012   Procedure: CESAREAN SECTION;   Surgeon: Adam Phenix, MD;  Location: [facility];  Service: Obstetrics;  Laterality: N/A;  . CESAREAN SECTION N/A 03/30/2017   Procedure: REPEAT CESAREAN SECTION;  Surgeon: Lazaro Arms, MD;  Location: [facility]THING SU[facility];  Service: Obstetrics;  Laterality: N/A;  . SKIN GRAFT     off abd, onto arm  . WISDOM TOOTH EXTRACTION       OB History    Gravida  6   Para  3   Term  3   Preterm  0   AB  2   Living  3     SAB  0   TAB  1   Ectopic  0   Multiple  0   Live Births  3            Home Medications    Prior to Admission medications   Medication Sig Start Date End Date Taking? Authorizing Provider  AMBULATORY NON FORMULARY MEDICATION 1 Device by Other route once a week. Medication Name: Blood pressure Cuff. Monitored regularly at home. DX:  Z34.90 11/23/18   Adam Phenix, MD  clindamycin (CLEOCIN) 300 MG capsule Take 1 capsule (300 mg total) by mouth 2 (two) times daily. 05/05/18   Donnetta Hutching, MD  docusate sodium (COLACE) 100 MG capsule Take 1 capsule (100 mg total) by mouth 2 (two) times daily. 11/27/18  Hermina StaggersErvin, Michael L, MD  doxycycline (VIBRAMYCIN) 100 MG capsule Take 1 capsule (100 mg total) by mouth 2 (two) times daily. 05/05/18   Donnetta Hutchingook, Brian, MD  HYDROcodone-acetaminophen (NORCO/VICODIN) 5-325 MG tablet Take 1 tablet by mouth every 4 (four) hours as needed. 05/05/18   Donnetta Hutchingook, Brian, MD  metroNIDAZOLE (METROGEL VAGINAL) 0.75 % vaginal gel Place applicator of medication into vagina every night for 5 nights. 09/02/18   Leftwich-Kirby, Wilmer FloorLisa A, CNM  ondansetron (ZOFRAN) 4 MG tablet Take 1 tablet (4 mg total) by mouth every 6 (six) hours. 05/05/18   Donnetta Hutchingook, Brian, MD  oxyCODONE HCl (ROXYBOND PO) Take 15 mg/day by mouth 1 day or 1 dose.    [provider]  Prenatal 27-1 MG TABS Take 1 tablet by mouth daily. 11/23/18   Conan Bowensavis, Kelly M, MD  promethazine (PHENERGAN) 25 MG tablet Take 1 tablet (25 mg total) by mouth every 6 (six) hours as needed for nausea or vomiting.  11/27/18   Hermina StaggersErvin, Michael L, MD    Family History Family History  Problem Relation Age of Onset  . Hypertension Mother   . Diabetes Mother   . Cancer Mother        BREAST  . Stroke Mother   . Seizures Mother   . Asthma Daughter   . Hypertension Maternal Grandmother   . Diabetes Maternal Grandmother   . Cancer Maternal Grandmother        BONE CANCER  . Other Neg Hx     Social History Social History   Tobacco Use  . Smoking status: Never Smoker  . Smokeless tobacco: Never Used  Substance Use Topics  . Alcohol use: No    Comment: socially but none with pregnancy  . Drug use: No     Allergies   Naproxen and Toradol [ketorolac tromethamine]   Review of Systems Review of Systems  All other systems reviewed and are negative.    Physical Exam Updated Vital Signs BP (!) 128/91   Pulse 80   Temp 98.4 F (36.9 C) (Oral)   Resp 19   Ht 1.676 m (5\' 6" )   Wt 99.8 kg   LMP 10/13/2018   SpO2 100%   BMI 35.51 kg/m   Physical Exam Vitals signs and nursing note reviewed.  Constitutional:      General: She is not in acute distress.    Appearance: She is well-developed.  HENT:     Head: Normocephalic and atraumatic.     Right Ear: External ear normal.     Left Ear: External ear normal.  Eyes:     General: No scleral icterus.       Right eye: No discharge.        Left eye: No discharge.     Conjunctiva/sclera: Conjunctivae normal.  Neck:     Musculoskeletal: Neck supple.     Trachea: No tracheal deviation.  Cardiovascular:     Rate and Rhythm: Normal rate and regular rhythm.  Pulmonary:     Effort: Pulmonary effort is normal. No respiratory distress.     Breath sounds: Normal breath sounds. No stridor. No wheezing or rales.  Abdominal:     General: Bowel sounds are normal. There is no distension.     Palpations: Abdomen is soft.     Tenderness: There is no abdominal tenderness. There is no guarding or rebound.  Musculoskeletal:        General: No  tenderness.  Skin:    General: Skin is warm and dry.  Findings: No rash.  Neurological:     Mental Status: She is alert.     Cranial Nerves: No cranial nerve deficit (no facial droop, extraocular movements intact, no slurred speech).     Sensory: No sensory deficit.     Motor: No abnormal muscle tone or seizure activity.     Coordination: Coordination normal.      ED Treatments / Results  Labs (all labs ordered are listed, but only abnormal results are displayed) Labs Reviewed - No data to display  EKG None  Radiology No results found.  Procedures Procedures (including critical care time)  Medications Ordered in ED Medications  prochlorperazine (COMPAZINE) injection 10 mg (10 mg Intramuscular Given 12/09/18 2217)     Initial Impression / Assessment and Plan / ED Course  I have reviewed the triage vital signs and the nursing notes.  Pertinent labs & imaging results that were available during my care of the patient were reviewed by me and considered in my medical decision making (see chart for details).   Pt presented with headache consistent with a migraine.  No thunder clap onset.  NO fevers or chills.  Doubt SAH, meningitis.  Sx improving significantly with tylenol.  Will give a dose of compazine to help with her nausea and headache.  Final Clinical Impressions(s) / ED Diagnoses   Final diagnoses:  Other migraine without status migrainosus, not intractable    ED Discharge Orders    None       Linwood Dibbles, MD 12/09/18 2239

## 2018-12-12 ENCOUNTER — Encounter: Payer: Medicaid Other | Admitting: Obstetrics and Gynecology

## 2018-12-15 ENCOUNTER — Ambulatory Visit: Payer: Medicaid Other

## 2018-12-18 NOTE — BH Specialist Note (Signed)
Integrated Behavioral Health Visit via Telemedicine (Telephone)  12/19/2018 Chloe Rodgers [id number]   Session Start time: 1:09  Session End time: 1:17 Total time: 8 minutes    Referring Provider: Vivien Rota, MD for positive depression/anxiety screen at previous visit Type of Visit: Telephonic Patient location: Outside Psychiatry office [facility] Provider location: [facility] All persons participating in visit: Patient Chloe Rodgers and Crystal  Confirmed patient's address: Yes  Confirmed patient's phone number: Yes  Any changes to demographics: No   Confirmed patient's insurance: Yes  Any changes to patient's insurance: No   Discussed confidentiality: No    The following statements were read to the patient and/or legal guardian that are established with the [facility] Provider.  "The purpose of this phone visit is to provide behavioral health care while limiting exposure to the coronavirus (COVID19).  There is a possibility of technology failure and discussed alternative modes of communication if that failure occurs."  "By engaging in this telephone visit, you consent to the provision of healthcare.  Additionally, you authorize for your insurance to be billed for the services provided during this telephone visit."   Patient and/or legal guardian consented to telephone visit: Yes (Pt consented to brief phone visit with [facility]; scheduled visit with psychiatry at same time)  PRESENTING CONCERNS: Patient and/or family reports the following symptoms/concerns: Pt states her primary concern today is possible UTI, and still uncertain the [facility] medications psychiatry has prescribed. Pt cannot remember the phone number to reach her therapist from [place] to reschedule her next appointment.  Duration of problem: Ongoing; Severity of problem: Undetermined  STRENGTHS (Protective Factors/Coping Skills): Attending appointments  GOALS ADDRESSED: Patient  will:  1.  Demonstrate ability to: Increase motivation to adhere to plan of care  INTERVENTIONS: Interventions utilized:  Supportive Counseling Standardized Assessments completed: Not Needed  ASSESSMENT: Patient currently experiencing Adjustment disorder with mixed anxiety and depressed mood  Patient may benefit from very brief therapeutic intervention today.  PLAN: 1. Follow up with behavioral health clinician on : As needed 2. Behavioral recommendations:  -Complete psychiatry appointment today; find out what medication psychiatrist prescribes, and take as prescribed. -Call [place] at [phone] to set up next appointment with therapist; if unable to reach via phone, go to website at [email or web address], and send a message via website email, to request another appointment.   3. Referral(s): [place] (In Clinic)  [place]

## 2018-12-19 ENCOUNTER — Ambulatory Visit (INDEPENDENT_AMBULATORY_CARE_PROVIDER_SITE_OTHER): Payer: Medicaid Other | Admitting: Clinical

## 2018-12-19 ENCOUNTER — Other Ambulatory Visit: Payer: Self-pay

## 2018-12-19 DIAGNOSIS — F4323 Adjustment disorder with mixed anxiety and depressed mood: Secondary | ICD-10-CM

## 2018-12-27 ENCOUNTER — Ambulatory Visit: Payer: Medicaid Other | Admitting: Physical Therapy

## 2018-12-28 ENCOUNTER — Telehealth: Payer: Self-pay | Admitting: Obstetrics & Gynecology

## 2018-12-28 ENCOUNTER — Encounter: Payer: Medicaid Other | Admitting: Obstetrics & Gynecology

## 2018-12-28 NOTE — Telephone Encounter (Signed)
Called and left a message that we rescheduled appointment. Spoke with Chloe Rodgers on yesterday, and she stated she wasn't sure if she would make the appointment because she would not have anyone to keep her kids.

## 2019-01-03 ENCOUNTER — Ambulatory Visit: Payer: Medicaid Other | Attending: Nurse Practitioner | Admitting: Physical Therapy

## 2019-01-08 ENCOUNTER — Encounter: Payer: Medicaid Other | Admitting: Obstetrics and Gynecology

## 2019-01-08 ENCOUNTER — Encounter: Payer: Self-pay | Admitting: Family Medicine

## 2019-01-10 ENCOUNTER — Telehealth: Payer: Self-pay | Admitting: *Deleted

## 2019-01-10 NOTE — Telephone Encounter (Signed)
Called pt regarding babyscripts.  Per review, pt has never logged a BP into the app but received a cuff from bab[employer] on 12/12/18.  Pt not available to speak at the time of the call.  Requested that the pt contact the clinic at her soonest availability.  Mychart message sent.

## 2019-01-22 ENCOUNTER — Other Ambulatory Visit: Payer: Self-pay

## 2019-01-22 ENCOUNTER — Encounter: Payer: Self-pay | Admitting: Physical Therapy

## 2019-01-22 ENCOUNTER — Ambulatory Visit: Payer: Medicaid Other | Attending: Nurse Practitioner | Admitting: Physical Therapy

## 2019-01-22 DIAGNOSIS — M545 Low back pain, unspecified: Secondary | ICD-10-CM

## 2019-01-22 DIAGNOSIS — R252 Cramp and spasm: Secondary | ICD-10-CM | POA: Diagnosis present

## 2019-01-22 DIAGNOSIS — M6281 Muscle weakness (generalized): Secondary | ICD-10-CM | POA: Diagnosis not present

## 2019-01-22 DIAGNOSIS — G8929 Other chronic pain: Secondary | ICD-10-CM | POA: Insufficient documentation

## 2019-01-22 NOTE — Therapy (Signed)
[facility] [address] [place], [place], [address] Phone: [phone]   Fax:  [phone]  Physical Therapy Evaluation  Patient Details  Name: Chloe Rodgers MRN: [id number] Date of Birth: 06/23/1991 Referring Provider (PT): Carmel Sacramentoyler Terry, NP   Encounter Date: 01/22/2019  PT End of Session - 01/22/19 1040    Visit Number  1    Date for PT Re-Evaluation  04/16/19    Authorization Type  Medicaid    PT Start Time  1000    PT Stop Time  1030    PT Time Calculation (min)  30 min    Activity Tolerance  Patient tolerated treatment well;No increased pain    Behavior During Therapy  WFL for tasks assessed/performed       Past Medical History:  Diagnosis Date  . Anemia   . Anxiety   . Chlamydia   . Complication of anesthesia    ??, seizure with wisdom teeth  . Depression    not on meds, sees a therapist  . Infection    UTI  . Kidney stone    kidney stones  . Migraine   . PID (acute pelvic inflammatory disease) 2014  . PONV (postoperative nausea and vomiting)   . Psoriasis   . Seizures [facility](HCC)    [place]    Past Surgical History:  Procedure Laterality Date  . CESAREAN SECTION    . CESAREAN SECTION  06/06/2012   Procedure: CESAREAN SECTION;  Surgeon: Adam PhenixJames G Arnold, MD;  Location: [facility];  Service: Obstetrics;  Laterality: N/A;  . CESAREAN SECTION N/A 03/30/2017   Procedure: REPEAT CESAREAN SECTION;  Surgeon: Lazaro ArmsEure, Luther H, MD;  [facility];  Service: Obstetrics;  Laterality: N/A;  . SKIN GRAFT     off abd, onto arm  . WISDOM TOOTH EXTRACTION      There were no vitals filed for this visit.   Subjective Assessment - 01/22/19 1005    Subjective  Patient is still in pain. She takes her medicine that eases off the pain. No pregnancy. I have been doing exercises that have helped a little. Had to stop therapy due to car trouble and no one to watch her kids.    Patient Stated  Goals  feel better and less pain    Currently in Pain?  Yes    Pain Score  8     Pain Location  Abdomen   pelvis, low back   Pain Orientation  Mid;Lower    Pain Descriptors / Indicators  Sharp    Pain Type  Chronic pain    Pain Onset  More than a month ago    Pain Frequency  Constant    Aggravating Factors   getting up, layng down and getting up, not taking medication    Pain Relieving Factors  medication    Multiple Pain Sites  No         [facility] PT Assessment - 01/22/19 0001      Assessment   Medical Diagnosis  R10.2 Chronic Pelvic pain in female    Referring Provider (PT)  Carmel Sacramentoyler Terry, NP    Onset Date/Surgical Date  --   chronic, 2012 estimate   Prior Therapy  yes      Precautions   Precautions  None      Restrictions   Weight Bearing Restrictions  No      Balance Screen   Has the patient fallen in the past  6 months  No    Has the patient had a decrease in activity level because of a fear of falling?   No    Is the patient reluctant to leave their home because of a fear of falling?   No      Home Film/video editor residence      Prior Function   Level of Independence  Independent      Cognition   Overall Cognitive Status  Within Functional Limits for tasks assessed      Posture/Postural Control   Posture/Postural Control  Postural limitations    Postural Limitations  Rounded Shoulders;Forward head      ROM / Strength   AROM / PROM / Strength  AROM;PROM;Strength      AROM   Lumbar Flexion  full    Lumbar Extension  decreased by 50%    Lumbar - Right Side Bend  decreased by 25%    Lumbar - Left Side Bend  decreased by 25%    Lumbar - Right Rotation  full    Lumbar - Left Rotation  full      PROM   Right Hip External Rotation   45    Left Hip External Rotation   65      Strength   Right Hip Extension  4/5    Right Hip ABduction  4/5    Right Hip ADduction  4/5    Left Hip Extension  4/5    Left Hip ABduction  4/5    Left  Hip ADduction  4/5      Palpation   SI assessment   right ilium rotated anteriorly,     Palpation comment  tenderness located along the c-section scar, tenderness located in the abdomen,, lumbar paraspinals, gluteal, external levator ani and obturator internist      Transfers   Transfers  --   painful with bed mobility               Objective measurements completed on examination: See above findings.    Pelvic Floor Special Questions - 01/22/19 0001    Prior Pregnancies  Yes    Number of C-Sections  3    Currently Sexually Active  Yes    Is this Painful  Yes    Marinoff Scale  discomfort that does not affect completion    Urinary Leakage  Yes   frequent UTI, PID, Yeast infections   Pad use  1-2    Activities that cause leaking  Other   no certian things cause leakage   Urinary urgency  No    Fecal incontinence  No    Pelvic Floor Internal Exam  deferred due to being on her cycle    Exam Type  Deferred               PT Education - 01/22/19 1038    Education Details  information on abdominal massage and C-section scar    Person(s) Educated  Patient    Methods  Explanation;Demonstration;Handout    Comprehension  Verbalized understanding;Returned demonstration       PT Short Term Goals - 01/22/19 1052      PT SHORT TERM GOAL #1   Title  ind with initial HEP    Time  4    Period  Weeks    Status  New    Target Date  02/19/19      PT SHORT TERM GOAL #2  Title  pt will tolerate internal soft tissue assessment and treatment as needed    Time  4    Period  Weeks    Status  New    Target Date  02/19/19        PT Long Term Goals - 01/22/19 1053      PT LONG TERM GOAL #1   Title  pt will be ind with advanced HEP for pain management    Baseline  not educated yet    Time  12    Period  Weeks    Status  New    Target Date  04/16/19      PT LONG TERM GOAL #2   Title  Pt will report 50% less pain during daily activities so she can perform tasks  such as meal prep without assistance    Baseline  pain level is 8/10    Time  12    Period  Weeks    Status  New    Target Date  04/16/19      PT LONG TERM GOAL #3   Title  Pt will demonstrate 4+/5 MMT on bilateral hip flexion and abduction for improved upright posture during functional activities.    Baseline  4/5    Time  12    Period  Weeks    Status  New    Target Date  04/16/19      PT LONG TERM GOAL #4   Title  pt will be able to perfrom functional transfers without increased pain due to increased core strength.    Baseline  transfers with 8/10 pain    Time  12    Period  Weeks    Status  New    Target Date  04/16/19             Plan - 01/22/19 1041    Clinical Impression Statement  Patient has chronic pelvic pain since 2012 when she had her last child. Patient reports abdominal, pelvic and back pain at level 8/10. Patient has tenderness located in abdominal, lumbar, gluteal, and plevic floor. Patient will have increased pain with transitional movments, going from sit to stand or  supne to standing. Lumbar ROM is limited by 25%. Patient reports difficulty with constipation. Bilateral hip strength is 4/5. Abdominal strength is 2/5. Tenderness and fascial tightness in c-section scar. Physical therapist will assess pelvic floor strength in the future when she is not on her menstraul cycle. Patient reports urinary leakage but not with activity. Patient will benefit from skilled therapy to reduce her pain, improve strength and overall function.    Personal Factors and Comorbidities  Comorbidity 1;Comorbidity 2;Time since onset of injury/illness/exacerbation;Fitness    Comorbidities  acute pelvic inflammatory disease, seizures    Examination-Activity Limitations  Bed Mobility;Locomotion Level;Transfers;Continence;Toileting    Examination-Participation Restrictions  Community Activity;Shop;Laundry;Meal Prep    Stability/Clinical Decision Making  Evolving/Moderate complexity     Clinical Decision Making  Moderate    Rehab Potential  Good    PT Frequency  1x / week    PT Duration  12 weeks    PT Treatment/Interventions  Biofeedback;Cryotherapy;Electrical Stimulation;Moist Heat;Ultrasound;Therapeutic exercise;Therapeutic activities;Neuromuscular re-education;Patient/family education;Passive range of motion;Manual techniques;Spinal Manipulations;Joint Manipulations    PT Next Visit Plan  go over c-section scar, bulging of pelvic floor, hip stretches, correct ilium, lumbar mobilization    Consulted and Agree with Plan of Care  Patient       Patient will benefit from skilled therapeutic intervention in  order to improve the following deficits and impairments:  Decreased coordination, Decreased range of motion, Increased fascial restricitons, Decreased endurance, Increased muscle spasms, Decreased activity tolerance, Pain, Decreased scar mobility, Impaired flexibility, Decreased mobility, Decreased strength  Visit Diagnosis: 1. Muscle weakness (generalized)   2. Cramp and spasm   3. Chronic low back pain without sciatica, unspecified back pain laterality        Problem List Patient Active Problem List   Diagnosis Date Noted  . Supervision of low-risk pregnancy 11/23/2018  . Chronic PID (chronic pelvic inflammatory disease) 12/13/2017  . Status post repeat low transverse cesarean section 03/30/2017  . Pelvic inflammatory disease (PID) 02/21/2013  . Migraines 07/24/2012  . Previous cesarean delivery, antepartum condition or complication 03/30/2012  . Obese 03/30/2012  . Seizure disorder (HCC) 02/03/2012  . Family history of breast cancer in mother 02/03/2012    Eulis FosterCheryl Gray, PT 01/22/19 10:56 AM    [facility] [address], [address] [place], [place], [address] Phone: [phone]   Fax:  [phone]  Name: Chloe Rodgers MRN: [id number] Date of Birth: 1990-11-13

## 2019-01-22 NOTE — Patient Instructions (Addendum)
About Abdominal Massage  Abdominal massage, also called external colon massage, is a self-treatment circular massage technique that can reduce and eliminate gas and ease constipation. The colon naturally contracts in waves in a clockwise direction starting from inside the right hip, moving up toward the ribs, across the belly, and down inside the left hip.  When you perform circular abdominal massage, you help stimulate your colon's normal wave pattern of movement called peristalsis.  It is most beneficial when done after eating.  Positioning You can practice abdominal massage with oil while lying down, or in the shower with soap.  Some people find that it is just as effective to do the massage through clothing while sitting or standing.  How to Massage Start by placing your finger tips or knuckles on your right side, just inside your hip bone.  . Make small circular movements while you move upward toward your rib cage.   . Once you reach the bottom right side of your rib cage, take your circular movements across to the left side of the bottom of your rib cage.  . Next, move downward until you reach the inside of your left hip bone.  This is the path your feces travel in your colon. . Continue to perform your abdominal massage in this pattern for 10 minutes each day.     You can apply as much pressure as is comfortable in your massage.  Start gently and build pressure as you continue to practice.  Notice any areas of pain as you massage; areas of slight pain may be relieved as you massage, but if you have areas of significant or intense pain, consult with your healthcare provider.  Other Considerations . General physical activity including bending and stretching can have a beneficial massage-like effect on the colon.  Deep breathing can also stimulate the colon because breathing deeply activates the same nervous system that supplies the colon.   . Abdominal massage should always be used in  combination with a bowel-conscious diet that is high in the proper type of fiber for you, fluids (primarily water), and a regular exercise program. [facility] [address], [address] , [place] [address] Phone # [phone] Fax [phone]  

## 2019-01-25 ENCOUNTER — Encounter: Payer: Medicaid Other | Admitting: Obstetrics & Gynecology

## 2019-01-25 ENCOUNTER — Telehealth: Payer: Self-pay | Admitting: Obstetrics & Gynecology

## 2019-01-25 NOTE — Telephone Encounter (Signed)
Attempted to call patient to get her rescheduled for her missed new ob appointment. No answer, left voicemail for patient to give the office a call back to be rescheduled. No show letter mailed. Home number also called and it just rang busy

## 2019-02-01 ENCOUNTER — Other Ambulatory Visit: Payer: Self-pay

## 2019-02-01 ENCOUNTER — Encounter: Payer: Self-pay | Admitting: Physical Therapy

## 2019-02-01 ENCOUNTER — Ambulatory Visit: Payer: Medicaid Other | Admitting: Physical Therapy

## 2019-02-01 DIAGNOSIS — M6281 Muscle weakness (generalized): Secondary | ICD-10-CM | POA: Diagnosis not present

## 2019-02-01 DIAGNOSIS — G8929 Other chronic pain: Secondary | ICD-10-CM

## 2019-02-01 DIAGNOSIS — M545 Low back pain, unspecified: Secondary | ICD-10-CM

## 2019-02-01 DIAGNOSIS — R252 Cramp and spasm: Secondary | ICD-10-CM

## 2019-02-01 NOTE — Therapy (Signed)
[facility] [address], [place], [place], [address] Phone: [phone]   Fax:  [phone]  Physical Therapy Treatment  Patient Details  Name: Chloe Rodgers MRN: [id number] Date of Birth: 1990/09/23 Referring Provider (PT): Emelia Loron, NP   Encounter Date: 02/01/2019  PT End of Session - 02/01/19 1418    Visit Number  2    Date for PT Re-Evaluation  04/16/19    Authorization Type  Medicaid    Authorization Time Period  7/23-8/12    Authorization - Visit Number  2    Authorization - Number of Visits  10    PT Start Time  1400    PT Stop Time  0177    PT Time Calculation (min)  45 min    Activity Tolerance  Patient tolerated treatment well;No increased pain    Behavior During Therapy  WFL for tasks assessed/performed       Past Medical History:  Diagnosis Date  . Anemia   . Anxiety   . Chlamydia   . Complication of anesthesia    ??, seizure with wisdom teeth  . Depression    not on meds, sees a therapist  . Infection    UTI  . Kidney stone    kidney stones  . Migraine   . PID (acute pelvic inflammatory disease) 2014  . PONV (postoperative nausea and vomiting)   . Psoriasis   . Seizures [facility])    July 2013 - [place]    Past Surgical History:  Procedure Laterality Date  . CESAREAN SECTION    . CESAREAN SECTION  06/06/2012   Procedure: CESAREAN SECTION;  Surgeon: Woodroe Mode, MD;  Location: [place] [facility];  Service: Obstetrics;  Laterality: N/A;  . CESAREAN SECTION N/A 03/30/2017   Procedure: REPEAT CESAREAN SECTION;  Surgeon: Florian Buff, MD;  Location: [place];  Service: Obstetrics;  Laterality: N/A;  . SKIN GRAFT     off abd, onto arm  . WISDOM TOOTH EXTRACTION      There were no vitals filed for this visit.  Subjective Assessment - 02/01/19 1403    Subjective  I feel better when I do the exercises.    Patient Stated Goals  feel better and less pain    Currently in Pain?  Yes     Pain Score  6     Pain Location  Abdomen   pelvis, low back   Pain Orientation  Mid;Lower    Pain Descriptors / Indicators  Dull;Sharp    Pain Type  Chronic pain    Pain Onset  More than a month ago    Pain Frequency  Constant    Aggravating Factors   getting up, laying down and getting up, not taking medication    Pain Relieving Factors  exercise, medication                       [facility] Adult PT Treatment/Exercise - 02/01/19 0001      Self-Care   Self-Care  Other Self-Care Comments    Other Self-Care Comments   educated patient on underware that will not put pressure on the c-section scar      Therapeutic Activites    Therapeutic Activities  Other Therapeutic Activities    Other Therapeutic Activities  instructed patient on correct toileting technique, diaphragmatic breathing to relax the pelvic floor       Lumbar Exercises: Stretches   Single Knee to Chest  Stretch  Right;Left;2 reps;30 seconds   supine   Lower Trunk Rotation  2 reps;30 seconds   supine     Manual Therapy   Manual Therapy  Soft tissue mobilization;Myofascial release    Soft tissue mobilization  Scar massage to c-section scar with firm pressure on the sides of the scar and directly  on the scar    Myofascial Release  release of the intestines off the bladder, release of the mesenteric, release of the pubovesical ligaments             PT Education - 02/01/19 1446    Education Details  Access Code: [email or web address]; education on toileting technique, education on correct underwear to wear that  does not place pressure on her c-section scar    Person(s) Educated  Patient    Methods  Explanation;Demonstration;Verbal cues;Handout    Comprehension  Returned demonstration;Verbalized understanding       PT Short Term Goals - 02/01/19 1454      PT SHORT TERM GOAL #1   Title  ind with initial HEP    Time  4    Period  Weeks    Status  On-going    Target Date  02/19/19      PT SHORT TERM GOAL  #2   Title  pt will tolerate internal soft tissue assessment and treatment as needed    Time  4    Period  Weeks    Status  New    Target Date  02/19/19        PT Long Term Goals - 01/22/19 1053      PT LONG TERM GOAL #1   Title  pt will be ind with advanced HEP for pain management    Baseline  not educated yet    Time  12    Period  Weeks    Status  New    Target Date  04/16/19      PT LONG TERM GOAL #2   Title  Pt will report 50% less pain during daily activities so she can perform tasks such as meal prep without assistance    Baseline  pain level is 8/10    Time  12    Period  Weeks    Status  New    Target Date  04/16/19      PT LONG TERM GOAL #3   Title  Pt will demonstrate 4+/5 MMT on bilateral hip flexion and abduction for improved upright posture during functional activities.    Baseline  4/5    Time  12    Period  Weeks    Status  New    Target Date  04/16/19      PT LONG TERM GOAL #4   Title  pt will be able to perfrom functional transfers without increased pain due to increased core strength.    Baseline  transfers with 8/10 pain    Time  12    Period  Weeks    Status  New    Target Date  04/16/19            Plan - 02/01/19 1405    Clinical Impression Statement  After therapy pain decreased to 5/10. Patient is able to demonstrate correct technique for toileting. Patient has decreased abdominal pain with performing her abdominal massage at home. Patient has not met goals at this time due to just starting therapy. Patient will benefit from skilled therapy to reduce her pain, improve strength and  overall function.    Personal Factors and Comorbidities  Comorbidity 1;Comorbidity 2;Time since onset of injury/illness/exacerbation;Fitness    Comorbidities  acute pelvic inflammatory disease, seizures    Examination-Activity Limitations  Bed Mobility;Locomotion Level;Transfers;Continence;Toileting    Examination-Participation Restrictions  Community  Activity;Shop;Laundry;Meal Prep    Stability/Clinical Decision Making  Evolving/Moderate complexity    Rehab Potential  Good    PT Frequency  1x / week    PT Duration  12 weeks    PT Treatment/Interventions  Biofeedback;Cryotherapy;Electrical Stimulation;Moist Heat;Ultrasound;Therapeutic exercise;Therapeutic activities;Neuromuscular re-education;Patient/family education;Passive range of motion;Manual techniques;Spinal Manipulations;Joint Manipulations    PT Next Visit Plan  go over c-section scar, bulging of pelvic floor, how to get out of the tub,  correct ilium, lumbar mobilization    PT Home Exercise Plan  [email or web address]    Consulted and Agree with Plan of Care  Patient       Patient will benefit from skilled therapeutic intervention in order to improve the following deficits and impairments:  Decreased coordination, Decreased range of motion, Increased fascial restricitons, Decreased endurance, Increased muscle spasms, Decreased activity tolerance, Pain, Decreased scar mobility, Impaired flexibility, Decreased mobility, Decreased strength  Visit Diagnosis: 1. Muscle weakness (generalized)   2. Cramp and spasm   3. Chronic low back pain without sciatica, unspecified back pain laterality        Problem List Patient Active Problem List   Diagnosis Date Noted  . Supervision of low-risk pregnancy 11/23/2018  . Chronic PID (chronic pelvic inflammatory disease) 12/13/2017  . Status post repeat low transverse cesarean section 03/30/2017  . Pelvic inflammatory disease (PID) 02/21/2013  . Migraines 07/24/2012  . Previous cesarean delivery, antepartum condition or complication 29/29/0903  . Obese 03/30/2012  . Seizure disorder ([place]) 02/03/2012  . Family history of breast cancer in mother 02/03/2012    Earlie Counts, PT 02/01/19 2:58 PM   [facility] [address] [place], [place], [address] Phone: [phone]   Fax:   [phone]  Name: Chloe Rodgers MRN: [id number] Date of Birth: September 01, 1990

## 2019-02-01 NOTE — Patient Instructions (Signed)
Access Code: [email or web address]  URL: [email or web address]  Date: 02/01/2019  Prepared by: Earlie Counts   Exercises Supine Diaphragmatic Breathing - 10 reps - 1 sets - 3x daily - 7x weekly Hooklying Single Knee to Chest - 5 reps - 1 sets - 10 sec hold - 1x daily - 7x weekly Supine Lower Trunk Rotation - 10 reps - 3 sets - 1x daily - 7x weekly Supine Hamstring Stretch with Strap - 2 reps - 1 sets - 20 hold - 2x daily - 7x weekly Seated Piriformis Stretch with Trunk Bend - 2 reps - 1 sets - 30 seconds hold - 1x daily - 7x weekly Supine Caregiver Abdominal Wall 'ILU' Massage - 1 reps - 1 sets - 3 min hold - 1x daily - 7x weekly Positioning on Toilet and Defecation Technique - 1 reps - 1 sets - 1x daily - 7x weekly Seated Hamstring Stretch - 2 reps - 1 sets - 30 sec hold - 1x daily - 7x weekly Seated Hip Adductor Stretch - 2 reps - 1 sets - 30 sec hold - 1x daily - 7x weekly Supine Butterfly Groin Stretch - 2 reps - 1 sets - 30 sec hold - 1x daily - 7x weekly Quadruped Cat Camel - 10 reps - 1 sets - 1x daily - 7x weekly Child's Pose Stretch - 2 reps - 1 sets - 30 sec hold - 1x daily - 7x weekly Childs Pose Side Bend - 2 reps - 1 sets - 30 sec hold - 1x daily - 7x weekly Hooklying Isometric Hip Flexion - 10 reps - 1 sets - 5 sec hold - 1x daily - 7x weekly Patient Education Lifting a Barrister's clerk Outpatient Rehab [address] [place], [place] [address] Phone # [phone] Fax [phone]

## 2019-02-08 ENCOUNTER — Other Ambulatory Visit: Payer: Self-pay

## 2019-02-09 ENCOUNTER — Other Ambulatory Visit: Payer: Self-pay | Admitting: Critical Care Medicine

## 2019-02-09 DIAGNOSIS — Z20822 Contact with and (suspected) exposure to covid-19: Secondary | ICD-10-CM

## 2019-02-09 NOTE — Progress Notes (Signed)
lab

## 2019-02-11 LAB — NOVEL CORONAVIRUS, NAA: SARS-CoV-2, NAA: NOT DETECTED

## 2019-02-21 ENCOUNTER — Encounter: Payer: Self-pay | Admitting: Physical Therapy

## 2019-02-21 ENCOUNTER — Other Ambulatory Visit: Payer: Self-pay

## 2019-02-21 ENCOUNTER — Ambulatory Visit: Payer: Medicaid Other | Attending: Nurse Practitioner | Admitting: Physical Therapy

## 2019-02-21 DIAGNOSIS — M545 Low back pain, unspecified: Secondary | ICD-10-CM

## 2019-02-21 DIAGNOSIS — M6281 Muscle weakness (generalized): Secondary | ICD-10-CM | POA: Diagnosis not present

## 2019-02-21 DIAGNOSIS — R252 Cramp and spasm: Secondary | ICD-10-CM | POA: Diagnosis present

## 2019-02-21 DIAGNOSIS — G8929 Other chronic pain: Secondary | ICD-10-CM | POA: Diagnosis present

## 2019-02-21 NOTE — Therapy (Signed)
[facility] [address], [address] [place], [place], [address] Phone: [phone]   Fax:  [phone]  Physical Therapy Treatment  Patient Details  Name: Chloe Rodgers MRN: [id number] Date of Birth: 08/10/1990 Referring Provider (PT): Carmel Sacramentoyler Terry, NP   Encounter Date: 02/21/2019  PT End of Session - 02/21/19 1246    Visit Number  3    Date for PT Re-Evaluation  04/16/19    Authorization Type  Medicaid    Authorization - Visit Number  3    Authorization - Number of Visits  10    PT Start Time  1200    PT Stop Time  1240    PT Time Calculation (min)  40 min    Activity Tolerance  Patient tolerated treatment well;No increased pain    Behavior During Therapy  WFL for tasks assessed/performed       Past Medical History:  Diagnosis Date  . Anemia   . Anxiety   . Chlamydia   . Complication of anesthesia    ??, seizure with wisdom teeth  . Depression    not on meds, sees a therapist  . Infection    UTI  . Kidney stone    kidney stones  . Migraine   . PID (acute pelvic inflammatory disease) 2014  . PONV (postoperative nausea and vomiting)   . Psoriasis   . Seizures [facility])    [place]    Past Surgical History:  Procedure Laterality Date  . CESAREAN SECTION    . CESAREAN SECTION  06/06/2012   Procedure: CESAREAN SECTION;  Surgeon: Adam PhenixJames G Arnold, MD;  Location: [facility];  Service: Obstetrics;  Laterality: N/A;  . CESAREAN SECTION N/A 03/30/2017   Procedure: REPEAT CESAREAN SECTION;  Surgeon: Lazaro ArmsEure, Luther H, MD;  [facility];  Service: Obstetrics;  Laterality: N/A;  . SKIN GRAFT     off abd, onto arm  . WISDOM TOOTH EXTRACTION      There were no vitals filed for this visit.  Subjective Assessment - 02/21/19 1206    Subjective  Stretches and medicine maintain the pain. I am wearing the girdle to see if holding my pain. toileting technique is helping patient to not strain as much.  Intensity is 30% less.    Patient Stated Goals  feel better and less pain    Currently in Pain?  Yes    Pain Score  7     Pain Location  Abdomen   pelvis and low back   Pain Descriptors / Indicators  Dull;Sharp    Pain Type  Chronic pain    Pain Onset  More than a month ago    Pain Frequency  Constant    Aggravating Factors   getting up, laying down and getting up, not taking medication    Pain Relieving Factors  exercise, medication    Multiple Pain Sites  No         [facility] PT Assessment - 02/21/19 0001      Assessment   Medical Diagnosis  R10.2 Chronic Pelvic pain in female    Referring Provider (PT)  Carmel Sacramentoyler Terry, NP    Onset Date/Surgical Date  --   chronic, 2012 estimate   Prior Therapy  yes      Precautions   Precautions  None      Restrictions   Weight Bearing Restrictions  No      Home Environment   Living Environment  Private  residence      Prior Function   Level of Independence  Independent      Cognition   Overall Cognitive Status  Within Functional Limits for tasks assessed      Posture/Postural Control   Posture/Postural Control  Postural limitations    Postural Limitations  Rounded Shoulders;Forward head      AROM   Lumbar Flexion  full    Lumbar Extension  decreased by 25%    Lumbar - Right Side Bend  full    Lumbar - Left Side Bend  full      PROM   Right Hip External Rotation   60      Strength   Right Hip Extension  4+/5    Right Hip ABduction  4+/5    Right Hip ADduction  4+/5    Left Hip Extension  4+/5    Left Hip ABduction  4+/5    Left Hip ADduction  4+/5      Palpation   SI assessment   pelvis in correct alignment                Pelvic Floor Special Questions - 02/21/19 0001    Pelvic Floor Internal Exam  Patient confirms identification and approves therapist to assess pelvic floor and treatment    Exam Type  Vaginal    Palpation  tenderness located in obturator internist, levator ani, perineal body    Strength   Flicker        [facility] Adult PT Treatment/Exercise - 02/21/19 0001      Lumbar Exercises: Stretches   Single Knee to Chest Stretch  Right;Left;2 reps;30 seconds   supine   Lower Trunk Rotation  2 reps;30 seconds   supine     Lumbar Exercises: Aerobic   Nustep  5 min. level 2 while assessing patient      Lumbar Exercises: Supine   Bridge  10 reps      Lumbar Exercises: Sidelying   Clam  Right;Left;10 reps;1 second      Lumbar Exercises: Prone   Opposite Arm/Leg Raise  Right arm/Left leg;Left arm/Right leg;10 reps;1 second      Manual Therapy   Manual Therapy  Internal Pelvic Floor    Internal Pelvic Floor  intoritus, perineal body and levator ani while montoring for pain             PT Education - 02/21/19 1243    Education Details  Access Code: [email or web address]    Person(s) Educated  Patient    Methods  Explanation;Demonstration;Verbal cues;Handout    Comprehension  Returned demonstration;Verbalized understanding       PT Short Term Goals - 02/21/19 1251      PT SHORT TERM GOAL #1   Title  ind with initial HEP    Time  4    Period  Weeks    Status  Achieved    Target Date  02/19/19      PT SHORT TERM GOAL #2   Title  pt will tolerate internal soft tissue assessment and treatment as needed    Period  Weeks    Status  Achieved    Target Date  02/19/19        PT Long Term Goals - 01/22/19 1053      PT LONG TERM GOAL #1   Title  pt will be ind with advanced HEP for pain management    Baseline  not educated yet    Time  12  Period  Weeks    Status  New    Target Date  04/16/19      PT LONG TERM GOAL #2   Title  Pt will report 50% less pain during daily activities so she can perform tasks such as meal prep without assistance    Baseline  pain level is 8/10    Time  12    Period  Weeks    Status  New    Target Date  04/16/19      PT LONG TERM GOAL #3   Title  Pt will demonstrate 4+/5 MMT on bilateral hip flexion and abduction for improved upright  posture during functional activities.    Baseline  4/5    Time  12    Period  Weeks    Status  New    Target Date  04/16/19      PT LONG TERM GOAL #4   Title  pt will be able to perfrom functional transfers without increased pain due to increased core strength.    Baseline  transfers with 8/10 pain    Time  12    Period  Weeks    Status  New    Target Date  04/16/19            Plan - 02/21/19 1236    Clinical Impression Statement  Patient reports her pain intensity has decreased by 30%. Patient has increased hip strength from 4/5 to 4+/5. Patient has improved lumbar ROM by 25%. Patient pelvis is in correct alignment now and she is wearing a corset to keep it in alignment. Right hip external rotation has increased from 45 degrees to 60 degrees. Patient is consistent with her HEP and she reports she is feeling better doing them. Patient is now able to let therapist perform interal soft tissue work now. Patient will benefit from skilled therapy to reduce her pain, improve strength and overall function.    Personal Factors and Comorbidities  Comorbidity 1;Comorbidity 2;Time since onset of injury/illness/exacerbation;Fitness    Comorbidities  acute pelvic inflammatory disease, seizures    Examination-Activity Limitations  Bed Mobility;Locomotion Level;Transfers;Continence;Toileting    Rehab Potential  Good    PT Frequency  1x / week    PT Duration  12 weeks    PT Treatment/Interventions  Biofeedback;Cryotherapy;Electrical Stimulation;Moist Heat;Ultrasound;Therapeutic exercise;Therapeutic activities;Neuromuscular re-education;Patient/family education;Passive range of motion;Manual techniques;Spinal Manipulations;Joint Manipulations    PT Next Visit Plan  go over c-section scar, pelvic floor soft tissue work, how to get out of the tub,   lumbar mobilization    PT Home Exercise Plan  [id number]    Recommended Other Services  called doctor to sign the initial summary    Consulted and Agree  with Plan of Care  Patient       Patient will benefit from skilled therapeutic intervention in order to improve the following deficits and impairments:  Decreased coordination, Decreased range of motion, Increased fascial restricitons, Decreased endurance, Increased muscle spasms, Decreased activity tolerance, Pain, Decreased scar mobility, Impaired flexibility, Decreased mobility, Decreased strength  Visit Diagnosis: 1. Muscle weakness (generalized)   2. Cramp and spasm   3. Chronic low back pain without sciatica, unspecified back pain laterality        Problem List Patient Active Problem List   Diagnosis Date Noted  . Supervision of low-risk pregnancy 11/23/2018  . Chronic PID (chronic pelvic inflammatory disease) 12/13/2017  . Status post repeat low transverse cesarean section 03/30/2017  . Pelvic inflammatory disease (PID) 02/21/2013  .  Migraines 07/24/2012  . Previous cesarean delivery, antepartum condition or complication 03/30/2012  . Obese 03/30/2012  . Seizure disorder ([facility]) 02/03/2012  . Family history of breast cancer in mother 02/03/2012    Eulis FosterCheryl Gray, PT 02/21/19 12:53 PM   [facility] [address], [address] [place], [place], [address] Phone: [phone]   Fax:  [phone]  Name: Chloe Rodgers MRN: [id number] Date of Birth: 10-Apr-1991

## 2019-02-21 NOTE — Patient Instructions (Signed)
Access Code: [email or web address]  URL: [email or web address]  Date: 02/21/2019  Prepared by: Earlie Counts   Exercises Supine Diaphragmatic Breathing - 10 reps - 1 sets - 3x daily - 7x weekly Hooklying Single Knee to Chest - 5 reps - 1 sets - 10 sec hold - 1x daily - 7x weekly Supine Lower Trunk Rotation - 10 reps - 3 sets - 1x daily - 7x weekly Supine Hamstring Stretch with Strap - 2 reps - 1 sets - 20 hold - 2x daily - 7x weekly Seated Piriformis Stretch with Trunk Bend - 2 reps - 1 sets - 30 seconds hold - 1x daily - 7x weekly Supine Caregiver Abdominal Wall 'ILU' Massage - 1 reps - 1 sets - 3 min hold - 1x daily - 7x weekly Positioning on Toilet and Defecation Technique - 1 reps - 1 sets - 1x daily - 7x weekly Seated Hamstring Stretch - 2 reps - 1 sets - 30 sec hold - 1x daily - 7x weekly Seated Hip Adductor Stretch - 2 reps - 1 sets - 30 sec hold - 1x daily - 7x weekly Supine Butterfly Groin Stretch - 2 reps - 1 sets - 30 sec hold - 1x daily - 7x weekly Quadruped Cat Camel - 10 reps - 1 sets - 1x daily - 7x weekly Child's Pose Stretch - 2 reps - 1 sets - 30 sec hold - 1x daily - 7x weekly Childs Pose Side Bend - 2 reps - 1 sets - 30 sec hold - 1x daily - 7x weekly Hooklying Isometric Hip Flexion - 10 reps - 1 sets - 5 sec hold - 1x daily - 7x weekly Supine Bridge - 10 reps - 1 sets - 1x daily - 7x weekly Clamshell - 10 reps - 1 sets - 1x daily - 7x weekly Prone Alternating Arm and Leg Lifts - 10 reps - 1 sets - 1x daily - 7x weekly Patient Education Lifting a Barrister's clerk Outpatient Rehab [address] [place], [place] [address] Phone # [phone] Fax [phone]

## 2019-02-23 ENCOUNTER — Ambulatory Visit (HOSPITAL_COMMUNITY): Payer: Medicaid Other

## 2019-02-23 ENCOUNTER — Ambulatory Visit (HOSPITAL_COMMUNITY): Payer: Medicaid Other | Attending: Obstetrics and Gynecology

## 2019-03-01 ENCOUNTER — Ambulatory Visit: Payer: Medicaid Other | Admitting: Physical Therapy

## 2019-03-01 ENCOUNTER — Other Ambulatory Visit: Payer: Self-pay

## 2019-03-01 ENCOUNTER — Encounter: Payer: Self-pay | Admitting: Physical Therapy

## 2019-03-01 DIAGNOSIS — M545 Low back pain, unspecified: Secondary | ICD-10-CM

## 2019-03-01 DIAGNOSIS — G8929 Other chronic pain: Secondary | ICD-10-CM

## 2019-03-01 DIAGNOSIS — M6281 Muscle weakness (generalized): Secondary | ICD-10-CM

## 2019-03-01 DIAGNOSIS — R252 Cramp and spasm: Secondary | ICD-10-CM

## 2019-03-01 NOTE — Therapy (Signed)
[facility] [address], [address] [place], [place], [address] Phone: [phone]   Fax:  [phone]  Physical Therapy Treatment  Patient Details  Name: Chloe Rodgers MRN: [id number] Date of Birth: June 10, 1991 Referring Provider (PT): Carmel Sacramentoyler Terry, NP   Encounter Date: 03/01/2019  PT End of Session - 03/01/19 1111    Visit Number  4    Date for PT Re-Evaluation  04/16/19    Authorization Type  Medicaid    Authorization Time Period  8/19-11/10    Authorization - Visit Number  1    Authorization - Number of Visits  12    PT Start Time  1100    PT Stop Time  1150    PT Time Calculation (min)  50 min    Activity Tolerance  Patient tolerated treatment well;No increased pain    Behavior During Therapy  WFL for tasks assessed/performed       Past Medical History:  Diagnosis Date  . Anemia   . Anxiety   . Chlamydia   . Complication of anesthesia    ??, seizure with wisdom teeth  . Depression    not on meds, sees a therapist  . Infection    UTI  . Kidney stone    kidney stones  . Migraine   . PID (acute pelvic inflammatory disease) 2014  . PONV (postoperative nausea and vomiting)   . Psoriasis   . Seizures [facility])    [place]    Past Surgical History:  Procedure Laterality Date  . CESAREAN SECTION    . CESAREAN SECTION  06/06/2012   Procedure: CESAREAN SECTION;  Surgeon: Adam PhenixJames G Arnold, MD;  Location: [facility];  Service: Obstetrics;  Laterality: N/A;  . CESAREAN SECTION N/A 03/30/2017   Procedure: REPEAT CESAREAN SECTION;  Surgeon: Lazaro ArmsEure, Luther H, MD;  [facility];  Service: Obstetrics;  Laterality: N/A;  . SKIN GRAFT     off abd, onto arm  . WISDOM TOOTH EXTRACTION      There were no vitals filed for this visit.  Subjective Assessment - 03/01/19 1105    Subjective  My back is in pain.    Patient Stated Goals  feel better and less pain    Currently in Pain?  Yes    Pain Score  6      Pain Location  Abdomen    Pain Orientation  Mid;Lower    Pain Descriptors / Indicators  Dull;Sharp    Pain Type  Chronic pain    Pain Onset  More than a month ago    Pain Frequency  Constant    Aggravating Factors   getting up, laying down and getting up, not taking medication    Pain Relieving Factors  exercise, medication    Multiple Pain Sites  Yes    Pain Score  8    Pain Location  Back    Pain Orientation  Lower    Pain Descriptors / Indicators  Sharp    Pain Type  Acute pain    Pain Onset  More than a month ago    Pain Frequency  Constant    Aggravating Factors   sitting to long, lay on stomach, getting up from a chair or bed    Pain Relieving Factors  change position                       [facility] - 03/01/19  0001      Neuro Re-ed    Neuro Re-ed Details   sitting on green physioball- bounce, pelvic sway, pelvic circles, alternate shoulder flexion, toe tapping,       Lumbar Exercises: Stretches   Other Lumbar Stretch Exercise  sit on chair and roll green physio ball back and forth,  then go to the right 5x and left 5x      Lumbar Exercises: Aerobic   Nustep  5 min. level 2 while assessing patient      Modalities   Modalities  Electrical Stimulation;Moist Heat      Moist Heat Therapy   Number Minutes Moist Heat  20 Minutes    Moist Heat Location  Lumbar Spine      Electrical Stimulation   Electrical Stimulation Location  lumbar    Electrical Stimulation Action  IFC    Electrical Stimulation Parameters  to patient tolerance, 20 min.     Electrical Stimulation Goals  Pain      Manual Therapy   Manual Therapy  Joint mobilization    Joint Mobilization  lumbar for gapping of the lumbar facets               PT Short Term Goals - 02/21/19 1251      PT SHORT TERM GOAL #1   Title  ind with initial HEP    Time  4    Period  Weeks    Status  Achieved    Target Date  02/19/19      PT SHORT TERM GOAL #2   Title   pt will tolerate internal soft tissue assessment and treatment as needed    Period  Weeks    Status  Achieved    Target Date  02/19/19        PT Long Term Goals - 01/22/19 1053      PT LONG TERM GOAL #1   Title  pt will be ind with advanced HEP for pain management    Baseline  not educated yet    Time  12    Period  Weeks    Status  New    Target Date  04/16/19      PT LONG TERM GOAL #2   Title  Pt will report 50% less pain during daily activities so she can perform tasks such as meal prep without assistance    Baseline  pain level is 8/10    Time  12    Period  Weeks    Status  New    Target Date  04/16/19      PT LONG TERM GOAL #3   Title  Pt will demonstrate 4+/5 MMT on bilateral hip flexion and abduction for improved upright posture during functional activities.    Baseline  4/5    Time  12    Period  Weeks    Status  New    Target Date  04/16/19      PT LONG TERM GOAL #4   Title  pt will be able to perfrom functional transfers without increased pain due to increased core strength.    Baseline  transfers with 8/10 pain    Time  12    Period  Weeks    Status  New    Target Date  04/16/19            Plan - 03/01/19 1112    Clinical Impression Statement  Patient has not taken her medicine today so  she is having increased pain in lumbar so todays goal was for pain reduction. Patient was able to perform exercises on the green physioball with improved lumbar mobility. Patient is working hard on her HEP. Patient will benefit from skilled therapy to reduce her pain, improve strength and overall function.    Personal Factors and Comorbidities  Comorbidity 1;Comorbidity 2;Time since onset of injury/illness/exacerbation;Fitness    Comorbidities  acute pelvic inflammatory disease, seizures    Examination-Activity Limitations  Bed Mobility;Locomotion Level;Transfers;Continence;Toileting    Examination-Participation Restrictions  Community Activity;Shop;Laundry;Meal Prep     Stability/Clinical Decision Making  Evolving/Moderate complexity    Rehab Potential  Good    PT Frequency  1x / week    PT Treatment/Interventions  Biofeedback;Cryotherapy;Electrical Stimulation;Moist Heat;Ultrasound;Therapeutic exercise;Therapeutic activities;Neuromuscular re-education;Patient/family education;Passive range of motion;Manual techniques;Spinal Manipulations;Joint Manipulations    PT Next Visit Plan  go over c-section scar, pelvic floor soft tissue work, how to get out of the [place]    Consulted and Agree with Plan of Care  Patient       Patient will benefit from skilled therapeutic intervention in order to improve the following deficits and impairments:  Decreased coordination, Decreased range of motion, Increased fascial restricitons, Decreased endurance, Increased muscle spasms, Decreased activity tolerance, Pain, Decreased scar mobility, Impaired flexibility, Decreased mobility, Decreased strength  Visit Diagnosis: Muscle weakness (generalized)  Cramp and spasm  Chronic low back pain without sciatica, unspecified back pain laterality     Problem List Patient Active Problem List   Diagnosis Date Noted  . Supervision of low-risk pregnancy 11/23/2018  . Chronic PID (chronic pelvic inflammatory disease) 12/13/2017  . Status post repeat low transverse cesarean section 03/30/2017  . Pelvic inflammatory disease (PID) 02/21/2013  . Migraines 07/24/2012  . Previous cesarean delivery, antepartum condition or complication 86/76/1950  . Obese 03/30/2012  . Seizure disorder ([place]) 02/03/2012  . Family history of breast cancer in mother 02/03/2012    Earlie Counts, PT 03/01/19 11:34 AM   [facility] [address] [place], [place], [address] Phone: [phone]   Fax:  [phone]  Name: Chloe Rodgers MRN: [id number] Date of Birth: 05-13-1991

## 2019-03-09 ENCOUNTER — Ambulatory Visit (HOSPITAL_COMMUNITY): Payer: Medicaid Other | Attending: Obstetrics and Gynecology

## 2019-03-09 ENCOUNTER — Ambulatory Visit (HOSPITAL_COMMUNITY): Payer: Medicaid Other

## 2019-03-13 ENCOUNTER — Telehealth: Payer: Self-pay | Admitting: Physical Therapy

## 2019-03-13 ENCOUNTER — Ambulatory Visit: Payer: Medicaid Other | Attending: Nurse Practitioner | Admitting: Physical Therapy

## 2019-03-13 NOTE — Telephone Encounter (Signed)
Spoke to patient and she thought her appointment was a different day. Scheduled for another appointment on 03/27/2019 at 2:00 PM.  Earlie Counts, PT @9 /07/2018@ 11:26 AM

## 2019-03-27 ENCOUNTER — Encounter: Payer: Self-pay | Admitting: Physical Therapy

## 2019-04-03 ENCOUNTER — Ambulatory Visit: Payer: Medicaid Other | Admitting: Physical Therapy

## 2019-04-18 ENCOUNTER — Ambulatory Visit: Payer: Medicaid Other | Attending: Nurse Practitioner | Admitting: Physical Therapy

## 2019-04-18 ENCOUNTER — Other Ambulatory Visit: Payer: Self-pay

## 2019-04-18 ENCOUNTER — Encounter: Payer: Self-pay | Admitting: Physical Therapy

## 2019-04-18 DIAGNOSIS — M545 Low back pain: Secondary | ICD-10-CM | POA: Diagnosis present

## 2019-04-18 DIAGNOSIS — M6281 Muscle weakness (generalized): Secondary | ICD-10-CM | POA: Diagnosis not present

## 2019-04-18 DIAGNOSIS — R252 Cramp and spasm: Secondary | ICD-10-CM | POA: Insufficient documentation

## 2019-04-18 DIAGNOSIS — G8929 Other chronic pain: Secondary | ICD-10-CM | POA: Insufficient documentation
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
## 2019-05-01 ENCOUNTER — Ambulatory Visit: Payer: Medicaid Other | Admitting: Physical Therapy

## 2019-05-08 ENCOUNTER — Ambulatory Visit: Payer: Medicaid Other | Admitting: Physical Therapy

## 2019-05-08 ENCOUNTER — Telehealth: Payer: Self-pay | Admitting: Physical Therapy

## 2019-05-08 NOTE — Telephone Encounter (Signed)
Called patient about her appointment at 11:45. Patient reported she forgot about her appointment. Patient was reminded of her next appointment on 11/3 at 11:45 and she said she will be there. Physical therapist made patient aware of our cancellation policy.  Earlie Counts, PT @10 /27/2020@ 12:19 PM

## 2019-05-15 ENCOUNTER — Encounter: Payer: Medicaid Other | Admitting: Physical Therapy

## 2019-05-15 ENCOUNTER — Ambulatory Visit: Payer: Medicaid Other | Admitting: Physical Therapy

## 2019-05-22 ENCOUNTER — Ambulatory Visit: Payer: Self-pay | Admitting: Physical Therapy

## 2019-06-13 ENCOUNTER — Encounter: Payer: Self-pay | Admitting: Physical Therapy

## 2019-06-13 ENCOUNTER — Ambulatory Visit: Payer: Medicaid Other | Attending: Nurse Practitioner | Admitting: Physical Therapy

## 2019-06-13 ENCOUNTER — Other Ambulatory Visit: Payer: Self-pay

## 2019-06-13 DIAGNOSIS — M545 Low back pain, unspecified: Secondary | ICD-10-CM

## 2019-06-13 DIAGNOSIS — G8929 Other chronic pain: Secondary | ICD-10-CM | POA: Insufficient documentation

## 2019-06-13 DIAGNOSIS — M6281 Muscle weakness (generalized): Secondary | ICD-10-CM | POA: Diagnosis not present

## 2019-06-13 DIAGNOSIS — R252 Cramp and spasm: Secondary | ICD-10-CM | POA: Insufficient documentation

## 2019-06-13 NOTE — Therapy (Signed)
[facility] [address], [address] [place], [place], [address] Phone: [phone]   Fax:  [phone]  Physical Therapy Treatment  Patient Details  Name: Chloe ParrShakilla Rodgers MRN: [id number] Date of Birth: [birth year] Referring Provider (PT): Carmel Sacramentoyler Terry, NP   Encounter Date: 06/13/2019  PT End of Session - 06/13/19 1623    Visit Number  6    Date for PT Re-Evaluation  07/11/19    Authorization Type  Medicaid    Authorization Time Period  06/04/2019-07/08/2019    Authorization - Visit Number  1    Authorization - Number of Visits  5    PT Start Time  1615    PT Stop Time  1655    PT Time Calculation (min)  40 min    Activity Tolerance  Patient tolerated treatment well;No increased pain    Behavior During Therapy  WFL for tasks assessed/performed       Past Medical History:  Diagnosis Date  . Anemia   . Anxiety   . Chlamydia   . Complication of anesthesia    ??, seizure with wisdom teeth  . Depression    not on meds, sees a therapist  . Infection    UTI  . Kidney stone    kidney stones  . Migraine   . PID (acute pelvic inflammatory disease) 2014  . PONV (postoperative nausea and vomiting)   . Psoriasis   . Se[place]ures [facility](HCC)    [place]    Past Surgical History:  Procedure Laterality Date  . CESAREAN SECTION    . CESAREAN SECTION  06/06/2012   Procedure: CESAREAN SECTION;  Surgeon: Adam PhenixJames G Arnold, MD;  Location: [facility];  Service: Obstetrics;  Laterality: N/A;  . CESAREAN SECTION N/A 03/30/2017   Procedure: REPEAT CESAREAN SECTION;  Surgeon: Lazaro ArmsEure, Luther H, MD;  [facility];  Service: Obstetrics;  Laterality: N/A;  . SKIN GRAFT     off abd, onto arm  . WISDOM TOOTH EXTRACTION      There were no vitals filed for this visit.  Subjective Assessment - 06/13/19 1619    Subjective  My back hurts bad today.I have been doing the exercises since last visit.    Patient Stated Goals  feel  better and less pain    Currently in Pain?  Yes    Pain Score  7     Pain Location  Abdomen    Pain Orientation  Mid;Lower    Pain Descriptors / Indicators  Sharp    Pain Type  Chronic pain    Pain Onset  More than a month ago    Pain Frequency  Constant    Aggravating Factors   laying down and have to get up, bend too much, standing    Pain Relieving Factors  exercise, medication    Multiple Pain Sites  Yes    Pain Score  9    Pain Location  Back    Pain Orientation  Lower    Pain Descriptors / Indicators  Sharp    Pain Type  Acute pain    Pain Onset  More than a month ago    Pain Frequency  Constant    Aggravating Factors   too much activity, laying and going into standing    Pain Relieving Factors  change positions         [facility] - 06/13/19 0001      Assessment   Medical Diagnosis  R10.2 Chronic Pelvic pain in female    Referring Provider (PT)  Emelia Loron, NP    Onset Date/Surgical Date  --   chronic, 2012 estimate   Prior Therapy  yes      Precautions   Precautions  None      Restrictions   Weight Bearing Restrictions  No      Home Environment   Living Environment  Private residence      Prior Function   Level of Independence  Independent      Cognition   Overall Cognitive Status  Within Functional Limits for tasks assessed      AROM   Lumbar Flexion  full    Lumbar Extension  full    Lumbar - Right Side Bend  full    Lumbar - Left Side Bend  ful    Lumbar - Right Rotation  full    Lumbar - Left Rotation  full      Strength   Right Hip Extension  5/5    Right Hip ABduction  4+/5    Right Hip ADduction  5/5    Left Hip Extension  4+/5    Left Hip ABduction  4+/5    Left Hip ADduction  5/5      Palpation   SI assessment   pelvis in correct alignment                Pelvic Floor Special Questions - 06/13/19 0001    Pelvic Floor Internal Exam  Patient confirms identification and approves therapist to assess pelvic floor and  treatment    Exam Type  Vaginal    Strength  fair squeeze, definite lift   started as 2/5 but after soft tissue work is 3/5   Strength # of seconds  10        [facility] Adult PT Treatment/Exercise - 06/13/19 0001      Lumbar Exercises: Stretches   Active Hamstring Stretch  Right;Left;1 rep;30 seconds   with strap   Single Knee to Chest Stretch  Right;Left;1 rep;30 seconds    ITB Stretch  Right;Left;1 rep;30 seconds   with strap     Manual Therapy   Manual Therapy  Soft tissue mobilization;Internal Pelvic Floor    Soft tissue mobilization  using the addaday to massage the lumbar paraspinals, gluteals and hamstring    Internal Pelvic Floor  bil. levator ani, perineal body, introitus, released the fascial releases at the perineal body while monitoring her pain level due to going form 7/10 to 2/10.                PT Short Term Goals - 02/21/19 1251      PT SHORT TERM GOAL #1   Title  ind with initial HEP    Time  4    Period  Weeks    Status  Achieved    Target Date  02/19/19      PT SHORT TERM GOAL #2   Title  pt will tolerate internal soft tissue assessment and treatment as needed    Period  Weeks    Status  Achieved    Target Date  02/19/19        PT Long Term Goals - 06/13/19 1656      PT LONG TERM GOAL #1   Title  pt will be ind with advanced HEP for pain management    Time  12    Period  Weeks    Status  On-going      PT LONG TERM GOAL #2   Title  Pt will report 50% less pain during daily activities so she can perform tasks such as meal prep without assistance    Baseline  pain level is 9/10    Time  12    Period  Weeks    Status  On-going      PT LONG TERM GOAL #3   Title  Pt will demonstrate 4+/5 MMT on bilateral hip flexion and abduction for improved upright posture during functional activities.    Time  12    Period  Weeks    Status  Achieved      PT LONG TERM GOAL #4   Title  pt will be able to perfrom functional transfers without increased  pain due to increased core strength.    Baseline  pain level 9/10    Time  12    Period  Weeks    Status  On-going            Plan - 06/13/19 1657    Clinical Impression Statement  Patient reports she will leak urine during the day with any activity. Patient continues to have a high pain level in pelvic floor and back. Patient will wear a panty liner during the day due to leakage. Patient lumbar ROM is full. Patient has increased strength in hips at 4+/5 to 5/5. Pelvic floor strength went from 2/5 to 3/5 holding for 10 seconds after manual work. Patient has tenderness located in levator ani and obturator internist and perineal body. Patient will benefit from skilled therapy to improve her strength and function while reducing her pain.    Personal Factors and Comorbidities  Comorbidity 1;Comorbidity 2;Time since onset of injury/illness/exacerbation;Fitness    Comorbidities  acute pelvic inflammatory disease, seizures    Examination-Activity Limitations  Bed Mobility;Locomotion Level;Transfers;Continence;Toileting    Examination-Participation Restrictions  Community Activity;Shop;Laundry;Meal Prep    PT Frequency  1x / week    PT Duration  4 weeks    PT Treatment/Interventions  Biofeedback;Cryotherapy;Electrical Stimulation;Moist Heat;Ultrasound;Therapeutic exercise;Therapeutic activities;Neuromuscular re-education;Patient/family education;Passive range of motion;Manual techniques;Spinal Manipulations;Joint Manipulations    PT Next Visit Plan  go over c-section scar, pelvic floor soft tissue work , how to get out of the tub    PT Home Exercise Plan  [email or web address]    Recommended Other Services  went another renewal on 06/13/2019    Consulted and Agree with Plan of Care  Patient       Patient will benefit from skilled therapeutic intervention in order to improve the following deficits and impairments:  Decreased coordination, Decreased range of motion, Increased fascial restricitons, Decreased  endurance, Increased muscle spasms, Decreased activity tolerance, Pain, Decreased scar mobility, Impaired flexibility, Decreased mobility, Decreased strength  Visit Diagnosis: Muscle weakness (generalized) - Plan: PT plan of care cert/re-cert  Cramp and spasm - Plan: PT plan of care cert/re-cert  Chronic low back pain without sciatica, unspecified back pain laterality - Plan: PT plan of care cert/re-cert     Problem List Patient Active Problem List   Diagnosis Date Noted  . Supervision of low-risk pregnancy 11/23/2018  . Chronic PID (chronic pelvic inflammatory disease) 12/13/2017  . Status post repeat low transverse cesarean section 03/30/2017  . Pelvic inflammatory disease (PID) 02/21/2013  . Migraines 07/24/2012  . Previous cesarean delivery, antepartum condition or complication 03/30/2012  . Obese 03/30/2012  . Seizure disorder (HCC) 02/03/2012  . Family history of breast cancer in mother 02/03/2012  Eulis Foster, PT 06/13/19 5:05 PM   [facility] [address], [address] [place], [place], [address] Phone: [phone]   Fax:  [phone]  Name: Jocelin Schuelke MRN: [id number] Date of Birth: 01/24/1991

## 2019-06-27 ENCOUNTER — Encounter: Payer: Self-pay | Admitting: Physical Therapy

## 2019-06-27 ENCOUNTER — Other Ambulatory Visit: Payer: Self-pay

## 2019-06-27 ENCOUNTER — Ambulatory Visit: Payer: Medicaid Other | Admitting: Physical Therapy

## 2019-06-27 DIAGNOSIS — G8929 Other chronic pain: Secondary | ICD-10-CM

## 2019-06-27 DIAGNOSIS — R252 Cramp and spasm: Secondary | ICD-10-CM

## 2019-06-27 DIAGNOSIS — M545 Low back pain, unspecified: Secondary | ICD-10-CM

## 2019-06-27 DIAGNOSIS — M6281 Muscle weakness (generalized): Secondary | ICD-10-CM

## 2019-06-27 NOTE — Patient Instructions (Signed)
Access Code: [email or web address]  URL: [email or web address]  Date: 06/27/2019  Prepared by: Earlie Counts   Exercises Supine Diaphragmatic Breathing - 10 reps - 1 sets - 3x daily - 7x weekly Hooklying Single Knee to Chest - 5 reps - 1 sets - 10 sec hold - 1x daily - 7x weekly Supine Lower Trunk Rotation - 10 reps - 3 sets - 1x daily - 7x weekly Supine Hamstring Stretch with Strap - 2 reps - 1 sets - 20 hold - 2x daily - 7x weekly Seated Piriformis Stretch with Trunk Bend - 2 reps - 1 sets - 30 seconds hold - 1x daily - 7x weekly Supine Caregiver Abdominal Wall 'ILU' Massage - 1 reps - 1 sets - 3 min hold - 1x daily - 7x weekly Positioning on Toilet and Defecation Technique - 1 reps - 1 sets - 1x daily - 7x weekly Seated Hamstring Stretch - 2 reps - 1 sets - 30 sec hold - 1x daily - 7x weekly Seated Hip Adductor Stretch - 2 reps - 1 sets - 30 sec hold - 1x daily - 7x weekly Supine Butterfly Groin Stretch - 2 reps - 1 sets - 30 sec hold - 1x daily - 7x weekly Quadruped Cat Camel - 10 reps - 1 sets - 1x daily - 7x weekly Child's Pose Stretch - 2 reps - 1 sets - 30 sec hold - 1x daily - 7x weekly Childs Pose Side Bend - 2 reps - 1 sets - 30 sec hold - 1x daily - 7x weekly Hooklying Isometric Hip Flexion - 10 reps - 1 sets - 5 sec hold - 1x daily - 7x weekly Supine Bridge - 10 reps - 1 sets - 1x daily - 7x weekly Clamshell - 10 reps - 1 sets - 1x daily - 7x weekly Prone Alternating Arm and Leg Lifts - 10 reps - 1 sets - 1x daily - 7x weekly Supine Double Knee to Chest - 2 reps - 1 sets - 15 sec hold - 1x daily - 7x weekly Patient Education Lifting a Engineer, building services [facility] [address] [place], [place] [address] Phone # [phone] Fax [phone]

## 2019-06-27 NOTE — Therapy (Signed)
[facility] [address], [place], [place], [address] Phone: [phone]   Fax:  [phone]  Physical Therapy Treatment  Patient Details  Name: Chloe Rodgers MRN: [id number] Date of Birth: 12/19/1990 Referring Provider (PT): Emelia Loron, NP   Encounter Date: 06/27/2019  PT End of Session - 06/27/19 1020    Visit Number  7    Date for PT Re-Evaluation  10/03/19    Authorization Type  Medicaid    Authorization Time Period  06/04/2019-07/08/2019    Authorization - Visit Number  2    Authorization - Number of Visits  5    PT Start Time  6789    PT Stop Time  1055    PT Time Calculation (min)  40 min    Activity Tolerance  Patient tolerated treatment well;No increased pain    Behavior During Therapy  WFL for tasks assessed/performed       Past Medical History:  Diagnosis Date  . Anemia   . Anxiety   . Chlamydia   . Complication of anesthesia    ??, seizure with wisdom teeth  . Depression    not on meds, sees a therapist  . Infection    UTI  . Kidney stone    kidney stones  . Migraine   . PID (acute pelvic inflammatory disease) 2014  . PONV (postoperative nausea and vomiting)   . Psoriasis   . Seizures [facility])    July 2013 - [place]    Past Surgical History:  Procedure Laterality Date  . CESAREAN SECTION    . CESAREAN SECTION  06/06/2012   Procedure: CESAREAN SECTION;  Surgeon: Woodroe Mode, MD;  Location: [facility];  Service: Obstetrics;  Laterality: N/A;  . CESAREAN SECTION N/A 03/30/2017   Procedure: REPEAT CESAREAN SECTION;  Surgeon: Florian Buff, MD;  Location: [facility];  Service: Obstetrics;  Laterality: N/A;  . SKIN GRAFT     off abd, onto arm  . WISDOM TOOTH EXTRACTION      There were no vitals filed for this visit.  Subjective Assessment - 06/27/19 1021    Subjective  My back hurts. When my pelvic hurts my back hurts. I have another UTI. The MD wants to know why I have a  recurring UTI. I feel like I have the PID. I have burning but not when I pee. I am having bleeding and discharg from the vaginal area.    Patient Stated Goals  feel better and less pain    Currently in Pain?  Yes    Pain Score  7     Pain Location  Pelvis    Pain Orientation  Mid;Lower    Pain Descriptors / Indicators  Burning;Sharp;Cramping    Pain Type  Acute pain    Pain Onset  1 to 4 weeks ago    Pain Frequency  Constant    Aggravating Factors   sitting too long, vaginal penetration, standing up and moving    Pain Relieving Factors  rest    Multiple Pain Sites  Yes    Pain Score  8    Pain Location  Back    Pain Orientation  Lower    Pain Descriptors / Indicators  Dull;Constant    Pain Type  Acute pain    Pain Onset  More than a month ago    Pain Frequency  Constant    Aggravating Factors   standing, sitting, too much activity  Pain Relieving Factors  rest         [facility] PT Assessment - 06/27/19 0001      Assessment   Medical Diagnosis  R10.2 Chronic Pelvic pain in female    Referring Provider (PT)  Carmel Sacramento, NP    Onset Date/Surgical Date  --   chronic, 2012 estimate   Prior Therapy  yes      Precautions   Precautions  None      Restrictions   Weight Bearing Restrictions  No      Home Environment   Living Environment  Private residence      Prior Function   Level of Independence  Independent      Cognition   Overall Cognitive Status  Within Functional Limits for tasks assessed      AROM   Lumbar Flexion  full    Lumbar Extension  full    Lumbar - Right Side Bend  decreased by 25%    Lumbar - Left Side Bend  decreased by 25%    Lumbar - Right Rotation  full    Lumbar - Left Rotation  full      Strength   Overall Strength Comments  abdominal strength 2/5    Right Hip Extension  5/5    Right Hip ABduction  5/5    Right Hip ADduction  5/5    Left Hip Extension  4/5    Left Hip ABduction  5/5    Left Hip ADduction  5/5      Palpation   SI  assessment   right ilium anterior rotated      Special Tests   Sacroiliac Tests   Pelvic Compression      Pelvic Compression   Findings  Positive    Side  Left    comment  pain                Pelvic Floor Special Questions - 06/27/19 0001    Urinary Leakage  Yes    Pad use  3, at night will use a tampon due to bleeding and liquid coming out of the vagina    Activities that cause leaking  Other    Other activities that cause leaking  all activities    Fecal incontinence  No    Exam Type  Deferred   has a UTI       [facility] Adult PT Treatment/Exercise - 06/27/19 0001      Lumbar Exercises: Stretches   Active Hamstring Stretch  Right;Left;1 rep;30 seconds   sitting   Passive Hamstring Stretch  Right;Left;1 rep;30 seconds    Passive Hamstring Stretch Limitations  supine, therapist stretche    Lower Trunk Rotation  2 reps;30 seconds    Lower Trunk Rotation Limitations  each side    Piriformis Stretch  Right;Left;30 seconds;1 rep    Piriformis Stretch Limitations  supine ; 1 time each leg in sitting    Other Lumbar Stretch Exercise  butterfly stretch hold for 1 minute    Other Lumbar Stretch Exercise  sitting hip adductor stretch holding ofr 30 seconds each leg      Lumbar Exercises: Aerobic   Stationary Bike  7 min level 2 while assessing patient      Lumbar Exercises: Supine   Bridge  10 reps    Isometric Hip Flexion Limitations  tried 1 time but increased abdominal pain    Other Supine Lumbar Exercises  diaphragmatic breathing in butterfly position and relax the  pelvis      Manual Therapy   Manual Therapy  Muscle Energy Technique    Muscle Energy Technique  correct left ilium and afterwards ASIS were equal             PT Education - 06/27/19 1057    Education Details  Access Code: [email or web address]    Person(s) Educated  Patient    Methods  Explanation;Demonstration;Handout;Verbal cues    Comprehension  Verbalized understanding;Returned demonstration        PT Short Term Goals - 02/21/19 1251      PT SHORT TERM GOAL #1   Title  ind with initial HEP    Time  4    Period  Weeks    Status  Achieved    Target Date  02/19/19      PT SHORT TERM GOAL #2   Title  pt will tolerate internal soft tissue assessment and treatment as needed    Period  Weeks    Status  Achieved    Target Date  02/19/19        PT Long Term Goals - 06/27/19 1058      PT LONG TERM GOAL #1   Title  pt will be ind with advanced HEP for pain management    Baseline  continues to have further education as she progresses    Time  12    Period  Weeks    Status  On-going      PT LONG TERM GOAL #2   Title  Pt will report 50% less pain during daily activities so she can perform tasks such as meal prep without assistance    Baseline  pain level is 7-8/10 due to increased pain from a UTI and possible PID, seeing doctor today    Time  12    Period  Weeks    Status  On-going      PT LONG TERM GOAL #3   Title  Pt will demonstrate 4+/5 MMT on bilateral hip flexion and abduction for improved upright posture during functional activities.    Time  12    Period  Weeks    Status  Achieved      PT LONG TERM GOAL #4   Title  pt will be able to perfrom functional transfers without increased pain due to increased core strength.    Baseline  pain level 7-8/10 due to UTI and possible PID    Time  12    Period  Weeks    Status  On-going            Plan - 06/27/19 1039    Clinical Impression Statement  Patient says her back pain is related to her pelvic pain. Patient pelvic pain is 8/10 due to UTI and possible Pelvic Inflammatory Disease. Patient is going to the doctor today for further evaluation. Back pain is 7/10. Patient pain is worse with movement, sitting and standing too long. Patient is wearing 3 pads per day and 1 tampon during the night. Patient reports the tampon has yellow and sometimes blood on it. Patient has increased strength of hips except with weakness  in left hip extension at 4/5. Pelvic floor strength is 3/5 holding for 10 seconds after manual work. Patient has tenderness located in levatoa ani, obturator internist, and perineal body. Patient is living in a shelter and will be moving to a home on 07/02/2019. Patient will benefit from skilled therapy to improve her strength and function while reducing her  pain.    Personal Factors and Comorbidities  Comorbidity 1;Comorbidity 2;Time since onset of injury/illness/exacerbation;Fitness    Comorbidities  acute pelvic inflammatory disease, seizures    Examination-Activity Limitations  Bed Mobility;Locomotion Level;Transfers;Continence;Toileting    Examination-Participation Restrictions  Community Activity;Shop;Laundry;Meal Prep    Stability/Clinical Decision Making  Evolving/Moderate complexity    Rehab Potential  Good    PT Frequency  1x / week    PT Duration  8 weeks    PT Treatment/Interventions  Biofeedback;Cryotherapy;Electrical Stimulation;Moist Heat;Ultrasound;Therapeutic exercise;Therapeutic activities;Neuromuscular re-education;Patient/family education;Passive range of motion;Manual techniques;Spinal Manipulations;Joint Manipulations    PT Next Visit Plan  go over c-section scar, pelvic floor soft tissue work , how to get out of the tub    PT Home Exercise Plan  Access Code: [email or web address]    Recommended Other Services  sent renewal on 06/27/2019 and put in for medicaid extension    Consulted and Agree with Plan of Care  Patient       Patient will benefit from skilled therapeutic intervention in order to improve the following deficits and impairments:  Decreased coordination, Decreased range of motion, Increased fascial restricitons, Decreased endurance, Increased muscle spasms, Decreased activity tolerance, Pain, Decreased scar mobility, Impaired flexibility, Decreased mobility, Decreased strength  Visit Diagnosis: Muscle weakness (generalized) - Plan: PT plan of care cert/re-cert  Cramp and  spasm - Plan: PT plan of care cert/re-cert  Chronic low back pain without sciatica, unspecified back pain laterality - Plan: PT plan of care cert/re-cert     Problem List Patient Active Problem List   Diagnosis Date Noted  . Supervision of low-risk pregnancy 11/23/2018  . Chronic PID (chronic pelvic inflammatory disease) 12/13/2017  . Status post repeat low transverse cesarean section 03/30/2017  . Pelvic inflammatory disease (PID) 02/21/2013  . Migraines 07/24/2012  . Previous cesarean delivery, antepartum condition or complication 03/30/2012  . Obese 03/30/2012  . Seizure disorder (HCC) 02/03/2012  . Family history of breast cancer in mother 02/03/2012    Eulis Foster, PT 06/27/19 11:13 AM   [facility] [address], [address] [place], [place], [address] Phone: [phone]   Fax:  [phone]  Name: Jamerica Snavely MRN: [id number] Date of Birth: [birth year]

## 2019-07-17 ENCOUNTER — Other Ambulatory Visit: Payer: Self-pay

## 2019-07-17 ENCOUNTER — Ambulatory Visit: Payer: Medicaid Other | Attending: Nurse Practitioner | Admitting: Physical Therapy

## 2019-07-17 DIAGNOSIS — M545 Low back pain: Secondary | ICD-10-CM | POA: Insufficient documentation

## 2019-07-17 DIAGNOSIS — M6281 Muscle weakness (generalized): Secondary | ICD-10-CM | POA: Insufficient documentation

## 2019-07-17 DIAGNOSIS — R252 Cramp and spasm: Secondary | ICD-10-CM

## 2019-07-17 DIAGNOSIS — G8929 Other chronic pain: Secondary | ICD-10-CM | POA: Insufficient documentation

## 2019-07-17 NOTE — Therapy (Signed)
[facility] [address], [place], [place], [address] Phone: [phone]   Fax:  [phone]  Physical Therapy Treatment  Patient Details  Name: Chloe Rodgers MRN: [id number] Date of Birth: 1991/04/21 Referring Provider (PT): Emelia Loron, NP   Encounter Date: 07/17/2019  PT End of Session - 07/17/19 1615    Visit Number  8    Date for PT Re-Evaluation  10/03/19    Authorization Type  Medicaid    Authorization Time Period  07/14/2019-08/06/2019    Authorization - Visit Number  1    Authorization - Number of Visits  3    PT Start Time  1245    PT Stop Time  1610    PT Time Calculation (min)  40 min    Activity Tolerance  Patient tolerated treatment well;No increased pain    Behavior During Therapy  WFL for tasks assessed/performed       Past Medical History:  Diagnosis Date  . Anemia   . Anxiety   . Chlamydia   . Complication of anesthesia    ??, seizure with wisdom teeth  . Depression    not on meds, sees a therapist  . Infection    UTI  . Kidney stone    kidney stones  . Migraine   . PID (acute pelvic inflammatory disease) 2014  . PONV (postoperative nausea and vomiting)   . Psoriasis   . Seizures [facility])    July 2013 - [place]    Past Surgical History:  Procedure Laterality Date  . CESAREAN SECTION    . CESAREAN SECTION  06/06/2012   Procedure: CESAREAN SECTION;  Surgeon: Woodroe Mode, MD;  Location: [facility];  Service: Obstetrics;  Laterality: N/A;  . CESAREAN SECTION N/A 03/30/2017   Procedure: REPEAT CESAREAN SECTION;  Surgeon: Florian Buff, MD;  Location: [facility];  Service: Obstetrics;  Laterality: N/A;  . SKIN GRAFT     off abd, onto arm  . WISDOM TOOTH EXTRACTION      There were no vitals filed for this visit.  Subjective Assessment - 07/17/19 1537    Subjective  My back feels okay. I still have trouble getting in and out of the tub. I will be seeing a specialist for the  UTI. I think it is this month. I have my cycle now and using 3-4 tampons per day and hurts to use a tampon. Massaging the c-section scar helps.    Patient Stated Goals  feel better and less pain    Currently in Pain?  Yes    Pain Score  9     Pain Location  Pelvis    Pain Orientation  Mid;Lower    Pain Descriptors / Indicators  Sharp;Burning;Cramping    Pain Type  Chronic pain    Pain Onset  1 to 4 weeks ago    Pain Frequency  Constant    Aggravating Factors   sitting too long, standing up and moving, vaginal penetration    Pain Relieving Factors  rest    Multiple Pain Sites  Yes    Pain Score  6    Pain Location  Back    Pain Orientation  Lower    Pain Descriptors / Indicators  Dull;Constant    Pain Type  Acute pain    Pain Onset  More than a month ago    Pain Frequency  Constant    Aggravating Factors   sitting or standing too long  Pain Relieving Factors  rest                       [facility] Adult PT Treatment/Exercise - 07/17/19 0001      Therapeutic Activites    Therapeutic Activities  ADL's    ADL's  rolling side to side as a long to move in bed but protect her back and engage the core; supine to sitting with log roll engaging the core; sit to stand engaging the core and keeping spinal neutral to decrease strain on the back and pelvic floor      Lumbar Exercises: Stretches   Double Knee to Chest Stretch  1 rep;30 seconds    Double Knee to Chest Stretch Limitations  knees apart    Piriformis Stretch  Right;Left;30 seconds;1 rep    Piriformis Stretch Limitations  sitting    Other Lumbar Stretch Exercise  butterfly stretch hold for 1 minute while doing bulging of the pelvic floor      Lumbar Exercises: Aerobic   Stationary Bike  5 min level 2 while assessing patient      Lumbar Exercises: Standing   Other Standing Lumbar Exercises  modified dead lift with 5#, VC to engage the deep core and keep spinal neutral      Lumbar Exercises: Supine   Bridge  15 reps     Other Supine Lumbar Exercises  supine trunk twist to work the obliques and prepare ofr bed mobility             PT Education - 07/17/19 1615    Education Details  Access Code: [email or web address]    Person(s) Educated  Patient    Methods  Explanation;Demonstration;Handout    Comprehension  Verbalized understanding;Returned demonstration       PT Short Term Goals - 02/21/19 1251      PT SHORT TERM GOAL #1   Title  ind with initial HEP    Time  4    Period  Weeks    Status  Achieved    Target Date  02/19/19      PT SHORT TERM GOAL #2   Title  pt will tolerate internal soft tissue assessment and treatment as needed    Period  Weeks    Status  Achieved    Target Date  02/19/19        PT Long Term Goals - 07/17/19 1551      PT LONG TERM GOAL #1   Title  pt will be ind with advanced HEP for pain management    Baseline  continues to have further education as she progresses    Time  12    Period  Weeks    Status  On-going      PT LONG TERM GOAL #2   Title  Pt will report 50% less pain during daily activities so she can perform tasks such as meal prep without assistance    Baseline  pain is 9/10 due to not taking her pain management medication and being on her cycle    Time  12    Period  Weeks    Status  On-going      PT LONG TERM GOAL #4   Title  pt will be able to perfrom functional transfers without increased pain due to increased core strength.    Baseline  working on bed mobility    Time  12    Period  Weeks    Status  On-going  Plan - 07/17/19 1557    Clinical Impression Statement  Patient did not take her pain management medication and is having more pain due to that and being on her cycle. Patient has learned today how to use daily movements as an exercise to strengthen her core and pelvic floor. After therapy she reports her pain level decreased and was feeling better. Patient will be seeing a pelvic floor specialist due to having frequent UTI's.  Patient cycle is very heavy right now. Patient wears tampons but it is painful. Patient is doing her c-section scar massage at home daily. Patient appears to have lost weight. Patient will benefit from skilled therapy to manage her pain while working on her strength.    Personal Factors and Comorbidities  Comorbidity 1;Comorbidity 2;Time since onset of injury/illness/exacerbation;Fitness    Comorbidities  acute pelvic inflammatory disease, seizures    Examination-Activity Limitations  Bed Mobility;Locomotion Level;Transfers;Continence;Toileting    Examination-Participation Restrictions  Community Activity;Shop;Laundry;Meal Prep    Stability/Clinical Decision Making  Evolving/Moderate complexity    Rehab Potential  Good    PT Frequency  1x / week    PT Duration  8 weeks    PT Treatment/Interventions  Biofeedback;Cryotherapy;Electrical Stimulation;Moist Heat;Ultrasound;Therapeutic exercise;Therapeutic activities;Neuromuscular re-education;Patient/family education;Passive range of motion;Manual techniques;Spinal Manipulations;Joint Manipulations    PT Next Visit Plan  continue to work on core and pelvic floor strength with daily activities    PT Home Exercise Plan  Access Code: [email or web address]    Recommended Other Services  MD signed all notes    Consulted and Agree with Plan of Care  Patient       Patient will benefit from skilled therapeutic intervention in order to improve the following deficits and impairments:  Decreased coordination, Decreased range of motion, Increased fascial restricitons, Decreased endurance, Increased muscle spasms, Decreased activity tolerance, Pain, Decreased scar mobility, Impaired flexibility, Decreased mobility, Decreased strength  Visit Diagnosis: Muscle weakness (generalized)  Cramp and spasm  Chronic low back pain without sciatica, unspecified back pain laterality     Problem List Patient Active Problem List   Diagnosis Date Noted  . Supervision of low-risk  pregnancy 11/23/2018  . Chronic PID (chronic pelvic inflammatory disease) 12/13/2017  . Status post repeat low transverse cesarean section 03/30/2017  . Pelvic inflammatory disease (PID) 02/21/2013  . Migraines 07/24/2012  . Previous cesarean delivery, antepartum condition or complication 03/30/2012  . Obese 03/30/2012  . Seizure disorder (HCC) 02/03/2012  . Family history of breast cancer in mother 02/03/2012    Eulis Foster, PT 07/17/19 4:24 PM   [facility] [address] [place], [place], [address] Phone: [phone]   Fax:  [phone]  Name: Chloe Rodgers MRN: [id number] Date of Birth: June 10, 1991

## 2019-07-17 NOTE — Patient Instructions (Signed)
Access Code: [email or web address]  URL: [email or web address]  Date: 07/17/2019  Prepared by: Eulis Foster   Exercises Supine Diaphragmatic Breathing - 10 reps - 1 sets - 3x daily - 7x weekly Hooklying Single Knee to Chest - 5 reps - 1 sets - 10 sec hold - 1x daily - 7x weekly Supine Lower Trunk Rotation - 10 reps - 3 sets - 1x daily - 7x weekly Supine Hamstring Stretch with Strap - 2 reps - 1 sets - 20 hold - 2x daily - 7x weekly Seated Piriformis Stretch with Trunk Bend - 2 reps - 1 sets - 30 seconds hold - 1x daily - 7x weekly Supine Caregiver Abdominal Wall 'ILU' Massage - 1 reps - 1 sets - 3 min hold - 1x daily - 7x weekly Positioning on Toilet and Defecation Technique - 1 reps - 1 sets - 1x daily - 7x weekly Seated Hamstring Stretch - 2 reps - 1 sets - 30 sec hold - 1x daily - 7x weekly Seated Hip Adductor Stretch - 2 reps - 1 sets - 30 sec hold - 1x daily - 7x weekly Supine Butterfly Groin Stretch - 2 reps - 1 sets - 30 sec hold - 1x daily - 7x weekly Quadruped Cat Camel - 10 reps - 1 sets - 1x daily - 7x weekly Child's Pose Stretch - 2 reps - 1 sets - 30 sec hold - 1x daily - 7x weekly Childs Pose Side Bend - 2 reps - 1 sets - 30 sec hold - 1x daily - 7x weekly Hooklying Isometric Hip Flexion - 10 reps - 1 sets - 5 sec hold - 1x daily - 7x weekly Supine Bridge - 10 reps - 1 sets - 1x daily - 7x weekly Clamshell - 10 reps - 1 sets - 1x daily - 7x weekly Prone Alternating Arm and Leg Lifts - 10 reps - 1 sets - 1x daily - 7x weekly Supine Double Knee to Chest - 2 reps - 1 sets - 15 sec hold - 1x daily - 7x weekly Sitting to Supine Roll - 10 reps - 3 sets - 1x daily - 7x weekly Sit to Stand with Pelvic Floor Contraction - 5 reps - 1 sets - 1x daily - 7x weekly Patient Education Lifting a Facilities manager [facility] [address] [place], [place] [address] Phone # [phone] Fax [phone]

## 2019-07-25 ENCOUNTER — Ambulatory Visit: Payer: Medicaid Other | Admitting: Physical Therapy

## 2019-07-31 ENCOUNTER — Ambulatory Visit: Payer: Medicaid Other | Admitting: Physical Therapy

## 2019-08-06 ENCOUNTER — Ambulatory Visit: Payer: Medicaid Other | Admitting: Physical Therapy

## 2019-08-06 ENCOUNTER — Other Ambulatory Visit: Payer: Self-pay

## 2019-08-06 ENCOUNTER — Encounter: Payer: Self-pay | Admitting: Physical Therapy

## 2019-08-06 DIAGNOSIS — R252 Cramp and spasm: Secondary | ICD-10-CM

## 2019-08-06 DIAGNOSIS — G8929 Other chronic pain: Secondary | ICD-10-CM

## 2019-08-06 DIAGNOSIS — M6281 Muscle weakness (generalized): Secondary | ICD-10-CM

## 2019-08-06 DIAGNOSIS — M545 Low back pain, unspecified: Secondary | ICD-10-CM
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
## 2019-08-16 ENCOUNTER — Ambulatory Visit: Payer: Medicaid Other | Admitting: Physical Therapy

## 2019-08-23 ENCOUNTER — Other Ambulatory Visit: Payer: Self-pay

## 2019-08-23 ENCOUNTER — Ambulatory Visit: Payer: Medicaid Other | Attending: Nurse Practitioner | Admitting: Physical Therapy

## 2019-08-23 ENCOUNTER — Encounter: Payer: Self-pay | Admitting: Physical Therapy

## 2019-08-23 DIAGNOSIS — R252 Cramp and spasm: Secondary | ICD-10-CM | POA: Insufficient documentation

## 2019-08-23 DIAGNOSIS — M545 Low back pain: Secondary | ICD-10-CM | POA: Diagnosis present

## 2019-08-23 DIAGNOSIS — M6281 Muscle weakness (generalized): Secondary | ICD-10-CM | POA: Diagnosis present

## 2019-08-23 DIAGNOSIS — G8929 Other chronic pain: Secondary | ICD-10-CM | POA: Insufficient documentation

## 2019-08-23 NOTE — Patient Instructions (Addendum)
Upper Endoscopy, Adult, Care After This sheet gives you information about how to care for yourself after your procedure. Your health care provider may also give you more specific instructions. If you have problems or questions, contact your health care provider. What can I expect after the procedure? After the procedure, it is common to have:  A sore throat.  Mild stomach pain or discomfort.  Bloating.  Nausea. Follow these instructions at home:   Follow instructions from your health care provider about what to eat or drink after your procedure.  Return to your normal activities as told by your health care provider. Ask your health care provider what activities are safe for you.  Take over-the-counter and prescription medicines only as told by your health care provider.  Do not drive for 24 hours if you were given a sedative during your procedure.  Keep all follow-up visits as told by your health care provider. This is important. Contact a health care provider if you have:  A sore throat that lasts longer than one day.  Trouble swallowing. Get help right away if:  You vomit blood or your vomit looks like coffee grounds.  You have: ? A fever. ? Bloody, black, or tarry stools. ? A severe sore throat or you cannot swallow. ? Difficulty breathing. ? Severe pain in your chest or abdomen. Summary  After the procedure, it is common to have a sore throat, mild stomach discomfort, bloating, and nausea.  Do not drive for 24 hours if you were given a sedative during the procedure.  Follow instructions from your health care provider about what to eat or drink after your procedure.  Return to your normal activities as told by your health care provider. This information is not intended to replace advice given to you by your health care provider. Make sure you discuss any questions you have with your health care provider. Document Revised: 12/20/2017 Document Reviewed:  11/28/2017 Elsevier Patient Education  2020 [employer]   Upper Endoscopy, Adult Upper endoscopy is a procedure to look inside the upper GI (gastrointestinal) tract. The upper GI tract is made up of:  The part of the body that moves food from your mouth to your stomach (esophagus).  The stomach.  The first part of your small intestine (duodenum). This procedure is also called esophagogastroduodenoscopy (EGD) or gastroscopy. In this procedure, your health care provider passes a thin, flexible tube (endoscope) through your mouth and down your esophagus into your stomach. A small camera is attached to the end of the tube. Images from the camera appear on a monitor in the exam room. During this procedure, your health care provider may also remove a small piece of tissue to be sent to a lab and examined under a microscope (biopsy). Your health care provider may do an upper endoscopy to diagnose cancers of the upper GI tract. You may also have this procedure to find the cause of other conditions, such as:  Stomach pain.  Heartburn.  Pain or problems when swallowing.  Nausea and vomiting.  Stomach bleeding.  Stomach ulcers. Tell a health care provider about:  Any allergies you have.  All medicines you are taking, including vitamins, herbs, eye drops, creams, and over-the-counter medicines.  Any problems you or family members have had with anesthetic medicines.  Any blood disorders you have.  Any surgeries you have had.  Any medical conditions you have.  Whether you are pregnant or may be pregnant. What are the risks? Generally, this is  a safe procedure. However, problems may occur, including:  Infection.  Bleeding.  Allergic reactions to medicines.  A tear or hole (perforation) in the esophagus, stomach, or duodenum. What happens before the procedure? Staying hydrated Follow instructions from your health care provider about hydration, which may include:  Up to 2  hours before the procedure - you may continue to drink clear liquids, such as water, clear fruit juice, black coffee, and plain tea.  Eating and drinking restrictions Follow instructions from your health care provider about eating and drinking, which may include:  8 hours before the procedure - stop eating heavy meals or foods, such as meat, fried foods, or fatty foods.  6 hours before the procedure - stop eating light meals or foods, such as toast or cereal.  6 hours before the procedure - stop drinking milk or drinks that contain milk.  2 hours before the procedure - stop drinking clear liquids. Medicines Ask your health care provider about:  Changing or stopping your regular medicines. This is especially important if you are taking diabetes medicines or blood thinners.  Taking medicines such as aspirin and ibuprofen. These medicines can thin your blood. Do not take these medicines unless your health care provider tells you to take them.  Taking over-the-counter medicines, vitamins, herbs, and supplements. General instructions  Plan to have someone take you home from the hospital or clinic.  If you will be going home right after the procedure, plan to have someone with you for 24 hours.  Ask your health care provider what steps will be taken to help prevent infection. What happens during the procedure?   An IV will be inserted into one of your veins.  You may be given one or more of the following: ? A medicine to help you relax (sedative). ? A medicine to numb the throat (local anesthetic).  You will lie on your left side on an exam table.  Your health care provider will pass the endoscope through your mouth and down your esophagus.  Your health care provider will use the scope to check the inside of your esophagus, stomach, and duodenum. Biopsies may be taken.  The endoscope will be removed. The procedure may vary among health care providers and hospitals. What happens  after the procedure?  Your blood pressure, heart rate, breathing rate, and blood oxygen level will be monitored until you leave the hospital or clinic.  Do not drive for 24 hours if you were given a sedative during your procedure.  When your throat is no longer numb, you may be given some fluids to drink.  It is up to you to get the results of your procedure. Ask your health care provider, or the department that is doing the procedure, when your results will be ready. Summary  Upper endoscopy is a procedure to look inside the upper GI tract.  During the procedure, an IV will be inserted into one of your veins. You may be given a medicine to help you relax.  A medicine will be used to numb your throat.  The endoscope will be passed through your mouth and down your esophagus. This information is not intended to replace advice given to you by your health care provider. Make sure you discuss any questions you have with your health care provider. Document Revised: 12/21/2017 Document Reviewed: 11/28/2017 Elsevier Patient Education  2020 [employer]   Dietary Guidelines to Help Prevent Kidney Stones Kidney stones are deposits of minerals and salts that form  inside your kidneys. Your risk of developing kidney stones may be greater depending on your diet, your lifestyle, the medicines you take, and whether you have certain medical conditions. Most people can reduce their chances of developing kidney stones by following the instructions below. Depending on your overall health and the type of kidney stones you tend to develop, your dietitian may give you more specific instructions. What are tips for following this plan? Reading food labels  Choose foods with "no salt added" or "low-salt" labels. Limit your sodium intake to less than 1500 mg per day.  Choose foods with calcium for each meal and snack. Try to eat about 300 mg of calcium at each meal. Foods that contain 200-500 mg of calcium per  serving include: ? 8 oz (237 ml) of milk, fortified nondairy milk, and fortified fruit juice. ? 8 oz (237 ml) of kefir, yogurt, and soy yogurt. ? 4 oz (118 ml) of tofu. ? 1 oz of cheese. ? 1 cup (300 g) of dried figs. ? 1 cup (91 g) of cooked broccoli. ? 1-3 oz can of sardines or mackerel.  Most people need 1000 to 1500 mg of calcium each day. Talk to your dietitian about how much calcium is recommended for you. Shopping  Buy plenty of fresh fruits and vegetables. Most people do not need to avoid fruits and vegetables, even if they contain nutrients that may contribute to kidney stones.  When shopping for convenience foods, choose: ? Whole pieces of fruit. ? Premade salads with dressing on the side. ? Low-fat fruit and yogurt smoothies.  Avoid buying frozen meals or prepared deli foods.  Look for foods with live cultures, such as yogurt and kefir. Cooking  Do not add salt to food when cooking. Place a salt shaker on the table and allow each person to add his or her own salt to taste.  Use vegetable protein, such as beans, textured vegetable protein (TVP), or tofu instead of meat in pasta, casseroles, and soups. Meal planning   Eat less salt, if told by your dietitian. To do this: ? Avoid eating processed or premade food. ? Avoid eating fast food.  Eat less animal protein, including cheese, meat, poultry, or fish, if told by your dietitian. To do this: ? Limit the number of times you have meat, poultry, fish, or cheese each week. Eat a diet free of meat at least 2 days a week. ? Eat only one serving each day of meat, poultry, fish, or seafood. ? When you prepare animal protein, cut pieces into small portion sizes. For most meat and fish, one serving is about the size of one deck of cards.  Eat at least 5 servings of fresh fruits and vegetables each day. To do this: ? Keep fruits and vegetables on hand for snacks. ? Eat 1 piece of fruit or a handful of berries with  breakfast. ? Have a salad and fruit at lunch. ? Have two kinds of vegetables at dinner.  Limit foods that are high in a substance called oxalate. These include: ? Spinach. ? Rhubarb. ? Beets. ? Potato chips and french fries. ? Nuts.  If you regularly take a diuretic medicine, make sure to eat at least 1-2 fruits or vegetables high in potassium each day. These include: ? Avocado. ? Banana. ? Orange, prune, carrot, or tomato juice. ? Baked potato. ? Cabbage. ? Beans and split peas. General instructions   Drink enough fluid to keep your urine clear or pale yellow.  This is the most important thing you can do.  Talk to your health care provider and dietitian about taking daily supplements. Depending on your health and the cause of your kidney stones, you may be advised: ? Not to take supplements with vitamin C. ? To take a calcium supplement. ? To take a daily probiotic supplement. ? To take other supplements such as magnesium, fish oil, or vitamin B6.  Take all medicines and supplements as told by your health care provider.  Limit alcohol intake to no more than 1 drink a day for nonpregnant women and 2 drinks a day for men. One drink equals 12 oz of beer, 5 oz of wine, or 1 oz of hard liquor.  Lose weight if told by your health care provider. Work with your dietitian to find strategies and an eating plan that works best for you. What foods are not recommended? Limit your intake of the following foods, or as told by your dietitian. Talk to your dietitian about specific foods you should avoid based on the type of kidney stones and your overall health. Grains Breads. Bagels. Rolls. Baked goods. Salted crackers. Cereal. Pasta. Vegetables Spinach. Rhubarb. Beets. Canned vegetables. Rosita Fire. Olives. Meats and other protein foods Nuts. Nut butters. Large portions of meat, poultry, or fish. Salted or cured meats. Deli meats. Hot dogs. Sausages. Dairy Cheese. Beverages Regular soft  drinks. Regular vegetable juice. Seasonings and other foods Seasoning blends with salt. Salad dressings. Canned soups. Soy sauce. Ketchup. Barbecue sauce. Canned pasta sauce. Casseroles. Pizza. Lasagna. Frozen meals. Potato chips. [place] fries. Summary  You can reduce your risk of kidney stones by making changes to your diet.  The most important thing you can do is drink enough fluid. You should drink enough fluid to keep your urine clear or pale yellow.  Ask your health care provider or dietitian how much protein from animal sources you should eat each day, and also how much salt and calcium you should have each day. This information is not intended to replace advice given to you by your health care provider. Make sure you discuss any questions you have with your health care provider. Document Revised: 10/18/2018 Document Reviewed: 06/08/2016 Elsevier Patient Education  2020 [employer] Inc.   Kidney Stones Kidney stones are rock-like masses that form inside of the kidneys. Kidneys are organs that make pee (urine). A kidney stone may move into other parts of the urinary tract, including:  The tubes that connect the kidneys to the bladder (ureters).  The bladder.  The tube that carries urine out of the body (urethra). Kidney stones can cause very bad pain and can block the flow of pee. The stone usually leaves your body (passes) through your pee. You may need to have a doctor take out the stone. What are the causes? Kidney stones may be caused by:  A condition in which certain glands make too much parathyroid hormone (primary hyperparathyroidism).  A buildup of a type of crystals in the bladder made of a chemical called uric acid. The body makes uric acid when you eat certain foods.  Narrowing (stricture) of one or both of the ureters.  A kidney blockage that you were born with.  Past surgery on the kidney or the ureters, such as gastric bypass surgery. What increases the risk? You  are more likely to develop this condition if:  You have had a kidney stone in the past.  You have a family history of kidney stones.  You do not  drink enough water.  You eat a diet that is high in protein, salt (sodium), or sugar.  You are overweight or very overweight (obese). What are the signs or symptoms? Symptoms of a kidney stone may include:  Pain in the side of the belly, right below the ribs (flank pain). Pain usually spreads (radiates) to the groin.  Needing to pee often or right away (urgently).  Pain when going pee (urinating).  Blood in your pee (hematuria).  Feeling like you may vomit (nauseous).  Vomiting.  Fever and chills. How is this treated? Treatment depends on the size, location, and makeup of the kidney stones. The stones will often pass out of the body through peeing. You may need to:  Drink more fluid to help pass the stone. In some cases, you may be given fluids through an IV tube put into one of your veins at the hospital.  Take medicine for pain.  Make changes in your diet to help keep kidney stones from coming back. Sometimes, medical procedures are needed to remove a kidney stone. This may involve:  A procedure to break up kidney stones using a beam of light (laser) or shock waves.  Surgery to remove the kidney stones. Follow these instructions at home: Medicines  Take over-the-counter and prescription medicines only as told by your doctor.  Ask your doctor if the medicine prescribed to you requires you to avoid driving or using heavy machinery. Eating and drinking  Drink enough fluid to keep your pee pale yellow. You may be told to drink at least 8-10 glasses of water each day. This will help you pass the stone.  If told by your doctor, change your diet. This may include: ? Limiting how much salt you eat. ? Eating more fruits and vegetables. ? Limiting how much meat, poultry, fish, and eggs you eat.  Follow instructions from your  doctor about eating or drinking restrictions. General instructions  Collect pee samples as told by your doctor. You may need to collect a pee sample: ? 24 hours after a stone comes out. ? 8-12 weeks after a stone comes out, and every 6-12 months after that.  Strain your pee every time you pee (urinate), for as long as told. Use the strainer that your doctor recommends.  Do not throw out the stone. Keep it so that it can be tested by your doctor.  Keep all follow-up visits as told by your doctor. This is important. You may need follow-up tests. How is this prevented? To prevent another kidney stone:  Drink enough fluid to keep your pee pale yellow. This is the best way to prevent kidney stones.  Eat healthy foods.  Avoid certain foods as told by your doctor. You may be told to eat less protein.  Stay at a healthy weight. Where to find more information  [employer] (NKF): www.kidney.org  [facility]): www.urologyhealth.org Contact a doctor if:  You have pain that gets worse or does not get better with medicine. Get help right away if:  You have a fever or chills.  You get very bad pain.  You get new pain in your belly (abdomen).  You pass out (faint).  You cannot pee. Summary  Kidney stones are rock-like masses that form inside of the kidneys.  Kidney stones can cause very bad pain and can block the flow of pee.  The stones will often pass out of the body through peeing.  Drink enough fluid to keep your  pee pale yellow. This information is not intended to replace advice given to you by your health care provider. Make sure you discuss any questions you have with your health care provider. Document Revised: 11/14/2018 Document Reviewed: 11/14/2018 Elsevier Patient Education  2020 [employer].  [facility] [address], [address] [place], [place] [address] Phone # [phone] Fax [phone]

## 2019-08-23 NOTE — Therapy (Signed)
[facility] [address] [place], [place], [address] Phone: [phone]   Fax:  [phone]  Physical Therapy Treatment  Patient Details  Name: Chloe Rodgers MRN: [id number] Date of Birth: 1991-05-28 Referring Provider (PT): Emelia Loron, NP   Encounter Date: 08/23/2019  PT End of Session - 08/23/19 0927    Visit Number  10    Date for PT Re-Evaluation  10/03/19    Authorization Time Period  08/16/2019-11/07/2019    Authorization - Visit Number  1    Authorization - Number of Visits  12    PT Start Time  0845    PT Stop Time  3790    PT Time Calculation (min)  40 min    Activity Tolerance  Patient tolerated treatment well;No increased pain    Behavior During Therapy  WFL for tasks assessed/performed       Past Medical History:  Diagnosis Date  . Anemia   . Anxiety   . Chlamydia   . Complication of anesthesia    ??, seizure with wisdom teeth  . Depression    not on meds, sees a therapist  . Infection    UTI  . Kidney stone    kidney stones  . Migraine   . PID (acute pelvic inflammatory disease) 2014  . PONV (postoperative nausea and vomiting)   . Psoriasis   . Seizures [facility])    July 2013 - [place]    Past Surgical History:  Procedure Laterality Date  . CESAREAN SECTION    . CESAREAN SECTION  06/06/2012   Procedure: CESAREAN SECTION;  Surgeon: Woodroe Mode, MD;  Location: [facility];  Service: Obstetrics;  Laterality: N/A;  . CESAREAN SECTION N/A 03/30/2017   Procedure: REPEAT CESAREAN SECTION;  Surgeon: Florian Buff, MD;  Location: [place];  Service: Obstetrics;  Laterality: N/A;  . SKIN GRAFT     off abd, onto arm  . WISDOM TOOTH EXTRACTION      There were no vitals filed for this visit.  Subjective Assessment - 08/23/19 0852    Subjective  I found out I had a kidney stone. the MD said I have unkown bacteria in my urine so I need further testing. I have alot of discharge coming  from the cervix.    Patient Stated Goals  feel better and less pain    Currently in Pain?  Yes    Pain Score  5     Pain Location  Pelvis    Pain Orientation  Lower;Mid    Pain Descriptors / Indicators  Dull    Pain Type  Chronic pain    Pain Onset  1 to 4 weeks ago    Pain Frequency  Constant    Aggravating Factors   sitting too long, standing too long, walking long distance, vaginal penetration, getting out of the bed    Pain Relieving Factors  rest and medication    Multiple Pain Sites  Yes    Pain Score  5    Pain Location  Back    Pain Orientation  Lower    Pain Descriptors / Indicators  Dull    Pain Type  Acute pain    Pain Onset  More than a month ago    Pain Frequency  Constant    Aggravating Factors   sitting or standing too long    Pain Relieving Factors  reset  [facility] Adult PT Treatment/Exercise - 08/23/19 0001      Self-Care   Self-Care  Other Self-Care Comments    Other Self-Care Comments   every 2 hours sit on the commode for 5 minutes and do diaphragmatic breathing  to relax the pelvic floor and urinate due to patient not having the urge to urinate all day. educated patient on kidney stone and endoscopy  since she  is nervouse about them      Lumbar Exercises: Stretches   Active Hamstring Stretch  Right;Left;30 seconds;2 reps    Active Hamstring Stretch Limitations  stra    Single Knee to Chest Stretch  Right;Left;1 rep;30 seconds    Double Knee to Chest Stretch  1 rep;30 seconds    Double Knee to Chest Stretch Limitations  knees apart    Lower Trunk Rotation  2 reps;30 seconds    Piriformis Stretch  Right;Left;2 reps;30 seconds    Piriformis Stretch Limitations  supine    Other Lumbar Stretch Exercise  hip adductors in supine      Lumbar Exercises: Supine   Bridge  15 reps    Isometric Hip Flexion  10 reps;5 seconds    Isometric Hip Flexion Limitations  each leg to engage the core             PT Education -  08/23/19 0926    Education Details  gave patient information on kidney stone and endoscopy since she has them and was nervouse    Person(s) Educated  Patient    Methods  Explanation;Handout    Comprehension  Verbalized understanding       PT Short Term Goals - 02/21/19 1251      PT SHORT TERM GOAL #1   Title  ind with initial HEP    Time  4    Period  Weeks    Status  Achieved    Target Date  02/19/19      PT SHORT TERM GOAL #2   Title  pt will tolerate internal soft tissue assessment and treatment as needed    Period  Weeks    Status  Achieved    Target Date  02/19/19        PT Long Term Goals - 08/23/19 0931      PT LONG TERM GOAL #1   Title  pt will be ind with advanced HEP for pain management    Baseline  continues to have further education as she progresses    Time  12    Period  Weeks    Status  On-going      PT LONG TERM GOAL #2   Title  Pt will report 50% less pain during daily activities so she can perform tasks such as meal prep without assistance    Baseline  pain is 5/10 instead of 9/10    Time  12    Period  Weeks    Status  On-going            Plan - 08/23/19 0928    Clinical Impression Statement  Patient is having alot of pain due to having a kidney stone and bacteria in her urine. Patient is having further test to see why she is having bacteria in the urine. Therapist is not doing internal work due to the bacteria. Patient reports she has extra discharge from her cervix and not sure why. While patient stretches her muscles will tremor and may be due to her being dehydrated. Patient reports  she is not able to drink water and feels dehydrated. Patient feels weak so we worked on strength. Patient will benefit from skilled therapy to manage her pain while working on her strength.    Personal Factors and Comorbidities  Comorbidity 1;Comorbidity 2;Time since onset of injury/illness/exacerbation;Fitness    Comorbidities  acute pelvic inflammatory disease,  seizures    Examination-Activity Limitations  Bed Mobility;Locomotion Level;Transfers;Continence;Toileting    Examination-Participation Restrictions  Community Activity;Shop;Laundry;Meal Prep    Stability/Clinical Decision Making  Evolving/Moderate complexity    Rehab Potential  Good    PT Frequency  1x / week    PT Duration  8 weeks    PT Treatment/Interventions  Biofeedback;Cryotherapy;Electrical Stimulation;Moist Heat;Ultrasound;Therapeutic exercise;Therapeutic activities;Neuromuscular re-education;Patient/family education;Passive range of motion;Manual techniques;Spinal Manipulations;Joint Manipulations    PT Next Visit Plan  continue to work on core and pelvic floor strength with daily activities    PT Home Exercise Plan  Access Code: [email or web address]    Consulted and Agree with Plan of Care  Patient       Patient will benefit from skilled therapeutic intervention in order to improve the following deficits and impairments:  Decreased coordination, Decreased range of motion, Increased fascial restricitons, Decreased endurance, Increased muscle spasms, Decreased activity tolerance, Pain, Decreased scar mobility, Impaired flexibility, Decreased mobility, Decreased strength  Visit Diagnosis: Muscle weakness (generalized)  Cramp and spasm  Chronic low back pain without sciatica, unspecified back pain laterality     Problem List Patient Active Problem List   Diagnosis Date Noted  . Supervision of low-risk pregnancy 11/23/2018  . Chronic PID (chronic pelvic inflammatory disease) 12/13/2017  . Status post repeat low transverse cesarean section 03/30/2017  . Pelvic inflammatory disease (PID) 02/21/2013  . Migraines 07/24/2012  . Previous cesarean delivery, antepartum condition or complication 03/30/2012  . Obese 03/30/2012  . Seizure disorder ([facility]) 02/03/2012  . Family history of breast cancer in mother 02/03/2012    Eulis Foster, PT 08/23/19 9:32 AM   [facility] [address] [place], [place], [address] Phone: [phone]   Fax:  [phone]  Name: Alejandria Wessells MRN: [id number] Date of Birth: Dec 06, 1990

## 2019-09-05 ENCOUNTER — Ambulatory Visit: Payer: Medicaid Other | Admitting: Physical Therapy

## 2019-09-10 ENCOUNTER — Ambulatory Visit: Payer: Medicaid Other | Admitting: Physical Therapy

## 2019-09-15 IMAGING — CT CT CERVICAL SPINE W/O CM
4 of 7 series · 13 of 33 positions shown, 14 images · non-contrast
Comparison: 05/02/2016

CLINICAL DATA: syncopal episode and fell in parking lot. Children
report that mother was complaining of dizziness before arrival. Upon
exiting car, patient was stumbling and then fell forward onto the
ground. Upon staff arrival to patient, patient was laying face down
on the pavement. Even, unlabored respirations noted and patients
eyelids fluttering.

EXAM:
CT HEAD WITHOUT CONTRAST
CT CERVICAL SPINE WITHOUT CONTRAST
TECHNIQUE: Multidetector CT imaging of the head and cervical spine was
performed following the standard protocol without intravenous
contrast. Multiplanar CT image reconstructions of the cervical spine
were also generated.

[Series 4: coronal soft tissue · coronal · 0.34mm/px · 2 of 66 slices shown]
[im 22/66  bone]
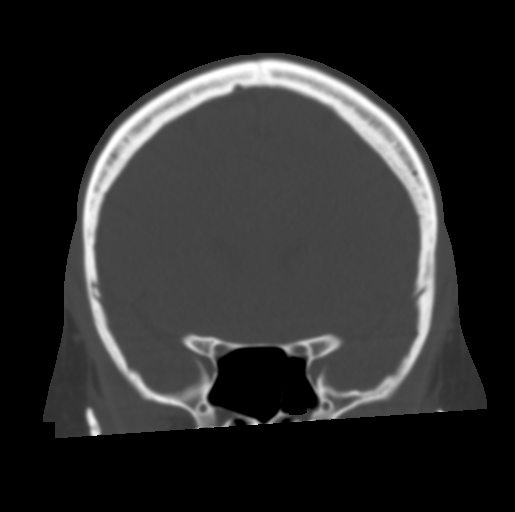
[im 44/66  bone]
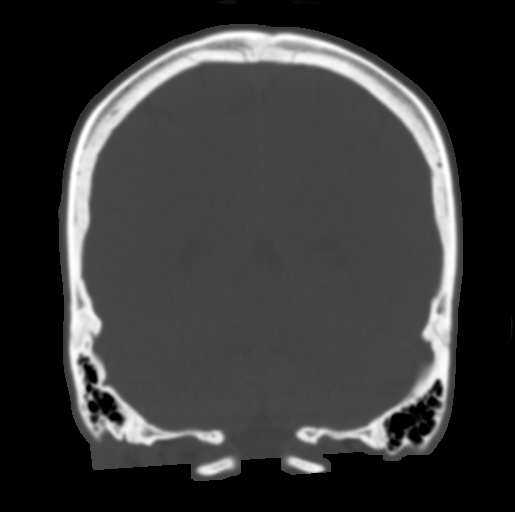

[Series 7: c spine soft · axial · 0.29mm/px · z∈[-276,-204]mm · 3 of 90 slices shown]
[im 18/90  soft-tissue]
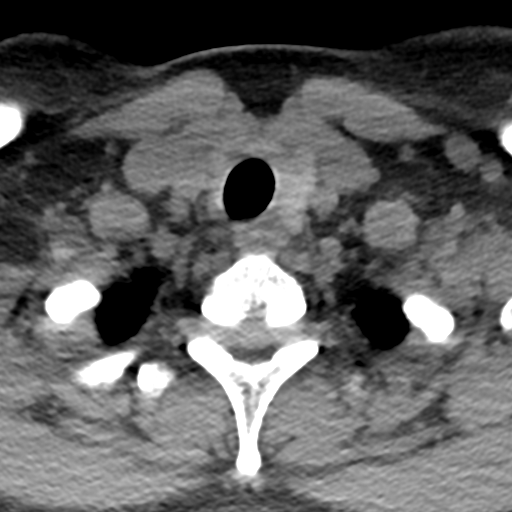
[im 36/90  soft-tissue]
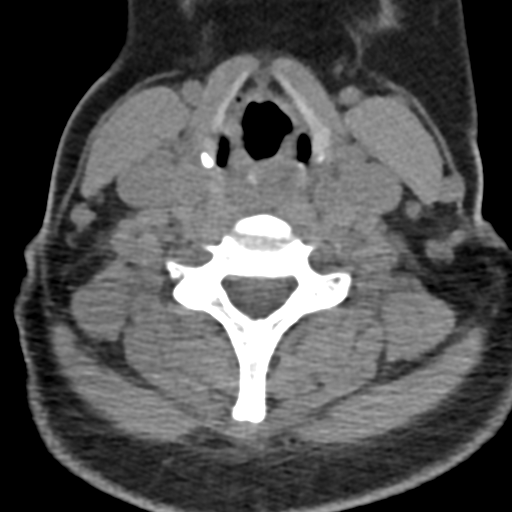
[im 54/90  soft-tissue]
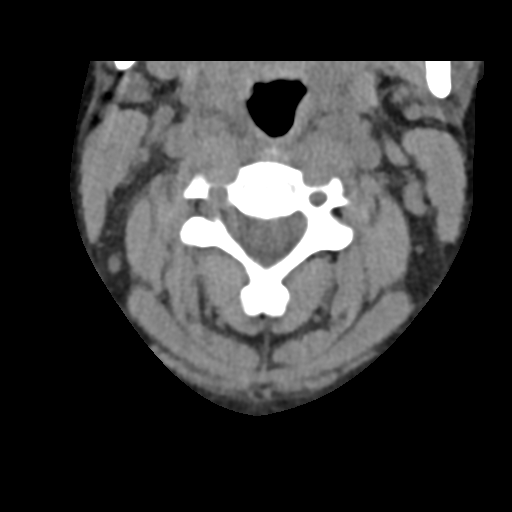

[Series 10: sagittal bone · sagittal · 0.21mm/px · 4 of 46 slices shown]
[im 10/46  bone]
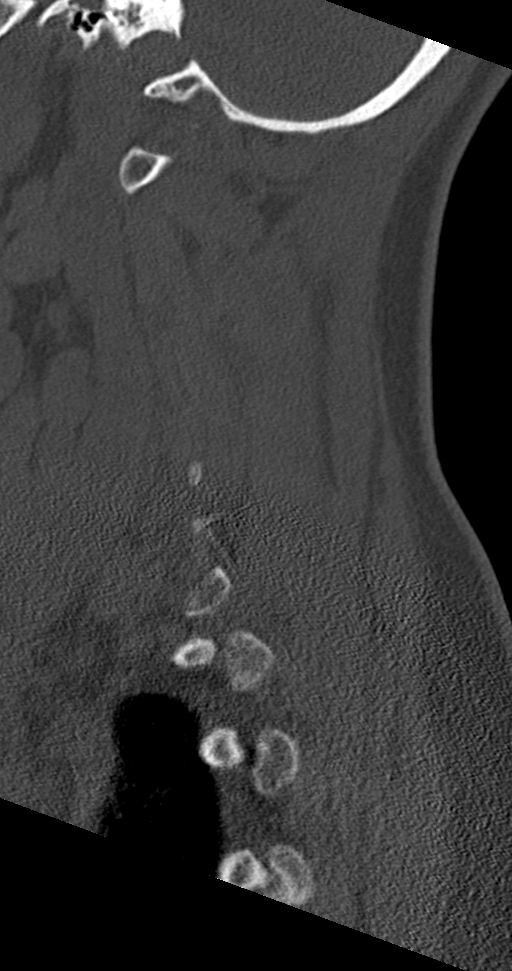
[im 19/46  bone]
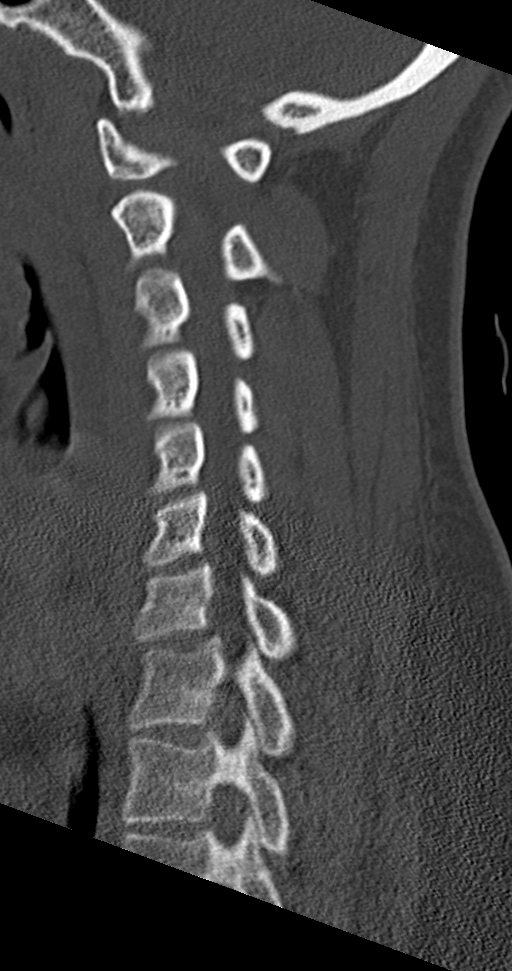
[im 28/46  bone]
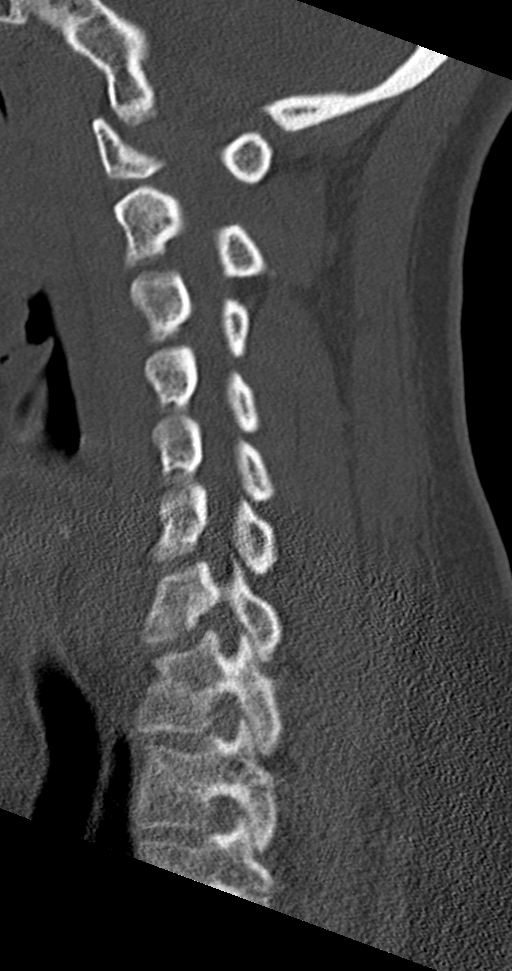
[im 37/46  bone]
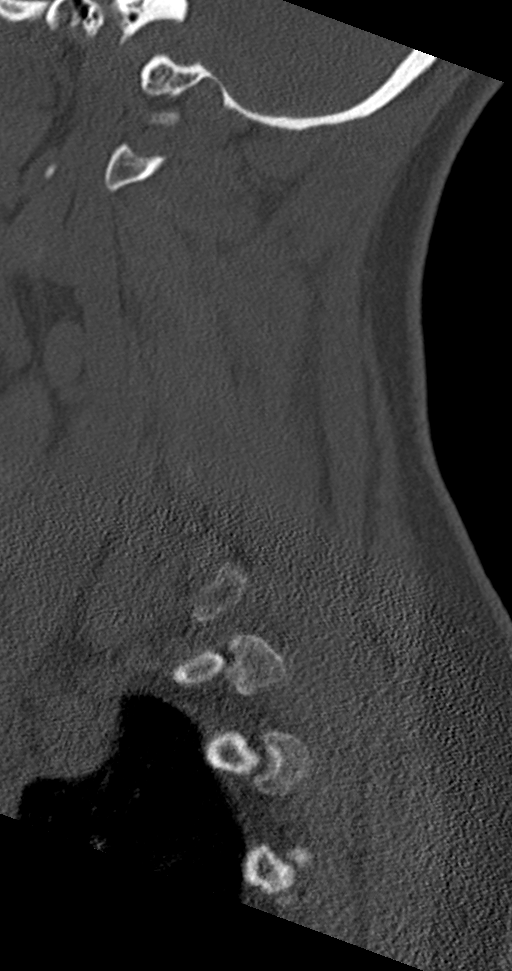

[Series 12: orthogonal bone · axial · 0.20mm/px · z∈[-302,-191]mm · 4 of 102 slices shown, 5 images]
[im 21/102  soft-tissue]
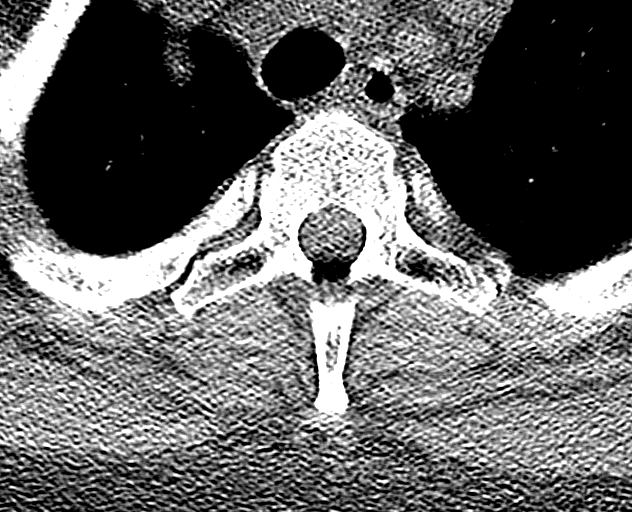
[im 21/102  bone]
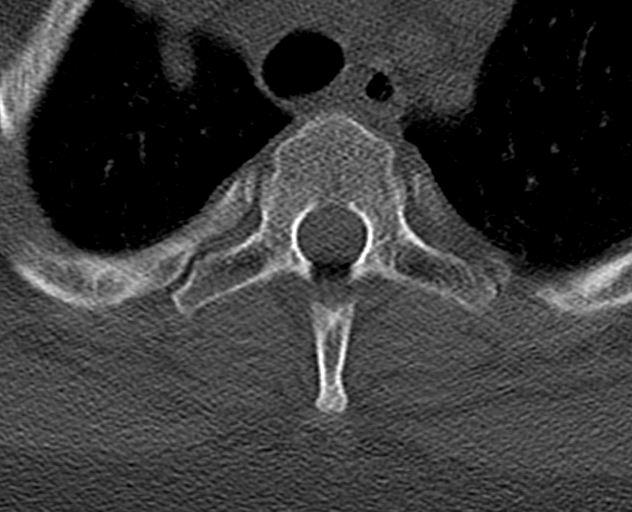
[im 41/102  bone]
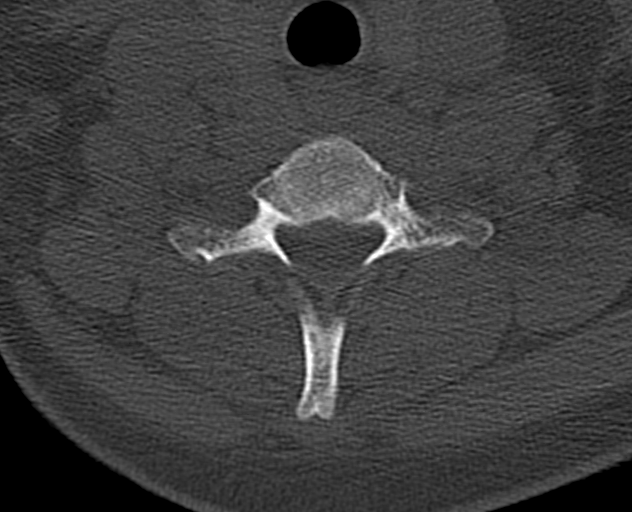
[im 61/102  bone]
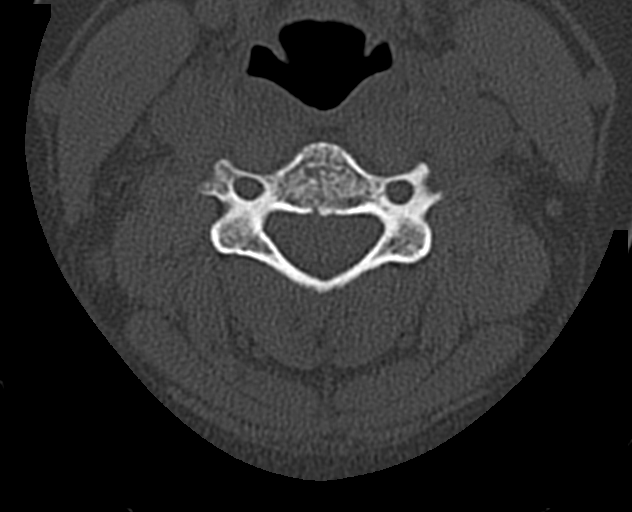
[im 81/102  bone]
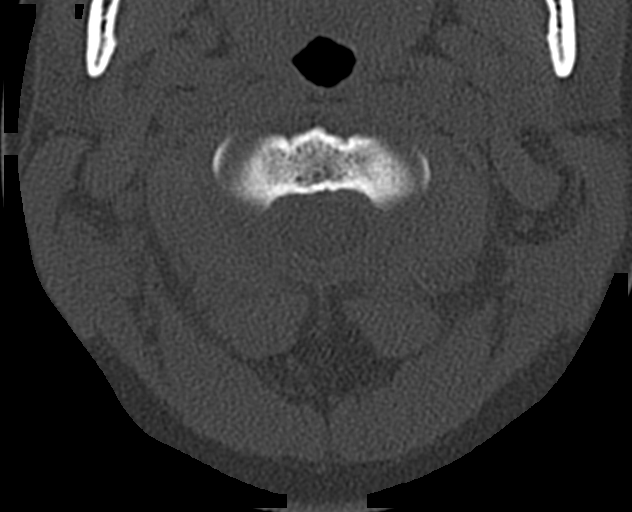

[13 of 33 positions shown; findings below may reference images not displayed]

FINDINGS: CT HEAD FINDINGS

Brain: No evidence of acute infarction, hemorrhage, hydrocephalus,
extra-axial collection or mass lesion/mass effect.

Vascular: No hyperdense vessel or unexpected calcification.

Skull: Normal. Negative for fracture or focal lesion.

Sinuses/Orbits: No acute finding.

Other: None.

CT CERVICAL SPINE FINDINGS

Alignment: Mild reversal of the normal lordosis.

Skull base and vertebrae: No acute fracture. No primary bone lesion
or focal pathologic process.

Soft tissues and spinal canal: No prevertebral fluid or swelling. No
visible canal hematoma.

Disc levels: Maintained throughout. No significant degenerative
change.

Upper chest: Negative.

Other: None
IMPRESSION: 1. Negative for bleed or other acute intracranial process.
2. Negative for cervical fracture or other acute injury.
3. Loss of the normal cervical spine lordosis, which may be
secondary to positioning, spasm, or soft tissue injury.

## 2019-09-19 ENCOUNTER — Ambulatory Visit: Payer: Medicaid Other | Admitting: Physical Therapy

## 2019-09-24 ENCOUNTER — Ambulatory Visit: Payer: Medicaid Other | Attending: Nurse Practitioner | Admitting: Physical Therapy

## 2019-09-24 ENCOUNTER — Telehealth: Payer: Self-pay | Admitting: Physical Therapy

## 2019-09-24 DIAGNOSIS — M6281 Muscle weakness (generalized): Secondary | ICD-10-CM | POA: Insufficient documentation

## 2019-09-24 DIAGNOSIS — R252 Cramp and spasm: Secondary | ICD-10-CM | POA: Insufficient documentation

## 2019-09-24 DIAGNOSIS — G8929 Other chronic pain: Secondary | ICD-10-CM | POA: Insufficient documentation

## 2019-09-24 DIAGNOSIS — M545 Low back pain: Secondary | ICD-10-CM | POA: Insufficient documentation

## 2019-09-24 NOTE — Telephone Encounter (Signed)
Called patient and she thought her appointment was tomorrow.  Eulis Foster, PT @3 /15/2021@ 12:01 PM

## 2019-09-26 ENCOUNTER — Ambulatory Visit: Payer: Medicaid Other | Admitting: Physical Therapy

## 2019-10-02 ENCOUNTER — Ambulatory Visit: Payer: Medicaid Other | Admitting: Physical Therapy

## 2019-10-10 ENCOUNTER — Ambulatory Visit: Payer: Medicaid Other | Admitting: Physical Therapy

## 2019-10-10 ENCOUNTER — Other Ambulatory Visit: Payer: Self-pay

## 2019-10-10 ENCOUNTER — Encounter: Payer: Self-pay | Admitting: Physical Therapy

## 2019-10-10 DIAGNOSIS — M6281 Muscle weakness (generalized): Secondary | ICD-10-CM

## 2019-10-10 DIAGNOSIS — M545 Low back pain, unspecified: Secondary | ICD-10-CM

## 2019-10-10 DIAGNOSIS — R252 Cramp and spasm: Secondary | ICD-10-CM

## 2019-10-10 DIAGNOSIS — G8929 Other chronic pain: Secondary | ICD-10-CM

## 2019-10-10 NOTE — Therapy (Signed)
[facility] [address] [place], [place], [address] Phone: [phone]   Fax:  [phone]  Physical Therapy Treatment  Patient Details  Name: Chloe Rodgers MRN: [id number] Date of Birth: 1990/09/12 Referring Provider (PT): Carmel Sacramento, NP   Encounter Date: 10/10/2019  PT End of Session - 10/10/19 1632    Visit Number  11    Date for PT Re-Evaluation  11/05/19    Authorization Type  Medicaid    Authorization Time Period  08/16/2019-11/07/2019    Authorization - Visit Number  2    Authorization - Number of Visits  12    PT Start Time  1614    PT Stop Time  1652    PT Time Calculation (min)  38 min    Activity Tolerance  Patient tolerated treatment well;No increased pain    Behavior During Therapy  WFL for tasks assessed/performed       Past Medical History:  Diagnosis Date  . Anemia   . Anxiety   . Chlamydia   . Complication of anesthesia    ??, seizure with wisdom teeth  . Depression    not on meds, sees a therapist  . Infection    UTI  . Kidney stone    kidney stones  . Migraine   . PID (acute pelvic inflammatory disease) 2014  . PONV (postoperative nausea and vomiting)   . Psoriasis   . Seizures [facility])    July 2013 - [place]    Past Surgical History:  Procedure Laterality Date  . CESAREAN SECTION    . CESAREAN SECTION  06/06/2012   Procedure: CESAREAN SECTION;  Surgeon: Adam Phenix, MD;  Location: [facility];  Service: Obstetrics;  Laterality: N/A;  . CESAREAN SECTION N/A 03/30/2017   Procedure: REPEAT CESAREAN SECTION;  Surgeon: Lazaro Arms, MD;  Location: [facility];  Service: Obstetrics;  Laterality: N/A;  . SKIN GRAFT     off abd, onto arm  . WISDOM TOOTH EXTRACTION      There were no vitals filed for this visit.  Subjective Assessment - 10/10/19 1616    Subjective  I feel better when I am on medicine. I will have sugery on 4/26 and I will have him look at my bladder.     Patient Stated Goals  feel better and less pain    Currently in Pain?  Yes    Pain Score  5     Pain Location  Pelvis    Pain Orientation  Lower;Mid    Pain Descriptors / Indicators  Dull    Pain Type  Chronic pain    Pain Onset  1 to 4 weeks ago    Pain Frequency  Constant    Aggravating Factors   sitting too long,standing too long, walking long distance,vaginal penetration, getting out of the bed    Pain Relieving Factors  rest and medication    Multiple Pain Sites  Yes    Pain Score  7    Pain Location  Back    Pain Orientation  Lower    Pain Descriptors / Indicators  Dull    Pain Type  Acute pain    Pain Onset  More than a month ago    Pain Frequency  Constant    Aggravating Factors   sitting or standing too long    Pain Relieving Factors  rest         [facility] PT Assessment - 10/10/19 0001  Assessment   Medical Diagnosis  R10.2 Chronic Pelvic pain in female    Referring Provider (PT)  Carmel Sacramento, NP    Onset Date/Surgical Date  --   chronic, 2012 estimate   Prior Therapy  yes      Precautions   Precautions  None      Restrictions   Weight Bearing Restrictions  No      Home Environment   Living Environment  Private residence      Prior Function   Level of Independence  Independent      Cognition   Overall Cognitive Status  Within Functional Limits for tasks assessed      Posture/Postural Control   Posture/Postural Control  Postural limitations    Postural Limitations  Rounded Shoulders;Forward head      AROM   Lumbar Flexion  full    Lumbar Extension  full    Lumbar - Right Side Bend  full    Lumbar - Left Side Bend  full    Lumbar - Right Rotation  full    Lumbar - Left Rotation  full      PROM   Right Hip External Rotation   65    Left Hip External Rotation   60      Strength   Right Hip Extension  5/5    Right Hip ABduction  4/5    Right Hip ADduction  5/5    Left Hip Extension  4/5    Left Hip ABduction  4/5    Left Hip ADduction  5/5                 Pelvic Floor Special Questions - 10/10/19 0001    Urinary Leakage  Yes    Pad use  wears 4-5 pads per day    Activities that cause leaking  Other;Coughing;Laughing;Sneezing    Other activities that cause leaking  night while laying down, standing up too long    Urinary frequency  trouble initiating urine and to use the bathroom    Fecal incontinence  No    Exam Type  Deferred        [facility] Adult PT Treatment/Exercise - 10/10/19 0001      Lumbar Exercises: Stretches   Active Hamstring Stretch  Right;Left;1 rep;30 seconds    Double Knee to Chest Stretch  1 rep;30 seconds    Lower Trunk Rotation  2 reps;30 seconds    Press Ups  5 reps    Quadruped Mid Back Stretch  1 rep;30 seconds    Piriformis Stretch  Right;Left;2 reps;30 seconds    Piriformis Stretch Limitations  supine      Lumbar Exercises: Supine   Bridge  10 reps    Bridge Limitations  with breathing out as she lifts    Isometric Hip Flexion  10 reps;5 seconds    Isometric Hip Flexion Limitations  each leg to engage the core      Lumbar Exercises: Sidelying   Clam  Right;Left;15 reps;1 second               PT Short Term Goals - 02/21/19 1251      PT SHORT TERM GOAL #1   Title  ind with initial HEP    Time  4    Period  Weeks    Status  Achieved    Target Date  02/19/19      PT SHORT TERM GOAL #2   Title  pt will tolerate internal  soft tissue assessment and treatment as needed    Period  Weeks    Status  Achieved    Target Date  02/19/19        PT Long Term Goals - 10/10/19 1639      PT LONG TERM GOAL #1   Title  pt will be ind with advanced HEP for pain management    Baseline  continues to have further education as she progresses    Time  12    Period  Weeks    Status  On-going      PT LONG TERM GOAL #2   Title  Pt will report 50% less pain during daily activities so she can perform tasks such as meal prep without assistance    Baseline  only when takes medicine     Time  12    Period  Weeks    Status  On-going      PT LONG TERM GOAL #3   Title  Pt will demonstrate 4+/5 MMT on bilateral hip flexion and abduction for improved upright posture during functional activities.    Time  12    Period  Weeks    Status  Achieved      PT LONG TERM GOAL #4   Title  pt will be able to perfrom functional transfers without increased pain due to increased core strength.    Baseline  only when takes medicine    Time  12    Period  Weeks    Status  On-going      PT LONG TERM GOAL #5   Title  able to have internal penetration to prepare for vaginal exam with pain level </= 3/10    Baseline  unable to have vaginal penetration due to pain level 10/10 when therapist was trying to assess patient    Time  12    Period  Weeks    Status  Deferred            Plan - 10/10/19 1631    Clinical Impression Statement  Patient pain is reduce when she takes her medication. Patient continues to leak urine. Patient has difficulty with intiating a urine stream. Patient is able to do her core exercises but needs verbal cues to engage her abdomen. Patient reports she will be having bladder surgery on 11/05/19. Patient has full lumbar ROM. Patient pelvic floor strength is low due to urinary leakage. Patient will benefit from therapy to continue with core strength and reduce pelvic pain prior to her surgery.    Personal Factors and Comorbidities  Comorbidity 1;Comorbidity 2;Time since onset of injury/illness/exacerbation;Fitness    Comorbidities  acute pelvic inflammatory disease, seizures    Examination-Activity Limitations  Bed Mobility;Locomotion Level;Transfers;Continence;Toileting    Examination-Participation Restrictions  Community Activity;Shop;Laundry;Meal Prep    Stability/Clinical Decision Making  Evolving/Moderate complexity    Rehab Potential  Good    PT Frequency  1x / week    PT Duration  8 weeks    PT Treatment/Interventions  Biofeedback;Cryotherapy;Electrical  Stimulation;Moist Heat;Ultrasound;Therapeutic exercise;Therapeutic activities;Neuromuscular re-education;Patient/family education;Passive range of motion;Manual techniques;Spinal Manipulations;Joint Manipulations    PT Next Visit Plan  continue to work on core and pelvic floor strength with daily activities    PT Home Exercise Plan  Access Code: [email or web address]    Consulted and Agree with Plan of Care  Patient       Patient will benefit from skilled therapeutic intervention in order to improve the following deficits and impairments:  Decreased coordination,  Decreased range of motion, Increased fascial restricitons, Decreased endurance, Increased muscle spasms, Decreased activity tolerance, Pain, Decreased scar mobility, Impaired flexibility, Decreased mobility, Decreased strength  Visit Diagnosis: Muscle weakness (generalized) - Plan: PT plan of care cert/re-cert  Cramp and spasm - Plan: PT plan of care cert/re-cert  Chronic low back pain without sciatica, unspecified back pain laterality - Plan: PT plan of care cert/re-cert     Problem List Patient Active Problem List   Diagnosis Date Noted  . Supervision of low-risk pregnancy 11/23/2018  . Chronic PID (chronic pelvic inflammatory disease) 12/13/2017  . Status post repeat low transverse cesarean section 03/30/2017  . Pelvic inflammatory disease (PID) 02/21/2013  . Migraines 07/24/2012  . Previous cesarean delivery, antepartum condition or complication 40/02/6760  . Obese 03/30/2012  . Seizure disorder ([place]) 02/03/2012  . Family history of breast cancer in mother 02/03/2012    Earlie Counts, PT 10/10/19 4:53 PM   [facility] [address] [place], [place], [address] Phone: [phone]   Fax:  [phone]  Name: Chloe Rodgers MRN: [id number] Date of Birth: [birth year]

## 2019-10-11 ENCOUNTER — Ambulatory Visit: Payer: Medicaid Other | Admitting: Physical Therapy

## 2019-11-01 ENCOUNTER — Ambulatory Visit: Payer: Medicaid Other | Attending: Nurse Practitioner | Admitting: Physical Therapy

## 2019-11-01 ENCOUNTER — Other Ambulatory Visit: Payer: Self-pay

## 2019-11-01 ENCOUNTER — Encounter: Payer: Self-pay | Admitting: Physical Therapy

## 2019-11-01 DIAGNOSIS — R32 Unspecified urinary incontinence: Secondary | ICD-10-CM | POA: Insufficient documentation

## 2019-11-01 DIAGNOSIS — R252 Cramp and spasm: Secondary | ICD-10-CM | POA: Diagnosis present

## 2019-11-01 DIAGNOSIS — G8929 Other chronic pain: Secondary | ICD-10-CM | POA: Diagnosis present

## 2019-11-01 DIAGNOSIS — M545 Low back pain, unspecified: Secondary | ICD-10-CM

## 2019-11-01 DIAGNOSIS — M6281 Muscle weakness (generalized): Secondary | ICD-10-CM | POA: Diagnosis present

## 2019-11-01 NOTE — Therapy (Signed)
[facility] [address], [address] [place], [place], [address] Phone: [phone]   Fax:  [phone]  Physical Therapy Treatment  Patient Details  Name: Chloe Rodgers MRN: [id number] Date of Birth: 15-Oct-1990 Referring Provider (PT): Carmel Sacramento, NP   Encounter Date: 11/01/2019  PT End of Session - 11/01/19 0832    Visit Number  12    Date for PT Re-Evaluation  01/28/20    Authorization Type  Medicaid    Authorization Time Period  08/16/2019-11/07/2019    Authorization - Visit Number  3    Authorization - Number of Visits  12    PT Start Time  0806   came late   PT Stop Time  0825   had to leave for pre-op testing   PT Time Calculation (min)  19 min    Activity Tolerance  Patient tolerated treatment well;No increased pain    Behavior During Therapy  WFL for tasks assessed/performed       Past Medical History:  Diagnosis Date  . Anemia   . Anxiety   . Chlamydia   . Complication of anesthesia    ??, seizure with wisdom teeth  . Depression    not on meds, sees a therapist  . Infection    UTI  . Kidney stone    kidney stones  . Migraine   . PID (acute pelvic inflammatory disease) 2014  . PONV (postoperative nausea and vomiting)   . Psoriasis   . Seizures [facility])    July 2013 - [place]    Past Surgical History:  Procedure Laterality Date  . CESAREAN SECTION    . CESAREAN SECTION  06/06/2012   Procedure: CESAREAN SECTION;  Surgeon: Adam Phenix, MD;  Location: [facility];  Service: Obstetrics;  Laterality: N/A;  . CESAREAN SECTION N/A 03/30/2017   Procedure: REPEAT CESAREAN SECTION;  Surgeon: Lazaro Arms, MD;  Location: [facility] BIRTHING SUITES;  Service: Obstetrics;  Laterality: N/A;  . SKIN GRAFT     off abd, onto arm  . WISDOM TOOTH EXTRACTION      There were no vitals filed for this visit.  Subjective Assessment - 11/01/19 0809    Subjective  Therapy has helped with pain management and daily activities.  Having the bladder distention on 4/28.    Patient Stated Goals  feel better and less pain    Currently in Pain?  Yes    Pain Score  8    no medicine   Pain Location  Pelvis    Pain Orientation  Lower;Mid    Pain Descriptors / Indicators  Dull;Constant;Sharp    Pain Type  Chronic pain    Pain Onset  More than a month ago    Pain Frequency  Constant    Aggravating Factors   sitting too long, standing too long, walking 5 minutes, penile penetration    Pain Relieving Factors  rest, medication    Multiple Pain Sites  Yes    Pain Score  8   not on medicine, 6/10 with med.   Pain Location  Back    Pain Orientation  Lower    Pain Descriptors / Indicators  Dull    Pain Type  Acute pain    Pain Onset  More than a month ago    Pain Frequency  Constant    Aggravating Factors   sitting or standing too long    Pain Relieving Factors  rest  [facility] PT Assessment - 11/01/19 0001      Assessment   Medical Diagnosis  R10.2 Chronic Pelvic pain in female    Referring Provider (PT)  Carmel Sacramento, NP    Onset Date/Surgical Date  --   chronic, 2012 estimate   Prior Therapy  yes      Precautions   Precautions  None      Restrictions   Weight Bearing Restrictions  No      Home Environment   Living Environment  Private residence      Prior Function   Level of Independence  Independent      Cognition   Overall Cognitive Status  Within Functional Limits for tasks assessed      Posture/Postural Control   Posture/Postural Control  Postural limitations    Postural Limitations  Rounded Shoulders;Forward head      ROM / Strength   AROM / PROM / Strength  AROM;PROM;Strength      PROM   Right Hip External Rotation   65    Left Hip External Rotation   60      Strength   Right Hip Extension  5/5    Right Hip ABduction  4/5    Right Hip ADduction  5/5    Left Hip Extension  4/5    Left Hip ABduction  4/5    Left Hip ADduction  5/5      Palpation   Palpation comment  tenderness  and fascial tightness in the lower abdomen due to bladder pain                Pelvic Floor Special Questions - 11/01/19 0001    Urinary Leakage  Yes    Pad use  wears 4-5 pads per day    Activities that cause leaking  Other;Coughing;Laughing;Sneezing    Other activities that cause leaking  night while laying down, standing up too long    Urinary frequency  trouble initiating urine and to use the bathroom    Fecal incontinence  No    Exam Type  Deferred   due to pain        [facility] Adult PT Treatment/Exercise - 11/01/19 0001      Manual Therapy   Manual Therapy  Myofascial release    Myofascial Release  to the urogenital diaphragm to release through the layers for pain relief               PT Short Term Goals - 02/21/19 1251      PT SHORT TERM GOAL #1   Title  ind with initial HEP    Time  4    Period  Weeks    Status  Achieved    Target Date  02/19/19      PT SHORT TERM GOAL #2   Title  pt will tolerate internal soft tissue assessment and treatment as needed    Period  Weeks    Status  Achieved    Target Date  02/19/19        PT Long Term Goals - 11/01/19 0813      PT LONG TERM GOAL #1   Title  pt will be ind with advanced HEP for pain management    Baseline  continues to have further education as she progresses    Time  12    Period  Weeks    Status  On-going      PT LONG TERM GOAL #2   Title  Pt will report 50% less pain during daily activities so she can perform tasks such as meal prep without assistance    Baseline  only when takes medicine and have physical therapy to reduce to 6/10 compared to 8/10    Time  12    Period  Weeks    Status  On-going      PT LONG TERM GOAL #3   Baseline  strength 4/5    Time  12    Period  Weeks    Status  On-going      PT LONG TERM GOAL #4   Title  pt will be able to perfrom functional transfers without increased pain due to increased core strength.    Baseline  only when takes medicine and physical  therapy to 6/10 compared to 8/10    Time  12    Period  Weeks    Status  On-going      PT LONG TERM GOAL #5   Title  able to have internal penetration to prepare for vaginal exam with pain level </= 3/10    Baseline  unable to have vaginal penetration due to pain level 7/10    Time  12    Period  Weeks    Status  New            Plan - 11/01/19 4970    Clinical Impression Statement  Patient is having a bladder distention procedure on 11/05/2019 for her pain which is at a level 8/10. When patient takes her pain medication it goes to 6/10 plus having physical therapy. Patient is able to manage her pain better with the medication and physical therapy. After this procedure patient should be able to manage her pain better with therapy and start doing more activities, walk furthur than 5 minutes, and take care of her 3 small children. Patient has full Lumbar ROM. Therapist unable to assess pelvic floor due to her pain being too high. Patient last pelvic floor assesment showed weakness. Patient has to wear 6-8 pads per day due to urinary leakage. Patient has difficulty with initiating urine stream due to pain and lack of coordination of the pelvic floor. Patient continues her current HEP for pain management. Patient medical hystory consists of Pelvic inflammatory disease, c-section 3x, and possible interstitial cystitis. Patient would benefit from continuing physical therapy to work on pain manage ment and progress her since she is having bladder distention surgery.    Personal Factors and Comorbidities  Comorbidity 1;Comorbidity 2;Time since onset of injury/illness/exacerbation;Fitness    Comorbidities  acute pelvic inflammatory disease, seizures    Examination-Activity Limitations  Bed Mobility;Locomotion Level;Transfers;Continence;Toileting    Examination-Participation Restrictions  Community Activity;Shop;Laundry;Meal Prep    Stability/Clinical Decision Making  Evolving/Moderate complexity     Rehab Potential  Good    PT Frequency  1x / week    PT Duration  12 weeks    PT Treatment/Interventions  Biofeedback;Cryotherapy;Electrical Stimulation;Moist Heat;Ultrasound;Therapeutic exercise;Therapeutic activities;Neuromuscular re-education;Patient/family education;Passive range of motion;Manual techniques;Spinal Manipulations;Joint Manipulations    PT Next Visit Plan  continue to work on core and pelvic floor strength with daily activities    PT Home Exercise Plan  Access Code: [email or web address]    Recommended Other Services  renewal note and medicaid on 4/22    Consulted and Agree with Plan of Care  Patient       Patient will benefit from skilled therapeutic intervention in order to improve the following deficits and impairments:  Decreased coordination, Decreased range of  motion, Increased fascial restricitons, Decreased endurance, Increased muscle spasms, Decreased activity tolerance, Pain, Decreased scar mobility, Impaired flexibility, Decreased mobility, Decreased strength  Visit Diagnosis: Muscle weakness (generalized) - Plan: PT plan of care cert/re-cert  Cramp and spasm - Plan: PT plan of care cert/re-cert  Chronic low back pain without sciatica, unspecified back pain laterality - Plan: PT plan of care cert/re-cert  Urinary incontinence, unspecified type - Plan: PT plan of care cert/re-cert     Problem List Patient Active Problem List   Diagnosis Date Noted  . Supervision of low-risk pregnancy 11/23/2018  . Chronic PID (chronic pelvic inflammatory disease) 12/13/2017  . Status post repeat low transverse cesarean section 03/30/2017  . Pelvic inflammatory disease (PID) 02/21/2013  . Migraines 07/24/2012  . Previous cesarean delivery, antepartum condition or complication 03/47/4259  . Obese 03/30/2012  . Seizure disorder ([place]) 02/03/2012  . Family history of breast cancer in mother 02/03/2012    Earlie Counts, PT 11/01/19 8:47 AM   [facility] [address], [place], [place], [address] Phone: [phone]   Fax:  [phone]  Name: Arien Morine MRN: [id number] Date of Birth: 12-17-1990

## 2019-11-06 ENCOUNTER — Encounter (HOSPITAL_COMMUNITY): Payer: Self-pay

## 2019-11-06 ENCOUNTER — Emergency Department (HOSPITAL_COMMUNITY)
Admission: EM | Admit: 2019-11-06 | Discharge: 2019-11-06 | Disposition: A | Discharge status: Home / Self Care | Payer: Medicaid Other | Attending: Emergency Medicine | Admitting: Emergency Medicine

## 2019-11-06 ENCOUNTER — Other Ambulatory Visit: Payer: Self-pay

## 2019-11-06 ENCOUNTER — Telehealth: Payer: Self-pay | Admitting: *Deleted

## 2019-11-06 DIAGNOSIS — R301 Vesical tenesmus: Secondary | ICD-10-CM | POA: Insufficient documentation

## 2019-11-06 DIAGNOSIS — Z79899 Other long term (current) drug therapy: Secondary | ICD-10-CM | POA: Diagnosis not present

## 2019-11-06 DIAGNOSIS — Z9889 Other specified postprocedural states: Secondary | ICD-10-CM | POA: Insufficient documentation

## 2019-11-06 DIAGNOSIS — R1030 Lower abdominal pain, unspecified: Secondary | ICD-10-CM | POA: Diagnosis present

## 2019-11-06 DIAGNOSIS — R10815 Periumbilic abdominal tenderness: Secondary | ICD-10-CM | POA: Insufficient documentation

## 2019-11-06 DIAGNOSIS — R300 Dysuria: Secondary | ICD-10-CM | POA: Diagnosis not present

## 2019-11-06 MED ORDER — PHENAZOPYRIDINE HCL 200 MG PO TABS: 200.0000 mg | ORAL_TABLET | Freq: Three times a day (TID) | ORAL | 0 refills | Status: DC

## 2019-11-06 MED ORDER — PHENAZOPYRIDINE HCL 200 MG PO TABS
200.0000 mg | ORAL_TABLET | Freq: Once | ORAL | Status: DC
  Filled 2019-11-06: qty 1

## 2019-11-06 NOTE — Discharge Instructions (Signed)
Please continue your home prescribed pain medications as directed.  I have prescribed Pyridium which is a medicine that will help sooth the bladder.  It will turn your urine orange, which is expected.  Please follow-up with your urologist and pain management doctor as planned.

## 2019-11-06 NOTE — ED Triage Notes (Signed)
Patient had cystoscopy yesterday and reports bladder pain following procedure. MD told her no pain meds and here requesting same, NAD

## 2019-11-06 NOTE — ED Notes (Addendum)
Pt did not want to wait for medication from pharmacy and states that she will pick up the prescription. Pt refused D/C vitals. Pt given D/C papers.

## 2019-11-06 NOTE — Telephone Encounter (Signed)
Pharmacy called related to Rx: pyridium...EDCM clarified with EDP ([place]) to change Rx to: AZO take 2 tablets TID .

## 2019-11-06 NOTE — ED Provider Notes (Signed)
[facility] EMERGENCY DEPARTMENT Provider Note   CSN: [id number] Arrival date & time: 11/06/19  1019     History No chief complaint on file.   Chloe Rodgers is a 29 y.o. female.  Patient with history of chronic pelvic pain on oxycodone tid and oxycodone ER bid, had a cystoscopy performed for diagnostic and therapeutic treatment of presumed interstitial cystitis --presents to the emergency department today with bladder pain and spasms.  She states that after her procedure yesterday she was feeling very well.  On review of procedure note, patient was given Marcaine and Pyridium in the bladder during the procedure.  Today she now has cramps, orange urine, and lower abdominal pain.  She denies any fevers, nausea or vomiting.  No back pain.  Onset of symptoms acute.  Course is constant.  Urination makes the pain worse.  Urology yesterday declined narcotic pain medication as she is seen by pain management for these chronic issues.  Patient states that she ran out a pain medication the day before surgery.  It appears at the patient was prescribed a 30-day course of her pain medication on 4/10, which should be sufficient until 11/18/2019.        Past Medical History:  Diagnosis Date  . Anemia   . Anxiety   . Chlamydia   . Complication of anesthesia    ??, seizure with wisdom teeth  . Depression    not on meds, sees a therapist  . Infection    UTI  . Kidney stone    kidney stones  . Migraine   . PID (acute pelvic inflammatory disease) 2014  . PONV (postoperative nausea and vomiting)   . Psoriasis   . Seizures [facility])    July 2013 - [place]    Patient Active Problem List   Diagnosis Date Noted  . Supervision of low-risk pregnancy 11/23/2018  . Chronic PID (chronic pelvic inflammatory disease) 12/13/2017  . Status post repeat low transverse cesarean section 03/30/2017  . Pelvic inflammatory disease (PID) 02/21/2013  . Migraines 07/24/2012  . Previous cesarean  delivery, antepartum condition or complication 03/30/2012  . Obese 03/30/2012  . Seizure disorder ([facility]) 02/03/2012  . Family history of breast cancer in mother 02/03/2012    Past Surgical History:  Procedure Laterality Date  . CESAREAN SECTION    . CESAREAN SECTION  06/06/2012   Procedure: CESAREAN SECTION;  Surgeon: Adam Phenix, MD;  Location: [facility];  Service: Obstetrics;  Laterality: N/A;  . CESAREAN SECTION N/A 03/30/2017   Procedure: REPEAT CESAREAN SECTION;  Surgeon: Lazaro Arms, MD;  Location: [facility] BIRTHING SUITES;  Service: Obstetrics;  Laterality: N/A;  . SKIN GRAFT     off abd, onto arm  . WISDOM TOOTH EXTRACTION       OB History    Gravida  6   Para  3   Term  3   Preterm  0   AB  2   Living  3     SAB  0   TAB  1   Ectopic  0   Multiple  0   Live Births  3           Family History  Problem Relation Age of Onset  . Hypertension Mother   . Diabetes Mother   . Cancer Mother        BREAST  . Stroke Mother   . Seizures Mother   . Asthma Daughter   . Hypertension Maternal Grandmother   .  Diabetes Maternal Grandmother   . Cancer Maternal Grandmother        BONE CANCER  . Other Neg Hx     Social History   Tobacco Use  . Smoking status: Never Smoker  . Smokeless tobacco: Never Used  Substance Use Topics  . Alcohol use: No    Comment: socially but none with pregnancy  . Drug use: No    Home Medications Prior to Admission medications   Medication Sig Start Date End Date Taking? Authorizing Provider  AMBULATORY NON FORMULARY MEDICATION 1 Device by Other route once a week. Medication Name: Blood pressure Cuff. Monitored regularly at home. DX:  Z34.90 11/23/18   Woodroe Mode, MD  clindamycin (CLEOCIN) 300 MG capsule Take 1 capsule (300 mg total) by mouth 2 (two) times daily. 05/05/18   Nat Christen, MD  docusate sodium (COLACE) 100 MG capsule Take 1 capsule (100 mg total) by mouth 2 (two) times daily. 11/27/18   Chancy Milroy, MD   doxycycline (VIBRAMYCIN) 100 MG capsule Take 1 capsule (100 mg total) by mouth 2 (two) times daily. 05/05/18   Nat Christen, MD  HYDROcodone-acetaminophen (NORCO/VICODIN) 5-325 MG tablet Take 1 tablet by mouth every 4 (four) hours as needed. 05/05/18   Nat Christen, MD  metroNIDAZOLE (METROGEL VAGINAL) 0.75 % vaginal gel Place applicator of medication into vagina every night for 5 nights. 09/02/18   Leftwich-Kirby, Kathie Dike, CNM  ondansetron (ZOFRAN) 4 MG tablet Take 1 tablet (4 mg total) by mouth every 6 (six) hours. 05/05/18   Nat Christen, MD  oxyCODONE HCl (ROXYBOND PO) Take 15 mg/day by mouth 1 day or 1 dose.    [provider]  phenazopyridine (PYRIDIUM) 200 MG tablet Take 1 tablet (200 mg total) by mouth 3 (three) times daily. 11/06/19   Carlisle Cater, PA-C  Prenatal 27-1 MG TABS Take 1 tablet by mouth daily. 11/23/18   Sloan Leiter, MD  promethazine (PHENERGAN) 25 MG tablet Take 1 tablet (25 mg total) by mouth every 6 (six) hours as needed for nausea or vomiting. 11/27/18   Chancy Milroy, MD    Allergies    Naproxen and Toradol [ketorolac tromethamine]  Review of Systems   Review of Systems  Constitutional: Negative for fever.  HENT: Negative for rhinorrhea and sore throat.   Eyes: Negative for redness.  Respiratory: Negative for cough.   Cardiovascular: Negative for chest pain.  Gastrointestinal: Positive for abdominal pain (Suprapubic). Negative for diarrhea, nausea and vomiting.  Genitourinary: Positive for dysuria. Negative for hematuria and urgency.  Musculoskeletal: Negative for myalgias.  Skin: Negative for rash.  Neurological: Negative for headaches.    Physical Exam Updated Vital Signs BP 133/73 (BP Location: Left Arm)   Pulse 93   Temp 98.2 F (36.8 C) (Oral)   Resp 18   Ht 5\' 6"  (1.676 m)   Wt 112 kg   SpO2 96%   BMI 39.87 kg/m   Physical Exam Vitals and nursing note reviewed.  Constitutional:      Appearance: She is well-developed.  HENT:      Head: Normocephalic and atraumatic.  Eyes:     Conjunctiva/sclera: Conjunctivae normal.  Pulmonary:     Effort: No respiratory distress.  Abdominal:     Tenderness: There is abdominal tenderness (Minimal, suprapubic). There is no guarding or rebound.  Musculoskeletal:     Cervical back: Normal range of motion and neck supple.  Skin:    General: Skin is warm and dry.  Neurological:  Mental Status: She is alert.     ED Results / Procedures / Treatments   Labs (all labs ordered are listed, but only abnormal results are displayed) Labs Reviewed - No data to display  EKG None  Radiology No results found.  Procedures Procedures (including critical care time)  Medications Ordered in ED Medications  phenazopyridine (PYRIDIUM) tablet 200 mg (has no administration in time range)    ED Course  I have reviewed the triage vital signs and the nursing notes.  Pertinent labs & imaging results that were available during my care of the patient were reviewed by me and considered in my medical decision making (see chart for details).  Patient seen and examined.  I reviewed procedure note from [facility] in care everywhere.  I will place patient on twice daily Pyridium for the next several days.  She states that she has seen her pain management doctor tomorrow.  Discussed with patient I cannot prescribe her administer any narcotic pain medications given her pain management status.  Encouraged follow-up with her urologist or return to the ED if she develops fever, severe pain, persistent vomiting.  Patient verbalizes understanding agrees with plan.  Vital signs reviewed and are as follows: BP 133/73 (BP Location: Left Arm)   Pulse 93   Temp 98.2 F (36.8 C) (Oral)   Resp 18   Ht 5\' 6"  (1.676 m)   Wt 112 kg   SpO2 96%   BMI 39.87 kg/m      MDM Rules/Calculators/A&P                      Patient with spasms and cramping after cystoscopy yesterday.  She has interstitial cystitis.   Abdominal exam is reassuring without signs of peritonitis.  No indication for further work-up at this time.  She looks well and is in no distress.  She is walking and moving without any difficulty.  We will treat symptoms asabove and have patient continue planned follow-up.    Final Clinical Impression(s) / ED Diagnoses Final diagnoses:  Painful bladder spasm    Rx / DC Orders ED Discharge Orders         Ordered    phenazopyridine (PYRIDIUM) 200 MG tablet  3 times daily     11/06/19 1107           11/08/19, PA-C 11/06/19 1115    11/08/19, MD 11/08/19 2205

## 2019-11-28 ENCOUNTER — Ambulatory Visit: Payer: Medicaid Other | Attending: Nurse Practitioner | Admitting: Physical Therapy

## 2019-12-03 ENCOUNTER — Ambulatory Visit: Payer: Medicaid Other | Admitting: Physical Therapy

## 2019-12-20 ENCOUNTER — Ambulatory Visit: Payer: Medicaid Other | Admitting: Physical Therapy

## 2019-12-26 ENCOUNTER — Ambulatory Visit: Payer: Medicaid Other | Admitting: Physical Therapy

## 2019-12-26 ENCOUNTER — Encounter: Payer: Self-pay | Admitting: Physical Therapy

## 2019-12-26 ENCOUNTER — Ambulatory Visit: Payer: Medicaid Other | Attending: Nurse Practitioner | Admitting: Physical Therapy

## 2019-12-26 ENCOUNTER — Other Ambulatory Visit: Payer: Self-pay

## 2019-12-26 DIAGNOSIS — M6281 Muscle weakness (generalized): Secondary | ICD-10-CM

## 2019-12-26 DIAGNOSIS — M545 Low back pain, unspecified: Secondary | ICD-10-CM

## 2019-12-26 DIAGNOSIS — G8929 Other chronic pain: Secondary | ICD-10-CM | POA: Diagnosis present

## 2019-12-26 DIAGNOSIS — R32 Unspecified urinary incontinence: Secondary | ICD-10-CM

## 2019-12-26 DIAGNOSIS — R252 Cramp and spasm: Secondary | ICD-10-CM

## 2019-12-26 NOTE — Therapy (Signed)
[facility] [address] [place], [place], [address] Phone: [phone]   Fax:  [phone]  Physical Therapy Treatment  Patient Details  Name: Chloe Rodgers MRN: [id number] Date of Birth: 24-Apr-1991 Referring Provider (PT): Carmel Sacramento, NP   Encounter Date: 12/26/2019   PT End of Session - 12/26/19 1537    Visit Number 13    Date for PT Re-Evaluation 01/28/20    Authorization Type Medicaid    Authorization Time Period 08/16/2019-11/07/2019    Authorization - Visit Number 4    Authorization - Number of Visits 12    PT Start Time 1530    PT Stop Time 1610    PT Time Calculation (min) 40 min    Activity Tolerance Patient tolerated treatment well;No increased pain    Behavior During Therapy WFL for tasks assessed/performed           Past Medical History:  Diagnosis Date  . Anemia   . Anxiety   . Chlamydia   . Complication of anesthesia    ??, seizure with wisdom teeth  . Depression    not on meds, sees a therapist  . Infection    UTI  . Kidney stone    kidney stones  . Migraine   . PID (acute pelvic inflammatory disease) 2014  . PONV (postoperative nausea and vomiting)   . Psoriasis   . Seizures [facility])    July 2013 - [place]    Past Surgical History:  Procedure Laterality Date  . CESAREAN SECTION    . CESAREAN SECTION  06/06/2012   Procedure: CESAREAN SECTION;  Surgeon: Adam Phenix, MD;  Location: [facility];  Service: Obstetrics;  Laterality: N/A;  . CESAREAN SECTION N/A 03/30/2017   Procedure: REPEAT CESAREAN SECTION;  Surgeon: Lazaro Arms, MD;  Location: [facility] BIRTHING SUITES;  Service: Obstetrics;  Laterality: N/A;  . SKIN GRAFT     off abd, onto arm  . WISDOM TOOTH EXTRACTION      There were no vitals filed for this visit.   Subjective Assessment - 12/26/19 1533    Subjective I am not able to come in due to my work schedule. I use the medicine for pain. I still get the throbbing in my  stomach. 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 I pulled my big toe nail up when it got caught on furniture. I wll go to urgent care after this.    Patient Stated Goals feel better and less pain    Currently in Pain? Yes    Pain Score 7     Pain Location Bladder    Pain Orientation Lower;Mid    Pain Descriptors / Indicators Dull;Constant;Sharp    Pain Type Chronic pain    Pain Onset More than a month ago    Pain Frequency Constant    Aggravating Factors  sitting too long, walking too much, getting up from laying down    Pain Relieving Factors rest, medication    Multiple Pain Sites Yes    Pain Score 8    Pain Location Back    Pain Orientation Lower    Pain Descriptors / Indicators Dull    Pain Type Acute pain    Pain Onset More than a month ago    Pain Frequency Constant    Aggravating Factors  sitting and standing too long    Pain Relieving Factors rest  [facility] PT Treatment/Exercise - 12/26/19 0001      Lumbar Exercises: Stretches   Single Knee to Chest Stretch Left;2 reps;10 seconds    Lower Trunk Rotation 4 reps;30 seconds    Lower Trunk Rotation Limitations in quadruped for thread the needle    Press Ups 10 reps      Lumbar Exercises: Supine   Pelvic Tilt 15 reps      Lumbar Exercises: Quadruped   [place]cat/Old Horse 15 reps      Manual Therapy   Manual Therapy Soft tissue mobilization;Myofascial release    Manual therapy comments to assess the area for dry needling; educated patient on how to perform release of her bladder    Soft tissue mobilization to lumbar paraspinals to elongate tissue after dry needling    Myofascial Release release around the bladder to increase tissue elongation            Trigger Point Dry Needling - 12/26/19 0001    Consent Given? Yes    Education Handout Provided Previously provided    Muscles Treated Back/Hip Lumbar multifidi    Lumbar multifidi Response Twitch response elicited;Palpable increased  muscle length                PT Education - 12/26/19 1608    Education Details how to release around the bladder to relieve pain    Person(s) Educated Patient    Methods Explanation;Demonstration    Comprehension Verbalized understanding;Returned demonstration            PT Short Term Goals - 02/21/19 1251      PT SHORT TERM GOAL #1   Title ind with initial HEP    Time 4    Period Weeks    Status Achieved    Target Date 02/19/19      PT SHORT TERM GOAL #2   Title pt will tolerate internal soft tissue assessment and treatment as needed    Period Weeks    Status Achieved    Target Date 02/19/19             PT Long Term Goals - 12/26/19 1535      PT LONG TERM GOAL #1   Title pt will be ind with advanced HEP for pain management    Baseline continues to have further education as she progresses    Time 12    Period Weeks    Status On-going      PT LONG TERM GOAL #2   Title Pt will report 50% less pain during daily activities so she can perform tasks such as meal prep without assistance    Baseline pain manages her medicine    Time 12    Period Weeks    Status On-going      PT LONG TERM GOAL #3   Title Pt will demonstrate 4+/5 MMT on bilateral hip flexion and abduction for improved upright posture during functional activities.    Baseline strength 4/5    Time 12    Period Weeks    Status On-going      PT LONG TERM GOAL #4   Title pt will be able to perfrom functional transfers without increased pain due to increased core strength.    Baseline only when takes medicine and physical therapy to 6/10 compared to 8/10    Time 12    Period Weeks    Status On-going      PT LONG TERM GOAL #5   Title able  to have internal penetration to prepare for vaginal exam with pain level </= 3/10    Baseline unable to have vaginal penetration due to pain level 7/10    Time 12    Period Weeks    Status On-going                 Plan - 12/26/19 1608    Clinical  Impression Statement Patient has not been in therapy due to her work schedule. She came in today with a toenail pulled almost off due to getting caught on furniture. Patient had an instillation in her bladder and contiues to have pain. She uses pain medication to manage pain. Today she was instructed on how to massage the area to release the restrictions to reduce her pain. Patient reports she is doing her current HEP and it helps. No change in strength or ROM due to not coming to therapy for several week. Patient will benefit from skilled therapy to work on pain management.    Personal Factors and Comorbidities Comorbidity 1;Comorbidity 2;Time since onset of injury/illness/exacerbation;Fitness    Comorbidities acute pelvic inflammatory disease, seizures    Examination-Activity Limitations Bed Mobility;Locomotion Level;Transfers;Continence;Toileting    Examination-Participation Restrictions Community Activity;Shop;Laundry;Meal Prep    Stability/Clinical Decision Making Evolving/Moderate complexity    Rehab Potential Good    PT Frequency 1x / week    PT Duration 12 weeks    PT Treatment/Interventions Biofeedback;Cryotherapy;Electrical Stimulation;Moist Heat;Ultrasound;Therapeutic exercise;Therapeutic activities;Neuromuscular re-education;Patient/family education;Passive range of motion;Manual techniques;Spinal Manipulations;Joint Manipulations    PT Next Visit Plan work on pain management    PT Home Exercise Plan Access Code: [email or web address]    Consulted and Agree with Plan of Care Patient           Patient will benefit from skilled therapeutic intervention in order to improve the following deficits and impairments:  Decreased coordination, Decreased range of motion, Increased fascial restricitons, Decreased endurance, Increased muscle spasms, Decreased activity tolerance, Pain, Decreased scar mobility, Impaired flexibility, Decreased mobility, Decreased strength  Visit Diagnosis: Muscle weakness  (generalized)  Cramp and spasm  Chronic low back pain without sciatica, unspecified back pain laterality  Urinary incontinence, unspecified type     Problem List Patient Active Problem List   Diagnosis Date Noted  . Supervision of low-risk pregnancy 11/23/2018  . Chronic PID (chronic pelvic inflammatory disease) 12/13/2017  . Status post repeat low transverse cesarean section 03/30/2017  . Pelvic inflammatory disease (PID) 02/21/2013  . Migraines 07/24/2012  . Previous cesarean delivery, antepartum condition or complication 03/30/2012  . Obese 03/30/2012  . Seizure disorder (HCC) 02/03/2012  . Family history of breast cancer in mother 02/03/2012    Eulis Foster, PT 12/26/19 4:12 PM   [facility] [address] [place], [place], [address] Phone: [phone]   Fax:  [phone]  Name: Curstin Schmale MRN: [id number] Date of Birth: Oct 31, 1990

## 2019-12-27 ENCOUNTER — Ambulatory Visit (HOSPITAL_COMMUNITY)
Admission: EM | Admit: 2019-12-27 | Discharge: 2019-12-27 | Disposition: A | Discharge status: Home / Self Care | Payer: Medicaid Other | Attending: Internal Medicine | Admitting: Internal Medicine

## 2019-12-27 ENCOUNTER — Encounter (HOSPITAL_COMMUNITY): Payer: Self-pay

## 2019-12-27 ENCOUNTER — Ambulatory Visit (INDEPENDENT_AMBULATORY_CARE_PROVIDER_SITE_OTHER): Payer: Medicaid Other

## 2019-12-27 DIAGNOSIS — M79674 Pain in right toe(s): Secondary | ICD-10-CM

## 2019-12-27 DIAGNOSIS — S91209A Unspecified open wound of unspecified toe(s) with damage to nail, initial encounter: Secondary | ICD-10-CM | POA: Diagnosis not present

## 2019-12-27 MED ORDER — BACITRACIN ZINC 500 UNIT/GM EX OINT: 1.0000 "application " | TOPICAL_OINTMENT | Freq: Two times a day (BID) | CUTANEOUS | 0 refills | Status: DC

## 2019-12-27 MED ORDER — LIDOCAINE HCL (PF) 1 % IJ SOLN
INTRAMUSCULAR | Status: AC
  Filled 2019-12-27: qty 30

## 2019-12-27 MED ORDER — ACETAMINOPHEN 325 MG PO TABS: 650.0000 mg | ORAL_TABLET | Freq: Four times a day (QID) | ORAL | Status: DC | PRN

## 2019-12-27 NOTE — ED Triage Notes (Signed)
Pt states she was moving furniture yesterday and stubbed her 1st right digit of foot. Pt's toe nail is lifted all the way up from bed. Pt c/o numbness and tingling of toe. Bleeding is controlled.

## 2019-12-27 NOTE — ED Provider Notes (Signed)
[facility]    CSN: [id number] Arrival date & time: 12/27/19  3790      History   Chief Complaint Chief Complaint  Patient presents with  . Toe Injury    HPI Chloe Rodgers is a 29 y.o. female.   HPI  Accidentally kicked a piece of furniture last night.   she controlled the bleeding.  She put antibiotic ointment on it .  Her toenail is "standing on end".  She feels like her toe may be broken. She states she is otherwise well, has no additional complaints  Past Medical History:  Diagnosis Date  . Anemia   . Anxiety   . Chlamydia   . Complication of anesthesia    ??, seizure with wisdom teeth  . Depression    not on meds, sees a therapist  . Infection    UTI  . Kidney stone    kidney stones  . Migraine   . PID (acute pelvic inflammatory disease) 2014  . PONV (postoperative nausea and vomiting)   . Psoriasis   . Seizures [facility])    July 2013 - [place]    Patient Active Problem List   Diagnosis Date Noted  . Supervision of low-risk pregnancy 11/23/2018  . Chronic PID (chronic pelvic inflammatory disease) 12/13/2017  . Status post repeat low transverse cesarean section 03/30/2017  . Pelvic inflammatory disease (PID) 02/21/2013  . Migraines 07/24/2012  . Previous cesarean delivery, antepartum condition or complication 03/30/2012  . Obese 03/30/2012  . Seizure disorder (HCC) 02/03/2012  . Family history of breast cancer in mother 02/03/2012    Past Surgical History:  Procedure Laterality Date  . CESAREAN SECTION    . CESAREAN SECTION  06/06/2012   Procedure: CESAREAN SECTION;  Surgeon: Adam Phenix, MD;  Location: [facility];  Service: Obstetrics;  Laterality: N/A;  . CESAREAN SECTION N/A 03/30/2017   Procedure: REPEAT CESAREAN SECTION;  Surgeon: Lazaro Arms, MD;  Location: [facility] BIRTHING SUITES;  Service: Obstetrics;  Laterality: N/A;  . SKIN GRAFT     off abd, onto arm  . WISDOM TOOTH EXTRACTION      OB History    Gravida  6   Para  3    Term  3   Preterm  0   AB  2   Living  3     SAB  0   TAB  1   Ectopic  0   Multiple  0   Live Births  3            Home Medications    Prior to Admission medications   Medication Sig Start Date End Date Taking? Authorizing Provider  AMBULATORY NON FORMULARY MEDICATION 1 Device by Other route once a week. Medication Name: Blood pressure Cuff. Monitored regularly at home. DX:  Z34.90 11/23/18   Adam Phenix, MD  clindamycin (CLEOCIN) 300 MG capsule Take 1 capsule (300 mg total) by mouth 2 (two) times daily. 05/05/18   Donnetta Hutching, MD  docusate sodium (COLACE) 100 MG capsule Take 1 capsule (100 mg total) by mouth 2 (two) times daily. 11/27/18   Hermina Staggers, MD  doxycycline (VIBRAMYCIN) 100 MG capsule Take 1 capsule (100 mg total) by mouth 2 (two) times daily. 05/05/18   Donnetta Hutching, MD  HYDROcodone-acetaminophen (NORCO/VICODIN) 5-325 MG tablet Take 1 tablet by mouth every 4 (four) hours as needed. 05/05/18   Donnetta Hutching, MD  metroNIDAZOLE (METROGEL VAGINAL) 0.75 % vaginal gel Place applicator of medication  into vagina every night for 5 nights. 09/02/18   Leftwich-Kirby, Wilmer Floor, CNM  ondansetron (ZOFRAN) 4 MG tablet Take 1 tablet (4 mg total) by mouth every 6 (six) hours. 05/05/18   Donnetta Hutching, MD  oxyCODONE HCl (ROXYBOND PO) Take 15 mg/day by mouth 1 day or 1 dose.    [provider]  phenazopyridine (PYRIDIUM) 200 MG tablet Take 1 tablet (200 mg total) by mouth 3 (three) times daily. 11/06/19   Renne Crigler, PA-C  Prenatal 27-1 MG TABS Take 1 tablet by mouth daily. 11/23/18   Conan Bowens, MD  promethazine (PHENERGAN) 25 MG tablet Take 1 tablet (25 mg total) by mouth every 6 (six) hours as needed for nausea or vomiting. 11/27/18   Hermina Staggers, MD    Family History Family History  Problem Relation Age of Onset  . Hypertension Mother   . Diabetes Mother   . Cancer Mother        BREAST  . Stroke Mother   . Seizures Mother   . Asthma Daughter   .  Hypertension Maternal Grandmother   . Diabetes Maternal Grandmother   . Cancer Maternal Grandmother        BONE CANCER  . Other Neg Hx     Social History Social History   Tobacco Use  . Smoking status: Never Smoker  . Smokeless tobacco: Never Used  Vaping Use  . Vaping Use: Never used  Substance Use Topics  . Alcohol use: Yes    Comment: occ  . Drug use: No     Allergies   Naproxen and Toradol [ketorolac tromethamine]   Review of Systems Review of Systems   Physical Exam Triage Vital Signs ED Triage Vitals  Enc Vitals Group     BP 12/27/19 0908 132/81     Pulse Rate 12/27/19 0908 77     Resp 12/27/19 0908 16     Temp 12/27/19 0908 97.7 F (36.5 C)     Temp Source 12/27/19 0908 Oral     SpO2 12/27/19 0908 98 %     Weight 12/27/19 0909 235 lb (106.6 kg)     Height 12/27/19 0909 5\' 6"  (1.676 m)     Head Circumference --      Peak Flow --      Pain Score 12/27/19 0908 9     Pain Loc --      Pain Edu? --      Excl. in GC? --    No data found.  Updated Vital Signs BP 132/81   Pulse 77   Temp 97.7 F (36.5 C) (Oral)   Resp 16   Ht 5\' 6"  (1.676 m)   Wt 106.6 kg   LMP 12/13/2019   SpO2 98%   BMI 37.93 kg/m       Physical Exam   UC Treatments / Results  Labs (all labs ordered are listed, but only abnormal results are displayed) Labs Reviewed - No data to display  EKG   Radiology DG Toe Great Right  Result Date: 12/27/2019 CLINICAL DATA:  Great toe pain around the toenail. EXAM: RIGHT GREAT TOE COMPARISON:  None. FINDINGS: Up lifted toenail. No acute fracture or dislocation. No opaque foreign body. IMPRESSION: Up lifted toenail without acute fracture or dislocation. Electronically Signed   By: 02/12/2020 M.D.   On: 12/27/2019 10:23    Procedures Procedures (including critical care time)  Medications Ordered in UC Medications - No data to display  Initial Impression /  Assessment and Plan / UC Course  I have reviewed the triage vital  signs and the nursing notes.  Pertinent labs & imaging results that were available during my care of the patient were reviewed by me and considered in my medical decision making (see chart for details).     Patient was checked out to another provider for discharge Final Clinical Impressions(s) / UC Diagnoses   Final diagnoses:  None   Discharge Instructions   None    ED Prescriptions    None     PDMP not reviewed this encounter.   Raylene Everts, MD 01/07/20 [phone]

## 2019-12-27 NOTE — Discharge Instructions (Addendum)
Daily wound dressing changes with topical antibiotics. Return to urgent care if you notice any swelling, worsening pain, purulent discharge and or swelling of the right great toe.

## 2019-12-29 ENCOUNTER — Encounter (HOSPITAL_COMMUNITY): Payer: Self-pay

## 2019-12-29 ENCOUNTER — Ambulatory Visit (HOSPITAL_COMMUNITY)
Admission: EM | Admit: 2019-12-29 | Discharge: 2019-12-29 | Disposition: A | Discharge status: Home / Self Care | Payer: Medicaid Other | Attending: Family Medicine | Admitting: Family Medicine

## 2019-12-29 DIAGNOSIS — S91209D Unspecified open wound of unspecified toe(s) with damage to nail, subsequent encounter: Secondary | ICD-10-CM | POA: Diagnosis not present

## 2019-12-29 NOTE — ED Provider Notes (Signed)
[facility]    CSN: [id number] Arrival date & time: 12/29/19  1035      History   Chief Complaint Chief Complaint  Patient presents with  . Foot Pain    HPI Chloe Rodgers is a 29 y.o. female.   She is following up after avulsion of her right great toenail.  This was dressed on 6/17.  Has not changed her dressing as of yet.  Having some pain as she is walking on the toe itself.  HPI  Past Medical History:  Diagnosis Date  . Anemia   . Anxiety   . Chlamydia   . Complication of anesthesia    ??, seizure with wisdom teeth  . Depression    not on meds, sees a therapist  . Infection    UTI  . Kidney stone    kidney stones  . Migraine   . PID (acute pelvic inflammatory disease) 2014  . PONV (postoperative nausea and vomiting)   . Psoriasis   . Seizures [facility])    July 2013 - [place]    Patient Active Problem List   Diagnosis Date Noted  . Supervision of low-risk pregnancy 11/23/2018  . Chronic PID (chronic pelvic inflammatory disease) 12/13/2017  . Status post repeat low transverse cesarean section 03/30/2017  . Pelvic inflammatory disease (PID) 02/21/2013  . Migraines 07/24/2012  . Previous cesarean delivery, antepartum condition or complication 03/30/2012  . Obese 03/30/2012  . Seizure disorder (HCC) 02/03/2012  . Family history of breast cancer in mother 02/03/2012    Past Surgical History:  Procedure Laterality Date  . CESAREAN SECTION    . CESAREAN SECTION  06/06/2012   Procedure: CESAREAN SECTION;  Surgeon: Adam Phenix, MD;  Location: [facility];  Service: Obstetrics;  Laterality: N/A;  . CESAREAN SECTION N/A 03/30/2017   Procedure: REPEAT CESAREAN SECTION;  Surgeon: Lazaro Arms, MD;  Location: [facility] BIRTHING SUITES;  Service: Obstetrics;  Laterality: N/A;  . SKIN GRAFT     off abd, onto arm  . WISDOM TOOTH EXTRACTION      OB History    Gravida  6   Para  3   Term  3   Preterm  0   AB  2   Living  3     SAB  0   TAB  1    Ectopic  0   Multiple  0   Live Births  3            Home Medications    Prior to Admission medications   Medication Sig Start Date End Date Taking? Authorizing Provider  acetaminophen (TYLENOL) 325 MG tablet Take 2 tablets (650 mg total) by mouth every 6 (six) hours as needed. 12/27/19   Lamptey, Britta Mccreedy, MD  bacitracin ointment Apply 1 application topically 2 (two) times daily. 12/27/19   Merrilee Jansky, MD  promethazine (PHENERGAN) 25 MG tablet Take 1 tablet (25 mg total) by mouth every 6 (six) hours as needed for nausea or vomiting. 11/27/18 12/27/19  Hermina Staggers, MD    Family History Family History  Problem Relation Age of Onset  . Hypertension Mother   . Diabetes Mother   . Cancer Mother        BREAST  . Stroke Mother   . Seizures Mother   . Asthma Daughter   . Hypertension Maternal Grandmother   . Diabetes Maternal Grandmother   . Cancer Maternal Grandmother        BONE  CANCER  . Other Neg Hx     Social History Social History   Tobacco Use  . Smoking status: Never Smoker  . Smokeless tobacco: Never Used  Vaping Use  . Vaping Use: Never used  Substance Use Topics  . Alcohol use: Yes    Comment: occ  . Drug use: No     Allergies   Naproxen and Toradol [ketorolac tromethamine]   Review of Systems Review of Systems  See HPI  Physical Exam Triage Vital Signs ED Triage Vitals  Enc Vitals Group     BP 12/29/19 1111 (!) 133/91     Pulse Rate 12/29/19 1111 78     Resp 12/29/19 1111 16     Temp 12/29/19 1111 98.1 F (36.7 C)     Temp Source 12/29/19 1111 Oral     SpO2 12/29/19 1111 99 %     Weight 12/29/19 1112 235 lb (106.6 kg)     Height 12/29/19 1112 5\' 6"  (1.676 m)     Head Circumference --      Peak Flow --      Pain Score 12/29/19 1111 8     Pain Loc --      Pain Edu? --      Excl. in Cylinder? --    No data found.  Updated Vital Signs BP (!) 133/91   Pulse 78   Temp 98.1 F (36.7 C) (Oral)   Resp 16   Ht 5\' 6"  (1.676 m)   Wt  106.6 kg   LMP 12/13/2019   SpO2 99%   BMI 37.93 kg/m   Visual Acuity Right Eye Distance:   Left Eye Distance:   Bilateral Distance:    Right Eye Near:   Left Eye Near:    Bilateral Near:     Physical Exam Gen: NAD, alert, cooperative with exam, well-appearing Neuro: normal tone, normal sensation to touch Psych:  normal insight, alert and oriented MSK:  Right great toe: Avulsion appreciated of the right great toenail. Tenderness around this area. No streaking. Neurovascular intact       UC Treatments / Results  Labs (all labs ordered are listed, but only abnormal results are displayed) Labs Reviewed - No data to display  EKG   Radiology No results found.  Procedures Procedures (including critical care time)  Medications Ordered in UC Medications - No data to display  Initial Impression / Assessment and Plan / UC Course  I have reviewed the triage vital signs and the nursing notes.  Pertinent labs & imaging results that were available during my care of the patient were reviewed by me and considered in my medical decision making (see chart for details).     Chloe Rodgers is a 29 year old female who is following up for her right great toe avulsion.  This was redressed today.  Does not appear to be infected.  Provided postop shoe.  Counseled on supportive care.  Given indications to follow-up and return.  Final Clinical Impressions(s) / UC Diagnoses   Final diagnoses:  Traumatic avulsion of nail plate of toe, subsequent encounter     Discharge Instructions     Please try to change the dressing on a daily basis  Please follow up if your symptoms change  Please use the post op shoe if pain is still ongoing.      ED Prescriptions    None     PDMP not reviewed this encounter.   Rosemarie Ax, MD 12/29/19 [phone]

## 2019-12-29 NOTE — Discharge Instructions (Signed)
Please try to change the dressing on a daily basis  Please follow up if your symptoms change  Please use the post op shoe if pain is still ongoing.

## 2019-12-29 NOTE — ED Triage Notes (Signed)
Pt is here for a f/u for her toe. Pt states she needs her badage changed and wants a cam boot or something to walk with, because she is having difficulty w/ambulation.

## 2019-12-30 NOTE — ED Provider Notes (Addendum)
[facility]    CSN: [id number] Arrival date & time: 12/27/19  0836      History   Chief Complaint Chief Complaint  Patient presents with  . Toe Injury    HPI Chloe Rodgers is a 29 y.o. female comes to the urgent care for right toe pain started after she kicked a piece of furniture last night.  Pain is severe, constant, she has not tried any over-the-counter medications, no swelling and no known relieving factors.  Her toenail is "standing on end".   HPI  Past Medical History:  Diagnosis Date  . Anemia   . Anxiety   . Chlamydia   . Complication of anesthesia    ??, seizure with wisdom teeth  . Depression    not on meds, sees a therapist  . Infection    UTI  . Kidney stone    kidney stones  . Migraine   . PID (acute pelvic inflammatory disease) 2014  . PONV (postoperative nausea and vomiting)   . Psoriasis   . Seizures [facility])    July 2013 - [place]    Patient Active Problem List   Diagnosis Date Noted  . Supervision of low-risk pregnancy 11/23/2018  . Chronic PID (chronic pelvic inflammatory disease) 12/13/2017  . Status post repeat low transverse cesarean section 03/30/2017  . Pelvic inflammatory disease (PID) 02/21/2013  . Migraines 07/24/2012  . Previous cesarean delivery, antepartum condition or complication 03/30/2012  . Obese 03/30/2012  . Seizure disorder (HCC) 02/03/2012  . Family history of breast cancer in mother 02/03/2012    Past Surgical History:  Procedure Laterality Date  . CESAREAN SECTION    . CESAREAN SECTION  06/06/2012   Procedure: CESAREAN SECTION;  Surgeon: Adam Phenix, MD;  Location: [facility];  Service: Obstetrics;  Laterality: N/A;  . CESAREAN SECTION N/A 03/30/2017   Procedure: REPEAT CESAREAN SECTION;  Surgeon: Lazaro Arms, MD;  Location: [facility];  Service: Obstetrics;  Laterality: N/A;  . SKIN GRAFT     off abd, onto arm  . WISDOM TOOTH EXTRACTION      OB History    Gravida  6   Para  3    Term  3   Preterm  0   AB  2   Living  3     SAB  0   TAB  1   Ectopic  0   Multiple  0   Live Births  3            Home Medications    Prior to Admission medications   Medication Sig Start Date End Date Taking? Authorizing Provider  acetaminophen (TYLENOL) 325 MG tablet Take 2 tablets (650 mg total) by mouth every 6 (six) hours as needed. 12/27/19   Charlyne Robertshaw, Britta Mccreedy, MD  bacitracin ointment Apply 1 application topically 2 (two) times daily. 12/27/19   Merrilee Jansky, MD  promethazine (PHENERGAN) 25 MG tablet Take 1 tablet (25 mg total) by mouth every 6 (six) hours as needed for nausea or vomiting. 11/27/18 12/27/19  Hermina Staggers, MD    Family History Family History  Problem Relation Age of Onset  . Hypertension Mother   . Diabetes Mother   . Cancer Mother        BREAST  . Stroke Mother   . Seizures Mother   . Asthma Daughter   . Hypertension Maternal Grandmother   . Diabetes Maternal Grandmother   . Cancer Maternal Grandmother  BONE CANCER  . Other Neg Hx     Social History Social History   Tobacco Use  . Smoking status: Never Smoker  . Smokeless tobacco: Never Used  Vaping Use  . Vaping Use: Never used  Substance Use Topics  . Alcohol use: Yes    Comment: occ  . Drug use: No     Allergies   Naproxen and Toradol [ketorolac tromethamine]   Review of Systems Review of Systems  Musculoskeletal: Positive for arthralgias and joint swelling.  Skin: Positive for color change and wound.  Neurological: Negative for dizziness, facial asymmetry and light-headedness.     Physical Exam Triage Vital Signs ED Triage Vitals  Enc Vitals Group     BP 12/27/19 0908 132/81     Pulse Rate 12/27/19 0908 77     Resp 12/27/19 0908 16     Temp 12/27/19 0908 97.7 F (36.5 C)     Temp Source 12/27/19 0908 Oral     SpO2 12/27/19 0908 98 %     Weight 12/27/19 0909 235 lb (106.6 kg)     Height 12/27/19 0909 5\' 6"  (1.676 m)     Head Circumference  --      Peak Flow --      Pain Score 12/27/19 0908 9     Pain Loc --      Pain Edu? --      Excl. in GC? --    No data found.  Updated Vital Signs BP 132/81   Pulse 77   Temp 97.7 F (36.5 C) (Oral)   Resp 16   Ht 5\' 6"  (1.676 m)   Wt 106.6 kg   LMP 12/13/2019   SpO2 98%   BMI 37.93 kg/m   Visual Acuity Right Eye Distance:   Left Eye Distance:   Bilateral Distance:    Right Eye Near:   Left Eye Near:    Bilateral Near:     Physical Exam Vitals and nursing note reviewed.  Constitutional:      Appearance: Normal appearance.  Skin:    Comments: The body of the right great toe is avulsed from the nail bed and nail root still firmly attached to nail fold.  Nail matrix appears intact.  Neurological:     Mental Status: She is alert.      UC Treatments / Results  Labs (all labs ordered are listed, but only abnormal results are displayed) Labs Reviewed - No data to display  EKG   Radiology No results found.  Procedures Nail Removal  Date/Time: 12/30/2019 9:03 AM Performed by: 02/12/2020, MD Authorized by: 01/01/2020, MD   Consent:    Consent obtained:  Verbal   Consent given by:  Patient   Risks discussed:  Bleeding and infection   Alternatives discussed:  No treatment and delayed treatment Location:    Foot:  R big toe Pre-procedure details:    Skin preparation:  Betadine Anesthesia (see MAR for exact dosages):    Anesthesia method:  Local infiltration   Local anesthetic:  Lidocaine 1% w/o epi Nail Removal:    Nail removed:  Partial   Stented with:  Body of the nail was removed down to the level of the eponychium Ingrown nail:    Nail matrix removed or ablated:  None Post-procedure details:    Dressing:  Antibiotic ointment and non-adhesive packing strip   Patient tolerance of procedure:  Tolerated well, no immediate complications   (including critical care time)  Medications Ordered in UC Medications - No data to  display  Initial Impression / Assessment and Plan / UC Course  I have reviewed the triage vital signs and the nursing notes.  Pertinent labs & imaging results that were available during my care of the patient were reviewed by me and considered in my medical decision making (see chart for details).     1.  Traumatic avulsion of the body of the nail: Partial toenail removal: Antibiotic cream application Daily wound dressing changes with antibiotic ointment. Okay for mild soap and water after 24 hours Tylenol/Motrin for pain Return to urgent care if symptoms worsen. Final Clinical Impressions(s) / UC Diagnoses   Final diagnoses:  Traumatic avulsion of nail plate of toe, initial encounter     Discharge Instructions     Daily wound dressing changes with topical antibiotics. Return to urgent care if you notice any swelling, worsening pain, purulent discharge and or swelling of the right great toe.   ED Prescriptions    Medication Sig Dispense Auth. Provider   acetaminophen (TYLENOL) 325 MG tablet Take 2 tablets (650 mg total) by mouth every 6 (six) hours as needed.  Chase Picket, MD   bacitracin ointment Apply 1 application topically 2 (two) times daily. 120 g Shermaine Brigham, Myrene Galas, MD     PDMP not reviewed this encounter.   Chase Picket, MD 12/30/19 6546    Chase Picket, MD 12/30/19 [phone]

## 2020-01-22 ENCOUNTER — Telehealth: Payer: Self-pay | Admitting: Genetic Counselor

## 2020-01-22 NOTE — Telephone Encounter (Signed)
Received a genetic counseling referral from the pt's pcp for fhx of breast cancer. Chloe Rodgers has been cld and scheduled to see Clydie Braun on 8/4 at 9am. Pt aware to arrive 15 minutes early.

## 2020-01-24 ENCOUNTER — Other Ambulatory Visit: Payer: Self-pay

## 2020-01-24 ENCOUNTER — Encounter: Payer: Self-pay | Admitting: Physical Therapy

## 2020-01-24 ENCOUNTER — Ambulatory Visit: Payer: Medicaid Other | Attending: Nurse Practitioner | Admitting: Physical Therapy

## 2020-01-24 DIAGNOSIS — R32 Unspecified urinary incontinence: Secondary | ICD-10-CM | POA: Insufficient documentation

## 2020-01-24 DIAGNOSIS — M6281 Muscle weakness (generalized): Secondary | ICD-10-CM | POA: Diagnosis present

## 2020-01-24 DIAGNOSIS — G8929 Other chronic pain: Secondary | ICD-10-CM | POA: Insufficient documentation

## 2020-01-24 DIAGNOSIS — R252 Cramp and spasm: Secondary | ICD-10-CM

## 2020-01-24 DIAGNOSIS — M545 Low back pain: Secondary | ICD-10-CM | POA: Insufficient documentation

## 2020-01-24 NOTE — Therapy (Addendum)
[facility] [address] [place], [place], [address] Phone: [phone]   Fax:  [phone]  Physical Therapy Treatment  Patient Details  Name: Chloe Rodgers MRN: [id number] Date of Birth: Jun 05, 1991 Referring Provider (PT): Emelia Loron, NP   Encounter Date: 01/24/2020   PT End of Session - 01/24/20 1658    Visit Number 14    Date for PT Re-Evaluation 01/28/20    Authorization Type Medicaid    Authorization - Visit Number 5    Authorization - Number of Visits 12    PT Start Time 6808   came late   PT Stop Time 8110    PT Time Calculation (min) 38 min    Activity Tolerance Patient tolerated treatment well;No increased pain    Behavior During Therapy WFL for tasks assessed/performed           Past Medical History:  Diagnosis Date  . Anemia   . Anxiety   . Chlamydia   . Complication of anesthesia    ??, seizure with wisdom teeth  . Depression    not on meds, sees a therapist  . Infection    UTI  . Kidney stone    kidney stones  . Migraine   . PID (acute pelvic inflammatory disease) 2014  . PONV (postoperative nausea and vomiting)   . Psoriasis   . Seizures [facility])    July 2013 - [place]    Past Surgical History:  Procedure Laterality Date  . CESAREAN SECTION    . CESAREAN SECTION  06/06/2012   Procedure: CESAREAN SECTION;  Surgeon: Woodroe Mode, MD;  Location: [place] [facility];  Service: Obstetrics;  Laterality: N/A;  . CESAREAN SECTION N/A 03/30/2017   Procedure: REPEAT CESAREAN SECTION;  Surgeon: Florian Buff, MD;  Location: [place];  Service: Obstetrics;  Laterality: N/A;  . SKIN GRAFT     off abd, onto arm  . WISDOM TOOTH EXTRACTION      There were no vitals filed for this visit.   Subjective Assessment - 01/24/20 1623    Subjective The pain is manageable  with the exercise and medication. 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 The work keeps me active.    Patient Stated Goals  feel better and less pain    Currently in Pain? Yes    Pain Score 7     Pain Location Abdomen    Pain Orientation Anterior    Pain Descriptors / Indicators Dull;Constant    Pain Type Chronic pain    Pain Onset More than a month ago    Pain Frequency Constant    Aggravating Factors  doing too much, sitting too long, walk long distance, morning when getting out of bed, getting in and out of the bed    Pain Relieving Factors rest, medication    Pain Score 8    Pain Location Back    Pain Orientation Lower    Pain Descriptors / Indicators Dull    Pain Type Chronic pain    Pain Onset More than a month ago    Pain Frequency Constant    Aggravating Factors  sitting and standing too long    Pain Relieving Factors rest              [facility] PT Assessment - 01/24/20 0001      Assessment   Medical Diagnosis R10.2 Chronic Pelvic pain in female    Referring Provider (PT) Emelia Loron, NP  Onset Date/Surgical Date --   chronic, 2012 estimate   Prior Therapy yes      Precautions   Precautions None      Restrictions   Weight Bearing Restrictions No      Home Environment   Living Environment Private residence      Prior Function   Level of Independence Independent      Cognition   Overall Cognitive Status Within Functional Limits for tasks assessed      Posture/Postural Control   Posture/Postural Control Postural limitations    Postural Limitations Rounded Shoulders;Forward head      AROM   Lumbar Flexion full    Lumbar Extension full    Lumbar - Right Side Bend full    Lumbar - Left Side Bend full    Lumbar - Right Rotation full    Lumbar - Left Rotation full      PROM   Right Hip External Rotation  65    Left Hip External Rotation  60      Strength   Right Hip Extension 5/5    Right Hip ABduction 5/5    Right Hip ADduction 5/5    Left Hip Extension 5/5    Left Hip ABduction 5/5    Left Hip ADduction 5/5                         [facility] PT  Treatment/Exercise - 01/24/20 0001      Lumbar Exercises: Aerobic   Nustep 8 min. level 3 while assessing patient      Lumbar Exercises: Supine   Heel Slides 10 reps    Heel Slides Limitations each side with abdominal contraction with no sharp pain    Bent Knee Raise 10 reps;1 second    Bent Knee Raise Limitations gave patient sharp pains in pubic bone    Other Supine Lumbar Exercises supine bil. shoulder flexion with red band 15x                  PT Education - 01/24/20 1655    Education Details Access Code: [email or web address]    Person(s) Educated Patient    Methods Explanation;Demonstration;Verbal cues;Handout    Comprehension Returned demonstration;Verbalized understanding            PT Short Term Goals - 01/24/20 1656      PT SHORT TERM GOAL #1   Title ind with initial HEP    Period Weeks    Status Achieved    Target Date 02/19/19      PT SHORT TERM GOAL #2   Title pt will tolerate internal soft tissue assessment and treatment as needed    Time 4    Period Weeks    Status Achieved    Target Date 02/19/19             PT Long Term Goals - 01/24/20 1656      PT LONG TERM GOAL #1   Title pt will be ind with advanced HEP for pain management    Time 12    Period Weeks    Status Achieved      PT LONG TERM GOAL #2   Title Pt will report 50% less pain during daily activities so she can perform tasks such as meal prep without assistance    Baseline when taking medication and exercise    Time 12    Period Weeks    Status Achieved  PT LONG TERM GOAL #3   Title Pt will demonstrate 4+/5 MMT on bilateral hip flexion and abduction for improved upright posture during functional activities.    Time 12    Period Weeks    Status Achieved      PT LONG TERM GOAL #4   Baseline as long as she takes her medication and exercise    Time 12    Period Weeks    Status Achieved      PT LONG TERM GOAL #5   Title able to have internal penetration to prepare for vaginal  exam with pain level </= 3/10    Baseline unable to have vaginal penetration due to pain level 7/10    Time 12    Period Weeks    Status Deferred                 Plan - 01/24/20 1631    Clinical Impression Statement Patient is able to manage her pain with medication and exercise. Patient is now working and moving around more. Patient does her HEP 2 times per day. Patient is able to engage her core to exercise. Patient continues to have urinary leakage but as she continues to exercise she will get stronger. Her HEP was advanced today. Patient has incresaed strength of her hips. Patient is able to move with greater ease as long as she is taking pain medication and exercise. Patient is ready for discharge.    Personal Factors and Comorbidities Comorbidity 1;Comorbidity 2;Time since onset of injury/illness/exacerbation;Fitness    Comorbidities acute pelvic inflammatory disease, seizures    Examination-Activity Limitations Bed Mobility;Locomotion Level;Transfers;Continence;Toileting    Examination-Participation Restrictions Community Activity;Shop;Laundry;Meal Prep    Stability/Clinical Decision Making Evolving/Moderate complexity    Rehab Potential Good    PT Treatment/Interventions Biofeedback;Cryotherapy;Electrical Stimulation;Moist Heat;Ultrasound;Therapeutic exercise;Therapeutic activities;Neuromuscular re-education;Patient/family education;Passive range of motion;Manual techniques;Spinal Manipulations;Joint Manipulations    PT Next Visit Plan discharge to HEP    PT Home Exercise Plan Access Code: [email or web address]    Consulted and Agree with Plan of Care Patient           Patient will benefit from skilled therapeutic intervention in order to improve the following deficits and impairments:  Decreased coordination, Decreased range of motion, Increased fascial restricitons, Decreased endurance, Increased muscle spasms, Decreased activity tolerance, Pain, Decreased scar mobility, Impaired  flexibility, Decreased mobility, Decreased strength  Visit Diagnosis: Muscle weakness (generalized)  Cramp and spasm  Chronic low back pain without sciatica, unspecified back pain laterality  Urinary incontinence, unspecified type     Problem List Patient Active Problem List   Diagnosis Date Noted  . Supervision of low-risk pregnancy 11/23/2018  . Chronic PID (chronic pelvic inflammatory disease) 12/13/2017  . Status post repeat low transverse cesarean section 03/30/2017  . Pelvic inflammatory disease (PID) 02/21/2013  . Migraines 07/24/2012  . Previous cesarean delivery, antepartum condition or complication 96/22/2979  . Obese 03/30/2012  . Seizure disorder ([place]) 02/03/2012  . Family history of breast cancer in mother 02/03/2012    Earlie Counts, PT 01/24/20 5:05 PM   [facility] [address] [place], [place], [address] Phone: [phone]   Fax:  [phone]  Name: Chloe Rodgers MRN: [id number] Date of Birth: [birth year]  PHYSICAL THERAPY DISCHARGE SUMMARY  Visits from Start of Care: 14  Current functional level related to goals / functional outcomes: See above.   Remaining deficits: See above.   Education / Equipment: HEP Plan: Patient agrees to discharge.  Patient  goals were met. Patient is being discharged due to not returning since the last visit. Thank you for the referral.Dinh Ayotte Pearline Cables, PT 01/24/20 5:07 PM   ???  ??

## 2020-01-24 NOTE — Patient Instructions (Signed)
Access Code: [email or web address] URL: [email or web address] Date: 01/24/2020 Prepared by: Eulis Foster  Exercises Supine Double Knee to Chest - 1 x daily - 7 x weekly - 2 reps - 1 sets - 15 sec hold Supine Diaphragmatic Breathing - 3 x daily - 7 x weekly - 10 reps - 1 sets Hooklying Single Knee to Chest - 1 x daily - 7 x weekly - 5 reps - 1 sets - 10 sec hold Supine Lower Trunk Rotation - 1 x daily - 7 x weekly - 10 reps - 3 sets Supine Hamstring Stretch with Strap - 2 x daily - 7 x weekly - 2 reps - 1 sets - 20 hold Seated Piriformis Stretch with Trunk Bend - 1 x daily - 7 x weekly - 2 reps - 1 sets - 30 seconds hold Supine Caregiver Abdominal Wall 'ILU' Massage - 1 x daily - 7 x weekly - 1 reps - 1 sets - 3 min hold Positioning on Toilet and Defecation Technique - 1 x daily - 7 x weekly - 1 reps - 1 sets Seated Hamstring Stretch - 1 x daily - 7 x weekly - 2 reps - 1 sets - 30 sec hold Seated Hip Adductor Stretch - 1 x daily - 7 x weekly - 2 reps - 1 sets - 30 sec hold Supine Butterfly Groin Stretch - 1 x daily - 7 x weekly - 2 reps - 1 sets - 30 sec hold Quadruped Cat Camel - 1 x daily - 7 x weekly - 10 reps - 1 sets Child's Pose Stretch - 1 x daily - 7 x weekly - 2 reps - 1 sets - 30 sec hold Childs Pose Side Bend - 1 x daily - 7 x weekly - 2 reps - 1 sets - 30 sec hold Hooklying Isometric Hip Flexion - 1 x daily - 7 x weekly - 10 reps - 1 sets - 5 sec hold Supine Bridge - 1 x daily - 7 x weekly - 10 reps - 1 sets Clamshell - 1 x daily - 7 x weekly - 10 reps - 1 sets Sitting to Supine Roll - 1 x daily - 7 x weekly - 10 reps - 3 sets Sit to Stand with Pelvic Floor Contraction - 1 x daily - 7 x weekly - 5 reps - 1 sets Supine Transversus Abdominis Bracing with Heel Slide - 1 x daily - 7 x weekly - 3 sets - 10 reps Supine Single Arm Shoulder Flexion with Resistance - 1 x daily - 7 x weekly - 3 sets - 10 reps Seated Shoulder Horizontal Abduction with Resistance - 1 x daily - 7 x weekly -  1 sets - 10 reps Prone Alternating Arm and Leg Lifts - 1 x daily - 7 x weekly - 10 reps - 1 sets Seated Shoulder Diagonal Pulls with Resistance - 1 x daily - 7 x weekly - 1 sets - 10 reps  Patient Education [employer] [facility] [employer] [facility] [address] [place], [place] [address] Phone # [phone] Fax [phone]

## 2020-02-13 ENCOUNTER — Other Ambulatory Visit: Payer: Self-pay | Admitting: Genetic Counselor

## 2020-02-13 ENCOUNTER — Inpatient Hospital Stay: Payer: Medicaid Other

## 2020-02-13 ENCOUNTER — Other Ambulatory Visit: Payer: Self-pay

## 2020-02-13 ENCOUNTER — Inpatient Hospital Stay: Payer: Medicaid Other | Attending: Genetic Counselor | Admitting: Genetic Counselor

## 2020-02-13 ENCOUNTER — Encounter: Payer: Self-pay | Admitting: Genetic Counselor

## 2020-02-13 DIAGNOSIS — Z803 Family history of malignant neoplasm of breast: Secondary | ICD-10-CM

## 2020-02-13 DIAGNOSIS — Z800 Family history of malignant neoplasm of digestive organs: Secondary | ICD-10-CM | POA: Diagnosis not present

## 2020-02-13 LAB — GENETIC SCREENING ORDER

## 2020-02-13 NOTE — Progress Notes (Signed)
REFERRING PROVIDER: Emelia Loron, NP [place],  [place] [address]  PRIMARY PROVIDER:  [facility]  PRIMARY REASON FOR VISIT:  1. Family history of breast cancer   2. Family history of colon cancer      HISTORY OF PRESENT ILLNESS:   Ms. Osterman, a 29 y.o. female, was seen for a [facility] cancer genetics consultation at the request of Dr. Coralyn Mark due to a family history of breast and colon cancer.  Ms. Ferrando presents to clinic today to discuss the possibility of a hereditary predisposition to cancer, genetic testing, and to further clarify her future cancer risks, as well as potential cancer risks for family members.   Ms. Lear is a 29 y.o. female with no personal history of cancer.    CANCER HISTORY:  Oncology History   No history exists.     RISK FACTORS:  Menarche was at age 39.  First live birth at age 69.  OCP use for approximately 0 years.  Ovaries intact: yes.  Hysterectomy: no.  Menopausal status: premenopausal.  HRT use: 0 years. Colonoscopy: no; not examined. Mammogram within the last year: no. Number of breast biopsies: 0. Up to date with pelvic exams: yes. Any excessive radiation exposure in the past: no  Past Medical History:  Diagnosis Date  . Anemia   . Anxiety   . Chlamydia   . Complication of anesthesia    ??, seizure with wisdom teeth  . Depression    not on meds, sees a therapist  . Family history of breast cancer   . Family history of colon cancer   . Infection    UTI  . Kidney stone    kidney stones  . Migraine   . PID (acute pelvic inflammatory disease) 2014  . PONV (postoperative nausea and vomiting)   . Psoriasis   . Seizures [facility])    July 2013 - [place]    Past Surgical History:  Procedure Laterality Date  . CESAREAN SECTION    . CESAREAN SECTION  06/06/2012   Procedure: CESAREAN SECTION;  Surgeon: Woodroe Mode, MD;  Location: [facility];  Service: Obstetrics;  Laterality: N/A;  .  CESAREAN SECTION N/A 03/30/2017   Procedure: REPEAT CESAREAN SECTION;  Surgeon: Florian Buff, MD;  Location: [facility];  Service: Obstetrics;  Laterality: N/A;  . SKIN GRAFT     off abd, onto arm  . WISDOM TOOTH EXTRACTION      Social History   Socioeconomic History  . Marital status: Single    Spouse name: Not on file  . Number of children: Not on file  . Years of education: Not on file  . Highest education level: Not on file  Occupational History  . Not on file  Tobacco Use  . Smoking status: Never Smoker  . Smokeless tobacco: Never Used  Vaping Use  . Vaping Use: Never used  Substance and Sexual Activity  . Alcohol use: Yes    Comment: occ  . Drug use: No  . Sexual activity: Yes    Birth control/protection: Injection, None    Comment: last march 2019  Other Topics Concern  . Not on file  Social History Narrative  . Not on file   Social Determinants of Health   Financial Resource Strain:   . Difficulty of Paying Living Expenses:   Food Insecurity:   . Worried About Charity fundraiser in the Last Year:   . [employer]  in the Last Year:   Transportation Needs:   . Film/video editor (Medical):   Marland Kitchen Lack of Transportation (Non-Medical):   Physical Activity:   . Days of Exercise per Week:   . Minutes of Exercise per Session:   Stress:   . Feeling of Stress :   Social Connections:   . Frequency of Communication with Friends and Family:   . Frequency of Social Gatherings with Friends and Family:   . Attends Religious Services:   . Active Member of Clubs or Organizations:   . Attends Archivist Meetings:   Marland Kitchen Marital Status:      FAMILY HISTORY:  We obtained a detailed, 4-generation family history.  Significant diagnoses are listed below: Family History  Problem Relation Age of Onset  . Hypertension Mother   . Diabetes Mother   . Stroke Mother   . Seizures Mother   . Breast cancer Mother        dx in her early 50s  . Asthma  Daughter   . Hypertension Maternal Grandmother   . Diabetes Maternal Grandmother   . Cancer Maternal Grandmother        BONE CANCER  . Breast cancer Maternal Grandmother        dx in her early 75s  . Colon cancer Maternal Grandmother        dx in her 68s  . Dementia Paternal Grandmother   . Other Neg Hx     The patient has two daughters and one son who are cancer free.  She has two maternal half brothers who are cancer free and a paternal half brother who is cancer free.  Both parents are living.  The patient's mother was diagnosed with breast cancer in her early 65's.  She is an only child.  Her mother was diagnosed with breast cancer in her 86's and colon cancer in her 10's.  The grandmother had five sisters, some had breast cancer and some had colon cancer.  The patient's father is living and reportedly cancer free.  He had four siblings who are not known to have cancer.  The patient states she is not familiar with this side of the family.  Ms. Lebon is unaware of previous family history of genetic testing for hereditary cancer risks. Patient's maternal ancestors are of [place]ican/Black descent, and paternal ancestors are of [place] descent. There is no reported Ashkenazi Jewish ancestry. There is no known consanguinity.  GENETIC COUNSELING ASSESSMENT: Ms. Sorto is a 29 y.o. female with a family history of breast and colon cancer which is somewhat suggestive of a hereditary cancer syndrome and predisposition to cancer given the multiple generations of young onset breast cancer. We, therefore, discussed and recommended the following at today's visit.   DISCUSSION: We discussed that 5 - 10% of breast cancer is hereditary, with most cases associated with BRCA mutations.  There are other genes that can be associated with hereditary breast cancer syndromes.  These include ATM, PALB2 and CHEK2.  We discussed that testing is beneficial for several reasons including knowing how to follow  individuals and understand if other family members could be at risk for cancer and allow them to undergo genetic testing. While the patient meets criteria for genetic testing, she is not the best person to test.  Her mother, who had breast cancer in her 27's would be the most informative person in the family. Ms. Burklow indicated that she will call her mother and discuss this.  We reviewed  the characteristics, features and inheritance patterns of hereditary cancer syndromes. We also discussed genetic testing, including the appropriate family members to test, the process of testing, insurance coverage and turn-around-time for results. We discussed the implications of a negative, positive, carrier and/or variant of uncertain significant result. We recommended Ms. Iott pursue genetic testing for the common hereditary gene panel. The Common Hereditary Gene Panel offered by [facility] includes sequencing and/or deletion duplication testing of the following 48 genes: APC, ATM, AXIN2, BARD1, BMPR1A, BRCA1, BRCA2, BRIP1, CDH1, CDK4, CDKN2A (p14ARF), CDKN2A (p16INK4a), CHEK2, CTNNA1, DICER1, EPCAM (Deletion/duplication testing only), GREM1 (promoter region deletion/duplication testing only), KIT, MEN1, MLH1, MSH2, MSH3, MSH6, MUTYH, NBN, NF1, NHTL1, PALB2, PDGFRA, PMS2, POLD1, POLE, PTEN, RAD50, RAD51C, RAD51D, RNF43, SDHB, SDHC, SDHD, SMAD4, SMARCA4. STK11, TP53, TSC1, TSC2, and VHL.  The following genes were evaluated for sequence changes only: SDHA and HOXB13 c.251G>A variant only.   Based on Ms. Dudash's family history of cancer, she meets medical criteria for genetic testing. Despite that she meets criteria, she may still have an out of pocket cost. We discussed that if her out of pocket cost for testing is over $100, the laboratory will call and confirm whether she wants to proceed with testing.  If the out of pocket cost of testing is less than $100 she will be billed by the genetic testing laboratory.   Based on  the patient's family history, a statistical model (Tyrer Cusik) was used to estimate her risk of developing breast cancer. This estimates her lifetime risk of developing breast cancer to be approximately 41.2%. This estimation does not consider any genetic testing results.  The patient's lifetime breast cancer risk is a preliminary estimate based on available information using one of several models endorsed by the [place] (ACS). The ACS recommends consideration of breast MRI screening as an adjunct to mammography for patients at high risk (defined as 20% or greater lifetime risk).   Ms. Desrocher has been determined to be at high risk for breast cancer.  Therefore, we recommend that annual screening with mammography and breast MRI be performed beginning at age 50, or 10 years prior to the age of breast cancer diagnosis in a relative (whichever is earlier).  We discussed that Ms. Sano should discuss her individual situation with her referring physician and determine a breast cancer screening plan with which they are both comfortable.      PLAN: After considering the risks, benefits, and limitations, Ms. Giorgio provided informed consent to pursue genetic testing and the blood sample was sent to [facility] for analysis of the common hereditary cancer panel. Results should be available within approximately 2-3 weeks' time, at which point they will be disclosed by telephone to Ms. Styles, as will any additional recommendations warranted by these results. Ms. Transue will receive a summary of her genetic counseling visit and a copy of her results once available. This information will also be available in Epic.   Based on Ms. Shepherd's family history, we recommended her mother, who was diagnosed with breast cancer in her early 56's, have genetic counseling and testing. Ms. Marrone will let us know if we can be of any assistance in coordinating genetic counseling and/or testing for this family member.    Lastly, we encouraged Ms. Vanegas to remain in contact with cancer genetics annually so that we can continuously update the family history and inform her of any changes in cancer genetics and testing that may be of benefit for this family.  Ms. Ruffini questions were answered to her satisfaction today. Our contact information was provided should additional questions or concerns arise. Thank you for the referral and allowing [place] to share in the care of your patient.   Nyrah Demos P. Florene Glen, [place], [facility] Licensed, Insurance risk surveyor [email or web address] phone: [phone]  The patient was seen for a total of 32 minutes in face-to-face genetic counseling.  This patient was discussed with Drs. Magrinat, Lindi Adie and/or Burr Medico who agrees with the above.    _______________________________________________________________________ For Office Staff:  Number of people involved in session: 1 Was an Intern/ student involved with case: no

## 2020-03-07 ENCOUNTER — Telehealth: Payer: Self-pay | Admitting: Genetic Counselor

## 2020-03-07 ENCOUNTER — Ambulatory Visit: Payer: Self-pay | Admitting: Genetic Counselor

## 2020-03-07 ENCOUNTER — Encounter: Payer: Self-pay | Admitting: Genetic Counselor

## 2020-03-07 DIAGNOSIS — Z1379 Encounter for other screening for genetic and chromosomal anomalies: Secondary | ICD-10-CM

## 2020-03-07 NOTE — Telephone Encounter (Signed)
Revealed negative genetic testing.  Discussed that we do not know why there is cancer in the family. It could be due to a different gene that we are not testing, or maybe our current technology may not be able to pick something up.  It will be important for her to keep in contact with genetics to keep up with whether additional testing may be needed. Two VUS identified but these will not change medical management.

## 2020-03-07 NOTE — Progress Notes (Signed)
HPI:  Ms. Propst was previously seen in the [place] clinic due to a family history of breast and colon and concerns regarding a hereditary predisposition to cancer. Please refer to our prior cancer genetics clinic note for more information regarding our discussion, assessment and recommendations, at the time. Ms. Addis recent genetic test results were disclosed to her, as were recommendations warranted by these results. These results and recommendations are discussed in more detail below.  CANCER HISTORY:  Oncology History   No history exists.    FAMILY HISTORY:  We obtained a detailed, 4-generation family history.  Significant diagnoses are listed below: Family History  Problem Relation Age of Onset   Hypertension Mother    Diabetes Mother    Stroke Mother    Seizures Mother    Breast cancer Mother        dx in her early 61s   Asthma Daughter    Hypertension Maternal Grandmother    Diabetes Maternal Grandmother    Cancer Maternal Grandmother        BONE CANCER   Breast cancer Maternal Grandmother        dx in her early 24s   Colon cancer Maternal Grandmother        dx in her 46s   Dementia Paternal Grandmother    Other Neg Hx     The patient has two daughters and one son who are cancer free.  She has two maternal half brothers who are cancer free and a paternal half brother who is cancer free.  Both parents are living.  The patient's mother was diagnosed with breast cancer in her early 25's.  She is an only child.  Her mother was diagnosed with breast cancer in her 20's and colon cancer in her 62's.  The grandmother had five sisters, some had breast cancer and some had colon cancer.  The patient's father is living and reportedly cancer free.  He had four siblings who are not known to have cancer.  The patient states she is not familiar with this side of the family.  Ms. Fern is unaware of previous family history of genetic testing for  hereditary cancer risks. Patient's maternal ancestors are of Jamaican/Black descent, and paternal ancestors are of [place] descent. There is no reported Ashkenazi Jewish ancestry. There is no known consanguinity.  GENETIC TEST RESULTS: Genetic testing reported out on March 07, 2020 through the common hereditary cancer panel found no pathogenic mutations. The Common Hereditary Gene Panel offered by [facility] includes sequencing and/or deletion duplication testing of the following 48 genes: APC, ATM, AXIN2, BARD1, BMPR1A, BRCA1, BRCA2, BRIP1, CDH1, CDK4, CDKN2A (p14ARF), CDKN2A (p16INK4a), CHEK2, CTNNA1, DICER1, EPCAM (Deletion/duplication testing only), GREM1 (promoter region deletion/duplication testing only), KIT, MEN1, MLH1, MSH2, MSH3, MSH6, MUTYH, NBN, NF1, NHTL1, PALB2, PDGFRA, PMS2, POLD1, POLE, PTEN, RAD50, RAD51C, RAD51D, RNF43, SDHB, SDHC, SDHD, SMAD4, SMARCA4. STK11, TP53, TSC1, TSC2, and VHL.  The following genes were evaluated for sequence changes only: SDHA and HOXB13 c.251G>A variant only. The test report has been scanned into EPIC and is located under the Molecular Pathology section of the Results Review tab.  A portion of the result report is included below for reference.     We discussed with Ms. Greenspan that because current genetic testing is not perfect, it is possible there may be a gene mutation in one of these genes that current testing cannot detect, but that chance is small.  We also discussed, that there could be  another gene that has not yet been discovered, or that we have not yet tested, that is responsible for the cancer diagnoses in the family. It is also possible there is a hereditary cause for the cancer in the family that Ms. Fomby did not inherit and therefore was not identified in her testing.  Therefore, it is important to remain in touch with cancer genetics in the future so that we can continue to offer Ms. Siebers the most up to date genetic testing.   Genetic testing  did identify two Variants of uncertain significance (VUS) - one in the MSH6 gene called c.643G>A (p.Val215Ile) and a second in the PALB2 gene called c.37_39del (p.Glu13del).  At this time, it is unknown if these variants are associated with increased cancer risk or if they are normal findings, but most variants such as these get reclassified to being inconsequential. They should not be used to make medical management decisions. With time, we suspect the lab will determine the significance of these variants, if any. If we do learn more about them, we will try to contact Ms. Glasser to discuss it further. However, it is important to stay in touch with [place] periodically and keep the address and phone number up to date.  ADDITIONAL GENETIC TESTING: We discussed with Ms. Oliveto that there are other genes that are associated with increased cancer risk that can be analyzed. Should Ms. Hadaway wish to pursue additional genetic testing, we are happy to discuss and coordinate this testing, at any time.    CANCER SCREENING RECOMMENDATIONS: Ms. Mikrut test result is considered negative (normal).  This means that we have not identified a hereditary cause for her family history of breast and colon at this time. Most cancers happen by chance and this negative test suggests that her cancer may fall into this category.    While reassuring, this does not definitively rule out a hereditary predisposition to cancer. It is still possible that there could be genetic mutations that are undetectable by current technology. There could be genetic mutations in genes that have not been tested or identified to increase cancer risk.  Therefore, it is recommended she continue to follow the cancer management and screening guidelines provided by her primary healthcare provider.   An individual's cancer risk and medical management are not determined by genetic test results alone. Overall cancer risk assessment incorporates additional factors,  including personal medical history, family history, and any available genetic information that may result in a personalized plan for cancer prevention and surveillance  RECOMMENDATIONS FOR FAMILY MEMBERS:  Individuals in this family might be at some increased risk of developing cancer, over the general population risk, simply due to the family history of cancer.  We recommended women in this family have a yearly mammogram beginning at age 34, or 29 years younger than the earliest onset of cancer, an annual clinical breast exam, and perform monthly breast self-exams. Women in this family should also have a gynecological exam as recommended by their primary provider. All family members should be referred for colonoscopy starting at age 12.  It is also possible there is a hereditary cause for the cancer in Ms. Roat's family that she did not inherit and therefore was not identified in her.  Based on Ms. Stauber's family history, we recommended her mother, who was diagnosed with breast cancer in her early 60's, have genetic counseling and testing. Ms. Calandra will let us know if we can be of any assistance in coordinating genetic counseling  and/or testing for this family member.   FOLLOW-UP: Lastly, we discussed with Ms. Kelm that cancer genetics is a rapidly advancing field and it is possible that new genetic tests will be appropriate for her and/or her family members in the future. We encouraged her to remain in contact with cancer genetics on an annual basis so we can update her personal and family histories and let her know of advances in cancer genetics that may benefit this family.   Our contact number was provided. Ms. Blaker questions were answered to her satisfaction, and she knows she is welcome to call us at anytime with additional questions or concerns.   Roma Kayser, [place], [facility] Licensed, Certified Genetic Counselor [email or web address]

## 2020-05-13 ENCOUNTER — Encounter (HOSPITAL_COMMUNITY): Payer: Self-pay | Admitting: Emergency Medicine

## 2020-05-13 ENCOUNTER — Other Ambulatory Visit: Payer: Self-pay

## 2020-05-13 ENCOUNTER — Ambulatory Visit (HOSPITAL_COMMUNITY)
Admission: EM | Admit: 2020-05-13 | Discharge: 2020-05-13 | Disposition: A | Discharge status: Home / Self Care | Payer: Medicaid Other | Attending: Family Medicine | Admitting: Family Medicine

## 2020-05-13 DIAGNOSIS — R102 Pelvic and perineal pain: Secondary | ICD-10-CM | POA: Insufficient documentation

## 2020-05-13 DIAGNOSIS — Z20822 Contact with and (suspected) exposure to covid-19: Secondary | ICD-10-CM | POA: Insufficient documentation

## 2020-05-13 DIAGNOSIS — Z886 Allergy status to analgesic agent status: Secondary | ICD-10-CM | POA: Diagnosis not present

## 2020-05-13 DIAGNOSIS — R112 Nausea with vomiting, unspecified: Secondary | ICD-10-CM | POA: Diagnosis not present

## 2020-05-13 DIAGNOSIS — Z3202 Encounter for pregnancy test, result negative: Secondary | ICD-10-CM

## 2020-05-13 DIAGNOSIS — R0602 Shortness of breath: Secondary | ICD-10-CM | POA: Diagnosis not present

## 2020-05-13 DIAGNOSIS — N898 Other specified noninflammatory disorders of vagina: Secondary | ICD-10-CM | POA: Diagnosis not present

## 2020-05-13 DIAGNOSIS — R059 Cough, unspecified: Secondary | ICD-10-CM | POA: Insufficient documentation

## 2020-05-13 DIAGNOSIS — R509 Fever, unspecified: Secondary | ICD-10-CM | POA: Diagnosis not present

## 2020-05-13 LAB — POCT URINALYSIS DIPSTICK, ED / UC
Bilirubin Urine: NEGATIVE
Glucose, UA: NEGATIVE mg/dL
Ketones, ur: NEGATIVE mg/dL
Nitrite: NEGATIVE
Protein, ur: NEGATIVE mg/dL
Specific Gravity, Urine: 1.025 (ref 1.005–1.030)
Urobilinogen, UA: 1 mg/dL (ref 0.0–1.0)
pH: 6.5 (ref 5.0–8.0)

## 2020-05-13 LAB — POC URINE PREG, ED: Preg Test, Ur: NEGATIVE

## 2020-05-13 MED ORDER — CEFTRIAXONE SODIUM 500 MG IJ SOLR
500.0000 mg | Freq: Once | INTRAMUSCULAR | Status: AC
  Administered 2020-05-13: 500 mg via INTRAMUSCULAR

## 2020-05-13 MED ORDER — METRONIDAZOLE 500 MG PO TABS: 500.0000 mg | ORAL_TABLET | Freq: Two times a day (BID) | ORAL | 0 refills | Status: DC

## 2020-05-13 MED ORDER — CEFTRIAXONE SODIUM 500 MG IJ SOLR
INTRAMUSCULAR | Status: AC
  Filled 2020-05-13: qty 500

## 2020-05-13 MED ORDER — LIDOCAINE HCL (PF) 1 % IJ SOLN
INTRAMUSCULAR | Status: AC
  Filled 2020-05-13: qty 2

## 2020-05-13 MED ORDER — ONDANSETRON 4 MG PO TBDP: 4.0000 mg | ORAL_TABLET | Freq: Three times a day (TID) | ORAL | 0 refills | Status: DC | PRN

## 2020-05-13 MED ORDER — DOXYCYCLINE HYCLATE 100 MG PO TABS: 100.0000 mg | ORAL_TABLET | Freq: Two times a day (BID) | ORAL | 0 refills | Status: DC

## 2020-05-13 MED ORDER — ONDANSETRON HCL 4 MG/2ML IJ SOLN
INTRAMUSCULAR | Status: AC
  Filled 2020-05-13: qty 2

## 2020-05-13 MED ORDER — ONDANSETRON HCL 4 MG/2ML IJ SOLN
4.0000 mg | Freq: Once | INTRAMUSCULAR | Status: AC
  Administered 2020-05-13: 4 mg via INTRAMUSCULAR

## 2020-05-13 NOTE — ED Triage Notes (Addendum)
Symptoms started 2 days ago. Patient has had chills, sob, headache, and chest pain.  Pain in lower rib cage, bilaterally  Patient has vomiting and diarrhea.  Patient has vomited x 4 today, diarrhea x 2 today Patient also complains of vaginal pain.  Reports vaginal discharge, burning and itching if vagina.  No pain with urination.

## 2020-05-13 NOTE — Discharge Instructions (Signed)
[facility] at [facility] [address] [place], [place] [address] [phone]

## 2020-05-14 LAB — RESP PANEL BY RT PCR (RSV, FLU A&B, COVID)
Influenza A by PCR: NEGATIVE
Influenza B by PCR: NEGATIVE
Respiratory Syncytial Virus by PCR: NEGATIVE
SARS Coronavirus 2 by RT PCR: NEGATIVE

## 2020-05-14 LAB — CERVICOVAGINAL ANCILLARY ONLY
Bacterial Vaginitis (gardnerella): POSITIVE — AB
Candida Glabrata: NEGATIVE
Candida Vaginitis: NEGATIVE
Chlamydia: NEGATIVE
Comment: NEGATIVE
Comment: NEGATIVE
Comment: NEGATIVE
Comment: NEGATIVE
Comment: NEGATIVE
Comment: NORMAL
Neisseria Gonorrhea: NEGATIVE
Trichomonas: NEGATIVE

## 2020-05-16 NOTE — ED Provider Notes (Signed)
[facility]    CSN: [id number] Arrival date & time: 05/13/20  1915      History   Chief Complaint Chief Complaint  Patient presents with  . Shortness of Breath    HPI Chloe Rodgers is a 29 y.o. female.   Patient here today with several complaints - progressive vaginal and pelvic pain, discharge, burning and itching and 2 day hx of chills, headache, SOB, CP, N/V/D. Reports fever of 103 day before yesterday but none since. States she has not kept anything down in 2 days and feels weak. Has not been able to take anything for sxs due to the N/V. Hx of chronic PID with chronic pelvic pain and urinary incontinence issues. No new STI exposures known, hematuria, bleeding, and no sick contacts, hx of pulmonary dz known, wheezing, sore throat, congestion.       Past Medical History:  Diagnosis Date  . Anemia   . Anxiety   . Chlamydia   . Complication of anesthesia    ??, seizure with wisdom teeth  . Depression    not on meds, sees a therapist  . Family history of breast cancer   . Family history of colon cancer   . Infection    UTI  . Kidney stone    kidney stones  . Migraine   . PID (acute pelvic inflammatory disease) 2014  . PONV (postoperative nausea and vomiting)   . Psoriasis   . Seizures [facility])    July 2013 - [place]    Patient Active Problem List   Diagnosis Date Noted  . Genetic testing 03/07/2020  . Family history of breast cancer   . Family history of colon cancer   . Supervision of low-risk pregnancy 11/23/2018  . Chronic PID (chronic pelvic inflammatory disease) 12/13/2017  . Status post repeat low transverse cesarean section 03/30/2017  . Pelvic inflammatory disease (PID) 02/21/2013  . Migraines 07/24/2012  . Previous cesarean delivery, antepartum condition or complication 03/30/2012  . Obese 03/30/2012  . Seizure disorder (HCC) 02/03/2012  . Family history of breast cancer in mother 02/03/2012    Past Surgical History:  Procedure  Laterality Date  . CESAREAN SECTION    . CESAREAN SECTION  06/06/2012   Procedure: CESAREAN SECTION;  Surgeon: Adam Phenix, MD;  Location: [facility];  Service: Obstetrics;  Laterality: N/A;  . CESAREAN SECTION N/A 03/30/2017   Procedure: REPEAT CESAREAN SECTION;  Surgeon: Lazaro Arms, MD;  Location: [facility] BIRTHING SUITES;  Service: Obstetrics;  Laterality: N/A;  . SKIN GRAFT     off abd, onto arm  . WISDOM TOOTH EXTRACTION      OB History    Gravida  6   Para  3   Term  3   Preterm  0   AB  2   Living  3     SAB  0   TAB  1   Ectopic  0   Multiple  0   Live Births  3            Home Medications    Prior to Admission medications   Medication Sig Start Date End Date Taking? Authorizing Provider  Oxycodone HCl 10 MG TABS Take 10 mg by mouth 3 (three) times daily. 04/21/20  Yes [provider]  acetaminophen (TYLENOL) 325 MG tablet Take 2 tablets (650 mg total) by mouth every 6 (six) hours as needed. 12/27/19   Lamptey, Britta Mccreedy, MD  bacitracin ointment Apply 1 application  topically 2 (two) times daily. 12/27/19   Lamptey, Britta Mccreedy, MD  doxycycline (VIBRA-TABS) 100 MG tablet Take 1 tablet (100 mg total) by mouth 2 (two) times daily. 05/13/20   Particia Nearing, PA-C  metroNIDAZOLE (FLAGYL) 500 MG tablet Take 1 tablet (500 mg total) by mouth 2 (two) times daily. 05/13/20   Particia Nearing, PA-C  ondansetron (ZOFRAN ODT) 4 MG disintegrating tablet Take 1 tablet (4 mg total) by mouth every 8 (eight) hours as needed for nausea or vomiting. 05/13/20   Particia Nearing, PA-C  promethazine (PHENERGAN) 25 MG tablet Take 1 tablet (25 mg total) by mouth every 6 (six) hours as needed for nausea or vomiting. 11/27/18 12/27/19  Hermina Staggers, MD    Family History Family History  Problem Relation Age of Onset  . Hypertension Mother   . Diabetes Mother   . Stroke Mother   . Seizures Mother   . Breast cancer Mother        dx in her early 19s  . Asthma  Daughter   . Hypertension Maternal Grandmother   . Diabetes Maternal Grandmother   . Cancer Maternal Grandmother        BONE CANCER  . Breast cancer Maternal Grandmother        dx in her early 52s  . Colon cancer Maternal Grandmother        dx in her 18s  . Dementia Paternal Grandmother   . Other Neg Hx     Social History Social History   Tobacco Use  . Smoking status: Never Smoker  . Smokeless tobacco: Never Used  Vaping Use  . Vaping Use: Never used  Substance Use Topics  . Alcohol use: Yes    Comment: occ  . Drug use: No     Allergies   Naproxen and Toradol [ketorolac tromethamine]   Review of Systems Review of Systems PER HPI    Physical Exam Triage Vital Signs ED Triage Vitals  Enc Vitals Group     BP 05/13/20 1942 131/83     Pulse Rate 05/13/20 1942 92     Resp 05/13/20 1942 (!) 22     Temp 05/13/20 1942 98 F (36.7 C)     Temp Source 05/13/20 1942 Oral     SpO2 05/13/20 1942 98 %     Weight --      Height --      Head Circumference --      Peak Flow --      Pain Score 05/13/20 1938 7     Pain Loc --      Pain Edu? --      Excl. in GC? --    No data found.  Updated Vital Signs BP 131/83 (BP Location: Right Arm) Comment (BP Location): large cuff  Pulse 92   Temp 98 F (36.7 C) (Oral)   Resp (!) 22   SpO2 98%   Visual Acuity Right Eye Distance:   Left Eye Distance:   Bilateral Distance:    Right Eye Near:   Left Eye Near:    Bilateral Near:     Physical Exam Vitals and nursing note reviewed.  Constitutional:      Appearance: Normal appearance.     Comments: Appears very uncomfortable, tearful  HENT:     Head: Atraumatic.     Right Ear: Tympanic membrane normal.     Left Ear: Tympanic membrane normal.  Eyes:     Extraocular Movements: Extraocular movements  intact.     Conjunctiva/sclera: Conjunctivae normal.  Cardiovascular:     Rate and Rhythm: Normal rate and regular rhythm.     Heart sounds: Normal heart sounds.   Pulmonary:     Effort: Pulmonary effort is normal.     Breath sounds: Normal breath sounds. No wheezing or rales.  Abdominal:     General: Bowel sounds are normal. There is no distension.     Palpations: Abdomen is soft.     Tenderness: There is no abdominal tenderness.  Genitourinary:    Vagina: Vaginal discharge (copius yellow/white discharge) present.     Comments: Vulvar erythema, edema and unable to fully pass speculum beyond initial portion of vaginal canal due to patient discomfort and request to end exam Musculoskeletal:        General: Normal range of motion.     Cervical back: Normal range of motion and neck supple.  Skin:    General: Skin is warm and dry.     Coloration: Skin is not pale.  Neurological:     General: No focal deficit present.     Mental Status: She is alert and oriented to person, place, and time.  Psychiatric:        Mood and Affect: Mood normal.        Thought Content: Thought content normal.        Judgment: Judgment normal.    UC Treatments / Results  Labs (all labs ordered are listed, but only abnormal results are displayed) Labs Reviewed  POCT URINALYSIS DIPSTICK, ED / UC - Abnormal; Notable for the following components:      Result Value   Hgb urine dipstick TRACE (*)    Leukocytes,Ua SMALL (*)    All other components within normal limits  CERVICOVAGINAL ANCILLARY ONLY - Abnormal; Notable for the following components:   Bacterial Vaginitis (gardnerella) Positive (*)    All other components within normal limits  RESP PANEL BY RT PCR (RSV, FLU A&B, COVID)  POC URINE PREG, ED    EKG   Radiology No results found.  Procedures Procedures (including critical care time)  Medications Ordered in UC Medications  cefTRIAXone (ROCEPHIN) injection 500 mg (500 mg Intramuscular Given 05/13/20 2021)  ondansetron (ZOFRAN) injection 4 mg (4 mg Intramuscular Given 05/13/20 2021)    Initial Impression / Assessment and Plan / UC Course  I have  reviewed the triage vital signs and the nursing notes.  Pertinent labs & imaging results that were available during my care of the patient were reviewed by me and considered in my medical decision making (see chart for details).     Resp panel pending for further evaluation of her viral appearing sxs, and aptima swab pending. U/A and urine preg negative. IM zofran and rocephin given in clinic given her severe N/V and concerning sxs for acute on chronic PID. D/c home on flagyl and doxy for broad coverage while awaiting results. Womens ED info given in case sxs worsening in meantime. F/u in next few days with GYN for recheck.   Final Clinical Impressions(s) / UC Diagnoses   Final diagnoses:  Pelvic pain  Fever, unspecified  Cough  SOB (shortness of breath)     Discharge Instructions     [facility] [address], [place], [place] [address] [phone]    ED Prescriptions    Medication Sig Dispense Auth. Provider   metroNIDAZOLE (FLAGYL) 500 MG tablet Take 1  tablet (500 mg total) by mouth 2 (two) times daily. 14 tablet Particia Nearing, [place]   doxycycline (VIBRA-TABS) 100 MG tablet Take 1 tablet (100 mg total) by mouth 2 (two) times daily. 14 tablet Particia Nearing, PA-C   ondansetron (ZOFRAN ODT) 4 MG disintegrating tablet Take 1 tablet (4 mg total) by mouth every 8 (eight) hours as needed for nausea or vomiting. 21 tablet Particia Nearing, [place]     PDMP not reviewed this encounter.   Roosvelt Maser [place], [place] 05/16/20 [phone]

## 2020-06-06 IMAGING — US US ART/VEN ABD/PELV/SCROTUM DOPPLER LTD
1 series · 13 of 25 positions shown · non-contrast
Comparison: 12/26/2017

CLINICAL DATA: 27-year-old with recurrent pelvic inflammatory
disease. Pelvic pain for 3 months.

EXAM:
TRANSABDOMINAL AND TRANSVAGINAL ULTRASOUND OF PELVIS
DOPPLER ULTRASOUND OF OVARIES
TECHNIQUE: Both transabdominal and transvaginal ultrasound examinations of the
pelvis were performed. Transabdominal technique was performed for
global imaging of the pelvis including uterus, ovaries, adnexal
regions, and pelvic cul-de-sac.
It was necessary to proceed with endovaginal exam following the
transabdominal exam to visualize the ovaries. Color and duplex
Doppler ultrasound was utilized to evaluate blood flow to the
ovaries.

[Series 1: us art/ven abd/pelv/scrotum doppler ltd · 0.21mm/px · 13 of 86 slices shown]
[im 1/86]
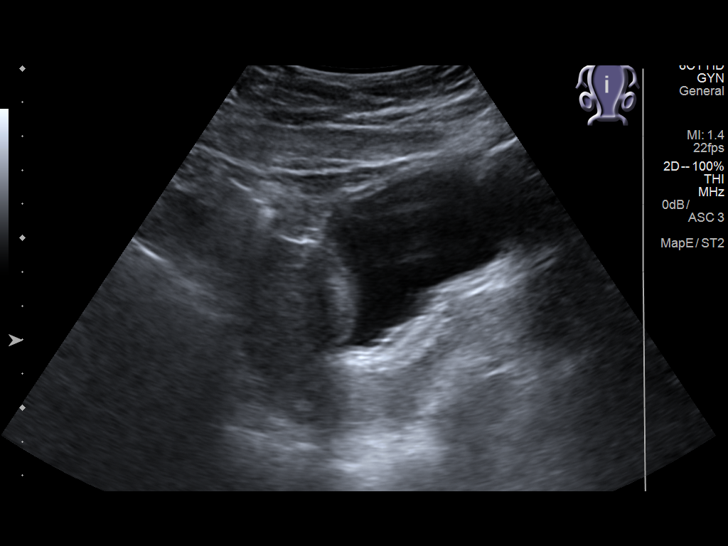
[im 8/86]
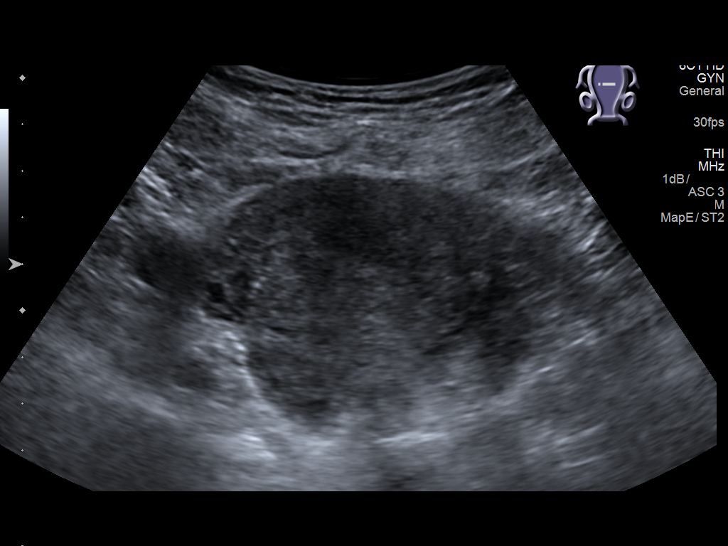
[im 15/86]
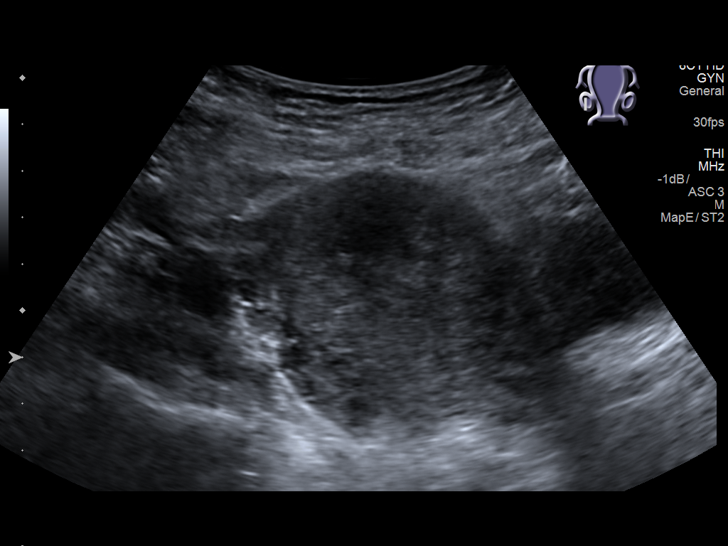
[im 22/86]
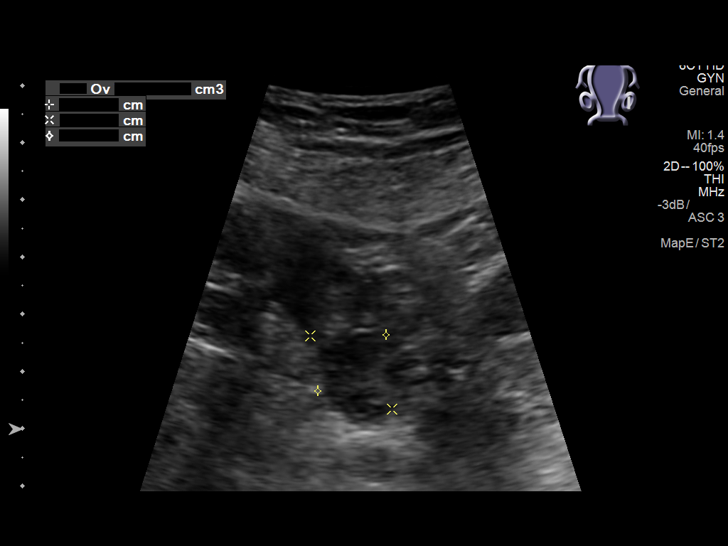
[im 29/86]
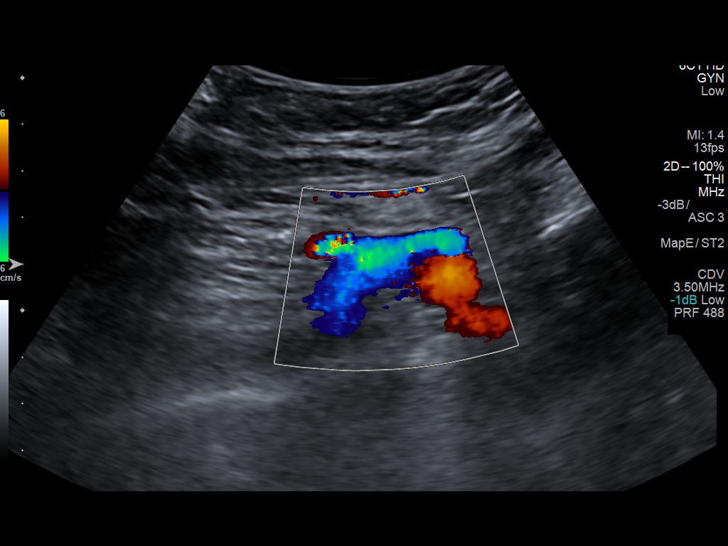
[im 36/86]
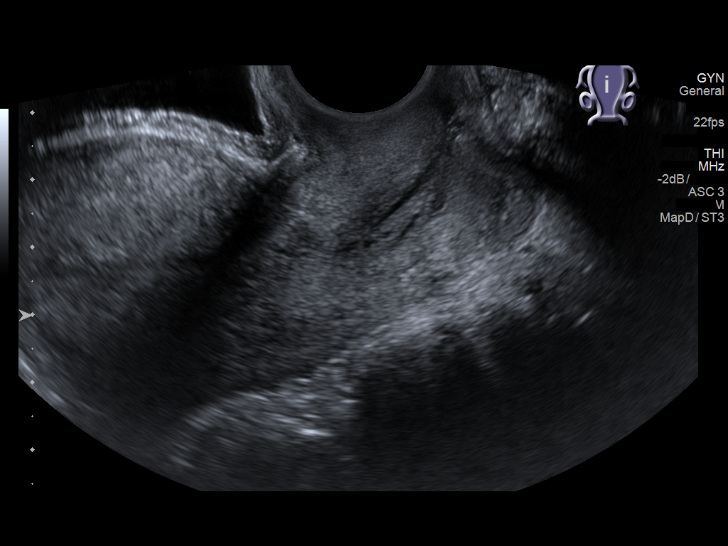
[im 43/86]
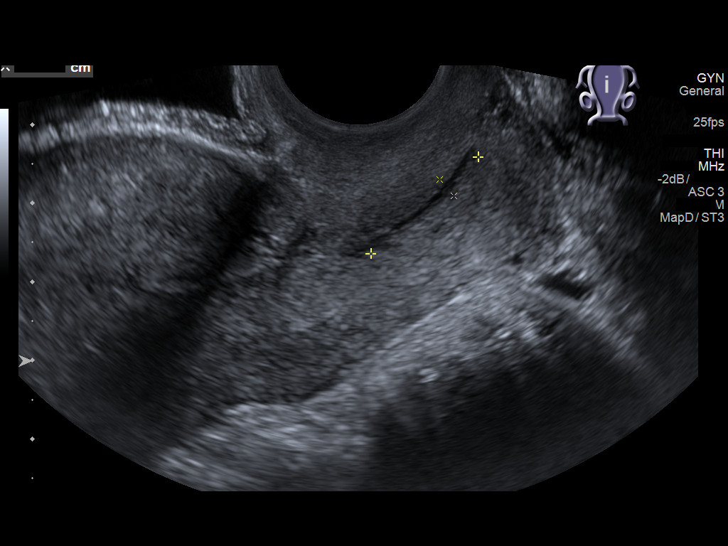
[im 50/86]
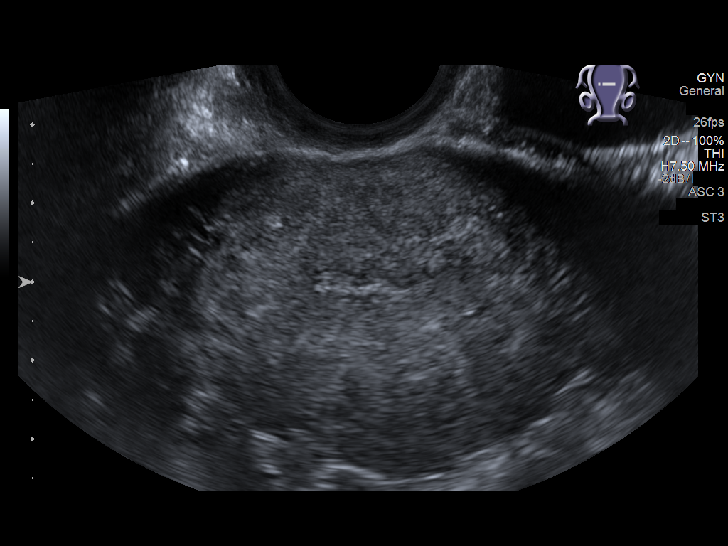
[im 57/86]
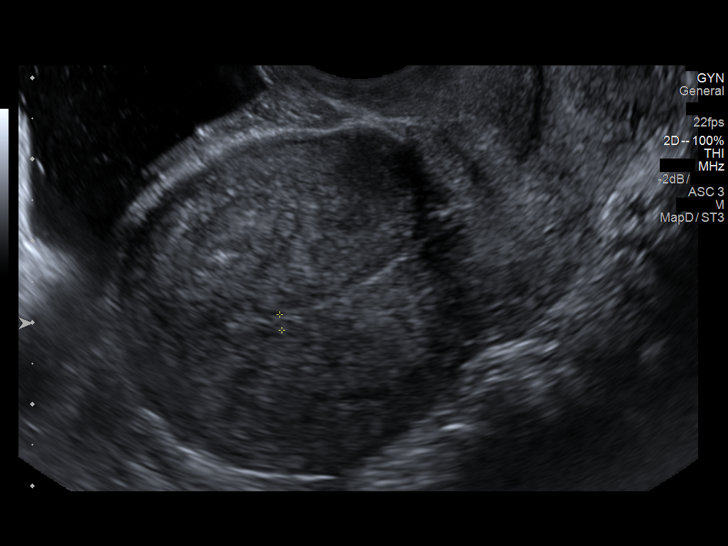
[im 64/86]
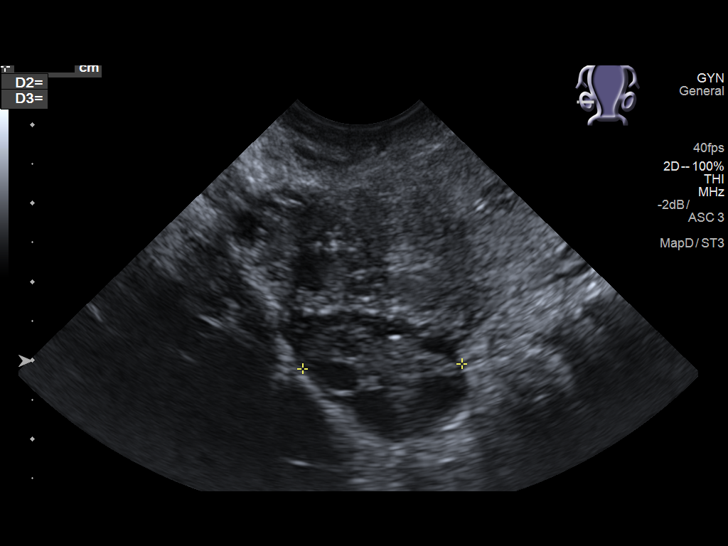
[im 71/86]
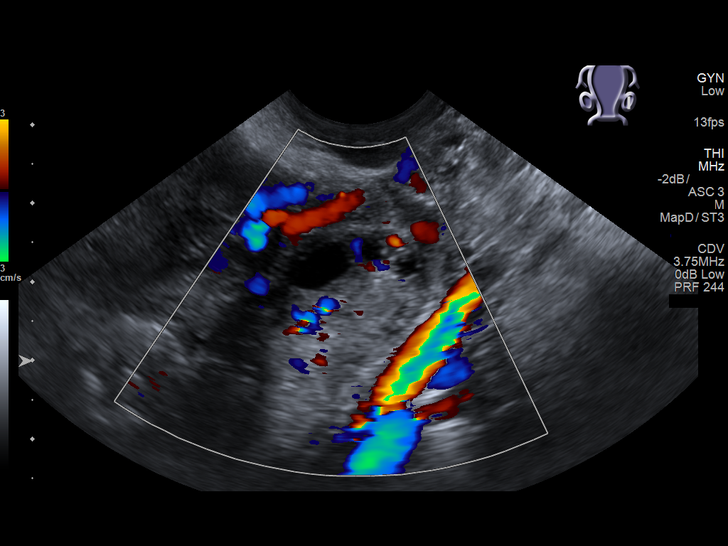
[im 78/86]
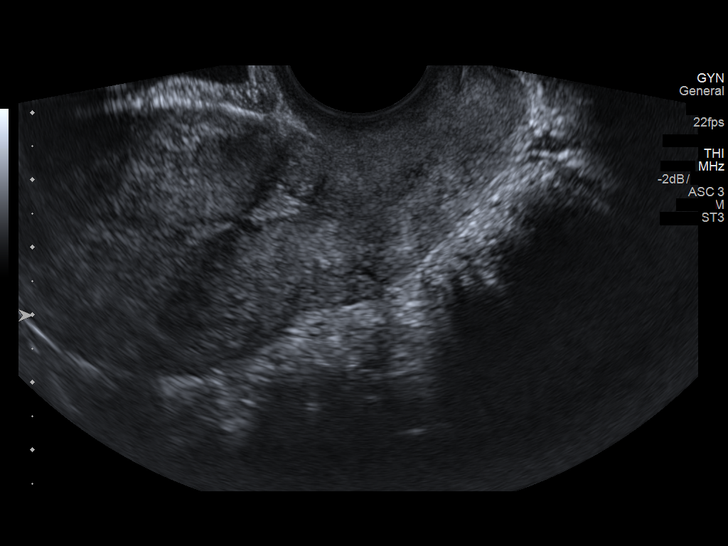
[im 86/86]
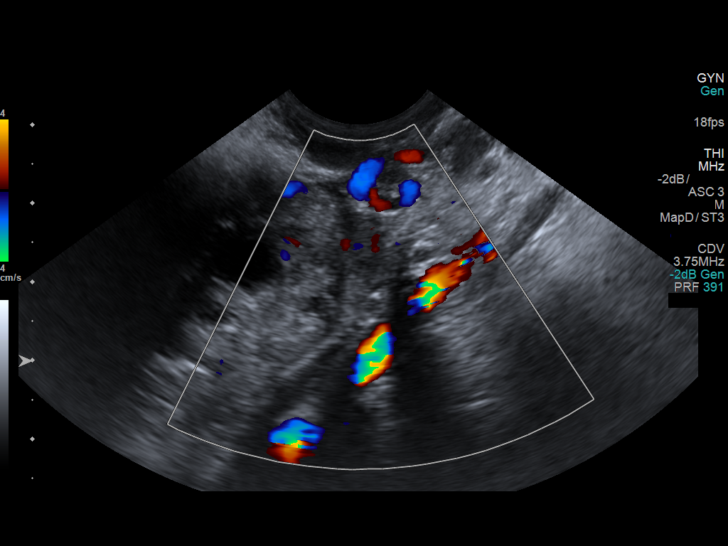

[13 of 25 positions shown; findings below may reference images not displayed]

FINDINGS: Uterus

Measurements: 7.7 x 3.9 x 5.3 cm. No fibroids or other mass
visualized. Small amount of fluid within the cervix.

Endometrium

Thickness: 0.2 cm.  No focal abnormality visualized.

Right ovary

Measurements: 2.5 x 1.6 x 2.0 cm. Normal appearance/no adnexal mass.

Left ovary

Measurements: 3.6 x 1.9 x 1.3 cm. Normal appearance/no adnexal mass.

Pulsed Doppler evaluation of both ovaries demonstrates normal
low-resistance arterial and venous waveforms.

Other findings

No abnormal free fluid.
IMPRESSION: Small amount of fluid within the cervix. Otherwise, normal
examination.

Normal appearance of the ovaries.  No evidence for ovarian torsion.

## 2020-06-13 ENCOUNTER — Other Ambulatory Visit (HOSPITAL_BASED_OUTPATIENT_CLINIC_OR_DEPARTMENT_OTHER): Payer: Self-pay

## 2020-06-13 DIAGNOSIS — R0683 Snoring: Secondary | ICD-10-CM

## 2020-06-25 ENCOUNTER — Other Ambulatory Visit: Payer: Self-pay

## 2020-06-25 ENCOUNTER — Encounter (HOSPITAL_COMMUNITY): Payer: Self-pay

## 2020-06-25 ENCOUNTER — Emergency Department (HOSPITAL_COMMUNITY)
Admission: EM | Admit: 2020-06-25 | Discharge: 2020-06-25 | Disposition: A | Discharge status: Home / Self Care | Payer: Medicaid Other | Attending: Emergency Medicine | Admitting: Emergency Medicine

## 2020-06-25 DIAGNOSIS — R079 Chest pain, unspecified: Secondary | ICD-10-CM | POA: Diagnosis not present

## 2020-06-25 DIAGNOSIS — Z5321 Procedure and treatment not carried out due to patient leaving prior to being seen by health care provider: Secondary | ICD-10-CM | POA: Diagnosis not present

## 2020-06-25 LAB — CBC
HCT: 37.6 % (ref 36.0–46.0)
Hemoglobin: 12 g/dL (ref 12.0–15.0)
MCH: 26.5 pg (ref 26.0–34.0)
MCHC: 31.9 g/dL (ref 30.0–36.0)
MCV: 83 fL (ref 80.0–100.0)
Platelets: 170 10*3/uL (ref 150–400)
RBC: 4.53 MIL/uL (ref 3.87–5.11)
RDW: 14.7 % (ref 11.5–15.5)
WBC: 5.7 10*3/uL (ref 4.0–10.5)
nRBC: 0 % (ref 0.0–0.2)

## 2020-06-25 LAB — BASIC METABOLIC PANEL
Anion gap: 10 (ref 5–15)
BUN: 10 mg/dL (ref 6–20)
CO2: 23 mmol/L (ref 22–32)
Calcium: 9 mg/dL (ref 8.9–10.3)
Chloride: 102 mmol/L (ref 98–111)
Creatinine, Ser: 0.74 mg/dL (ref 0.44–1.00)
GFR, Estimated: >60 mL/min (ref >60)
Glucose, Bld: 103 mg/dL — ABNORMAL HIGH (ref 70–99)
Potassium: 3.9 mmol/L (ref 3.5–5.1)
Sodium: 135 mmol/L (ref 135–145)

## 2020-06-25 LAB — I-STAT BETA HCG BLOOD, ED (MC, WL, AP ONLY): I-stat hCG, quantitative: <5 m[IU]/mL (ref <5)

## 2020-06-25 LAB — TROPONIN I (HIGH SENSITIVITY): Troponin I (High Sensitivity): <2 ng/L (ref <18)

## 2020-06-25 NOTE — ED Notes (Signed)
Pt walked out and has not returned and I do not see pt outside or in lobby

## 2020-06-25 NOTE — ED Triage Notes (Signed)
Pt reports chest pain that started this morning after getting into an argument with someone. Denies any other symptoms. Pt a.o, resp e.u

## 2020-07-02 ENCOUNTER — Emergency Department (HOSPITAL_COMMUNITY)
Admission: EM | Admit: 2020-07-02 | Discharge: 2020-07-02 | Disposition: A | Discharge status: Home / Self Care | Payer: Medicaid Other | Attending: Emergency Medicine | Admitting: Emergency Medicine

## 2020-07-02 ENCOUNTER — Encounter (HOSPITAL_COMMUNITY): Payer: Self-pay

## 2020-07-02 ENCOUNTER — Other Ambulatory Visit: Payer: Self-pay

## 2020-07-02 DIAGNOSIS — Z5321 Procedure and treatment not carried out due to patient leaving prior to being seen by health care provider: Secondary | ICD-10-CM | POA: Diagnosis not present

## 2020-07-02 DIAGNOSIS — G43909 Migraine, unspecified, not intractable, without status migrainosus: Secondary | ICD-10-CM | POA: Insufficient documentation

## 2020-07-02 NOTE — ED Notes (Signed)
Screener brought labels to triage desk, states pt has not been present since she come in at 3. Will remove pt from system.

## 2020-07-02 NOTE — ED Triage Notes (Signed)
Pt BIB EMS from in front of grocery store. PT c/o migraine starting today after an argument with boyfriend. Pt states she has hx of seizures, migraines.

## 2020-07-24 ENCOUNTER — Other Ambulatory Visit: Payer: Self-pay

## 2020-07-24 ENCOUNTER — Ambulatory Visit: Payer: Medicaid Other | Attending: Nurse Practitioner | Admitting: Physical Therapy

## 2020-07-24 ENCOUNTER — Encounter: Payer: Self-pay | Admitting: Physical Therapy

## 2020-07-24 DIAGNOSIS — M6281 Muscle weakness (generalized): Secondary | ICD-10-CM | POA: Diagnosis not present

## 2020-07-24 DIAGNOSIS — R32 Unspecified urinary incontinence: Secondary | ICD-10-CM | POA: Insufficient documentation

## 2020-07-24 DIAGNOSIS — R252 Cramp and spasm: Secondary | ICD-10-CM | POA: Insufficient documentation

## 2020-07-24 NOTE — Therapy (Signed)
[facility] [address], [address] [place], [place], [address] Phone: [phone]   Fax:  [phone]  Physical Therapy Evaluation  Patient Details  Name: Lalanya Rufener MRN: [id number] Date of Birth: [birth year] Referring Provider (PT): Dr. Carmel Sacramento   Encounter Date: 07/24/2020   PT End of Session - 07/24/20 1125    Visit Number 1    Date for PT Re-Evaluation 10/16/20    Authorization Type Medicaid    PT Start Time 0845    PT Stop Time 0925    PT Time Calculation (min) 40 min    Activity Tolerance Patient tolerated treatment well    Behavior During Therapy [facility] for tasks assessed/performed           Past Medical History:  Diagnosis Date  . Anemia   . Anxiety   . Chlamydia   . Complication of anesthesia    ??, seizure with wisdom teeth  . Depression    not on meds, sees a therapist  . Family history of breast cancer   . Family history of colon cancer   . Infection    UTI  . Kidney stone    kidney stones  . Migraine   . PID (acute pelvic inflammatory disease) 2014  . PONV (postoperative nausea and vomiting)   . Psoriasis   . Seizures [facility])    July 2013 - [place]    Past Surgical History:  Procedure Laterality Date  . CESAREAN SECTION    . CESAREAN SECTION  06/06/2012   Procedure: CESAREAN SECTION;  Surgeon: Adam Phenix, MD;  Location: [facility];  Service: Obstetrics;  Laterality: N/A;  . CESAREAN SECTION N/A 03/30/2017   Procedure: REPEAT CESAREAN SECTION;  Surgeon: Lazaro Arms, MD;  Location: [facility] BIRTHING SUITES;  Service: Obstetrics;  Laterality: N/A;  . SKIN GRAFT     off abd, onto arm  . WISDOM TOOTH EXTRACTION      There were no vitals filed for this visit.    Subjective Assessment - 07/24/20 0858    Subjective Patient has frequent BV. She gets it 3 times per year and has it at this time. I have IC and still seeing the doctor for it.    Patient Stated Goals work on pain    Currently in  Pain? Yes    Pain Score 8     Pain Location Pelvis    Pain Orientation Mid    Pain Descriptors / Indicators Sharp;Dull    Pain Type Chronic pain    Pain Onset More than a month ago    Pain Frequency Constant    Aggravating Factors  sit too long, stand too long,    Pain Relieving Factors medicine, move around    Multiple Pain Sites No              [facility] PT Assessment - 07/24/20 0001      Assessment   Medical Diagnosis R10.2 pelvic pain    Referring Provider (PT) Dr. Carmel Sacramento    Onset Date/Surgical Date --   chronic   Prior Therapy yes      Precautions   Precautions None      Balance Screen   Has the patient fallen in the past 6 months No    Has the patient had a decrease in activity level because of a fear of falling?  No    Is the patient reluctant to leave their home because of a fear of falling?  No      Home Tourist information centre manager residence      Prior Function   Level of Independence Independent      Cognition   Overall Cognitive Status Within Functional Limits for tasks assessed      Observation/Other Assessments   Focus on Therapeutic Outcomes (FOTO)  UIQ-7 38; CRAIQ-7 0; POPIQ-7 57; PFIQ-7 95      Posture/Postural Control   Posture/Postural Control Postural limitations    Posture Comments increased abdominal mass      ROM / Strength   AROM / PROM / Strength AROM;Strength;PROM      PROM   Right Hip Flexion 105    Right Hip External Rotation  40    Left Hip Flexion 95    Left Hip External Rotation  50      Strength   Right Hip ABduction 4/5   painful   Right Hip ADduction 4/5    Left Hip ABduction 5/5    Left Hip ADduction 5/5                      Objective measurements completed on examination: See above findings.     Pelvic Floor Special Questions - 07/24/20 0001    Prior Pregnancies Yes    Number of Pregnancies 3    Number of C-Sections 3    Currently Sexually Active No    Urinary Leakage Yes     Activities that cause leaking Other;Laughing;Coughing    Other activities that cause leaking laying down    Urinary urgency --   does not get the urge to urinate   Fecal incontinence --   constipation due to pain medication   Exam Type Deferred   due to having bacterial vaginosis                     PT Short Term Goals - 07/24/20 1140      PT SHORT TERM GOAL #1   Title ind with initial HEP    Baseline not educated yet    Time 4    Period Weeks    Status New    Target Date 08/21/20      PT SHORT TERM GOAL #2   Title ---             PT Long Term Goals - 07/24/20 1142      PT LONG TERM GOAL #1   Title pt will be ind with advanced HEP for pain management    Baseline not educated yet    Time 12    Period Weeks    Status New    Target Date 10/16/20      PT LONG TERM GOAL #2   Title Pt will report  pain is </= 4/10 during daily activities so she can perform tasks such as sitting for 30 minutes    Baseline pain level is 9/10    Time 12    Period Weeks    Status New    Target Date 10/16/20      PT LONG TERM GOAL #3   Title Patient is able to stand for 15-20 minutes with weight shifting to wait on a line with pain level </= 4/10    Baseline pain level 9/10    Time 12    Period Weeks    Status New    Target Date 10/16/20      PT LONG TERM GOAL #4  Title pt will be able to perfrom functional transfers without increased pain due to increased core strength.    Baseline pain level 9/10    Time 12    Period Weeks    Status New    Target Date 10/16/20      PT LONG TERM GOAL #5   Title ----    Baseline ---                  Plan - 07/24/20 1126    Clinical Impression Statement Patient is a 30 year old female with pelvic pain and urinary leakage. Patient has interstitial cystitis and bacterial vaginosis. She is having a flare-up with her pain due to recurring Baterial vaginosis. Patient reports he pain level is 8/10 wiht standing and sitting too  long. Patient reduces her pain with medication and changing positions. Patient has weakness in bilateral hip abduction and adduction. Patient abdominal strength is 2/5. Patient has tenderness located in the abdomen especially lower. Patient has tenderness located on the levator ani and Obturator internist. Patient internal assessment of the pelvic floor was deferred due to currently having Bacterial Vaginosis.Internal assessment will be done after she recovers from the Bacterial Vaginosis. Patient reports she will go to urinate 1 time per day. She will leak urine with not doing anything, sneezing, laughing. Patient has decreased bilateral hip flexion and external rotation passively. Patient will benefit from skilled therapy to improve pelvic floor coordinaiton and reduce her pain.    Personal Factors and Comorbidities Comorbidity 2;Behavior Pattern;Fitness;Past/Current Experience;Time since onset of injury/illness/exacerbation    Comorbidities Interstitial Cystitis, recurring Bacterial Vaginosis, C-section 2x,    Examination-Activity Limitations Caring for Others;Continence;Toileting;Sit;Stand    Examination-Participation Restrictions Community Activity;Driving    Stability/Clinical Decision Making Evolving/Moderate complexity    Clinical Decision Making Moderate    Rehab Potential Excellent    PT Frequency 1x / week    PT Duration 12 weeks    PT Treatment/Interventions ADLs/Self Care Home Management;Biofeedback;Cryotherapy;Electrical Stimulation;Moist Heat;Neuromuscular re-education;Therapeutic exercise;Therapeutic activities;Patient/family education;Manual techniques    PT Next Visit Plan hip and pelvic stretches; assess the pelvic floor is Bacterial vaginosis is cleared, diaphragmatic breathing to elongate the pelvic floor, home manual work to perineal area    Consulted and Agree with Plan of Care Patient           Patient will benefit from skilled therapeutic intervention in order to improve  the following deficits and impairments:  Decreased coordination,Increased fascial restricitons,Pain,Decreased strength,Decreased activity tolerance,Increased muscle spasms,Decreased endurance  Visit Diagnosis: Muscle weakness (generalized) - Plan: PT plan of care cert/re-cert  Cramp and spasm - Plan: PT plan of care cert/re-cert  Urinary incontinence, unspecified type - Plan: PT plan of care cert/re-cert     Problem List Patient Active Problem List   Diagnosis Date Noted  . Genetic testing 03/07/2020  . Family history of breast cancer   . Family history of colon cancer   . Supervision of low-risk pregnancy 11/23/2018  . Chronic PID (chronic pelvic inflammatory disease) 12/13/2017  . Status post repeat low transverse cesarean section 03/30/2017  . Pelvic inflammatory disease (PID) 02/21/2013  . Migraines 07/24/2012  . Previous cesarean delivery, antepartum condition or complication 03/30/2012  . Obese 03/30/2012  . Seizure disorder (HCC) 02/03/2012  . Family history of breast cancer in mother 02/03/2012    Eulis FosterCheryl Jalyn Rosero, PT 07/24/20 11:46 AM   [facility] [address], [address] [place], [place], [address] Phone: [phone]   Fax:  [phone]  Name: Anjela Cassara MRN: [id number] Date of Birth: 07/08/1991

## 2020-08-02 ENCOUNTER — Ambulatory Visit (HOSPITAL_BASED_OUTPATIENT_CLINIC_OR_DEPARTMENT_OTHER): Payer: Medicaid Other | Attending: Nurse Practitioner

## 2020-08-06 ENCOUNTER — Ambulatory Visit: Payer: Medicaid Other | Admitting: Physical Therapy

## 2020-08-08 ENCOUNTER — Encounter (HOSPITAL_COMMUNITY): Payer: Self-pay | Admitting: Emergency Medicine

## 2020-08-08 ENCOUNTER — Emergency Department (HOSPITAL_COMMUNITY)
Admission: EM | Admit: 2020-08-08 | Discharge: 2020-08-08 | Disposition: A | Discharge status: Home / Self Care | Payer: Medicaid Other | Attending: Emergency Medicine | Admitting: Emergency Medicine

## 2020-08-08 ENCOUNTER — Other Ambulatory Visit: Payer: Self-pay

## 2020-08-08 ENCOUNTER — Emergency Department (HOSPITAL_COMMUNITY): Payer: Medicaid Other

## 2020-08-08 DIAGNOSIS — N202 Calculus of kidney with calculus of ureter: Secondary | ICD-10-CM | POA: Diagnosis not present

## 2020-08-08 DIAGNOSIS — Z20822 Contact with and (suspected) exposure to covid-19: Secondary | ICD-10-CM | POA: Insufficient documentation

## 2020-08-08 DIAGNOSIS — R102 Pelvic and perineal pain: Secondary | ICD-10-CM

## 2020-08-08 DIAGNOSIS — N200 Calculus of kidney: Secondary | ICD-10-CM

## 2020-08-08 LAB — URINALYSIS, ROUTINE W REFLEX MICROSCOPIC
Bacteria, UA: NONE SEEN
Bilirubin Urine: NEGATIVE
Glucose, UA: NEGATIVE mg/dL
Ketones, ur: NEGATIVE mg/dL
Leukocytes,Ua: NEGATIVE
Nitrite: NEGATIVE
Protein, ur: 30 mg/dL — AB
RBC / HPF: >50 RBC/hpf — ABNORMAL HIGH (ref 0–5)
Specific Gravity, Urine: 1.023 (ref 1.005–1.030)
pH: 7 (ref 5.0–8.0)

## 2020-08-08 LAB — BASIC METABOLIC PANEL
Anion gap: 11 (ref 5–15)
BUN: 8 mg/dL (ref 6–20)
CO2: 24 mmol/L (ref 22–32)
Calcium: 9.2 mg/dL (ref 8.9–10.3)
Chloride: 103 mmol/L (ref 98–111)
Creatinine, Ser: 0.79 mg/dL (ref 0.44–1.00)
GFR, Estimated: >60 mL/min (ref >60)
Glucose, Bld: 120 mg/dL — ABNORMAL HIGH (ref 70–99)
Potassium: 4 mmol/L (ref 3.5–5.1)
Sodium: 138 mmol/L (ref 135–145)

## 2020-08-08 LAB — HEPATIC FUNCTION PANEL
ALT: 21 U/L (ref 0–44)
AST: 18 U/L (ref 15–41)
Albumin: 3.7 g/dL (ref 3.5–5.0)
Alkaline Phosphatase: 66 U/L (ref 38–126)
Bilirubin, Direct: <0.1 mg/dL (ref 0.0–0.2)
Total Bilirubin: 0.4 mg/dL (ref 0.3–1.2)
Total Protein: 7.7 g/dL (ref 6.5–8.1)

## 2020-08-08 LAB — WET PREP, GENITAL
Sperm: NONE SEEN
Trich, Wet Prep: NONE SEEN
Yeast Wet Prep HPF POC: NONE SEEN

## 2020-08-08 LAB — CBC
HCT: 39.3 % (ref 36.0–46.0)
Hemoglobin: 12.7 g/dL (ref 12.0–15.0)
MCH: 26.6 pg (ref 26.0–34.0)
MCHC: 32.3 g/dL (ref 30.0–36.0)
MCV: 82.4 fL (ref 80.0–100.0)
Platelets: 236 10*3/uL (ref 150–400)
RBC: 4.77 MIL/uL (ref 3.87–5.11)
RDW: 14.4 % (ref 11.5–15.5)
WBC: 5.6 10*3/uL (ref 4.0–10.5)
nRBC: 0 % (ref 0.0–0.2)

## 2020-08-08 LAB — I-STAT BETA HCG BLOOD, ED (MC, WL, AP ONLY): I-stat hCG, quantitative: <5 m[IU]/mL (ref <5)

## 2020-08-08 LAB — SARS CORONAVIRUS 2 (TAT 6-24 HRS): SARS Coronavirus 2: NEGATIVE

## 2020-08-08 MED ORDER — SODIUM CHLORIDE 0.9 % IV BOLUS
1000.0000 mL | Freq: Once | INTRAVENOUS | Status: AC
  Administered 2020-08-08: 1000 mL via INTRAVENOUS

## 2020-08-08 MED ORDER — HYDROMORPHONE HCL 1 MG/ML IJ SOLN
1.0000 mg | Freq: Once | INTRAMUSCULAR | Status: AC
  Administered 2020-08-08: 1 mg via INTRAVENOUS
  Filled 2020-08-08: qty 1

## 2020-08-08 MED ORDER — FENTANYL CITRATE (PF) 100 MCG/2ML IJ SOLN
50.0000 ug | Freq: Once | INTRAMUSCULAR | Status: AC
  Administered 2020-08-08: 50 ug via INTRAVENOUS
  Filled 2020-08-08: qty 2

## 2020-08-08 MED ORDER — ONDANSETRON HCL 4 MG/2ML IJ SOLN
4.0000 mg | Freq: Once | INTRAMUSCULAR | Status: AC
  Administered 2020-08-08: 4 mg via INTRAVENOUS
  Filled 2020-08-08: qty 2

## 2020-08-08 MED ORDER — IOHEXOL 300 MG/ML  SOLN
85.0000 mL | Freq: Once | INTRAMUSCULAR | Status: AC | PRN
  Administered 2020-08-08: 85 mL via INTRAVENOUS

## 2020-08-08 MED ORDER — TAMSULOSIN HCL 0.4 MG PO CAPS: 0.4000 mg | ORAL_CAPSULE | Freq: Every day | ORAL | 0 refills | Status: AC

## 2020-08-08 NOTE — Discharge Instructions (Addendum)
Your work-up today was overall reassuring, though it did show that you have a kidney stone on the right side.  Please take the medication Flomax to help pass your kidney stone.  Please make sure to follow-up with urology, as well as at the OB/GYN clinic provided.  Return to the ER for any new or worsening symptoms.

## 2020-08-08 NOTE — ED Provider Notes (Signed)
[facility] EMERGENCY DEPARTMENT Provider Note   CSN: [id number] Arrival date & time: 08/08/20  0746     History Chief Complaint  Patient presents with  . Pelvic Pain    Chloe Rodgers is a 30 y.o. female.  HPI :-year-old female with history anxiety, chlamydia, UTI, renal stone, PID presents to the ER with complaints of pelvic pain that has been ongoing since November.  Reports that the pain is constant, sharp and stabbing.  She reports whitish/yellowish discharge which has been ongoing, and scant blood.  She is currently not on her menstrual cycle.  She denies any birth control.  She reports pain with sex, and has not been sexually active since November due to this.  Patient reports some right-sided flank pain.  No dysuria or hematuria.  She reports nausea and vomiting which has been ongoing for the last 3 days.  Vomit is yellow with "some blood".  She also reports a fever of 102 yesterday.  She was seen at urgent care per chart review back in November, was treated for BV and GC chlamydia.  She reports compliance with these medications but states that her symptoms did not improve at all.  She has not seen her OB/GYN.  Denies any chest pain or shortness of breath.  Known nasal congestion, cough.  Past Medical History:  Diagnosis Date  . Anemia   . Anxiety   . Chlamydia   . Complication of anesthesia    ??, seizure with wisdom teeth  . Depression    not on meds, sees a therapist  . Family history of breast cancer   . Family history of colon cancer   . Infection    UTI  . Kidney stone    kidney stones  . Migraine   . PID (acute pelvic inflammatory disease) 2014  . PONV (postoperative nausea and vomiting)   . Psoriasis   . Seizures [facility])    July 2013 - [place]    Patient Active Problem List   Diagnosis Date Noted  . Genetic testing 03/07/2020  . Family history of breast cancer   . Family history of colon cancer   . Supervision of low-risk pregnancy  11/23/2018  . Chronic PID (chronic pelvic inflammatory disease) 12/13/2017  . Status post repeat low transverse cesarean section 03/30/2017  . Pelvic inflammatory disease (PID) 02/21/2013  . Migraines 07/24/2012  . Previous cesarean delivery, antepartum condition or complication 03/30/2012  . Obese 03/30/2012  . Seizure disorder (HCC) 02/03/2012  . Family history of breast cancer in mother 02/03/2012    Past Surgical History:  Procedure Laterality Date  . CESAREAN SECTION    . CESAREAN SECTION  06/06/2012   Procedure: CESAREAN SECTION;  Surgeon: Adam Phenix, MD;  Location: [facility] ORS;  Service: Obstetrics;  Laterality: N/A;  . CESAREAN SECTION N/A 03/30/2017   Procedure: REPEAT CESAREAN SECTION;  Surgeon: Lazaro Arms, MD;  Location: [facility] BIRTHING SUITES;  Service: Obstetrics;  Laterality: N/A;  . SKIN GRAFT     off abd, onto arm  . WISDOM TOOTH EXTRACTION       OB History    Gravida  6   Para  3   Term  3   Preterm  0   AB  2   Living  3     SAB  0   IAB  1   Ectopic  0   Multiple  0   Live Births  3  Family History  Problem Relation Age of Onset  . Hypertension Mother   . Diabetes Mother   . Stroke Mother   . Seizures Mother   . Breast cancer Mother        dx in her early 2730s  . Asthma Daughter   . Hypertension Maternal Grandmother   . Diabetes Maternal Grandmother   . Cancer Maternal Grandmother        BONE CANCER  . Breast cancer Maternal Grandmother        dx in her early 5530s  . Colon cancer Maternal Grandmother        dx in her 5740s  . Dementia Paternal Grandmother   . Other Neg Hx     Social History   Tobacco Use  . Smoking status: Never Smoker  . Smokeless tobacco: Never Used  Vaping Use  . Vaping Use: Never used  Substance Use Topics  . Alcohol use: Yes    Comment: occ  . Drug use: No    Home Medications Prior to Admission medications   Medication Sig Start Date End Date Taking? Authorizing Provider  tamsulosin  (FLOMAX) 0.4 MG CAPS capsule Take 1 capsule (0.4 mg total) by mouth daily for 15 days. 08/08/20 08/23/20 Yes Mare FerrariBelaya, Demmi Sindt A, PA-C  acetaminophen (TYLENOL) 325 MG tablet Take 2 tablets (650 mg total) by mouth every 6 (six) hours as needed. Patient not taking: Reported on 07/24/2020 12/27/19   Merrilee JanskyLamptey, Philip O, MD  bacitracin ointment Apply 1 application topically 2 (two) times daily. Patient not taking: Reported on 07/24/2020 12/27/19   Merrilee JanskyLamptey, Philip O, MD  doxycycline (VIBRA-TABS) 100 MG tablet Take 1 tablet (100 mg total) by mouth 2 (two) times daily. Patient not taking: Reported on 07/24/2020 05/13/20   Particia NearingLane, Rachel Elizabeth, PA-C  metroNIDAZOLE (FLAGYL) 500 MG tablet Take 1 tablet (500 mg total) by mouth 2 (two) times daily. 05/13/20   Particia NearingLane, Rachel Elizabeth, PA-C  ondansetron (ZOFRAN ODT) 4 MG disintegrating tablet Take 1 tablet (4 mg total) by mouth every 8 (eight) hours as needed for nausea or vomiting. Patient not taking: Reported on 07/24/2020 05/13/20   Particia NearingLane, Rachel Elizabeth, PA-C  oxyCODONE ER [facility](XTAMPZA ER) 18 MG C12A Take by mouth.    [provider]  Oxycodone HCl 10 MG TABS Take 10 mg by mouth 3 (three) times daily. 04/21/20   [provider]  promethazine (PHENERGAN) 25 MG tablet Take 1 tablet (25 mg total) by mouth every 6 (six) hours as needed for nausea or vomiting. 11/27/18 12/27/19  Hermina StaggersErvin, Michael L, MD    Allergies    Naproxen and Toradol [ketorolac tromethamine]  Review of Systems   Review of Systems  Constitutional: Positive for chills and fever.  HENT: Negative for ear pain and sore throat.   Eyes: Negative for pain and visual disturbance.  Respiratory: Negative for cough and shortness of breath.   Cardiovascular: Negative for chest pain and palpitations.  Gastrointestinal: Positive for nausea and vomiting. Negative for abdominal pain.  Genitourinary: Positive for flank pain, pelvic pain, vaginal bleeding, vaginal discharge and vaginal pain. Negative for  decreased urine volume, difficulty urinating, dyspareunia, dysuria, genital sores, hematuria and menstrual problem.  Musculoskeletal: Negative for arthralgias and back pain.  Skin: Negative for color change and rash.  Neurological: Negative for seizures and syncope.  All other systems reviewed and are negative.   Physical Exam Updated Vital Signs BP 124/77   Pulse 73   Temp 98.5 F (36.9 C) (Oral)  Resp 18   Ht 5\' 6"  (1.676 m)   Wt 116.6 kg   SpO2 99%   BMI 41.48 kg/m   Physical Exam Vitals and nursing note reviewed. Exam conducted with a chaperone present.  Constitutional:      General: She is not in acute distress.    Appearance: She is well-developed and well-nourished. She is obese.     Comments: Uncomfortable appearing  HENT:     Head: Normocephalic and atraumatic.  Eyes:     Conjunctiva/sclera: Conjunctivae normal.  Cardiovascular:     Rate and Rhythm: Normal rate and regular rhythm.     Heart sounds: No murmur heard.   Pulmonary:     Effort: Pulmonary effort is normal. No respiratory distress.     Breath sounds: Normal breath sounds.  Abdominal:     Palpations: Abdomen is soft.     Tenderness: There is abdominal tenderness. There is right CVA tenderness. There is no left CVA tenderness.     Comments: Tenderness and guarding to the pelvic region and epigastric regions of the abdomen.  Mild flank tenderness on the right.  Genitourinary:    Exam position: Supine.     Labia:        Right: No tenderness.      Comments: Yellow/white odorous discharge, patient very tender with speculum insertion, cervix and vaginal walls erythematous, positive cervical motion bilateral adnexal tenderness Musculoskeletal:        General: No edema. Normal range of motion.     Cervical back: Neck supple.  Skin:    General: Skin is warm and dry.     Capillary Refill: Capillary refill takes less than 2 seconds.  Neurological:     General: No focal deficit present.     Mental Status:  She is alert and oriented to person, place, and time.  Psychiatric:        Mood and Affect: Mood and affect and mood normal.        Behavior: Behavior normal.     ED Results / Procedures / Treatments   Labs (all labs ordered are listed, but only abnormal results are displayed) Labs Reviewed  WET PREP, GENITAL - Abnormal; Notable for the following components:      Result Value   Clue Cells Wet Prep HPF POC PRESENT (*)    WBC, Wet Prep HPF POC MANY (*)    All other components within normal limits  BASIC METABOLIC PANEL - Abnormal; Notable for the following components:   Glucose, Bld 120 (*)    All other components within normal limits  URINALYSIS, ROUTINE W REFLEX MICROSCOPIC - Abnormal; Notable for the following components:   APPearance HAZY (*)    Hgb urine dipstick LARGE (*)    Protein, ur 30 (*)    RBC / HPF >50 (*)    All other components within normal limits  SARS CORONAVIRUS 2 (TAT 6-24 HRS)  CBC  HEPATIC FUNCTION PANEL  I-STAT BETA HCG BLOOD, ED ([facility], [facility], [facility] ONLY)  GC/CHLAMYDIA PROBE AMP ([place]) NOT AT [facility]    EKG None  Radiology CT ABDOMEN PELVIS W CONTRAST  Result Date: 08/08/2020 CLINICAL DATA:  Epigastric pain. EXAM: CT ABDOMEN AND PELVIS WITH CONTRAST TECHNIQUE: Multidetector CT imaging of the abdomen and pelvis was performed using the standard protocol following bolus administration of intravenous contrast. CONTRAST:  37mL OMNIPAQUE IOHEXOL 300 MG/ML  SOLN COMPARISON:  05/05/2018 FINDINGS: Lower chest: No acute abnormality. Hepatobiliary: No focal liver abnormality is seen. No  gallstones, gallbladder wall thickening, or biliary dilatation. Pancreas: Unremarkable. No pancreatic ductal dilatation or surrounding inflammatory changes. Spleen: Normal in size without focal abnormality. Adrenals/Urinary Tract: Normal adrenal glands. Punctate stone within upper pole of right kidney is noted, image 28/3. Mild right hydronephrosis is noted. There is a stone within the  proximal ureter just beyond the UPJ which measures 3 mm in maximum dimension. Left kidney is unremarkable. The urinary bladder is unremarkable. Stomach/Bowel: Stomach is within normal limits. Appendix appears normal. No evidence of bowel wall thickening, distention, or inflammatory changes. Vascular/Lymphatic: No significant vascular findings are present. No enlarged abdominal or pelvic lymph nodes. Reproductive: Uterus and bilateral adnexa are unremarkable. Other: No free fluid or fluid collections. Musculoskeletal: No acute or significant osseous findings. IMPRESSION: Right-sided hydronephrosis secondary to 3 mm proximal right ureteral calculus. Electronically Signed   By: Signa Kell M.D.   On: 08/08/2020 11:28   US PELVIC COMPLETE W TRANSVAGINAL AND TORSION R/O  Result Date: 08/08/2020 CLINICAL DATA:  Bilateral pelvic pain for 3 months EXAM: TRANSABDOMINAL AND TRANSVAGINAL ULTRASOUND OF PELVIS DOPPLER ULTRASOUND OF OVARIES TECHNIQUE: Both transabdominal and transvaginal ultrasound examinations of the pelvis were performed. Transabdominal technique was performed for global imaging of the pelvis including uterus, ovaries, adnexal regions, and pelvic cul-de-sac. It was necessary to proceed with endovaginal exam following the transabdominal exam to visualize the ovaries. Color and duplex Doppler ultrasound was utilized to evaluate blood flow to the ovaries. COMPARISON:  04/21/2018 FINDINGS: Uterus Measurements: 9.4 x 5.1 x 7.3 cm = volume: 183 mL. No fibroids or other mass visualized. Endometrium Thickness: 5 mm.  No focal abnormality visualized. Right ovary Measurements: 2.8 x 1.6 x 1.9 cm = volume: 5 mL. Normal appearance/no adnexal mass. Left ovary Measurements: 2.7 x 2.3 x 2.3 cm = volume: 8 mL. Normal appearance/no adnexal mass. Pulsed Doppler evaluation of both ovaries demonstrates normal low-resistance arterial and venous waveforms. Other findings No abnormal free fluid. IMPRESSION: Negative pelvic  ultrasound.  No evidence of adnexal torsion. Electronically Signed   By: Duanne Guess D.O.   On: 08/08/2020 11:17    Procedures Procedures   Medications Ordered in ED Medications  fentaNYL (SUBLIMAZE) injection 50 mcg (50 mcg Intravenous Given 08/08/20 0943)  ondansetron (ZOFRAN) injection 4 mg (4 mg Intravenous Given 08/08/20 0943)  sodium chloride 0.9 % bolus 1,000 mL (0 mLs Intravenous Stopped 08/08/20 1218)  iohexol (OMNIPAQUE) 300 MG/ML solution 85 mL (85 mLs Intravenous Contrast Given 08/08/20 1109)  HYDROmorphone (DILAUDID) injection 1 mg (1 mg Intravenous Given 08/08/20 1219)    ED Course  I have reviewed the triage vital signs and the nursing notes.  Pertinent labs & imaging results that were available during my care of the patient were reviewed by me and considered in my medical decision making (see chart for details).    MDM Rules/Calculators/A&P                          30 year old female who presents to the ER with right flank pain and pelvic pain which has been ongoing for over a month.  On arrival, vitals reassuring, afebrile, blood pressure slightly elevated at 152/105, however this improved throughout the ED course.  Not tachycardic or hypoxic.  Physical exam with right-sided flank tenderness, diffuse abdominal tenderness, pelvic exam with surgical motion tenderness and bilateral adnexal tenderness.  Exam was limited as the patient had significant amount of pain with entrance of the speculum.  DDx includes renal stones, PID,  TOA, ovarian torsion, fibroids, endometriosis, appendicitis, diverticulitis  Labs ordered, reviewed and interpreted by me -CBC without leukocytosis, normal hemoglobin -BMP without any significant electrode abnormalities, normal creatinine -Pregnancy negative -UA with large amount hemoglobin, proteinuria, more than 50 RBCs.  Patient states she is not currently on her menstrual cycle. -Prep with clue cells and many WBCs -Covid test  pending -Hepatic function panel normal   Imaging ordered reviewed and interpreted by me -Pelvic ultrasound without any significant abnormalities -CT of the abdomen with right 3 mm renal stone with some right-sided hydronephrosis  MDM: Patient received nausea medicine, fentanyl, fluids.  Still reports some right-sided flank pain.  Will give Dilaudid to attempt to control the pain.  If we can achieve adequate pain control, plan to discharge with pain medicines, Flomax, urology follow-up, OB/GYN follow-up.  PDMP reviewed, patient receives 30-day supply of oxycodone.  Find the patient that reports I cannot prescribe her any additional opioids, but she can take her oxycodone for pain.  She voiced understanding and is agreeable.  Patient was treated for STDs at her visit at urgent care with Cipro and doxy, was also given Flagyl for BV.  Her gonorrhea and Chlamydia tests again were normal.  Patient reports that she has not had any sexual encounters since her urgent care visit as she has had significant pain with sex since then.  Despite having some cervical motion tenderness and bilateral adnexal tenderness, my suspicion for PID is low at this time. I believe her pain could be due to vaginismus.  She was already treated for BV at her last visit, suspect she has chronic BV.  Will hold treatment at this time.  Discussed this case with Dr. Adela Lank who is agreeable to the plan and disposition  All the patient's questions have been answered to her satisfaction, she voiced understanding is agreeable to this plan.  Return precautions discussed.  At this stage in the ED course, the patient is medically screened and stable for discharge   Final Clinical Impression(s) / ED Diagnoses Final diagnoses:  Pelvic pain  Right kidney stone    Rx / DC Orders ED Discharge Orders         Ordered    tamsulosin (FLOMAX) 0.4 MG CAPS capsule  Daily        08/08/20 1227           Mare Ferrari, PA-C 08/08/20 1235     Melene Plan, DO 08/08/20 1242

## 2020-08-08 NOTE — ED Triage Notes (Signed)
Pt reports ongoing pelvic and R flank pain x1 month, treated for BV at [facility] and told to return if symptoms worsened. Also endorses nausea and fevers at home.

## 2020-08-09 ENCOUNTER — Emergency Department (HOSPITAL_COMMUNITY)
Admission: EM | Admit: 2020-08-09 | Discharge: 2020-08-10 | Disposition: A | Discharge status: Home / Self Care | Payer: Medicaid Other | Attending: Emergency Medicine | Admitting: Emergency Medicine

## 2020-08-09 DIAGNOSIS — N200 Calculus of kidney: Secondary | ICD-10-CM | POA: Diagnosis not present

## 2020-08-09 DIAGNOSIS — R109 Unspecified abdominal pain: Secondary | ICD-10-CM | POA: Diagnosis present

## 2020-08-09 LAB — CBC
HCT: 39.3 % (ref 36.0–46.0)
Hemoglobin: 12.6 g/dL (ref 12.0–15.0)
MCH: 26.3 pg (ref 26.0–34.0)
MCHC: 32.1 g/dL (ref 30.0–36.0)
MCV: 81.9 fL (ref 80.0–100.0)
Platelets: 247 10*3/uL (ref 150–400)
RBC: 4.8 MIL/uL (ref 3.87–5.11)
RDW: 14.2 % (ref 11.5–15.5)
WBC: 10.5 10*3/uL (ref 4.0–10.5)
nRBC: 0 % (ref 0.0–0.2)

## 2020-08-09 LAB — I-STAT BETA HCG BLOOD, ED (MC, WL, AP ONLY): I-stat hCG, quantitative: <5 m[IU]/mL (ref <5)

## 2020-08-09 LAB — BASIC METABOLIC PANEL
Anion gap: 14 (ref 5–15)
BUN: 8 mg/dL (ref 6–20)
CO2: 22 mmol/L (ref 22–32)
Calcium: 8.8 mg/dL — ABNORMAL LOW (ref 8.9–10.3)
Chloride: 104 mmol/L (ref 98–111)
Creatinine, Ser: 0.85 mg/dL (ref 0.44–1.00)
GFR, Estimated: >60 mL/min (ref >60)
Glucose, Bld: 138 mg/dL — ABNORMAL HIGH (ref 70–99)
Potassium: 3.3 mmol/L — ABNORMAL LOW (ref 3.5–5.1)
Sodium: 140 mmol/L (ref 135–145)

## 2020-08-09 MED ORDER — OXYCODONE-ACETAMINOPHEN 5-325 MG PO TABS
1.0000 | ORAL_TABLET | ORAL | Status: DC | PRN
  Administered 2020-08-09: 1 via ORAL
  Filled 2020-08-09: qty 1

## 2020-08-09 NOTE — ED Triage Notes (Signed)
Pt presents to ED POV. Pt c/o R flank pain. Pt reports that she was seen here yesterday and diagnosed w/ kidney stones and told that if pain worsens come back. Pt reports hematuria and hematemesis.

## 2020-08-10 LAB — URINALYSIS, ROUTINE W REFLEX MICROSCOPIC
Bilirubin Urine: NEGATIVE
Glucose, UA: NEGATIVE mg/dL
Ketones, ur: NEGATIVE mg/dL
Leukocytes,Ua: NEGATIVE
Nitrite: NEGATIVE
Protein, ur: NEGATIVE mg/dL
RBC / HPF: >50 RBC/hpf — ABNORMAL HIGH (ref 0–5)
Specific Gravity, Urine: 1.012 (ref 1.005–1.030)
pH: 7 (ref 5.0–8.0)

## 2020-08-10 MED ORDER — SODIUM CHLORIDE 0.9 % IV BOLUS
1000.0000 mL | Freq: Once | INTRAVENOUS | Status: AC
  Administered 2020-08-10: 1000 mL via INTRAVENOUS

## 2020-08-10 MED ORDER — HYDROMORPHONE HCL 1 MG/ML IJ SOLN
1.0000 mg | Freq: Once | INTRAMUSCULAR | Status: AC
  Administered 2020-08-10: 1 mg via INTRAVENOUS
  Filled 2020-08-10: qty 1

## 2020-08-10 MED ORDER — TAMSULOSIN HCL 0.4 MG PO CAPS
0.4000 mg | ORAL_CAPSULE | Freq: Once | ORAL | Status: AC
  Administered 2020-08-10: 0.4 mg via ORAL
  Filled 2020-08-10: qty 1

## 2020-08-10 MED ORDER — SODIUM CHLORIDE 0.9 % IV SOLN
1.5000 mg/kg | Freq: Once | INTRAVENOUS | Status: AC
  Administered 2020-08-10: 174 mg via INTRAVENOUS
  Filled 2020-08-10: qty 8.7

## 2020-08-10 MED ORDER — ONDANSETRON HCL 4 MG/2ML IJ SOLN
4.0000 mg | Freq: Once | INTRAMUSCULAR | Status: AC
  Administered 2020-08-10: 4 mg via INTRAVENOUS
  Filled 2020-08-10: qty 2

## 2020-08-10 MED ORDER — OXYCODONE-ACETAMINOPHEN 5-325 MG PO TABS
2.0000 | ORAL_TABLET | Freq: Once | ORAL | Status: AC
  Administered 2020-08-10: 2 via ORAL
  Filled 2020-08-10: qty 2

## 2020-08-10 MED ORDER — METOCLOPRAMIDE HCL 5 MG/ML IJ SOLN
10.0000 mg | Freq: Once | INTRAMUSCULAR | Status: AC
  Administered 2020-08-10: 10 mg via INTRAVENOUS
  Filled 2020-08-10: qty 2

## 2020-08-10 MED ORDER — HYDROMORPHONE HCL 1 MG/ML IJ SOLN
1.0000 mg | Freq: Once | INTRAMUSCULAR | Status: AC
Start: 2020-08-10 — End: 2020-08-10
  Administered 2020-08-10: 1 mg via INTRAVENOUS
  Filled 2020-08-10: qty 1

## 2020-08-10 MED ORDER — METOCLOPRAMIDE HCL 10 MG PO TABS: 10.0000 mg | ORAL_TABLET | Freq: Four times a day (QID) | ORAL | 0 refills | Status: DC | PRN

## 2020-08-10 NOTE — ED Notes (Signed)
Patient reports medications have not provided any relief in symptoms. Attending made aware. Patient updated.

## 2020-08-10 NOTE — ED Provider Notes (Signed)
[facility] EMERGENCY DEPARTMENT Provider Note   CSN: [id number] Arrival date & time: 08/09/20  2032     History Chief Complaint  Patient presents with   Flank Pain    Chloe Rodgers is a 30 y.o. female.  30 year old female who presents to the emergency department today with persistent right-sided pain. Patient was seen in emergency room yesterday and diagnosed with a right-sided proximal ureteral stone. She was treated with pain medication and antiemetics and fluids here. She went home and started have persistent vomiting again. Did not have any nausea medicine at home. When I able to keep her pain medicine down. She has had some blood-streaked emesis since that time but no frank hematemesis. No fevers. She states that she has a pain contract and thus is not able to receive a higher prescription for pain medication. decreased urination and intake during this time. no fevers. pain not really worsened, just not better. thought to be 2/2 not keeping medications down.    Flank Pain       Past Medical History:  Diagnosis Date   Anemia    Anxiety    Chlamydia    Complication of anesthesia    ??, seizure with wisdom teeth   Depression    not on meds, sees a therapist   Family history of breast cancer    Family history of colon cancer    Infection    UTI   Kidney stone    kidney stones   Migraine    PID (acute pelvic inflammatory disease) 2014   PONV (postoperative nausea and vomiting)    Psoriasis    Seizures [facility])    July 2013 - [place]    Patient Active Problem List   Diagnosis Date Noted   Genetic testing 03/07/2020   Family history of breast cancer    Family history of colon cancer    Supervision of low-risk pregnancy 11/23/2018   Chronic PID (chronic pelvic inflammatory disease) 12/13/2017   Status post repeat low transverse cesarean section 03/30/2017   Pelvic inflammatory disease (PID) 02/21/2013   Migraines  07/24/2012   Previous cesarean delivery, antepartum condition or complication 03/30/2012   Obese 03/30/2012   Seizure disorder (HCC) 02/03/2012   Family history of breast cancer in mother 02/03/2012    Past Surgical History:  Procedure Laterality Date   CESAREAN SECTION     CESAREAN SECTION  06/06/2012   Procedure: CESAREAN SECTION;  Surgeon: Adam Phenix, MD;  Location: [facility];  Service: Obstetrics;  Laterality: N/A;   CESAREAN SECTION N/A 03/30/2017   Procedure: REPEAT CESAREAN SECTION;  Surgeon: Lazaro Arms, MD;  Location: [facility] BIRTHING SUITES;  Service: Obstetrics;  Laterality: N/A;   SKIN GRAFT     off abd, onto arm   WISDOM TOOTH EXTRACTION       OB History    Gravida  6   Para  3   Term  3   Preterm  0   AB  2   Living  3     SAB  0   IAB  1   Ectopic  0   Multiple  0   Live Births  3           Family History  Problem Relation Age of Onset   Hypertension Mother    Diabetes Mother    Stroke Mother    Seizures Mother    Breast cancer Mother        dx in  her early 56s   Asthma Daughter    Hypertension Maternal Grandmother    Diabetes Maternal Grandmother    Cancer Maternal Grandmother        BONE CANCER   Breast cancer Maternal Grandmother        dx in her early 61s   Colon cancer Maternal Grandmother        dx in her 43s   Dementia Paternal Grandmother    Other Neg Hx     Social History   Tobacco Use   Smoking status: Never Smoker   Smokeless tobacco: Never Used  Building services engineer Use: Never used  Substance Use Topics   Alcohol use: Yes    Comment: occ   Drug use: No    Home Medications Prior to Admission medications   Medication Sig Start Date End Date Taking? Authorizing Provider  metoCLOPramide (REGLAN) 10 MG tablet Take 1 tablet (10 mg total) by mouth every 6 (six) hours as needed for nausea (nausea/headache). 08/10/20  Yes Zsofia Prout, Barbara Cower, MD  metroNIDAZOLE (FLAGYL) 500 MG tablet Take 1 tablet  (500 mg total) by mouth 2 (two) times daily. 05/13/20   Particia Nearing, PA-C  oxyCODONE ER [facility] ER) 18 MG C12A Take by mouth.    [provider]  Oxycodone HCl 10 MG TABS Take 10 mg by mouth 3 (three) times daily. 04/21/20   [provider]  tamsulosin (FLOMAX) 0.4 MG CAPS capsule Take 1 capsule (0.4 mg total) by mouth daily for 15 days. 08/08/20 08/23/20  Mare Ferrari, PA-C  promethazine (PHENERGAN) 25 MG tablet Take 1 tablet (25 mg total) by mouth every 6 (six) hours as needed for nausea or vomiting. 11/27/18 12/27/19  Hermina Staggers, MD    Allergies    Naproxen and Toradol [ketorolac tromethamine]  Review of Systems   Review of Systems  Genitourinary: Positive for flank pain.  All other systems reviewed and are negative.   Physical Exam Updated Vital Signs BP 95/73 (BP Location: Right Arm)    Pulse 90    Temp 98.1 F (36.7 C) (Oral)    Resp 17    SpO2 100%   Physical Exam Vitals and nursing note reviewed.  Constitutional:      Appearance: She is well-developed and well-nourished.  HENT:     Head: Normocephalic and atraumatic.     Nose: No congestion or rhinorrhea.     Mouth/Throat:     Mouth: Mucous membranes are dry.     Pharynx: Oropharynx is clear.  Eyes:     Pupils: Pupils are equal, round, and reactive to light.  Cardiovascular:     Rate and Rhythm: Normal rate and regular rhythm.  Pulmonary:     Effort: No respiratory distress.     Breath sounds: No stridor.  Abdominal:     General: Abdomen is flat. There is no distension.  Musculoskeletal:        General: No swelling or tenderness. Normal range of motion.     Cervical back: Normal range of motion.  Skin:    General: Skin is warm and dry.     Coloration: Skin is not jaundiced or pale.  Neurological:     General: No focal deficit present.     Mental Status: She is alert and oriented to person, place, and time.     ED Results / Procedures / Treatments   Labs (all labs ordered  are listed, but only abnormal results are displayed) Labs Reviewed  URINALYSIS, ROUTINE W REFLEX MICROSCOPIC - Abnormal; Notable for the following components:      Result Value   APPearance HAZY (*)    Hgb urine dipstick MODERATE (*)    RBC / HPF >50 (*)    Bacteria, UA RARE (*)    All other components within normal limits  BASIC METABOLIC PANEL - Abnormal; Notable for the following components:   Potassium 3.3 (*)    Glucose, Bld 138 (*)    Calcium 8.8 (*)    All other components within normal limits  CBC  I-STAT BETA HCG BLOOD, ED ([facility], [facility], [facility] ONLY)    EKG None  Radiology No results found.  Procedures Procedures   Medications Ordered in ED Medications  sodium chloride 0.9 % bolus 1,000 mL (0 mLs Intravenous Stopped 08/10/20 0115)  HYDROmorphone (DILAUDID) injection 1 mg (1 mg Intravenous Given 08/10/20 0015)  ondansetron (ZOFRAN) injection 4 mg (4 mg Intravenous Given 08/10/20 0015)  HYDROmorphone (DILAUDID) injection 1 mg (1 mg Intravenous Given 08/10/20 0213)  metoCLOPramide (REGLAN) injection 10 mg (10 mg Intravenous Given 08/10/20 0213)  oxyCODONE-acetaminophen (PERCOCET/ROXICET) 5-325 MG per tablet 2 tablet (2 tablets Oral Given 08/10/20 0411)  tamsulosin (FLOMAX) capsule 0.4 mg (0.4 mg Oral Given 08/10/20 0411)  lidocaine (XYLOCAINE) 174 mg in sodium chloride 0.9 % 100 mL IVPB (0 mg/kg  116.6 kg Intravenous Stopped 08/10/20 0515)    ED Course  I have reviewed the triage vital signs and the nursing notes.  Pertinent labs & imaging results that were available during my care of the patient were reviewed by me and considered in my medical decision making (see chart for details).    MDM Rules/Calculators/A&P                         Appears uncomfortable at times and dry as well. Will give fliuds to attempt urination. Pain meds and nausea meds. Hopefully will be able to discharge.  Symptoms improved. Requesting discharge. No significnat change in labs to warrant emergent  urology call. She will d/w narcotic prescriber regarding increased Rx.   Final Clinical Impression(s) / ED Diagnoses Final diagnoses:  Kidney stone    Rx / DC Orders ED Discharge Orders         Ordered    metoCLOPramide (REGLAN) 10 MG tablet  Every 6 hours PRN        08/10/20 0523           Shaft Corigliano, Barbara Cower, MD 08/11/20 0020

## 2020-08-11 LAB — GC/CHLAMYDIA PROBE AMP (~~LOC~~) NOT AT ARMC
Chlamydia: NEGATIVE
Comment: NEGATIVE
Comment: NORMAL
Neisseria Gonorrhea: NEGATIVE

## 2020-08-17 ENCOUNTER — Other Ambulatory Visit: Payer: Self-pay

## 2020-08-17 ENCOUNTER — Emergency Department (HOSPITAL_COMMUNITY)
Admission: EM | Admit: 2020-08-17 | Discharge: 2020-08-17 | Disposition: A | Discharge status: Home / Self Care | Payer: Medicaid Other | Attending: Emergency Medicine | Admitting: Emergency Medicine

## 2020-08-17 ENCOUNTER — Emergency Department (HOSPITAL_COMMUNITY): Payer: Medicaid Other

## 2020-08-17 DIAGNOSIS — R102 Pelvic and perineal pain: Secondary | ICD-10-CM | POA: Insufficient documentation

## 2020-08-17 DIAGNOSIS — N760 Acute vaginitis: Secondary | ICD-10-CM | POA: Diagnosis not present

## 2020-08-17 DIAGNOSIS — N200 Calculus of kidney: Secondary | ICD-10-CM | POA: Insufficient documentation

## 2020-08-17 DIAGNOSIS — G8929 Other chronic pain: Secondary | ICD-10-CM

## 2020-08-17 DIAGNOSIS — R109 Unspecified abdominal pain: Secondary | ICD-10-CM | POA: Diagnosis present

## 2020-08-17 DIAGNOSIS — B9689 Other specified bacterial agents as the cause of diseases classified elsewhere: Secondary | ICD-10-CM | POA: Insufficient documentation

## 2020-08-17 DIAGNOSIS — Z79899 Other long term (current) drug therapy: Secondary | ICD-10-CM | POA: Diagnosis not present

## 2020-08-17 LAB — COMPREHENSIVE METABOLIC PANEL
ALT: 19 U/L (ref 0–44)
AST: 15 U/L (ref 15–41)
Albumin: 3.4 g/dL — ABNORMAL LOW (ref 3.5–5.0)
Alkaline Phosphatase: 60 U/L (ref 38–126)
Anion gap: 10 (ref 5–15)
BUN: 10 mg/dL (ref 6–20)
CO2: 19 mmol/L — ABNORMAL LOW (ref 22–32)
Calcium: 8.9 mg/dL (ref 8.9–10.3)
Chloride: 107 mmol/L (ref 98–111)
Creatinine, Ser: 0.74 mg/dL (ref 0.44–1.00)
GFR, Estimated: >60 mL/min (ref >60)
Glucose, Bld: 122 mg/dL — ABNORMAL HIGH (ref 70–99)
Potassium: 3.7 mmol/L (ref 3.5–5.1)
Sodium: 136 mmol/L (ref 135–145)
Total Bilirubin: 0.4 mg/dL (ref 0.3–1.2)
Total Protein: 7.1 g/dL (ref 6.5–8.1)

## 2020-08-17 LAB — CBC
HCT: 36.2 % (ref 36.0–46.0)
Hemoglobin: 11.4 g/dL — ABNORMAL LOW (ref 12.0–15.0)
MCH: 26.3 pg (ref 26.0–34.0)
MCHC: 31.5 g/dL (ref 30.0–36.0)
MCV: 83.6 fL (ref 80.0–100.0)
Platelets: 184 10*3/uL (ref 150–400)
RBC: 4.33 MIL/uL (ref 3.87–5.11)
RDW: 14.8 % (ref 11.5–15.5)
WBC: 8.5 10*3/uL (ref 4.0–10.5)
nRBC: 0 % (ref 0.0–0.2)

## 2020-08-17 LAB — URINALYSIS, ROUTINE W REFLEX MICROSCOPIC
Bilirubin Urine: NEGATIVE
Glucose, UA: NEGATIVE mg/dL
Hgb urine dipstick: NEGATIVE
Ketones, ur: NEGATIVE mg/dL
Leukocytes,Ua: NEGATIVE
Nitrite: NEGATIVE
Protein, ur: NEGATIVE mg/dL
Specific Gravity, Urine: 1.025 (ref 1.005–1.030)
pH: 6 (ref 5.0–8.0)

## 2020-08-17 LAB — I-STAT BETA HCG BLOOD, ED (MC, WL, AP ONLY): I-stat hCG, quantitative: <5 m[IU]/mL (ref <5)

## 2020-08-17 LAB — LIPASE, BLOOD: Lipase: 26 U/L (ref 11–51)

## 2020-08-17 MED ORDER — METRONIDAZOLE 0.75 % EX GEL: 1.0000 "application " | Freq: Every day | CUTANEOUS | 0 refills | Status: AC

## 2020-08-17 MED ORDER — HYDROMORPHONE HCL 1 MG/ML IJ SOLN
1.0000 mg | Freq: Once | INTRAMUSCULAR | Status: AC
  Administered 2020-08-17: 1 mg via INTRAVENOUS
  Filled 2020-08-17: qty 1

## 2020-08-17 MED ORDER — ONDANSETRON HCL 4 MG/2ML IJ SOLN
4.0000 mg | Freq: Once | INTRAMUSCULAR | Status: AC
  Administered 2020-08-17: 4 mg via INTRAVENOUS
  Filled 2020-08-17: qty 2

## 2020-08-17 MED ORDER — HYDROMORPHONE HCL 1 MG/ML IJ SOLN: 1.0000 mg | Freq: Once | INTRAMUSCULAR | Status: DC

## 2020-08-17 MED ORDER — ONDANSETRON 4 MG PO TBDP: 4.0000 mg | ORAL_TABLET | Freq: Three times a day (TID) | ORAL | 0 refills | Status: DC | PRN

## 2020-08-17 MED ORDER — SODIUM CHLORIDE 0.9 % IV BOLUS
1000.0000 mL | Freq: Once | INTRAVENOUS | Status: AC
  Administered 2020-08-17: 1000 mL via INTRAVENOUS

## 2020-08-17 MED ORDER — ONDANSETRON 4 MG PO TBDP: 4.0000 mg | ORAL_TABLET | Freq: Once | ORAL | Status: DC

## 2020-08-17 NOTE — Discharge Instructions (Addendum)
Continue taking your home medications  I have written for the vaginal gel for the BV  I have also written for your Zofran the nausea medication  Keep your appointment next week for the Urologist  Return for new or worsening symptoms

## 2020-08-17 NOTE — ED Triage Notes (Signed)
Pt presents to ED POv. Pt c/o auprapubic pain and bilateral flank pain. Pt seen here several time recently. Reports that her pain meds at home are not helping. Pt reports she has not passed kidney stone she was diagnosed with.

## 2020-08-17 NOTE — ED Notes (Signed)
IV team at bedside 

## 2020-08-17 NOTE — ED Provider Notes (Signed)
[facility] EMERGENCY DEPARTMENT Provider Note   CSN: [id number] Arrival date & time: 08/17/20  0444     History Chief Complaint  Patient presents with  . Abdominal Pain    Chloe Rodgers is a 30 y.o. female with past medical history significant for chronic pelvic pain, chronic PID, interstitial cystitis, and ADD who presents for evaluation of flank and abdominal pain. Patient was seen here on 1/28 as well as 1/29. Was diagnosed with right ureteral stone. Patient states she is still having pain. States yesterday she thinks she had a fever yesterday. No antipyretics at home. Afebrile here. Has had multiple episodes of NBNB emesis. Is been unable to keep down her home pain medication, Xtampxa 18 mg BID and Oxydodone 10 mg TID. Has significant pain with intercourse and has been unable to have any intercourse over the last few weeks. She is followed by [facility] for her chronic pain. No known Covid exposures. No headache, numbness, dizziness, chest pain, shortness of breath, hematuria, diarrhea or constipation. Does have chronic dysuria. Denies additional rating or alleviating factors. She rates her current pain a 10/10. Pelvic exam on 08/08/20 with GC, Wet Prep, personally reviewed. BV positive on wet prep, gonorrhea chlamydia negative, has not sexual intercourse since then  History obtained from patient and past medical records. No interpreter used  HPI     Past Medical History:  Diagnosis Date  . Anemia   . Anxiety   . Chlamydia   . Complication of anesthesia    ??, seizure with wisdom teeth  . Depression    not on meds, sees a therapist  . Family history of breast cancer   . Family history of colon cancer   . Infection    UTI  . Kidney stone    kidney stones  . Migraine   . PID (acute pelvic inflammatory disease) 2014  . PONV (postoperative nausea and vomiting)   . Psoriasis   . Seizures [facility])    July 2013 - [place]    Patient Active  Problem List   Diagnosis Date Noted  . Genetic testing 03/07/2020  . Family history of breast cancer   . Family history of colon cancer   . Supervision of low-risk pregnancy 11/23/2018  . Chronic PID (chronic pelvic inflammatory disease) 12/13/2017  . Status post repeat low transverse cesarean section 03/30/2017  . Pelvic inflammatory disease (PID) 02/21/2013  . Migraines 07/24/2012  . Previous cesarean delivery, antepartum condition or complication 03/30/2012  . Obese 03/30/2012  . Seizure disorder (HCC) 02/03/2012  . Family history of breast cancer in mother 02/03/2012    Past Surgical History:  Procedure Laterality Date  . CESAREAN SECTION    . CESAREAN SECTION  06/06/2012   Procedure: CESAREAN SECTION;  Surgeon: Adam Phenix, MD;  Location: [facility];  Service: Obstetrics;  Laterality: N/A;  . CESAREAN SECTION N/A 03/30/2017   Procedure: REPEAT CESAREAN SECTION;  Surgeon: Lazaro Arms, MD;  Location: [facility] BIRTHING SUITES;  Service: Obstetrics;  Laterality: N/A;  . SKIN GRAFT     off abd, onto arm  . WISDOM TOOTH EXTRACTION       OB History    Gravida  6   Para  3   Term  3   Preterm  0   AB  2   Living  3     SAB  0   IAB  1   Ectopic  0   Multiple  0  Live Births  3           Family History  Problem Relation Age of Onset  . Hypertension Mother   . Diabetes Mother   . Stroke Mother   . Seizures Mother   . Breast cancer Mother        dx in her early 59s  . Asthma Daughter   . Hypertension Maternal Grandmother   . Diabetes Maternal Grandmother   . Cancer Maternal Grandmother        BONE CANCER  . Breast cancer Maternal Grandmother        dx in her early 70s  . Colon cancer Maternal Grandmother        dx in her 53s  . Dementia Paternal Grandmother   . Other Neg Hx     Social History   Tobacco Use  . Smoking status: Never Smoker  . Smokeless tobacco: Never Used  Vaping Use  . Vaping Use: Never used  Substance Use Topics  . Alcohol  use: Yes    Comment: occ  . Drug use: No    Home Medications Prior to Admission medications   Medication Sig Start Date End Date Taking? Authorizing Provider  citalopram (CELEXA) 40 MG tablet Take 40 mg by mouth at bedtime.   Yes [provider]  metoCLOPramide (REGLAN) 10 MG tablet Take 1 tablet (10 mg total) by mouth every 6 (six) hours as needed for nausea (nausea/headache). 08/10/20  Yes Mesner, Barbara Cower, MD  metroNIDAZOLE (METROGEL) 0.75 % gel Apply 1 application topically daily for 5 days. 08/17/20 08/22/20 Yes Michelangelo Rindfleisch A, PA-C  ondansetron (ZOFRAN ODT) 4 MG disintegrating tablet Take 1 tablet (4 mg total) by mouth every 8 (eight) hours as needed for nausea or vomiting. 08/17/20  Yes Mieshia Pepitone A, PA-C  oxcarbazepine (TRILEPTAL) 600 MG tablet Take 600 mg by mouth See admin instructions. Take 1/2 tablet in the morning and 1 tablet every evening for mood.   Yes [provider]  oxyCODONE ER (XTAMPZA ER) 18 MG C12A Take 18 mg by mouth in the morning and at bedtime.   Yes [provider]  Oxycodone HCl 10 MG TABS Take 10 mg by mouth 3 (three) times daily. 04/21/20  Yes [provider]  tamsulosin (FLOMAX) 0.4 MG CAPS capsule Take 1 capsule (0.4 mg total) by mouth daily for 15 days. 08/08/20 08/23/20 Yes Mare Ferrari, PA-C  metroNIDAZOLE (FLAGYL) 500 MG tablet Take 1 tablet (500 mg total) by mouth 2 (two) times daily. Patient not taking: Reported on 08/17/2020 05/13/20   Particia Nearing, PA-C  promethazine (PHENERGAN) 25 MG tablet Take 1 tablet (25 mg total) by mouth every 6 (six) hours as needed for nausea or vomiting. 11/27/18 12/27/19  Hermina Staggers, MD    Allergies    Naproxen and Toradol [ketorolac tromethamine]  Review of Systems   Review of Systems  Constitutional: Positive for fever (Subjective). Negative for activity change, appetite change, chills, diaphoresis, fatigue and unexpected weight change.  HENT: Negative.   Respiratory:  Negative.   Cardiovascular: Negative.   Gastrointestinal: Positive for abdominal pain, nausea and vomiting. Negative for abdominal distention, anal bleeding, blood in stool, constipation, diarrhea and rectal pain.  Genitourinary: Negative.   Musculoskeletal: Negative.   Skin: Negative.   Neurological: Negative.   All other systems reviewed and are negative.   Physical Exam Updated Vital Signs BP (!) 108/92   Pulse 86   Temp 98.4 F (36.9 C) (Oral)  Resp 16   SpO2 100%   Physical Exam Vitals and nursing note reviewed.  Constitutional:      General: She is not in acute distress.    Appearance: She is well-developed and well-nourished. She is not ill-appearing, toxic-appearing or diaphoretic.  HENT:     Head: Normocephalic and atraumatic.     Mouth/Throat:     Mouth: Mucous membranes are moist.  Eyes:     Pupils: Pupils are equal, round, and reactive to light.  Cardiovascular:     Rate and Rhythm: Normal rate.     Pulses: Intact distal pulses.     Heart sounds: Normal heart sounds.  Pulmonary:     Effort: Pulmonary effort is normal. No respiratory distress.     Breath sounds: Normal breath sounds.  Abdominal:     General: There is no distension.     Palpations: Abdomen is soft.     Tenderness: There is generalized abdominal tenderness. There is right CVA tenderness and left CVA tenderness. There is no guarding or rebound. Negative signs include Murphy's sign and McBurney's sign.     Hernia: No hernia is present.  Genitourinary:    Comments: Declined Musculoskeletal:        General: Normal range of motion.     Cervical back: Normal range of motion.  Skin:    General: Skin is warm and dry.  Neurological:     Mental Status: She is alert.  Psychiatric:        Mood and Affect: Mood and affect normal.     ED Results / Procedures / Treatments   Labs (all labs ordered are listed, but only abnormal results are displayed) Labs Reviewed  COMPREHENSIVE METABOLIC PANEL -  Abnormal; Notable for the following components:      Result Value   CO2 19 (*)    Glucose, Bld 122 (*)    Albumin 3.4 (*)    All other components within normal limits  CBC - Abnormal; Notable for the following components:   Hemoglobin 11.4 (*)    All other components within normal limits  URINALYSIS, ROUTINE W REFLEX MICROSCOPIC - Abnormal; Notable for the following components:   APPearance CLOUDY (*)    All other components within normal limits  LIPASE, BLOOD  I-STAT BETA HCG BLOOD, ED ([facility], [facility], [facility] ONLY)    EKG None  Radiology CT Renal Stone Study  Result Date: 08/17/2020 CLINICAL DATA:  Flank pain EXAM: CT ABDOMEN AND PELVIS WITHOUT CONTRAST TECHNIQUE: Multidetector CT imaging of the abdomen and pelvis was performed following the standard protocol without IV contrast. COMPARISON:  July 31, 2020 FINDINGS: Lower chest: No acute abnormality. Hepatobiliary: Unremarkable noncontrast appearance of the liver and gallbladder. Pancreas: No peripancreatic fat stranding. Spleen: Unremarkable. Adrenals/Urinary Tract: Previously described 3 mm RIGHT-sided ureterolithiasis has progressed to the level of the distal ureter and is at the UVJ. Trace RIGHT hydronephrosis, decreased in comparison to prior. No LEFT-sided hydronephrosis. Punctate nonobstructive nephrolithiasis within the of the superior pole the RIGHT kidney. Adrenals are unremarkable. Bladder is unremarkable. Stomach/Bowel: Tiny hiatal hernia. Appendix is normal. No evidence of bowel obstruction. No focal or diffuse bowel wall thickening. Vascular/Lymphatic: No significant vascular findings are present. No enlarged abdominal or pelvic lymph nodes. Reproductive: Uterus and bilateral adnexa are unremarkable. Other: No free air or free fluid. Musculoskeletal: No acute or significant osseous findings. IMPRESSION: 1. Previously described 3 mm RIGHT-sided ureterolithiasis has progressed to the level of the distal ureter and is at the UVJ. Trace RIGHT  hydronephrosis, decreased in comparison to prior. Electronically Signed   By: Meda Klinefelter MD   On: 08/17/2020 12:02    Procedures Procedures   Medications Ordered in ED Medications  ondansetron [facility]) injection 4 mg (4 mg Intravenous Given 08/17/20 1337)  sodium chloride 0.9 % bolus 1,000 mL (1,000 mLs Intravenous New Bag/Given 08/17/20 1338)  HYDROmorphone (DILAUDID) injection 1 mg (1 mg Intravenous Given 08/17/20 1338)   ED Course  I have reviewed the triage vital signs and the nursing notes.  Pertinent labs & imaging results that were available during my care of the patient were reviewed by me and considered in my medical decision making (see chart for details).  30 year old, known history of chronic pelvic pain, followed by pain management at [facility] who presents for evaluation of worsening pain similar to when she was diagnosed last week with a kidney stone.  She does not feel like she has passed this.  Has had episodes of NBNB emesis at home. Still has persistent vaginal dc similar to prior episode of BV, diagnosed last week. Was not given Rx for Flagyl at last visit. Requesting meds for her BV today. Does not want pelvic exam. GC, Chlamydia last week negative, has not been sexually active in last 3 weeks.  Her heart and lungs clear.  Her abdomen is diffusely tender.  She denies any dysuria or hematuria.  Plan on labs, check and reassess she appears overall well.  Labs and imaging personally reviewed and interpreted:  CBC without leukocytosis Metabolic panel with CO2 nineteen, glucose one twenty-two, no additional electrolyte, renal or normality UA negative for infection pregnancy test negative Lipase twenty-six CT Stone study with 3 mm stone at UVJ.  Improved hydronephrosis  Patient reassessed. Difficulty obtaining IV access. IV consulted.  Patient reassessed. Pain controlled. Tolerating PO intake. She has appointment this week for Urology. Discussed taking her  home pain meds and I dc her home with antemetic and medication for her BV.  Patient does not meet the SIRS or Sepsis criteria.  On repeat exam patient does not have a surgical abdomin and there are no peritoneal signs.  No indication of appendicitis, bowel obstruction, bowel perforation, cholecystitis, diverticulitis, acute PID, TOA, torsion or ectopic pregnancy.    Patient seen and evaluated by attending Dr. Stevie Kern who is in agreement with above treatment, plan and disposition.      MDM Rules/Calculators/A&P                           Final Clinical Impression(s) / ED Diagnoses Final diagnoses:  Chronic pelvic pain in female  Right nephrolithiasis  BV (bacterial vaginosis)    Rx / DC Orders ED Discharge Orders         Ordered    metroNIDAZOLE (METROGEL) 0.75 % gel  Daily        08/17/20 1416    ondansetron (ZOFRAN ODT) 4 MG disintegrating tablet  Every 8 hours PRN        08/17/20 1416           Jasun Gasparini A, PA-C 08/17/20 1421    Milagros Loll, MD 08/18/20 [phone]

## 2020-08-21 ENCOUNTER — Encounter: Payer: Self-pay | Admitting: Physical Therapy

## 2020-08-21 ENCOUNTER — Ambulatory Visit: Payer: Medicaid Other | Attending: Nurse Practitioner | Admitting: Physical Therapy

## 2020-08-21 ENCOUNTER — Other Ambulatory Visit: Payer: Self-pay

## 2020-08-21 DIAGNOSIS — R32 Unspecified urinary incontinence: Secondary | ICD-10-CM | POA: Diagnosis present

## 2020-08-21 DIAGNOSIS — M6281 Muscle weakness (generalized): Secondary | ICD-10-CM | POA: Insufficient documentation

## 2020-08-21 DIAGNOSIS — R252 Cramp and spasm: Secondary | ICD-10-CM | POA: Diagnosis present

## 2020-08-21 NOTE — Therapy (Signed)
[facility] [address] [place], [place], [address] Phone: [phone]   Fax:  [phone]  Physical Therapy Treatment  Patient Details  Name: Chloe Rodgers MRN: [id number] Date of Birth: 1990-08-27 Referring Provider (PT): Dr. Emelia Loron   Encounter Date: 08/21/2020   PT End of Session - 08/21/20 0852    Visit Number 2    Date for PT Re-Evaluation 10/16/20    Authorization Type Medicaid    Authorization Time Period 1/26-2/22    Authorization - Visit Number 1    Authorization - Number of Visits 2    PT Start Time 0845    PT Stop Time 0925    PT Time Calculation (min) 40 min    Activity Tolerance Patient tolerated treatment well    Behavior During Therapy [facility] for tasks assessed/performed           Past Medical History:  Diagnosis Date  . Anemia   . Anxiety   . Chlamydia   . Complication of anesthesia    ??, seizure with wisdom teeth  . Depression    not on meds, sees a therapist  . Family history of breast cancer   . Family history of colon cancer   . Infection    UTI  . Kidney stone    kidney stones  . Migraine   . PID (acute pelvic inflammatory disease) 2014  . PONV (postoperative nausea and vomiting)   . Psoriasis   . Seizures [facility])    July 2013 - [place]    Past Surgical History:  Procedure Laterality Date  . CESAREAN SECTION    . CESAREAN SECTION  06/06/2012   Procedure: CESAREAN SECTION;  Surgeon: Woodroe Mode, MD;  Location: [facility];  Service: Obstetrics;  Laterality: N/A;  . CESAREAN SECTION N/A 03/30/2017   Procedure: REPEAT CESAREAN SECTION;  Surgeon: Florian Buff, MD;  Location: [employer];  Service: Obstetrics;  Laterality: N/A;  . SKIN GRAFT     off abd, onto arm  . WISDOM TOOTH EXTRACTION      There were no vitals filed for this visit.   Subjective Assessment - 08/21/20 0849    Subjective I have been in the hospital due to kidney stones in both sides.  Patient reports she has to wait till they pass. MS wants her to avoid laying down too long. Pelvic floor pain hurts. I go to [facility].    Patient Stated Goals work on pain    Currently in Pain? Yes    Pain Score 8     Pain Location Other (Comment)   pelvis   Pain Orientation Anterior    Pain Descriptors / Indicators Sharp;Dull    Pain Type Acute pain    Pain Onset More than a month ago    Pain Frequency Constant    Aggravating Factors  lay down    Pain Relieving Factors medicine, move around    Multiple Pain Sites No              [facility] PT Assessment - 08/21/20 0001      Assessment   Medical Diagnosis R10.2 pelvic pain    Referring Provider (PT) Dr. Emelia Loron    Onset Date/Surgical Date --   chronic   Prior Therapy yes      Precautions   Precautions None      [place] residence      Prior Function  Level of Independence Independent      Cognition   Overall Cognitive Status Within Functional Limits for tasks assessed      Observation/Other Assessments   Focus on Therapeutic Outcomes (FOTO)  UIQ-7 38; CRAIQ-7 0; POPIQ-7 57; PFIQ-7 95      Posture/Postural Control   Posture/Postural Control Postural limitations    Posture Comments increased abdominal mass      PROM   Right Hip Flexion 105    Right Hip External Rotation  40    Left Hip Flexion 95    Left Hip External Rotation  50      Strength   Right Hip ABduction 4/5   painful   Right Hip ADduction 4/5    Left Hip ABduction 5/5    Left Hip ADduction 5/5                         [facility] Adult PT Treatment/Exercise - 08/21/20 0001      Self-Care   Self-Care Other Self-Care Comments    Other Self-Care Comments  educated patient on pelvic floor meditation to relax her nervous system      Manual Therapy   Manual Therapy Soft tissue mobilization    Manual therapy comments educated patient on how to perform her pelvic massage and gave a you tube video     Soft tissue mobilization using roller to massage the inner thighs, massaging the inner thigh going from the pubic bone to the ischial tuberosity; pulling up the buttock crack, pulling along the buttocks and fan out, pullup from the pubic bone tot he belly button                  PT Education - 08/21/20 0926    Education Details pelvic massage and pelvic meditation using you tube video    Person(s) Educated Patient    Methods Explanation;Demonstration    Comprehension Verbalized understanding;Returned demonstration            PT Short Term Goals - 08/21/20 0931      PT SHORT TERM GOAL #1   Title ind with initial HEP    Baseline just educated    Time 4    Period Weeks    Status On-going             PT Long Term Goals - 07/24/20 1142      PT LONG TERM GOAL #1   Title pt will be ind with advanced HEP for pain management    Baseline not educated yet    Time 12    Period Weeks    Status New    Target Date 10/16/20      PT LONG TERM GOAL #2   Title Pt will report  pain is </= 4/10 during daily activities so she can perform tasks such as sitting for 30 minutes    Baseline pain level is 9/10    Time 12    Period Weeks    Status New    Target Date 10/16/20      PT LONG TERM GOAL #3   Title Patient is able to stand for 15-20 minutes with weight shifting to wait on a line with pain level </= 4/10    Baseline pain level 9/10    Time 12    Period Weeks    Status New    Target Date 10/16/20      PT LONG TERM GOAL #4   Title  pt will be able to perfrom functional transfers without increased pain due to increased core strength.    Baseline pain level 9/10    Time 12    Period Weeks    Status New    Target Date 10/16/20      PT LONG TERM GOAL #5   Title ----    Baseline ---                 Plan - 08/21/20 7356    Clinical Impression Statement Patient has not been in therapy due to being sick and in the emergency department 3 times due to kidney  stones. Patient is still waiting for the kidney stones to pass and in increased pain. She has not met goals or progressed due to attending the initial eval and today was her second visit. Patient was educated on HEP to day for pain reduction and muscle spasm reduction. Patient has trigger points in the pelvic floor, hip adductors, lower abdomen and gluteal. Patient continues to have weakness in her hips and abdomen. Patient continues to get Bacterial Vaginosis and is seeing a urologist for this. Patient will benefit from skilled therapy to improve pelvic floor coordination and reduce pain.    Personal Factors and Comorbidities Comorbidity 2;Behavior Pattern;Fitness;Past/Current Experience;Time since onset of injury/illness/exacerbation    Comorbidities Interstitial Cystitis, recurring Bacterial Vaginosis, C-section 2x,    Examination-Activity Limitations Caring for Others;Continence;Toileting;Sit;Stand    Examination-Participation Restrictions Community Activity;Driving    Stability/Clinical Decision Making Evolving/Moderate complexity    Rehab Potential Excellent    PT Frequency 1x / week    PT Duration 12 weeks    PT Treatment/Interventions ADLs/Self Care Home Management;Biofeedback;Cryotherapy;Electrical Stimulation;Moist Heat;Neuromuscular re-education;Therapeutic exercise;Therapeutic activities;Patient/family education;Manual techniques    PT Next Visit Plan assess pelvic floor next visit; diaphragmatic breathing, fem fusion pelvic floor manual therapy    Consulted and Agree with Plan of Care Patient           Patient will benefit from skilled therapeutic intervention in order to improve the following deficits and impairments:  Decreased coordination,Increased fascial restricitons,Pain,Decreased strength,Decreased activity tolerance,Increased muscle spasms,Decreased endurance  Visit Diagnosis: Muscle weakness (generalized)  Cramp and spasm  Urinary incontinence, unspecified  type     Problem List Patient Active Problem List   Diagnosis Date Noted  . Genetic testing 03/07/2020  . Family history of breast cancer   . Family history of colon cancer   . Supervision of low-risk pregnancy 11/23/2018  . Chronic PID (chronic pelvic inflammatory disease) 12/13/2017  . Status post repeat low transverse cesarean section 03/30/2017  . Pelvic inflammatory disease (PID) 02/21/2013  . Migraines 07/24/2012  . Previous cesarean delivery, antepartum condition or complication 70/14/1030  . Obese 03/30/2012  . Seizure disorder ([place]) 02/03/2012  . Family history of breast cancer in mother 02/03/2012    Earlie Counts, PT 08/21/20 9:32 AM   [facility] [address], [place], [place], [address] Phone: [phone]   Fax:  [phone]  Name: Lyllie Cobbins MRN: [id number] Date of Birth: 15-Jun-1991

## 2020-08-21 NOTE — Patient Instructions (Addendum)
   Self treatment for pelvic pain  [facility]  10-Minute Breath Meditation for Pelvic Health and Healing  Laying on side pulling from pubic bone to the belly button 10x  [facility] [address] [place], [place] [address] Phone # [phone] Fax [phone]

## 2020-09-04 ENCOUNTER — Ambulatory Visit: Payer: Medicaid Other | Admitting: Physical Therapy

## 2020-09-08 ENCOUNTER — Encounter: Payer: Self-pay | Admitting: Genetic Counselor

## 2020-09-08 NOTE — Progress Notes (Signed)
UPDATE:  The MSH6 c.643G>A VUS was reclassified to "Benign" on 09/02/2020. The change in variant classification was made as a result of re-review of the evidence in light of new variant interpretation guidelines and/or new information.

## 2020-09-09 ENCOUNTER — Ambulatory Visit (INDEPENDENT_AMBULATORY_CARE_PROVIDER_SITE_OTHER): Payer: Medicaid Other | Admitting: Obstetrics and Gynecology

## 2020-09-09 ENCOUNTER — Other Ambulatory Visit: Payer: Self-pay

## 2020-09-09 ENCOUNTER — Encounter: Payer: Self-pay | Admitting: Obstetrics and Gynecology

## 2020-09-09 VITALS — BP 119/83 | HR 97 | Ht 66.0 in | Wt 257.9 lb

## 2020-09-09 DIAGNOSIS — B9689 Other specified bacterial agents as the cause of diseases classified elsewhere: Secondary | ICD-10-CM | POA: Diagnosis not present

## 2020-09-09 DIAGNOSIS — N760 Acute vaginitis: Secondary | ICD-10-CM | POA: Diagnosis not present

## 2020-09-09 DIAGNOSIS — Z1331 Encounter for screening for depression: Secondary | ICD-10-CM

## 2020-09-09 DIAGNOSIS — N200 Calculus of kidney: Secondary | ICD-10-CM | POA: Diagnosis not present

## 2020-09-09 MED ORDER — METRONIDAZOLE 0.75 % VA GEL: 1.0000 | Freq: Every day | VAGINAL | 5 refills | Status: DC

## 2020-09-09 NOTE — Patient Instructions (Signed)
Drink a half cup to a cup of Kefir every night before bed until I see you back in clinic  Take the vaginal medications as instructed

## 2020-09-09 NOTE — Progress Notes (Signed)
  Obstetrics and Gynecology Visit Return Patient Evaluation  Appointment Date: 09/09/2020  Primary Care Provider: Diamantina Providence  OBGYN Clinic: [facility]  Chief Complaint: recurrent BV  History of Present Illness:  Chloe Rodgers is a 30 y.o. P4 (LMP: mid February 2022) with the above CC. PMHx significant for chronic PID.  Patient states that the pills and vaginal meds work but then she ges BV s/s (smell, d/c, pain; no itching, bleeding) after a few days.  1/28 ED visit for pain (kidney stone dx) showed a wet prep with clue cells and WBC and negative GC/CT. Patient last treated with metrogel from the ED on 2/6  She states that she missed her urology appt and needs to reschedule it.   Review of Systems: as noted in the History of Present Illness.  Patient Active Problem List   Diagnosis Date Noted  . Genetic testing 03/07/2020  . Family history of breast cancer   . Family history of colon cancer   . Chronic PID (chronic pelvic inflammatory disease) 12/13/2017  . Migraines 07/24/2012  . Obese 03/30/2012  . Seizure disorder ([facility]) 02/03/2012  . Family history of breast cancer in mother 02/03/2012   Medications:  [name] had no medications administered during this visit. Current Outpatient Medications  Medication Sig Dispense Refill  . citalopram (CELEXA) 40 MG tablet Take 40 mg by mouth at bedtime.    . metoCLOPramide (REGLAN) 10 MG tablet Take 1 tablet (10 mg total) by mouth every 6 (six) hours as needed for nausea (nausea/headache). 30 tablet 0  . ondansetron (ZOFRAN ODT) 4 MG disintegrating tablet Take 1 tablet (4 mg total) by mouth every 8 (eight) hours as needed for nausea or vomiting. 20 tablet 0  . oxcarbazepine (TRILEPTAL) 600 MG tablet Take 600 mg by mouth See admin instructions. Take 1/2 tablet in the morning and 1 tablet every evening for mood.    Marland Kitchen oxyCODONE ER (XTAMPZA ER) 18 MG C12A Take 18 mg by mouth in the morning and  at bedtime.    . Oxycodone HCl 10 MG TABS Take 10 mg by mouth 3 (three) times daily.     No current facility-administered medications for this visit.    Allergies: is allergic to naproxen and toradol [ketorolac tromethamine].  Physical Exam:  BP 119/83   Pulse 97   Ht 5\' 6"  (1.676 m)   Wt 257 lb 14.4 oz (117 kg)   LMP 08/12/2020 (Approximate)   BMI 41.63 kg/m  Body mass index is 41.63 kg/m. General appearance: Well nourished, well developed female in no acute distress.  Abdomen: diffusely non tender to palpation, non distended, and no masses, hernias Neuro/Psych:  Normal mood and affect.     Assessment: pt stable  Plan:  1. Encounter for screening for depression - Ambulatory referral to Integrated Behavioral Health  2. BV (bacterial vaginosis) metrogel x 10 days and then 2x/week for several months  D/w her pap due mid summer 2022.    RTC: 56m  3m MD Attending [facility])
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
## 2020-09-18 ENCOUNTER — Telehealth: Payer: Self-pay | Admitting: Physical Therapy

## 2020-09-18 ENCOUNTER — Ambulatory Visit: Payer: Medicaid Other | Attending: Nurse Practitioner | Admitting: Physical Therapy

## 2020-09-18 DIAGNOSIS — R32 Unspecified urinary incontinence: Secondary | ICD-10-CM | POA: Insufficient documentation

## 2020-09-18 DIAGNOSIS — M6281 Muscle weakness (generalized): Secondary | ICD-10-CM | POA: Insufficient documentation

## 2020-09-18 DIAGNOSIS — R252 Cramp and spasm: Secondary | ICD-10-CM | POA: Insufficient documentation

## 2020-09-18 NOTE — Telephone Encounter (Signed)
Called patient. She reported she is in the hospital since yesterday and feels like she may have COVID. Made her aware of our attendance policy. If she no shows next visit she will be discharged.  Eulis Foster, PT @3 /04/2021@ 9:10 AM

## 2020-09-20 ENCOUNTER — Other Ambulatory Visit: Payer: Self-pay

## 2020-09-20 ENCOUNTER — Emergency Department (HOSPITAL_BASED_OUTPATIENT_CLINIC_OR_DEPARTMENT_OTHER): Payer: Medicaid Other

## 2020-09-20 ENCOUNTER — Emergency Department (HOSPITAL_BASED_OUTPATIENT_CLINIC_OR_DEPARTMENT_OTHER)
Admission: EM | Admit: 2020-09-20 | Discharge: 2020-09-20 | Disposition: A | Discharge status: Home / Self Care | Payer: Medicaid Other | Attending: Emergency Medicine | Admitting: Emergency Medicine

## 2020-09-20 ENCOUNTER — Encounter (HOSPITAL_BASED_OUTPATIENT_CLINIC_OR_DEPARTMENT_OTHER): Payer: Self-pay | Admitting: Obstetrics and Gynecology

## 2020-09-20 ENCOUNTER — Ambulatory Visit (HOSPITAL_COMMUNITY)
Admission: EM | Admit: 2020-09-20 | Discharge: 2020-09-20 | Disposition: A | Discharge status: Home / Self Care | Payer: Medicaid Other

## 2020-09-20 DIAGNOSIS — S61215A Laceration without foreign body of left ring finger without damage to nail, initial encounter: Secondary | ICD-10-CM | POA: Insufficient documentation

## 2020-09-20 DIAGNOSIS — Z23 Encounter for immunization: Secondary | ICD-10-CM | POA: Diagnosis not present

## 2020-09-20 DIAGNOSIS — Z79899 Other long term (current) drug therapy: Secondary | ICD-10-CM | POA: Diagnosis not present

## 2020-09-20 DIAGNOSIS — S6992XA Unspecified injury of left wrist, hand and finger(s), initial encounter: Secondary | ICD-10-CM | POA: Diagnosis present

## 2020-09-20 DIAGNOSIS — W260XXA Contact with knife, initial encounter: Secondary | ICD-10-CM | POA: Diagnosis not present

## 2020-09-20 MED ORDER — CEPHALEXIN 500 MG PO CAPS: 500.0000 mg | ORAL_CAPSULE | Freq: Four times a day (QID) | ORAL | 0 refills | Status: DC

## 2020-09-20 MED ORDER — CEPHALEXIN 250 MG PO CAPS
500.0000 mg | ORAL_CAPSULE | Freq: Once | ORAL | Status: AC
  Administered 2020-09-20: 500 mg via ORAL
  Filled 2020-09-20: qty 2

## 2020-09-20 MED ORDER — HYDROCODONE-ACETAMINOPHEN 5-325 MG PO TABS
1.0000 | ORAL_TABLET | Freq: Once | ORAL | Status: AC
  Administered 2020-09-20: 1 via ORAL
  Filled 2020-09-20: qty 1

## 2020-09-20 MED ORDER — BACITRACIN ZINC 500 UNIT/GM EX OINT
1.0000 "application " | TOPICAL_OINTMENT | Freq: Once | CUTANEOUS | Status: AC
  Administered 2020-09-20: 1 via TOPICAL
  Filled 2020-09-20: qty 28.35

## 2020-09-20 MED ORDER — BUPIVACAINE HCL (PF) 0.5 % IJ SOLN
10.0000 mL | Freq: Once | INTRAMUSCULAR | Status: AC
  Administered 2020-09-20: 10 mL
  Filled 2020-09-20: qty 10

## 2020-09-20 MED ORDER — TETANUS-DIPHTH-ACELL PERTUSSIS 5-2.5-18.5 LF-MCG/0.5 IM SUSY
0.5000 mL | PREFILLED_SYRINGE | Freq: Once | INTRAMUSCULAR | Status: AC
  Administered 2020-09-20: 0.5 mL via INTRAMUSCULAR
  Filled 2020-09-20: qty 0.5

## 2020-09-20 NOTE — ED Notes (Signed)
Patient is being discharged from the Urgent Care and sent to the Emergency Department via pov . Per Jodell Cipro, PA, patient is in need of higher level of care due to finger laceration > 12 hours, finger numbness. Patient is aware and verbalizes understanding of plan of care. There were no vitals filed for this visit.
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
## 2020-09-26 ENCOUNTER — Ambulatory Visit (INDEPENDENT_AMBULATORY_CARE_PROVIDER_SITE_OTHER): Payer: Medicaid Other | Admitting: Clinical

## 2020-09-26 DIAGNOSIS — F4321 Adjustment disorder with depressed mood: Secondary | ICD-10-CM

## 2020-09-26 DIAGNOSIS — F4310 Post-traumatic stress disorder, unspecified: Secondary | ICD-10-CM

## 2020-09-26 DIAGNOSIS — Z658 Other specified problems related to psychosocial circumstances: Secondary | ICD-10-CM

## 2020-09-26 NOTE — Patient Instructions (Addendum)
Center for [facility] [address] [place], [place] [address] [phone] (main office) [phone] (Jamie's office)   Hello Simona HuhShakilla, These are resources that have been helpful to women and families experiencing grief:   [facility] (Individual and group grief support; for kid's grief support, ask about Kids Path)   [facility].org  [phone]  [facility] [place] Autism Resources  [employer] [employer]: Autism Resources http://www1.SouvenirBaseball.esgcsnc.com/depts/EC/parents.htm  Partnership for Children [facility] https://www.guilfordchildren.org/families/developmental-needs/  TEACCH Centers PreviewDomains.sehttps://teacch.com/regional-centers/[place]-teacch-center/  Triad Moms On Main https://triadmomsonmain.com/my-blog/special-needs-resources/  Autism Society https://www.autismsociety-[place].org/find-help/  Behavioral Health Resources:   What if I or someone I know is in crisis?  . If you are thinking about harming yourself or having thoughts of suicide, or if you know someone who is, seek help right away.  . Call your doctor or mental health care provider.  . Call 911 or go to a hospital emergency room to get immediate help, or ask a friend or family member to help you do these things; IF YOU ARE IN [place], YOU MAY GO TO WALK-IN URGENT CARE 24/7 at [facility] (see below)  . Call the [place] National Suicide Prevention Lifeline's toll-free, 24-hour hotline at 1-800-273-TALK (737)028-0510(1-838-747-7857) or TTY: 1-800-799-4 TTY 713-230-9177(1-820-240-7006) to talk to a trained counselor.  . If you are in crisis, make sure you are not left alone.   . If someone else is in crisis, make sure he or she is not left alone   24 Hour :   [facility]  [address], [place], [place] [address] [phone] or [phone] WALK-IN URGENT CARE 24/7  [facility] Mobile Crisis: ([phone]  [place] National Suicide  Hotline: 276-476-28381-838-747-7857  [facility] the [facility] Crisis Line (Domestic Violence, Rape & Victim Assistance)  (223)281-9131(231)252-8328  [facility]  [address]. [place], [place]  [address]   [phone] or [phone]   [facility]: [phone] (8am-4pm) or ([phone]- [phone] (after hours)        [facility] 24/7 Walk-in Clinic, [address], [place], [place]  [phone] Fax: [phone] [email or web address] *Interpreters available *Accepts all insurance and uninsured for Urgent Care needs *Accepts Medicaid and uninsured for outpatient treatment   [facility]   Mon-Fri: 8am-5pm [address], [place], [place] [phone](phone); [phone](fax) https://www.[email or web address]  *Accepts Medicare  [facility]Mon, Tues, Thurs, Fri: 8am-4pm [address], [place], [place] [address] [phone] (phone); [phone] (fax) ExShows.dkwww.[email or web address]  *Accepts Medicare  [facility] Mon-Fri: 9am-5pm  [address], [place], [place] [phone] (phone); [phone]  MommyCollege.dkwww.[email or web address]  *Accepts Medicaid  [facility] [address] [place], [place], [place]  [phone] https://www.[email or web address]://[email or web address]   [facility] Mon-Fri, 8:30am-12pm/1pm-2:30pm [address], [place], [place] [phone] (phone); [phone] (fax) www.fspcares.org  *Accepts Medicaid, sliding-scale*Bilingual services available  [facility] Mon-Fri, 8am-7pm [address], [place], [place]  [phone](phone); [phone](fax) www.famsolutions.org  *Accepts Medicaid *Bilingual services available  [facility] Mon-Fri: 8am-5pm, Saturday by appointment only [address], [place], [place] [phone] (phone); [phone]  (fax) www.[email or web address]   [facility] [address], [address], [place], [place] [phone] www.[email or web address]  *Free & reduced services for uninsured and underinsured individuals *Bilingual services for Spanish-speaking clients 21 and under  [facility] 24/7 Walk-in Clinic, [address], [place], [place] [phone](phone); [phone](fax) KittenExchange.atwww.[email or web address]  *Bring your own interpreter at first visit *Accepts Medicare and Medicaid  [facility] Mon-Fri: 9am-5:30pm [address], [place], [place] [phone] (phone), [phone] (fax) After hours crisis line: [phone] www.neuropsychcarecenter.com  *Accepts Medicare and Medicaid  [employer], 8am-6pm [address] [place], [place]  [phone] (phone); [phone] (fax) http://presbyteriancounseling.org  *Subsidized costs available  [facility] Mon-Fri: 8am-4pm [address], [place], [place] [phone](GYBWL); [phone](fax) www.psychotherapeuticservices.com  *Accepts Medicaid  [facility] [place] Same day access hours: Mon-Fri, 8:30-3pm Crisis hours: Mon-Fri, 8am-5pm [address], [place], [place] [phone]  [facility] [employer] Same day access hours: Mon-Fri, 8:30-3pm Crisis hours: Mon-Fri, 8am-8pm [address], [place], [place] [phone] (phone); [phone] (fax) www.rhahealthservices.org  *Accepts Medicaid and Medicare  [facility] [place], [place], Fri: 9am-9pm Tues, Thurs: 9am-6pm [address] [place], [place], [place]  [phone] (phone); [phone] (fax) https://[email or web address]  *(Accepts Medicare and Medicaid; payment plans available)*Bilingual services available  [facility] [address] [place], [place] [phone] (phone); [phone] (fax) www.santecounseling.com   [facility]seling [address], [place], [place]  [phone]  RackRewards.fr  *Bilingual services available  [facility]otional [facility]) Mon-Thurs: 8am-8pm [address], [place], [place] [phone] (phone); [phone] (fax) ScrapbookLive.si  *Accepts Medicaid*Bilingual services available  [facility] [address] [place], [place] [phone] (phone) BrotherBig.at  *Accepts Medicaid *Bilingual services available  [facility] Mon-Fri, 9am-4:45pm [address], [place], [place] [phone] (phone); [phone] (fax) http://tlc-counseling.com  *Accepts Medicare  [facility] Mon-Thurs: 8:30-8pm, Fri: 8:30am-7pm [address], [place], [place] (3rd floor) [phone] (phone); [phone] (fax) https://www.warren.info/  *Accepts Medicaid; income-based reduced rates available  [facility] Mon-Fri: 8am-5pm [address], [place], [place] [address] [phone] (phone); [phone] (fax) http://www.[email or web address]  *Accepts Medicaid*Bilingual services available   [facility] [place])  [address] [place] [phone] www.mhag.org  *Provides direct services to individuals in recovery from mental illness, including support groups, recovery skills classes, and one on one peer support  [employer] on Mental Illness) [place] helpline: 281-224-9273  NAMI [place] helpline: (920)110-3386 https://namiguilford.org  *A community hub for information relating to local resources and services for the friends and families of individuals living alongside a mental health condition, as well as the individuals themselves. Classes and support groups also provided   Coping with Panic Attacks   What is a panic attack?  You may have had a panic attack if  you experienced four or more of the symptoms listed below coming on abruptly and peaking in about 10 minutes.  Panic Symptoms   . Pounding heart  . Sweating  . Trembling or shaking  . Shortness of breath  . Feeling of choking  . Chest pain  . Nausea or abdominal distress    . Feeling dizzy, unsteady, lightheaded, or faint  . Feelings of unreality or being detached from yourself  . Fear of losing control or going crazy  . Fear of dying  . Numbness or tingling  . Chills or hot flashes      Panic attacks are sometimes accompanied by avoidance of certain places or situations. These are often situations that would be difficult to escape from or in which help might not be available. Examples might include crowded shopping malls, public transportation, restaurants, or driving.   Why do panic attacks occur?   Panic attacks are the body's alarm system gone awry. All of [place] have a built-in alarm system, powered by adrenaline, which increases our heart rate, breathing, and blood flow in  response to danger. Ordinarily, this 'danger response system' works well. In some people, however, the response is either out of proportion to whatever stress is going on, or may come out of the blue without any stress at all.   For example, if you are walking in the woods and see a bear coming your way, a variety of changes occur in your body to prepare you to either fight the danger or flee from the situation. Your heart rate will increase to get more blood flow around your body, your breathing rate will quicken so that more oxygen is available, and your muscles will tighten in order to be ready to fight or run. You may feel nauseated as blood flow leaves your stomach area and moves into your limbs. These bodily changes are all essential to helping you survive the dangerous situation. After the danger has passed, your body functions will begin to go back to normal. This is because your body also has a system for  "recovering" by bringing your body back down to a normal state when the danger is over.   As you can see, the emergency response system is adaptive when there is, in fact, a "true" or "real" danger (e.g., bear). However, sometimes people find that their emergency response system is triggered in "everyday" situations where there really is no true physical danger (e.g., in a meeting, in the grocery store, while driving in normal traffic, etc.).   What triggers a panic attack?  Sometimes particularly stressful situations can trigger a panic attack. For example, an argument with your spouse or stressors at work can cause a stress response (activating the emergency response system) because you perceive it as threatening or overwhelming, even if there is no direct risk to your survival.  Sometimes panic attacks don't seem to be triggered by anything in particular- they may "come out of the blue". Somehow, the natural "fight or flight" emergency response system has gotten activated when there is no real danger. Why does the body go into "emergency mode" when there is no real danger?   Often, people with panic attacks are frightened or alarmed by the physical sensations of the emergency response system. First, unexpected physical sensations are experienced (tightness in your chest or some shortness of breath). This then leads to feeling fearful or alarmed by these symptoms ("Something's wrong!", "Am I having a heart attack?", "Am I going to faint?") The mind perceives that there is a danger even though no real danger exists. This, in turn, activates the emergency response system ("fight or flight"), leading to a "full blown" panic attack. In summary, panic attacks occur when we misinterpret physical symptoms as signs of impending death, craziness, loss of control, embarrassment, or fear of fear. Sometimes you may be aware of thoughts of danger that activate the emergency response system (for example, thinking "I'm  having a heart attack" when you feel chest pressure or increased heart rate). At other times, however, you may not be aware of such thoughts. After several incidences of being afraid of physical sensations, anxiety and panic can occur in response to the initial sensations without conscious thoughts of danger. Instead, you just feel afraid or alarmed. In other words, the panic or fear may seem to occur "automatically" without you consciously telling yourself anything.   After having had one or more panic attacks, you may also become more focused on what is going on inside your body. You may scan your body and be more vigilant about noticing  any symptoms that might signal the start of a panic attack. This makes it easier for panic attacks to happen again because you pick up on sensations you might otherwise not have noticed, and misinterpret them as something dangerous. A panic attack may then result.      How do I cope with panic attacks?  An important part of overcoming panic attacks involves re-interpreting your body's physical reactions and teaching yourself ways to decrease the physical arousal. This can be done through practicing the cognitive and behavioral interventions below.   Research has found that over half of people who have panic attacks show some signs of hyperventilation or overbreathing. This can produce initial sensations that alarm you and lead to a panic attack. Overbreathing can also develop as part of the panic attack and make the symptoms worse. When people hyperventilate, certain blood vessels in the body become narrower. In particular, the brain may get slightly less oxygen. This can lead to the symptoms of dizziness, confusion, and lightheadedness that often occur during panic attacks. Other parts of the body may also get a bit less oxygen, which may lead to numbness or tingling in the hands or feet or the sensation of cold, clammy hands. It also may lead the heart to pump harder.  Although these symptoms may be frightening and feel unpleasant, it is important to remember that hyperventilating is not dangerous. However, you can help overcome the unpleasantness of overbreathing by practicing Breathing Retraining.   Practice this basic technique three times a day, every day:  . Inhale. With your shoulders relaxed, inhale as slowly and deeply as you can while you count to six. If you can, use your diaphragm to fill your lungs with air.  . Hold. Keep the air in your lungs as you slowly count to four.  . Exhale. Slowly breath out as you count to six.  . Repeat. Do the inhale-hold-exhale cycle several times. Each time you do it, exhale for longer counts.  Like any new skill, Breathing Retraining requires practice. Try practicing this skill twice a day for several minutes. Initially, do not try this technique in specific situations or when you become frightened or have a panic attack. Begin by practicing in a quiet environment to build up your skill level so that you can later use it in time of "emergency."   2. Decreasing Avoidance  Regardless of whether you can identify why you began having panic attacks or whether they seemed to come out of the blue, the places where you began having panic attacks often can become triggers themselves. It is not uncommon for individuals to begin to avoid the places where they have had panic attacks. Over time, the individual may begin to avoid more and more places, thereby decreasing their activities and often negatively impacting their quality of life. To break the cycle of avoidance, it is important to first identify the places or situations that are being avoided, and then to do some "relearning."  To begin this intervention, first create a list of locations or situations that you tend to avoid. Then choose an avoided location or situation that you would like to target first. Now develop an "exposure hierarchy" for this situation or location. An  "exposure hierarchy" is a list of actions that make you feel anxious in this situation. Order these actions from least to most anxiety-producing. It is often helpful to have the first item on your hierarchy involve thinking or imagining part of the feared/avoided situation.  Here is an example of an exposure hierarchy for decreasing avoidance of the grocery store. Note how it is ordered from the least amount of anxiety (at the top) to the most anxiety (at the bottom):  Marland Kitchen Think about going to the grocery store alone.  . Go to the grocery store with a friend or family member.  . Go to the grocery store alone to pick up a few small items (5-10 minutes in the store).  . Shopping for 10-20 minutes in the store alone.  . Doing the shopping for the week by myself (20-30 minutes in the store).   Your homework is to "expose" yourself to the lowest item on your hierarchy and use your breathing relaxation and coping statements (see below) to help you remain in the situation. Practice this several times during the upcoming week. Once you have mastered each item with minimal anxiety, move on to the next higher action on your list.   Cognitive Interventions  1. Identify your negative self-talk Anxious thoughts can increase anxiety symptoms and panic. The first step in changing anxious thinking is to identify your own negative, alarming self-talk. Some common alarming thoughts:  . I'm having a heart attack.            . I must be going crazy. . I think I'm dying. Marland Kitchen People will think I'm crazy. . I'm going to pass our.  . Oh no- here it comes.  . I can't stand this.  Peggye Form got to get out of here!  2. Use positive coping statements Changing or disrupting a pattern of anxious thoughts by replacing them with more calming or supportive statements can help to divert a panic attack. Some common helpful coping statements:  . This is not an emergency.  . I don't like feeling this way, but I can accept it.  . I  can feel like this and still be okay.  . This has happened before, and I was okay. I'll be okay this time, too.  . I can be anxious and still deal with this situation.

## 2020-09-29 ENCOUNTER — Other Ambulatory Visit: Payer: Self-pay

## 2020-09-29 ENCOUNTER — Emergency Department (HOSPITAL_BASED_OUTPATIENT_CLINIC_OR_DEPARTMENT_OTHER)
Admission: EM | Admit: 2020-09-29 | Discharge: 2020-09-29 | Disposition: A | Discharge status: Home / Self Care | Payer: Medicaid Other | Attending: Emergency Medicine | Admitting: Emergency Medicine

## 2020-09-29 ENCOUNTER — Encounter (HOSPITAL_BASED_OUTPATIENT_CLINIC_OR_DEPARTMENT_OTHER): Payer: Self-pay | Admitting: Emergency Medicine

## 2020-09-29 DIAGNOSIS — L7682 Other postprocedural complications of skin and subcutaneous tissue: Secondary | ICD-10-CM | POA: Insufficient documentation

## 2020-09-29 DIAGNOSIS — M7989 Other specified soft tissue disorders: Secondary | ICD-10-CM | POA: Diagnosis present

## 2020-09-29 DIAGNOSIS — L089 Local infection of the skin and subcutaneous tissue, unspecified: Secondary | ICD-10-CM | POA: Diagnosis not present

## 2020-09-29 MED ORDER — CLINDAMYCIN HCL 150 MG PO CAPS
300.0000 mg | ORAL_CAPSULE | Freq: Once | ORAL | Status: AC
  Administered 2020-09-29: 300 mg via ORAL
  Filled 2020-09-29: qty 2

## 2020-09-29 MED ORDER — CLINDAMYCIN HCL 300 MG PO CAPS: 300.0000 mg | ORAL_CAPSULE | Freq: Three times a day (TID) | ORAL | 0 refills | Status: DC

## 2020-09-29 MED ORDER — CLINDAMYCIN PHOSPHATE 600 MG/50ML IV SOLN
600.0000 mg | Freq: Once | INTRAVENOUS | Status: DC
  Filled 2020-09-29: qty 50

## 2020-09-29 NOTE — ED Notes (Addendum)
Left ring finger is red and slightly swollen with intact stitches.

## 2020-09-29 NOTE — BH Specialist Note (Signed)
Integrated Behavioral Health via Telemedicine Visit  09/29/2020 Chloe Rodgers [id number]  Number of Integrated Behavioral Health visits: 2 Session Start time: 8:25  Session End time: 8:43 Total time: 18  Referring Provider: [place] Bing, MD Patient/Family location: [facility] Provider location: Center for [facility]  All persons participating in visit: Patient Chloe Rodgers and Chloe Rodgers Chloe Rodgers   Types of Service: Individual psychotherapy and Telephone visit  I connected with Chloe Rodgers and/or Chloe Rodgers n/a via  Telephone or Video Enabled Telemedicine Application  (Video is Caregility application) and verified that I am speaking with the correct person using two identifiers. Discussed confidentiality: Yes   I discussed the limitations of telemedicine and the availability of in person appointments.  Discussed there is a possibility of technology failure and discussed alternative modes of communication if that failure occurs.  I discussed that engaging in this telemedicine visit, they consent to the provision of behavioral healthcare and the services will be billed under their insurance.  Patient and/or legal guardian expressed understanding and consented to Telemedicine visit: Yes   Presenting Concerns: Patient and/or family reports the following symptoms/concerns: Pt states her primary concern today is migraine with accompanying nausea; waiting for PCP appointment availability (for referral to neurology), going to pain management for chronic pelvic pain; pt taking Celexa but not daily; pt uncertain about taking Celexa and Oxycodone, as she worries they may be making migraines worst; began attending [facility]. Duration of problem: Ongoing; Severity of problem: severe  Patient and/or Family's Strengths/Protective Factors: Concrete supports in place (healthy food, safe environments, etc.), Sense of purpose and Physical  Health (exercise, healthy diet, medication compliance, etc.)  Goals Addressed: Patient will: 1.  Reduce symptoms of: anxiety, depression and stress  2.  Demonstrate ability to: Increase healthy adjustment to current life circumstances  Progress towards Goals: Ongoing  Interventions: Interventions utilized:  Supportive Counseling Standardized Assessments completed: Not Needed  Patient and/or Family Response: Pt agrees with treatment plan  Assessment: Patient currently experiencing PTSD, Grief, Psychosocial stress.   Patient may benefit from supportive counseling today.  Plan: 1. Follow up with behavioral health clinician on : Call Asher Muir as needed at [phone] 2. Behavioral recommendations:  -Continue plan to attend [facility] -Take BH medication as prescribed; discuss with psychiatrist any changes to Celexa -Continue plan to set up PCP appointment; discuss with PCP need for neurology referral (to manage seizures and migraines -Continue using self-coping strategies daily (music, writing, exercise) to manage emotions and life stress; consider using sleep sound app, as discussed, for calming sounds at the beginning of migraine symptoms 3. Referral(s): Integrated [employer] (In Clinic)  I discussed the assessment and treatment plan with the patient and/or parent/guardian. They were provided an opportunity to ask questions and all were answered. They agreed with the plan and demonstrated an understanding of the instructions.   They were advised to call back or seek an in-person evaluation if the symptoms worsen or if the condition fails to improve as anticipated.  Chloe Rodgers Emmanuel Ercole, LCSW
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
## 2020-09-29 NOTE — Progress Notes (Signed)
Patient arrives for pain in her L ring finger. Patient ambulated to room w/o complications and vitals taken.
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
## 2020-09-29 NOTE — ED Notes (Signed)
Unable to obtain blood work. MD pickering made aware and reports he will still d/c without blood obtained. Patient instructed to follow up with MD and shown number and told to call today. Patient reports understanding.

## 2020-09-29 NOTE — Discharge Instructions (Addendum)
Follow-up with Dr. Roney Mans.  Call today or tomorrow for an appointment tomorrow or the next day.  Take the new antibiotics.  Continue to take your home pain medicines to help with the pain

## 2020-09-29 NOTE — ED Triage Notes (Signed)
Pt from home. Pt received stitches on 3/12 and wasdue to have them removed on 3/19. Pt complains of no feeling in her left ring finger with some swelling and redness. Pt. Does have good cap refill.

## 2020-10-02 ENCOUNTER — Ambulatory Visit: Payer: Medicaid Other | Admitting: Physical Therapy

## 2020-10-02 ENCOUNTER — Encounter: Payer: Self-pay | Admitting: Physical Therapy

## 2020-10-02 ENCOUNTER — Other Ambulatory Visit: Payer: Self-pay

## 2020-10-02 DIAGNOSIS — R32 Unspecified urinary incontinence: Secondary | ICD-10-CM

## 2020-10-02 DIAGNOSIS — M6281 Muscle weakness (generalized): Secondary | ICD-10-CM

## 2020-10-02 DIAGNOSIS — R252 Cramp and spasm: Secondary | ICD-10-CM | POA: Diagnosis present

## 2020-10-02 NOTE — Therapy (Signed)
[facility] [address] [place], [place], [address] Phone: [phone]   Fax:  [phone]  Physical Therapy Treatment  Patient Details  Name: Chloe Rodgers MRN: [id number] Date of Birth: 01-02-1991 Referring Provider (PT): Dr. Carmel Sacramento   Encounter Date: 10/02/2020   PT End of Session - 10/02/20 0843    Visit Number 3    Date for PT Re-Evaluation 10/16/20    Authorization Type Medicaid    Authorization Time Period 2/24-5/18    Authorization - Visit Number 1    Authorization - Number of Visits 12    PT Start Time 0800    PT Stop Time 0838    PT Time Calculation (min) 38 min    Activity Tolerance Patient tolerated treatment well    Behavior During Therapy [facility] for tasks assessed/performed           Past Medical History:  Diagnosis Date  . Anemia   . Anxiety   . Chlamydia   . Complication of anesthesia    ??, seizure with wisdom teeth  . Depression    not on meds, sees a therapist  . Family history of breast cancer   . Family history of colon cancer   . Infection    UTI  . Kidney stone    kidney stones  . Migraine   . PID (acute pelvic inflammatory disease) 2014  . PONV (postoperative nausea and vomiting)   . Psoriasis   . Seizures [facility])    July 2013 - [place]    Past Surgical History:  Procedure Laterality Date  . CESAREAN SECTION    . CESAREAN SECTION  06/06/2012   Procedure: CESAREAN SECTION;  Surgeon: Adam Phenix, MD;  Location: [facility];  Service: Obstetrics;  Laterality: N/A;  . CESAREAN SECTION N/A 03/30/2017   Procedure: REPEAT CESAREAN SECTION;  Surgeon: Lazaro Arms, MD;  Location: [facility]IRTHING SUIT[facility];  Service: Obstetrics;  Laterality: N/A;  . SKIN GRAFT     off abd, onto arm  . WISDOM TOOTH EXTRACTION      There were no vitals filed for this visit.   Subjective Assessment - 10/02/20 0814    Subjective The pelvic pain is the same. I have been exercising more and feel a  little bit better. I am working now I am doing [employer] work, Water [employer]. All my kids will be in school so I will have more time.    Patient Stated Goals work on pain    Currently in Pain? Yes    Pain Score 6     Pain Location Pelvis    Pain Orientation Lower    Pain Descriptors / Indicators Tender;Sore    Pain Type Acute pain    Pain Onset More than a month ago    Pain Frequency Constant    Aggravating Factors  laying down too long, sitting too long, bending over    Pain Relieving Factors medicine, exercise    Multiple Pain Sites No                             [facility] Adult PT Treatment/Exercise - 10/02/20 0001      Self-Care   Self-Care Other Self-Care Comments    Other Self-Care Comments  educated patient on how to sign into her My chart to see when her appointments are, resourses for grief, resources for daily life needs, and counseling  Therapeutic Activites    Therapeutic Activities Other Therapeutic Activities    Other Therapeutic Activities instructed patient on correct toileting technique, diaphragmatic breathing to relax the pelvic floor       Manual Therapy   Manual Therapy Soft tissue mobilization    Manual therapy comments educated patient on how to perform at home and you tube video to follow to reduce her constipation from her pain pills    Soft tissue mobilization abdominal massage including circular motion, swooping motion around the intestines, vibration along the intestines, the bring tissue from the back tot he front                  PT Education - 10/02/20 0839    Education Details education on abdominal massage on youtube video; resources for assitance on grief, help at home with life and how to link up with My chart; education on toileting    Person(s) Educated Patient    Methods Explanation;Demonstration;Verbal cues;Handout    Comprehension Returned demonstration;Verbalized understanding            PT Short Term Goals -  10/02/20 0847      PT SHORT TERM GOAL #1   Title ind with initial HEP    Baseline still learning    Time 4    Period Weeks    Status On-going             PT Long Term Goals - 07/24/20 1142      PT LONG TERM GOAL #1   Title pt will be ind with advanced HEP for pain management    Baseline not educated yet    Time 12    Period Weeks    Status New    Target Date 10/16/20      PT LONG TERM GOAL #2   Title Pt will report  pain is </= 4/10 during daily activities so she can perform tasks such as sitting for 30 minutes    Baseline pain level is 9/10    Time 12    Period Weeks    Status New    Target Date 10/16/20      PT LONG TERM GOAL #3   Title Patient is able to stand for 15-20 minutes with weight shifting to wait on a line with pain level </= 4/10    Baseline pain level 9/10    Time 12    Period Weeks    Status New    Target Date 10/16/20      PT LONG TERM GOAL #4   Title pt will be able to perfrom functional transfers without increased pain due to increased core strength.    Baseline pain level 9/10    Time 12    Period Weeks    Status New    Target Date 10/16/20      PT LONG TERM GOAL #5   Title ----    Baseline ---                 Plan - 10/02/20 0844    Clinical Impression Statement Patient does not have my chart so she did not realize she had an appointment so therapist instructed patient on how to sign in. Patient is getting constipated with her pain medication. today we went over abdominal massage and how to perform at home. Instructed pateint on toileting and the correct breathing technique to make it easier to push her stool out and not strain the pelvic floor Patient will  benefit from skilled therapy to timprove pelvic floor coordination and reduce pain    Personal Factors and Comorbidities Comorbidity 2;Behavior Pattern;Fitness;Past/Current Experience;Time since onset of injury/illness/exacerbation    Comorbidities Interstitial Cystitis,  recurring Bacterial Vaginosis, C-section 2x,    Examination-Activity Limitations Caring for Others;Continence;Toileting;Sit;Stand    Examination-Participation Restrictions Community Activity;Driving    Stability/Clinical Decision Making Evolving/Moderate complexity    Rehab Potential Excellent    PT Frequency 1x / week    PT Duration 12 weeks    PT Treatment/Interventions ADLs/Self Care Home Management;Biofeedback;Cryotherapy;Electrical Stimulation;Moist Heat;Neuromuscular re-education;Therapeutic exercise;Therapeutic activities;Patient/family education;Manual techniques    PT Next Visit Plan assess pelvic floor next visit; diaphragmatic breathing, fem fusion pelvic floor manual therapy; write renewal for MD    Consulted and Agree with Plan of Care Patient           Patient will benefit from skilled therapeutic intervention in order to improve the following deficits and impairments:  Decreased coordination,Increased fascial restricitons,Pain,Decreased strength,Decreased activity tolerance,Increased muscle spasms,Decreased endurance  Visit Diagnosis: Muscle weakness (generalized)  Cramp and spasm  Urinary incontinence, unspecified type     Problem List Patient Active Problem List   Diagnosis Date Noted  . BV (bacterial vaginosis) 09/09/2020  . Genetic testing 03/07/2020  . Family history of breast cancer   . Family history of colon cancer   . Chronic PID (chronic pelvic inflammatory disease) 12/13/2017  . Nephrolithiasis 02/22/2013  . Migraines 07/24/2012  . Obese 03/30/2012  . Seizure disorder (HCC) 02/03/2012  . Family history of breast cancer in mother 02/03/2012    Eulis Foster, PT 10/02/20 8:48 AM   [facility] [address] [place], [place], [address] Phone: [phone]   Fax:  [phone]  Name: Chloe Rodgers MRN: [id number] Date of Birth: [birth year]

## 2020-10-02 NOTE — Patient Instructions (Addendum)
Abdominal Massage for Constipation #celebratemuliebrity for [employer] /[email or web address].[address] of [place] [address] [place], [place] [address] [phone]  [facility] [address], [place], [place] [address] [phone]  Toileting Techniques for Bowel Movements    An Evacuation/Defecation Plan   Here are the 4 basic points:  1. Lean forward enough for your elbows to rest on your knees 2. Support your feet on the floor or use a low stool if your feet don't touch the floor  3. Push out your belly as if you have swallowed a beach ball--you should feel a widening of your waist. "Belly Big, Belly Hard" 4. Open and relax your pelvic floor muscles, rather than tightening around the anus  While you are sitting on the toilet pay attention to the following areas: . Jaw and mouth position- relaxed not clenched . Angle of your hips - leaning slightly forward . Whether your feet touch the ground or not - should be flat and supported . Arm placement - rest against your thighs . Spine position - flat back . Waist . Breathing - exhale as you push (like blowing up a balloon or try using other sounds such as ahhhh, shhhhh, ohhhh or grrrrrrr) . Belly - hard and tight as you push . Anus (opening of the anal canal) - relaxed and open as you push . Anus - Tighten and lift pulling the muscle back in after you are done or if taking a break  If you are not successful after 10-15 minutes, try again later.  Avoid negative self-talk about your toileting experience.   Read this for more details and ask your PT if you need suggestions for adjustments or limitations:  1) Sitting on the toilet  a) Make sure your feet are supported - flat on the floor or step stool b) Many people find it effective to lean forward or raise their knees.  Propping your feet on a step stool (Squatty Potty is a brand name) can  help the muscles around the anus to relax  c) When you lean forward, place your forearms on your thighs for support  2) Relaxing a) Breathe deeply and slowly in through your nose and out through your mouth. b) To become aware of how to relax your muscles, contracting and releasing muscles can be helpful.  Pull your pelvic floor muscles in tightly by using the image of holding back gas, or closing around the anus (visualize making a circle smaller) and lifting the anus up and in.  Then release the muscles and your anus should drop down and feel open. Repeat 5 times ending with the feeling of relaxation. c) Keep your pelvic floor muscles relaxed; let your belly bulge out. d) The digestive tract starts at the mouth and ends at the anal opening, so be sure to relax both ends of the tube.  Place your tongue on the roof of your mouth with your teeth separated.  This helps relax your mouth and will help to relax the anus at the same time.  3) Emptying (defecation) a) Keep your pelvic floor and sphincter relaxed, then bulge your anal muscles.  Make the anal opening wide.  b) Stick your belly out as if you have swallowed a beach ball. c) Make your belly wall hard using your belly muscles while continuing to breathe. Doing this makes it easier to open your anus. d) Breath out and give a grunt (or try  using other sounds such as ahhhh, shhhhh, ohhhh or grrrrrrr). e)  Can also try to act as if you are blowing up a balloon as you push  4) Finishing a) As you finish your bowel movement, pull the pelvic floor muscles up and in.  This will leave your anus in the proper place rather than remaining pushed out and down. If you leave your anus pushed out and down, it will start to feel as though that is normal and give you incorrect signals about needing to have a bowel movement.  [facility] [address], [address] [place], [place] [address] Phone # [phone] Fax [phone]  My  chart Adamary Savary 2018 Password: [phone]

## 2020-10-10 ENCOUNTER — Ambulatory Visit (INDEPENDENT_AMBULATORY_CARE_PROVIDER_SITE_OTHER): Payer: Medicaid Other | Admitting: Clinical

## 2020-10-10 DIAGNOSIS — F4310 Post-traumatic stress disorder, unspecified: Secondary | ICD-10-CM

## 2020-10-10 DIAGNOSIS — F4321 Adjustment disorder with depressed mood: Secondary | ICD-10-CM

## 2020-10-10 DIAGNOSIS — Z658 Other specified problems related to psychosocial circumstances: Secondary | ICD-10-CM

## 2020-10-16 ENCOUNTER — Ambulatory Visit: Payer: Medicaid Other | Admitting: Physical Therapy

## 2020-10-22 ENCOUNTER — Ambulatory Visit: Payer: Medicaid Other | Attending: Nurse Practitioner | Admitting: Physical Therapy

## 2020-10-22 ENCOUNTER — Other Ambulatory Visit: Payer: Self-pay

## 2020-10-22 ENCOUNTER — Encounter: Payer: Self-pay | Admitting: Physical Therapy

## 2020-10-22 DIAGNOSIS — G8929 Other chronic pain: Secondary | ICD-10-CM | POA: Diagnosis present

## 2020-10-22 DIAGNOSIS — M545 Low back pain, unspecified: Secondary | ICD-10-CM | POA: Diagnosis present

## 2020-10-22 DIAGNOSIS — R32 Unspecified urinary incontinence: Secondary | ICD-10-CM | POA: Diagnosis present

## 2020-10-22 DIAGNOSIS — R252 Cramp and spasm: Secondary | ICD-10-CM | POA: Diagnosis present

## 2020-10-22 DIAGNOSIS — M6281 Muscle weakness (generalized): Secondary | ICD-10-CM | POA: Insufficient documentation

## 2020-10-22 NOTE — Therapy (Signed)
[facility] [address] [place], [place], [address] Phone: [phone]   Fax:  [phone]  Physical Therapy Treatment  Patient Details  Name: Chloe Rodgers MRN: [id number] Date of Birth: 01-18-1991 Referring Provider (PT): Dr. Carmel Sacramento   Encounter Date: 10/22/2020   PT End of Session - 10/22/20 0911    Visit Number 4    Date for PT Re-Evaluation 02/26/21    Authorization Type Medicaid    Authorization Time Period 2/24-5/18    Authorization - Visit Number 2    Authorization - Number of Visits 12    PT Start Time 0845    PT Stop Time 0925    PT Time Calculation (min) 40 min    Activity Tolerance Patient tolerated treatment well    Behavior During Therapy [facility] for tasks assessed/performed           Past Medical History:  Diagnosis Date  . Anemia   . Anxiety   . Chlamydia   . Complication of anesthesia    ??, seizure with wisdom teeth  . Depression    not on meds, sees a therapist  . Family history of breast cancer   . Family history of colon cancer   . Infection    UTI  . Kidney stone    kidney stones  . Migraine   . PID (acute pelvic inflammatory disease) 2014  . PONV (postoperative nausea and vomiting)   . Psoriasis   . Seizures [facility])    July 2013 - [place]    Past Surgical History:  Procedure Laterality Date  . CESAREAN SECTION    . CESAREAN SECTION  06/06/2012   Procedure: CESAREAN SECTION;  Surgeon: Adam Phenix, MD;  Location: [facility];  Service: Obstetrics;  Laterality: N/A;  . CESAREAN SECTION N/A 03/30/2017   Procedure: REPEAT CESAREAN SECTION;  Surgeon: Lazaro Arms, MD;  Location: [facility];  Service: Obstetrics;  Laterality: N/A;  . SKIN GRAFT     off abd, onto arm  . WISDOM TOOTH EXTRACTION      There were no vitals filed for this visit.   Subjective Assessment - 10/22/20 0855    Subjective My kidney stones are hurting again. Missed the urologist  appointment due to my daughter having an appointment. I have a UTI.    Patient Stated Goals work on pain    Currently in Pain? Yes    Pain Score 8     Pain Location Pelvis    Pain Orientation Lower    Pain Descriptors / Indicators Sore;Tender    Pain Type Acute pain    Pain Onset More than a month ago    Pain Frequency Constant    Aggravating Factors  laying down to long, sitting too long, bending over    Pain Relieving Factors medicine, exercise    Multiple Pain Sites No              [facility] PT Assessment - 10/22/20 0001      Assessment   Medical Diagnosis R10.2 pelvic pain    Referring Provider (PT) Dr. Carmel Sacramento    Onset Date/Surgical Date --   chronic   Prior Therapy yes      Precautions   Precautions None      Home Environment   Living Environment Private residence      Prior Function   Level of Independence Independent      Cognition   Overall Cognitive Status Within  Functional Limits for tasks assessed      Observation/Other Assessments   Focus on Therapeutic Outcomes (FOTO)  UIQ-7 38; CRAIQ-7 0; POPIQ-7 57; PFIQ-7 95      Posture/Postural Control   Posture/Postural Control Postural limitations    Posture Comments increased abdominal mass      PROM   Right Hip Flexion 115    Right Hip External Rotation  55    Left Hip Flexion 115    Left Hip External Rotation  55      Strength   Right Hip ABduction 5/5    Right Hip ADduction 5/5    Left Hip ABduction 5/5    Left Hip ADduction 5/5                      Pelvic Floor Special Questions - 10/22/20 0001    Number of Pregnancies 3    Number of C-Sections 3    Currently Sexually Active No    Urinary Leakage Yes    Activities that cause leaking Other;Laughing;Coughing    Other activities that cause leaking laying down    Urinary urgency --   does not get the urge to urinate   Fecal incontinence --   constipation due to pain medication   Exam Type Deferred   due to having bacterial  vaginosis            [facility] Adult PT Treatment/Exercise - 10/22/20 0001      Self-Care   Self-Care Other Self-Care Comments    Other Self-Care Comments  education on vaginal health, wearing cotton underwear , not using soap that is fragrance, reducing sugar, how to change her Ph level of the vagina      Lumbar Exercises: Stretches   Active Hamstring Stretch Right;Left;1 rep;30 seconds    Active Hamstring Stretch Limitations supine wtih strap    Lower Trunk Rotation 2 reps;30 seconds    ITB Stretch Left;1 rep;Right;30 seconds    ITB Stretch Limitations supine with a strap    Other Lumbar Stretch Exercise happy baby with one leg using a strap    Other Lumbar Stretch Exercise butteryfly stetch for 1 miute      Manual Therapy   Manual Therapy Soft tissue mobilization;Myofascial release    Manual therapy comments to assess for dry needling    Soft tissue mobilization scar massage to release the scar tissue and improve scar mobility;    Myofascial Release fascial release of the lower abdominal area to release the tissue and improve fascial mobility            Trigger Point Dry Needling - 10/22/20 0001    Consent Given? Yes    Education Handout Provided Yes    Dry Needling Comments along the c-section scar                PT Education - 10/22/20 1601    Education Details education on vaginal health; information on dry needling    Person(s) Educated Patient    Methods Explanation    Comprehension Verbalized understanding            PT Short Term Goals - 10/22/20 0915      PT SHORT TERM GOAL #1   Title ind with initial HEP    Time 4    Period Weeks    Status Achieved             PT Long Term Goals - 10/22/20 [id number]  PT LONG TERM GOAL #1   Title pt will be ind with advanced HEP for pain management    Baseline still being educated    Time 12    Period Weeks    Status On-going      PT LONG TERM GOAL #2   Title Pt will report  pain is </= 4/10 during  daily activities so she can perform tasks such as sitting for 30 minutes    Baseline pain level is 8/10    Time 12    Period Weeks    Status On-going      PT LONG TERM GOAL #3   Title Patient is able to stand for 15-20 minutes with weight shifting to wait on a line with pain level </= 4/10    Baseline pain level 7/10    Time 12    Period Weeks    Status On-going      PT LONG TERM GOAL #4   Title pt will be able to perfrom functional transfers without increased pain due to increased core strength.    Baseline pain level 9/10    Time 12    Period Weeks    Status On-going                 Plan - 10/22/20 1602    Clinical Impression Statement Patient has a UTI at this time. She is having increased pain due to the UTI. Patient responded well to the dry needling and improved tissue mobility above the pubic bone. Patient has restrictions on her c-section scar. Patient has increased in bilateral hip ROM for flexion and external rotation so they are within normal limits. Patient pain level is 8/10 with standing on line, sitting for 30 minutes and daily tasks. Patient reports she has less pain after therapy. Patient continues to have issues with constipation but also takes pain medication regularly. Patient will benefit from skilled therapy to improve pelvic floor coordination and reduce pain.    Personal Factors and Comorbidities Comorbidity 2;Behavior Pattern;Fitness;Past/Current Experience;Time since onset of injury/illness/exacerbation    Comorbidities Interstitial Cystitis, recurring Bacterial Vaginosis, C-section 2x,    Examination-Activity Limitations Caring for Others;Continence;Toileting;Sit;Stand    Examination-Participation Restrictions Community Activity;Driving    Stability/Clinical Decision Making Evolving/Moderate complexity    Rehab Potential Excellent    PT Frequency 1x / week    PT Duration 12 weeks    PT Treatment/Interventions ADLs/Self Care Home  Management;Biofeedback;Cryotherapy;Electrical Stimulation;Moist Heat;Neuromuscular re-education;Therapeutic exercise;Therapeutic activities;Patient/family education;Manual techniques    PT Next Visit Plan assess pelvic floor next visit; diaphragmatic breathing, fem fusion pelvic floor manual therapy; put in for medicaid renewal    Consulted and Agree with Plan of Care Patient           Patient will benefit from skilled therapeutic intervention in order to improve the following deficits and impairments:  Decreased coordination,Increased fascial restricitons,Pain,Decreased strength,Decreased activity tolerance,Increased muscle spasms,Decreased endurance  Visit Diagnosis: Muscle weakness (generalized) - Plan: PT plan of care cert/re-cert  Cramp and spasm - Plan: PT plan of care cert/re-cert  Urinary incontinence, unspecified type - Plan: PT plan of care cert/re-cert  Chronic low back pain without sciatica, unspecified back pain laterality - Plan: PT plan of care cert/re-cert     Problem List Patient Active Problem List   Diagnosis Date Noted  . BV (bacterial vaginosis) 09/09/2020  . Genetic testing 03/07/2020  . Family history of breast cancer   . Family history of colon cancer   . Chronic PID (chronic  pelvic inflammatory disease) 12/13/2017  . Nephrolithiasis 02/22/2013  . Migraines 07/24/2012  . Obese 03/30/2012  . Seizure disorder (HCC) 02/03/2012  . Family history of breast cancer in mother 02/03/2012    Eulis Foster, PT 10/22/20 4:11 PM    [facility] [address], [address] [place], [place], [address] Phone: [phone]   Fax:  [phone]  Name: Chloe Rodgers MRN: [id number] Date of Birth: [birth year]

## 2020-10-22 NOTE — Patient Instructions (Addendum)
Healthy Vaginal pH   . Normal levels are 3.8 - 4.5 . Irritation can occur when pH changes  Testing pH: . pH strips can order on Amazon or buy at most drug stores . Place in introitus and let the test strip become moistened and follow instructions on box to check pH level  Symptoms of unhealthy levels may include: . Abnormal smell . Itchiness . Redness/rash . Abnormal discharge  Causes of high pH: . Not absorbing estrogen correcty (talk to OB/gyn or primary medical provider) . Candidiasis caused by any of the following:  o consuming sugar, pregnancy, hormonal imbalance, stress, lack of sleep . Bacterial infections  Some remedies to balance pH: Marland Kitchen Avoid sugar and processed food . Stress reduction, get plenty of sleep (7-8 hours/night) . Vitanica - Yeast Arrest - Homeopathic Vaginal Suppositories (available on amazon) . Essential oils: Mix the following:  o 1 Tbsp clarified coconut oil or high quality, cold pressed olive oil o 2 drop melaleuka (tea tree essential oil) from high quality source o 1-2 drops lavender essential oil from high quality source o Soak a tampon (preferably organic cotton) in the oil mixture until saturated  o Insert tampon in the vagina, leave for 4 hours.  Repeat 2x/day for 3-7 days . Antibiotics (if recommended by MD)  Hormone therapies (if recommended by MD)  Irritants/Allergens ( on exposure, can cause immediate stinging or burning)  Body fluids: urine, feces, vaginal discharge, sweat, semen  Feminine Hygiene Products: douches, yogurt douche, feminine wipes, baby wipes, sanitary pads, panty liners, tampons, deodorants (including deodorants in tampons/pads), lotions, powders (including talcum powder), perfumes, shampoos,soaps  Sexual support: lubricants, condoms, diaphragms, spermicides, arousal stimulants  Laundry detergent, bleach, fabric softener  Cleansing products: soap, bubble baths and salts, shampoo, conditioner, perfumed toielt  paper  Physical irritants: tight fitting clothes, Nylon underwear, synthetic undergarments/pantyhose, chemically treated clothing, latex, wash cloths, sponges, hot water, excessive washing, vigorous drying with towel/hair dryer  HPV medication, tea tree oil, [place]  Alcohol and astringents  Topical medicaments: Benzocaine, Neomycin, Chlorhexidine (in K-Y Jelly), Imidazole antifungal, Propylene glycol ( Preservative used in many products:, tea tree oil, preservatives and bases medications are placed in.   Adapted from the V Book by Gardiner Ramus. Roseanne Reno, MD., and Gwynneth Albright [facility] Books, 2002), and Proceedings in Obstetrics and Gynecology, 2014; 4(2):1    Gentle Vulvar care- Adapted from the National Vulvodynia Association and the V Book  Wear loose clothing.  Wear all-cotton underwear, and go without when at home. Avoid wearing pantyhose, wear thigh high or knee high pantyhose. If you do not use underwear with yoga pants, try using an all-cotton against the vulva. Tight clothing can be irritating to the vulva by increasing friction and exacerbate irritation and redness.  Wear loose fitting clothes when possible. When swimming, remove wet bathing suits and shower to remove cholerine from the area.Marland Kitchen  Avoid hot tubs and heavily chlorinated pools. Shower after exercise to eliminate excess consider avoiding exercise that causes friction or pressure to the area such as horseback riding, bicycle riding, and spinning classes.   To cleanse the area, use your fingers instead of a washcloth and plain water.  If you feel you must use a cleanser, unscented, non-alkaline cleanser such as Cetaphil, or mild soaps made for sensitive skin such as Dove or Neurtrogena. Be sure any cleaners are unscented. Avoid rubbing the vulva. Soak for five minutes in lukewarm water to remove any residue of sweat or lotions or other products.  Pat dry, and  apply any prescribed medication. Avoid products with multiple  ingredients.  Even those that sound designed for vulvar care, like A& D Original Ointment, baby lotion, or Vagisil, contain chemicals that could irritate or cause contact dermatitis. Avoid using bubble bath, feminine hygiene sprays, or any type of perfumed creams or sprays near the vulva.  In the bathroom, forgo moistened wipes. If you want moisture, use a spray bottle with plain water, and then pat dry. Use soft, white, unscented toilet paper. Avoid repetitive back and forth motion or rubbing. Drink plenty of water and fluids to dilute urine. Concentrated urine can be irritating to the vulvar skin.   When doing laundry, use hypoallergenic laundry detergent that is very mild, such as those made for babies. Avoid using fabric softeners with undergarments and double rinse undergarments.  For sexual intercourse, be sure to use a lubricant that is without additives or preservatives.  Additives like bactericides, spermicides, warming agents, and flavors can cause vulvar irritation. Rinse the vulva after intercourse. A frozen gel pack wrapped in a towel can be used for irritation after exercise or intercourse.  [facility] [address], [address] [place], [place] [address] Phone # [phone] Fax [phone]   Trigger Point Dry Needling  . What is Trigger Point Dry Needling (DN)? o DN is a physical therapy technique used to treat muscle pain and dysfunction. Specifically, DN helps deactivate muscle trigger points (muscle knots).  o A thin filiform needle is used to penetrate the skin and stimulate the underlying trigger point. The goal is for a local twitch response (LTR) to occur and for the trigger point to relax. No medication of any kind is injected during the procedure.   . What Does Trigger Point Dry Needling Feel Like?  o The procedure feels different for each individual patient. Some patients report that they do not actually feel the needle enter the skin and overall the  process is not painful. Very mild bleeding may occur. However, many patients feel a deep cramping in the muscle in which the needle was inserted. This is the local twitch response.   Marland Kitchen How Will I feel after the treatment? o Soreness is normal, and the onset of soreness may not occur for a few hours. Typically this soreness does not last longer than two days.  o Bruising is uncommon, however; ice can be used to decrease any possible bruising.  o In rare cases feeling tired or nauseous after the treatment is normal. In addition, your symptoms may get worse before they get better, this period will typically not last longer than 24 hours.   . What Can I do After My Treatment? o Increase your hydration by drinking more water for the next 24 hours. o You may place ice or heat on the areas treated that have become sore, however, do not use heat on inflamed or bruised areas. Heat often brings more relief post needling. o You can continue your regular activities, but vigorous activity is not recommended initially after the treatment for 24 hours. o DN is best combined with other physical therapy such as strengthening, stretching, and other therapies.    [facility] [address] [place], [place] [address] Phone # [phone] Fax [phone]

## 2020-11-21 ENCOUNTER — Ambulatory Visit: Payer: Medicaid Other | Attending: Nurse Practitioner | Admitting: Physical Therapy

## 2020-11-21 ENCOUNTER — Encounter: Payer: Self-pay | Admitting: Physical Therapy

## 2020-11-21 ENCOUNTER — Other Ambulatory Visit: Payer: Self-pay

## 2020-11-21 DIAGNOSIS — R32 Unspecified urinary incontinence: Secondary | ICD-10-CM | POA: Diagnosis present

## 2020-11-21 DIAGNOSIS — M545 Low back pain, unspecified: Secondary | ICD-10-CM | POA: Insufficient documentation

## 2020-11-21 DIAGNOSIS — M6281 Muscle weakness (generalized): Secondary | ICD-10-CM | POA: Insufficient documentation

## 2020-11-21 DIAGNOSIS — R252 Cramp and spasm: Secondary | ICD-10-CM | POA: Diagnosis present

## 2020-11-21 DIAGNOSIS — G8929 Other chronic pain: Secondary | ICD-10-CM | POA: Diagnosis present

## 2020-11-21 NOTE — Therapy (Signed)
[facility] [address], [address] [place], [place], [address] Phone: [phone]   Fax:  [phone]  Physical Therapy Treatment  Patient Details  Name: Chloe Rodgers MRN: [id number] Date of Birth: 1991/06/10 Referring Provider (PT): Dr. Carmel Sacramento   Encounter Date: 11/21/2020   PT End of Session - 11/21/20 0841    Visit Number 5    Date for PT Re-Evaluation 02/26/21    Authorization Type Medicaid    Authorization Time Period 2/24-5/18    Authorization - Visit Number 3    Authorization - Number of Visits 12    PT Start Time 0800    PT Stop Time 0830   had to leave early to take son to the doctor   PT Time Calculation (min) 30 min    Activity Tolerance Patient tolerated treatment well    Behavior During Therapy [facility] for tasks assessed/performed           Past Medical History:  Diagnosis Date  . Anemia   . Anxiety   . Chlamydia   . Complication of anesthesia    ??, seizure with wisdom teeth  . Depression    not on meds, sees a therapist  . Family history of breast cancer   . Family history of colon cancer   . Infection    UTI  . Kidney stone    kidney stones  . Migraine   . PID (acute pelvic inflammatory disease) 2014  . PONV (postoperative nausea and vomiting)   . Psoriasis   . Seizures [facility])    July 2013 - [place]    Past Surgical History:  Procedure Laterality Date  . CESAREAN SECTION    . CESAREAN SECTION  06/06/2012   Procedure: CESAREAN SECTION;  Surgeon: Adam Phenix, MD;  Location: [facility];  Service: Obstetrics;  Laterality: N/A;  . CESAREAN SECTION N/A 03/30/2017   Procedure: REPEAT CESAREAN SECTION;  Surgeon: Lazaro Arms, MD;  Location: [facility] BIRTHING SUITES;  Service: Obstetrics;  Laterality: N/A;  . SKIN GRAFT     off abd, onto arm  . WISDOM TOOTH EXTRACTION      There were no vitals filed for this visit.   Subjective Assessment - 11/21/20 0801    Subjective Pain is still good  with the medicine. I am doing my exercises at home. I do not have the feeling of a UTI. I am on some diet pills to lose weight that my pain management doctor prescribed.    Patient Stated Goals work on pain    Currently in Pain? Yes    Pain Score 6     Pain Location Pelvis    Pain Orientation Lower    Pain Descriptors / Indicators Sore;Tender    Pain Type Acute pain    Pain Onset More than a month ago    Pain Frequency Constant    Aggravating Factors  laying down to long, sitting too long, bending over    Pain Relieving Factors medicind, exercise    Multiple Pain Sites No              [facility] PT Assessment - 11/21/20 0001      Assessment   Medical Diagnosis R10.2 pelvic pain    Referring Provider (PT) Dr. Carmel [place]    Onset Date/Surgical Date --   chronic   Prior Therapy yes      Precautions   Precautions None      Home Environment   Living  Environment Private residence      Prior Function   Level of Independence Independent      Cognition   Overall Cognitive Status Within Functional Limits for tasks assessed      Observation/Other Assessments   Focus on Therapeutic Outcomes (FOTO)  UIQ-7 38; CRAIQ-7 0; POPIQ-7 57; PFIQ-7 95      Posture/Postural Control   Posture/Postural Control Postural limitations    Posture Comments increased abdominal mass      PROM   Right Hip Flexion 115    Right Hip External Rotation  55    Left Hip Flexion 115    Left Hip External Rotation  55      Strength   Right Hip ABduction 5/5    Right Hip ADduction 5/5    Left Hip ABduction 5/5    Left Hip ADduction 5/5                      Pelvic Floor Special Questions - 11/21/20 0001    Prior Pregnancies Yes    Number of Pregnancies 3    Number of C-Sections 3    Currently Sexually Active No    Urinary Leakage Yes    Activities that cause leaking Other;Laughing;Coughing    Other activities that cause leaking laying down    Urinary urgency --   does not get the urge to  urinate   Fecal incontinence --   constipation due to pain medication   External Perineal Exam negative Q-tip test    Skin Integrity Intact   dryness   External Palpation tenderness located on the mons pubis, labia Majoria, Labia Minora, ischiocavernosus, bulbocavernosus, perinal body, superficial transverse    Pelvic Floor Internal Exam Patient confirms identification and approves PT to assess pelvic floor and treatment    Exam Type Vaginal    Palpation tenderness located in levator ani, obturator internist and pain level with therapist index finger in the vaginal canal was 6/10    Tone hypertonic             [facility] Adult PT Treatment/Exercise - 11/21/20 0001      Self-Care   Self-Care Other Self-Care Comments    Other Self-Care Comments  educated patient on not using soaps with fragrance and should be Hypoallergenic including cetaphil and cerave. She is to use the [employer] with her vaginal exam to reduce her pain. educated patient on vaginal dryness and using coconut oit daily for dryness and during intercourse. Gave patient samples of [employer] lube.      Neuro Re-ed    Neuro Re-ed Details  diaphragmatic breathing to relax the pelvic floor and expand the abdomen      Lumbar Exercises: Stretches   Active Hamstring Stretch Right;Left;1 rep;30 seconds    Active Hamstring Stretch Limitations supine wtih strap    ITB Stretch Left;1 rep;Right;30 seconds    ITB Stretch Limitations supine with a strap      Manual Therapy   Manual Therapy Soft tissue mobilization    Soft tissue mobilization manual mobiliztion of the mons pubis, ischiocavernosus, bulbocavernosus, and bilateral hip adductors                  PT Education - 11/21/20 0839    Education Details education on ways to clean the vagina, using coconut oil for dryness, using lubrication with intercourse, using the [facility] Reveleum for pain relief with speculum and ask for smallest speculum with vaginal exam  Person(s) Educated Patient    Methods Explanation    Comprehension Verbalized understanding            PT Short Term Goals - 10/22/20 0915      PT SHORT TERM GOAL #1   Title ind with initial HEP    Time 4    Period Weeks    Status Achieved             PT Long Term Goals - 11/21/20 [id number]      PT LONG TERM GOAL #1   Title pt will be ind with advanced HEP for pain management    Baseline still being educated    Time 12    Period Weeks    Status On-going      PT LONG TERM GOAL #2   Title Pt will report  pain is </= 4/10 during daily activities so she can perform tasks such as sitting for 30 minutes    Baseline pain level is 6/10    Time 12    Period Weeks    Status On-going      PT LONG TERM GOAL #3   Title Patient is able to stand for 15-20 minutes with weight shifting to wait on a line with pain level </= 4/10    Baseline pain level 6/10    Time 12    Period Weeks    Status On-going      PT LONG TERM GOAL #4   Title pt will be able to perfrom functional transfers without increased pain due to increased core strength.    Baseline pain level 6/10    Time 12    Period Weeks    Status On-going      PT LONG TERM GOAL #5   Title vaginal penetration with penis or speculum pain level decreased to </= 3/10 and she is less anxioun    Time 12    Period Weeks    Status New    Target Date 02/13/21                 Plan - 11/21/20 0842    Clinical Impression Statement Patient is doing her home exercise program daily. She feels she has her pain managed for the past several weeks. Patient was able to have the therapist evaluate the pelvic floor thoroughly for the first time. Pelvic floor strength is 3/5. She has a negative Q-tip test. Vaginal dryness is present. Pain level is 6/10 when the therapist places her index finger in the vaginal canal. She has tenderness located on the hip adductors, labia maora and minora, mons pubis, obturator internist, levator ani,  bulbocavernosus, ischiocavernsus. Patient has let the therapist perfrom manual work to the external genitalia for the first time. Patient is having a vaginal exam today and very nervous due to the pain that happens during and after the exam. Instructed patient to ask for the smallest speculum and use the [facility] Reveleum with lidociane to reduce the pain. Patient has difficulty with urinating and may due to the hypertonic muscles of the pelvic floor. Patient reports her pain level with medication is 6/10 with all activities. She has restrictions in the c-section scar. Patient continues to have urinary leakage. Patient will benefit from skilled therapy to improve pelvic floor coordination and reduce pain.    Personal Factors and Comorbidities Comorbidity 2;Behavior Pattern;Fitness;Past/Current Experience;Time since onset of injury/illness/exacerbation    Comorbidities Interstitial Cystitis, recurring Bacterial Vaginosis, C-section 2x,    Examination-Activity Limitations  Caring for Others;Continence;Toileting;Sit;Stand    Examination-Participation Restrictions Community Activity;Driving    Stability/Clinical Decision Making Evolving/Moderate complexity    Rehab Potential Excellent    PT Frequency 1x / week    PT Duration 12 weeks    PT Treatment/Interventions ADLs/Self Care Home Management;Biofeedback;Cryotherapy;Electrical Stimulation;Moist Heat;Neuromuscular re-education;Therapeutic exercise;Therapeutic activities;Patient/family education;Manual techniques    PT Next Visit Plan see how MD treatment went, manual work externally to the genitalia and around the vulvar area    Consulted and Agree with Plan of Care Patient           Patient will benefit from skilled therapeutic intervention in order to improve the following deficits and impairments:  Decreased coordination,Increased fascial restricitons,Pain,Decreased strength,Decreased activity tolerance,Increased muscle spasms,Decreased  endurance  Visit Diagnosis: Muscle weakness (generalized)  Cramp and spasm  Urinary incontinence, unspecified type  Chronic low back pain without sciatica, unspecified back pain laterality     Problem List Patient Active Problem List   Diagnosis Date Noted  . BV (bacterial vaginosis) 09/09/2020  . Genetic testing 03/07/2020  . Family history of breast cancer   . Family history of colon cancer   . Chronic PID (chronic pelvic inflammatory disease) 12/13/2017  . Nephrolithiasis 02/22/2013  . Migraines 07/24/2012  . Obese 03/30/2012  . Seizure disorder (HCC) 02/03/2012  . Family history of breast cancer in mother 02/03/2012    Eulis Foster, PT 11/21/20 9:11 AM   [facility] [address], [address] [place], [place], [address] Phone: [phone]   Fax:  [phone]  Name: Chloe Rodgers MRN: [id number] Date of Birth: 07-07-1991

## 2021-01-05 ENCOUNTER — Ambulatory Visit: Payer: Medicaid Other | Admitting: Physical Therapy

## 2021-01-16 ENCOUNTER — Ambulatory Visit: Payer: Medicaid Other | Attending: Nurse Practitioner | Admitting: Physical Therapy

## 2021-01-16 ENCOUNTER — Encounter: Payer: Self-pay | Admitting: Physical Therapy

## 2021-01-16 ENCOUNTER — Other Ambulatory Visit: Payer: Self-pay

## 2021-01-16 DIAGNOSIS — R252 Cramp and spasm: Secondary | ICD-10-CM | POA: Diagnosis present

## 2021-01-16 DIAGNOSIS — M6281 Muscle weakness (generalized): Secondary | ICD-10-CM | POA: Insufficient documentation

## 2021-01-16 NOTE — Therapy (Addendum)
[facility] [address], [place], [place], [address] Phone: [phone]   Fax:  [phone]  Physical Therapy Treatment  Patient Details  Name: Chloe Rodgers MRN: [id number] Date of Birth: September 23, 1990 Referring Provider (PT): Dr. Emelia Loron   Encounter Date: 01/16/2021   PT End of Session - 01/16/21 0951     Visit Number 6    Date for PT Re-Evaluation 02/26/21    Authorization Type Medicaid    Authorization Time Period 6/27-9/18    Authorization - Visit Number 1    Authorization - Number of Visits 12    PT Start Time 0945    PT Stop Time 1010    PT Time Calculation (min) 25 min    Activity Tolerance Patient tolerated treatment well    Behavior During Therapy [facility] for tasks assessed/performed             Past Medical History:  Diagnosis Date   Anemia    Anxiety    Chlamydia    Complication of anesthesia    ??, seizure with wisdom teeth   Depression    not on meds, sees a therapist   Family history of breast cancer    Family history of colon cancer    Infection    UTI   Kidney stone    kidney stones   Migraine    PID (acute pelvic inflammatory disease) 2014   PONV (postoperative nausea and vomiting)    Psoriasis    Seizures [facility])    July 2013 - [place]    Past Surgical History:  Procedure Laterality Date   CESAREAN SECTION     CESAREAN SECTION  06/06/2012   Procedure: CESAREAN SECTION;  Surgeon: Woodroe Mode, MD;  Location: [place] [facility];  Service: Obstetrics;  Laterality: N/A;   CESAREAN SECTION N/A 03/30/2017   Procedure: REPEAT CESAREAN SECTION;  Surgeon: Florian Buff, MD;  Location: [place];  Service: Obstetrics;  Laterality: N/A;   SKIN GRAFT     off abd, onto arm   WISDOM TOOTH EXTRACTION      There were no vitals filed for this visit.   Subjective Assessment - 01/16/21 0947     Subjective I have diabetes and not feeling well. The medication is making me tired. I am  late due ot over sleeping.    Patient Stated Goals work on pain    Currently in Pain? Yes    Pain Score 7     Pain Location Pelvis    Pain Orientation Lower    Pain Descriptors / Indicators Sore;Tender    Pain Type Acute pain    Pain Onset More than a month ago    Pain Frequency Constant    Aggravating Factors  laying down to long, sitting too long, bending over    Pain Relieving Factors medicine, exercise    Multiple Pain Sites No                               [facility] Adult PT Treatment/Exercise - 01/16/21 0001       Manual Therapy   Manual Therapy Internal Pelvic Floor    Internal Pelvic Floor manual work to the introitus and going slowly while monitoring for pain, then was able to slowly place index finger fully into the vaginal canal with no pain and perform work on the levator ani  PT Short Term Goals - 10/22/20 0915       PT SHORT TERM GOAL #1   Title ind with initial HEP    Time 4    Period Weeks    Status Achieved               PT Long Term Goals - 01/16/21 1016       PT LONG TERM GOAL #1   Title pt will be ind with advanced HEP for pain management    Baseline still being educated    Time 12    Period Weeks    Status On-going      PT LONG TERM GOAL #2   Title Pt will report  pain is </= 4/10 during daily activities so she can perform tasks such as sitting for 30 minutes    Baseline pain level is 6/10    Time 12    Period Weeks    Status On-going      PT LONG TERM GOAL #3   Title Patient is able to stand for 15-20 minutes with weight shifting to wait on a line with pain level </= 4/10    Baseline pain level 6/10    Time 12    Period Weeks    Status On-going      PT LONG TERM GOAL #4   Title pt will be able to perfrom functional transfers without increased pain due to increased core strength.    Baseline pain level 6/10    Time 12    Period Weeks    Status On-going      PT LONG TERM GOAL #5    Title vaginal penetration with penis or speculum pain level decreased to </= 3/10 and she is less anxioun    Baseline today was able to tolerate internal work    Time 12    Period Weeks    Status On-going                   Plan - 01/16/21 1012     Clinical Impression Statement Patient had pain initially around the introitus while therapist did manual work but eventually was able to have the therapist place her index finger into the vaginal canal fully without pain. This is the first time she was able to tolerate interal work. Patient did not need [employer] REveleum for this. She has found out she has diabetes. Patient is using coconut oil for the vaginal dryness. Patient will benefit from skilled therapy to improve pelvic floor coordination and reduce pain.    Personal Factors and Comorbidities Comorbidity 2;Behavior Pattern;Fitness;Past/Current Experience;Time since onset of injury/illness/exacerbation    Comorbidities Interstitial Cystitis, recurring Bacterial Vaginosis, C-section 2x,    Examination-Activity Limitations Caring for Others;Continence;Toileting;Sit;Stand    Examination-Participation Restrictions Community Activity;Driving    Stability/Clinical Decision Making Evolving/Moderate complexity    Rehab Potential Excellent    PT Frequency 1x / week    PT Duration 12 weeks    PT Treatment/Interventions ADLs/Self Care Home Management;Biofeedback;Cryotherapy;Electrical Stimulation;Moist Heat;Neuromuscular re-education;Therapeutic exercise;Therapeutic activities;Patient/family education;Manual techniques    PT Next Visit Plan continue to work around the vaginal canal and internally    Recommended Other Services MD signed initial note    Consulted and Agree with Plan of Care Patient             Patient will benefit from skilled therapeutic intervention in order to improve the following deficits and impairments:  Decreased coordination, Increased fascial  restricitons, Pain, Decreased  strength, Decreased activity tolerance, Increased muscle spasms, Decreased endurance  Visit Diagnosis: Muscle weakness (generalized)  Cramp and spasm     Problem List Patient Active Problem List   Diagnosis Date Noted   BV (bacterial vaginosis) 09/09/2020   Genetic testing 03/07/2020   Family history of breast cancer    Family history of colon cancer    Chronic PID (chronic pelvic inflammatory disease) 12/13/2017   Nephrolithiasis 02/22/2013   Migraines 07/24/2012   Obese 03/30/2012   Seizure disorder ([place]) 02/03/2012   Family history of breast cancer in mother 02/03/2012    Earlie Counts, PT 01/16/21 10:17 AM  [facility] [address] [place], [place], [address] Phone: [phone]   Fax:  [phone]  Name: Chloe Rodgers MRN: [id number] Date of Birth: 1990-09-25  PHYSICAL THERAPY DISCHARGE SUMMARY  Visits from Start of Care: 6  Current functional level related to goals / functional outcomes: See above.    Remaining deficits: See above.    Education / Equipment: HEP    Patient agrees to discharge. Patient goals were not met. Patient is being discharged due to not returning since the last visit. Thank you for the referral. Earlie Counts, PT 02/26/21 9:20 AM

## 2021-01-23 ENCOUNTER — Ambulatory Visit: Payer: Medicaid Other | Admitting: Physical Therapy

## 2021-01-30 ENCOUNTER — Telehealth: Payer: Self-pay | Admitting: Physical Therapy

## 2021-01-30 ENCOUNTER — Ambulatory Visit: Payer: Medicaid Other | Admitting: Physical Therapy

## 2021-01-30 NOTE — Telephone Encounter (Signed)
Called patient. Someone answered. Left message that Chloe Rodgers called from [facility]. Called patient about her missed appointment today at 9:30.  Chloe Rodgers, PT @7 /22/2022@ 9:54 AM

## 2021-03-17 ENCOUNTER — Emergency Department (HOSPITAL_BASED_OUTPATIENT_CLINIC_OR_DEPARTMENT_OTHER)
Admission: EM | Admit: 2021-03-17 | Discharge: 2021-03-17 | Disposition: A | Discharge status: Home / Self Care | Payer: Medicaid Other | Attending: Emergency Medicine | Admitting: Emergency Medicine

## 2021-03-17 ENCOUNTER — Other Ambulatory Visit: Payer: Self-pay

## 2021-03-17 ENCOUNTER — Emergency Department (HOSPITAL_BASED_OUTPATIENT_CLINIC_OR_DEPARTMENT_OTHER): Payer: Medicaid Other

## 2021-03-17 ENCOUNTER — Encounter (HOSPITAL_BASED_OUTPATIENT_CLINIC_OR_DEPARTMENT_OTHER): Payer: Self-pay | Admitting: Emergency Medicine

## 2021-03-17 DIAGNOSIS — N739 Female pelvic inflammatory disease, unspecified: Secondary | ICD-10-CM | POA: Diagnosis not present

## 2021-03-17 DIAGNOSIS — B9689 Other specified bacterial agents as the cause of diseases classified elsewhere: Secondary | ICD-10-CM | POA: Insufficient documentation

## 2021-03-17 DIAGNOSIS — N760 Acute vaginitis: Secondary | ICD-10-CM | POA: Insufficient documentation

## 2021-03-17 DIAGNOSIS — R102 Pelvic and perineal pain: Secondary | ICD-10-CM | POA: Diagnosis present

## 2021-03-17 DIAGNOSIS — N730 Acute parametritis and pelvic cellulitis: Secondary | ICD-10-CM

## 2021-03-17 LAB — COMPREHENSIVE METABOLIC PANEL
ALT: 18 U/L (ref 0–44)
AST: 18 U/L (ref 15–41)
Albumin: 4 g/dL (ref 3.5–5.0)
Alkaline Phosphatase: 60 U/L (ref 38–126)
Anion gap: 12 (ref 5–15)
BUN: 12 mg/dL (ref 6–20)
CO2: 19 mmol/L — ABNORMAL LOW (ref 22–32)
Calcium: 9.3 mg/dL (ref 8.9–10.3)
Chloride: 105 mmol/L (ref 98–111)
Creatinine, Ser: 0.68 mg/dL (ref 0.44–1.00)
GFR, Estimated: >60 mL/min (ref >60)
Glucose, Bld: 86 mg/dL (ref 70–99)
Potassium: 3.7 mmol/L (ref 3.5–5.1)
Sodium: 136 mmol/L (ref 135–145)
Total Bilirubin: 0.4 mg/dL (ref 0.3–1.2)
Total Protein: 8.1 g/dL (ref 6.5–8.1)

## 2021-03-17 LAB — CBC WITH DIFFERENTIAL/PLATELET
Abs Immature Granulocytes: 0.01 10*3/uL (ref 0.00–0.07)
Basophils Absolute: 0 10*3/uL (ref 0.0–0.1)
Basophils Relative: 0 %
Eosinophils Absolute: 0 10*3/uL (ref 0.0–0.5)
Eosinophils Relative: 1 %
HCT: 34.1 % — ABNORMAL LOW (ref 36.0–46.0)
Hemoglobin: 10.9 g/dL — ABNORMAL LOW (ref 12.0–15.0)
Immature Granulocytes: 0 %
Lymphocytes Relative: 18 %
Lymphs Abs: 1 10*3/uL (ref 0.7–4.0)
MCH: 26 pg (ref 26.0–34.0)
MCHC: 32 g/dL (ref 30.0–36.0)
MCV: 81.4 fL (ref 80.0–100.0)
Monocytes Absolute: 0.6 10*3/uL (ref 0.1–1.0)
Monocytes Relative: 10 %
Neutro Abs: 4.1 10*3/uL (ref 1.7–7.7)
Neutrophils Relative %: 71 %
Platelets: 164 10*3/uL (ref 150–400)
RBC: 4.19 MIL/uL (ref 3.87–5.11)
RDW: 15 % (ref 11.5–15.5)
WBC: 5.8 10*3/uL (ref 4.0–10.5)
nRBC: 0 % (ref 0.0–0.2)

## 2021-03-17 LAB — URINALYSIS, ROUTINE W REFLEX MICROSCOPIC
Bilirubin Urine: NEGATIVE
Glucose, UA: NEGATIVE mg/dL
Hgb urine dipstick: NEGATIVE
Ketones, ur: NEGATIVE mg/dL
Leukocytes,Ua: NEGATIVE
Nitrite: NEGATIVE
Protein, ur: NEGATIVE mg/dL
Specific Gravity, Urine: 1.024 (ref 1.005–1.030)
pH: 6.5 (ref 5.0–8.0)

## 2021-03-17 LAB — WET PREP, GENITAL
Sperm: NONE SEEN
Trich, Wet Prep: NONE SEEN
Yeast Wet Prep HPF POC: NONE SEEN

## 2021-03-17 LAB — PREGNANCY, URINE: Preg Test, Ur: NEGATIVE

## 2021-03-17 MED ORDER — METRONIDAZOLE 500 MG PO TABS: 500.0000 mg | ORAL_TABLET | Freq: Two times a day (BID) | ORAL | 0 refills | Status: DC

## 2021-03-17 MED ORDER — LIDOCAINE HCL (PF) 1 % IJ SOLN
INTRAMUSCULAR | Status: AC
  Filled 2021-03-17: qty 5

## 2021-03-17 MED ORDER — HYDROMORPHONE HCL 1 MG/ML IJ SOLN
1.0000 mg | Freq: Once | INTRAMUSCULAR | Status: AC
  Administered 2021-03-17: 1 mg via INTRAVENOUS
  Filled 2021-03-17: qty 1

## 2021-03-17 MED ORDER — ONDANSETRON 4 MG PO TBDP: 4.0000 mg | ORAL_TABLET | Freq: Three times a day (TID) | ORAL | 0 refills | Status: DC | PRN

## 2021-03-17 MED ORDER — DOXYCYCLINE HYCLATE 100 MG PO CAPS: 100.0000 mg | ORAL_CAPSULE | Freq: Two times a day (BID) | ORAL | 0 refills | Status: DC

## 2021-03-17 MED ORDER — ONDANSETRON HCL 4 MG/2ML IJ SOLN
4.0000 mg | Freq: Once | INTRAMUSCULAR | Status: AC
  Administered 2021-03-17: 4 mg via INTRAVENOUS
  Filled 2021-03-17: qty 2

## 2021-03-17 MED ORDER — CEFTRIAXONE SODIUM 500 MG IJ SOLR
500.0000 mg | Freq: Once | INTRAMUSCULAR | Status: AC
  Administered 2021-03-17: 500 mg via INTRAMUSCULAR
  Filled 2021-03-17: qty 500

## 2021-03-17 MED ORDER — SODIUM CHLORIDE 0.9 % IV BOLUS
1000.0000 mL | Freq: Once | INTRAVENOUS | Status: AC
  Administered 2021-03-17: 1000 mL via INTRAVENOUS

## 2021-03-17 NOTE — ED Notes (Signed)
Patient verbalizes understanding of discharge instructions. Opportunity for questioning and answers were provided. Patient discharged from ED.  °

## 2021-03-17 NOTE — ED Triage Notes (Signed)
Pt arrives to ED with c/o of suprapubic pain x1 month. Pt reports she has intermittent sharp pains that are felt in the lower abdomen and vagina. Intercourse makes the pain worse. Pt reports yellow/white discharge with an odor. LMP 03/01/21. No BC. One partner in last month, does not use protection. Reports started vomiting with nausea over this weekend. No fevers. No dysuria or frequency. Has vaginal itching.

## 2021-03-17 NOTE — ED Provider Notes (Signed)
[facility] EMERGENCY DEPT Provider Note   CSN: [id number] Arrival date & time: 03/17/21  4098     History Chief Complaint  Patient presents with   Abdominal Pain    Chloe Rodgers is a 30 y.o. female.  She has a history of chronic pelvic pain.  At one point, she was diagnosed with interstitial cystitis.  She also has a history of PID.  She also endorses chronic abdominal pain but has had no work-up related to this.  About 4 days, her symptoms started to flare again, and she develops on nausea, vomiting, back pain, pelvic pain, vaginal irritation.  Currently sexually active.  The history is provided by the patient.  Female GU Problem This is a chronic problem. Episode onset: Years but this episode started about 4 days ago. The problem occurs constantly. The problem has not changed since onset.Associated symptoms include abdominal pain. Pertinent negatives include no chest pain, no headaches and no shortness of breath. The symptoms are aggravated by eating. Nothing relieves the symptoms. She has tried nothing for the symptoms. The treatment provided no relief.      Past Medical History:  Diagnosis Date   Anemia    Anxiety    Chlamydia    Complication of anesthesia    ??, seizure with wisdom teeth   Depression    not on meds, sees a therapist   Family history of breast cancer    Family history of colon cancer    Infection    UTI   Kidney stone    kidney stones   Migraine    PID (acute pelvic inflammatory disease) 2014   PONV (postoperative nausea and vomiting)    Psoriasis    Seizures (HCC)    July 2013 - [place]    Patient Active Problem List   Diagnosis Date Noted   BV (bacterial vaginosis) 09/09/2020   Genetic testing 03/07/2020   Family history of breast cancer    Family history of colon cancer    Chronic PID (chronic pelvic inflammatory disease) 12/13/2017   Nephrolithiasis 02/22/2013   Migraines 07/24/2012   Obese 03/30/2012   Seizure  disorder (HCC) 02/03/2012   Family history of breast cancer in mother 02/03/2012    Past Surgical History:  Procedure Laterality Date   CESAREAN SECTION     CESAREAN SECTION  06/06/2012   Procedure: CESAREAN SECTION;  Surgeon: Adam Phenix, MD;  Location: [facility];  Service: Obstetrics;  Laterality: N/A;   CESAREAN SECTION N/A 03/30/2017   Procedure: REPEAT CESAREAN SECTION;  Surgeon: Lazaro Arms, MD;  Location: [facility] BIRTHING SUITES;  Service: Obstetrics;  Laterality: N/A;   SKIN GRAFT     off abd, onto arm   WISDOM TOOTH EXTRACTION       OB History     Gravida  6   Para  3   Term  3   Preterm  0   AB  2   Living  3      SAB  0   IAB  1   Ectopic  0   Multiple  0   Live Births  3           Family History  Problem Relation Age of Onset   Hypertension Mother    Diabetes Mother    Stroke Mother    Seizures Mother    Breast cancer Mother        dx in her early 57s   Asthma Daughter  Hypertension Maternal Grandmother    Diabetes Maternal Grandmother    Cancer Maternal Grandmother        BONE CANCER   Breast cancer Maternal Grandmother        dx in her early 78s   Colon cancer Maternal Grandmother        dx in her 45s   Dementia Paternal Grandmother    Other Neg Hx     Social History   Tobacco Use   Smoking status: Never   Smokeless tobacco: Never  Vaping Use   Vaping Use: Never used  Substance Use Topics   Alcohol use: Yes    Comment: occ   Drug use: No    Home Medications Prior to Admission medications   Medication Sig Start Date End Date Taking? Authorizing Provider  cephALEXin (KEFLEX) 500 MG capsule Take 1 capsule (500 mg total) by mouth 4 (four) times daily. 09/20/20   Jacalyn Lefevre, MD  citalopram (CELEXA) 40 MG tablet Take 40 mg by mouth at bedtime.    [provider]  clindamycin (CLEOCIN) 300 MG capsule Take 1 capsule (300 mg total) by mouth 3 (three) times daily. 09/29/20   Benjiman Core, MD  metoCLOPramide  (REGLAN) 10 MG tablet Take 1 tablet (10 mg total) by mouth every 6 (six) hours as needed for nausea (nausea/headache). 08/10/20   Mesner, Barbara Cower, MD  metroNIDAZOLE (METROGEL) 0.75 % vaginal gel Place 1 Applicatorful vaginally at bedtime. Apply one applicatorful to vagina at bedtime for 10 days, then twice a week for 6 months. 09/09/20    Bing, MD  ondansetron (ZOFRAN ODT) 4 MG disintegrating tablet Take 1 tablet (4 mg total) by mouth every 8 (eight) hours as needed for nausea or vomiting. 08/17/20   Henderly, Britni A, PA-C  oxcarbazepine (TRILEPTAL) 600 MG tablet Take 600 mg by mouth See admin instructions. Take 1/2 tablet in the morning and 1 tablet every evening for mood.    [provider]  oxyCODONE ER (XTAMPZA ER) 18 MG C12A Take 18 mg by mouth in the morning and at bedtime.    [provider]  Oxycodone HCl 10 MG TABS Take 10 mg by mouth 3 (three) times daily. 04/21/20   [provider]  promethazine (PHENERGAN) 25 MG tablet Take 1 tablet (25 mg total) by mouth every 6 (six) hours as needed for nausea or vomiting. 11/27/18 12/27/19  Hermina Staggers, MD    Allergies    Naproxen and Toradol [ketorolac tromethamine]  Review of Systems   Review of Systems  Constitutional:  Positive for chills and fever.  HENT:  Negative for ear pain and sore throat.   Eyes:  Negative for pain and visual disturbance.  Respiratory:  Negative for cough and shortness of breath.   Cardiovascular:  Negative for chest pain and palpitations.  Gastrointestinal:  Positive for abdominal pain, constipation, nausea and vomiting.  Genitourinary:  Positive for dyspareunia, dysuria, pelvic pain and vaginal discharge. Negative for hematuria.  Musculoskeletal:  Negative for arthralgias and back pain.  Skin:  Negative for color change and rash.  Neurological:  Negative for seizures, syncope and headaches.  All other systems reviewed and are negative.  Physical Exam Updated Vital Signs BP  132/86 (BP Location: Right Arm)   Pulse 91   Temp 98.3 F (36.8 C) (Oral)   Resp 16   Ht 5\' 6"  (1.676 m)   Wt 108.9 kg   SpO2 100%   BMI 38.74 kg/m   Physical Exam Vitals and  nursing note reviewed. Exam conducted with a chaperone present.  HENT:     Head: Normocephalic and atraumatic.  Eyes:     General: No scleral icterus. Pulmonary:     Effort: Pulmonary effort is normal. No respiratory distress.  Abdominal:     General: Bowel sounds are normal.     Palpations: Abdomen is soft.     Tenderness: There is abdominal tenderness in the suprapubic area. There is right CVA tenderness and left CVA tenderness.  Genitourinary:    General: Normal vulva.     Vagina: Vaginal discharge and erythema present.     Cervix: Cervical motion tenderness present.     Uterus: Tender.      Adnexa:        Right: Tenderness present. No mass.         Left: Tenderness present. No mass.    Musculoskeletal:     Cervical back: Normal range of motion.  Skin:    General: Skin is warm and dry.  Neurological:     General: No focal deficit present.     Mental Status: She is alert and oriented to person, place, and time.  Psychiatric:        Mood and Affect: Mood normal.    ED Results / Procedures / Treatments   Labs (all labs ordered are listed, but only abnormal results are displayed) Labs Reviewed  WET PREP, GENITAL - Abnormal; Notable for the following components:      Result Value   Clue Cells Wet Prep HPF POC PRESENT (*)    WBC, Wet Prep HPF POC MANY (*)    All other components within normal limits  COMPREHENSIVE METABOLIC PANEL - Abnormal; Notable for the following components:   CO2 19 (*)    All other components within normal limits  CBC WITH DIFFERENTIAL/PLATELET - Abnormal; Notable for the following components:   Hemoglobin 10.9 (*)    HCT 34.1 (*)    All other components within normal limits  PREGNANCY, URINE  URINALYSIS, ROUTINE W REFLEX MICROSCOPIC  CBC WITH DIFFERENTIAL/PLATELET   CBC WITH DIFFERENTIAL/PLATELET  GC/CHLAMYDIA PROBE AMP ([place]) [facility]    EKG None  Radiology US PELVIC COMPLETE W TRANSVAGINAL AND TORSION R/O  Result Date: 03/17/2021 CLINICAL DATA:  Suprapubic pain for 1 month EXAM: TRANSABDOMINAL AND TRANSVAGINAL ULTRASOUND OF PELVIS DOPPLER ULTRASOUND OF OVARIES TECHNIQUE: Both transabdominal and transvaginal ultrasound examinations of the pelvis were performed. Transabdominal technique was performed for global imaging of the pelvis including uterus, ovaries, adnexal regions, and pelvic cul-de-sac. It was necessary to proceed with endovaginal exam following the transabdominal exam to visualize the adnexa and ovaries. Color and duplex Doppler ultrasound was utilized to evaluate blood flow to the ovaries. COMPARISON:  08/08/2020 FINDINGS: Uterus Measurements: 9.5 x 4.1 x 6.1 cm = volume: 124.0 mL. No fibroids or other mass visualized. Endometrium Thickness: 6 mm.  No focal abnormality visualized. Right ovary Measurements: 3.0 x 2.4 x 2.0 cm = volume: 7.7 mL. Normal appearance/no adnexal mass. Left ovary Measurements: 2.0 x 2.6 x 1.5 cm = volume: 4.2 mL. Normal appearance/no adnexal mass. Pulsed Doppler evaluation of both ovaries demonstrates normal low-resistance arterial and venous waveforms. Other findings Small amount of free pelvic fluid is likely physiologic. IMPRESSION: Normal pelvic ultrasound.  No evidence of ovarian torsion. Electronically Signed   By: Acquanetta BellingFarhaan  Mir M.D.   On: 03/17/2021 11:22    Procedures Procedures   Medications Ordered in ED Medications  ondansetron (ZOFRAN) injection 4 mg (has  no administration in time range)  sodium chloride 0.9 % bolus 1,000 mL (has no administration in time range)  HYDROmorphone (DILAUDID) injection 1 mg (has no administration in time range)    ED Course  I have reviewed the triage vital signs and the nursing notes.  Pertinent labs & imaging results that were available during my care of the  patient were reviewed by me and considered in my medical decision making (see chart for details).    MDM Rules/Calculators/A&P                           Zully Frane presents with acute on chronic pelvic pain.  She has also had vaginal pain and vaginal discharge.  She reports nausea, vomiting, and abdominal pain which is also acute on chronic.  She is well-appearing with normal vital signs.  Blood work was ordered secondary to her complaint of nausea, vomiting, and lack of p.o. intake.  No indication of dehydration.  She was given IV hydration and symptomatic management.  Pelvic ultrasound was performed to evaluate for TOA or other acute pathology.  Clinically her symptoms are suspicious for PID, and she will be treated empirically.  She does have evidence of bacterial vaginitis, and I have also prescribed metronidazole.  I do think she requires close GYN and gastroenterology follow-up for her more chronic pain.  She has been given contact information for both specialties. Final Clinical Impression(s) / ED Diagnoses Final diagnoses:  Pelvic pain  PID (acute pelvic inflammatory disease)  Bacterial vaginitis    Rx / DC Orders ED Discharge Orders          Ordered    metroNIDAZOLE (FLAGYL) 500 MG tablet  2 times daily        03/17/21 1230    doxycycline (VIBRAMYCIN) 100 MG capsule  2 times daily        03/17/21 1230    ondansetron (ZOFRAN ODT) 4 MG disintegrating tablet  Every 8 hours PRN        03/17/21 1230             Koleen Distance, MD 03/17/21 1233

## 2021-03-18 LAB — GC/CHLAMYDIA PROBE AMP (~~LOC~~) NOT AT ARMC
Chlamydia: NEGATIVE
Comment: NEGATIVE
Comment: NORMAL
Neisseria Gonorrhea: NEGATIVE

## 2021-05-14 ENCOUNTER — Other Ambulatory Visit: Payer: Self-pay

## 2021-05-14 ENCOUNTER — Emergency Department (HOSPITAL_BASED_OUTPATIENT_CLINIC_OR_DEPARTMENT_OTHER): Payer: Medicaid Other

## 2021-05-14 ENCOUNTER — Encounter (HOSPITAL_BASED_OUTPATIENT_CLINIC_OR_DEPARTMENT_OTHER): Payer: Self-pay

## 2021-05-14 ENCOUNTER — Emergency Department (HOSPITAL_BASED_OUTPATIENT_CLINIC_OR_DEPARTMENT_OTHER)
Admission: EM | Admit: 2021-05-14 | Discharge: 2021-05-14 | Disposition: A | Discharge status: Home / Self Care | Payer: Medicaid Other | Attending: Emergency Medicine | Admitting: Emergency Medicine

## 2021-05-14 DIAGNOSIS — N730 Acute parametritis and pelvic cellulitis: Secondary | ICD-10-CM | POA: Diagnosis not present

## 2021-05-14 DIAGNOSIS — A5489 Other gonococcal infections: Secondary | ICD-10-CM

## 2021-05-14 DIAGNOSIS — A7481 Chlamydial peritonitis: Secondary | ICD-10-CM | POA: Diagnosis not present

## 2021-05-14 DIAGNOSIS — N898 Other specified noninflammatory disorders of vagina: Secondary | ICD-10-CM | POA: Diagnosis not present

## 2021-05-14 DIAGNOSIS — Z79899 Other long term (current) drug therapy: Secondary | ICD-10-CM | POA: Insufficient documentation

## 2021-05-14 DIAGNOSIS — R52 Pain, unspecified: Secondary | ICD-10-CM

## 2021-05-14 DIAGNOSIS — R1011 Right upper quadrant pain: Secondary | ICD-10-CM | POA: Diagnosis present

## 2021-05-14 LAB — LIPASE, BLOOD: Lipase: 11 U/L (ref 11–51)

## 2021-05-14 LAB — COMPREHENSIVE METABOLIC PANEL
ALT: 10 U/L (ref 0–44)
AST: 14 U/L — ABNORMAL LOW (ref 15–41)
Albumin: 4.1 g/dL (ref 3.5–5.0)
Alkaline Phosphatase: 46 U/L (ref 38–126)
Anion gap: 10 (ref 5–15)
BUN: 13 mg/dL (ref 6–20)
CO2: 21 mmol/L — ABNORMAL LOW (ref 22–32)
Calcium: 8.8 mg/dL — ABNORMAL LOW (ref 8.9–10.3)
Chloride: 106 mmol/L (ref 98–111)
Creatinine, Ser: 0.81 mg/dL (ref 0.44–1.00)
GFR, Estimated: >60 mL/min (ref >60)
Glucose, Bld: 92 mg/dL (ref 70–99)
Potassium: 3.5 mmol/L (ref 3.5–5.1)
Sodium: 137 mmol/L (ref 135–145)
Total Bilirubin: 0.7 mg/dL (ref 0.3–1.2)
Total Protein: 7.8 g/dL (ref 6.5–8.1)

## 2021-05-14 LAB — CBC
HCT: 39.4 % (ref 36.0–46.0)
Hemoglobin: 12.4 g/dL (ref 12.0–15.0)
MCH: 25.6 pg — ABNORMAL LOW (ref 26.0–34.0)
MCHC: 31.5 g/dL (ref 30.0–36.0)
MCV: 81.2 fL (ref 80.0–100.0)
Platelets: 224 10*3/uL (ref 150–400)
RBC: 4.85 MIL/uL (ref 3.87–5.11)
RDW: 14.9 % (ref 11.5–15.5)
WBC: 9.2 10*3/uL (ref 4.0–10.5)
nRBC: 0 % (ref 0.0–0.2)

## 2021-05-14 LAB — URINALYSIS, ROUTINE W REFLEX MICROSCOPIC
Bilirubin Urine: NEGATIVE
Glucose, UA: NEGATIVE mg/dL
Hgb urine dipstick: NEGATIVE
Ketones, ur: NEGATIVE mg/dL
Leukocytes,Ua: NEGATIVE
Nitrite: NEGATIVE
Protein, ur: 30 mg/dL — AB
Specific Gravity, Urine: 1.033 — ABNORMAL HIGH (ref 1.005–1.030)
pH: 6 (ref 5.0–8.0)

## 2021-05-14 LAB — WET PREP, GENITAL
Sperm: NONE SEEN
Trich, Wet Prep: NONE SEEN
Yeast Wet Prep HPF POC: NONE SEEN

## 2021-05-14 LAB — PREGNANCY, URINE: Preg Test, Ur: NEGATIVE

## 2021-05-14 MED ORDER — ONDANSETRON HCL 4 MG/2ML IJ SOLN
4.0000 mg | Freq: Once | INTRAMUSCULAR | Status: AC
  Administered 2021-05-14: 4 mg via INTRAVENOUS
  Filled 2021-05-14: qty 2

## 2021-05-14 MED ORDER — SODIUM CHLORIDE 0.9 % IV SOLN
100.0000 mg | Freq: Once | INTRAVENOUS | Status: AC
  Administered 2021-05-14: 100 mg via INTRAVENOUS
  Filled 2021-05-14: qty 100

## 2021-05-14 MED ORDER — DOXYCYCLINE HYCLATE 100 MG PO CAPS: 100.0000 mg | ORAL_CAPSULE | Freq: Two times a day (BID) | ORAL | 0 refills | Status: AC

## 2021-05-14 MED ORDER — METRONIDAZOLE 500 MG PO TABS: 500.0000 mg | ORAL_TABLET | Freq: Two times a day (BID) | ORAL | 0 refills | Status: AC

## 2021-05-14 MED ORDER — SODIUM CHLORIDE 0.9 % IV SOLN: INTRAVENOUS | Status: DC | PRN

## 2021-05-14 MED ORDER — FENTANYL CITRATE PF 50 MCG/ML IJ SOSY
100.0000 ug | PREFILLED_SYRINGE | INTRAMUSCULAR | Status: DC | PRN
  Administered 2021-05-14 (×2): 100 ug via INTRAVENOUS
  Filled 2021-05-14 (×2): qty 2

## 2021-05-14 MED ORDER — ONDANSETRON 4 MG PO TBDP
4.0000 mg | ORAL_TABLET | Freq: Once | ORAL | Status: AC
  Administered 2021-05-14: 4 mg via ORAL
  Filled 2021-05-14: qty 1

## 2021-05-14 MED ORDER — FENTANYL CITRATE PF 50 MCG/ML IJ SOSY
100.0000 ug | PREFILLED_SYRINGE | Freq: Once | INTRAMUSCULAR | Status: AC
Start: 2021-05-14 — End: 2021-05-14
  Administered 2021-05-14: 100 ug via INTRAVENOUS
  Filled 2021-05-14: qty 2

## 2021-05-14 MED ORDER — IOHEXOL 300 MG/ML  SOLN
100.0000 mL | Freq: Once | INTRAMUSCULAR | Status: AC | PRN
  Administered 2021-05-14: 100 mL via INTRAVENOUS

## 2021-05-14 MED ORDER — SODIUM CHLORIDE 0.9 % IV SOLN
1.0000 g | Freq: Once | INTRAVENOUS | Status: AC
  Administered 2021-05-14: 1 g via INTRAVENOUS
  Filled 2021-05-14: qty 10

## 2021-05-14 NOTE — Discharge Instructions (Addendum)
Your CT scan and pelvic ultrasound were unremarkable.  Your symptoms are most consistent with pelvic inflammatory disease. Do not drink alcohol with Flagyl as it can cause an adverse reaction.

## 2021-05-14 NOTE — ED Provider Notes (Signed)
[facility] Provider Note: Chloe Dell, MD, FACEP  CSN: [id number] MRN: [id number] ARRIVAL: 05/14/21 at 0324 ROOM: DB011/DB011   CHIEF COMPLAINT  Abdominal Pain   HISTORY OF PRESENT ILLNESS  05/14/21 5:11 AM Chloe Rodgers is a 30 y.o. female with 2 days of abdominal pain.  Pain is located in the right upper quadrant and right flank radiating down to the right lower quadrant and suprapubic region.  She rates her pain as a 9 out of 10, worse with movement or palpation and with certain positions.  It has been associated with nausea and bilious vomiting.  She has had some diarrhea with it as well.  She is also having a vaginal discharge with pelvic pain, worse with urination.    Past Medical History:  Diagnosis Date   Anemia    Anxiety    Chlamydia    Complication of anesthesia    ??, seizure with wisdom teeth   Depression    not on meds, sees a therapist   Family history of breast cancer    Family history of colon cancer    Infection    UTI   Kidney stone    kidney stones   Migraine    PID (acute pelvic inflammatory disease) 2014   PONV (postoperative nausea and vomiting)    Psoriasis    Seizures [facility])    July 2013 - [place]    Past Surgical History:  Procedure Laterality Date   CESAREAN SECTION     CESAREAN SECTION  06/06/2012   Procedure: CESAREAN SECTION;  Surgeon: Adam Phenix, MD;  Location: [facility];  Service: Obstetrics;  Laterality: N/A;   CESAREAN SECTION N/A 03/30/2017   Procedure: REPEAT CESAREAN SECTION;  Surgeon: Lazaro Arms, MD;  Location: [facility] BIRTHING SUITES;  Service: Obstetrics;  Laterality: N/A;   SKIN GRAFT     off abd, onto arm   WISDOM TOOTH EXTRACTION      Family History  Problem Relation Age of Onset   Hypertension Mother    Diabetes Mother    Stroke Mother    Seizures Mother    Breast cancer Mother        dx in her early 75s   Asthma Daughter    Hypertension Maternal Grandmother    Diabetes Maternal Grandmother     Cancer Maternal Grandmother        BONE CANCER   Breast cancer Maternal Grandmother        dx in her early 23s   Colon cancer Maternal Grandmother        dx in her 80s   Dementia Paternal Grandmother    Other Neg Hx     Social History   Tobacco Use   Smoking status: Never   Smokeless tobacco: Never  Vaping Use   Vaping Use: Never used  Substance Use Topics   Alcohol use: Yes    Comment: occ   Drug use: No    Prior to Admission medications   Medication Sig Start Date End Date Taking? Authorizing Provider  doxycycline (VIBRAMYCIN) 100 MG capsule Take 1 capsule (100 mg total) by mouth 2 (two) times daily for 14 days. 05/14/21 05/28/21 Yes Ernie Avena, MD  metroNIDAZOLE (FLAGYL) 500 MG tablet Take 1 tablet (500 mg total) by mouth 2 (two) times daily for 14 days. 05/14/21 05/28/21 Yes Ernie Avena, MD  citalopram (CELEXA) 40 MG tablet Take 40 mg by mouth at bedtime.    [provider]  metoCLOPramide (REGLAN) 10  MG tablet Take 1 tablet (10 mg total) by mouth every 6 (six) hours as needed for nausea (nausea/headache). 08/10/20   Mesner, Barbara Cower, MD  metroNIDAZOLE (METROGEL) 0.75 % vaginal gel Place 1 Applicatorful vaginally at bedtime. Apply one applicatorful to vagina at bedtime for 10 days, then twice a week for 6 months. 09/09/20   Fairview Bing, MD  ondansetron (ZOFRAN ODT) 4 MG disintegrating tablet Take 1 tablet (4 mg total) by mouth every 8 (eight) hours as needed for nausea or vomiting. 03/17/21   Koleen Distance, MD  oxcarbazepine (TRILEPTAL) 600 MG tablet Take 600 mg by mouth See admin instructions. Take 1/2 tablet in the morning and 1 tablet every evening for mood.    [provider]  oxyCODONE ER (XTAMPZA ER) 18 MG C12A Take 18 mg by mouth in the morning and at bedtime.    [provider]  Oxycodone HCl 10 MG TABS Take 10 mg by mouth 3 (three) times daily. 04/21/20   [provider]  promethazine (PHENERGAN) 25 MG tablet Take 1 tablet (25 mg  total) by mouth every 6 (six) hours as needed for nausea or vomiting. 11/27/18 12/27/19  Hermina Staggers, MD    Allergies Naproxen and Toradol [ketorolac tromethamine]   REVIEW OF SYSTEMS  Negative except as noted here or in the History of Present Illness.   PHYSICAL EXAMINATION  Initial Vital Signs Blood pressure 130/81, pulse 96, temperature 98.6 F (37 C), temperature source Oral, resp. rate 16, height 5\' 6"  (1.676 m), weight 108.9 kg, SpO2 99 %, unknown if currently breastfeeding.  Examination General: Well-developed, high BMI female in no acute distress; appearance consistent with age of record HENT: normocephalic; atraumatic Eyes: pupils equal, round and reactive to light; extraocular muscles intact Neck: supple Heart: regular rate and rhythm Lungs: clear to auscultation bilaterally Abdomen: soft; nondistended; right sided tenderness most prominent in the right upper quadrant; bowel sounds present GU: Right CVA tenderness; normal external genitalia; significant pelvic pain on introduction of speculum necessitating use of smaller speculum; yellowish-white vaginal discharge; cervical motion tenderness; adnexal tenderness Extremities: No deformity; full range of motion; pulses normal Neurologic: Awake, alert and oriented; motor function intact in all extremities and symmetric; no facial droop Skin: Warm and dry Psychiatric: Flat affect   RESULTS  Summary of this visit's results, reviewed and interpreted by myself:   EKG Interpretation  Date/Time:    Ventricular Rate:    PR Interval:    QRS Duration:   QT Interval:    QTC Calculation:   R Axis:     Text Interpretation:         Laboratory Studies: Results for orders placed or performed during the hospital encounter of 05/14/21 (from the past 24 hour(s))  CBC     Status: Abnormal   Collection Time: 05/14/21  3:53 AM  Result Value Ref Range   WBC 9.2 4.0 - 10.5 K/uL   RBC 4.85 3.87 - 5.11 MIL/uL   Hemoglobin 12.4  12.0 - 15.0 g/dL   HCT 50.9 32.6 - 71.2 %   MCV 81.2 80.0 - 100.0 fL   MCH 25.6 (L) 26.0 - 34.0 pg   MCHC 31.5 30.0 - 36.0 g/dL   RDW 45.8 09.9 - 83.3 %   Platelets 224 150 - 400 K/uL   nRBC 0.0 0.0 - 0.2 %  Urinalysis, Routine w reflex microscopic Urine, Clean Catch     Status: Abnormal   Collection Time: 05/14/21  3:53 AM  Result Value Ref  Range   Color, Urine YELLOW YELLOW   APPearance HAZY (A) CLEAR   Specific Gravity, Urine 1.033 (H) 1.005 - 1.030   pH 6.0 5.0 - 8.0   Glucose, UA NEGATIVE NEGATIVE mg/dL   Hgb urine dipstick NEGATIVE NEGATIVE   Bilirubin Urine NEGATIVE NEGATIVE   Ketones, ur NEGATIVE NEGATIVE mg/dL   Protein, ur 30 (A) NEGATIVE mg/dL   Nitrite NEGATIVE NEGATIVE   Leukocytes,Ua NEGATIVE NEGATIVE   RBC / HPF 0-5 0 - 5 RBC/hpf   WBC, UA 0-5 0 - 5 WBC/hpf   Squamous Epithelial / LPF 21-50 0 - 5   Mucus PRESENT   Pregnancy, urine     Status: None   Collection Time: 05/14/21  3:53 AM  Result Value Ref Range   Preg Test, Ur NEGATIVE NEGATIVE  Comprehensive metabolic panel     Status: Abnormal   Collection Time: 05/14/21  4:45 AM  Result Value Ref Range   Sodium 137 135 - 145 mmol/L   Potassium 3.5 3.5 - 5.1 mmol/L   Chloride 106 98 - 111 mmol/L   CO2 21 (L) 22 - 32 mmol/L   Glucose, Bld 92 70 - 99 mg/dL   BUN 13 6 - 20 mg/dL   Creatinine, Ser 1.61 0.44 - 1.00 mg/dL   Calcium 8.8 (L) 8.9 - 10.3 mg/dL   Total Protein 7.8 6.5 - 8.1 g/dL   Albumin 4.1 3.5 - 5.0 g/dL   AST 14 (L) 15 - 41 U/L   ALT 10 0 - 44 U/L   Alkaline Phosphatase 46 38 - 126 U/L   Total Bilirubin 0.7 0.3 - 1.2 mg/dL   GFR, Estimated >09 >60 mL/min   Anion gap 10 5 - 15  Lipase, blood     Status: None   Collection Time: 05/14/21  4:45 AM  Result Value Ref Range   Lipase 11 11 - 51 U/L  Wet prep, genital     Status: Abnormal   Collection Time: 05/14/21  5:30 AM   Specimen: Genital  Result Value Ref Range   Yeast Wet Prep HPF POC NONE SEEN NONE SEEN   Trich, Wet Prep NONE SEEN NONE  SEEN   Clue Cells Wet Prep HPF POC PRESENT (A) NONE SEEN   WBC, Wet Prep HPF POC MODERATE (A) NONE SEEN   Sperm NONE SEEN    Imaging Studies: CT ABDOMEN PELVIS W CONTRAST  Result Date: 05/14/2021 CLINICAL DATA:  30 year old female with history of right lower quadrant abdominal pain. EXAM: CT ABDOMEN AND PELVIS WITH CONTRAST TECHNIQUE: Multidetector CT imaging of the abdomen and pelvis was performed using the standard protocol following bolus administration of intravenous contrast. CONTRAST:  OMNIPAQUE IOHEXOL 300 MG/ML  SOLN COMPARISON:  CT of the abdomen and pelvis 08/17/2020. FINDINGS: Lower chest: Unremarkable. Hepatobiliary: No suspicious cystic or solid hepatic lesions. No intra or extrahepatic biliary ductal dilatation. Gallbladder is normal in appearance. Pancreas: No pancreatic mass. No pancreatic ductal dilatation. No pancreatic or peripancreatic fluid collections or inflammatory changes. Spleen: Unremarkable. Adrenals/Urinary Tract: In the upper pole collecting system of the right kidney there is a tiny 3 mm nonobstructive calculus. Bilateral kidneys and bilateral adrenal glands are otherwise normal in appearance. No hydroureteronephrosis. Urinary bladder is normal in appearance. Stomach/Bowel: The appearance of the stomach is normal. No pathologic dilatation of small bowel or colon. Normal appendix. Vascular/Lymphatic: No significant atherosclerotic disease, aneurysm or dissection noted in the abdominal or pelvic vasculature. Several pelvic phleboliths are incidentally noted. No lymphadenopathy noted in  the abdomen or pelvis. Reproductive: Uterus and ovaries are unremarkable in appearance. Other: No significant volume of ascites.  No pneumoperitoneum. Musculoskeletal: There are no aggressive appearing lytic or blastic lesions noted in the visualized portions of the skeleton. IMPRESSION: 1. No acute findings are noted in the abdomen or pelvis to account for the patient's symptoms.  Specifically, the appendix is normal. 2. 3 mm nonobstructive calculus in the upper pole collecting system of the right kidney. No ureteral stones or findings of urinary tract obstruction are noted at this time. Electronically Signed   By: Trudie Reed M.D.   On: 05/14/2021 06:19   US PELVIC COMPLETE W TRANSVAGINAL AND TORSION R/O  Result Date: 05/14/2021 CLINICAL DATA:  Follow-up.  Pelvic inflammatory disease EXAM: TRANSABDOMINAL AND TRANSVAGINAL ULTRASOUND OF PELVIS DOPPLER ULTRASOUND OF OVARIES TECHNIQUE: Both transabdominal and transvaginal ultrasound examinations of the pelvis were performed. Transabdominal technique was performed for global imaging of the pelvis including uterus, ovaries, adnexal regions, and pelvic cul-de-sac. It was necessary to proceed with endovaginal exam following the transabdominal exam to visualize the uterus and adnexa. Color and duplex Doppler ultrasound was utilized to evaluate blood flow to the ovaries. COMPARISON:  CT abdomen and pelvis earlier the same day FINDINGS: Uterus Measurements: 9.8 x 4.3 x 6.7 = volume: 148 mL. No fibroids or other mass visualized. Endometrium Thickness: 4.  No focal abnormality visualized. Right ovary Measurements: 2.6 x 1.8 x 2 = volume: 5 mL. Normal appearance/no adnexal mass. Left ovary Measurements: 2 x 2.1 x 1.2 = volume: 3 mL. Normal appearance/no adnexal mass. Pulsed Doppler evaluation of both ovaries demonstrates normal low-resistance arterial and venous waveforms. Other findings No abnormal free fluid. IMPRESSION: No acute process or suspicious mass identified in the pelvis. Electronically Signed   By: Jannifer Hick M.D.   On: 05/14/2021 07:35    ED COURSE and MDM  Nursing notes, initial and subsequent vitals signs, including pulse oximetry, reviewed and interpreted by myself.  Vitals:   05/14/21 0500 05/14/21 0720 05/14/21 0721 05/14/21 0815  BP: 133/79 121/82  124/81  Pulse: 95 80 80 79  Resp: 18 15  18   Temp:       TempSrc:      SpO2: 99% 100% 100% 100%  Weight:      Height:       Medications  doxycycline (VIBRAMYCIN) 100 mg in sodium chloride 0.9 % 250 mL IVPB (100 mg Intravenous New Bag/Given 05/14/21 0819)  fentaNYL (SUBLIMAZE) injection 100 mcg (100 mcg Intravenous Given 05/14/21 0749)  0.9 %  sodium chloride infusion ( Intravenous New Bag/Given 05/14/21 0818)  ondansetron (ZOFRAN) injection 4 mg (4 mg Intravenous Given 05/14/21 0523)  fentaNYL (SUBLIMAZE) injection 100 mcg (100 mcg Intravenous Given 05/14/21 0523)  iohexol (OMNIPAQUE) 300 MG/ML solution 100 mL (100 mLs Intravenous Contrast Given 05/14/21 0602)  cefTRIAXone (ROCEPHIN) 1 g in sodium chloride 0.9 % 100 mL IVPB (0 g Intravenous Stopped 05/14/21 0830)  ondansetron (ZOFRAN-ODT) disintegrating tablet 4 mg (4 mg Oral Given 05/14/21 0726)   6:28 AM The patient's presentation, with unremarkable CT scan, 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  She has significant pelvic/cervical tenderness and does have a history of PID in the past.  We will go ahead and start IV Rocephin and doxycycline and obtain a pelvic ultrasound.  7:00 AM Signed out to Dr. 13/3/22. Karene Fry of pelvis pending.    PROCEDURES  Procedures   ED DIAGNOSES     ICD-10-CM   1. PID (acute pelvic  inflammatory disease)  N73.0                2. 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 The microbiology is pending, therefore two diagnoses have been chosen pending results.     Karmina Zufall, Jonny Ruiz, MD 05/14/21 [id number]

## 2021-05-14 NOTE — ED Triage Notes (Signed)
Patient here POV from Home with ABD Pain.  Patient states it began approximately 2 days PTA. Patient states Pain has been worsening since. Pain is mainly located in the Right and Mid ABD.  Moderate N/V/D. Patient also complains of Urinary Symptoms including Burning. NAD Noted during Triage. A&Ox4. GCS 15. Fever yesterday: 103.

## 2021-05-14 NOTE — ED Notes (Signed)
Pt laying on side with arm higher when BP taken. BP still good

## 2021-05-14 NOTE — ED Notes (Signed)
Pt stated that she felt fine 

## 2021-05-14 NOTE — ED Provider Notes (Signed)
  Physical Exam  BP 133/79   Pulse 95   Temp 98.6 F (37 C) (Oral)   Resp 18   Ht 5\' 6"  (1.676 m)   Wt 108.9 kg   SpO2 99%   BMI 38.75 kg/m     ED Course/Procedures     Procedures  MDM  30 year old female presenting to the emergency department with right upper quadrant and lower abdominal pain with associated vaginal discharge.  Please see prior ED provider's note for further MDM.  Plan at time of signout to follow-up pelvic ultrasound, discharged with prescription for doxycycline and Flagyl for presumed PID.  CT imaging negative for acute abnormalities.  Wet prep positive for WBCs and clue cells, negative for yeast and trichomonas, GC/chlamydia collected and pending.  Pelvic ultrasound performed and without evidence of tubo-ovarian abscess, generally otherwise unremarkable.  Overall stable for discharge with plan for outpatient management of her likely PID.       26, MD 05/14/21 0800

## 2021-05-15 LAB — GC/CHLAMYDIA PROBE AMP (~~LOC~~) NOT AT ARMC
Chlamydia: NEGATIVE
Comment: NEGATIVE
Comment: NORMAL
Neisseria Gonorrhea: NEGATIVE

## 2021-07-14 ENCOUNTER — Emergency Department (HOSPITAL_BASED_OUTPATIENT_CLINIC_OR_DEPARTMENT_OTHER): Payer: Medicaid Other

## 2021-07-14 ENCOUNTER — Emergency Department (HOSPITAL_BASED_OUTPATIENT_CLINIC_OR_DEPARTMENT_OTHER)
Admission: EM | Admit: 2021-07-14 | Discharge: 2021-07-14 | Disposition: A | Discharge status: Home / Self Care | Payer: Medicaid Other | Attending: Emergency Medicine | Admitting: Emergency Medicine

## 2021-07-14 ENCOUNTER — Other Ambulatory Visit: Payer: Self-pay

## 2021-07-14 ENCOUNTER — Encounter (HOSPITAL_BASED_OUTPATIENT_CLINIC_OR_DEPARTMENT_OTHER): Payer: Self-pay | Admitting: Emergency Medicine

## 2021-07-14 DIAGNOSIS — B9689 Other specified bacterial agents as the cause of diseases classified elsewhere: Secondary | ICD-10-CM | POA: Insufficient documentation

## 2021-07-14 DIAGNOSIS — R102 Pelvic and perineal pain: Secondary | ICD-10-CM

## 2021-07-14 DIAGNOSIS — N760 Acute vaginitis: Secondary | ICD-10-CM | POA: Diagnosis not present

## 2021-07-14 DIAGNOSIS — U071 COVID-19: Secondary | ICD-10-CM | POA: Diagnosis not present

## 2021-07-14 DIAGNOSIS — R509 Fever, unspecified: Secondary | ICD-10-CM | POA: Diagnosis present

## 2021-07-14 LAB — COMPREHENSIVE METABOLIC PANEL
ALT: 20 U/L (ref 0–44)
AST: 14 U/L — ABNORMAL LOW (ref 15–41)
Albumin: 4.2 g/dL (ref 3.5–5.0)
Alkaline Phosphatase: 58 U/L (ref 38–126)
Anion gap: 7 (ref 5–15)
BUN: 11 mg/dL (ref 6–20)
CO2: 25 mmol/L (ref 22–32)
Calcium: 9.1 mg/dL (ref 8.9–10.3)
Chloride: 105 mmol/L (ref 98–111)
Creatinine, Ser: 0.73 mg/dL (ref 0.44–1.00)
GFR, Estimated: >60 mL/min (ref >60)
Glucose, Bld: 93 mg/dL (ref 70–99)
Potassium: 3.7 mmol/L (ref 3.5–5.1)
Sodium: 137 mmol/L (ref 135–145)
Total Bilirubin: 0.4 mg/dL (ref 0.3–1.2)
Total Protein: 8.1 g/dL (ref 6.5–8.1)

## 2021-07-14 LAB — WET PREP, GENITAL
Sperm: NONE SEEN
Trich, Wet Prep: NONE SEEN
WBC, Wet Prep HPF POC: <10 (ref <10)
Yeast Wet Prep HPF POC: NONE SEEN

## 2021-07-14 LAB — URINALYSIS, ROUTINE W REFLEX MICROSCOPIC
Bilirubin Urine: NEGATIVE
Glucose, UA: NEGATIVE mg/dL
Hgb urine dipstick: NEGATIVE
Ketones, ur: NEGATIVE mg/dL
Nitrite: NEGATIVE
Specific Gravity, Urine: 1.026 (ref 1.005–1.030)
pH: 7 (ref 5.0–8.0)

## 2021-07-14 LAB — CBC
HCT: 37.5 % (ref 36.0–46.0)
Hemoglobin: 11.7 g/dL — ABNORMAL LOW (ref 12.0–15.0)
MCH: 25.3 pg — ABNORMAL LOW (ref 26.0–34.0)
MCHC: 31.2 g/dL (ref 30.0–36.0)
MCV: 81 fL (ref 80.0–100.0)
Platelets: 180 10*3/uL (ref 150–400)
RBC: 4.63 MIL/uL (ref 3.87–5.11)
RDW: 15.4 % (ref 11.5–15.5)
WBC: 5.2 10*3/uL (ref 4.0–10.5)
nRBC: 0 % (ref 0.0–0.2)

## 2021-07-14 LAB — RESP PANEL BY RT-PCR (FLU A&B, COVID) ARPGX2
Influenza A by PCR: NEGATIVE
Influenza B by PCR: NEGATIVE
SARS Coronavirus 2 by RT PCR: POSITIVE — AB

## 2021-07-14 LAB — PREGNANCY, URINE: Preg Test, Ur: NEGATIVE

## 2021-07-14 LAB — LIPASE, BLOOD: Lipase: 11 U/L (ref 11–51)

## 2021-07-14 MED ORDER — FENTANYL CITRATE PF 50 MCG/ML IJ SOSY
50.0000 ug | PREFILLED_SYRINGE | Freq: Once | INTRAMUSCULAR | Status: AC
  Administered 2021-07-14: 50 ug via INTRAVENOUS
  Filled 2021-07-14: qty 1

## 2021-07-14 MED ORDER — PROMETHAZINE HCL 25 MG RE SUPP: 25.0000 mg | Freq: Four times a day (QID) | RECTAL | 0 refills | Status: DC | PRN

## 2021-07-14 MED ORDER — HYDROMORPHONE HCL 1 MG/ML IJ SOLN
1.0000 mg | Freq: Once | INTRAMUSCULAR | Status: AC
  Administered 2021-07-14: 1 mg via INTRAVENOUS
  Filled 2021-07-14: qty 1

## 2021-07-14 MED ORDER — SODIUM CHLORIDE 0.9 % IV BOLUS
1000.0000 mL | Freq: Once | INTRAVENOUS | Status: AC
  Administered 2021-07-14: 1000 mL via INTRAVENOUS

## 2021-07-14 MED ORDER — METRONIDAZOLE 500 MG PO TABS: 500.0000 mg | ORAL_TABLET | Freq: Two times a day (BID) | ORAL | 0 refills | Status: AC

## 2021-07-14 MED ORDER — METOCLOPRAMIDE HCL 5 MG/ML IJ SOLN
10.0000 mg | Freq: Once | INTRAMUSCULAR | Status: AC
Start: 2021-07-14 — End: 2021-07-14
  Administered 2021-07-14: 10 mg via INTRAVENOUS
  Filled 2021-07-14: qty 2

## 2021-07-14 MED ORDER — ONDANSETRON HCL 4 MG PO TABS: 4.0000 mg | ORAL_TABLET | Freq: Four times a day (QID) | ORAL | 0 refills | Status: AC

## 2021-07-14 MED ORDER — SODIUM CHLORIDE 0.9 % IV SOLN
1.0000 g | Freq: Once | INTRAVENOUS | Status: AC
  Administered 2021-07-14: 1 g via INTRAVENOUS
  Filled 2021-07-14: qty 10

## 2021-07-14 MED ORDER — ONDANSETRON HCL 4 MG/2ML IJ SOLN
4.0000 mg | Freq: Once | INTRAMUSCULAR | Status: AC
  Administered 2021-07-14: 4 mg via INTRAVENOUS
  Filled 2021-07-14: qty 2

## 2021-07-14 NOTE — ED Notes (Signed)
Patient transported to Ultrasound 

## 2021-07-14 NOTE — ED Provider Notes (Signed)
[place] EMERGENCY DEPT Provider Note   CSN: [id number] Arrival date & time: 07/14/21  [id number]     History  Chief Complaint  Patient presents with   Abdominal Pain    Chloe Rodgers is a 31 y.o. female with a past medical history of seizure disorder and chronic PID presenting today with a complaint of pelvic pain and abnormal vaginal discharge.  She was seen in the emergency department and diagnosed with PID on 05/14/2021 and discharged on antibiotics.  She reports that this helped her for a couple days however the symptoms rapidly came back.  She has been having pain and abnormal discharge the entire time however over the past 2 days she has been unable to eat due to severe nausea, vomiting and diarrhea.  Last menstrual period was in early December.  Describes her current discharge as yellow with occasional blood tinges, and says there is an odor "like Clorox".  She endorses chills and had a fever of 103 last night.  She also has had multiple days where she has been unable to urinate.  Also reports she is no longer sexually active because intercourse makes her pain much worse.  She is concerned that this never gets better and believes she needs to be admitted to the hospital.  Home Medications Prior to Admission medications   Medication Sig Start Date End Date Taking? Authorizing Provider  citalopram (CELEXA) 40 MG tablet Take 40 mg by mouth at bedtime.    [provider]  metoCLOPramide (REGLAN) 10 MG tablet Take 1 tablet (10 mg total) by mouth every 6 (six) hours as needed for nausea (nausea/headache). 08/10/20   Mesner, Corene Cornea, MD  metroNIDAZOLE (METROGEL) 0.75 % vaginal gel Place 1 Applicatorful vaginally at bedtime. Apply one applicatorful to vagina at bedtime for 10 days, then twice a week for 6 months. 09/09/20   Aletha Halim, MD  ondansetron (ZOFRAN ODT) 4 MG disintegrating tablet Take 1 tablet (4 mg total) by mouth every 8 (eight) hours as needed for nausea or  vomiting. 03/17/21   Arnaldo Natal, MD  oxcarbazepine (TRILEPTAL) 600 MG tablet Take 600 mg by mouth See admin instructions. Take 1/2 tablet in the morning and 1 tablet every evening for mood.    [provider]  oxyCODONE ER (XTAMPZA ER) 18 MG C12A Take 18 mg by mouth in the morning and at bedtime.    [provider]  Oxycodone HCl 10 MG TABS Take 10 mg by mouth 3 (three) times daily. 04/21/20   [provider]  promethazine (PHENERGAN) 25 MG tablet Take 1 tablet (25 mg total) by mouth every 6 (six) hours as needed for nausea or vomiting. 11/27/18 12/27/19  Chancy Milroy, MD      Allergies    Naproxen and Toradol [ketorolac tromethamine]    Review of Systems   Review of Systems  Constitutional:  Positive for chills and fever.  Gastrointestinal:  Positive for abdominal pain, diarrhea, nausea and vomiting.  Genitourinary:  Positive for difficulty urinating, pelvic pain, vaginal bleeding, vaginal discharge and vaginal pain. Negative for dysuria and hematuria.  Neurological:  Positive for headaches.   Physical Exam Updated Vital Signs BP (!) 148/103 (BP Location: Left Arm)    Pulse 89    Temp 98.8 F (37.1 C)    Resp 16    Ht 5\' 6"  (1.676 m)    Wt 108.9 kg    LMP 07/08/2021    SpO2 100%    BMI 38.74 kg/m  Physical Exam Vitals and nursing note reviewed.  Constitutional:      General: She is not in acute distress.    Appearance: Normal appearance. She is not ill-appearing.  HENT:     Head: Normocephalic and atraumatic.  Eyes:     General: No scleral icterus.    Conjunctiva/sclera: Conjunctivae normal.  Pulmonary:     Effort: Pulmonary effort is normal. No respiratory distress.  Abdominal:     General: Abdomen is flat. There is no distension.     Palpations: Abdomen is soft. There is no mass.     Tenderness: There is abdominal tenderness (All throughout the abdomen however most tender suprapubically and right lower quadrant.).     Hernia: No hernia is  present.  Genitourinary:    Vagina: Vaginal discharge (Milky white) and tenderness present. No bleeding.     Cervix: Cervical motion tenderness present.     Uterus: Tender.      Adnexa:        Right: Tenderness present.        Left: Tenderness present.   Skin:    General: Skin is warm and dry.     Findings: No rash.  Neurological:     Mental Status: She is alert.  Psychiatric:        Mood and Affect: Mood normal.    ED Results / Procedures / Treatments   Labs (all labs ordered are listed, but only abnormal results are displayed) Labs Reviewed  WET PREP, GENITAL - Abnormal; Notable for the following components:      Result Value   Clue Cells Wet Prep HPF POC PRESENT (*)    All other components within normal limits  RESP PANEL BY RT-PCR (FLU A&B, COVID) ARPGX2 - Abnormal; Notable for the following components:   SARS Coronavirus 2 by RT PCR POSITIVE (*)    All other components within normal limits  COMPREHENSIVE METABOLIC PANEL - Abnormal; Notable for the following components:   AST 14 (*)    All other components within normal limits  CBC - Abnormal; Notable for the following components:   Hemoglobin 11.7 (*)    MCH 25.3 (*)    All other components within normal limits  URINALYSIS, ROUTINE W REFLEX MICROSCOPIC - Abnormal; Notable for the following components:   APPearance HAZY (*)    Protein, ur TRACE (*)    Leukocytes,Ua SMALL (*)    All other components within normal limits  LIPASE, BLOOD  PREGNANCY, URINE  GC/CHLAMYDIA PROBE AMP ([place]) NOT AT [facility]    EKG None  Radiology US PELVIC COMPLETE WITH TRANSVAGINAL  Result Date: 07/14/2021 CLINICAL DATA:  Pelvic pain.  History of chronic b.i.d. EXAM: TRANSABDOMINAL AND TRANSVAGINAL ULTRASOUND OF PELVIS TECHNIQUE: Both transabdominal and transvaginal ultrasound examinations of the pelvis were performed. Transabdominal technique was performed for global imaging of the pelvis including uterus, ovaries, adnexal regions,  and pelvic cul-de-sac. It was necessary to proceed with endovaginal exam following the transabdominal exam to visualize the ovaries and endometrium. COMPARISON:  Pelvic ultrasound 05/14/2021 FINDINGS: Uterus Measurements: 9.5 x 4.7 x 6.6 cm = volume: 152.1 mL. No myometrial fibroids. Endometrium Thickness: 8.1 mm.  No focal abnormality visualized. Right ovary Measurements: 3.1 x 2.7 x 2.5 cm = volume: 11.1 mL. No cysts or masses. Left ovary Measurements: 2.3 x 1.6 x 1.4 cm = volume: 2.8 mL. No cysts or masses. Other findings Small amount of free pelvic fluid. IMPRESSION: 1. Unremarkable sonographic appearance of the uterus and ovaries. 2. Small  amount of free pelvic fluid. Electronically Signed   By: Marijo Sanes M.D.   On: 07/14/2021 14:05    Procedures Procedures    Medications Ordered in ED Medications - No data to display  ED Course/ Medical Decision Making/ A&P                           Medical Decision Making Patient presents to the ED for concern of pelvic pain and vaginal discharge., this involves an extensive number of treatment options, and is a complaint that carries with it a high risk of complications and morbidity.  The differential diagnosis includes but is not limited to: STD, PID, Fitzhugh Curtis syndrome, pregnancy, torsion, ovarian cyst, malignancy.   Co morbidities that complicate the patient evaluation  Obesity, chronic PID  Additional history obtained:  Additional history obtained from [facility] health emergency department charts of previous visits.   Lab Tests:  I Ordered, and personally interpreted labs.  The pertinent results include: Positive COVID and clue cells on wet prep   Imaging Studies ordered:  I ordered imaging studies including pelvic ultrasound. I independently visualized and interpreted imaging and agree with the radiologist.  No acute findings.   Medicines ordered and prescription drug management:  I ordered medication including 2 g of Dilaudid  for pain Reevaluation of the patient after these medicines showed that the patient improved however she still rated the pain at a 7.  Given 50 mics of fentanyl.  This improved her pain.   Problem List / ED Course:  COVID and BP positive.  Reevaluation:  After the interventions noted above, I reevaluated the patient and found that they have :improved   Dispostion:  After consideration of the diagnostic results and the patients response to treatment, I feel that the patent would benefit from follow-up with OB/GYN about her chronic pelvic pain.  I will send antibiotics for bacterial vaginosis and discussed proper COVID over-the-counter care.         Final Clinical Impression(s) / ED Diagnoses Final diagnoses:  Pelvic pain  COVID-19  Bacterial vaginosis    Rx / DC Orders Results and diagnoses were explained to the patient. Return precautions discussed in full. Patient had no additional questions and expressed complete understanding.   This chart was dictated using voice recognition software.  Despite best efforts to proofread,  errors can occur which can change the documentation meaning.      Darliss Ridgel 07/14/21 [place], [place], MD 07/15/21 [phone]

## 2021-07-14 NOTE — Discharge Instructions (Addendum)
Your ultrasound today is normal.  No signs of fibroids or structural infection.  Your swabs did reveal bacterial vaginosis.  You also have COVID-19.  At this time I believe it is crucial for you to follow-up with OB/GYN.  I have attached a provider to these papers for you to use if needed.  In the meantime you may also follow-up with your primary care provider to see if she is able to assist in your chronic symptoms.  I have sent medications for nausea to your pharmacy and you should continue to alternate ibuprofen and Tylenol around-the-clock.

## 2021-07-14 NOTE — ED Notes (Signed)
EMT-P provided AVS using Teachback Method. Patient verbalizes understanding of Discharge Instructions. Opportunity for Questioning and Answers were provided by EMT-P. Patient Discharged from ED.  ? ?

## 2021-07-14 NOTE — ED Notes (Signed)
Patient called x 2 for blood draw with not answer.

## 2021-07-14 NOTE — ED Triage Notes (Signed)
Low abd pain this morning. States she thinks it PID.

## 2021-07-15 LAB — GC/CHLAMYDIA PROBE AMP (~~LOC~~) NOT AT ARMC
Chlamydia: NEGATIVE
Comment: NEGATIVE
Comment: NORMAL
Neisseria Gonorrhea: NEGATIVE

## 2021-09-04 ENCOUNTER — Other Ambulatory Visit: Payer: Self-pay | Admitting: Obstetrics and Gynecology

## 2021-09-06 ENCOUNTER — Other Ambulatory Visit: Payer: Self-pay | Admitting: Obstetrics and Gynecology

## 2021-09-08 ENCOUNTER — Other Ambulatory Visit: Payer: Self-pay | Admitting: Obstetrics and Gynecology

## 2021-09-10 ENCOUNTER — Other Ambulatory Visit: Payer: Self-pay | Admitting: Obstetrics and Gynecology

## 2021-09-24 ENCOUNTER — Encounter (HOSPITAL_BASED_OUTPATIENT_CLINIC_OR_DEPARTMENT_OTHER): Payer: Self-pay

## 2021-09-24 ENCOUNTER — Emergency Department (HOSPITAL_BASED_OUTPATIENT_CLINIC_OR_DEPARTMENT_OTHER): Payer: Medicaid Other

## 2021-09-24 ENCOUNTER — Other Ambulatory Visit (HOSPITAL_BASED_OUTPATIENT_CLINIC_OR_DEPARTMENT_OTHER): Payer: Self-pay

## 2021-09-24 ENCOUNTER — Emergency Department (HOSPITAL_BASED_OUTPATIENT_CLINIC_OR_DEPARTMENT_OTHER)
Admission: EM | Admit: 2021-09-24 | Discharge: 2021-09-24 | Disposition: A | Discharge status: Home / Self Care | Payer: Medicaid Other | Attending: Emergency Medicine | Admitting: Emergency Medicine

## 2021-09-24 ENCOUNTER — Other Ambulatory Visit: Payer: Self-pay

## 2021-09-24 DIAGNOSIS — O23591 Infection of other part of genital tract in pregnancy, first trimester: Secondary | ICD-10-CM | POA: Diagnosis not present

## 2021-09-24 DIAGNOSIS — O26891 Other specified pregnancy related conditions, first trimester: Secondary | ICD-10-CM | POA: Diagnosis present

## 2021-09-24 DIAGNOSIS — Z3A01 Less than 8 weeks gestation of pregnancy: Secondary | ICD-10-CM | POA: Diagnosis not present

## 2021-09-24 DIAGNOSIS — N9489 Other specified conditions associated with female genital organs and menstrual cycle: Secondary | ICD-10-CM | POA: Diagnosis not present

## 2021-09-24 LAB — URINALYSIS, ROUTINE W REFLEX MICROSCOPIC
Bilirubin Urine: NEGATIVE
Glucose, UA: NEGATIVE mg/dL
Hgb urine dipstick: NEGATIVE
Ketones, ur: NEGATIVE mg/dL
Leukocytes,Ua: NEGATIVE
Nitrite: NEGATIVE
Protein, ur: NEGATIVE mg/dL
Specific Gravity, Urine: 1.019 (ref 1.005–1.030)
pH: 6.5 (ref 5.0–8.0)

## 2021-09-24 LAB — HIV ANTIBODY (ROUTINE TESTING W REFLEX): HIV Screen 4th Generation wRfx: NONREACTIVE

## 2021-09-24 LAB — WET PREP, GENITAL
Clue Cells Wet Prep HPF POC: NONE SEEN
Sperm: NONE SEEN
Trich, Wet Prep: NONE SEEN
WBC, Wet Prep HPF POC: <10 (ref <10)
Yeast Wet Prep HPF POC: NONE SEEN

## 2021-09-24 LAB — COMPREHENSIVE METABOLIC PANEL
ALT: 15 U/L (ref 0–44)
AST: 16 U/L (ref 15–41)
Albumin: 4.4 g/dL (ref 3.5–5.0)
Alkaline Phosphatase: 53 U/L (ref 38–126)
Anion gap: 9 (ref 5–15)
BUN: 13 mg/dL (ref 6–20)
CO2: 22 mmol/L (ref 22–32)
Calcium: 10.1 mg/dL (ref 8.9–10.3)
Chloride: 103 mmol/L (ref 98–111)
Creatinine, Ser: 0.85 mg/dL (ref 0.44–1.00)
GFR, Estimated: >60 mL/min (ref >60)
Glucose, Bld: 93 mg/dL (ref 70–99)
Potassium: 3.8 mmol/L (ref 3.5–5.1)
Sodium: 134 mmol/L — ABNORMAL LOW (ref 135–145)
Total Bilirubin: 0.3 mg/dL (ref 0.3–1.2)
Total Protein: 8.7 g/dL — ABNORMAL HIGH (ref 6.5–8.1)

## 2021-09-24 LAB — CBC
HCT: 37.2 % (ref 36.0–46.0)
Hemoglobin: 11.9 g/dL — ABNORMAL LOW (ref 12.0–15.0)
MCH: 26.3 pg (ref 26.0–34.0)
MCHC: 32 g/dL (ref 30.0–36.0)
MCV: 82.1 fL (ref 80.0–100.0)
Platelets: 177 10*3/uL (ref 150–400)
RBC: 4.53 MIL/uL (ref 3.87–5.11)
RDW: 15.9 % — ABNORMAL HIGH (ref 11.5–15.5)
WBC: 6 10*3/uL (ref 4.0–10.5)
nRBC: 0 % (ref 0.0–0.2)

## 2021-09-24 LAB — HCG, QUANTITATIVE, PREGNANCY: hCG, Beta Chain, Quant, S: 21494 m[IU]/mL — ABNORMAL HIGH (ref <5)

## 2021-09-24 LAB — RPR: RPR Ser Ql: NONREACTIVE

## 2021-09-24 LAB — HCG, SERUM, QUALITATIVE: Preg, Serum: POSITIVE — AB

## 2021-09-24 LAB — LIPASE, BLOOD: Lipase: 20 U/L (ref 11–51)

## 2021-09-24 MED ORDER — METRONIDAZOLE 0.75 % VA GEL
1.0000 | Freq: Every day | VAGINAL | 0 refills | Status: DC
  Filled 2021-09-24: qty 70, 7d supply, fill #0

## 2021-09-24 MED ORDER — ONDANSETRON HCL 4 MG/2ML IJ SOLN
4.0000 mg | Freq: Once | INTRAMUSCULAR | Status: AC
  Administered 2021-09-24: 4 mg via INTRAVENOUS
  Filled 2021-09-24: qty 2

## 2021-09-24 MED ORDER — SODIUM CHLORIDE 0.9 % IV BOLUS
1000.0000 mL | Freq: Once | INTRAVENOUS | Status: AC
  Administered 2021-09-24: 1000 mL via INTRAVENOUS

## 2021-09-24 MED ORDER — FENTANYL CITRATE PF 50 MCG/ML IJ SOSY
50.0000 ug | PREFILLED_SYRINGE | Freq: Once | INTRAMUSCULAR | Status: AC
  Administered 2021-09-24: 50 ug via INTRAVENOUS
  Filled 2021-09-24: qty 1

## 2021-09-24 NOTE — ED Notes (Signed)
Pt asked to provide a urine sample. Pt stated, "I cannot. It hurts. I haven't peed in two days."  ?

## 2021-09-24 NOTE — ED Notes (Signed)
Patient verbalizes understanding of discharge instructions. Opportunity for questioning and answers were provided. Armband removed by staff, pt discharged from ED. Pt. ambulatory and discharged home.  

## 2021-09-24 NOTE — ED Triage Notes (Signed)
Onset of 2 days lower abdominal that radiates to flanks bilaterally.   Nausea and not eating.  No vomiting.  Painful to urinate.  States has not urinated ?

## 2021-09-24 NOTE — ED Provider Notes (Signed)
?[facility]PT ?Provider Note ? ? ?CSN: [id number] ?Arrival date & time: 09/24/21  0747 ? ?  ? ?History ? ?Chief Complaint  ?Patient presents with  ? Abdominal Pain  ? ? ?Chloe Rodgers is a 31 y.o. female. ? ?Patient is a 31 year old female who presents with lower abdominal pain.  She says it started 2 days ago.  She is also had some associated vaginal discharge.  She reports some fever and chills as well.  She has had some nausea but no vomiting.  She says it burns down in her urinary/vaginal area but no actual burning on urination.  She says she has decreased urination because she has not really been eating and drinking much.  She has been seen previously for recurrent bacterial vaginosis.  Of note, on chart review, her GC and chlamydia test have been negative.  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  She says this feels like the same symptoms she has had in the past.  She has not followed up with her OB/GYN which is [facility]. ? ? ?  ? ?Home Medications ?Prior to Admission medications   ?Medication Sig Start Date End Date Taking? Authorizing Provider  ?metroNIDAZOLE (METROGEL) 0.75 % vaginal gel Place 1 Applicatorful vaginally at bedtime. 09/24/21  Yes Rolan Bucco, MD  ?oxyCODONE ER (XTAMPZA ER) 18 MG C12A Take 18 mg by mouth in the morning and at bedtime.   Yes [provider]  ?Oxycodone HCl 10 MG TABS Take 10 mg by mouth 3 (three) times daily. 04/21/20  Yes [provider]  ?metoCLOPramide (REGLAN) 10 MG tablet Take 1 tablet (10 mg total) by mouth every 6 (six) hours as needed for nausea (nausea/headache). ?Patient not taking: Reported on 09/24/2021 08/10/20   Mesner, Barbara Cower, MD  ?ondansetron (ZOFRAN ODT) 4 MG disintegrating tablet Take 1 tablet (4 mg total) by mouth every 8 (eight) hours as needed for nausea or vomiting. ?Patient not taking: Reported on 09/24/2021 03/17/21   Koleen Distance, MD  ?promethazine  (PHENERGAN) 25 MG suppository Place 1 suppository (25 mg total) rectally every 6 (six) hours as needed for nausea or vomiting. ?Patient not taking: Reported on 09/24/2021 07/14/21   Redwine, Madison A, PA-C  ?   ? ?Allergies    ?Naproxen and Toradol [ketorolac tromethamine]   ? ?Review of Systems   ?Review of Systems  ?Constitutional:  Positive for chills and fever. Negative for diaphoresis and fatigue.  ?HENT:  Negative for congestion, rhinorrhea and sneezing.   ?Eyes: Negative.   ?Respiratory:  Negative for cough, chest tightness and shortness of breath.   ?Cardiovascular:  Negative for chest pain and leg swelling.  ?Gastrointestinal:  Positive for abdominal pain and nausea. Negative for blood in stool, diarrhea and vomiting.  ?Genitourinary:  Positive for vaginal discharge. Negative for difficulty urinating, flank pain, frequency, hematuria, vaginal bleeding and vaginal pain.  ?Musculoskeletal:  Negative for arthralgias and back pain.  ?Skin:  Negative for rash.  ?Neurological:  Negative for dizziness, speech difficulty, weakness, numbness and headaches.  ? ?Physical Exam ?Updated Vital Signs ?BP 114/68 (BP Location: Right Arm)   Pulse 79   Temp 98.1 ?F (36.7 ?C) (Oral)   Resp 18   Ht 5\' 6"  (1.676 m)   Wt 108.9 kg   LMP 09/08/2021 (Approximate)   SpO2 100%   BMI 38.75 kg/m?  ?Physical Exam ?Constitutional:   ?   Appearance: She is well-developed.  ?HENT:  ?  Head: Normocephalic and atraumatic.  ?Eyes:  ?   Pupils: Pupils are equal, round, and reactive to light.  ?Cardiovascular:  ?   Rate and Rhythm: Normal rate and regular rhythm.  ?   Heart sounds: Normal heart sounds.  ?Pulmonary:  ?   Effort: Pulmonary effort is normal. No respiratory distress.  ?   Breath sounds: Normal breath sounds. No wheezing or rales.  ?Chest:  ?   Chest wall: No tenderness.  ?Abdominal:  ?   General: Bowel sounds are normal.  ?   Palpations: Abdomen is soft.  ?   Tenderness: There is abdominal tenderness in the suprapubic area.  There is no guarding or rebound.  ?Genitourinary: ?   Vagina: Vaginal discharge present. No bleeding.  ?   Comments: Positive tenderness around her cervix, no adnexal tenderness ?Musculoskeletal:     ?   General: Normal range of motion.  ?   Cervical back: Normal range of motion and neck supple.  ?Lymphadenopathy:  ?   Cervical: No cervical adenopathy.  ?Skin: ?   General: Skin is warm and dry.  ?   Findings: No rash.  ?Neurological:  ?   Mental Status: She is alert and oriented to person, place, and time.  ? ? ?ED Results / Procedures / Treatments   ?Labs ?(all labs ordered are listed, but only abnormal results are displayed) ?Labs Reviewed  ?COMPREHENSIVE METABOLIC PANEL - Abnormal; Notable for the following components:  ?    Result Value  ? Sodium 134 (*)   ? Total Protein 8.7 (*)   ? All other components within normal limits  ?CBC - Abnormal; Notable for the following components:  ? Hemoglobin 11.9 (*)   ? RDW 15.9 (*)   ? All other components within normal limits  ?HCG, SERUM, QUALITATIVE - Abnormal; Notable for the following components:  ? Preg, Serum POSITIVE (*)   ? All other components within normal limits  ?HCG, QUANTITATIVE, PREGNANCY - Abnormal; Notable for the following components:  ? hCG, Beta Chain, Quant, S 21,494 (*)   ? All other components within normal limits  ?WET PREP, GENITAL  ?LIPASE, BLOOD  ?URINALYSIS, ROUTINE W REFLEX MICROSCOPIC  ?RPR  ?HIV ANTIBODY (ROUTINE TESTING W REFLEX)  ?GC/CHLAMYDIA PROBE AMP ([place]) NOT AT [facility]  ? ? ?EKG ?None ? ?Radiology ?US OB LESS THAN 14 WEEKS WITH OB TRANSVAGINAL ? ?Result Date: 09/24/2021 ?CLINICAL DATA:  Lower abdominal pain for 2 days. Unknown last menstrual. EXAM: OBSTETRIC <14 WK [place] AND TRANSVAGINAL OB US TECHNIQUE: Both transabdominal and transvaginal ultrasound examinations were performed for complete evaluation of the gestation as well as the maternal uterus, adnexal regions, and pelvic cul-de-sac. Transvaginal technique was performed to assess  early pregnancy. COMPARISON:  07/14/2021 pelvic ultrasound FINDINGS: Intrauterine gestational sac: Single Yolk sac:  Present Embryo:  Present Cardiac Activity: Present Heart Rate: Too small to measure. CRL:  4 mm   6 w   1 d                  [place] EDC: 05/19/2022 Subchorionic hemorrhage:  None visualized. Maternal uterus/adnexae: Right ovarian corpus luteal cyst of 2.0 cm. Trace free pelvic fluid is likely physiologic. IMPRESSION: Intrauterine pregnancy of 6 weeks 1 day. Cardiac activity identified. Right ovarian corpus luteal cyst. Electronically Signed   By: Jeronimo Greaves M.D.   On: 09/24/2021 10:45   ? ?Procedures ?Procedures  ? ? ?Medications Ordered in ED ?Medications  ?sodium chloride 0.9 % bolus 1,000 mL (  0 mLs Intravenous Stopped 09/24/21 1030)  ?ondansetron (ZOFRAN) injection 4 mg (4 mg Intravenous Given 09/24/21 0830)  ?fentaNYL (SUBLIMAZE) injection 50 mcg (50 mcg Intravenous Given 09/24/21 0902)  ? ? ?ED Course/ Medical Decision Making/ A&P ?  ?                        ?Medical Decision Making ?Amount and/or Complexity of Data Reviewed ?External Data Reviewed: labs and notes. ?Labs: ordered. Decision-making details documented in ED Course. ?Radiology: ordered. Decision-making details documented in ED Course. ? ?Risk ?Prescription drug management. ?Parenteral controlled substances. ? ? ?Patient is a 31 year old female who presents with lower abdominal pain.  She has had the similar symptoms in the past with BV infections.  She had a pelvic exam which did show some discharge.  Her wet prep is positive for clue cells.  All of her GC and Chlamydia tests in the past have been negative although it was done today.  However given this, I will presumptively treat her.  She did have a positive pregnancy test.  Given this and the ongoing abdominal pain, pelvic ultrasound was performed which shows intrauterine pregnancy.  Fetal heart tones are present.  She symptomatically better.  Was discharged home in good condition.   Was started on Flagyl.  She prefers the vaginal gel.  She was advised to start prenatal vitamins.  She will make an appointment to follow-up with the women's health clinic.  Return precautions were given. ? ?Final C

## 2021-09-24 NOTE — ED Notes (Signed)
Pelvic exam done by Dr. Belfi and Tenelle Andreason - EMT assisted. 

## 2021-09-24 NOTE — ED Notes (Signed)
Patient transported to Ultrasound 

## 2021-09-25 LAB — GC/CHLAMYDIA PROBE AMP (~~LOC~~) NOT AT ARMC
Chlamydia: NEGATIVE
Comment: NEGATIVE
Comment: NORMAL
Neisseria Gonorrhea: NEGATIVE

## 2021-09-26 ENCOUNTER — Inpatient Hospital Stay (HOSPITAL_COMMUNITY)
Admission: AD | Admit: 2021-09-26 | Discharge: 2021-09-26 | Disposition: A | Discharge status: Home / Self Care | Payer: Medicaid Other | Attending: Family Medicine | Admitting: Family Medicine

## 2021-09-26 ENCOUNTER — Encounter (HOSPITAL_COMMUNITY): Payer: Self-pay | Admitting: Family Medicine

## 2021-09-26 ENCOUNTER — Other Ambulatory Visit: Payer: Self-pay

## 2021-09-26 DIAGNOSIS — Z3A01 Less than 8 weeks gestation of pregnancy: Secondary | ICD-10-CM | POA: Diagnosis not present

## 2021-09-26 DIAGNOSIS — R110 Nausea: Secondary | ICD-10-CM | POA: Diagnosis not present

## 2021-09-26 DIAGNOSIS — O99611 Diseases of the digestive system complicating pregnancy, first trimester: Secondary | ICD-10-CM | POA: Diagnosis not present

## 2021-09-26 DIAGNOSIS — O23591 Infection of other part of genital tract in pregnancy, first trimester: Secondary | ICD-10-CM | POA: Insufficient documentation

## 2021-09-26 DIAGNOSIS — K5900 Constipation, unspecified: Secondary | ICD-10-CM

## 2021-09-26 DIAGNOSIS — O26891 Other specified pregnancy related conditions, first trimester: Secondary | ICD-10-CM | POA: Diagnosis present

## 2021-09-26 DIAGNOSIS — B9689 Other specified bacterial agents as the cause of diseases classified elsewhere: Secondary | ICD-10-CM | POA: Diagnosis not present

## 2021-09-26 LAB — CBC WITH DIFFERENTIAL/PLATELET
Abs Immature Granulocytes: 0.02 10*3/uL (ref 0.00–0.07)
Basophils Absolute: 0 10*3/uL (ref 0.0–0.1)
Basophils Relative: 0 %
Eosinophils Absolute: 0 10*3/uL (ref 0.0–0.5)
Eosinophils Relative: 1 %
HCT: 35.2 % — ABNORMAL LOW (ref 36.0–46.0)
Hemoglobin: 11.3 g/dL — ABNORMAL LOW (ref 12.0–15.0)
Immature Granulocytes: 0 %
Lymphocytes Relative: 19 %
Lymphs Abs: 1.2 10*3/uL (ref 0.7–4.0)
MCH: 26.6 pg (ref 26.0–34.0)
MCHC: 32.1 g/dL (ref 30.0–36.0)
MCV: 82.8 fL (ref 80.0–100.0)
Monocytes Absolute: 0.6 10*3/uL (ref 0.1–1.0)
Monocytes Relative: 10 %
Neutro Abs: 4.2 10*3/uL (ref 1.7–7.7)
Neutrophils Relative %: 70 %
Platelets: 160 10*3/uL (ref 150–400)
RBC: 4.25 MIL/uL (ref 3.87–5.11)
RDW: 15.6 % — ABNORMAL HIGH (ref 11.5–15.5)
WBC: 6.1 10*3/uL (ref 4.0–10.5)
nRBC: 0 % (ref 0.0–0.2)

## 2021-09-26 LAB — URINALYSIS, ROUTINE W REFLEX MICROSCOPIC
Bilirubin Urine: NEGATIVE
Glucose, UA: NEGATIVE mg/dL
Hgb urine dipstick: NEGATIVE
Ketones, ur: NEGATIVE mg/dL
Leukocytes,Ua: NEGATIVE
Nitrite: NEGATIVE
Protein, ur: NEGATIVE mg/dL
Specific Gravity, Urine: 1.021 (ref 1.005–1.030)
pH: 7 (ref 5.0–8.0)

## 2021-09-26 MED ORDER — CYCLOBENZAPRINE HCL 5 MG PO TABS
5.0000 mg | ORAL_TABLET | Freq: Once | ORAL | Status: AC
  Administered 2021-09-26: 5 mg via ORAL
  Filled 2021-09-26: qty 1

## 2021-09-26 MED ORDER — METOCLOPRAMIDE HCL 10 MG PO TABS
10.0000 mg | ORAL_TABLET | Freq: Once | ORAL | Status: AC
  Administered 2021-09-26: 10 mg via ORAL
  Filled 2021-09-26: qty 1

## 2021-09-26 MED ORDER — METOCLOPRAMIDE HCL 10 MG PO TABS: 10.0000 mg | ORAL_TABLET | Freq: Four times a day (QID) | ORAL | 0 refills | Status: DC

## 2021-09-26 NOTE — MAU Note (Signed)
Chloe Rodgers is a 31 y.o. at [redacted]w[redacted]d here in MAU reporting: sharp left sided pain for the past 3-4 days. States she was told she has a cyst on one of her ovaries. No BM in 3 days. Also states she has been real sick, no emesis.  ? ?LMP: 08/16/21 ? ?Onset of complaint: ongoing ? ?Pain score: 8/10 ? ?Vitals:  ? 09/26/21 1134  ?BP: 123/70  ?Pulse: 99  ?Resp: 16  ?Temp: 97.9 ?F (36.6 ?C)  ?SpO2: 100%  ?   ?Lab orders placed from triage: UA ?

## 2021-09-26 NOTE — MAU Provider Note (Signed)
?History  ?  ? ?CSN: [id number] ? ?Arrival date and time: 09/26/21 1121 ? ?None  ?  ?Chief Complaint  ?Patient presents with  ? Abdominal Pain  ? ?HPI ?Chloe Rodgers is a 31 y.o. [phone] at [redacted]w[redacted]d by LMP who presents to MAU for sharp left sided abdominal pain. Pain started several days ago and patient describes as a constant sharp, needle prick sensation. She reports she has not had a BM in approximately 1 week. Unsure if pain is related to constipation or "having ovarian cyst". She is reporting nausea, but denies vomiting. Has not eaten or had much to drink due to nausea. 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 She also reports she had a fever of 103 yesterday, but has not had any more fevers since. She did not have to take anything for her fever. She denies sick contacts, cough, congestion, sore throat, or recent sick contacts. She is currently being treated for BV, which she is using metrogel to treat. She denies vaginal bleeding. She was seen at [facility] on 09/24/21 and had a confirmed IUP with FHR present.  ? ?She is scheduled for prenatal care at [facility].  ? ?OB History   ? ? Gravida  ?7  ? Para  ?3  ? Term  ?3  ? Preterm  ?0  ? AB  ?2  ? Living  ?3  ?  ? ? SAB  ?0  ? IAB  ?1  ? Ectopic  ?0  ? Multiple  ?0  ? Live Births  ?3  ?   ?  ?  ? ? ?Past Medical History:  ?Diagnosis Date  ? Anemia   ? Anxiety   ? Chlamydia   ? Complication of anesthesia   ? ??, seizure with wisdom teeth  ? Depression   ? not on meds, sees a therapist  ? Family history of breast cancer   ? Family history of colon cancer   ? Infection   ? UTI  ? Kidney stone   ? kidney stones  ? Migraine   ? PID (acute pelvic inflammatory disease) 2014  ? PONV (postoperative nausea and vomiting)   ? Psoriasis   ? Seizures ([place])   ? July 2013 - [place]  ? ? ?Past Surgical History:  ?Procedure Laterality Date  ? CESAREAN SECTION    ? CESAREAN SECTION  06/06/2012  ? Procedure: CESAREAN SECTION;  Surgeon:  Woodroe Mode, MD;  Location: [facility];  Service: Obstetrics;  Laterality: N/A;  ? CESAREAN SECTION N/A 03/30/2017  ? Procedure: REPEAT CESAREAN SECTION;  Surgeon: Florian Buff, MD;  Location: [place];  Service: Obstetrics;  Laterality: N/A;  ? SKIN GRAFT    ? off abd, onto arm  ? WISDOM TOOTH EXTRACTION    ? ? ?Family History  ?Problem Relation Age of Onset  ? Hypertension Mother   ? Diabetes Mother   ? Stroke Mother   ? Seizures Mother   ? Breast cancer Mother   ?     dx in her early 55s  ? Asthma Daughter   ? Hypertension Maternal Grandmother   ? Diabetes Maternal Grandmother   ? Cancer Maternal Grandmother   ?     BONE CANCER  ? Breast cancer Maternal Grandmother   ?     dx in her early 49s  ? Colon cancer Maternal Grandmother   ?     dx in her 4s  ?  Dementia Paternal Grandmother   ? Other Neg Hx   ? ? ?Social History  ? ?Tobacco Use  ? Smoking status: Never  ? Smokeless tobacco: Never  ?Vaping Use  ? Vaping Use: Never used  ?Substance Use Topics  ? Alcohol use: Not Currently  ?  Comment: occ  ? Drug use: No  ? ? ?Allergies:  ?Allergies  ?Allergen Reactions  ? Naproxen Itching  ? Toradol [Ketorolac Tromethamine] Itching  ? ? ?Medications Prior to Admission  ?Medication Sig Dispense Refill Last Dose  ? metoCLOPramide (REGLAN) 10 MG tablet Take 1 tablet (10 mg total) by mouth every 6 (six) hours as needed for nausea (nausea/headache). (Patient not taking: Reported on 09/24/2021) 30 tablet 0   ? metroNIDAZOLE (METROGEL) 0.75 % vaginal gel Place 1 Applicatorful vaginally at bedtime. 70 g 0   ? ondansetron (ZOFRAN ODT) 4 MG disintegrating tablet Take 1 tablet (4 mg total) by mouth every 8 (eight) hours as needed for nausea or vomiting. (Patient not taking: Reported on 09/24/2021) 20 tablet 0   ? oxyCODONE ER (XTAMPZA ER) 18 MG C12A Take 18 mg by mouth in the morning and at bedtime.     ? Oxycodone HCl 10 MG TABS Take 10 mg by mouth 3 (three) times daily.     ? promethazine (PHENERGAN) 25 MG suppository  Place 1 suppository (25 mg total) rectally every 6 (six) hours as needed for nausea or vomiting. (Patient not taking: Reported on 09/24/2021) 12 each 0   ? ? ?Review of Systems  ?Constitutional: Negative.   ?Respiratory: Negative.    ?Cardiovascular: Negative.   ?Gastrointestinal:  Positive for abdominal pain, constipation and nausea. Negative for vomiting.  ?Genitourinary: Negative.   ?Musculoskeletal: Negative.   ?Neurological: Negative.   ? ?Physical Exam  ? ?Blood pressure 120/68, pulse 85, temperature 97.9 ?F (36.6 ?C), temperature source Oral, resp. rate 17, height 5\' 6"  (1.676 m), weight 107.9 kg, last menstrual period 08/16/2021, SpO2 100 %, unknown if currently breastfeeding. ? ?Physical Exam ?Vitals and nursing note reviewed.  ?Constitutional:   ?   General: She is not in acute distress. ?   Appearance: She is obese.  ?Eyes:  ?   Extraocular Movements: Extraocular movements intact.  ?   Pupils: Pupils are equal, round, and reactive to light.  ?Cardiovascular:  ?   Rate and Rhythm: Normal rate.  ?Pulmonary:  ?   Effort: Pulmonary effort is normal.  ?Abdominal:  ?   Palpations: Abdomen is soft.  ?   Tenderness: There is abdominal tenderness in the left lower quadrant. There is no guarding.  ?Skin: ?   General: Skin is warm and dry.  ?Neurological:  ?   General: No focal deficit present.  ?   Mental Status: She is alert and oriented to person, place, and time.  ?Psychiatric:     ?   Mood and Affect: Mood normal.     ?   Behavior: Behavior normal.  ? ?MAU Course  ?Procedures ?UA ?CBC with diff ?Soaps suds enema  ? ?MDM ?VSS, patient afebrile. UA and CBC unremarkable. No signs of infection.  ?Patient given soap suds enema with some relief. Was able to pass small amount of stool. Reports despite Reglan being given, nausea has not improved. She has not had any episodes of vomiting. She is requesting something to relieve headache.  ?PO flexeril given ?On reassessment, patient requesting to eat some crackers and be  discharged home. Patient tolerated crackers.  ? ?Assessment and Plan  ?  [redacted] weeks gestation of pregnancy ?Constipation ? ?- Discharge home in stable condition ?- Strict return precautions reviewed  ?- Rx for nausea meds sent to pharmacy ?- Info on Miralax cleanout provided. Encouraged high fiber diet, increase water intake. Stool softeners prn. ?- Return to MAU sooner or as needed ?- Keep OB appointment as scheduled ? ? ? ?Renee Harder, CNM ?09/26/2021, 3:40 PM  ?

## 2021-09-26 NOTE — Discharge Instructions (Signed)
You have constipation which is hard stools that are difficult to pass. It is important to have regular bowel movements every 1-3 days that are soft and easy to pass. Hard stools increase your risk of hemorrhoids and are very uncomfortable.   To prevent constipation you can increase the amount of fiber in your diet. Examples of foods with fiber are leafy greens, whole grain breads, oatmeal and other grains.  It is also important to drink at least eight 8oz glass of water everyday.   If you have not has a bowel movement in 4-5 days you made need to clean out your bowel.  This will have establish normal movement through your bowel.    Miralax Clean out Take 8 capfuls of miralax in 64 oz of gatorade. You can use any fluid that appeals to you (gatorade, water, juice) Continue to drink at least eight 8 oz glasses of water throughout the day You can repeat with another 8 capfuls of miralax in 64 oz of gatorade if you are not having a large amount of stools You will need to be at home and close to a bathroom for about 8 hours when you do the above as you may need to go to the bathroom frequently.   After you are cleaned out: - Start Colace100mg twice daily - Start Miralax once daily - Start a daily fiber supplement like metamucil or citrucel - You can safely use enemas in pregnancy  - if you are having diarrhea you can reduce to Colace once a day or miralax every other day or a 1/2 capful daily.                    Safe Medications in Pregnancy    Acne: Benzoyl Peroxide Salicylic Acid  Backache/Headache: Tylenol: 2 regular strength every 4 hours OR              2 Extra strength every 6 hours  Colds/Coughs/Allergies: Benadryl (alcohol free) 25 mg every 6 hours as needed Breath right strips Claritin Cepacol throat lozenges Chloraseptic throat spray Cold-Eeze- up to three times per day Cough drops, alcohol free Flonase (by prescription only) Guaifenesin Mucinex Robitussin DM (plain only,  alcohol free) Saline nasal spray/drops Sudafed (pseudoephedrine) & Actifed ** use only after [redacted] weeks gestation and if you do not have high blood pressure Tylenol Vicks Vaporub Zinc lozenges Zyrtec   Constipation: Colace Ducolax suppositories Fleet enema Glycerin suppositories Metamucil Milk of magnesia Miralax Senokot Smooth move tea  Diarrhea: Kaopectate Imodium A-D  *NO pepto Bismol  Hemorrhoids: Anusol Anusol HC Preparation H Tucks  Indigestion: Tums Maalox Mylanta Zantac  Pepcid  Insomnia: Benadryl (alcohol free) 25mg every 6 hours as needed Tylenol PM Unisom, no Gelcaps  Leg Cramps: Tums MagGel  Nausea/Vomiting:  Bonine Dramamine Emetrol Ginger extract Sea bands Meclizine  Nausea medication to take during pregnancy:  Unisom (doxylamine succinate 25 mg tablets) Take one tablet daily at bedtime. If symptoms are not adequately controlled, the dose can be increased to a maximum recommended dose of two tablets daily (1/2 tablet in the morning, 1/2 tablet mid-afternoon and one at bedtime). Vitamin B6 100mg tablets. Take one tablet twice a day (up to 200 mg per day).  Skin Rashes: Aveeno products Benadryl cream or 25mg every 6 hours as needed Calamine Lotion 1% cortisone cream  Yeast infection: Gyne-lotrimin 7 Monistat 7   **If taking multiple medications, please check labels to avoid duplicating the same active ingredients **take medication as directed on   the label ** Do not exceed 4000 mg of tylenol in 24 hours **Do not take medications that contain aspirin or ibuprofen    

## 2021-09-29 ENCOUNTER — Encounter (HOSPITAL_COMMUNITY): Payer: Self-pay

## 2021-09-29 ENCOUNTER — Other Ambulatory Visit: Payer: Self-pay

## 2021-09-29 ENCOUNTER — Inpatient Hospital Stay (HOSPITAL_COMMUNITY)
Admission: EM | Admit: 2021-09-29 | Discharge: 2021-09-29 | Disposition: A | Discharge status: Home / Self Care | Payer: Medicaid Other | Attending: Obstetrics & Gynecology | Admitting: Obstetrics & Gynecology

## 2021-09-29 DIAGNOSIS — O26891 Other specified pregnancy related conditions, first trimester: Secondary | ICD-10-CM | POA: Insufficient documentation

## 2021-09-29 DIAGNOSIS — Y92481 Parking lot as the place of occurrence of the external cause: Secondary | ICD-10-CM | POA: Insufficient documentation

## 2021-09-29 DIAGNOSIS — Z3A01 Less than 8 weeks gestation of pregnancy: Secondary | ICD-10-CM | POA: Insufficient documentation

## 2021-09-29 DIAGNOSIS — R109 Unspecified abdominal pain: Secondary | ICD-10-CM | POA: Diagnosis not present

## 2021-09-29 DIAGNOSIS — Z98891 History of uterine scar from previous surgery: Secondary | ICD-10-CM | POA: Diagnosis not present

## 2021-09-29 DIAGNOSIS — O99211 Obesity complicating pregnancy, first trimester: Secondary | ICD-10-CM | POA: Insufficient documentation

## 2021-09-29 DIAGNOSIS — Z3490 Encounter for supervision of normal pregnancy, unspecified, unspecified trimester: Secondary | ICD-10-CM

## 2021-09-29 LAB — CBC WITH DIFFERENTIAL/PLATELET
Abs Immature Granulocytes: 0.01 10*3/uL (ref 0.00–0.07)
Basophils Absolute: 0 10*3/uL (ref 0.0–0.1)
Basophils Relative: 0 %
Eosinophils Absolute: 0 10*3/uL (ref 0.0–0.5)
Eosinophils Relative: 0 %
HCT: 37.5 % (ref 36.0–46.0)
Hemoglobin: 11.9 g/dL — ABNORMAL LOW (ref 12.0–15.0)
Immature Granulocytes: 0 %
Lymphocytes Relative: 12 %
Lymphs Abs: 0.8 10*3/uL (ref 0.7–4.0)
MCH: 26.6 pg (ref 26.0–34.0)
MCHC: 31.7 g/dL (ref 30.0–36.0)
MCV: 83.7 fL (ref 80.0–100.0)
Monocytes Absolute: 0.6 10*3/uL (ref 0.1–1.0)
Monocytes Relative: 9 %
Neutro Abs: 5.4 10*3/uL (ref 1.7–7.7)
Neutrophils Relative %: 79 %
Platelets: 184 10*3/uL (ref 150–400)
RBC: 4.48 MIL/uL (ref 3.87–5.11)
RDW: 15.5 % (ref 11.5–15.5)
WBC: 6.8 10*3/uL (ref 4.0–10.5)
nRBC: 0 % (ref 0.0–0.2)

## 2021-09-29 LAB — BASIC METABOLIC PANEL
Anion gap: 9 (ref 5–15)
BUN: 12 mg/dL (ref 6–20)
CO2: 22 mmol/L (ref 22–32)
Calcium: 9.2 mg/dL (ref 8.9–10.3)
Chloride: 103 mmol/L (ref 98–111)
Creatinine, Ser: 0.85 mg/dL (ref 0.44–1.00)
GFR, Estimated: >60 mL/min (ref >60)
Glucose, Bld: 96 mg/dL (ref 70–99)
Potassium: 3.6 mmol/L (ref 3.5–5.1)
Sodium: 134 mmol/L — ABNORMAL LOW (ref 135–145)

## 2021-09-29 LAB — ABO/RH: ABO/RH(D): A POS

## 2021-09-29 LAB — HCG, QUANTITATIVE, PREGNANCY: hCG, Beta Chain, Quant, S: 45324 m[IU]/mL — ABNORMAL HIGH (ref <5)

## 2021-09-29 MED ORDER — METOCLOPRAMIDE HCL 10 MG PO TABS
10.0000 mg | ORAL_TABLET | Freq: Once | ORAL | Status: AC
  Administered 2021-09-29: 10 mg via ORAL
  Filled 2021-09-29: qty 1

## 2021-09-29 MED ORDER — ACETAMINOPHEN 500 MG PO TABS
1000.0000 mg | ORAL_TABLET | Freq: Once | ORAL | Status: AC
  Administered 2021-09-29: 1000 mg via ORAL
  Filled 2021-09-29: qty 2
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
## 2021-09-29 NOTE — Progress Notes (Signed)
Knox Saliva, CNM into perform MSE. ?
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
## 2021-09-29 NOTE — ED Notes (Signed)
Transport called to take pt to MAU. 

## 2021-09-29 NOTE — ED Notes (Signed)
Pt transported to MAU  

## 2021-10-01 ENCOUNTER — Telehealth: Payer: Self-pay | Admitting: General Practice

## 2021-10-01 DIAGNOSIS — G43909 Migraine, unspecified, not intractable, without status migrainosus: Secondary | ICD-10-CM

## 2021-10-01 DIAGNOSIS — O219 Vomiting of pregnancy, unspecified: Secondary | ICD-10-CM

## 2021-10-01 MED ORDER — CYCLOBENZAPRINE HCL 10 MG PO TABS: 10.0000 mg | ORAL_TABLET | Freq: Three times a day (TID) | ORAL | 0 refills | Status: DC | PRN

## 2021-10-01 MED ORDER — PROMETHAZINE HCL 25 MG PO TABS: 25.0000 mg | ORAL_TABLET | Freq: Four times a day (QID) | ORAL | 0 refills | Status: DC | PRN

## 2021-10-01 NOTE — Telephone Encounter (Signed)
Patient called into office requesting a call back from the nurse as she is not feeling well at all.  ? ?Called patient, no answer- left message to call us back.  ? ?Patient called back into office stating she was still having issues with her BV, nausea/vomiting, & bad migraines. Patient states she is having a brownish discharge with a strong odor and feels like her BV isn't going away. She reports recent treatment but it hasn't helped. Discussed with patient wet prep & STD testing were all negative indicating no infections. Discussed brownish discharge is usually old blood which can have a strong smell to it. Advised we can discuss recurrent BV at next appt as that can be an issue some patients have. Asked patient about her nausea and vomiting. Patient reports nausea/vomiting and diarrhea for past 2 days and unable to keep anything down. She states she doesn't think the reglan is working. Discussed with patient it sounds like she has a stomach virus and medication really wouldn't help with that. Recommended she try imodium and that things would have to run the course. Recommended BRAT diet and trying small bites of food first then liquids like gatorade or pedialyte for electrolyte replacement. Discussed MAU wouldn't be able to give her any meds only fluid replacement. Advised if she still has been unable to keep anything down by tonight to go to MAU. Patient verbalized understanding & requests phenergan Rx. Rx prescribed per protocol. Patient reports more frequent migraines which happened her last pregnancy and also caused seizures. She denies currently taking medicine. Recommended extra strength tylenol with a little caffeine & prescribed Flexeril per Dr Macon Large. Discussed the combo of flexeril and phenergan may also help her migraines. Reviewed upcoming appts with patient. Patient verbalized understanding to all & will call back with questions/concerns.  ?

## 2021-10-05 ENCOUNTER — Inpatient Hospital Stay (HOSPITAL_COMMUNITY)
Admission: AD | Admit: 2021-10-05 | Discharge: 2021-10-06 | Disposition: A | Discharge status: Home / Self Care | Payer: Medicaid Other | Attending: Obstetrics and Gynecology | Admitting: Obstetrics and Gynecology

## 2021-10-05 ENCOUNTER — Other Ambulatory Visit: Payer: Self-pay

## 2021-10-05 ENCOUNTER — Encounter (HOSPITAL_COMMUNITY): Payer: Self-pay | Admitting: Obstetrics and Gynecology

## 2021-10-05 ENCOUNTER — Telehealth: Payer: Medicaid Other | Admitting: Family

## 2021-10-05 DIAGNOSIS — Z3A08 8 weeks gestation of pregnancy: Secondary | ICD-10-CM | POA: Diagnosis not present

## 2021-10-05 DIAGNOSIS — O99351 Diseases of the nervous system complicating pregnancy, first trimester: Secondary | ICD-10-CM | POA: Diagnosis not present

## 2021-10-05 DIAGNOSIS — R509 Fever, unspecified: Secondary | ICD-10-CM

## 2021-10-05 DIAGNOSIS — O34219 Maternal care for unspecified type scar from previous cesarean delivery: Secondary | ICD-10-CM | POA: Insufficient documentation

## 2021-10-05 DIAGNOSIS — N939 Abnormal uterine and vaginal bleeding, unspecified: Secondary | ICD-10-CM | POA: Diagnosis not present

## 2021-10-05 DIAGNOSIS — O210 Mild hyperemesis gravidarum: Secondary | ICD-10-CM | POA: Diagnosis not present

## 2021-10-05 DIAGNOSIS — G43009 Migraine without aura, not intractable, without status migrainosus: Secondary | ICD-10-CM | POA: Diagnosis not present

## 2021-10-05 DIAGNOSIS — N898 Other specified noninflammatory disorders of vagina: Secondary | ICD-10-CM

## 2021-10-05 DIAGNOSIS — Z3A01 Less than 8 weeks gestation of pregnancy: Secondary | ICD-10-CM | POA: Insufficient documentation

## 2021-10-05 DIAGNOSIS — G43909 Migraine, unspecified, not intractable, without status migrainosus: Secondary | ICD-10-CM | POA: Diagnosis present

## 2021-10-05 LAB — WET PREP, GENITAL
Clue Cells Wet Prep HPF POC: NONE SEEN
Sperm: NONE SEEN
Trich, Wet Prep: NONE SEEN
WBC, Wet Prep HPF POC: <10 (ref <10)
Yeast Wet Prep HPF POC: NONE SEEN

## 2021-10-05 LAB — COMPREHENSIVE METABOLIC PANEL
ALT: 21 U/L (ref 0–44)
AST: 16 U/L (ref 15–41)
Albumin: 3.4 g/dL — ABNORMAL LOW (ref 3.5–5.0)
Alkaline Phosphatase: 49 U/L (ref 38–126)
Anion gap: 8 (ref 5–15)
BUN: 10 mg/dL (ref 6–20)
CO2: 23 mmol/L (ref 22–32)
Calcium: 9.3 mg/dL (ref 8.9–10.3)
Chloride: 108 mmol/L (ref 98–111)
Creatinine, Ser: 0.81 mg/dL (ref 0.44–1.00)
GFR, Estimated: >60 mL/min (ref >60)
Glucose, Bld: 112 mg/dL — ABNORMAL HIGH (ref 70–99)
Potassium: 3.6 mmol/L (ref 3.5–5.1)
Sodium: 139 mmol/L (ref 135–145)
Total Bilirubin: 0.4 mg/dL (ref 0.3–1.2)
Total Protein: 7.2 g/dL (ref 6.5–8.1)

## 2021-10-05 LAB — URINALYSIS, ROUTINE W REFLEX MICROSCOPIC
Bilirubin Urine: NEGATIVE
Glucose, UA: NEGATIVE mg/dL
Hgb urine dipstick: NEGATIVE
Ketones, ur: NEGATIVE mg/dL
Nitrite: NEGATIVE
Protein, ur: NEGATIVE mg/dL
Specific Gravity, Urine: 1.026 (ref 1.005–1.030)
pH: 6 (ref 5.0–8.0)

## 2021-10-05 MED ORDER — LACTATED RINGERS IV BOLUS
1000.0000 mL | Freq: Once | INTRAVENOUS | Status: AC
  Administered 2021-10-05: 1000 mL via INTRAVENOUS

## 2021-10-05 MED ORDER — CAFFEINE 200 MG PO TABS
200.0000 mg | ORAL_TABLET | Freq: Once | ORAL | Status: AC
  Administered 2021-10-05: 200 mg via ORAL
  Filled 2021-10-05 (×2): qty 1

## 2021-10-05 MED ORDER — LACTATED RINGERS IV BOLUS
250.0000 mL | Freq: Once | INTRAVENOUS | Status: AC
  Administered 2021-10-05: 250 mL via INTRAVENOUS

## 2021-10-05 MED ORDER — CYCLOBENZAPRINE HCL 5 MG PO TABS
10.0000 mg | ORAL_TABLET | Freq: Once | ORAL | Status: AC
  Administered 2021-10-05: 10 mg via ORAL
  Filled 2021-10-05: qty 2

## 2021-10-05 MED ORDER — DIPHENHYDRAMINE HCL 50 MG/ML IJ SOLN
12.5000 mg | Freq: Once | INTRAMUSCULAR | Status: AC
Start: 2021-10-05 — End: 2021-10-05
  Administered 2021-10-05: 12.5 mg via INTRAVENOUS
  Filled 2021-10-05: qty 1

## 2021-10-05 MED ORDER — OXYCODONE HCL 5 MG PO TABS
10.0000 mg | ORAL_TABLET | Freq: Once | ORAL | Status: AC
  Administered 2021-10-05: 10 mg via ORAL
  Filled 2021-10-05: qty 2

## 2021-10-05 MED ORDER — SODIUM CHLORIDE 0.9 % IV SOLN
25.0000 mg | Freq: Once | INTRAVENOUS | Status: AC
  Administered 2021-10-05: 25 mg via INTRAVENOUS
  Filled 2021-10-05: qty 1

## 2021-10-05 MED ORDER — ONDANSETRON 4 MG PO TBDP
8.0000 mg | ORAL_TABLET | Freq: Once | ORAL | Status: AC
Start: 2021-10-05 — End: 2021-10-05
  Administered 2021-10-05: 8 mg via ORAL
  Filled 2021-10-05: qty 2

## 2021-10-05 MED ORDER — ACETAMINOPHEN 500 MG PO TABS
1000.0000 mg | ORAL_TABLET | Freq: Once | ORAL | Status: AC
  Administered 2021-10-05: 1000 mg via ORAL
  Filled 2021-10-05: qty 2

## 2021-10-05 MED ORDER — LACTATED RINGERS IV SOLN: INTRAVENOUS | Status: DC

## 2021-10-05 NOTE — Progress Notes (Signed)
?Virtual Visit Consent  ? ?Chloe Rodgers, you are scheduled for a virtual visit with a [facility] provider today.   ?  ?Just as with appointments in the office, your consent must be obtained to participate.  Your consent will be active for this visit and any virtual visit you may have with one of our providers in the next 365 days.   ?  ?If you have a MyChart account, a copy of this consent can be sent to you electronically.  All virtual visits are billed to your insurance company just like a traditional visit in the office.   ? ?As this is a virtual visit, video technology does not allow for your provider to perform a traditional examination.  This may limit your provider's ability to fully assess your condition.  If your provider identifies any concerns that need to be evaluated in person or the need to arrange testing (such as labs, EKG, etc.), we will make arrangements to do so.   ?  ?Although advances in technology are sophisticated, we cannot ensure that it will always work on either your end or our end.  If the connection with a video visit is poor, the visit may have to be switched to a telephone visit.  With either a video or telephone visit, we are not always able to ensure that we have a secure connection.    ? ?I need to obtain your verbal consent now.   Are you willing to proceed with your visit today?  ?  ?Chloe Rodgers has provided verbal consent on 10/05/2021 for a virtual visit (video or telephone). ?  ?Jannifer Rodney, FNP  ? ?Date: 10/05/2021 5:28 PM ? ? ?Virtual Visit via Video Note  ? ?IJannifer Rodney, connected with  Chloe Rodgers  ([id number], 28-Mar-1991) on 10/05/21 at  5:30 PM EDT by a video-enabled telemedicine application and verified that I am speaking with the correct person using two identifiers. ? ?Location: ?Patient: Virtual Visit Location Patient: Mobile ?Provider: Virtual Visit Location Provider: Home Office ?  ?I discussed the limitations of evaluation and management by  telemedicine and the availability of in person appointments. The patient expressed understanding and agreed to proceed.   ? ?History of Present Illness: ?Chloe Rodgers is a 31 y.o. who identifies as a female who was assigned female at birth, and is being seen today for nausea and vomiting. Reports she is [redacted] weeks pregnant. She also reports vaginal discharge and abdominal cramping.  ? ?HPI: Vaginal Discharge ?The patient's primary symptoms include genital itching, a genital odor, vaginal bleeding (spotting) and vaginal discharge. The patient's pertinent negatives include no genital lesions. The current episode started 1 to 4 weeks ago. The problem occurs intermittently. The pain is mild. Associated symptoms include dysuria, headaches, hematuria, nausea and vomiting. Pertinent negatives include no chills, discolored urine, sore throat or urgency. Associated symptoms comments: fever. The vaginal discharge was brown.   ?Problems:  ?Patient Active Problem List  ? Diagnosis Date Noted  ? BV (bacterial vaginosis) 09/09/2020  ? Genetic testing 03/07/2020  ? Family history of breast cancer   ? Family history of colon cancer   ? Chronic PID (chronic pelvic inflammatory disease) 12/13/2017  ? Nephrolithiasis 02/22/2013  ? Migraines 07/24/2012  ? Obese 03/30/2012  ? Seizure disorder (HCC) 02/03/2012  ? Family history of breast cancer in mother 02/03/2012  ?  ?Allergies:  ?Allergies  ?Allergen Reactions  ? Naproxen Itching  ? Toradol [Ketorolac Tromethamine] Itching  ? ?Medications: No current outpatient  medications on file. ? ?Observations/Objective: ?Patient is well-developed, well-nourished in no acute distress.  ?Sitting in car ?Head is normocephalic, atraumatic.  ?No labored breathing.  ?Speech is clear and coherent with logical content.  ?Patient is alert and oriented at baseline.  ? ? ?Assessment and Plan: ?1. [redacted] weeks gestation of pregnancy ? ?2. Vaginal discharge ? ?3. Fever, unspecified fever cause ? ?4. Vaginal  spotting ? ?Advised she needed to be seen in person for further investigation given symptoms.  ?[facility] ? ?Follow Up Instructions: ?I discussed the assessment and treatment plan with the patient. The patient was provided an opportunity to ask questions and all were answered. The patient agreed with the plan and demonstrated an understanding of the instructions.  A copy of instructions were sent to the patient via MyChart unless otherwise noted below.  ? ? ? ?The patient was advised to call back or seek an in-person evaluation if the symptoms worsen or if the condition fails to improve as anticipated. ? ?Time:  ?I spent 14 minutes with the patient via telehealth technology discussing the above problems/concerns.   ? ?Jannifer Rodney, FNP ? ?

## 2021-10-05 NOTE — MAU Note (Signed)
Chloe Rodgers is a 31 y.o. at [redacted]w[redacted]d here in MAU reporting: started with a migraine yesterday, took a reglan this morning with no relief. Reports hx of migraines. Also having nausea and emesis, states unable to keep anything down. 6 episodes of vomiting. Also has vaginal discharge with odor and itching.  ? ?Onset of complaint: ongoing ? ?Pain score: 10/10 ? ?Vitals:  ? 10/05/21 1846  ?BP: (!) 143/77  ?Pulse: (!) 116  ?Resp: 20  ?Temp: 98.1 ?F (36.7 ?C)  ?SpO2: 100%  ?   ?Lab orders placed from triage: UA ? ?

## 2021-10-05 NOTE — Progress Notes (Signed)
MRI services called "will send for patient in 15 minutes." ?

## 2021-10-05 NOTE — Progress Notes (Signed)
RN called MRI services.. informed it could be anywhere between 45 minutes and a couple of more hours before they are ready for pt due to other patients from the ED being ahead.  ?

## 2021-10-05 NOTE — MAU Provider Note (Addendum)
?History  ?  ? ?CSN: [id number] ? ?Arrival date and time: 10/05/21 1832 ? ? Event Date/Time  ? First Provider Initiated Contact with Patient 10/05/21 1914   ?  ? ?Chief Complaint  ?Patient presents with  ? Headache  ? Vaginal Bleeding  ? Vaginal Discharge  ? Nausea  ? ?HPI ? ?Ms.Chloe Rodgers is a 31 y.o. female [id number] @ 6333w1d here in MAU with complaints of migraine HA, N/V. The nausea and vomiting is an ongoing problem. She has a history of migraine headaches. The migraine started yesterday and she took tylenol and reglan which did not help her symptoms. She reports photophobia, and headache all over her head. She is not able to keep anything down. She reports 6 episodes of vomiting. She reports white vaginal discharge and itching. She reports odor and at times spotting.  ? ?OB History   ? ? Gravida  ?6  ? Para  ?3  ? Term  ?3  ? Preterm  ?0  ? AB  ?2  ? Living  ?3  ?  ? ? SAB  ?1  ? IAB  ?1  ? Ectopic  ?0  ? Multiple  ?0  ? Live Births  ?3  ?   ?  ?  ? ? ?Past Medical History:  ?Diagnosis Date  ? Anemia   ? Anxiety   ? Chlamydia   ? Complication of anesthesia   ? ??, seizure with wisdom teeth  ? Depression   ? not on meds, sees a therapist  ? Family history of breast cancer   ? Family history of colon cancer   ? Infection   ? UTI  ? Kidney stone   ? kidney stones  ? Migraine   ? PID (acute pelvic inflammatory disease) 2014  ? PONV (postoperative nausea and vomiting)   ? Psoriasis   ? Seizures (HCC)   [place]  ? ? ?Past Surgical History:  ?Procedure Laterality Date  ? CESAREAN SECTION    ? CESAREAN SECTION  06/06/2012  ? Procedure: CESAREAN SECTION;  Surgeon: Adam PhenixJames G Arnold, MD;  Location: [facility];  Service: Obstetrics;  Laterality: N/A;  ? CESAREAN SECTION N/A 03/30/2017  ? Procedure: REPEAT CESAREAN SECTION;  Surgeon: Lazaro ArmsEure, Luther H, MD;  [facility];  Service: Obstetrics;  Laterality: N/A;  ? SKIN GRAFT    ? off abd, onto arm  ? WISDOM TOOTH EXTRACTION    ? ? ?Family History   ?Problem Relation Age of Onset  ? Hypertension Mother   ? Diabetes Mother   ? Stroke Mother   ? Seizures Mother   ? Breast cancer Mother   ?     dx in her early 5630s  ? Asthma Daughter   ? Hypertension Maternal Grandmother   ? Diabetes Maternal Grandmother   ? Cancer Maternal Grandmother   ?     BONE CANCER  ? Breast cancer Maternal Grandmother   ?     dx in her early 3430s  ? Colon cancer Maternal Grandmother   ?     dx in her 2840s  ? Dementia Paternal Grandmother   ? Other Neg Hx   ? ? ?Social History  ? ?Tobacco Use  ? Smoking status: Never  ? Smokeless tobacco: Never  ?Vaping Use  ? Vaping Use: Never used  ?Substance Use Topics  ? Alcohol use: Not Currently  ?  Comment: occ  ? Drug use: No  ? ? ?Allergies:  ?  Allergies  ?Allergen Reactions  ? Naproxen Itching  ? Toradol [Ketorolac Tromethamine] Itching  ? ? ?No medications prior to admission.  ? ?Results for orders placed or performed during the hospital encounter of 10/05/21 (from the past 48 hour(s))  ?Urinalysis, Routine w reflex microscopic Urine, Clean Catch     Status: Abnormal  ? Collection Time: 10/05/21  6:49 PM  ?Result Value Ref Range  ? Color, Urine YELLOW YELLOW  ? APPearance CLOUDY (A) CLEAR  ? Specific Gravity, Urine 1.026 1.005 - 1.030  ? pH 6.0 5.0 - 8.0  ? Glucose, UA NEGATIVE NEGATIVE mg/dL  ? Hgb urine dipstick NEGATIVE NEGATIVE  ? Bilirubin Urine NEGATIVE NEGATIVE  ? Ketones, ur NEGATIVE NEGATIVE mg/dL  ? Protein, ur NEGATIVE NEGATIVE mg/dL  ? Nitrite NEGATIVE NEGATIVE  ? Leukocytes,Ua MODERATE (A) NEGATIVE  ? RBC / HPF 0-5 0 - 5 RBC/hpf  ? WBC, UA 6-10 0 - 5 WBC/hpf  ? Bacteria, UA RARE (A) NONE SEEN  ? Squamous Epithelial / LPF 21-50 0 - 5  ? Mucus PRESENT   ? Amorphous Crystal PRESENT   ? Ca Oxalate Crys, UA PRESENT   ?  Comment: Performed at [facility], [address] [place], [place] [address]  ?  ?Review of Systems  ?Constitutional:  Negative for fever.  ?Gastrointestinal:  Positive for nausea and vomiting. Negative for abdominal  pain and diarrhea.  ?Genitourinary:  Positive for vaginal discharge. Negative for vaginal bleeding.  ?Physical Exam  ? ?Blood pressure 134/79, pulse 100, temperature 98.1 ?F (36.7 ?C), temperature source Oral, resp. rate 20, weight 108.5 kg, last menstrual period 08/16/2021, SpO2 100 %, unknown if currently breastfeeding. ? ?Physical Exam ?Vitals and nursing note reviewed.  ?Constitutional:   ?   General: She is not in acute distress. ?   Appearance: She is well-developed. She is ill-appearing. She is not toxic-appearing.  ?HENT:  ?   Head: Normocephalic.  ?Pulmonary:  ?   Effort: Pulmonary effort is normal.  ?Musculoskeletal:     ?   General: Normal range of motion.  ?Skin: ?   General: Skin is warm.  ?Neurological:  ?   Mental Status: She is alert and oriented to person, place, and time.  ?   GCS: GCS eye subscore is 4. GCS verbal subscore is 5. GCS motor subscore is 6.  ?Psychiatric:     ?   Behavior: Behavior normal.  ? ?MAU Course  ?Procedures ?Results for orders placed or performed during the hospital encounter of 10/05/21 (from the past 24 hour(s))  ?Urinalysis, Routine w reflex microscopic Urine, Clean Catch     Status: Abnormal  ? Collection Time: 10/05/21  6:49 PM  ?Result Value Ref Range  ? Color, Urine YELLOW YELLOW  ? APPearance CLOUDY (A) CLEAR  ? Specific Gravity, Urine 1.026 1.005 - 1.030  ? pH 6.0 5.0 - 8.0  ? Glucose, UA NEGATIVE NEGATIVE mg/dL  ? Hgb urine dipstick NEGATIVE NEGATIVE  ? Bilirubin Urine NEGATIVE NEGATIVE  ? Ketones, ur NEGATIVE NEGATIVE mg/dL  ? Protein, ur NEGATIVE NEGATIVE mg/dL  ? Nitrite NEGATIVE NEGATIVE  ? Leukocytes,Ua MODERATE (A) NEGATIVE  ? RBC / HPF 0-5 0 - 5 RBC/hpf  ? WBC, UA 6-10 0 - 5 WBC/hpf  ? Bacteria, UA RARE (A) NONE SEEN  ? Squamous Epithelial / LPF 21-50 0 - 5  ? Mucus PRESENT   ? Amorphous Crystal PRESENT   ? Ca Oxalate Crys, UA PRESENT   ?Comprehensive metabolic panel  Status: Abnormal  ? Collection Time: 10/05/21  7:18 PM  ?Result Value Ref Range  ?  Sodium 139 135 - 145 mmol/L  ? Potassium 3.6 3.5 - 5.1 mmol/L  ? Chloride 108 98 - 111 mmol/L  ? CO2 23 22 - 32 mmol/L  ? Glucose, Bld 112 (H) 70 - 99 mg/dL  ? BUN 10 6 - 20 mg/dL  ? Creatinine, Ser 0.81 0.44 - 1.00 mg/dL  ? Calcium 9.3 8.9 - 10.3 mg/dL  ? Total Protein 7.2 6.5 - 8.1 g/dL  ? Albumin 3.4 (L) 3.5 - 5.0 g/dL  ? AST 16 15 - 41 U/L  ? ALT 21 0 - 44 U/L  ? Alkaline Phosphatase 49 38 - 126 U/L  ? Total Bilirubin 0.4 0.3 - 1.2 mg/dL  ? GFR, Estimated >60 >60 mL/min  ? Anion gap 8 5 - 15  ?Wet prep, genital     Status: None  ? Collection Time: 10/05/21  7:50 PM  ?Result Value Ref Range  ? Yeast Wet Prep HPF POC NONE SEEN NONE SEEN  ? Trich, Wet Prep NONE SEEN NONE SEEN  ? Clue Cells Wet Prep HPF POC NONE SEEN NONE SEEN  ? WBC, Wet Prep HPF POC <10 <10  ? Sperm NONE SEEN   ?  ? ?Pt informed that the ultrasound is considered a limited OB ultrasound and is not intended to be a complete ultrasound exam.  Patient also informed that the ultrasound is not being completed with the intent of assessing for fetal or placental anomalies or any pelvic abnormalities.  Explained that the purpose of today?s ultrasound is to assess for  viability.  Patient acknowledges the purpose of the exam and the limitations of the study.   Active fetus with heart flutters visualized.  ? ?MDM ? ?UA, UC ?CMP  ?Lr bolus ?Phenergan bolus ?Tylenol 1 gram PO ?Benadryl 25 mg IV ?Wet prep and GC collected ?Urine culture.  ? ?Report given to Cleone Slim CNM who resumes care of the patient.  ? ?Rasch, Harolyn Rutherford, NP ? ?Patient reports headache is still 10/10 despite fluids and medication. Normal neuro exam at this time.  ? ?Flexeril PO ?Caffeine PO ? ?Patient reports headache is still a 10/10. Discussed with patient need for imaging given persistent headache with no relief. Risks and benefits reviewed at length including risk of imaging in pregnancy. Patient verbalized understanding and agreeable to plan of care.  ? ?Oxy IR 10mg  ?MRV  Head ? ?Care turned over to R. CNM at 2300.  ?Arita Miss, CNM ?10/05/21 ? ?Reassessment @ 0310: Patient reports the H/A feels a little better. She states, "the pressure is letting up a little bit."  ? ?Re

## 2021-10-06 ENCOUNTER — Inpatient Hospital Stay (HOSPITAL_COMMUNITY): Payer: Medicaid Other

## 2021-10-06 DIAGNOSIS — Z3A01 Less than 8 weeks gestation of pregnancy: Secondary | ICD-10-CM | POA: Diagnosis not present

## 2021-10-06 DIAGNOSIS — O210 Mild hyperemesis gravidarum: Secondary | ICD-10-CM

## 2021-10-06 LAB — RAPID URINE DRUG SCREEN, HOSP PERFORMED
Amphetamines: NOT DETECTED
Barbiturates: NOT DETECTED
Benzodiazepines: NOT DETECTED
Cocaine: NOT DETECTED
Opiates: NOT DETECTED
Tetrahydrocannabinol: NOT DETECTED

## 2021-10-06 LAB — GC/CHLAMYDIA PROBE AMP (~~LOC~~) NOT AT ARMC
Chlamydia: NEGATIVE
Comment: NEGATIVE
Comment: NORMAL
Neisseria Gonorrhea: NEGATIVE

## 2021-10-06 MED ORDER — OXYCODONE HCL 5 MG PO CAPS: 5.0000 mg | ORAL_CAPSULE | ORAL | 0 refills | Status: AC | PRN

## 2021-10-06 MED ORDER — ACETAMINOPHEN-CODEINE #3 300-30 MG PO TABS: 1.0000 | ORAL_TABLET | Freq: Four times a day (QID) | ORAL | 0 refills | Status: AC | PRN

## 2021-10-06 NOTE — Progress Notes (Signed)
RN called MRI services.. they stated "putting her in tele-tracking now" and transport should be in MAU to pick up pt soon.  ?

## 2021-10-06 NOTE — Progress Notes (Signed)
Written and verbal discharge instructions reviewed with pt. Pt verbalized understanding.

## 2021-10-06 NOTE — Progress Notes (Signed)
Transport picked pt up for MRI ?

## 2021-10-06 NOTE — Discharge Instructions (Addendum)
Stay well-hydrated with at least 8-10 16.9 ounce bottles of water everyday. You can use a heating pad on your head or ice pack to back of neck to help relieve pain/pressure from headache. You can purchase Melatonin over the counter for sleep. ?

## 2021-10-07 LAB — CULTURE, OB URINE

## 2021-10-13 ENCOUNTER — Telehealth (INDEPENDENT_AMBULATORY_CARE_PROVIDER_SITE_OTHER): Payer: Medicaid Other

## 2021-10-13 DIAGNOSIS — O0990 Supervision of high risk pregnancy, unspecified, unspecified trimester: Secondary | ICD-10-CM | POA: Insufficient documentation

## 2021-10-13 DIAGNOSIS — Z3480 Encounter for supervision of other normal pregnancy, unspecified trimester: Secondary | ICD-10-CM

## 2021-10-13 DIAGNOSIS — R112 Nausea with vomiting, unspecified: Secondary | ICD-10-CM

## 2021-10-13 DIAGNOSIS — F419 Anxiety disorder, unspecified: Secondary | ICD-10-CM

## 2021-10-13 DIAGNOSIS — Z3A00 Weeks of gestation of pregnancy not specified: Secondary | ICD-10-CM

## 2021-10-13 MED ORDER — UNISOM SLEEPTABS 25 MG PO TABS: 25.0000 mg | ORAL_TABLET | Freq: Every evening | ORAL | 2 refills | Status: DC | PRN

## 2021-10-13 MED ORDER — VITAMIN B-6 25 MG PO TABS: 25.0000 mg | ORAL_TABLET | Freq: Every day | ORAL | 3 refills | Status: DC

## 2021-10-13 MED ORDER — BLOOD PRESSURE MONITORING DEVI: 1.0000 | 0 refills | Status: DC

## 2021-10-13 MED ORDER — GOJJI WEIGHT SCALE MISC: 1.0000 | Freq: Every day | 0 refills | Status: DC

## 2021-10-13 MED ORDER — PRENATAL PLUS 27-1 MG PO TABS: 1.0000 | ORAL_TABLET | Freq: Every day | ORAL | 11 refills | Status: DC

## 2021-10-13 NOTE — Progress Notes (Addendum)
New OB Intake ? ?I connected with  Chloe Rodgers on 10/13/21 at  8:15 AM EDT by MyChart Video Visit and verified that I am speaking with the correct person using two identifiers. Nurse is located at [facility] and pt is located at [employer]. ? ?I discussed the limitations, risks, security and privacy concerns of performing an evaluation and management service by telephone and the availability of in person appointments. I also discussed with the patient that there may be a patient responsible charge related to this service. The patient expressed understanding and agreed to proceed. ? ?I explained I am completing New OB Intake today. We discussed her EDD of 05/23/22 that is based on LMP of 08/16/21. Pt is G6/P3. I reviewed her allergies, medications, Medical/Surgical/OB history, and appropriate screenings. I informed her of [facility] services. Based on history, this is a/an  pregnancy uncomplicated by na  .  ? ?Patient Active Problem List  ? Diagnosis Date Noted  ? BV (bacterial vaginosis) 09/09/2020  ? Genetic testing 03/07/2020  ? Family history of breast cancer   ? Family history of colon cancer   ? Chronic PID (chronic pelvic inflammatory disease) 12/13/2017  ? Nephrolithiasis 02/22/2013  ? Migraines 07/24/2012  ? Obese 03/30/2012  ? Seizure disorder (HCC) 02/03/2012  ? Family history of breast cancer in mother 02/03/2012  ? ? ?Concerns addressed today ? ?Delivery Plans:  ?Plans to deliver at [facility].  ? ?MyChart/Babyscripts ?MyChart access verified. I explained pt will have some visits in office and some virtually. Babyscripts instructions given and order placed. Patient verifies receipt of registration text/e-mail. Account successfully created and app downloaded. ? ?Blood Pressure Cuff  ?Blood pressure cuff ordered for patient to pick-up from [employer]. Explained after first prenatal appt pt will check weekly and document in Babyscripts. ? ?Weight scale: Patient does / does not  have weight scale. Weight scale  ordered for patient to pick up from [employer].  ? ?Anatomy US ?Explained first scheduled [place] will be around 19 weeks. Anatomy US scheduled for 12/29/21 at 0830. Pt notified to arrive at 0815. ?Scheduled AFP lab only appointment if CenteringPregnancy pt for same day as anatomy US.  ? ?Labs ?Discussed Avelina Laine genetic screening with patient. Would like both Panorama and Horizon drawn at new OB visit.Also if interested in genetic testing, tell patient she will need AFP 15-21 weeks to complete genetic testing .Routine prenatal labs needed. ? ?Covid Vaccine ?Patient has covid vaccine.  ? ?Is patient a CenteringPregnancy candidate? Not a candidate  "Centering Patient" indicated on sticky note ?  ?Is patient a Mom+Baby Combined Care candidate? Not a candidate   Scheduled with Mom+Baby provider  ?  ?Is patient interested in [place]? No  "Interested in [employer] - Schedule next visit with CNM" on sticky note ? ?Informed patient of [facility] website  and placed link in her AVS.  ? ?Social Determinants of Health ?Food Insecurity: Patient denies food insecurity. ?WIC Referral: Patient is interested in referral to [facility].  ?Transportation: Patient denies transportation needs. ?Childcare: Discussed no children allowed at ultrasound appointments. Offered childcare services; patient declines childcare services at this time. ? ?Send link to Pregnancy Navigators ? ? ?Placed OB Box on problem list and updated ? ?First visit review ?I reviewed new OB appt with pt. I explained she will have a pelvic exam, ob bloodwork with genetic screening, and PAP smear. Explained pt will be seen by Vonzella Nipple, PA-C at first visit; encounter routed to appropriate provider. Explained that  patient will be seen by pregnancy navigator following visit with provider. [facility] information placed in AVS.  ? ?Henrietta Dine, CMA ?10/13/2021  8:24 AM  ?

## 2021-10-13 NOTE — Patient Instructions (Signed)

## 2021-10-14 NOTE — BH Specialist Note (Signed)
Integrated Behavioral Health via Telemedicine Visit ? ?10/15/2021 ?Lavra Imler ?[id number] ? ?Number of Integrated Behavioral Health Clinician visits: 1- Initial Visit ? ?Session Start time: 28 ?  ?Session End time: 1114 ? ?Total time in minutes: 22 ? ? ?Referring Provider: Vonzella Nipple, PA-C ?Patient/Family location: Home ?[facility] Provider location: [facility] [employer] at [facility] ? ?All persons participating in visit: Patient Chloe Rodgers and [facility]  ? ?Types of Service: Individual psychotherapy and Telephone visit ? ?I connected with Chloe Rodgers and/or Chloe Rodgers  n/a  via  Telephone or Video Enabled Telemedicine Application  (Video is Caregility application) and verified that I am speaking with the correct person using two identifiers. Discussed confidentiality: Yes  ? ?I discussed the limitations of telemedicine and the availability of in person appointments.  Discussed there is a possibility of technology failure and discussed alternative modes of communication if that failure occurs. ? ?I discussed that engaging in this telemedicine visit, they consent to the provision of behavioral healthcare and the services will be billed under their insurance. ? ?Patient and/or legal guardian expressed understanding and consented to Telemedicine visit: Yes  ? ?Presenting Concerns: ?Patient and/or family reports the following symptoms/concerns: Increase in anxiety and depression,  attributed to stopping BH medication and nausea in early pregnancy; pt sees psychiatrist monthly and hasn't seen therapist in a few months.  ?Duration of problem: Ongoing; Severity of problem: severe ? ?Patient and/or Family's Strengths/Protective Factors: ?Sense of purpose ? ?Goals Addressed: ?Patient will: ? Reduce symptoms of: anxiety and depression  ? Demonstrate ability to: Increase motivation to adhere to plan of care ? ?Progress towards  Goals: ?Revised ? ?Interventions: ?Interventions utilized:  Solution-Focused Strategies, Functional Assessment of ADLs, and Psychoeducation and/or Health Education ?Standardized Assessments completed:  PHQ9/GAD7 given within two weeks ? ?Patient and/or Family Response: Patient agrees with treatment plan. ? ? ?Assessment: ?Patient currently experiencing PTSD (as previously diagnosed) and Mood disorder, unspecified.  ? ?Patient may benefit from psychoeducation and brief therapeutic interventions regarding coping with symptoms of depression and anxiety ?. ? ?Plan: ?Follow up with behavioral health clinician on : Two weeks ?Behavioral recommendations:  ?-Tell psychiatrist you are pregnant; follow medical provider advice regarding BH medications safe in pregnancy ?-Reconsider taking nausea medication as prescribed to manage symptoms ?-Accept referral to [facility] for ongoing therapy OR return to previous therapist ?Referral(s): Integrated Art gallery manager (In Clinic) and [facility] (LME/Outside Clinic) ? ?I discussed the assessment and treatment plan with the patient and/or parent/guardian. They were provided an opportunity to ask questions and all were answered. They agreed with the plan and demonstrated an understanding of the instructions. ?  ?They were advised to call back or seek an in-person evaluation if the symptoms worsen or if the condition fails to improve as anticipated. ? ?Rae Lips, LCSW ? ? ?  09/09/2020  ?  9:54 AM 11/23/2018  ? 10:28 AM 01/17/2018  ? 10:58 AM 03/28/2017  ? 10:12 AM 02/15/2017  ?  4:08 PM  ?Depression screen PHQ 2/9  ?Decreased Interest 3 3 2 3 2   ?Down, Depressed, Hopeless 3 3 2 3 2   ?PHQ - 2 Score 6 6 4 6 4   ?Altered sleeping 3 3 3 3 3   ?Tired, decreased energy 3 3 3 3 3   ?Change in appetite 3 1 2  0 1  ?Feeling bad or failure about yourself  3 1 1  0 0  ?Trouble concentrating 3 3 3 3 1   ?  Moving slowly or fidgety/restless 0 1 1 0 0  ?Suicidal thoughts 0 0 0 0 0   ?PHQ-9 Score 21 18 17 15 12   ? ? ?  09/09/2020  ?  9:54 AM 11/23/2018  ? 10:27 AM 01/17/2018  ? 10:58 AM 03/28/2017  ? 10:12 AM  ?GAD 7 : Generalized Anxiety Score  ?Nervous, Anxious, on Edge 3 3 3 3   ?Control/stop worrying 3 3 3 2   ?Worry too much - different things 3 3 3 2   ?Trouble relaxing 3 3 3 2   ?Restless 3 3 3 2   ?Easily annoyed or irritable 3 3 3 2   ?Afraid - awful might happen 3 0 0 0  ?Total GAD 7 Score 21 18 18 13   ? ? ? ?

## 2021-10-15 ENCOUNTER — Ambulatory Visit (INDEPENDENT_AMBULATORY_CARE_PROVIDER_SITE_OTHER): Payer: Medicaid Other | Admitting: Clinical

## 2021-10-15 DIAGNOSIS — F4310 Post-traumatic stress disorder, unspecified: Secondary | ICD-10-CM

## 2021-10-15 DIAGNOSIS — F39 Unspecified mood [affective] disorder: Secondary | ICD-10-CM

## 2021-10-15 NOTE — Patient Instructions (Signed)
[facility] [address] [place], [place] [address] [phone] (main office) [phone] (Donyell Carrell's office)  [facility]  [address], [place], [place] [address] [phone] or [phone] WALK-IN URGENT CARE 24/7 FOR ANYONE [address], ,   [phone] Fax: [phone] [email or web address] *Interpreters available *Accepts all insurance and uninsured for Urgent Care needs *Accepts Medicaid and uninsured for outpatient treatment     ONLY FOR [place] Residents  New patient assessment and therapy walk-ins Mondays and Wednesdays 8am-11am First and second Fridays 1pm-5pm  New patient psychiatry and medication management walk-ins:  Mondays, Wednesdays, Thursdays, Fridays 8am -11am NO PSYCHIATRY WALK-INS on TUESDAYS   

## 2021-10-20 ENCOUNTER — Ambulatory Visit: Payer: Medicaid Other

## 2021-10-26 ENCOUNTER — Inpatient Hospital Stay (HOSPITAL_COMMUNITY)
Admission: AD | Admit: 2021-10-26 | Discharge: 2021-10-26 | Disposition: A | Discharge status: Home / Self Care | Payer: Medicaid Other | Attending: Family Medicine | Admitting: Family Medicine

## 2021-10-26 ENCOUNTER — Encounter (HOSPITAL_COMMUNITY): Payer: Self-pay | Admitting: Family Medicine

## 2021-10-26 ENCOUNTER — Telehealth: Payer: Self-pay | Admitting: General Practice

## 2021-10-26 ENCOUNTER — Other Ambulatory Visit: Payer: Self-pay

## 2021-10-26 DIAGNOSIS — F1120 Opioid dependence, uncomplicated: Secondary | ICD-10-CM

## 2021-10-26 DIAGNOSIS — G894 Chronic pain syndrome: Secondary | ICD-10-CM | POA: Diagnosis not present

## 2021-10-26 DIAGNOSIS — O26891 Other specified pregnancy related conditions, first trimester: Secondary | ICD-10-CM | POA: Insufficient documentation

## 2021-10-26 DIAGNOSIS — O99351 Diseases of the nervous system complicating pregnancy, first trimester: Secondary | ICD-10-CM | POA: Insufficient documentation

## 2021-10-26 DIAGNOSIS — O99321 Drug use complicating pregnancy, first trimester: Secondary | ICD-10-CM | POA: Insufficient documentation

## 2021-10-26 DIAGNOSIS — Z3A10 10 weeks gestation of pregnancy: Secondary | ICD-10-CM | POA: Diagnosis not present

## 2021-10-26 NOTE — MAU Note (Signed)
Chloe Rodgers is a 31 y.o. at [redacted]w[redacted]d here in MAU reporting: ongoing headaches and chronic pelvic pain. Has been Rx oxycodone and xtampza but has not been taking them as often due to pregnancy, last dose was a couple days ago. Pt feels like she is withdrawing, states she has been irritable and her whole body aches. ? ?Onset of complaint: ongoing ? ?Pain score: body pain 8/10, headache 10/10, lower abdomen 8/10 ? ?Vitals:  ? 10/26/21 1443  ?BP: 140/78  ?Pulse: (!) 112  ?Resp: 16  ?Temp: 98.5 ?F (36.9 ?C)  ?SpO2: 95%  ?   ?DJM:EQASTMHDQ ? ?Lab orders placed from triage: none ? ?

## 2021-10-26 NOTE — Telephone Encounter (Signed)
Patient called into front office with concern of elevated blood pressures at home over the past few days. Patient reports history of headaches that get worse during pregnancy. She reports seeing some black spots in her vision with the headaches. Offered nurse visit appt 4/19 @ 830 due to patient's schedule and asked she bring her BP cuff with her. Recommended she take 2 extra strength tylenol for her headaches & lay down for an hour in a quiet room to see if headaches improve. Patient verbalized understanding. Patient states that she has chronic pelvic pain and has been on oxycodone TID and xtampza BID for 3 years. She is worried about them affecting the baby so she is only taking those once a day now. Patient states as a result she feels like she is going through withdrawals for the past few days. Told patient that is also likely why her headaches have been worse & why her blood pressure has been elevated. Told patient I am reaching out to one of our providers that specializes in this for recommendations and will call her back whenever I hear from them. Patient verbalized understanding.  ? ?Spoke with Dr Crissie Reese who recommends patient go to MAU now for treatment so he can evaluate her. Called patient, no answer- left message to call me back regarding previous phone conversation.  ?
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
## 2021-10-26 NOTE — Discharge Instructions (Signed)
You were seen in the MAU for concern for opioid dependence and its effect on your pregnancy.  ? ?We recommend at this time that you continue taking your prescribed medications and DO NOT attempt to wean down or stop them. Going through withdrawal is detrimental to your health and the health of your pregnancy.  ? ?We will place a consult order for the neonatal abstinence team, and they will contact you to discuss details of what your hospital stay will be like when baby is born.  ? ?Please call our clinic if you have any questions or concerns.  ?

## 2021-10-26 NOTE — Telephone Encounter (Signed)
Called patient back and discussed going to MAU for treatment of withdrawal. Patient verbalized understanding. ?

## 2021-10-27 ENCOUNTER — Other Ambulatory Visit: Payer: Self-pay | Admitting: Family Medicine

## 2021-10-27 DIAGNOSIS — F1120 Opioid dependence, uncomplicated: Secondary | ICD-10-CM | POA: Insufficient documentation

## 2021-10-27 NOTE — Progress Notes (Signed)
NAS consult ?

## 2021-10-28 ENCOUNTER — Ambulatory Visit: Payer: Medicaid Other

## 2021-11-01 ENCOUNTER — Telehealth: Payer: Self-pay | Admitting: Obstetrics & Gynecology

## 2021-11-01 NOTE — Telephone Encounter (Signed)
? ? ? ?  Faculty Practice OB/GYN Physician Babyscripts Phone Call Documentation ? ?I had a phone conversation with [employer] about elevated BP of 116/104 for Chloe Rodgers who is [redacted] weeks gestation, no history of BP issues.   I called [name] at home, and after verifying two patient identifiers, asked about this BP.  After talking to her, the 104 was actually the HR.  Repeat BP was 121/85, HR 100.  No concerning symptoms. She was told to continue BP checks and let us know or come to MAU if still elevated or if she develops any concerning symptoms.   ? ? ?Jaynie Collins, MD, FACOG ?Obstetrician Heritage manager, Faculty Practice ?[facility] for [employer], [facility] ?

## 2021-11-05 ENCOUNTER — Ambulatory Visit (INDEPENDENT_AMBULATORY_CARE_PROVIDER_SITE_OTHER): Payer: Medicaid Other | Admitting: Medical

## 2021-11-05 ENCOUNTER — Other Ambulatory Visit (HOSPITAL_COMMUNITY)
Admission: RE | Admit: 2021-11-05 | Discharge: 2021-11-05 | Disposition: A | Discharge status: Home / Self Care | Payer: Medicaid Other | Source: Ambulatory Visit | Attending: Medical | Admitting: Medical

## 2021-11-05 VITALS — BP 125/80 | HR 98 | Wt 245.2 lb

## 2021-11-05 DIAGNOSIS — O0990 Supervision of high risk pregnancy, unspecified, unspecified trimester: Secondary | ICD-10-CM | POA: Insufficient documentation

## 2021-11-05 DIAGNOSIS — N760 Acute vaginitis: Secondary | ICD-10-CM

## 2021-11-05 DIAGNOSIS — Z3A11 11 weeks gestation of pregnancy: Secondary | ICD-10-CM

## 2021-11-05 DIAGNOSIS — F1120 Opioid dependence, uncomplicated: Secondary | ICD-10-CM

## 2021-11-05 DIAGNOSIS — R110 Nausea: Secondary | ICD-10-CM

## 2021-11-05 DIAGNOSIS — G40909 Epilepsy, unspecified, not intractable, without status epilepticus: Secondary | ICD-10-CM

## 2021-11-05 DIAGNOSIS — O99210 Obesity complicating pregnancy, unspecified trimester: Secondary | ICD-10-CM | POA: Insufficient documentation

## 2021-11-05 DIAGNOSIS — Z98891 History of uterine scar from previous surgery: Secondary | ICD-10-CM

## 2021-11-05 DIAGNOSIS — K640 First degree hemorrhoids: Secondary | ICD-10-CM

## 2021-11-05 DIAGNOSIS — B9689 Other specified bacterial agents as the cause of diseases classified elsewhere: Secondary | ICD-10-CM

## 2021-11-05 MED ORDER — ASPIRIN EC 81 MG PO TBEC: 81.0000 mg | DELAYED_RELEASE_TABLET | Freq: Every day | ORAL | 11 refills | Status: DC

## 2021-11-05 MED ORDER — METOCLOPRAMIDE HCL 10 MG PO TABS: 10.0000 mg | ORAL_TABLET | Freq: Three times a day (TID) | ORAL | 0 refills | Status: DC | PRN

## 2021-11-05 MED ORDER — WITCH HAZEL-GLYCERIN EX PADS: 1.0000 "application " | MEDICATED_PAD | CUTANEOUS | 1 refills | Status: DC | PRN

## 2021-11-05 NOTE — Patient Instructions (Signed)

## 2021-11-05 NOTE — Progress Notes (Signed)
Patient reports vaginal irritation that consist of burning and itching. She also stated that she has been having "brown" vaginal discharge that she describes the smell as "bleachy"  ? ?PHQ9 results were addressed and Chloe Rodgers would like to make an appointment with Behavior Health due to her feeling "depressed"  ?

## 2021-11-05 NOTE — BH Specialist Note (Signed)
Integrated Behavioral Health via Telemedicine Visit ? ?11/05/2021 ?Chloe Rodgers ?[id number] ? ?Number of Integrated Behavioral Health Clinician visits: 1- Initial Visit ? ?Session Start time: 92 ?  ?Session End time: 1114 ? ?Total time in minutes: 22 ? ? ?Referring Provider: Merian Capron, MD ?Patient/Family location: Home ?[facility] Provider location: [facility] [employer] [facility] ? ?All persons participating in visit: Patient Chloe Rodgers and Chloe Rodgers  ? ?Types of Service: Individual psychotherapy and Telephone visit ? ?I connected with Chloe Rodgers and/or Chloe Rodgers  n/a  via  Telephone or Video Enabled Telemedicine Application  (Video is Caregility application) and verified that I am speaking with the correct person using two identifiers. Discussed confidentiality: Yes  ? ?I discussed the limitations of telemedicine and the availability of in person appointments.  Discussed there is a possibility of technology failure and discussed alternative modes of communication if that failure occurs. ? ?I discussed that engaging in this telemedicine visit, they consent to the provision of behavioral healthcare and the services will be billed under their insurance. ? ?Patient and/or legal guardian expressed understanding and consented to Telemedicine visit: Yes  ? ?Presenting Concerns: ?Patient and/or family reports the following symptoms/concerns: Chronic pain, nausea, sleep difficulty; anxious and worried about sickness/negative side effects if pain medication is discontinued; goal is to manage pain and reduce sickness for healthy pregnancy. Pt has a therapist she will start seeing weekly and psychiatry seen monthly. ?Duration of problem: Current pregnancy increase; Severity of problem:  moderately severe ? ?Patient and/or Family's Strengths/Protective Factors: ?Social connections, Concrete supports in place (healthy food, safe environments, etc.), and Sense of  purpose ? ?Goals Addressed: ?Patient will: ? Reduce symptoms of: anxiety, depression, insomnia, and stress  ? Increase knowledge and/or ability of: coping skills and healthy habits  ? Demonstrate ability to: Increase healthy adjustment to current life circumstances ? ?Progress towards Goals: ?Ongoing ? ?Interventions: ?Interventions utilized:  Copywriter, advertising and Psychoeducation and/or Health Education ?Standardized Assessments completed: GAD-7 and PHQ 9 ? ?Patient and/or Family Response: Patient agrees with treatment plan. ? ? ?Assessment: ?Patient currently experiencing PTSD, OUD (both as previously diagnosed) and Psychosocial stress.  ? ?Patient may benefit from psychoeducation and brief therapeutic interventions regarding coping with symptoms of anxiety, chronic pain, life stress ?. ? ?Plan: ?Follow up with behavioral health clinician on : Call [name] at [phone], as needed. ?Behavioral recommendations:  ?-Continue working with Pain Clinic, OB/GYN and Psychiatry medical providers for medication management ?-Continue seeing ongoing therapist weekly; psychiatrist monthly ?-CALM relaxation breathing exercise twice daily (morning; at bedtime); as needed throughout the day. ?-Read through additional information/resources on After Visit Summary ?Referral(s): Integrated [employer] (In Clinic) ? ?I discussed the assessment and treatment plan with the patient and/or parent/guardian. They were provided an opportunity to ask questions and all were answered. They agreed with the plan and demonstrated an understanding of the instructions. ?  ?They were advised to call back or seek an in-person evaluation if the symptoms worsen or if the condition fails to improve as anticipated. ? ?Rae Lips, LCSW ? ? ?  11/05/2021  ?  8:31 AM 09/09/2020  ?  9:54 AM 11/23/2018  ? 10:28 AM 01/17/2018  ? 10:58 AM 03/28/2017  ? 10:12 AM  ?Depression screen PHQ 2/9  ?Decreased Interest 2 3 3 2 3   ?Down,  Depressed, Hopeless 2 3 3 2 3   ?PHQ - 2 Score 4 6 6 4 6   ?Altered sleeping 2  3 3 3 3   ?Tired, decreased energy 2 3 3 3 3   ?Change in appetite 2 3 1 2  0  ?Feeling bad or failure about yourself  2 3 1 1  0  ?Trouble concentrating 3 3 3 3 3   ?Moving slowly or fidgety/restless 2 0 1 1 0  ?Suicidal thoughts 0 0 0 0 0  ?PHQ-9 Score 17 21 18 17 15   ? ? ?  11/05/2021  ?  8:32 AM 09/09/2020  ?  9:54 AM 11/23/2018  ? 10:27 AM 01/17/2018  ? 10:58 AM  ?GAD 7 : Generalized Anxiety Score  ?Nervous, Anxious, on Edge 2 3 3 3   ?Control/stop worrying 2 3 3 3   ?Worry too much - different things 2 3 3 3   ?Trouble relaxing 2 3 3 3   ?Restless 2 3 3 3   ?Easily annoyed or irritable 2 3 3 3   ?Afraid - awful might happen 2 3 0 0  ?Total GAD 7 Score 14 21 18 18   ? ? ? ?

## 2021-11-05 NOTE — Progress Notes (Signed)
? ?PRENATAL VISIT NOTE ? ?Subjective:  ?Chloe Rodgers is a 31 y.o. [phone] at [redacted]w[redacted]d being seen today for her first prenatal visit for this pregnancy.  She is currently monitored for the following issues for this high-risk pregnancy and has Seizure disorder ([place]); Family history of breast cancer in mother; Obese; Migraines; Nephrolithiasis; Chronic PID (chronic pelvic inflammatory disease); Family history of breast cancer; Family history of colon cancer; Genetic testing; Supervision of high risk pregnancy, antepartum; Uncomplicated opioid dependence ([place]); Obesity in pregnancy, antepartum; and History of cesarean section on their problem list. ? ?Patient reports nausea.  Contractions: Not present. Vag. Bleeding: None.  Movement: Absent. Denies leaking of fluid.  ? ?She is undecided on MOC. Desires BTL for contraception.  ? ?The following portions of the patient's history were reviewed and updated as appropriate: allergies, current medications, past family history, past medical history, past social history, past surgical history and problem list.  ? ?Objective:  ? ?Vitals:  ? 11/05/21 0827  ?BP: 125/80  ?Pulse: 98  ?Weight: 245 lb 3.2 oz (111.2 kg)  ? ? ?Fetal Status: Fetal Heart Rate (bpm): [place]   Movement: Absent    ? ?General:  Alert, oriented and cooperative. Patient is in no acute distress.  ?Skin: Skin is warm and dry. No rash noted.   ?Cardiovascular: Normal heart rate and rhythm noted  ?Respiratory: Normal respiratory effort, no problems with respiration noted. Clear to auscultation.   ?Abdomen: Soft, gravid, appropriate for gestational age.  Non-tender. Pain/Pressure: Present     ?Pelvic: Cervical exam performed Dilation: Closed Effacement (%): Thick   ?Normal cervical contour, no lesions, small bleeding following pap, small white, malodorous discharge noted ?Small, non-thrombosed hemorrhoid noted peri-rectally  ?Extremities: Normal range of motion.  Edema: Trace  ?Mental Status: Normal mood and affect.  Normal behavior. Normal judgment and thought content.  ? ?Pt informed that the ultrasound is considered a limited OB ultrasound and is not intended to be a complete ultrasound exam.  Patient also informed that the ultrasound is not being completed with the intent of assessing for fetal or placental anomalies or any pelvic abnormalities.  Explained that the purpose of today?s ultrasound is to assess for  viability.  Patient acknowledges the purpose of the exam and the limitations of the study.  +FM and +FHR noted with POCUS. Unable to obtain rate on POCUS, appears normal.  ? ?Assessment and Plan:  ?Pregnancy: [id number] at [redacted]w[redacted]d ?1. Supervision of high risk pregnancy, antepartum ?- Culture, OB Urine ?- Hemoglobin A1c ?- Genetic Screening ?- Cytology - PAP( [place]) ?- Hepatitis B Surface AntiGEN ?- Hepatitis C Antibody ?- Cervicovaginal ancillary only( [place]) ? ?2. Uncomplicated opioid dependence ([place]) ?- Plan to see Dr. Dione Plover at next visit, will follow-up with pain specialist about management during pregnancy ?- Advised not to discontinue pain medication at this time ? ?3. Obesity in pregnancy, antepartum ?- BASA, patient with mild allergy (itching) to naproxen, advised if itching presents to discontinue  ?- Hemoglobin A1c ?- aspirin EC 81 MG tablet; Take 1 tablet (81 mg total) by mouth daily. Swallow whole.  Dispense: 30 tablet; Refill: 11 ? ?4. History of cesarean section ?- Planning repeat ?- Has had C/S x 3 ? ?5. [redacted] weeks gestation of pregnancy ? ?6. Seizure disorder ([place]) ?- Has neurology appt next month, no current medications  ? ?7. Nausea ?- metoCLOPramide (REGLAN) 10 MG tablet; Take 1 tablet (10 mg total) by mouth every 8 (eight) hours as needed for nausea.  Dispense:  30 tablet; Refill: 0 ? ?8. Grade I hemorrhoids ?- witch hazel-glycerin (TUCKS) pad; Apply 1 application. topically as needed for itching.  Dispense: 40 each; Refill: 1 ? ?Preterm labor/first trimester warning symptoms and general  obstetric precautions including but not limited to vaginal bleeding, contractions, leaking of fluid and fetal movement were reviewed in detail with the patient. ?Please refer to After Visit Summary for other counseling recommendations.  ? ?Discussed the normal visit cadence for prenatal care ?Discussed the nature of our practice with multiple providers including residents and students  ? ?Return in about 4 weeks (around 12/03/2021) for  - try to schedule with Dr. Dione Plover, [facility] MD only. ? ?Future Appointments  ?Date Time Provider [place]  ?11/12/2021 11:30 AM Genia Harold, MD [facility] None  ?11/19/2021  9:15 AM [facility] [place]  ?12/01/2021  8:35 AM Clarnce Flock, MD [facility]  ?12/29/2021  8:15 AM [facility] NURSE [facility]  ?12/29/2021  8:30 AM [facility] US3 [facility]  ? ? ?Kerry Hough, PA-C ? ?

## 2021-11-06 ENCOUNTER — Encounter: Payer: Self-pay | Admitting: *Deleted

## 2021-11-06 LAB — CERVICOVAGINAL ANCILLARY ONLY
Bacterial Vaginitis (gardnerella): POSITIVE — AB
Candida Glabrata: NEGATIVE
Candida Vaginitis: NEGATIVE
Chlamydia: NEGATIVE
Comment: NEGATIVE
Comment: NEGATIVE
Comment: NEGATIVE
Comment: NEGATIVE
Comment: NEGATIVE
Comment: NORMAL
Neisseria Gonorrhea: NEGATIVE
Trichomonas: NEGATIVE

## 2021-11-06 LAB — HEMOGLOBIN A1C
Est. average glucose Bld gHb Est-mCnc: 97 mg/dL
Hgb A1c MFr Bld: 5 % (ref 4.8–5.6)

## 2021-11-06 LAB — HEPATITIS C ANTIBODY: Hep C Virus Ab: NONREACTIVE

## 2021-11-06 LAB — HEPATITIS B SURFACE ANTIGEN: Hepatitis B Surface Ag: NEGATIVE

## 2021-11-06 MED ORDER — METRONIDAZOLE 500 MG PO TABS: 500.0000 mg | ORAL_TABLET | Freq: Two times a day (BID) | ORAL | 0 refills | Status: DC

## 2021-11-06 NOTE — Addendum Note (Signed)
Addended by: Marny Lowenstein on: 11/06/2021 02:11 PM ? ? Modules accepted: Orders ? ?

## 2021-11-07 LAB — URINE CULTURE, OB REFLEX

## 2021-11-07 LAB — CULTURE, OB URINE

## 2021-11-10 LAB — CYTOLOGY - PAP
Comment: NEGATIVE
Diagnosis: NEGATIVE
High risk HPV: NEGATIVE

## 2021-11-11 ENCOUNTER — Telehealth: Payer: Self-pay

## 2021-11-11 ENCOUNTER — Encounter: Payer: Self-pay | Admitting: Family Medicine

## 2021-11-11 NOTE — Telephone Encounter (Signed)
Called pt to follow up on MyChart message. Pt reports she was told that her new insurance requires PA before paying for pain medication. Was told by pharmacy Medicaid will pay for 5 days. Encouraged pt to fill the partial prescription and contact her pain management provider to notify them she has gotten partial prescription and needs PA completed ASAP. ?

## 2021-11-12 ENCOUNTER — Ambulatory Visit: Payer: Medicaid Other | Admitting: Psychiatry

## 2021-11-19 ENCOUNTER — Ambulatory Visit (INDEPENDENT_AMBULATORY_CARE_PROVIDER_SITE_OTHER): Payer: Medicaid Other | Admitting: Clinical

## 2021-11-19 DIAGNOSIS — F4310 Post-traumatic stress disorder, unspecified: Secondary | ICD-10-CM

## 2021-11-19 DIAGNOSIS — F1120 Opioid dependence, uncomplicated: Secondary | ICD-10-CM

## 2021-11-19 DIAGNOSIS — Z658 Other specified problems related to psychosocial circumstances: Secondary | ICD-10-CM

## 2021-11-19 NOTE — Patient Instructions (Signed)
[facility] ?[address] ?[place], [place] [address] ?[phone] (main office) ?[phone] (Cade Dashner's office) ? ?[email or web address] ? ? ? ? ?BRAINSTORMING ? ?Develop a Plan ?Goals: ?Provide a way to start conversation about your new life with a baby ?Assist parents in recognizing and using resources within their reach ?Help pave the way before birth for an easier period of transition afterwards. ? ?Make a list of the following information to keep in a central location: ?Full name of Mom and Partner: _____________________________________________ ?Baby's full name and Date of Birth: ___________________________________________ ?Home Address: ___________________________________________________________ ?________________________________________________________________________ ?Home Phone: ____________________________________________________________ ?Parents' cell numbers: _____________________________________________________ ?________________________________________________________________________ ?Name and contact info for OB: ______________________________________________ ?Name and contact info for Pediatrician:________________________________________ ?Contact info for Lactation Consultants: ________________________________________ ? ?REST and SLEEP ?*You each need at least 4-5 hours of uninterrupted sleep every day. Write specific names and contact information.* ?How are you going to rest in the postpartum period? While partner's home? When partner returns to work? When you both return to work? ?Where will your baby sleep? ?Who is available to help during the day? Evening? Night? ?Who could move in for a period to help support you? ?What are some ideas to help you get enough  sleep? ?__________________________________________________________________________________________________________________________________________________________________________________________________________________________________________ ?NUTRITIOUS FOOD AND DRINK ?*Plan for meals before your baby is born so you can have healthy food to eat during the immediate postpartum period.* ?Who will look after breakfast? Lunch? Dinner? List names and contact information. Brainstorm quick, healthy ideas for each meal. ?What can you do before baby is born to prepare meals for the postpartum period? ?How can others help you with meals? ?Which grocery stores provide online shopping and delivery? ?Which restaurants offer take-out or delivery options? ?______________________________________________________________________________________________________________________________________________________________________________________________________________________________________________________________________________________________________________________________________________________________________________________________________ ? ?CARE FOR MOM ?*It's important that mom is cared for and pampered in the postpartum period. Remember, the most important ways new mothers need care are: sleep, nutrition, gentle exercise, and time off.* ?Who can come take care of mom during this period? Make a list of people with their contact information. ?List some activities that make you feel cared for, rested, and energized? Who can make sure you have opportunities to do these things? ?Does mom have a space of her very own within your home that's just for her? Make a ?Mama Cave? where she can be comfortable, rest, and renew herself  daily. ?______________________________________________________________________________________________________________________________________________________________________________________________________________________________________________________________________________________________________________________________________________________________________________________________________ ? ? ? ?CARE FOR AND FEEDING BABY ?*Knowledgeable and encouraging people will offer the best support with regard to feeding your baby.* ?Educate yourself and choose the best feeding option for your baby. ?Make a list of people who will guide, support, and be a resource for you as your care for and feed your baby. (Friends that have breastfed or are currently breastfeeding, lactation consultants, breastfeeding support groups, etc.) ?Consider a postpartum doula. (These websites can give you information: dona.org & padanc.org) ?Seek out local breastfeeding resources like the breastfeeding support group at [facility] or [employer]. ?______________________________________________________________________________________________________________________________________________________________________________________________________________________________________________________________________________________________________________________________________________________________________________________________________ ? ?CHORES AND ERRANDS ?Who can help with a thorough cleaning before baby is born? ?Make a list of people who will help with housekeeping and chores, like laundry, light cleaning, dishes, bathrooms, etc. ?Who can run some errands for you? ?What can you do to make sure you are stocked with basic supplies before baby is born? ?Who is going to do the  shopping? ?______________________________________________________________________________________________________________________________________________________________________________________________________________________________________________________________________________________________________________________________________________________________________________________________________ ? ? ? ? ?Family Adjustment ?*Nurture yourselves?it helps parents be more loving and allows for better bonding with their child.* ?What sorts of things do you and   partner enjoy doing together? Which activities help you to connect and strengthen your relationship? Make a list of those things. Make a list of people whom you trust to care for your baby so you can have some time together as a couple. ?What types of things help partner feel connected to Mom? Make a list. ?What needs will partner have in order to bond with baby? ?Other children? Who will care for them when you go into labor and while you are in the hospital? ?Think about what the needs of your older children might be. Who can help you meet those needs? In what ways are you helping them prepare for bringing baby home? List some specific strategies you have for family adjustment. ?_______________________________________________________________________________________________________________________________________________________________________________________________________________________________________________________________________________________________________________________________________________ ? ?SUPPORT ?*Someone who can empathize with experiences normalizes your problems and makes them more bearable.* ?Make a list of other friends, neighbors, and/or co-workers you know with infants (and small children, if applicable) with whom you can connect. ?Make a list of local or online support groups, mom groups, etc. in which you can be  involved. ?______________________________________________________________________________________________________________________________________________________________________________________________________________________________________________________________________________________________________________________________________________________________________________________________________ ? ?Childcare Plans ?Investigate and plan for childcare if mom is returning to work. ?Talk about mom's concerns about her transition back to work. ?Talk about partner's concerns regarding this transition. ? ?Mental Health ?*Your mental health is one of the highest priorities for a pregnant or postpartum mom.* ?1 in 5 women experience anxiety and/or depression from the time of concepti

## 2021-11-26 ENCOUNTER — Other Ambulatory Visit: Payer: Self-pay

## 2021-11-26 ENCOUNTER — Encounter (HOSPITAL_COMMUNITY): Payer: Self-pay | Admitting: Obstetrics and Gynecology

## 2021-11-26 ENCOUNTER — Inpatient Hospital Stay (HOSPITAL_COMMUNITY)
Admission: AD | Admit: 2021-11-26 | Discharge: 2021-11-27 | Disposition: A | Discharge status: Home / Self Care | Payer: Medicaid Other | Attending: Obstetrics and Gynecology | Admitting: Obstetrics and Gynecology

## 2021-11-26 DIAGNOSIS — Z3A14 14 weeks gestation of pregnancy: Secondary | ICD-10-CM | POA: Insufficient documentation

## 2021-11-26 DIAGNOSIS — O26892 Other specified pregnancy related conditions, second trimester: Secondary | ICD-10-CM | POA: Insufficient documentation

## 2021-11-26 DIAGNOSIS — O99352 Diseases of the nervous system complicating pregnancy, second trimester: Secondary | ICD-10-CM | POA: Insufficient documentation

## 2021-11-26 DIAGNOSIS — G43809 Other migraine, not intractable, without status migrainosus: Secondary | ICD-10-CM | POA: Diagnosis not present

## 2021-11-26 DIAGNOSIS — G43909 Migraine, unspecified, not intractable, without status migrainosus: Secondary | ICD-10-CM | POA: Diagnosis present

## 2021-11-26 DIAGNOSIS — R112 Nausea with vomiting, unspecified: Secondary | ICD-10-CM

## 2021-11-26 DIAGNOSIS — R519 Headache, unspecified: Secondary | ICD-10-CM

## 2021-11-26 DIAGNOSIS — Z98891 History of uterine scar from previous surgery: Secondary | ICD-10-CM

## 2021-11-26 DIAGNOSIS — O0990 Supervision of high risk pregnancy, unspecified, unspecified trimester: Secondary | ICD-10-CM

## 2021-11-26 DIAGNOSIS — O219 Vomiting of pregnancy, unspecified: Secondary | ICD-10-CM

## 2021-11-26 LAB — URINALYSIS, ROUTINE W REFLEX MICROSCOPIC
Bilirubin Urine: NEGATIVE
Glucose, UA: NEGATIVE mg/dL
Hgb urine dipstick: NEGATIVE
Ketones, ur: NEGATIVE mg/dL
Leukocytes,Ua: NEGATIVE
Nitrite: NEGATIVE
Protein, ur: NEGATIVE mg/dL
Specific Gravity, Urine: 1.016 (ref 1.005–1.030)
pH: 6 (ref 5.0–8.0)

## 2021-11-26 MED ORDER — CAFFEINE-SODIUM BENZOATE 125-125 MG/ML IJ SOLN
250.0000 mg | Freq: Once | INTRAMUSCULAR | Status: DC
  Filled 2021-11-26: qty 1

## 2021-11-26 MED ORDER — DOXYLAMINE-PYRIDOXINE 10-10 MG PO TBEC: 2.0000 | DELAYED_RELEASE_TABLET | Freq: Every evening | ORAL | 2 refills | Status: DC | PRN

## 2021-11-26 MED ORDER — LACTATED RINGERS IV SOLN: Freq: Once | INTRAVENOUS | Status: AC

## 2021-11-26 MED ORDER — MAGNESIUM SULFATE 2 GM/50ML IV SOLN
2.0000 g | Freq: Once | INTRAVENOUS | Status: AC
  Administered 2021-11-26: 2 g via INTRAVENOUS
  Filled 2021-11-26: qty 50

## 2021-11-26 MED ORDER — METOCLOPRAMIDE HCL 5 MG/ML IJ SOLN
10.0000 mg | Freq: Once | INTRAMUSCULAR | Status: DC
  Filled 2021-11-26: qty 2

## 2021-11-26 MED ORDER — DIPHENHYDRAMINE HCL 50 MG/ML IJ SOLN
25.0000 mg | Freq: Once | INTRAMUSCULAR | Status: AC
  Administered 2021-11-26: 25 mg via INTRAVENOUS
  Filled 2021-11-26: qty 1

## 2021-11-26 MED ORDER — CAFFEINE 200 MG PO TABS
200.0000 mg | ORAL_TABLET | Freq: Once | ORAL | Status: AC
  Administered 2021-11-27: 200 mg via ORAL
  Filled 2021-11-26: qty 1

## 2021-11-26 NOTE — MAU Note (Signed)
.  Chloe Rodgers is a 31 y.o. at [redacted]w[redacted]d here in MAU reporting migraine h/a since last night. Hx migraines. Tylenol not helping. Had BV last month and finished meds but wondered if still had BV and could cause h/a. Med she was prescribed for h/a does not help so she did not take it. N/V for 4 days. Sometimes gets "chills" like a fever but has not taken temp. LMP: [facility] 05/23/22 Onset of complaint: last night Pain score: 10 Vitals:   11/26/21 2143 11/26/21 2145  BP:  138/87  Resp: 17   Temp: 99 F (37.2 C)   SpO2: 100%      FHT:166 Lab orders placed from triage:  u/a

## 2021-11-26 NOTE — Addendum Note (Signed)
Addended by: Maxwell Marion E on: 11/26/2021 02:57 PM   Modules accepted: Orders
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
## 2021-11-26 NOTE — MAU Note (Signed)
Wynelle Bourgeois CNM in to check on pt. Aware pt refused Reglan due to making her "feel funny"

## 2021-11-27 DIAGNOSIS — O26892 Other specified pregnancy related conditions, second trimester: Secondary | ICD-10-CM

## 2021-11-27 DIAGNOSIS — Z3A14 14 weeks gestation of pregnancy: Secondary | ICD-10-CM | POA: Diagnosis not present

## 2021-11-27 DIAGNOSIS — G43809 Other migraine, not intractable, without status migrainosus: Secondary | ICD-10-CM | POA: Diagnosis not present

## 2021-11-27 DIAGNOSIS — R519 Headache, unspecified: Secondary | ICD-10-CM

## 2021-11-27 MED ORDER — OXYCODONE HCL 5 MG PO TABS: 10.0000 mg | ORAL_TABLET | Freq: Once | ORAL | Status: DC

## 2021-11-27 MED ORDER — ONDANSETRON 4 MG PO TBDP
4.0000 mg | ORAL_TABLET | Freq: Four times a day (QID) | ORAL | 0 refills | Status: DC | PRN
Start: 2021-11-27 — End: 2022-01-04

## 2021-11-27 MED ORDER — CYCLOBENZAPRINE HCL 5 MG PO TABS
10.0000 mg | ORAL_TABLET | Freq: Once | ORAL | Status: DC
Start: 2021-11-27 — End: 2021-11-27

## 2021-11-27 MED ORDER — ONDANSETRON HCL 4 MG/2ML IJ SOLN
4.0000 mg | Freq: Once | INTRAMUSCULAR | Status: AC
  Administered 2021-11-27: 4 mg via INTRAVENOUS
  Filled 2021-11-27: qty 2

## 2021-11-27 MED ORDER — OXYCODONE-ACETAMINOPHEN 5-325 MG PO TABS
2.0000 | ORAL_TABLET | Freq: Four times a day (QID) | ORAL | Status: DC | PRN
  Administered 2021-11-27: 2 via ORAL
  Filled 2021-11-27: qty 2

## 2021-11-27 NOTE — Progress Notes (Signed)
Written and verbal d/c instructions given and understanding voiced. 

## 2021-11-30 ENCOUNTER — Encounter (HOSPITAL_COMMUNITY): Payer: Self-pay | Admitting: Family Medicine

## 2021-11-30 ENCOUNTER — Inpatient Hospital Stay (HOSPITAL_COMMUNITY)
Admission: AD | Admit: 2021-11-30 | Discharge: 2021-11-30 | Disposition: A | Discharge status: Home / Self Care | Payer: Medicaid Other | Attending: Family Medicine | Admitting: Family Medicine

## 2021-11-30 DIAGNOSIS — G8929 Other chronic pain: Secondary | ICD-10-CM | POA: Diagnosis not present

## 2021-11-30 DIAGNOSIS — Z3A15 15 weeks gestation of pregnancy: Secondary | ICD-10-CM | POA: Diagnosis not present

## 2021-11-30 DIAGNOSIS — R102 Pelvic and perineal pain: Secondary | ICD-10-CM | POA: Diagnosis not present

## 2021-11-30 DIAGNOSIS — O26892 Other specified pregnancy related conditions, second trimester: Secondary | ICD-10-CM | POA: Insufficient documentation

## 2021-11-30 DIAGNOSIS — Z98891 History of uterine scar from previous surgery: Secondary | ICD-10-CM

## 2021-11-30 DIAGNOSIS — O99352 Diseases of the nervous system complicating pregnancy, second trimester: Secondary | ICD-10-CM | POA: Diagnosis present

## 2021-11-30 DIAGNOSIS — F1120 Opioid dependence, uncomplicated: Secondary | ICD-10-CM | POA: Diagnosis not present

## 2021-11-30 DIAGNOSIS — O0990 Supervision of high risk pregnancy, unspecified, unspecified trimester: Secondary | ICD-10-CM

## 2021-11-30 DIAGNOSIS — O99322 Drug use complicating pregnancy, second trimester: Secondary | ICD-10-CM | POA: Insufficient documentation

## 2021-11-30 LAB — URINALYSIS, ROUTINE W REFLEX MICROSCOPIC
Bilirubin Urine: NEGATIVE
Glucose, UA: NEGATIVE mg/dL
Hgb urine dipstick: NEGATIVE
Ketones, ur: NEGATIVE mg/dL
Leukocytes,Ua: NEGATIVE
Nitrite: NEGATIVE
Protein, ur: NEGATIVE mg/dL
Specific Gravity, Urine: 1.025 (ref 1.005–1.030)
pH: 5 (ref 5.0–8.0)

## 2021-11-30 MED ORDER — BUPRENORPHINE HCL-NALOXONE HCL 8-2 MG SL SUBL
1.0000 | SUBLINGUAL_TABLET | Freq: Once | SUBLINGUAL | Status: AC
  Administered 2021-11-30: 1 via SUBLINGUAL
  Filled 2021-11-30: qty 1

## 2021-11-30 NOTE — MAU Note (Signed)
Pt back notified Dr. Crissie Reese.
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
## 2021-11-30 NOTE — MAU Note (Signed)
Pt stated she had to leave for a few minutes to go clock out at her job. I told her that we could not hood a room and it would be like signing out AMA Pt still left and I informed Dr. Crissie Reese.

## 2021-11-30 NOTE — MAU Note (Signed)
.  Chloe Rodgers is a 31 y.o. at [redacted]w[redacted]d here in MAU reporting: chronic pelvic pain that has gotten worse today. Called office and Dr. Dione Plover told her to come in. Denies any vag bleeding or discharge.  LMP:  Onset of comp9/10  Pain score:  Vitals:   11/30/21 1155  BP: 132/79  Pulse: 97  Resp: 18  Temp: 98.8 F (37.1 C)     FHT: Lab orders placed from triage:

## 2021-11-30 NOTE — Discharge Instructions (Addendum)
You were seen in [place] for your chronic pain.   We started you on suboxone 8 mg once a day. We will follow up with you in clinic tomorrow. At that time we will assess if you need to go up on the dose or keep it the same.  You should NOT take your other pain meds anymore.   Please contact your regular pain doctor and let them know about this change.

## 2021-11-30 NOTE — MAU Note (Signed)
PT reports good relief with suboxone. Notified Dr. Crissie Reese

## 2021-12-01 ENCOUNTER — Other Ambulatory Visit (HOSPITAL_COMMUNITY)
Admission: RE | Admit: 2021-12-01 | Discharge: 2021-12-01 | Disposition: A | Discharge status: Home / Self Care | Payer: Medicaid Other | Source: Ambulatory Visit | Attending: Family Medicine | Admitting: Family Medicine

## 2021-12-01 ENCOUNTER — Encounter: Payer: Self-pay | Admitting: Family Medicine

## 2021-12-01 ENCOUNTER — Ambulatory Visit (INDEPENDENT_AMBULATORY_CARE_PROVIDER_SITE_OTHER): Payer: Medicaid Other | Admitting: Family Medicine

## 2021-12-01 VITALS — BP 131/84 | HR 96 | Wt 248.8 lb

## 2021-12-01 DIAGNOSIS — O0990 Supervision of high risk pregnancy, unspecified, unspecified trimester: Secondary | ICD-10-CM

## 2021-12-01 DIAGNOSIS — N898 Other specified noninflammatory disorders of vagina: Secondary | ICD-10-CM

## 2021-12-01 DIAGNOSIS — Z98891 History of uterine scar from previous surgery: Secondary | ICD-10-CM

## 2021-12-01 DIAGNOSIS — O26892 Other specified pregnancy related conditions, second trimester: Secondary | ICD-10-CM | POA: Insufficient documentation

## 2021-12-01 DIAGNOSIS — O99210 Obesity complicating pregnancy, unspecified trimester: Secondary | ICD-10-CM

## 2021-12-01 DIAGNOSIS — R300 Dysuria: Secondary | ICD-10-CM

## 2021-12-01 DIAGNOSIS — F1120 Opioid dependence, uncomplicated: Secondary | ICD-10-CM

## 2021-12-01 DIAGNOSIS — N731 Chronic parametritis and pelvic cellulitis: Secondary | ICD-10-CM

## 2021-12-01 DIAGNOSIS — G40909 Epilepsy, unspecified, not intractable, without status epilepticus: Secondary | ICD-10-CM

## 2021-12-01 LAB — POCT URINALYSIS DIP (DEVICE)
Bilirubin Urine: NEGATIVE
Glucose, UA: NEGATIVE mg/dL
Ketones, ur: NEGATIVE mg/dL
Leukocytes,Ua: NEGATIVE
Nitrite: NEGATIVE
Protein, ur: NEGATIVE mg/dL
Specific Gravity, Urine: >=1.03 (ref 1.005–1.030)
Urobilinogen, UA: 0.2 mg/dL (ref 0.0–1.0)
pH: 6 (ref 5.0–8.0)

## 2021-12-01 MED ORDER — BUPRENORPHINE HCL-NALOXONE HCL 8-2 MG SL FILM: 1.0000 | ORAL_FILM | Freq: Every day | SUBLINGUAL | 0 refills | Status: DC

## 2021-12-01 NOTE — Progress Notes (Signed)
   Subjective:  Chloe Rodgers is a 31 y.o. [phone] at [redacted]w[redacted]d being seen today for ongoing prenatal care.  She is currently monitored for the following issues for this high-risk pregnancy and has Seizure disorder ([place]); Family history of breast cancer in mother; Obese; Migraines; Nephrolithiasis; Chronic PID (chronic pelvic inflammatory disease); Family history of breast cancer; Family history of colon cancer; Genetic testing; Supervision of high risk pregnancy, antepartum; Uncomplicated opioid dependence ([place]); Obesity in pregnancy, antepartum; and History of cesarean section on their problem list.  Patient reports  dysuria, vaginal irritation .  Contractions: Not present. Vag. Bleeding: None.  Movement: Present. Denies leaking of fluid.   The following portions of the patient's history were reviewed and updated as appropriate: allergies, current medications, past family history, past medical history, past social history, past surgical history and problem list. Problem list updated.  Objective:   Vitals:   12/01/21 0902  BP: 131/84  Pulse: 96  Weight: 248 lb 12.8 oz (112.9 kg)    Fetal Status: Fetal Heart Rate (bpm): 149   Movement: Present     General:  Alert, oriented and cooperative. Patient is in no acute distress.  Skin: Skin is warm and dry. No rash noted.   Cardiovascular: Normal heart rate noted  Respiratory: Normal respiratory effort, no problems with respiration noted  Abdomen: Soft, gravid, appropriate for gestational age. Pain/Pressure: Present     Pelvic: Vag. Bleeding: None Vag D/C Character: White   Cervical exam deferred        Extremities: Normal range of motion.     Mental Status: Normal mood and affect. Normal behavior. Normal judgment and thought content.   Urinalysis:      Assessment and Plan:  Pregnancy: [id number] at [redacted]w[redacted]d  1. Supervision of high risk pregnancy, antepartum BP and FHR normal AFP today Also needs antibody screen and rubella screen today,  collected as well - AFP, Serum, Open Spina Bifida  2. Vaginal discharge during pregnancy in second trimester Self swab, OB urine culture - Cervicovaginal ancillary only( [place])  3. Uncomplicated opioid dependence ([place]) Seen in MAU yesterday, switched from oxycodone to suboxone Given single dose of 8 mg with improvement Today reports felt much better all day yesterday Rx sent for 3 weeks of suboxone 8 mg once daily UDS today  4. Seizure disorder ([place]) No meds, no showed appt earlier this month  5. Chronic PID (chronic pelvic inflammatory disease) On chronic opioids, see above  6. Obesity in pregnancy, antepartum   7. History of cesarean section X3, needs RCS Also interested in BTL  Preterm labor symptoms and general obstetric precautions including but not limited to vaginal bleeding, contractions, leaking of fluid and fetal movement were reviewed in detail with the patient. Please refer to After Visit Summary for other counseling recommendations.  Return in 2 weeks (on 12/15/2021) for [facility], ob visit, needs MD.   Clarnce Flock, MD

## 2021-12-01 NOTE — Patient Instructions (Addendum)
Call [facility] to reschedule your appointment  [facility] [address] [phone] [address] [place],  [place]  [address] Get Driving Directions Main: [phone]    Contraception Choices Contraception, also called birth control, refers to methods or devices that prevent pregnancy. Hormonal methods  Contraceptive implant A contraceptive implant is a thin, plastic tube that contains a hormone that prevents pregnancy. It is different from an intrauterine device (IUD). It is inserted into the upper part of the arm by a health care provider. Implants can be effective for up to 3 years. Progestin-only injections Progestin-only injections are injections of progestin, a synthetic form of the hormone progesterone. They are given every 3 months by a health care provider. Birth control pills Birth control pills are pills that contain hormones that prevent pregnancy. They must be taken once a day, preferably at the same time each day. A prescription is needed to use this method of contraception. Birth control patch The birth control patch contains hormones that prevent pregnancy. It is placed on the skin and must be changed once a week for three weeks and removed on the fourth week. A prescription is needed to use this method of contraception. Vaginal ring A vaginal ring contains hormones that prevent pregnancy. It is placed in the vagina for three weeks and removed on the fourth week. After that, the process is repeated with a new ring. A prescription is needed to use this method of contraception. Emergency contraceptive Emergency contraceptives prevent pregnancy after unprotected sex. They come in pill form and can be taken up to 5 days after sex. They work best the sooner they are taken after having sex. Most emergency contraceptives are available without a prescription. This method should not be used as your only form of birth control. Barrier methods  Female  condom A female condom is a thin sheath that is worn over the penis during sex. Condoms keep sperm from going inside a woman's body. They can be used with a sperm-killing substance (spermicide) to increase their effectiveness. They should be thrown away after one use. Female condom A female condom is a soft, loose-fitting sheath that is put into the vagina before sex. The condom keeps sperm from going inside a woman's body. They should be thrown away after one use. Diaphragm A diaphragm is a soft, dome-shaped barrier. It is inserted into the vagina before sex, along with a spermicide. The diaphragm blocks sperm from entering the uterus, and the spermicide kills sperm. A diaphragm should be left in the vagina for 6-8 hours after sex and removed within 24 hours. A diaphragm is prescribed and fitted by a health care provider. A diaphragm should be replaced every 1-2 years, after giving birth, after gaining more than 15 lb (6.8 kg), and after pelvic surgery. Cervical cap A cervical cap is a round, soft latex or plastic cup that fits over the cervix. It is inserted into the vagina before sex, along with spermicide. It blocks sperm from entering the uterus. The cap should be left in place for 6-8 hours after sex and removed within 48 hours. A cervical cap must be prescribed and fitted by a health care provider. It should be replaced every 2 years. Sponge A sponge is a soft, circular piece of polyurethane foam with spermicide in it. The sponge helps block sperm from entering the uterus, and the spermicide kills sperm. To use it, you make it wet and then insert it into the vagina. It should be inserted  before sex, left in for at least 6 hours after sex, and removed and thrown away within 30 hours. Spermicides Spermicides are chemicals that kill or block sperm from entering the cervix and uterus. They can come as a cream, jelly, suppository, foam, or tablet. A spermicide should be inserted into the vagina with an  applicator at least 10-15 minutes before sex to allow time for it to work. The process must be repeated every time you have sex. Spermicides do not require a prescription. Intrauterine contraception Intrauterine device (IUD) An IUD is a T-shaped device that is put in a woman's uterus. There are two types: Hormone IUD.This type contains progestin, a synthetic form of the hormone progesterone. This type can stay in place for 3-5 years. Copper IUD.This type is wrapped in copper wire. It can stay in place for 10 years. Permanent methods of contraception Female tubal ligation In this method, a woman's fallopian tubes are sealed, tied, or blocked during surgery to prevent eggs from traveling to the uterus. Hysteroscopic sterilization In this method, a small, flexible insert is placed into each fallopian tube. The inserts cause scar tissue to form in the fallopian tubes and block them, so sperm cannot reach an egg. The procedure takes about 3 months to be effective. Another form of birth control must be used during those 3 months. Female sterilization This is a procedure to tie off the tubes that carry sperm (vasectomy). After the procedure, the man can still ejaculate fluid (semen). Another form of birth control must be used for 3 months after the procedure. Natural planning methods Natural family planning In this method, a couple does not have sex on days when the woman could become pregnant. Calendar method In this method, the woman keeps track of the length of each menstrual cycle, identifies the days when pregnancy can happen, and does not have sex on those days. Ovulation method In this method, a couple avoids sex during ovulation. Symptothermal method This method involves not having sex during ovulation. The woman typically checks for ovulation by watching changes in her temperature and in the consistency of cervical mucus. Post-ovulation method In this method, a couple waits to have sex until  after ovulation. Where to find more information Centers for Disease Control and Prevention: [employer] Summary Contraception, also called birth control, refers to methods or devices that prevent pregnancy. Hormonal methods of contraception include implants, injections, pills, patches, vaginal rings, and emergency contraceptives. Barrier methods of contraception can include female condoms, female condoms, diaphragms, cervical caps, sponges, and spermicides. There are two types of IUDs (intrauterine devices). An IUD can be put in a woman's uterus to prevent pregnancy for 3-5 years. Permanent sterilization can be done through a procedure for males and females. Natural family planning methods involve nothaving sex on days when the woman could become pregnant. This information is not intended to replace advice given to you by your health care provider. Make sure you discuss any questions you have with your health care provider. Document Revised: 12/03/2019 Document Reviewed: 12/03/2019 Elsevier Patient Education  2023 [employer].

## 2021-12-02 LAB — CERVICOVAGINAL ANCILLARY ONLY
Bacterial Vaginitis (gardnerella): POSITIVE — AB
Candida Glabrata: NEGATIVE
Candida Vaginitis: NEGATIVE
Chlamydia: NEGATIVE
Comment: NEGATIVE
Comment: NEGATIVE
Comment: NEGATIVE
Comment: NEGATIVE
Comment: NEGATIVE
Comment: NORMAL
Neisseria Gonorrhea: NEGATIVE
Trichomonas: NEGATIVE

## 2021-12-02 LAB — RUBELLA SCREEN: Rubella Antibodies, IGG: 2.58 index (ref >0.99)

## 2021-12-02 LAB — ANTIBODY SCREEN: Antibody Screen: NEGATIVE

## 2021-12-02 MED ORDER — METRONIDAZOLE 500 MG PO TABS: 500.0000 mg | ORAL_TABLET | Freq: Two times a day (BID) | ORAL | 0 refills | Status: AC

## 2021-12-02 NOTE — Addendum Note (Signed)
Addended by: Merian Capron on: 12/02/2021 01:36 PM   Modules accepted: Orders

## 2021-12-03 LAB — AFP, SERUM, OPEN SPINA BIFIDA
AFP MoM: 1.27
AFP Value: 32 ng/mL
Gest. Age on Collection Date: 15.2 weeks
Maternal Age At EDD: 31.8 yr
OSBR Risk 1 IN: 10000
Test Results:: NEGATIVE
Weight: 249 [lb_av]

## 2021-12-04 LAB — TOXASSURE SELECT 13 (MW), URINE

## 2021-12-05 LAB — URINE CULTURE, OB REFLEX

## 2021-12-05 LAB — CULTURE, OB URINE

## 2021-12-08 ENCOUNTER — Encounter: Payer: Self-pay | Admitting: Family Medicine

## 2021-12-08 DIAGNOSIS — R8271 Bacteriuria: Secondary | ICD-10-CM | POA: Insufficient documentation

## 2021-12-08 LAB — OB RESULTS CONSOLE GBS: GBS: NEGATIVE

## 2021-12-08 MED ORDER — CEFADROXIL 500 MG PO CAPS: 500.0000 mg | ORAL_CAPSULE | Freq: Two times a day (BID) | ORAL | 0 refills | Status: AC

## 2021-12-08 NOTE — Addendum Note (Signed)
Addended by: Merian Capron on: 12/08/2021 08:26 AM   Modules accepted: Orders

## 2021-12-09 ENCOUNTER — Telehealth: Payer: Self-pay | Admitting: Pediatrics

## 2021-12-09 ENCOUNTER — Encounter: Payer: Self-pay | Admitting: Family Medicine

## 2021-12-10 ENCOUNTER — Other Ambulatory Visit: Payer: Self-pay | Admitting: Family Medicine

## 2021-12-10 DIAGNOSIS — F1120 Opioid dependence, uncomplicated: Secondary | ICD-10-CM

## 2021-12-10 MED ORDER — BUPRENORPHINE HCL-NALOXONE HCL 8-2 MG SL FILM: ORAL_FILM | SUBLINGUAL | 0 refills | Status: DC

## 2021-12-10 NOTE — Progress Notes (Signed)
Patient messaged stating she is feeling withdrawal symptoms in the evening. Will add additional 4 mg in the evening and see if this improves her symptoms.

## 2021-12-11 ENCOUNTER — Telehealth: Payer: Self-pay | Admitting: Neonatology

## 2021-12-16 ENCOUNTER — Encounter: Payer: Medicaid Other | Admitting: Family Medicine

## 2021-12-18 ENCOUNTER — Encounter: Payer: Self-pay | Admitting: Family Medicine

## 2021-12-18 ENCOUNTER — Encounter: Payer: Medicaid Other | Admitting: Family Medicine

## 2021-12-18 ENCOUNTER — Ambulatory Visit (INDEPENDENT_AMBULATORY_CARE_PROVIDER_SITE_OTHER): Payer: Medicaid Other | Admitting: Family Medicine

## 2021-12-18 VITALS — BP 131/83 | HR 97 | Wt 256.9 lb

## 2021-12-18 DIAGNOSIS — O0990 Supervision of high risk pregnancy, unspecified, unspecified trimester: Secondary | ICD-10-CM

## 2021-12-18 DIAGNOSIS — Z98891 History of uterine scar from previous surgery: Secondary | ICD-10-CM

## 2021-12-18 DIAGNOSIS — G40909 Epilepsy, unspecified, not intractable, without status epilepticus: Secondary | ICD-10-CM

## 2021-12-18 DIAGNOSIS — F419 Anxiety disorder, unspecified: Secondary | ICD-10-CM

## 2021-12-18 DIAGNOSIS — F1120 Opioid dependence, uncomplicated: Secondary | ICD-10-CM

## 2021-12-18 DIAGNOSIS — N200 Calculus of kidney: Secondary | ICD-10-CM

## 2021-12-18 DIAGNOSIS — F32A Depression, unspecified: Secondary | ICD-10-CM

## 2021-12-18 DIAGNOSIS — O99210 Obesity complicating pregnancy, unspecified trimester: Secondary | ICD-10-CM

## 2021-12-18 DIAGNOSIS — R8271 Bacteriuria: Secondary | ICD-10-CM

## 2021-12-18 NOTE — Addendum Note (Signed)
Addended by: Brita Romp E on: 12/18/2021 10:42 AM   Modules accepted: Orders

## 2021-12-18 NOTE — Progress Notes (Signed)
Subjective:  Chloe Rodgers is a 31 y.o. [phone] at [redacted]w[redacted]d being seen today for ongoing prenatal care.  She is currently monitored for the following issues for this high-risk pregnancy and has Seizure disorder ([place]); Family history of breast cancer in mother; Obese; Migraines; Nephrolithiasis; Chronic PID (chronic pelvic inflammatory disease); Family history of breast cancer; Family history of colon cancer; Genetic testing; Supervision of high risk pregnancy, antepartum; Uncomplicated opioid dependence ([place]); Obesity in pregnancy, antepartum; History of cesarean section; and GBS bacteriuria on their problem list.  Patient reports no complaints.  Contractions: Not present. Vag. Bleeding: None.  Movement: Present. Denies leaking of fluid.   The following portions of the patient's history were reviewed and updated as appropriate: allergies, current medications, past family history, past medical history, past social history, past surgical history and problem list. Problem list updated.  Objective:   Vitals:   12/18/21 0855  BP: 131/83  Pulse: 97  Weight: 256 lb 14.4 oz (116.5 kg)    Fetal Status: Fetal Heart Rate (bpm): 150   Movement: Present     General:  Alert, oriented and cooperative. Patient is in no acute distress.  Skin: Skin is warm and dry. No rash noted.   Cardiovascular: Normal heart rate noted  Respiratory: Normal respiratory effort, no problems with respiration noted  Abdomen: Soft, gravid, appropriate for gestational age. Pain/Pressure: Present     Pelvic: Vag. Bleeding: None     Cervical exam deferred        Extremities: Normal range of motion.  Edema: Trace  Mental Status: Normal mood and affect. Normal behavior. Normal judgment and thought content.   Urinalysis:      Assessment and Plan:  Pregnancy: [id number] at [redacted]w[redacted]d  1. Supervision of high risk pregnancy, antepartum BP and FHR normal  2. Uncomplicated opioid dependence ([place]) Previously on chronic opioids,  switched to suboxone in MAU two weeks prior Discussed UDS from last visit with small amount of fentanyl in it, she became very teary and reported when she was going through withdrawal she bought pills that she thought were percocets but seems like they were fake and had fentanyl instead Thanked patient for her honesty, reiterated that UDS are not used punitively, but instead to help counsel patients and do harm reduction Today reports she is running out of films because she already increased herself to 8 qam and 4 qpm of suboxone but can not get refill until Monday I asked her to try and space out her remaining suboxone, and if she is in significant withdrawal to please go to the MAU to get dosed She also reports she saw her pain clinic prescriber who put in a new suboxone rx and cancelled my last one I told her that it was not ideal to be seeing two different providers for this, she states the other provider said the same thing I asked her to please pick one location. I am happy to assume prescribing, particularly since in my experience pregnant patients are often not prioritized and have difficulty getting refills She will discuss with outside provider Also told her I am happy to continue seeing her up to one year postpartum and then referring her back to outside clinic UDS collected today with verbal consent, does not anticipate any discrepancies - ToxASSURE Select 13 ([facility]), Urine  3. History of cesarean section X3, needs repeat Interested in [place]  4. GBS bacteriuria PPX in labor  5. Obesity in pregnancy, antepartum   6. Nephrolithiasis Not an issue at  present  7. Seizure disorder ([place]) No meds, no showed appt at beginning of May Encouraged to contact them  Preterm labor symptoms and general obstetric precautions including but not limited to vaginal bleeding, contractions, leaking of fluid and fetal movement were reviewed in detail with the patient. Please refer to After Visit Summary  for other counseling recommendations.  Return in 4 weeks (on 01/15/2022) for [facility], ob visit, needs MD.   Clarnce Flock, MD

## 2021-12-18 NOTE — Patient Instructions (Signed)

## 2021-12-21 ENCOUNTER — Encounter: Payer: Self-pay | Admitting: Family Medicine

## 2021-12-25 LAB — TOXASSURE SELECT 13 (MW), URINE

## 2021-12-29 ENCOUNTER — Ambulatory Visit: Payer: Medicaid Other | Admitting: *Deleted

## 2021-12-29 ENCOUNTER — Ambulatory Visit: Payer: Medicaid Other | Attending: Medical

## 2021-12-29 ENCOUNTER — Other Ambulatory Visit: Payer: Self-pay | Admitting: *Deleted

## 2021-12-29 ENCOUNTER — Encounter: Payer: Self-pay | Admitting: *Deleted

## 2021-12-29 VITALS — BP 109/73 | HR 107

## 2021-12-29 DIAGNOSIS — Z98891 History of uterine scar from previous surgery: Secondary | ICD-10-CM | POA: Insufficient documentation

## 2021-12-29 DIAGNOSIS — O0990 Supervision of high risk pregnancy, unspecified, unspecified trimester: Secondary | ICD-10-CM | POA: Diagnosis present

## 2021-12-29 DIAGNOSIS — G40909 Epilepsy, unspecified, not intractable, without status epilepticus: Secondary | ICD-10-CM

## 2021-12-29 DIAGNOSIS — F1120 Opioid dependence, uncomplicated: Secondary | ICD-10-CM

## 2021-12-29 DIAGNOSIS — O99322 Drug use complicating pregnancy, second trimester: Secondary | ICD-10-CM

## 2021-12-29 DIAGNOSIS — Z6837 Body mass index (BMI) 37.0-37.9, adult: Secondary | ICD-10-CM

## 2021-12-29 DIAGNOSIS — O99352 Diseases of the nervous system complicating pregnancy, second trimester: Secondary | ICD-10-CM

## 2021-12-29 DIAGNOSIS — F1190 Opioid use, unspecified, uncomplicated: Secondary | ICD-10-CM

## 2021-12-29 DIAGNOSIS — Z3A19 19 weeks gestation of pregnancy: Secondary | ICD-10-CM

## 2021-12-29 DIAGNOSIS — F1920 Other psychoactive substance dependence, uncomplicated: Secondary | ICD-10-CM

## 2021-12-29 DIAGNOSIS — O34219 Maternal care for unspecified type scar from previous cesarean delivery: Secondary | ICD-10-CM

## 2022-01-04 MED ORDER — ONDANSETRON 4 MG PO TBDP: 4.0000 mg | ORAL_TABLET | Freq: Four times a day (QID) | ORAL | 0 refills | Status: DC | PRN

## 2022-01-21 ENCOUNTER — Other Ambulatory Visit (HOSPITAL_COMMUNITY)
Admission: RE | Admit: 2022-01-21 | Discharge: 2022-01-21 | Disposition: A | Discharge status: Home / Self Care | Payer: Medicaid Other | Source: Ambulatory Visit | Attending: Obstetrics & Gynecology | Admitting: Obstetrics & Gynecology

## 2022-01-21 ENCOUNTER — Other Ambulatory Visit: Payer: Self-pay

## 2022-01-21 ENCOUNTER — Ambulatory Visit (INDEPENDENT_AMBULATORY_CARE_PROVIDER_SITE_OTHER): Payer: Medicaid Other | Admitting: Obstetrics & Gynecology

## 2022-01-21 VITALS — BP 125/76 | HR 105 | Wt 256.8 lb

## 2022-01-21 DIAGNOSIS — Z3A22 22 weeks gestation of pregnancy: Secondary | ICD-10-CM

## 2022-01-21 DIAGNOSIS — O0990 Supervision of high risk pregnancy, unspecified, unspecified trimester: Secondary | ICD-10-CM

## 2022-01-21 DIAGNOSIS — O99212 Obesity complicating pregnancy, second trimester: Secondary | ICD-10-CM

## 2022-01-21 DIAGNOSIS — Z98891 History of uterine scar from previous surgery: Secondary | ICD-10-CM

## 2022-01-21 DIAGNOSIS — F1120 Opioid dependence, uncomplicated: Secondary | ICD-10-CM

## 2022-01-21 DIAGNOSIS — N898 Other specified noninflammatory disorders of vagina: Secondary | ICD-10-CM | POA: Insufficient documentation

## 2022-01-21 DIAGNOSIS — O0992 Supervision of high risk pregnancy, unspecified, second trimester: Secondary | ICD-10-CM

## 2022-01-21 DIAGNOSIS — G40909 Epilepsy, unspecified, not intractable, without status epilepticus: Secondary | ICD-10-CM

## 2022-01-21 DIAGNOSIS — O99210 Obesity complicating pregnancy, unspecified trimester: Secondary | ICD-10-CM

## 2022-01-21 NOTE — Progress Notes (Signed)
..    PRENATAL VISIT NOTE  Subjective:  Chloe Rodgers is a 31 y.o. [phone] at [redacted]w[redacted]d being seen today for ongoing prenatal care.  She is currently monitored for the following issues for this high-risk pregnancy and has Seizure disorder ([facility]CC); Family history of breast cancer in mother; Obese; Migraines; Nephrolithiasis; Chronic PID (chronic pelvic inflammatory disease); Family history of breast cancer; Family history of colon cancer; Genetic testing; Supervision of high risk pregnancy, antepartum; Uncomplicated opioid dependence ([facility]); Obesity in pregnancy, antepartum; History of cesarean section; and GBS bacteriuria on their problem list.  Patient reports some pressure and pain in pelvic region.  Contractions: Not present. Vag. Bleeding: None.  Movement: Present. Reports some vaginal discharge that is clear and white in color along with vaginal itching.   The following portions of the patient's history were reviewed and updated as appropriate: allergies, current medications, past family history, past medical history, past social history, past surgical history and problem list.   Objective:   Vitals:   01/21/22 1502  BP: 125/76  Pulse: (!) 105  Weight: 256 lb 12.8 oz (116.5 kg)    Fetal Status: Fetal Heart Rate (bpm): 143   Movement: Present     General:  Alert, oriented and cooperative. Patient is in no acute distress.  Skin: Skin is warm and dry. No rash noted.   Cardiovascular: Normal heart rate noted  Respiratory: Normal respiratory effort, no problems with respiration noted  Abdomen: Soft, gravid, appropriate for gestational age.      Pelvic: Cervical exam deferred        Extremities: Normal range of motion.    Mental Status: Normal mood and affect. Normal behavior. Normal judgment and thought content.   Assessment and Plan:  Pregnancy: [id number] at 107w4d  1. Vaginal itching - Self swab - Cervicovaginal ancillary only( [place])  2. Seizure disorder (HCC) - On no  medications  3. Obesity in pregnancy, antepartum   4. History of cesarean section -X3, needs RCS Also interested in BTL  5. Uncomplicated opioid dependence ([facility]) - Seen by pain specialist. Next appt in late July.   6. Supervision of high risk pregnancy, antepartum BP normal today and FHR normal on last U/S 6/28  Preterm labor symptoms and general obstetric precautions including but not limited to vaginal bleeding, contractions, leaking of fluid and fetal movement were reviewed in detail with the patient. Please refer to After Visit Summary for other counseling recommendations.   No follow-ups on file.  Future Appointments  Date Time Provider Department Center  01/26/2022  8:30 AM [facility] NURSE [facility]  01/26/2022  8:45 AM [facility] US6 [facility]    Joyice Faster, Student-PA

## 2022-01-21 NOTE — Progress Notes (Signed)
Pt reported having a lot of pressure w/ pain. Some itching & burning in vaginal area.

## 2022-01-21 NOTE — Progress Notes (Signed)
   PRENATAL VISIT NOTE  Subjective:  Chloe Rodgers is a 31 y.o. [phone] at [redacted]w[redacted]d being seen today for ongoing prenatal care.  She is currently monitored for the following issues for this high-risk pregnancy and has Seizure disorder (HCC); Family history of breast cancer in mother; Obese; Migraines; Nephrolithiasis; Chronic PID (chronic pelvic inflammatory disease); Family history of breast cancer; Family history of colon cancer; Genetic testing; Supervision of high risk pregnancy, antepartum; Uncomplicated opioid dependence (HCC); Obesity in pregnancy, antepartum; History of cesarean section; and GBS bacteriuria on their problem list.  Patient reports  pelvic pressure .  Contractions: Not present. Vag. Bleeding: None.  Movement: Present. Denies leaking of fluid.   The following portions of the patient's history were reviewed and updated as appropriate: allergies, current medications, past family history, past medical history, past social history, past surgical history and problem list.   Objective:   Vitals:   01/21/22 1502  BP: 125/76  Pulse: (!) 105  Weight: 256 lb 12.8 oz (116.5 kg)    Fetal Status: Fetal Heart Rate (bpm): 143   Movement: Present     General:  Alert, oriented and cooperative. Patient is in no acute distress.  Skin: Skin is warm and dry. No rash noted.   Cardiovascular: Normal heart rate noted  Respiratory: Normal respiratory effort, no problems with respiration noted  Abdomen: Soft, gravid, appropriate for gestational age.  Pain/Pressure: Present     Pelvic: Cervical exam deferred        Extremities: Normal range of motion.  Edema: Trace  Mental Status: Normal mood and affect. Normal behavior. Normal judgment and thought content.   Assessment and Plan:  Pregnancy: [id number] at [redacted]w[redacted]d 1. Vaginal itching Possible vaginal yeast - Cervicovaginal ancillary only( [place])  2. Seizure disorder (HCC) controlled  3. Obesity in pregnancy, antepartum Body mass index  is 41.45 kg/m.   4. History of cesarean section Repeat and BTL at 39 weeks  5. Uncomplicated opioid dependence (HCC) Suboxone  6. Supervision of high risk pregnancy, antepartum   Preterm labor symptoms and general obstetric precautions including but not limited to vaginal bleeding, contractions, leaking of fluid and fetal movement were reviewed in detail with the patient. Please refer to After Visit Summary for other counseling recommendations.   Return in about 4 weeks (around 02/18/2022).  Future Appointments  Date Time Provider Department Center  01/26/2022  8:30 AM [facility] NURSE [facility]  01/26/2022  8:45 AM [facility] US6 [facility]  02/18/2022  8:55 AM Hermina Staggers, MD [facility]  02/18/2022  9:30 AM [facility] LAB [facility]    Scheryl Darter, MD

## 2022-01-26 ENCOUNTER — Ambulatory Visit: Payer: Medicaid Other | Admitting: *Deleted

## 2022-01-26 ENCOUNTER — Encounter: Payer: Self-pay | Admitting: *Deleted

## 2022-01-26 ENCOUNTER — Other Ambulatory Visit: Payer: Self-pay | Admitting: *Deleted

## 2022-01-26 ENCOUNTER — Ambulatory Visit: Payer: Medicaid Other | Attending: Obstetrics

## 2022-01-26 VITALS — BP 126/81 | HR 103

## 2022-01-26 DIAGNOSIS — G40909 Epilepsy, unspecified, not intractable, without status epilepticus: Secondary | ICD-10-CM

## 2022-01-26 DIAGNOSIS — O358XX0 Maternal care for other (suspected) fetal abnormality and damage, not applicable or unspecified: Secondary | ICD-10-CM | POA: Diagnosis not present

## 2022-01-26 DIAGNOSIS — Z3A23 23 weeks gestation of pregnancy: Secondary | ICD-10-CM

## 2022-01-26 DIAGNOSIS — O34219 Maternal care for unspecified type scar from previous cesarean delivery: Secondary | ICD-10-CM

## 2022-01-26 DIAGNOSIS — O0990 Supervision of high risk pregnancy, unspecified, unspecified trimester: Secondary | ICD-10-CM | POA: Insufficient documentation

## 2022-01-26 DIAGNOSIS — F1190 Opioid use, unspecified, uncomplicated: Secondary | ICD-10-CM

## 2022-01-26 DIAGNOSIS — O99322 Drug use complicating pregnancy, second trimester: Secondary | ICD-10-CM | POA: Insufficient documentation

## 2022-01-26 DIAGNOSIS — Z98891 History of uterine scar from previous surgery: Secondary | ICD-10-CM | POA: Diagnosis present

## 2022-01-26 DIAGNOSIS — O99352 Diseases of the nervous system complicating pregnancy, second trimester: Secondary | ICD-10-CM | POA: Insufficient documentation

## 2022-01-26 DIAGNOSIS — O99212 Obesity complicating pregnancy, second trimester: Secondary | ICD-10-CM

## 2022-01-26 DIAGNOSIS — Z6837 Body mass index (BMI) 37.0-37.9, adult: Secondary | ICD-10-CM | POA: Diagnosis present

## 2022-01-26 DIAGNOSIS — F1120 Opioid dependence, uncomplicated: Secondary | ICD-10-CM

## 2022-01-26 DIAGNOSIS — O99320 Drug use complicating pregnancy, unspecified trimester: Secondary | ICD-10-CM

## 2022-01-27 ENCOUNTER — Encounter: Payer: Self-pay | Admitting: Obstetrics & Gynecology

## 2022-01-27 LAB — CERVICOVAGINAL ANCILLARY ONLY
Bacterial Vaginitis (gardnerella): POSITIVE — AB
Candida Glabrata: NEGATIVE
Candida Vaginitis: NEGATIVE
Chlamydia: NEGATIVE
Comment: NEGATIVE
Comment: NEGATIVE
Comment: NEGATIVE
Comment: NEGATIVE
Comment: NEGATIVE
Comment: NORMAL
Neisseria Gonorrhea: NEGATIVE
Trichomonas: NEGATIVE

## 2022-01-28 ENCOUNTER — Telehealth: Payer: Self-pay | Admitting: Pediatrics

## 2022-01-29 MED ORDER — ONDANSETRON 4 MG PO TBDP: 4.0000 mg | ORAL_TABLET | Freq: Four times a day (QID) | ORAL | 0 refills | Status: DC | PRN

## 2022-01-29 MED ORDER — METRONIDAZOLE 500 MG PO TABS: 500.0000 mg | ORAL_TABLET | Freq: Two times a day (BID) | ORAL | 0 refills | Status: DC

## 2022-01-29 NOTE — Addendum Note (Signed)
Addended by: Isabell Jarvis on: 01/29/2022 08:59 AM   Modules accepted: Orders

## 2022-02-03 ENCOUNTER — Telehealth: Payer: Self-pay | Admitting: Pediatrics

## 2022-02-09 ENCOUNTER — Telehealth: Payer: Self-pay

## 2022-02-09 NOTE — Telephone Encounter (Signed)
Breast pump order received from [employer].   Order signed and faxed back.  Confirmation received 

## 2022-02-10 ENCOUNTER — Other Ambulatory Visit (HOSPITAL_COMMUNITY)
Admission: RE | Admit: 2022-02-10 | Discharge: 2022-02-10 | Disposition: A | Discharge status: Home / Self Care | Payer: Medicaid Other | Source: Ambulatory Visit | Attending: Obstetrics and Gynecology | Admitting: Obstetrics and Gynecology

## 2022-02-10 ENCOUNTER — Ambulatory Visit (INDEPENDENT_AMBULATORY_CARE_PROVIDER_SITE_OTHER): Payer: Medicaid Other | Admitting: Family Medicine

## 2022-02-10 ENCOUNTER — Encounter: Payer: Self-pay | Admitting: Family Medicine

## 2022-02-10 ENCOUNTER — Other Ambulatory Visit: Payer: Medicaid Other

## 2022-02-10 VITALS — BP 122/75 | HR 98 | Wt 260.9 lb

## 2022-02-10 DIAGNOSIS — O99210 Obesity complicating pregnancy, unspecified trimester: Secondary | ICD-10-CM

## 2022-02-10 DIAGNOSIS — G40909 Epilepsy, unspecified, not intractable, without status epilepticus: Secondary | ICD-10-CM

## 2022-02-10 DIAGNOSIS — F1120 Opioid dependence, uncomplicated: Secondary | ICD-10-CM

## 2022-02-10 DIAGNOSIS — O0990 Supervision of high risk pregnancy, unspecified, unspecified trimester: Secondary | ICD-10-CM

## 2022-02-10 DIAGNOSIS — R8271 Bacteriuria: Secondary | ICD-10-CM

## 2022-02-10 DIAGNOSIS — B9689 Other specified bacterial agents as the cause of diseases classified elsewhere: Secondary | ICD-10-CM

## 2022-02-10 DIAGNOSIS — Z98891 History of uterine scar from previous surgery: Secondary | ICD-10-CM

## 2022-02-10 DIAGNOSIS — N760 Acute vaginitis: Secondary | ICD-10-CM

## 2022-02-10 MED ORDER — METRONIDAZOLE 500 MG PO TABS: 500.0000 mg | ORAL_TABLET | Freq: Two times a day (BID) | ORAL | 0 refills | Status: DC

## 2022-02-10 NOTE — Progress Notes (Signed)
   Subjective:  Chloe Rodgers is a 31 y.o. [phone] at [redacted]w[redacted]d being seen today for ongoing prenatal care.  She is currently monitored for the following issues for this high-risk pregnancy and has Seizure disorder (HCC); Family history of breast cancer in mother; Obese; Migraines; Nephrolithiasis; Chronic PID (chronic pelvic inflammatory disease); Family history of breast cancer; Family history of colon cancer; Genetic testing; Supervision of high risk pregnancy, antepartum; Uncomplicated opioid dependence (HCC); Obesity in pregnancy, antepartum; History of cesarean section; and GBS bacteriuria on their problem list.  Patient reports  leaking of fluid, concerned her water may have broken .  Contractions: Not present. Vag. Bleeding: None.  Movement: Present.   The following portions of the patient's history were reviewed and updated as appropriate: allergies, current medications, past family history, past medical history, past social history, past surgical history and problem list. Problem list updated.  Objective:   Vitals:   02/10/22 1025  BP: 122/75  Pulse: 98  Weight: 260 lb 14.4 oz (118.3 kg)    Fetal Status: Fetal Heart Rate (bpm): 141   Movement: Present     General:  Alert, oriented and cooperative. Patient is in no acute distress.  Skin: Skin is warm and dry. No rash noted.   Cardiovascular: Normal heart rate noted  Respiratory: Normal respiratory effort, no problems with respiration noted  Abdomen: Soft, gravid, appropriate for gestational age. Pain/Pressure: Absent     Pelvic: Vag. Bleeding: None     SSE: no pooling, +whiff test         Extremities: Normal range of motion.  Edema: Trace  Mental Status: Normal mood and affect. Normal behavior. Normal judgment and thought content.   Urinalysis:      Assessment and Plan:  Pregnancy: [id number] at [redacted]w[redacted]d  1. Supervision of high risk pregnancy, antepartum BP and FHR normal Reports she has been leaking, especially at night, for a  few days No large gush Speculum exam shows no pooling and neg fern on slide, numerous clue cells and +whiff test, rx sent for flagyl and swab collected - Cervicovaginal ancillary only( [place])  2. Uncomplicated opioid dependence (HCC) Sees outside provider, up to 20 mg daily UDS today, last one was appropriate - ToxASSURE Select 13 ([place]), Urine  3. GBS bacteriuria Ppx yin labor  4. History of cesarean section X3, plan for RCS+BTL at 39 weeks  5. Seizure disorder (HCC) No meds No showed appt in May Encouraged to reschedule  6. Obesity in pregnancy, antepartum   7. Unwanted fertility BTL papers signed today   Preterm labor symptoms and general obstetric precautions including but not limited to vaginal bleeding, contractions, leaking of fluid and fetal movement were reviewed in detail with the patient. Please refer to After Visit Summary for other counseling recommendations.  Return in 2 weeks (on 02/24/2022) for [facility], ob visit, needs MD.   Venora Maples, MD

## 2022-02-10 NOTE — Patient Instructions (Signed)

## 2022-02-11 ENCOUNTER — Other Ambulatory Visit: Payer: Self-pay

## 2022-02-11 ENCOUNTER — Inpatient Hospital Stay (HOSPITAL_COMMUNITY)
Admission: AD | Admit: 2022-02-11 | Discharge: 2022-02-11 | Disposition: A | Discharge status: Home / Self Care | Payer: Medicaid Other | Attending: Obstetrics and Gynecology | Admitting: Obstetrics and Gynecology

## 2022-02-11 ENCOUNTER — Encounter (HOSPITAL_COMMUNITY): Payer: Self-pay | Admitting: Obstetrics and Gynecology

## 2022-02-11 ENCOUNTER — Encounter: Payer: Self-pay | Admitting: *Deleted

## 2022-02-11 DIAGNOSIS — O212 Late vomiting of pregnancy: Secondary | ICD-10-CM | POA: Insufficient documentation

## 2022-02-11 DIAGNOSIS — O23592 Infection of other part of genital tract in pregnancy, second trimester: Secondary | ICD-10-CM | POA: Diagnosis not present

## 2022-02-11 DIAGNOSIS — O26892 Other specified pregnancy related conditions, second trimester: Secondary | ICD-10-CM | POA: Insufficient documentation

## 2022-02-11 DIAGNOSIS — B9689 Other specified bacterial agents as the cause of diseases classified elsewhere: Secondary | ICD-10-CM

## 2022-02-11 DIAGNOSIS — O99322 Drug use complicating pregnancy, second trimester: Secondary | ICD-10-CM | POA: Diagnosis not present

## 2022-02-11 DIAGNOSIS — Z3A25 25 weeks gestation of pregnancy: Secondary | ICD-10-CM | POA: Insufficient documentation

## 2022-02-11 DIAGNOSIS — F1120 Opioid dependence, uncomplicated: Secondary | ICD-10-CM | POA: Insufficient documentation

## 2022-02-11 DIAGNOSIS — O219 Vomiting of pregnancy, unspecified: Secondary | ICD-10-CM

## 2022-02-11 DIAGNOSIS — N760 Acute vaginitis: Secondary | ICD-10-CM

## 2022-02-11 LAB — CERVICOVAGINAL ANCILLARY ONLY
Bacterial Vaginitis (gardnerella): POSITIVE — AB
Candida Glabrata: NEGATIVE
Candida Vaginitis: NEGATIVE
Chlamydia: NEGATIVE
Comment: NEGATIVE
Comment: NEGATIVE
Comment: NEGATIVE
Comment: NEGATIVE
Comment: NEGATIVE
Comment: NORMAL
Neisseria Gonorrhea: NEGATIVE
Trichomonas: NEGATIVE

## 2022-02-11 LAB — URINALYSIS, ROUTINE W REFLEX MICROSCOPIC
Bilirubin Urine: NEGATIVE
Glucose, UA: NEGATIVE mg/dL
Hgb urine dipstick: NEGATIVE
Ketones, ur: NEGATIVE mg/dL
Leukocytes,Ua: NEGATIVE
Nitrite: NEGATIVE
Protein, ur: NEGATIVE mg/dL
Specific Gravity, Urine: 1.018 (ref 1.005–1.030)
pH: 6 (ref 5.0–8.0)

## 2022-02-11 MED ORDER — ONDANSETRON 4 MG PO TBDP
8.0000 mg | ORAL_TABLET | Freq: Once | ORAL | Status: AC
Start: 2022-02-11 — End: 2022-02-11
  Administered 2022-02-11: 8 mg via ORAL
  Filled 2022-02-11: qty 2

## 2022-02-11 MED ORDER — BUPRENORPHINE HCL-NALOXONE HCL 8-2 MG SL SUBL
2.0000 | SUBLINGUAL_TABLET | Freq: Once | SUBLINGUAL | Status: AC
  Administered 2022-02-11: 2 via SUBLINGUAL
  Filled 2022-02-11: qty 2

## 2022-02-11 MED ORDER — METRONIDAZOLE 0.75 % VA GEL: 1.0000 | Freq: Every day | VAGINAL | 0 refills | Status: AC

## 2022-02-11 MED ORDER — SCOPOLAMINE 1 MG/3DAYS TD PT72
1.0000 | MEDICATED_PATCH | TRANSDERMAL | Status: DC
  Administered 2022-02-11: 1.5 mg via TRANSDERMAL
  Filled 2022-02-11: qty 1

## 2022-02-11 NOTE — Discharge Instructions (Addendum)
You were seen in the MAU for nausea and vomiting. This was likely caused by the flagyl pills I prescribed you yesterday. I have sent you a different form of that medication, a vaginal cream that will treat the bacterial vaginosis but shouldn't make you feel nauseous. I will see you back in clinic in two weeks.

## 2022-02-11 NOTE — MAU Note (Signed)
Chloe Rodgers is a 31 y.o. at [redacted]w[redacted]d here in MAU reporting: woke up this AM with lower abdominal pain and feeling sick. States she picked up her meds yesterday but hasnt been able to keep them down. No bleeding, no LOF. DFM today.  Onset of complaint: today  Pain score: 8/10  Vitals:   02/11/22 1219  BP: 119/73  Pulse: (!) 106  Resp: 20  Temp: 97.7 F (36.5 C)  SpO2: 100%     FHT: EFM applied in room  Lab orders placed from triage: UA
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
## 2022-02-15 ENCOUNTER — Telehealth: Payer: Self-pay | Admitting: Family Medicine

## 2022-02-15 LAB — TOXASSURE SELECT 13 (MW), URINE

## 2022-02-15 NOTE — Telephone Encounter (Signed)
Returned patients call. Discussed with patient it can take a few weeks to get the order, have it signed and back to the company to be able to get her breast pump. Patient was informed it is not in the office at this time, and will be taken care of once we receive it. Patient voiced understanding.

## 2022-02-15 NOTE — Telephone Encounter (Signed)
Patient called in wanting to know the status of her breast pump order that was sent in by her insurance as of last week.

## 2022-02-18 ENCOUNTER — Other Ambulatory Visit: Payer: Self-pay

## 2022-02-18 ENCOUNTER — Encounter: Payer: Medicaid Other | Admitting: Obstetrics and Gynecology

## 2022-02-18 ENCOUNTER — Other Ambulatory Visit: Payer: Medicaid Other

## 2022-02-18 DIAGNOSIS — O0990 Supervision of high risk pregnancy, unspecified, unspecified trimester: Secondary | ICD-10-CM

## 2022-02-19 ENCOUNTER — Other Ambulatory Visit: Payer: Medicaid Other

## 2022-02-19 ENCOUNTER — Other Ambulatory Visit: Payer: Self-pay

## 2022-02-19 DIAGNOSIS — O0990 Supervision of high risk pregnancy, unspecified, unspecified trimester: Secondary | ICD-10-CM

## 2022-02-20 LAB — CBC
Hematocrit: 31.9 % — ABNORMAL LOW (ref 34.0–46.6)
Hemoglobin: 10.3 g/dL — ABNORMAL LOW (ref 11.1–15.9)
MCH: 27 pg (ref 26.6–33.0)
MCHC: 32.3 g/dL (ref 31.5–35.7)
MCV: 84 fL (ref 79–97)
Platelets: 171 10*3/uL (ref 150–450)
RBC: 3.82 x10E6/uL (ref 3.77–5.28)
RDW: 13.5 % (ref 11.7–15.4)
WBC: 5.9 10*3/uL (ref 3.4–10.8)

## 2022-02-20 LAB — GLUCOSE TOLERANCE, 2 HOURS W/ 1HR
Glucose, 1 hour: 168 mg/dL (ref 70–179)
Glucose, 2 hour: 137 mg/dL (ref 70–152)
Glucose, Fasting: 100 mg/dL — ABNORMAL HIGH (ref 70–91)

## 2022-02-20 LAB — RPR: RPR Ser Ql: NONREACTIVE

## 2022-02-20 LAB — HIV ANTIBODY (ROUTINE TESTING W REFLEX): HIV Screen 4th Generation wRfx: NONREACTIVE

## 2022-02-22 ENCOUNTER — Encounter: Payer: Self-pay | Admitting: Family Medicine

## 2022-02-22 DIAGNOSIS — Z8632 Personal history of gestational diabetes: Secondary | ICD-10-CM | POA: Insufficient documentation

## 2022-02-22 DIAGNOSIS — O24419 Gestational diabetes mellitus in pregnancy, unspecified control: Secondary | ICD-10-CM | POA: Insufficient documentation

## 2022-02-23 ENCOUNTER — Encounter: Payer: Medicaid Other | Admitting: Family Medicine

## 2022-02-24 ENCOUNTER — Encounter: Payer: Medicaid Other | Admitting: Family Medicine

## 2022-03-01 IMAGING — US US PELVIS COMPLETE TRANSABD/TRANSVAG W DUPLEX
1 series · 13 of 25 positions shown · non-contrast
Comparison: 04/21/2018

CLINICAL DATA: Bilateral pelvic pain for 3 months

EXAM:
TRANSABDOMINAL AND TRANSVAGINAL ULTRASOUND OF PELVIS
DOPPLER ULTRASOUND OF OVARIES
TECHNIQUE: Both transabdominal and transvaginal ultrasound examinations of the
pelvis were performed. Transabdominal technique was performed for
global imaging of the pelvis including uterus, ovaries, adnexal
regions, and pelvic cul-de-sac.
It was necessary to proceed with endovaginal exam following the
transabdominal exam to visualize the ovaries. Color and duplex
Doppler ultrasound was utilized to evaluate blood flow to the
ovaries.

[Series 1: us pelvic complete w transvaginal and torsion righ · 13 of 88 slices shown]
[im 1/88]
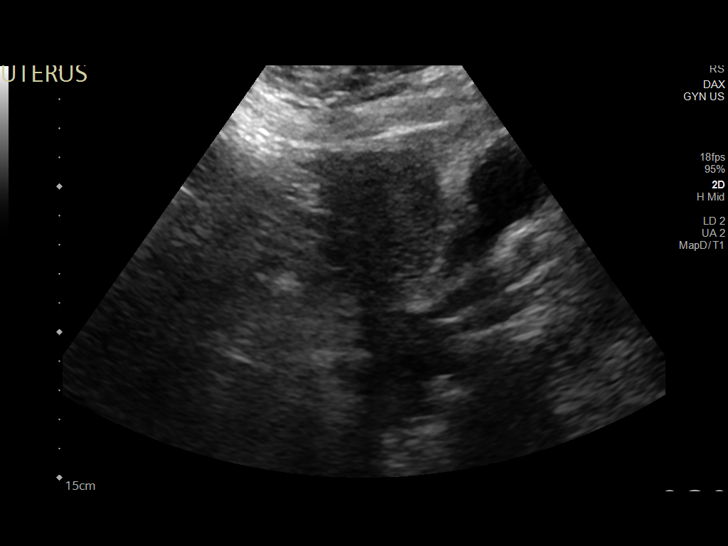
[im 8/88]
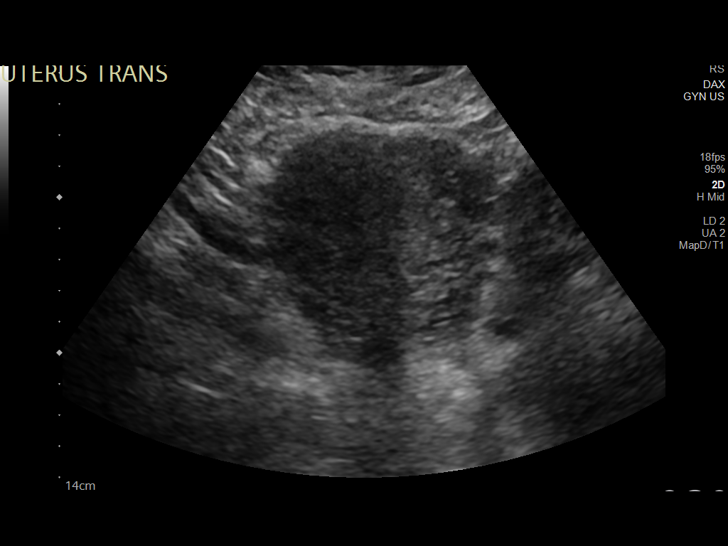
[im 15/88]
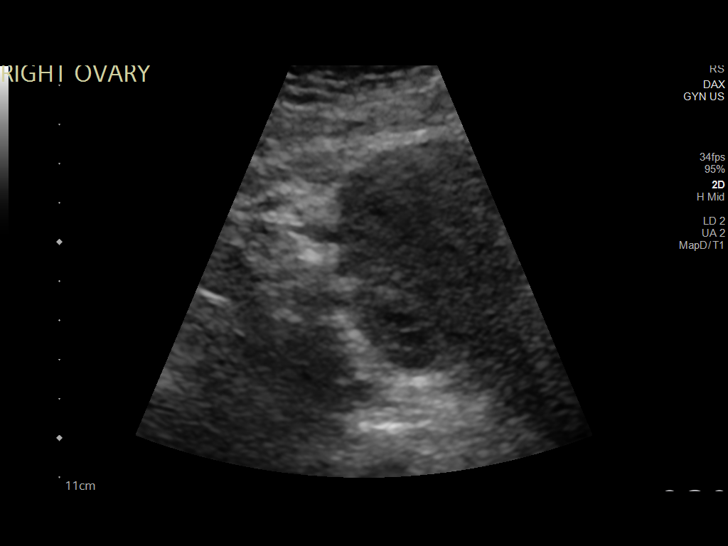
[im 22/88]
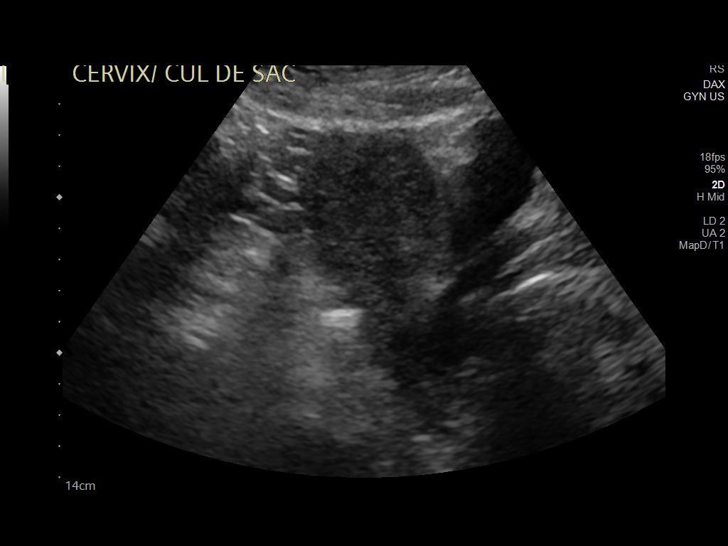
[im 30/88]
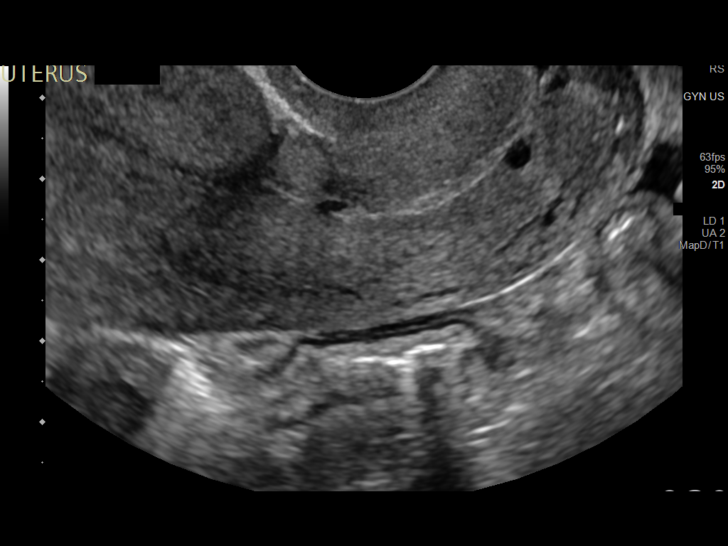
[im 37/88]
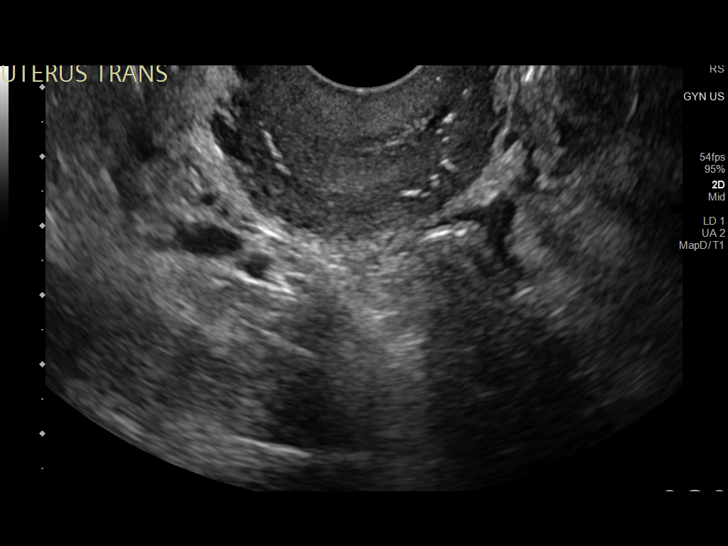
[im 44/88]
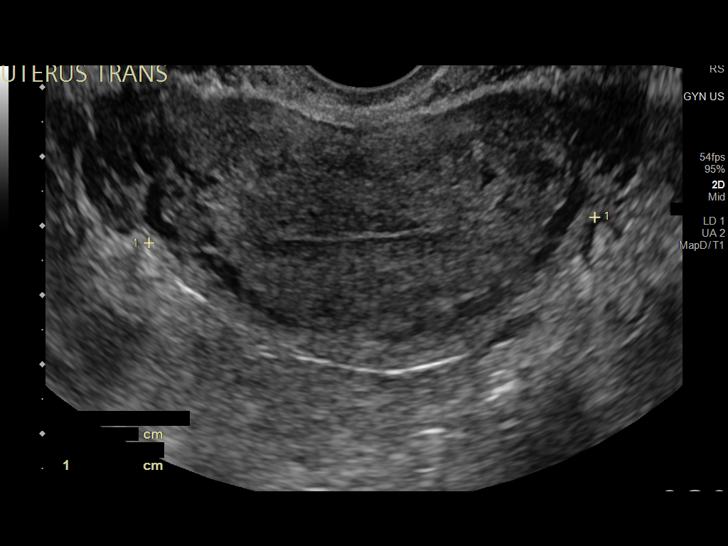
[im 51/88]
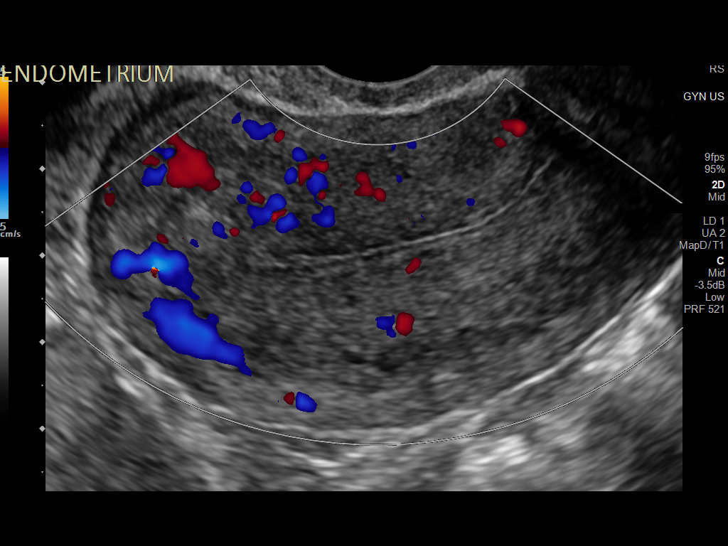
[im 59/88]
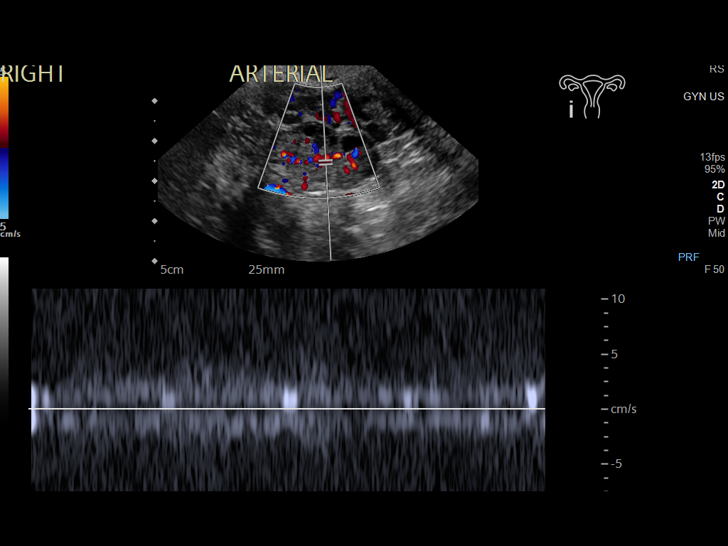
[im 66/88]
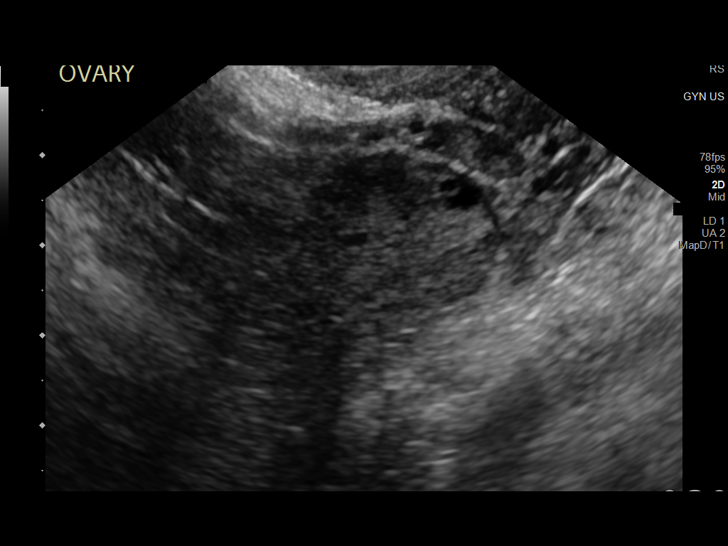
[im 73/88]
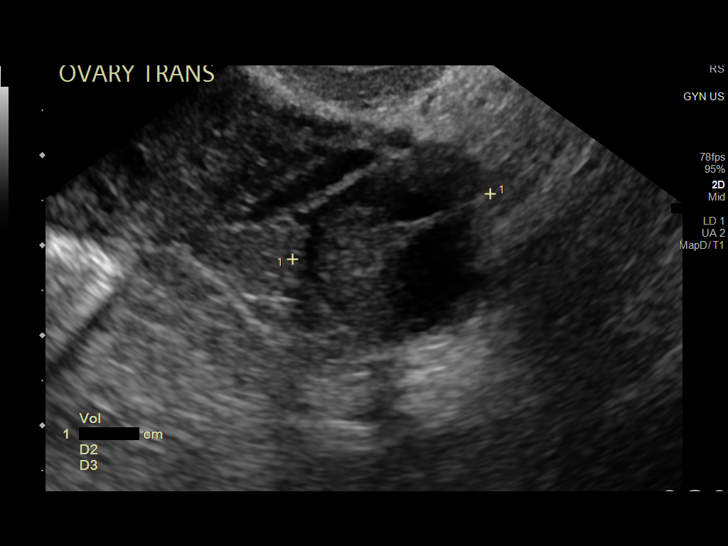
[im 80/88]
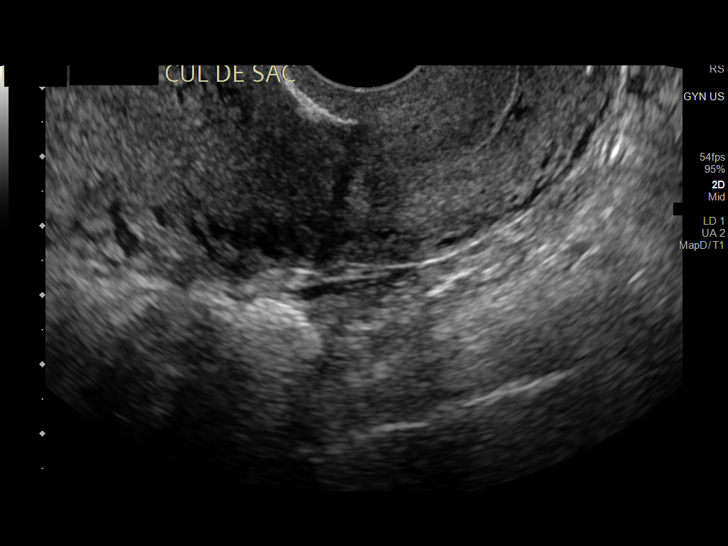
[im 88/88]
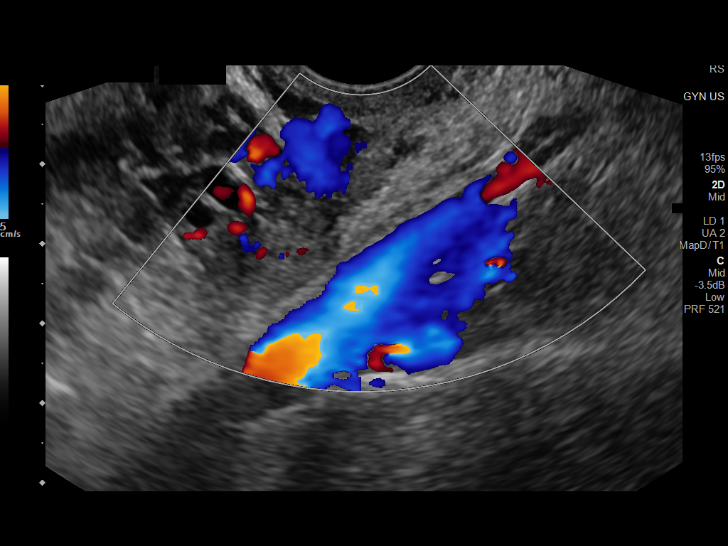

[13 of 25 positions shown; findings below may reference images not displayed]

FINDINGS: Uterus

Measurements: 9.4 x 5.1 x 7.3 cm = volume: 183 mL. No fibroids or
other mass visualized.

Endometrium

Thickness: 5 mm.  No focal abnormality visualized.

Right ovary

Measurements: 2.8 x 1.6 x 1.9 cm = volume: 5 mL. Normal
appearance/no adnexal mass.

Left ovary

Measurements: 2.7 x 2.3 x 2.3 cm = volume: 8 mL. Normal
appearance/no adnexal mass.

Pulsed Doppler evaluation of both ovaries demonstrates normal
low-resistance arterial and venous waveforms.

Other findings

No abnormal free fluid.
IMPRESSION: Negative pelvic ultrasound.  No evidence of adnexal torsion.

## 2022-03-02 ENCOUNTER — Ambulatory Visit: Payer: Medicaid Other | Admitting: *Deleted

## 2022-03-02 ENCOUNTER — Ambulatory Visit: Payer: Medicaid Other | Attending: Maternal & Fetal Medicine

## 2022-03-02 ENCOUNTER — Other Ambulatory Visit: Payer: Self-pay | Admitting: *Deleted

## 2022-03-02 ENCOUNTER — Encounter: Payer: Self-pay | Admitting: *Deleted

## 2022-03-02 VITALS — BP 118/68 | HR 103

## 2022-03-02 DIAGNOSIS — F1920 Other psychoactive substance dependence, uncomplicated: Secondary | ICD-10-CM | POA: Diagnosis not present

## 2022-03-02 DIAGNOSIS — O99353 Diseases of the nervous system complicating pregnancy, third trimester: Secondary | ICD-10-CM

## 2022-03-02 DIAGNOSIS — O99352 Diseases of the nervous system complicating pregnancy, second trimester: Secondary | ICD-10-CM | POA: Diagnosis present

## 2022-03-02 DIAGNOSIS — G40909 Epilepsy, unspecified, not intractable, without status epilepticus: Secondary | ICD-10-CM | POA: Insufficient documentation

## 2022-03-02 DIAGNOSIS — Z87898 Personal history of other specified conditions: Secondary | ICD-10-CM

## 2022-03-02 DIAGNOSIS — Z3A28 28 weeks gestation of pregnancy: Secondary | ICD-10-CM

## 2022-03-02 DIAGNOSIS — O99323 Drug use complicating pregnancy, third trimester: Secondary | ICD-10-CM | POA: Diagnosis not present

## 2022-03-02 DIAGNOSIS — O0990 Supervision of high risk pregnancy, unspecified, unspecified trimester: Secondary | ICD-10-CM

## 2022-03-02 DIAGNOSIS — O24419 Gestational diabetes mellitus in pregnancy, unspecified control: Secondary | ICD-10-CM | POA: Diagnosis not present

## 2022-03-02 DIAGNOSIS — O99212 Obesity complicating pregnancy, second trimester: Secondary | ICD-10-CM | POA: Insufficient documentation

## 2022-03-02 DIAGNOSIS — O99320 Drug use complicating pregnancy, unspecified trimester: Secondary | ICD-10-CM | POA: Insufficient documentation

## 2022-03-02 DIAGNOSIS — Z98891 History of uterine scar from previous surgery: Secondary | ICD-10-CM

## 2022-03-02 DIAGNOSIS — O34219 Maternal care for unspecified type scar from previous cesarean delivery: Secondary | ICD-10-CM | POA: Diagnosis present

## 2022-03-05 ENCOUNTER — Telehealth: Payer: Self-pay | Admitting: *Deleted

## 2022-03-05 DIAGNOSIS — O24419 Gestational diabetes mellitus in pregnancy, unspecified control: Secondary | ICD-10-CM

## 2022-03-05 MED ORDER — ACCU-CHEK GUIDE VI STRP
ORAL_STRIP | 12 refills | Status: DC
Start: 2022-03-05 — End: 2022-05-06

## 2022-03-05 MED ORDER — ACCU-CHEK GUIDE W/DEVICE KIT: 1.0000 | PACK | Freq: Four times a day (QID) | 0 refills | Status: DC

## 2022-03-05 MED ORDER — ACCU-CHEK SOFTCLIX LANCETS MISC
12 refills | Status: DC
Start: 2022-03-05 — End: 2022-05-06

## 2022-03-05 NOTE — Telephone Encounter (Addendum)
-----   Message from Kathee Delton, RN sent at 03/04/2022  4:51 PM EDT ----- Pt has GDM- needs to be informed, education appt made, and supplies sent to pharmacy  8/25  1220  Called pt and informed her of 2hr GTT results and need for diabetes education appointment on 8/31 @ 9:15 am. She will need to obtain her testing supplies and bring to the appt.  She voiced understanding and agreed.

## 2022-03-08 ENCOUNTER — Other Ambulatory Visit: Payer: Self-pay | Admitting: *Deleted

## 2022-03-08 DIAGNOSIS — O24419 Gestational diabetes mellitus in pregnancy, unspecified control: Secondary | ICD-10-CM

## 2022-03-11 ENCOUNTER — Other Ambulatory Visit: Payer: Self-pay

## 2022-03-11 ENCOUNTER — Encounter: Payer: Medicaid Other | Attending: Nurse Practitioner | Admitting: Registered"

## 2022-03-11 ENCOUNTER — Ambulatory Visit (INDEPENDENT_AMBULATORY_CARE_PROVIDER_SITE_OTHER): Payer: Medicaid Other | Admitting: Registered"

## 2022-03-11 DIAGNOSIS — O24419 Gestational diabetes mellitus in pregnancy, unspecified control: Secondary | ICD-10-CM

## 2022-03-11 DIAGNOSIS — Z3A29 29 weeks gestation of pregnancy: Secondary | ICD-10-CM | POA: Diagnosis not present

## 2022-03-11 NOTE — Progress Notes (Signed)
Anemic since childhood Patient was seen for Gestational Diabetes self-management on 03/11/22  Start time 4010 and End time 1011   Estimated due date: 05/23/22; [redacted]w[redacted]d  This patient is accompanied in the office by her  partner .  Clinical: Medications: reviewed Medical History: family history of T2D, kidney stones, migraines Labs: OGTT FBS 100, A1c 5.0%   Dietary and Lifestyle History: Pt states she wants to understand what gestational diabetes is. Pt reports she sometimes skips breakfast due to nausea from pregnancy but also from her pain medication.  Pt states she has MD visit tomorrow. RD advised patient to discuss fatigue, history of anemia and eating 4-6 cups of ice daily. Pt may be drinking excessive sweet tea due to fatigue which may be due to iron deficiency.  Physical Activity: ADL Stress: not assessed Sleep: 3-4 hours + naps during the day (pain medication makes sleepy, was taking oxycode)  24 hr Recall:  First Meal: bacon egg cheese croissant, orange juice Snack: chips, candy Second meal: sandwich, chicken wings Snack:granola bar Third meal: pizza, breadsticks, wings, sweet tea Snack: granola bars Beverages: water, sweet tea, OJ, eating ice  NUTRITION INTERVENTION  Nutrition education (E-1) on the following topics:   Initial Follow-up  [x]  []  Definition of Gestational Diabetes [x]  []  Why dietary management is important in controlling blood glucose [x]  []  Effects each nutrient has on blood glucose levels [x]  []  Simple carbohydrates vs complex carbohydrates [x]  []  Fluid intake [x]  []  Creating a balanced meal plan [x]  []  Carbohydrate counting  [x]  []  When to check blood glucose levels [x]  []  Proper blood glucose monitoring techniques [x]  []  Effect of stress and stress reduction techniques  [x]  []  Exercise effect on blood glucose levels, appropriate exercise during pregnancy [x]  []  Importance of limiting caffeine and abstaining from alcohol and  smoking [x]  []  Medications used for blood sugar control during pregnancy [x]  []  Hypoglycemia and rule of 15 [x]  []  Postpartum self care  Patient has a meter prior to visit. Patient received instruction during visit. CBG: 182 mg/dL  Patient instructed to monitor glucose levels: FBS: 60 - ? 95 mg/dL (some clinics use 90 for cutoff) 1 hour: ? 140 mg/dL 2 hour: ? mg/dL  Patient received handouts: Nutrition Diabetes and Pregnancy Carbohydrate Counting List  Patient will be seen for follow-up as needed.

## 2022-03-12 ENCOUNTER — Encounter: Payer: Self-pay | Admitting: Family Medicine

## 2022-03-12 ENCOUNTER — Telehealth (INDEPENDENT_AMBULATORY_CARE_PROVIDER_SITE_OTHER): Payer: Medicaid Other | Admitting: Family Medicine

## 2022-03-12 DIAGNOSIS — O99323 Drug use complicating pregnancy, third trimester: Secondary | ICD-10-CM

## 2022-03-12 DIAGNOSIS — Z98891 History of uterine scar from previous surgery: Secondary | ICD-10-CM

## 2022-03-12 DIAGNOSIS — O24410 Gestational diabetes mellitus in pregnancy, diet controlled: Secondary | ICD-10-CM

## 2022-03-12 DIAGNOSIS — F1120 Opioid dependence, uncomplicated: Secondary | ICD-10-CM

## 2022-03-12 DIAGNOSIS — O99891 Other specified diseases and conditions complicating pregnancy: Secondary | ICD-10-CM

## 2022-03-12 DIAGNOSIS — R8271 Bacteriuria: Secondary | ICD-10-CM

## 2022-03-12 DIAGNOSIS — O0990 Supervision of high risk pregnancy, unspecified, unspecified trimester: Secondary | ICD-10-CM

## 2022-03-12 DIAGNOSIS — Z3A29 29 weeks gestation of pregnancy: Secondary | ICD-10-CM

## 2022-03-12 DIAGNOSIS — O34219 Maternal care for unspecified type scar from previous cesarean delivery: Secondary | ICD-10-CM

## 2022-03-12 DIAGNOSIS — G40909 Epilepsy, unspecified, not intractable, without status epilepticus: Secondary | ICD-10-CM

## 2022-03-12 DIAGNOSIS — O99353 Diseases of the nervous system complicating pregnancy, third trimester: Secondary | ICD-10-CM

## 2022-03-12 MED ORDER — ONDANSETRON 4 MG PO TBDP: 4.0000 mg | ORAL_TABLET | Freq: Four times a day (QID) | ORAL | 0 refills | Status: DC | PRN

## 2022-03-12 NOTE — Progress Notes (Signed)
I connected with Chloe Rodgers 03/12/22 at  9:55 AM EDT by: MyChart video and verified that I am speaking with the correct person using two identifiers.  Patient is located at home and provider is located at [facility].     I discussed the limitations, risks, security and privacy concerns of performing an evaluation and management service by MyChart video and the availability of in person appointments. I also discussed with the patient that there may be a patient responsible charge related to this service. By engaging in this virtual visit, you consent to the provision of healthcare.  Additionally, you authorize for your insurance to be billed for the services provided during this visit.  The patient expressed understanding and agreed to proceed.  The following staff members participated in the virtual visit:  Venora Maples, MD/MPH Attending Family Medicine Physician, Faculty [facility], [facility]     PRENATAL VISIT NOTE  Subjective:  Chloe Rodgers is a 31 y.o. [id number] at [redacted]w[redacted]d  for virtual visit for ongoing prenatal care.  She is currently monitored for the following issues for this high-risk pregnancy and has Seizure disorder (HCC); Family history of breast cancer in mother; Obese; Migraines; Nephrolithiasis; Chronic PID (chronic pelvic inflammatory disease); Family history of breast cancer; Family history of colon cancer; Genetic testing; Supervision of high risk pregnancy, antepartum; Uncomplicated opioid dependence (HCC); Obesity in pregnancy, antepartum; History of cesarean section; GBS bacteriuria; and GDM (gestational diabetes mellitus) on their problem list.  Patient reports  lots of pain and discomfort .  Contractions: Irritability. Vag. Bleeding: None.  Movement: Present. Denies leaking of fluid.   The following portions of the patient's history were reviewed and updated as appropriate: allergies, current medications, past family  history, past medical history, past social history, past surgical history and problem list.   Objective:  There were no vitals filed for this visit. Self-Obtained  Fetal Status:     Movement: Present     Assessment and Plan:  Pregnancy: [id number] at [redacted]w[redacted]d 1. Supervision of high risk pregnancy, antepartum Vigorous fetal movement Lots of aches and pains with advancing gestational age, discussed this is likely the source in addition to chronic pain - ondansetron (ZOFRAN-ODT) 4 MG disintegrating tablet; Take 1 tablet (4 mg total) by mouth every 6 (six) hours as needed for nausea.  Dispense: 20 tablet; Refill: 0  2. History of cesarean section X3, plan for RCS+BTL Sign tubal papers at next visit Message sent to schedule, discussed 39 wk delivery will be dependent on good glucose control  3. Uncomplicated opioid dependence (HCC) Increased to 8 mg TID Goes to outside provider, PDMP reviewed and prescriptions are filled regularly UDS at next in person visit NAS consult has been sent previously  4. GBS bacteriuria Ppx in labor  5. Seizure disorder [facility]) Still needs to reschedule with Neuro, encouraged to do so  6. Diet controlled gestational diabetes mellitus (GDM) in third trimester Just saw Angela yesterday, will review log at next visit Following w MFM for growth scans, LGA with EFW 94% on last growth [place]  Preterm labor symptoms and general obstetric precautions including but not limited to vaginal bleeding, contractions, leaking of fluid and fetal movement were reviewed in detail with the patient.  Return in 2 weeks (on 03/26/2022) for [facility], ob visit.  Future Appointments  Date Time Provider Department Center  03/30/2022  9:30 AM [facility] NURSE [facility]  03/30/2022  9:45 AM [facility] US6 [facility]  Time spent on virtual visit: 15 minutes  Venora Maples, MD

## 2022-03-12 NOTE — Patient Instructions (Signed)

## 2022-03-15 ENCOUNTER — Encounter: Payer: Self-pay | Admitting: Family Medicine

## 2022-03-16 ENCOUNTER — Encounter: Payer: Self-pay | Admitting: Family Medicine

## 2022-03-17 ENCOUNTER — Telehealth: Payer: Self-pay | Admitting: Lactation Services

## 2022-03-17 NOTE — Telephone Encounter (Signed)
Called patient to discuss her contraction and pain that she reported via My Chart message. Call went straight to voicemail. Left voicemail that she needs to go to MAU for evaluation.

## 2022-03-19 ENCOUNTER — Encounter: Payer: Self-pay | Admitting: Family Medicine

## 2022-03-22 ENCOUNTER — Other Ambulatory Visit: Payer: Self-pay

## 2022-03-22 ENCOUNTER — Encounter: Payer: Self-pay | Admitting: Family Medicine

## 2022-03-22 DIAGNOSIS — O24419 Gestational diabetes mellitus in pregnancy, unspecified control: Secondary | ICD-10-CM

## 2022-03-22 MED ORDER — METFORMIN HCL 500 MG PO TABS: 500.0000 mg | ORAL_TABLET | Freq: Two times a day (BID) | ORAL | 2 refills | Status: DC

## 2022-03-24 ENCOUNTER — Other Ambulatory Visit: Payer: Self-pay

## 2022-03-24 ENCOUNTER — Other Ambulatory Visit (HOSPITAL_COMMUNITY)
Admission: RE | Admit: 2022-03-24 | Discharge: 2022-03-24 | Disposition: A | Discharge status: Home / Self Care | Payer: Medicaid Other | Source: Ambulatory Visit | Attending: Family Medicine | Admitting: Family Medicine

## 2022-03-24 ENCOUNTER — Ambulatory Visit (INDEPENDENT_AMBULATORY_CARE_PROVIDER_SITE_OTHER): Payer: Medicaid Other | Admitting: Family Medicine

## 2022-03-24 VITALS — BP 127/85 | HR 92 | Wt 257.3 lb

## 2022-03-24 DIAGNOSIS — O24410 Gestational diabetes mellitus in pregnancy, diet controlled: Secondary | ICD-10-CM

## 2022-03-24 DIAGNOSIS — G40909 Epilepsy, unspecified, not intractable, without status epilepticus: Secondary | ICD-10-CM

## 2022-03-24 DIAGNOSIS — N760 Acute vaginitis: Secondary | ICD-10-CM | POA: Diagnosis present

## 2022-03-24 DIAGNOSIS — Z98891 History of uterine scar from previous surgery: Secondary | ICD-10-CM

## 2022-03-24 DIAGNOSIS — O99210 Obesity complicating pregnancy, unspecified trimester: Secondary | ICD-10-CM

## 2022-03-24 DIAGNOSIS — O24419 Gestational diabetes mellitus in pregnancy, unspecified control: Secondary | ICD-10-CM

## 2022-03-24 DIAGNOSIS — F1120 Opioid dependence, uncomplicated: Secondary | ICD-10-CM

## 2022-03-24 DIAGNOSIS — R8271 Bacteriuria: Secondary | ICD-10-CM

## 2022-03-24 DIAGNOSIS — O0990 Supervision of high risk pregnancy, unspecified, unspecified trimester: Secondary | ICD-10-CM

## 2022-03-24 DIAGNOSIS — N200 Calculus of kidney: Secondary | ICD-10-CM

## 2022-03-24 MED ORDER — METFORMIN HCL 1000 MG PO TABS: 1000.0000 mg | ORAL_TABLET | Freq: Two times a day (BID) | ORAL | 5 refills | Status: DC

## 2022-03-24 MED ORDER — ONDANSETRON 4 MG PO TBDP: 4.0000 mg | ORAL_TABLET | Freq: Four times a day (QID) | ORAL | 5 refills | Status: DC | PRN

## 2022-03-24 NOTE — Patient Instructions (Signed)

## 2022-03-24 NOTE — Progress Notes (Signed)
   Subjective:  Chloe Rodgers is a 31 y.o. [phone] at [redacted]w[redacted]d being seen today for ongoing prenatal care.  She is currently monitored for the following issues for this high-risk pregnancy and has Seizure disorder (HCC); Family history of breast cancer in mother; Obese; Migraines; Nephrolithiasis; Chronic PID (chronic pelvic inflammatory disease); Family history of breast cancer; Family history of colon cancer; Genetic testing; Supervision of high risk pregnancy, antepartum; Uncomplicated opioid dependence (HCC); Obesity in pregnancy, antepartum; History of cesarean section; GBS bacteriuria; and GDM (gestational diabetes mellitus) on their problem list.  Patient reports no complaints.  Contractions: Irritability. Vag. Bleeding: None.  Movement: Present. Denies leaking of fluid.   The following portions of the patient's history were reviewed and updated as appropriate: allergies, current medications, past family history, past medical history, past social history, past surgical history and problem list. Problem list updated.  Objective:   Vitals:   03/24/22 1622  BP: 127/85  Pulse: 92  Weight: 257 lb 4.8 oz (116.7 kg)    Fetal Status: Fetal Heart Rate (bpm): 145   Movement: Present     General:  Alert, oriented and cooperative. Patient is in no acute distress.  Skin: Skin is warm and dry. No rash noted.   Cardiovascular: Normal heart rate noted  Respiratory: Normal respiratory effort, no problems with respiration noted  Abdomen: Soft, gravid, appropriate for gestational age. Pain/Pressure: Present     Pelvic: Vag. Bleeding: None Vag D/C Character: Yellow   Cervical exam deferred        Extremities: Normal range of motion.     Mental Status: Normal mood and affect. Normal behavior. Normal judgment and thought content.   Urinalysis:        Assessment and Plan:  Pregnancy: [id number] at [redacted]w[redacted]d  1. Supervision of high risk pregnancy, antepartum BP and FHR normal  2. History of cesarean  section X3, for RCS+BTL at 39 weeks  Signed tubal papers today  3. Uncomplicated opioid dependence (HCC) Feels like her current dose of 8 mg TID is not sufficient to control pain or withdrawal symptoms We discussed that dose increases are common as patient's get farther along in pregnancy No objection from my standpoint to increasing up to 8mg  QID She will discuss with her suboxone provider UDS today - ToxASSURE Select 13 (MW), Urine  4. Acute vaginitis Ongoing issue Self swab obtained - Cervicovaginal ancillary only( [place])  5. Diet controlled gestational diabetes mellitus (GDM) in third trimester See photo above, patient started metformin 500 mg BID yesterday She is borderline for insulin but I'd like to see how she does with increased dose of metformin first She was instructed to increase her dose to 1g BID in two days, hopefully this will avoid rapid onset of GI side effects from metformin Will check in with her virtually next week, if remains uncontrolled will start BID NPH Most recent growth showed LGA infant, EFW 94%, has follow up US next week  6. Seizure disorder (HCC) Has not seen Neuro this pregnancy, have encouraged her to do so  7. GBS bacteriuria   Preterm labor symptoms and general obstetric precautions including but not limited to vaginal bleeding, contractions, leaking of fluid and fetal movement were reviewed in detail with the patient. Please refer to After Visit Summary for other counseling recommendations.  Return in 2 weeks (on 04/07/2022) for [facility], ob visit.   [facility], MD

## 2022-03-25 LAB — CERVICOVAGINAL ANCILLARY ONLY
Bacterial Vaginitis (gardnerella): POSITIVE — AB
Candida Glabrata: NEGATIVE
Candida Vaginitis: POSITIVE — AB
Chlamydia: NEGATIVE
Comment: NEGATIVE
Comment: NEGATIVE
Comment: NEGATIVE
Comment: NEGATIVE
Comment: NEGATIVE
Comment: NORMAL
Neisseria Gonorrhea: NEGATIVE
Trichomonas: NEGATIVE

## 2022-03-25 MED ORDER — FLUCONAZOLE 150 MG PO TABS: 150.0000 mg | ORAL_TABLET | Freq: Once | ORAL | 0 refills | Status: AC

## 2022-03-25 MED ORDER — METRONIDAZOLE 500 MG PO TABS: 500.0000 mg | ORAL_TABLET | Freq: Two times a day (BID) | ORAL | 0 refills | Status: AC

## 2022-03-25 NOTE — Addendum Note (Signed)
Addended by: Merian Capron on: 03/25/2022 01:25 PM   Modules accepted: Orders

## 2022-03-27 LAB — TOXASSURE SELECT 13 (MW), URINE

## 2022-03-29 ENCOUNTER — Encounter (HOSPITAL_COMMUNITY): Payer: Self-pay | Admitting: Obstetrics & Gynecology

## 2022-03-29 ENCOUNTER — Inpatient Hospital Stay (HOSPITAL_BASED_OUTPATIENT_CLINIC_OR_DEPARTMENT_OTHER): Payer: Medicaid Other

## 2022-03-29 ENCOUNTER — Other Ambulatory Visit: Payer: Self-pay

## 2022-03-29 ENCOUNTER — Observation Stay (HOSPITAL_COMMUNITY)
Admission: AD | Admit: 2022-03-29 | Discharge: 2022-03-30 | Disposition: A | Discharge status: Home / Self Care | Payer: Medicaid Other | Attending: Obstetrics and Gynecology | Admitting: Obstetrics and Gynecology

## 2022-03-29 ENCOUNTER — Inpatient Hospital Stay (EMERGENCY_DEPARTMENT_HOSPITAL)
Admission: AD | Admit: 2022-03-29 | Discharge: 2022-03-29 | Discharge status: Against Medical Advice | Payer: Medicaid Other | Source: Home / Self Care | Attending: Obstetrics & Gynecology | Admitting: Obstetrics & Gynecology

## 2022-03-29 DIAGNOSIS — O24419 Gestational diabetes mellitus in pregnancy, unspecified control: Secondary | ICD-10-CM | POA: Insufficient documentation

## 2022-03-29 DIAGNOSIS — O4703 False labor before 37 completed weeks of gestation, third trimester: Secondary | ICD-10-CM | POA: Diagnosis not present

## 2022-03-29 DIAGNOSIS — O99323 Drug use complicating pregnancy, third trimester: Secondary | ICD-10-CM | POA: Insufficient documentation

## 2022-03-29 DIAGNOSIS — O368330 Maternal care for abnormalities of the fetal heart rate or rhythm, third trimester, not applicable or unspecified: Secondary | ICD-10-CM | POA: Insufficient documentation

## 2022-03-29 DIAGNOSIS — N760 Acute vaginitis: Secondary | ICD-10-CM

## 2022-03-29 DIAGNOSIS — G894 Chronic pain syndrome: Secondary | ICD-10-CM

## 2022-03-29 DIAGNOSIS — Z7984 Long term (current) use of oral hypoglycemic drugs: Secondary | ICD-10-CM | POA: Insufficient documentation

## 2022-03-29 DIAGNOSIS — O26893 Other specified pregnancy related conditions, third trimester: Secondary | ICD-10-CM

## 2022-03-29 DIAGNOSIS — O368130 Decreased fetal movements, third trimester, not applicable or unspecified: Secondary | ICD-10-CM

## 2022-03-29 DIAGNOSIS — O99350 Diseases of the nervous system complicating pregnancy, unspecified trimester: Secondary | ICD-10-CM

## 2022-03-29 DIAGNOSIS — O368390 Maternal care for abnormalities of the fetal heart rate or rhythm, unspecified trimester, not applicable or unspecified: Secondary | ICD-10-CM

## 2022-03-29 DIAGNOSIS — O0990 Supervision of high risk pregnancy, unspecified, unspecified trimester: Secondary | ICD-10-CM

## 2022-03-29 DIAGNOSIS — O99320 Drug use complicating pregnancy, unspecified trimester: Secondary | ICD-10-CM | POA: Diagnosis not present

## 2022-03-29 DIAGNOSIS — R109 Unspecified abdominal pain: Secondary | ICD-10-CM | POA: Insufficient documentation

## 2022-03-29 DIAGNOSIS — O26899 Other specified pregnancy related conditions, unspecified trimester: Secondary | ICD-10-CM

## 2022-03-29 DIAGNOSIS — G40909 Epilepsy, unspecified, not intractable, without status epilepticus: Secondary | ICD-10-CM | POA: Insufficient documentation

## 2022-03-29 DIAGNOSIS — Z98891 History of uterine scar from previous surgery: Secondary | ICD-10-CM | POA: Insufficient documentation

## 2022-03-29 DIAGNOSIS — O99353 Diseases of the nervous system complicating pregnancy, third trimester: Secondary | ICD-10-CM | POA: Insufficient documentation

## 2022-03-29 DIAGNOSIS — Z0371 Encounter for suspected problem with amniotic cavity and membrane ruled out: Secondary | ICD-10-CM

## 2022-03-29 DIAGNOSIS — Z3A32 32 weeks gestation of pregnancy: Secondary | ICD-10-CM

## 2022-03-29 DIAGNOSIS — O24415 Gestational diabetes mellitus in pregnancy, controlled by oral hypoglycemic drugs: Secondary | ICD-10-CM | POA: Diagnosis not present

## 2022-03-29 DIAGNOSIS — O368190 Decreased fetal movements, unspecified trimester, not applicable or unspecified: Secondary | ICD-10-CM | POA: Diagnosis present

## 2022-03-29 DIAGNOSIS — O34219 Maternal care for unspecified type scar from previous cesarean delivery: Secondary | ICD-10-CM

## 2022-03-29 DIAGNOSIS — O288 Other abnormal findings on antenatal screening of mother: Secondary | ICD-10-CM

## 2022-03-29 DIAGNOSIS — R102 Pelvic and perineal pain: Secondary | ICD-10-CM

## 2022-03-29 DIAGNOSIS — Z7982 Long term (current) use of aspirin: Secondary | ICD-10-CM | POA: Insufficient documentation

## 2022-03-29 DIAGNOSIS — F1920 Other psychoactive substance dependence, uncomplicated: Secondary | ICD-10-CM

## 2022-03-29 DIAGNOSIS — F1190 Opioid use, unspecified, uncomplicated: Secondary | ICD-10-CM

## 2022-03-29 LAB — URINALYSIS, ROUTINE W REFLEX MICROSCOPIC
Glucose, UA: NEGATIVE mg/dL
Hgb urine dipstick: NEGATIVE
Ketones, ur: NEGATIVE mg/dL
Nitrite: NEGATIVE
Protein, ur: 30 mg/dL — AB
Specific Gravity, Urine: 1.02 (ref 1.005–1.030)
pH: 6.5 (ref 5.0–8.0)

## 2022-03-29 LAB — WET PREP, GENITAL
Clue Cells Wet Prep HPF POC: NONE SEEN
Sperm: NONE SEEN
Trich, Wet Prep: NONE SEEN
WBC, Wet Prep HPF POC: <10 (ref <10)
Yeast Wet Prep HPF POC: NONE SEEN

## 2022-03-29 LAB — GLUCOSE, CAPILLARY
Glucose-Capillary: 91 mg/dL (ref 70–99)
Glucose-Capillary: 77 mg/dL (ref 70–99)
Glucose-Capillary: 111 mg/dL — ABNORMAL HIGH (ref 70–99)

## 2022-03-29 LAB — URINALYSIS, MICROSCOPIC (REFLEX): RBC / HPF: NONE SEEN RBC/hpf (ref 0–5)

## 2022-03-29 MED ORDER — SODIUM CHLORIDE 0.9% FLUSH: 3.0000 mL | Freq: Two times a day (BID) | INTRAVENOUS | Status: DC

## 2022-03-29 MED ORDER — ONDANSETRON 4 MG PO TBDP
4.0000 mg | ORAL_TABLET | Freq: Four times a day (QID) | ORAL | Status: DC | PRN
  Administered 2022-03-29 – 2022-03-30 (×2): 4 mg via ORAL
  Filled 2022-03-29 (×2): qty 1

## 2022-03-29 MED ORDER — BUPRENORPHINE HCL-NALOXONE HCL 8-2 MG SL SUBL
1.0000 | SUBLINGUAL_TABLET | Freq: Once | SUBLINGUAL | Status: AC
  Administered 2022-03-29: 1 via SUBLINGUAL
  Filled 2022-03-29: qty 1

## 2022-03-29 MED ORDER — PRENATAL MULTIVITAMIN CH: 1.0000 | ORAL_TABLET | Freq: Every day | ORAL | Status: DC

## 2022-03-29 MED ORDER — METFORMIN HCL 500 MG PO TABS
1000.0000 mg | ORAL_TABLET | Freq: Two times a day (BID) | ORAL | Status: DC
  Administered 2022-03-29 – 2022-03-30 (×2): 1000 mg via ORAL
  Filled 2022-03-29 (×2): qty 2

## 2022-03-29 MED ORDER — METRONIDAZOLE 500 MG PO TABS: 500.0000 mg | ORAL_TABLET | Freq: Two times a day (BID) | ORAL | Status: DC

## 2022-03-29 MED ORDER — SODIUM CHLORIDE 0.9 % IV SOLN: 250.0000 mL | INTRAVENOUS | Status: DC | PRN

## 2022-03-29 MED ORDER — FLUCONAZOLE 150 MG PO TABS: 150.0000 mg | ORAL_TABLET | Freq: Every day | ORAL | Status: DC

## 2022-03-29 MED ORDER — ACETAMINOPHEN 325 MG PO TABS
650.0000 mg | ORAL_TABLET | ORAL | Status: DC | PRN
  Administered 2022-03-29: 650 mg via ORAL
  Filled 2022-03-29: qty 2

## 2022-03-29 MED ORDER — LACTATED RINGERS IV BOLUS
1000.0000 mL | Freq: Once | INTRAVENOUS | Status: AC
  Administered 2022-03-29: 1000 mL via INTRAVENOUS

## 2022-03-29 MED ORDER — BUPRENORPHINE HCL-NALOXONE HCL 2-0.5 MG SL SUBL: 2.0000 | SUBLINGUAL_TABLET | Freq: Every day | SUBLINGUAL | Status: DC

## 2022-03-29 MED ORDER — BUPRENORPHINE HCL-NALOXONE HCL 8-2 MG SL SUBL
1.0000 | SUBLINGUAL_TABLET | Freq: Three times a day (TID) | SUBLINGUAL | Status: DC
  Administered 2022-03-29 – 2022-03-30 (×3): 1 via SUBLINGUAL
  Filled 2022-03-29 (×3): qty 1

## 2022-03-29 MED ORDER — SODIUM CHLORIDE 0.9% FLUSH: 3.0000 mL | INTRAVENOUS | Status: DC | PRN

## 2022-03-29 MED ORDER — CALCIUM CARBONATE ANTACID 500 MG PO CHEW: 2.0000 | CHEWABLE_TABLET | ORAL | Status: DC | PRN

## 2022-03-29 MED ORDER — SODIUM CHLORIDE 0.9 % IV SOLN
12.5000 mg | Freq: Four times a day (QID) | INTRAVENOUS | Status: DC | PRN
  Filled 2022-03-29: qty 0.5

## 2022-03-29 MED ORDER — BUPRENORPHINE HCL-NALOXONE HCL 2-0.5 MG SL SUBL: 2.0000 | SUBLINGUAL_TABLET | Freq: Every evening | SUBLINGUAL | Status: DC | PRN

## 2022-03-29 MED ORDER — LACTATED RINGERS IV SOLN: INTRAVENOUS | Status: DC

## 2022-03-29 MED ORDER — METRONIDAZOLE 0.75 % VA GEL
1.0000 | Freq: Every day | VAGINAL | Status: DC
  Administered 2022-03-29: 1 via VAGINAL
  Filled 2022-03-29: qty 70

## 2022-03-29 MED ORDER — ASPIRIN 81 MG PO TBEC
81.0000 mg | DELAYED_RELEASE_TABLET | Freq: Every day | ORAL | Status: DC
  Administered 2022-03-29 – 2022-03-30 (×2): 81 mg via ORAL
  Filled 2022-03-29 (×2): qty 1

## 2022-03-29 MED ORDER — DOCUSATE SODIUM 100 MG PO CAPS
100.0000 mg | ORAL_CAPSULE | Freq: Every day | ORAL | Status: DC
  Administered 2022-03-30: 100 mg via ORAL
  Filled 2022-03-29: qty 1
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
## 2022-03-29 NOTE — MAU Note (Signed)
Pt needing to leave to pick up her son from school. Pt will return per Dr Allen Derry recommendation for admission. AMA form signed.
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
## 2022-03-29 NOTE — MAU Note (Signed)
.  Chloe Rodgers is a 31 y.o. at [redacted]w[redacted]d here in MAU reporting: Pelvic pressure for 4-5 days and leaking clear to yellow fouid. Also stated her Blood sugar has been elveated in the 200's. Was told to come to hospital sevral dasy ago for these complaints but she has been"putting it off" has no felt baby move today.  LMP:  Onset of complaint: 4-5 days.  Pain score: 10 There were no vitals filed for this visit.   FHT:144 Lab orders placed from triage:  u/a

## 2022-03-30 ENCOUNTER — Ambulatory Visit: Payer: Medicaid Other

## 2022-03-30 DIAGNOSIS — O368190 Decreased fetal movements, unspecified trimester, not applicable or unspecified: Secondary | ICD-10-CM | POA: Diagnosis present

## 2022-03-30 DIAGNOSIS — O368130 Decreased fetal movements, third trimester, not applicable or unspecified: Secondary | ICD-10-CM | POA: Diagnosis not present

## 2022-03-30 LAB — GC/CHLAMYDIA PROBE AMP (~~LOC~~) NOT AT ARMC
Chlamydia: NEGATIVE
Comment: NEGATIVE
Comment: NORMAL
Neisseria Gonorrhea: NEGATIVE

## 2022-03-30 LAB — COMPREHENSIVE METABOLIC PANEL
ALT: 64 U/L — ABNORMAL HIGH (ref 0–44)
AST: 36 U/L (ref 15–41)
Albumin: 2.6 g/dL — ABNORMAL LOW (ref 3.5–5.0)
Alkaline Phosphatase: 77 U/L (ref 38–126)
Anion gap: 9 (ref 5–15)
BUN: 6 mg/dL (ref 6–20)
CO2: 22 mmol/L (ref 22–32)
Calcium: 8.9 mg/dL (ref 8.9–10.3)
Chloride: 106 mmol/L (ref 98–111)
Creatinine, Ser: 0.73 mg/dL (ref 0.44–1.00)
GFR, Estimated: >60 mL/min (ref >60)
Glucose, Bld: 102 mg/dL — ABNORMAL HIGH (ref 70–99)
Potassium: 3.6 mmol/L (ref 3.5–5.1)
Sodium: 137 mmol/L (ref 135–145)
Total Bilirubin: 0.5 mg/dL (ref 0.3–1.2)
Total Protein: 6 g/dL — ABNORMAL LOW (ref 6.5–8.1)

## 2022-03-30 LAB — CBC
HCT: 25.9 % — ABNORMAL LOW (ref 36.0–46.0)
Hemoglobin: 8.5 g/dL — ABNORMAL LOW (ref 12.0–15.0)
MCH: 26.2 pg (ref 26.0–34.0)
MCHC: 32.8 g/dL (ref 30.0–36.0)
MCV: 79.9 fL — ABNORMAL LOW (ref 80.0–100.0)
Platelets: 147 10*3/uL — ABNORMAL LOW (ref 150–400)
RBC: 3.24 MIL/uL — ABNORMAL LOW (ref 3.87–5.11)
RDW: 14.6 % (ref 11.5–15.5)
WBC: 5.6 10*3/uL (ref 4.0–10.5)
nRBC: 0 % (ref 0.0–0.2)

## 2022-03-30 LAB — TYPE AND SCREEN
ABO/RH(D): A POS
Antibody Screen: NEGATIVE

## 2022-03-30 LAB — GLUCOSE, CAPILLARY: Glucose-Capillary: 108 mg/dL — ABNORMAL HIGH (ref 70–99)

## 2022-03-30 NOTE — Progress Notes (Signed)
CSW completed chart review and attempted to meet with MOB. When CSW arrived, RN made CSW aware that MOB had discharged.   A CSW will meet with MOB post delivery.  Laurey Arrow, MSW, LCSW Clinical Social Work [phone]

## 2022-03-30 NOTE — Discharge Summary (Signed)
Patient ID: Chloe Rodgers MRN: [id number] DOB/AGE: [birth year] 31 y.o.  Admit date: 03/29/2022 Discharge date: 03/30/2022  Admission Diagnoses: IUP 31 1/7 weeks, BPP 6/8  Discharge Diagnoses: SAA, undelivered IUP 32 2/7 and reassuring antenatal testing  Prenatal Procedures: BPP and NSt    Hospital Course:  This is a 31 y.o. [id number] with IUP at 51w2dadmitted for 6/8 BPP. She was monitored and had a reactive NST. Repeat BPP was 8/8. Pt reported good fetal movement and had no OB complaints. She was maintained on her home meds.   She was deemed stable for discharge to home with outpatient follow up.  Discharge Exam: Temp:  [97.9 F (36.6 C)-98.3 F (36.8 C)] 98.3 F (36.8 C) (09/19 0757) Pulse Rate:  [84-111] 91 (09/19 0757) Resp:  [16-18] 16 (09/19 0757) BP: (105-131)/(53-81) 117/81 (09/19 0757) SpO2:  [99 %-100 %] 100 % (09/19 0757) Weight:  [117.9 kg] 117.9 kg (09/18 1047) Physical Examination: CONSTITUTIONAL: Well-developed, well-nourished female in no acute distress.  HENT:  Normocephalic, atraumatic, External right and left ear normal. Oropharynx is clear and moist EYES: Conjunctivae and EOM are normal. Pupils are equal, round, and reactive to light. No scleral icterus.  NECK: Normal range of motion, supple, no masses SKIN: Skin is warm and dry. No rash noted. Not diaphoretic. No erythema. No pallor. [place] Alert and oriented to person, place, and time. Normal reflexes, muscle tone coordination. No cranial nerve deficit noted. PSYCHIATRIC: Normal mood and affect. Normal behavior. Normal judgment and thought content. CARDIOVASCULAR: Normal heart rate noted, regular rhythm RESPIRATORY: Effort and breath sounds normal, no problems with respiration noted MUSCULOSKELETAL: Normal range of motion. No edema and no tenderness. 2+ distal pulses. ABDOMEN: Soft, nontender, nondistended, gravid. CERVIX:  no evaluated  Fetal monitoring: FHR: 130 bpm, Variability: moderate,  Accelerations: Present, Decelerations: Absent  Uterine activity: no contractions per hour  Significant Diagnostic Studies:  Results for orders placed or performed during the hospital encounter of 03/29/22 (from the past 168 hour(s))  Glucose, capillary   Collection Time: 03/29/22  5:58 PM  Result Value Ref Range   Glucose-Capillary 77 70 - 99 mg/dL  Glucose, capillary   Collection Time: 03/29/22  8:39 PM  Result Value Ref Range   Glucose-Capillary 111 (H) 70 - 99 mg/dL  CBC   Collection Time: 03/29/22 11:16 PM  Result Value Ref Range   WBC 5.6 4.0 - 10.5 K/uL   RBC 3.24 (L) 3.87 - 5.11 MIL/uL   Hemoglobin 8.5 (L) 12.0 - 15.0 g/dL   HCT 25.9 (L) 36.0 - 46.0 %   MCV 79.9 (L) 80.0 - 100.0 fL   MCH 26.2 26.0 - 34.0 pg   MCHC 32.8 30.0 - 36.0 g/dL   RDW 14.6 11.5 - 15.5 %   Platelets 147 (L) 150 - 400 K/uL   nRBC 0.0 0.0 - 0.2 %  Comprehensive metabolic panel   Collection Time: 03/29/22 11:16 PM  Result Value Ref Range   Sodium 137 135 - 145 mmol/L   Potassium 3.6 3.5 - 5.1 mmol/L   Chloride 106 98 - 111 mmol/L   CO2 22 22 - 32 mmol/L   Glucose, Bld 102 (H) 70 - 99 mg/dL   BUN 6 6 - 20 mg/dL   Creatinine, Ser 0.73 0.44 - 1.00 mg/dL   Calcium 8.9 8.9 - 10.3 mg/dL   Total Protein 6.0 (L) 6.5 - 8.1 g/dL   Albumin 2.6 (L) 3.5 - 5.0 g/dL   AST 36 15 - 41 U/L  ALT 64 (H) 0 - 44 U/L   Alkaline Phosphatase 77 38 - 126 U/L   Total Bilirubin 0.5 0.3 - 1.2 mg/dL   GFR, Estimated >60 >60 mL/min   Anion gap 9 5 - 15  Type and screen [place]   Collection Time: 03/29/22 11:16 PM  Result Value Ref Range   ABO/RH(D) A POS    Antibody Screen NEG    Sample Expiration      04/01/2022,2359 Performed at [facility], [place] [address], [place], [place] [address]   Glucose, capillary   Collection Time: 03/30/22  5:18 AM  Result Value Ref Range   Glucose-Capillary 108 (H) 70 - 99 mg/dL  Results for orders placed or performed during the hospital encounter of  03/29/22 (from the past 168 hour(s))  Glucose, capillary   Collection Time: 03/29/22 11:13 AM  Result Value Ref Range   Glucose-Capillary 91 70 - 99 mg/dL  Urinalysis, Routine w reflex microscopic Urine, Clean Catch   Collection Time: 03/29/22 11:34 AM  Result Value Ref Range   Color, Urine AMBER (A) YELLOW   APPearance CLOUDY (A) CLEAR   Specific Gravity, Urine 1.020 1.005 - 1.030   pH 6.5 5.0 - 8.0   Glucose, UA NEGATIVE NEGATIVE mg/dL   Hgb urine dipstick NEGATIVE NEGATIVE   Bilirubin Urine SMALL (A) NEGATIVE   Ketones, ur NEGATIVE NEGATIVE mg/dL   Protein, ur 30 (A) NEGATIVE mg/dL   Nitrite NEGATIVE NEGATIVE   Leukocytes,Ua SMALL (A) NEGATIVE  Urinalysis, Microscopic (reflex)   Collection Time: 03/29/22 11:34 AM  Result Value Ref Range   RBC / HPF NONE SEEN 0 - 5 RBC/hpf   WBC, UA 6-10 0 - 5 WBC/hpf   Bacteria, UA MANY (A) NONE SEEN   Squamous Epithelial / LPF 21-50 0 - 5   Mucus PRESENT   Wet prep, genital   Collection Time: 03/29/22 12:01 PM  Result Value Ref Range   Yeast Wet Prep HPF POC NONE SEEN NONE SEEN   Trich, Wet Prep NONE SEEN NONE SEEN   Clue Cells Wet Prep HPF POC NONE SEEN NONE SEEN   WBC, Wet Prep HPF POC <10 <10   Sperm NONE SEEN   Results for orders placed or performed in visit on 03/24/22 (from the past 168 hour(s))  ToxASSURE Select 13 (MW), Urine   Collection Time: 03/24/22  3:17 PM  Result Value Ref Range   Summary Note   Cervicovaginal ancillary only( [place])   Collection Time: 03/24/22  4:49 PM  Result Value Ref Range   Neisseria Gonorrhea Negative    Chlamydia Negative    Trichomonas Negative    Bacterial Vaginitis (gardnerella) Positive (A)    Candida Vaginitis Positive (A)    Candida Glabrata Negative    Comment Normal Reference Range Candida Species - Negative    Comment Normal Reference Range Candida Galbrata - Negative    Comment Normal Reference Range Trichomonas - Negative    Comment Normal Reference [place] Chlamydia -  Negative    Comment      Normal Reference Range Neisseria Gonorrhea - Negative   Comment      Normal Reference Range Bacterial Vaginosis - Negative    Discharge Condition: Stable  Disposition: Discharge disposition: 01-Home or Self Care        Discharge Instructions     Discharge activity:  No Restrictions   Complete by: As directed    Discharge diet:  No restrictions   Complete  by: As directed    Fetal Kick Count:  Lie on our left side for one hour after a meal, and count the number of times your baby kicks.  If it is less than 5 times, get up, move around and drink some juice.  Repeat the test 30 minutes later.  If it is still less than 5 kicks in an hour, notify your doctor.   Complete by: As directed    No sexual activity restrictions   Complete by: As directed    Notify physician for a general feeling that "something is not right"   Complete by: As directed    Notify physician for increase or change in vaginal discharge   Complete by: As directed    Notify physician for intestinal cramps, with or without diarrhea, sometimes described as "gas pain"   Complete by: As directed    Notify physician for leaking of fluid   Complete by: As directed    Notify physician for low, dull backache, unrelieved by heat or Tylenol   Complete by: As directed    Notify physician for menstrual like cramps   Complete by: As directed    Notify physician for pelvic pressure   Complete by: As directed    Notify physician for uterine contractions.  These may be painless and feel like the uterus is tightening or the baby is  "balling up"   Complete by: As directed    Notify physician for vaginal bleeding   Complete by: As directed    PRETERM LABOR:  Includes any of the follwing symptoms that occur between 20 - [redacted] weeks gestation.  If these symptoms are not stopped, preterm labor can result in preterm delivery, placing your baby at risk   Complete by: As directed       Allergies as of  03/30/2022       Reactions   Naproxen Itching   Toradol [ketorolac Tromethamine] Itching        Medication List     TAKE these medications    Accu-Chek Guide test strip Generic drug: glucose blood Use as instructed   Accu-Chek Guide w/Device Kit 1 Device by Does not apply route 4 (four) times daily.   Accu-Chek Softclix Lancets lancets Use as instructed   acetaminophen 325 MG tablet Commonly known as: TYLENOL Take 650 mg by mouth every 6 (six) hours as needed.   aspirin EC 81 MG tablet Take 1 tablet (81 mg total) by mouth daily. Swallow whole.   Buprenorphine HCl-Naloxone HCl 8-2 MG Film Commonly known as: Suboxone Take 1 film in the morning and 1/2 of a film in the evening   metFORMIN 1000 MG tablet Commonly known as: GLUCOPHAGE Take 1 tablet (1,000 mg total) by mouth 2 (two) times daily with a meal.   metroNIDAZOLE 500 MG tablet Commonly known as: Flagyl Take 1 tablet (500 mg total) by mouth 2 (two) times daily for 7 days.   ondansetron 4 MG disintegrating tablet Commonly known as: ZOFRAN-ODT Take 1 tablet (4 mg total) by mouth every 6 (six) hours as needed for nausea.        Follow-up [place] for [facility] Follow up.   Specialty: Obstetrics and Gynecology Why: Pt already has scheduled OB appt Contact information: [place] [address] [phone]                Signed: Chancy Milroy M.D. 03/30/2022, 10:10  AM  

## 2022-03-31 ENCOUNTER — Encounter: Payer: Self-pay | Admitting: Family Medicine

## 2022-04-01 ENCOUNTER — Telehealth: Payer: Self-pay | Admitting: Family Medicine

## 2022-04-01 ENCOUNTER — Other Ambulatory Visit (HOSPITAL_BASED_OUTPATIENT_CLINIC_OR_DEPARTMENT_OTHER): Payer: Self-pay

## 2022-04-01 MED ORDER — BUPRENORPHINE HCL-NALOXONE HCL 8-2 MG SL FILM: ORAL_FILM | SUBLINGUAL | 0 refills | Status: DC

## 2022-04-01 NOTE — Telephone Encounter (Signed)
Spoke with Emelia Loron PA at [facility] location regarding our mutual patient.  I called him to discuss possible change to Chloe Rodgers's dose of suboxone which he is currently managing. He does not usually go above 24 mg but after discussion we agreed he could trial an additional 4 mg nightly to see if this would better cover her pain and withdrawal symptoms.   Clarnce Flock, MD/MPH Attending Family Medicine Physician, [place] for [facility] [place] [facility]  10:59 AM 04/01/22

## 2022-04-06 ENCOUNTER — Encounter: Payer: Self-pay | Admitting: Family Medicine

## 2022-04-06 ENCOUNTER — Other Ambulatory Visit (HOSPITAL_COMMUNITY)
Admission: RE | Admit: 2022-04-06 | Discharge: 2022-04-06 | Disposition: A | Discharge status: Home / Self Care | Payer: Medicaid Other | Source: Ambulatory Visit | Attending: Family Medicine | Admitting: Family Medicine

## 2022-04-06 ENCOUNTER — Ambulatory Visit (INDEPENDENT_AMBULATORY_CARE_PROVIDER_SITE_OTHER): Payer: Medicaid Other | Admitting: Family Medicine

## 2022-04-06 ENCOUNTER — Other Ambulatory Visit: Payer: Self-pay

## 2022-04-06 VITALS — BP 109/68 | HR 102 | Wt 261.2 lb

## 2022-04-06 DIAGNOSIS — O0990 Supervision of high risk pregnancy, unspecified, unspecified trimester: Secondary | ICD-10-CM

## 2022-04-06 DIAGNOSIS — G40909 Epilepsy, unspecified, not intractable, without status epilepticus: Secondary | ICD-10-CM

## 2022-04-06 DIAGNOSIS — O99213 Obesity complicating pregnancy, third trimester: Secondary | ICD-10-CM

## 2022-04-06 DIAGNOSIS — Z3A33 33 weeks gestation of pregnancy: Secondary | ICD-10-CM

## 2022-04-06 DIAGNOSIS — O368130 Decreased fetal movements, third trimester, not applicable or unspecified: Secondary | ICD-10-CM

## 2022-04-06 DIAGNOSIS — N898 Other specified noninflammatory disorders of vagina: Secondary | ICD-10-CM | POA: Insufficient documentation

## 2022-04-06 DIAGNOSIS — O24410 Gestational diabetes mellitus in pregnancy, diet controlled: Secondary | ICD-10-CM

## 2022-04-06 DIAGNOSIS — F1120 Opioid dependence, uncomplicated: Secondary | ICD-10-CM

## 2022-04-06 DIAGNOSIS — O0993 Supervision of high risk pregnancy, unspecified, third trimester: Secondary | ICD-10-CM

## 2022-04-06 DIAGNOSIS — Z98891 History of uterine scar from previous surgery: Secondary | ICD-10-CM

## 2022-04-06 DIAGNOSIS — R8271 Bacteriuria: Secondary | ICD-10-CM

## 2022-04-06 DIAGNOSIS — O99210 Obesity complicating pregnancy, unspecified trimester: Secondary | ICD-10-CM

## 2022-04-06 DIAGNOSIS — Z1331 Encounter for screening for depression: Secondary | ICD-10-CM

## 2022-04-06 MED ORDER — "INSULIN SYRINGE 31G X 5/16"" 0.3 ML MISC": 1.0000 | 11 refills | Status: DC | PRN

## 2022-04-06 MED ORDER — INSULIN NPH (HUMAN) (ISOPHANE) 100 UNIT/ML ~~LOC~~ SUSP: 10.0000 [IU] | Freq: Two times a day (BID) | SUBCUTANEOUS | 3 refills | Status: DC

## 2022-04-06 NOTE — Progress Notes (Signed)
Subjective:  Chloe Rodgers is a 31 y.o. [phone] at [redacted]w[redacted]d being seen today for ongoing prenatal care.  She is currently monitored for the following issues for this high-risk pregnancy and has Seizure disorder ([place]); Family history of breast cancer in mother; Obese; Migraines; Nephrolithiasis; Chronic PID (chronic pelvic inflammatory disease); Family history of breast cancer; Family history of colon cancer; Genetic testing; Supervision of high risk pregnancy, antepartum; Uncomplicated opioid dependence ([place]); Obesity in pregnancy, antepartum; History of cesarean section; GBS bacteriuria; GDM (gestational diabetes mellitus); and Decreased fetal movement on their problem list.  Patient reports  decreased fetal movement and bleeding when wiping .  Contractions: Irregular. Vag. Bleeding: Scant.  Movement: (!) Decreased. Denies leaking of fluid.   The following portions of the patient's history were reviewed and updated as appropriate: allergies, current medications, past family history, past medical history, past social history, past surgical history and problem list. Problem list updated.  Objective:   Vitals:   04/06/22 1006  BP: 109/68  Pulse: (!) 102  Weight: 261 lb 3.2 oz (118.5 kg)    Fetal Status: Fetal Heart Rate (bpm): 137   Movement: (!) Decreased     General:  Alert, oriented and cooperative. Patient is in no acute distress.  Skin: Skin is warm and dry. No rash noted.   Cardiovascular: Normal heart rate noted  Respiratory: Normal respiratory effort, no problems with respiration noted  Abdomen: Soft, gravid, appropriate for gestational age. Pain/Pressure: Present     Pelvic: Vag. Bleeding: Scant     Pelvic exam shows *         Extremities: Normal range of motion.  Edema: None  Mental Status: Normal mood and affect. Normal behavior. Normal judgment and thought content.   Urinalysis:        Assessment and Plan:  Pregnancy: [id number] at [redacted]w[redacted]d  1. Supervision of high risk  pregnancy, antepartum BP and FHR normal Had some spotting yesterday when wiping, no intercourse for several weeks SVE shows some frothy whitish/yellowish discharge, swab collected  2. History of cesarean section Scheduled for RCS+BTL  3. Positive screening for depression on 9-item Patient Health Questionnaire (PHQ-9) Accepts referral to [facility] - Ambulatory referral to [place]  4. GBS bacteriuria Ppx w surgery  5. Decreased fetal movements in third trimester, single or unspecified fetus Has not been moving like normal for the past few days NST performed and was reactive after vibroacoustic stimulation Baseline 135 Moderate variability +15x15 accels x2 No decels Toco quiet  6. Uncomplicated opioid dependence ([place]) Spoke with patient's suboxone provider last week, he agreed to trial of increased evening dose above 24 mg Patient reports she does not have visit until next month, advised her to speak with him asap to discuss starting this sooner  7. Obesity in pregnancy, antepartum   8. Seizure disorder [facility]) Will refer to neurologist after delivery  9. Diet controlled gestational diabetes mellitus (GDM) in third trimester Reports she is taking metformin 1g BID, unfortunately sugars still not well controlled Last [place] on 03/29/2022 showed EFW 88%, AFI 14 Does not appear to have follow up US scheduled, order placed Discussed we will start NPH 10u BID, needs to have 3 regular meals a day with snacks and check sugars more often Scheduled with DM educator in one week Reviewed we may also need to move up her CS+BTL date depending on how things look at next visit given multiple signs of poorly controlled GDM  Preterm labor symptoms and general obstetric precautions including  but not limited to vaginal bleeding, contractions, leaking of fluid and fetal movement were reviewed in detail with the patient. Please refer to After Visit Summary for other counseling  recommendations.  Return for [facility], ob visit.   Clarnce Flock, MD

## 2022-04-06 NOTE — Patient Instructions (Signed)

## 2022-04-07 LAB — CERVICOVAGINAL ANCILLARY ONLY
Bacterial Vaginitis (gardnerella): POSITIVE — AB
Candida Glabrata: NEGATIVE
Candida Vaginitis: NEGATIVE
Chlamydia: NEGATIVE
Comment: NEGATIVE
Comment: NEGATIVE
Comment: NEGATIVE
Comment: NEGATIVE
Comment: NEGATIVE
Comment: NORMAL
Neisseria Gonorrhea: NEGATIVE
Trichomonas: NEGATIVE

## 2022-04-08 MED ORDER — METRONIDAZOLE 0.75 % VA GEL: 1.0000 | Freq: Every day | VAGINAL | 0 refills | Status: AC

## 2022-04-08 NOTE — Addendum Note (Signed)
Addended by: Clayton Lefort on: 04/08/2022 11:37 AM   Modules accepted: Orders

## 2022-04-09 ENCOUNTER — Telehealth: Payer: Self-pay | Admitting: Pediatrics

## 2022-04-12 ENCOUNTER — Encounter: Payer: Self-pay | Admitting: Family Medicine

## 2022-04-13 ENCOUNTER — Other Ambulatory Visit: Payer: Medicaid Other

## 2022-04-14 ENCOUNTER — Encounter: Payer: Medicaid Other | Admitting: Family Medicine

## 2022-04-20 ENCOUNTER — Encounter: Payer: Self-pay | Admitting: Family Medicine

## 2022-04-20 ENCOUNTER — Other Ambulatory Visit: Payer: Self-pay

## 2022-04-20 ENCOUNTER — Ambulatory Visit (INDEPENDENT_AMBULATORY_CARE_PROVIDER_SITE_OTHER): Payer: Medicaid Other | Admitting: Family Medicine

## 2022-04-20 VITALS — BP 136/82 | HR 96 | Wt 258.8 lb

## 2022-04-20 DIAGNOSIS — Z98891 History of uterine scar from previous surgery: Secondary | ICD-10-CM

## 2022-04-20 DIAGNOSIS — O99210 Obesity complicating pregnancy, unspecified trimester: Secondary | ICD-10-CM

## 2022-04-20 DIAGNOSIS — O0990 Supervision of high risk pregnancy, unspecified, unspecified trimester: Secondary | ICD-10-CM

## 2022-04-20 DIAGNOSIS — R8271 Bacteriuria: Secondary | ICD-10-CM

## 2022-04-20 DIAGNOSIS — O163 Unspecified maternal hypertension, third trimester: Secondary | ICD-10-CM

## 2022-04-20 DIAGNOSIS — O0993 Supervision of high risk pregnancy, unspecified, third trimester: Secondary | ICD-10-CM

## 2022-04-20 DIAGNOSIS — Z3A35 35 weeks gestation of pregnancy: Secondary | ICD-10-CM

## 2022-04-20 DIAGNOSIS — O24410 Gestational diabetes mellitus in pregnancy, diet controlled: Secondary | ICD-10-CM

## 2022-04-20 DIAGNOSIS — F1120 Opioid dependence, uncomplicated: Secondary | ICD-10-CM

## 2022-04-20 DIAGNOSIS — G40909 Epilepsy, unspecified, not intractable, without status epilepticus: Secondary | ICD-10-CM

## 2022-04-20 MED ORDER — INSULIN NPH (HUMAN) (ISOPHANE) 100 UNIT/ML ~~LOC~~ SUSP: 12.0000 [IU] | Freq: Two times a day (BID) | SUBCUTANEOUS | 3 refills | Status: DC

## 2022-04-20 NOTE — Progress Notes (Signed)
   Subjective:  Chloe Rodgers is a 31 y.o. [phone] at [redacted]w[redacted]d being seen today for ongoing prenatal care.  She is currently monitored for the following issues for this high-risk pregnancy and has Seizure disorder ([place]); Family history of breast cancer in mother; Obese; Migraines; Nephrolithiasis; Chronic PID (chronic pelvic inflammatory disease); Family history of breast cancer; Family history of colon cancer; Genetic testing; Supervision of high risk pregnancy, antepartum; Uncomplicated opioid dependence ([place]); Obesity in pregnancy, antepartum; History of cesarean section; GBS bacteriuria; GDM (gestational diabetes mellitus); and Decreased fetal movement on their problem list.  Patient reports no complaints.  Contractions: Irregular. Vag. Bleeding: None.  Movement: Present. Denies leaking of fluid.   The following portions of the patient's history were reviewed and updated as appropriate: allergies, current medications, past family history, past medical history, past social history, past surgical history and problem list. Problem list updated.  Objective:   Vitals:   04/20/22 0838 04/20/22 0900  BP: (!) 150/83 136/82  Pulse: 96   Weight: 258 lb 12.8 oz (117.4 kg)     Fetal Status: Fetal Heart Rate (bpm): 149   Movement: Present     General:  Alert, oriented and cooperative. Patient is in no acute distress.  Skin: Skin is warm and dry. No rash noted.   Cardiovascular: Normal heart rate noted  Respiratory: Normal respiratory effort, no problems with respiration noted  Abdomen: Soft, gravid, appropriate for gestational age. Pain/Pressure: Present     Pelvic: Vag. Bleeding: None     Cervical exam deferred        Extremities: Normal range of motion.  Edema: None  Mental Status: Normal mood and affect. Normal behavior. Normal judgment and thought content.   Urinalysis:        Assessment and Plan:  Pregnancy: [id number] at [redacted]w[redacted]d  1. Supervision of high risk pregnancy, antepartum Initial BP  mild range, repeat normal FHR normal Labs sent  2. Diet controlled gestational diabetes mellitus (GDM) in third trimester Sugars remain largely uncontrolled, see photo Admits to dietary indiscretion, some sugars very elevated but many are close to goal She is taking metformin 1g BID and NPH 10u BID Will increase to NPH 12u BID Following w MFM, has follow up US on 04/27/2022 Given poor control will move repeat CS up to 37 weeks, message sent, pre-op orders placed Has not been getting weekly BPP's since last month Has WIC appt at 10 AM, will call her with appt for later this week  3. Seizure disorder ([place]) Plan to see neuro postpartum  4. Uncomplicated opioid dependence ([place]) Sees outside physician curr  5. Obesity in pregnancy, antepartum   6. History of cesarean section X3, scheduled for RCS+BTL BTL papers signed 03/24/2022  7. GBS bacteriuria Ppx at time of surgery  Preterm labor symptoms and general obstetric precautions including but not limited to vaginal bleeding, contractions, leaking of fluid and fetal movement were reviewed in detail with the patient. Please refer to After Visit Summary for other counseling recommendations.  Return for [facility], ob visit, needs MD.   Clarnce Flock, MD

## 2022-04-20 NOTE — Progress Notes (Signed)
Declined IBH services PHQ-9 : 24. Answereed 0 to question 9 GAD 7- 20

## 2022-04-21 ENCOUNTER — Telehealth: Payer: Self-pay | Admitting: Family Medicine

## 2022-04-21 LAB — COMPREHENSIVE METABOLIC PANEL
ALT: 46 IU/L — ABNORMAL HIGH (ref 0–32)
AST: 27 IU/L (ref 0–40)
Albumin/Globulin Ratio: 1.2 (ref 1.2–2.2)
Albumin: 3.9 g/dL (ref 3.9–4.9)
Alkaline Phosphatase: 133 IU/L — ABNORMAL HIGH (ref 44–121)
BUN/Creatinine Ratio: 7 — ABNORMAL LOW (ref 9–23)
BUN: 5 mg/dL — ABNORMAL LOW (ref 6–20)
Bilirubin Total: 0.4 mg/dL (ref 0.0–1.2)
CO2: 19 mmol/L — ABNORMAL LOW (ref 20–29)
Calcium: 9.3 mg/dL (ref 8.7–10.2)
Chloride: 100 mmol/L (ref 96–106)
Creatinine, Ser: 0.71 mg/dL (ref 0.57–1.00)
Globulin, Total: 3.3 g/dL (ref 1.5–4.5)
Glucose: 110 mg/dL — ABNORMAL HIGH (ref 70–99)
Potassium: 3.8 mmol/L (ref 3.5–5.2)
Sodium: 137 mmol/L (ref 134–144)
Total Protein: 7.2 g/dL (ref 6.0–8.5)
eGFR: 117 mL/min/{1.73_m2} (ref >59)

## 2022-04-21 LAB — CBC
Hematocrit: 30.9 % — ABNORMAL LOW (ref 34.0–46.6)
Hemoglobin: 9.9 g/dL — ABNORMAL LOW (ref 11.1–15.9)
MCH: 24.9 pg — ABNORMAL LOW (ref 26.6–33.0)
MCHC: 32 g/dL (ref 31.5–35.7)
MCV: 78 fL — ABNORMAL LOW (ref 79–97)
Platelets: 168 10*3/uL (ref 150–450)
RBC: 3.98 x10E6/uL (ref 3.77–5.28)
RDW: 14.4 % (ref 11.7–15.4)
WBC: 7 10*3/uL (ref 3.4–10.8)

## 2022-04-21 NOTE — Telephone Encounter (Signed)
Call placed back to pt. Spoke with pt. Pt given update on C/S Date change per [place]. Pt verbalized understanding.  Colletta Maryland, RNC

## 2022-04-21 NOTE — Telephone Encounter (Signed)
Patient called in wanting to know if her c-section had been changed yet. She was told at her appointment yesterday it would be moved to 10/23 and it is not showing up on her mychart and she is wanting to know what time she needs to be there.

## 2022-04-22 ENCOUNTER — Encounter: Payer: Self-pay | Admitting: Family Medicine

## 2022-04-23 ENCOUNTER — Encounter (HOSPITAL_COMMUNITY): Payer: Self-pay

## 2022-04-23 NOTE — Patient Instructions (Addendum)
Chloe Rodgers  04/23/2022   Your procedure is scheduled on:  05/03/2022  Arrive at 84 at Entrance C on [address] at [facility]  and [address]. You are invited to use the FREE valet parking or use the Visitor's parking deck.  Pick up the phone at the desk and dial [phone].  Call this number if you have problems the morning of surgery: [phone]  Remember:   Do not eat food:(After Midnight) Desps de medianoche.  Do not drink clear liquids: (After Midnight) Desps de medianoche.  Take these medicines the morning of surgery with A SIP OF WATER:  Take half of bedtime insulin the night before surgery.  Take suboxone as prescribed. No insulin on day of surgery   Do not wear jewelry, make-up or nail polish.  Do not wear lotions, powders, or perfumes. Do not wear deodorant.  Do not shave 48 hours prior to surgery.  Do not bring valuables to the hospital.  [facility] is not   responsible for any belongings or valuables brought to the hospital.  Contacts, dentures or bridgework may not be worn into surgery.  Leave suitcase in the car. After surgery it may be brought to your room.  For patients admitted to the hospital, checkout time is 11:00 AM the day of              discharge.      Please read over the following fact sheets that you were given:     Preparing for Surgery

## 2022-04-26 ENCOUNTER — Encounter: Payer: Self-pay | Admitting: Family Medicine

## 2022-04-27 ENCOUNTER — Ambulatory Visit: Payer: Medicaid Other | Admitting: *Deleted

## 2022-04-27 ENCOUNTER — Other Ambulatory Visit (HOSPITAL_COMMUNITY)
Admission: RE | Admit: 2022-04-27 | Discharge: 2022-04-27 | Disposition: A | Discharge status: Home / Self Care | Payer: Medicaid Other | Source: Ambulatory Visit | Attending: Family Medicine | Admitting: Family Medicine

## 2022-04-27 ENCOUNTER — Other Ambulatory Visit: Payer: Self-pay | Admitting: Family Medicine

## 2022-04-27 ENCOUNTER — Ambulatory Visit: Payer: Medicaid Other | Attending: Family Medicine

## 2022-04-27 ENCOUNTER — Ambulatory Visit (INDEPENDENT_AMBULATORY_CARE_PROVIDER_SITE_OTHER): Payer: Medicaid Other | Admitting: Family Medicine

## 2022-04-27 ENCOUNTER — Encounter: Payer: Self-pay | Admitting: Family Medicine

## 2022-04-27 VITALS — BP 122/70 | HR 113

## 2022-04-27 VITALS — BP 129/84 | HR 87 | Wt 257.5 lb

## 2022-04-27 DIAGNOSIS — O0990 Supervision of high risk pregnancy, unspecified, unspecified trimester: Secondary | ICD-10-CM | POA: Insufficient documentation

## 2022-04-27 DIAGNOSIS — Z98891 History of uterine scar from previous surgery: Secondary | ICD-10-CM

## 2022-04-27 DIAGNOSIS — R8271 Bacteriuria: Secondary | ICD-10-CM

## 2022-04-27 DIAGNOSIS — F1120 Opioid dependence, uncomplicated: Secondary | ICD-10-CM

## 2022-04-27 DIAGNOSIS — Z3A36 36 weeks gestation of pregnancy: Secondary | ICD-10-CM

## 2022-04-27 DIAGNOSIS — O24410 Gestational diabetes mellitus in pregnancy, diet controlled: Secondary | ICD-10-CM

## 2022-04-27 DIAGNOSIS — G40909 Epilepsy, unspecified, not intractable, without status epilepticus: Secondary | ICD-10-CM

## 2022-04-27 DIAGNOSIS — O99210 Obesity complicating pregnancy, unspecified trimester: Secondary | ICD-10-CM

## 2022-04-27 DIAGNOSIS — O99213 Obesity complicating pregnancy, third trimester: Secondary | ICD-10-CM

## 2022-04-27 DIAGNOSIS — O0993 Supervision of high risk pregnancy, unspecified, third trimester: Secondary | ICD-10-CM

## 2022-04-27 DIAGNOSIS — O24414 Gestational diabetes mellitus in pregnancy, insulin controlled: Secondary | ICD-10-CM

## 2022-04-27 NOTE — Progress Notes (Unsigned)
   Subjective:  Chloe Rodgers is a 31 y.o. [phone] at [redacted]w[redacted]d being seen today for ongoing prenatal care.  She is currently monitored for the following issues for this high-risk pregnancy and has Seizure disorder ([place]); Family history of breast cancer in mother; Obese; Migraines; Nephrolithiasis; Chronic PID (chronic pelvic inflammatory disease); Family history of breast cancer; Family history of colon cancer; Genetic testing; Supervision of high risk pregnancy, antepartum; Uncomplicated opioid dependence ([place]); Obesity in pregnancy, antepartum; History of cesarean section; GBS bacteriuria; GDM (gestational diabetes mellitus); and Decreased fetal movement on their problem list.  Patient reports  pain and pressure .  Contractions: Irregular. Vag. Bleeding: None.  Movement: Present. Denies leaking of fluid.   The following portions of the patient's history were reviewed and updated as appropriate: allergies, current medications, past family history, past medical history, past social history, past surgical history and problem list. Problem list updated.  Objective:   Vitals:   04/27/22 1134  BP: 129/84  Pulse: 87  Weight: 257 lb 8 oz (116.8 kg)    Fetal Status:     Movement: Present     General:  Alert, oriented and cooperative. Patient is in no acute distress.  Skin: Skin is warm and dry. No rash noted.   Cardiovascular: Normal heart rate noted  Respiratory: Normal respiratory effort, no problems with respiration noted  Abdomen: Soft, gravid, appropriate for gestational age. Pain/Pressure: Present     Pelvic: Vag. Bleeding: None     Cervical exam deferred        Extremities: Normal range of motion.  Edema: None  Mental Status: Normal mood and affect. Normal behavior. Normal judgment and thought content.   Urinalysis:      Assessment and Plan:  Pregnancy: [id number] at [redacted]w[redacted]d  1. Supervision of high risk pregnancy, antepartum BP and FHR normal - GC/Chlamydia probe amp ([place])not at  [facility]  2. Insulin controlled gestational diabetes mellitus (GDM) in third trimester Sugars remain not at goal, already scheduled for CS at 37 weeks Reports she is compliant with metformin 1g BID and NPH 12u BID Following w MFM, [place] today w EFW 79%, AFI 8.5 cm, BPP 8/8  3. Seizure disorder ([place]) Plan to see Neuro PP  4. Uncomplicated opioid dependence ([place]) UDS collected today Sees outside physician - [employer] [facility] ([facility]), Urine  5. GBS bacteriuria Ppx at time of surgery  6. History of cesarean section Scheduled for RCS+BTL at 37 weeks BTL papers signed 03/24/2022  7. Obesity in pregnancy, antepartum   Preterm labor symptoms and general obstetric precautions including but not limited to vaginal bleeding, contractions, leaking of fluid and fetal movement were reviewed in detail with the patient. Please refer to After Visit Summary for other counseling recommendations.  Return in 7 weeks (on 06/15/2022) for PP check.   Clarnce Flock, MD

## 2022-04-27 NOTE — Progress Notes (Signed)
Pt said a rush of water came out yesterday and she told her dr who told her to go to hospital. She didn't have anyone to watch her kids so she didn't go. No further leaking today.

## 2022-04-28 LAB — GC/CHLAMYDIA PROBE AMP (~~LOC~~) NOT AT ARMC
Chlamydia: NEGATIVE
Comment: NEGATIVE
Comment: NORMAL
Neisseria Gonorrhea: NEGATIVE

## 2022-04-30 ENCOUNTER — Other Ambulatory Visit (HOSPITAL_COMMUNITY)
Admission: RE | Admit: 2022-04-30 | Discharge: 2022-04-30 | Disposition: A | Discharge status: Home / Self Care | Payer: Medicaid Other | Source: Ambulatory Visit | Attending: Family Medicine | Admitting: Family Medicine

## 2022-04-30 DIAGNOSIS — Z98891 History of uterine scar from previous surgery: Secondary | ICD-10-CM | POA: Insufficient documentation

## 2022-04-30 HISTORY — DX: Other chronic pain: G89.29

## 2022-04-30 LAB — COMPREHENSIVE METABOLIC PANEL
ALT: 48 U/L — ABNORMAL HIGH (ref 0–44)
AST: 29 U/L (ref 15–41)
Albumin: 2.9 g/dL — ABNORMAL LOW (ref 3.5–5.0)
Alkaline Phosphatase: 116 U/L (ref 38–126)
Anion gap: 12 (ref 5–15)
BUN: 7 mg/dL (ref 6–20)
CO2: 20 mmol/L — ABNORMAL LOW (ref 22–32)
Calcium: 9.1 mg/dL (ref 8.9–10.3)
Chloride: 106 mmol/L (ref 98–111)
Creatinine, Ser: 0.63 mg/dL (ref 0.44–1.00)
GFR, Estimated: >60 mL/min (ref >60)
Glucose, Bld: 96 mg/dL (ref 70–99)
Potassium: 3.6 mmol/L (ref 3.5–5.1)
Sodium: 138 mmol/L (ref 135–145)
Total Bilirubin: 0.4 mg/dL (ref 0.3–1.2)
Total Protein: 7 g/dL (ref 6.5–8.1)

## 2022-04-30 LAB — CBC
HCT: 31.1 % — ABNORMAL LOW (ref 36.0–46.0)
Hemoglobin: 9.8 g/dL — ABNORMAL LOW (ref 12.0–15.0)
MCH: 24.6 pg — ABNORMAL LOW (ref 26.0–34.0)
MCHC: 31.5 g/dL (ref 30.0–36.0)
MCV: 77.9 fL — ABNORMAL LOW (ref 80.0–100.0)
Platelets: 178 10*3/uL (ref 150–400)
RBC: 3.99 MIL/uL (ref 3.87–5.11)
RDW: 15 % (ref 11.5–15.5)
WBC: 6.3 10*3/uL (ref 4.0–10.5)
nRBC: 0 % (ref 0.0–0.2)

## 2022-04-30 LAB — RPR: RPR Ser Ql: NONREACTIVE

## 2022-05-01 LAB — TOXASSURE SELECT 13 (MW), URINE

## 2022-05-03 ENCOUNTER — Inpatient Hospital Stay (HOSPITAL_COMMUNITY)
Admission: RE | Admit: 2022-05-03 | Discharge: 2022-05-06 | DRG: 784 | Disposition: A | Discharge status: Home / Self Care | Payer: Medicaid Other | Attending: Family Medicine | Admitting: Family Medicine

## 2022-05-03 ENCOUNTER — Encounter (HOSPITAL_COMMUNITY): Payer: Self-pay | Admitting: Family Medicine

## 2022-05-03 ENCOUNTER — Inpatient Hospital Stay (HOSPITAL_COMMUNITY): Payer: Medicaid Other | Admitting: Anesthesiology

## 2022-05-03 ENCOUNTER — Other Ambulatory Visit: Payer: Self-pay

## 2022-05-03 ENCOUNTER — Encounter (HOSPITAL_COMMUNITY)
Admission: RE | Disposition: A | Discharge status: Home / Self Care | Payer: Self-pay | Source: Home / Self Care | Attending: Family Medicine

## 2022-05-03 DIAGNOSIS — O34211 Maternal care for low transverse scar from previous cesarean delivery: Secondary | ICD-10-CM | POA: Diagnosis not present

## 2022-05-03 DIAGNOSIS — Z3A37 37 weeks gestation of pregnancy: Secondary | ICD-10-CM

## 2022-05-03 DIAGNOSIS — Z8632 Personal history of gestational diabetes: Secondary | ICD-10-CM | POA: Diagnosis present

## 2022-05-03 DIAGNOSIS — O24424 Gestational diabetes mellitus in childbirth, insulin controlled: Secondary | ICD-10-CM | POA: Diagnosis present

## 2022-05-03 DIAGNOSIS — O99324 Drug use complicating childbirth: Secondary | ICD-10-CM | POA: Diagnosis present

## 2022-05-03 DIAGNOSIS — O24425 Gestational diabetes mellitus in childbirth, controlled by oral hypoglycemic drugs: Secondary | ICD-10-CM

## 2022-05-03 DIAGNOSIS — Z9079 Acquired absence of other genital organ(s): Secondary | ICD-10-CM

## 2022-05-03 DIAGNOSIS — Z302 Encounter for sterilization: Secondary | ICD-10-CM | POA: Diagnosis not present

## 2022-05-03 DIAGNOSIS — O99214 Obesity complicating childbirth: Secondary | ICD-10-CM | POA: Diagnosis present

## 2022-05-03 DIAGNOSIS — O24419 Gestational diabetes mellitus in pregnancy, unspecified control: Secondary | ICD-10-CM | POA: Diagnosis present

## 2022-05-03 DIAGNOSIS — O24429 Gestational diabetes mellitus in childbirth, unspecified control: Secondary | ICD-10-CM

## 2022-05-03 DIAGNOSIS — N200 Calculus of kidney: Secondary | ICD-10-CM

## 2022-05-03 DIAGNOSIS — G40909 Epilepsy, unspecified, not intractable, without status epilepticus: Secondary | ICD-10-CM

## 2022-05-03 DIAGNOSIS — Z98891 History of uterine scar from previous surgery: Principal | ICD-10-CM

## 2022-05-03 DIAGNOSIS — G43909 Migraine, unspecified, not intractable, without status migrainosus: Secondary | ICD-10-CM | POA: Diagnosis present

## 2022-05-03 DIAGNOSIS — D62 Acute posthemorrhagic anemia: Secondary | ICD-10-CM | POA: Diagnosis not present

## 2022-05-03 DIAGNOSIS — O99210 Obesity complicating pregnancy, unspecified trimester: Secondary | ICD-10-CM | POA: Diagnosis present

## 2022-05-03 DIAGNOSIS — R8271 Bacteriuria: Secondary | ICD-10-CM | POA: Diagnosis present

## 2022-05-03 DIAGNOSIS — O9081 Anemia of the puerperium: Secondary | ICD-10-CM | POA: Diagnosis not present

## 2022-05-03 DIAGNOSIS — F1120 Opioid dependence, uncomplicated: Secondary | ICD-10-CM | POA: Diagnosis present

## 2022-05-03 DIAGNOSIS — N731 Chronic parametritis and pelvic cellulitis: Secondary | ICD-10-CM | POA: Diagnosis present

## 2022-05-03 DIAGNOSIS — O99824 Streptococcus B carrier state complicating childbirth: Secondary | ICD-10-CM | POA: Diagnosis present

## 2022-05-03 DIAGNOSIS — O0990 Supervision of high risk pregnancy, unspecified, unspecified trimester: Secondary | ICD-10-CM

## 2022-05-03 LAB — GLUCOSE, CAPILLARY
Glucose-Capillary: 85 mg/dL (ref 70–99)
Glucose-Capillary: 114 mg/dL — ABNORMAL HIGH (ref 70–99)

## 2022-05-03 LAB — PREPARE RBC (CROSSMATCH)

## 2022-05-03 SURGERY — Surgical Case
Anesthesia: Spinal

## 2022-05-03 MED ORDER — BUPIVACAINE LIPOSOME 1.3 % IJ SUSP: 20.0000 mL | Freq: Once | INTRAMUSCULAR | Status: DC

## 2022-05-03 MED ORDER — HYDROMORPHONE HCL 1 MG/ML IJ SOLN
INTRAMUSCULAR | Status: AC
  Filled 2022-05-03: qty 1

## 2022-05-03 MED ORDER — BUPIVACAINE LIPOSOME 1.3 % IJ SUSP
INTRAMUSCULAR | Status: DC | PRN
  Administered 2022-05-03: 20 mL

## 2022-05-03 MED ORDER — HYDROMORPHONE HCL 1 MG/ML IJ SOLN
INTRAMUSCULAR | Status: DC | PRN
  Administered 2022-05-03: 1 mg via INTRAVENOUS

## 2022-05-03 MED ORDER — DIBUCAINE (PERIANAL) 1 % EX OINT: 1.0000 | TOPICAL_OINTMENT | CUTANEOUS | Status: DC | PRN

## 2022-05-03 MED ORDER — IBUPROFEN 600 MG PO TABS: 600.0000 mg | ORAL_TABLET | Freq: Four times a day (QID) | ORAL | Status: DC | PRN

## 2022-05-03 MED ORDER — LACTATED RINGERS IV SOLN: INTRAVENOUS | Status: DC

## 2022-05-03 MED ORDER — HYDROMORPHONE 1 MG/ML IV SOLN
INTRAVENOUS | Status: DC
  Administered 2022-05-04: 0.3 mg via INTRAVENOUS
  Administered 2022-05-04 (×2): 0.9 mg via INTRAVENOUS
  Filled 2022-05-03: qty 30

## 2022-05-03 MED ORDER — IBUPROFEN 600 MG PO TABS
600.0000 mg | ORAL_TABLET | Freq: Four times a day (QID) | ORAL | Status: DC | PRN
  Administered 2022-05-03: 600 mg via ORAL

## 2022-05-03 MED ORDER — NALOXONE HCL 0.4 MG/ML IJ SOLN: 0.4000 mg | INTRAMUSCULAR | Status: DC | PRN

## 2022-05-03 MED ORDER — HYDROMORPHONE HCL 1 MG/ML IJ SOLN
0.2500 mg | INTRAMUSCULAR | Status: DC | PRN
  Administered 2022-05-03 (×4): 0.5 mg via INTRAVENOUS

## 2022-05-03 MED ORDER — FENTANYL CITRATE (PF) 250 MCG/5ML IJ SOLN
INTRAMUSCULAR | Status: DC | PRN
  Administered 2022-05-03: 50 ug via INTRAVENOUS
  Administered 2022-05-03: 150 ug via INTRAVENOUS
  Administered 2022-05-03: 100 ug via INTRAVENOUS
  Administered 2022-05-03: 50 ug via INTRAVENOUS

## 2022-05-03 MED ORDER — ONDANSETRON HCL 4 MG/2ML IJ SOLN: 4.0000 mg | Freq: Three times a day (TID) | INTRAMUSCULAR | Status: DC | PRN

## 2022-05-03 MED ORDER — SODIUM CHLORIDE 0.9% FLUSH: 9.0000 mL | INTRAVENOUS | Status: DC | PRN

## 2022-05-03 MED ORDER — BUPRENORPHINE HCL-NALOXONE HCL 8-2 MG SL SUBL
1.0000 | SUBLINGUAL_TABLET | Freq: Three times a day (TID) | SUBLINGUAL | Status: DC
  Administered 2022-05-03 – 2022-05-06 (×9): 1 via SUBLINGUAL
  Filled 2022-05-03 (×9): qty 1

## 2022-05-03 MED ORDER — PRENATAL MULTIVITAMIN CH
1.0000 | ORAL_TABLET | Freq: Every day | ORAL | Status: DC
  Administered 2022-05-05 – 2022-05-06 (×2): 1 via ORAL
  Filled 2022-05-03 (×3): qty 1

## 2022-05-03 MED ORDER — ACETAMINOPHEN 500 MG PO TABS
1000.0000 mg | ORAL_TABLET | Freq: Four times a day (QID) | ORAL | Status: AC
  Administered 2022-05-03 – 2022-05-04 (×3): 1000 mg via ORAL
  Filled 2022-05-03 (×3): qty 2

## 2022-05-03 MED ORDER — OXYTOCIN-SODIUM CHLORIDE 30-0.9 UT/500ML-% IV SOLN
INTRAVENOUS | Status: DC | PRN
  Administered 2022-05-03: 300 mL via INTRAVENOUS

## 2022-05-03 MED ORDER — ACETAMINOPHEN 160 MG/5ML PO SOLN: 1000.0000 mg | Freq: Once | ORAL | Status: AC

## 2022-05-03 MED ORDER — FENTANYL CITRATE (PF) 100 MCG/2ML IJ SOLN
INTRAMUSCULAR | Status: DC | PRN
  Administered 2022-05-03: 35 ug via INTRAVENOUS

## 2022-05-03 MED ORDER — ONDANSETRON HCL 4 MG/2ML IJ SOLN
INTRAMUSCULAR | Status: AC
  Filled 2022-05-03: qty 2

## 2022-05-03 MED ORDER — DIPHENHYDRAMINE HCL 12.5 MG/5ML PO ELIX: 12.5000 mg | ORAL_SOLUTION | Freq: Four times a day (QID) | ORAL | Status: DC | PRN

## 2022-05-03 MED ORDER — POVIDONE-IODINE 10 % EX SWAB
2.0000 | Freq: Once | CUTANEOUS | Status: AC
  Administered 2022-05-03: 2 via TOPICAL

## 2022-05-03 MED ORDER — FENTANYL CITRATE (PF) 100 MCG/2ML IJ SOLN
INTRAMUSCULAR | Status: AC
  Filled 2022-05-03: qty 2

## 2022-05-03 MED ORDER — HYDROCODONE-ACETAMINOPHEN 5-325 MG PO TABS
1.0000 | ORAL_TABLET | ORAL | Status: DC | PRN
  Administered 2022-05-04 – 2022-05-05 (×3): 2 via ORAL
  Filled 2022-05-03 (×3): qty 2

## 2022-05-03 MED ORDER — SOD CITRATE-CITRIC ACID 500-334 MG/5ML PO SOLN
ORAL | Status: AC
  Filled 2022-05-03: qty 30

## 2022-05-03 MED ORDER — SCOPOLAMINE 1 MG/3DAYS TD PT72
MEDICATED_PATCH | TRANSDERMAL | Status: DC | PRN
  Administered 2022-05-03: 1 via TRANSDERMAL

## 2022-05-03 MED ORDER — SODIUM CHLORIDE 0.9% FLUSH: 3.0000 mL | INTRAVENOUS | Status: DC | PRN

## 2022-05-03 MED ORDER — DROPERIDOL 2.5 MG/ML IJ SOLN: 0.6250 mg | Freq: Once | INTRAMUSCULAR | Status: DC | PRN

## 2022-05-03 MED ORDER — TRANEXAMIC ACID-NACL 1000-0.7 MG/100ML-% IV SOLN
INTRAVENOUS | Status: AC
  Filled 2022-05-03: qty 100

## 2022-05-03 MED ORDER — DEXMEDETOMIDINE HCL IN NACL 80 MCG/20ML IV SOLN
INTRAVENOUS | Status: AC
  Filled 2022-05-03: qty 20

## 2022-05-03 MED ORDER — IBUPROFEN 600 MG PO TABS: 600.0000 mg | ORAL_TABLET | Freq: Once | ORAL | Status: DC

## 2022-05-03 MED ORDER — SENNOSIDES-DOCUSATE SODIUM 8.6-50 MG PO TABS
2.0000 | ORAL_TABLET | ORAL | Status: DC
  Administered 2022-05-04 – 2022-05-06 (×3): 2 via ORAL
  Filled 2022-05-03 (×3): qty 2

## 2022-05-03 MED ORDER — DEXMEDETOMIDINE HCL IN NACL 80 MCG/20ML IV SOLN
INTRAVENOUS | Status: DC | PRN
  Administered 2022-05-03 (×2): 2 mL via BUCCAL

## 2022-05-03 MED ORDER — STERILE WATER FOR IRRIGATION IR SOLN
Status: DC | PRN
  Administered 2022-05-03: 1000 mL

## 2022-05-03 MED ORDER — IBUPROFEN 600 MG PO TABS
ORAL_TABLET | ORAL | Status: AC
  Filled 2022-05-03: qty 1

## 2022-05-03 MED ORDER — SUCCINYLCHOLINE CHLORIDE 200 MG/10ML IV SOSY
PREFILLED_SYRINGE | INTRAVENOUS | Status: DC | PRN
  Administered 2022-05-03: 140 mg via INTRAVENOUS

## 2022-05-03 MED ORDER — DIPHENHYDRAMINE HCL 25 MG PO CAPS: 25.0000 mg | ORAL_CAPSULE | ORAL | Status: DC | PRN

## 2022-05-03 MED ORDER — SCOPOLAMINE 1 MG/3DAYS TD PT72
MEDICATED_PATCH | TRANSDERMAL | Status: AC
  Filled 2022-05-03: qty 1

## 2022-05-03 MED ORDER — NALOXONE HCL 4 MG/10ML IJ SOLN: 1.0000 ug/kg/h | INTRAVENOUS | Status: DC | PRN

## 2022-05-03 MED ORDER — ACETAMINOPHEN 10 MG/ML IV SOLN: 1000.0000 mg | Freq: Once | INTRAVENOUS | Status: DC | PRN

## 2022-05-03 MED ORDER — CEFAZOLIN SODIUM-DEXTROSE 2-4 GM/100ML-% IV SOLN
INTRAVENOUS | Status: AC
  Filled 2022-05-03: qty 100

## 2022-05-03 MED ORDER — SIMETHICONE 80 MG PO CHEW
80.0000 mg | CHEWABLE_TABLET | Freq: Three times a day (TID) | ORAL | Status: DC
  Administered 2022-05-04 – 2022-05-06 (×7): 80 mg via ORAL
  Filled 2022-05-03 (×8): qty 1

## 2022-05-03 MED ORDER — DIPHENHYDRAMINE HCL 50 MG/ML IJ SOLN: 12.5000 mg | Freq: Four times a day (QID) | INTRAMUSCULAR | Status: DC | PRN

## 2022-05-03 MED ORDER — SODIUM CHLORIDE 0.9 % IV SOLN
INTRAVENOUS | Status: AC | PRN
  Administered 2022-05-03: 1000 mL via INTRAMUSCULAR

## 2022-05-03 MED ORDER — ONDANSETRON HCL 4 MG/2ML IJ SOLN: 4.0000 mg | Freq: Four times a day (QID) | INTRAMUSCULAR | Status: DC | PRN

## 2022-05-03 MED ORDER — ROCURONIUM BROMIDE 100 MG/10ML IV SOLN
INTRAVENOUS | Status: DC | PRN
  Administered 2022-05-03: 30 mg via INTRAVENOUS

## 2022-05-03 MED ORDER — PROPOFOL 10 MG/ML IV BOLUS
INTRAVENOUS | Status: DC | PRN
  Administered 2022-05-03: 50 mg via INTRAVENOUS
  Administered 2022-05-03: 200 mg via INTRAVENOUS
  Administered 2022-05-03 (×2): 50 mg via INTRAVENOUS

## 2022-05-03 MED ORDER — ACETAMINOPHEN 10 MG/ML IV SOLN
INTRAVENOUS | Status: AC
  Filled 2022-05-03: qty 100

## 2022-05-03 MED ORDER — IBUPROFEN 600 MG PO TABS
600.0000 mg | ORAL_TABLET | Freq: Four times a day (QID) | ORAL | Status: DC
  Administered 2022-05-03 – 2022-05-05 (×6): 600 mg via ORAL
  Filled 2022-05-03 (×6): qty 1

## 2022-05-03 MED ORDER — ACETAMINOPHEN 500 MG PO TABS
1000.0000 mg | ORAL_TABLET | Freq: Once | ORAL | Status: AC
  Administered 2022-05-03: 1000 mg via ORAL

## 2022-05-03 MED ORDER — MORPHINE SULFATE (PF) 0.5 MG/ML IJ SOLN
INTRAMUSCULAR | Status: AC
  Filled 2022-05-03: qty 10

## 2022-05-03 MED ORDER — PHENYLEPHRINE HCL-NACL 20-0.9 MG/250ML-% IV SOLN: INTRAVENOUS | Status: DC | PRN

## 2022-05-03 MED ORDER — SUGAMMADEX SODIUM 200 MG/2ML IV SOLN
INTRAVENOUS | Status: DC | PRN
  Administered 2022-05-03: 200 mg via INTRAVENOUS

## 2022-05-03 MED ORDER — FENTANYL CITRATE (PF) 250 MCG/5ML IJ SOLN
INTRAMUSCULAR | Status: AC
  Filled 2022-05-03: qty 5

## 2022-05-03 MED ORDER — OXYTOCIN-SODIUM CHLORIDE 30-0.9 UT/500ML-% IV SOLN: 2.5000 [IU]/h | INTRAVENOUS | Status: AC

## 2022-05-03 MED ORDER — ENOXAPARIN SODIUM 60 MG/0.6ML IJ SOSY
60.0000 mg | PREFILLED_SYRINGE | INTRAMUSCULAR | Status: DC
  Administered 2022-05-04 – 2022-05-06 (×3): 60 mg via SUBCUTANEOUS
  Filled 2022-05-03 (×3): qty 0.6

## 2022-05-03 MED ORDER — DIPHENHYDRAMINE HCL 50 MG/ML IJ SOLN: 12.5000 mg | INTRAMUSCULAR | Status: DC | PRN

## 2022-05-03 MED ORDER — COCONUT OIL OIL: 1.0000 | TOPICAL_OIL | Status: DC | PRN

## 2022-05-03 MED ORDER — FENTANYL CITRATE (PF) 100 MCG/2ML IJ SOLN: INTRAMUSCULAR | Status: DC | PRN

## 2022-05-03 MED ORDER — IBUPROFEN 600 MG PO TABS: 600.0000 mg | ORAL_TABLET | Freq: Four times a day (QID) | ORAL | Status: DC

## 2022-05-03 MED ORDER — LIDOCAINE HCL (PF) 1 % IJ SOLN
INTRAMUSCULAR | Status: AC
  Filled 2022-05-03: qty 10

## 2022-05-03 MED ORDER — OXYTOCIN-SODIUM CHLORIDE 30-0.9 UT/500ML-% IV SOLN
INTRAVENOUS | Status: AC
  Filled 2022-05-03: qty 500

## 2022-05-03 MED ORDER — SIMETHICONE 80 MG PO CHEW
80.0000 mg | CHEWABLE_TABLET | ORAL | Status: DC | PRN
  Administered 2022-05-05: 80 mg via ORAL

## 2022-05-03 MED ORDER — TRANEXAMIC ACID-NACL 1000-0.7 MG/100ML-% IV SOLN
1000.0000 mg | INTRAVENOUS | Status: AC
  Administered 2022-05-03: 1000 mg via INTRAVENOUS

## 2022-05-03 MED ORDER — PHENYLEPHRINE HCL-NACL 20-0.9 MG/250ML-% IV SOLN
INTRAVENOUS | Status: AC
  Filled 2022-05-03: qty 250

## 2022-05-03 MED ORDER — FENTANYL CITRATE (PF) 100 MCG/2ML IJ SOLN: 25.0000 ug | INTRAMUSCULAR | Status: DC | PRN

## 2022-05-03 MED ORDER — WITCH HAZEL-GLYCERIN EX PADS: 1.0000 | MEDICATED_PAD | CUTANEOUS | Status: DC | PRN

## 2022-05-03 MED ORDER — BUPIVACAINE HCL (PF) 0.25 % IJ SOLN
INTRAMUSCULAR | Status: AC
  Filled 2022-05-03: qty 20

## 2022-05-03 MED ORDER — SOD CITRATE-CITRIC ACID 500-334 MG/5ML PO SOLN
30.0000 mL | ORAL | Status: AC
  Administered 2022-05-03: 30 mL via ORAL

## 2022-05-03 MED ORDER — ONDANSETRON HCL 4 MG/2ML IJ SOLN
INTRAMUSCULAR | Status: DC | PRN
  Administered 2022-05-03: 4 mg via INTRAVENOUS

## 2022-05-03 MED ORDER — BUPIVACAINE IN DEXTROSE 0.75-8.25 % IT SOLN
INTRATHECAL | Status: AC
  Filled 2022-05-03: qty 2

## 2022-05-03 MED ORDER — KETOROLAC TROMETHAMINE 30 MG/ML IJ SOLN: 30.0000 mg | Freq: Four times a day (QID) | INTRAMUSCULAR | Status: DC

## 2022-05-03 MED ORDER — MENTHOL 3 MG MT LOZG: 1.0000 | LOZENGE | OROMUCOSAL | Status: DC | PRN

## 2022-05-03 MED ORDER — CEFAZOLIN SODIUM-DEXTROSE 2-4 GM/100ML-% IV SOLN
2.0000 g | INTRAVENOUS | Status: AC
  Administered 2022-05-03: 2 g via INTRAVENOUS

## 2022-05-03 MED ORDER — DIPHENHYDRAMINE HCL 25 MG PO CAPS: 25.0000 mg | ORAL_CAPSULE | Freq: Four times a day (QID) | ORAL | Status: DC | PRN

## 2022-05-03 SURGICAL SUPPLY — 40 items
APL SKNCLS STERI-STRIP NONHPOA (GAUZE/BANDAGES/DRESSINGS) ×1
BENZOIN TINCTURE PRP APPL 2/3 (GAUZE/BANDAGES/DRESSINGS) IMPLANT
CHLORAPREP W/TINT 26ML (MISCELLANEOUS) ×2 IMPLANT
CLAMP UMBILICAL CORD (MISCELLANEOUS) ×1 IMPLANT
CLOTH BEACON ORANGE TIMEOUT ST (SAFETY) ×1 IMPLANT
DRESSING PREVENA PLUS CUSTOM (GAUZE/BANDAGES/DRESSINGS) IMPLANT
DRSG OPSITE POSTOP 4X10 (GAUZE/BANDAGES/DRESSINGS) ×1 IMPLANT
DRSG PREVENA PLUS CUSTOM (GAUZE/BANDAGES/DRESSINGS) ×2
ELECT REM PT RETURN 9FT ADLT (ELECTROSURGICAL) ×1
ELECTRODE REM PT RTRN 9FT ADLT (ELECTROSURGICAL) ×1 IMPLANT
EXTRACTOR VACUUM BELL STYLE (SUCTIONS) IMPLANT
GAUZE SPONGE 4X4 12PLY STRL LF (GAUZE/BANDAGES/DRESSINGS) IMPLANT
GLOVE BIOGEL PI IND STRL 7.0 (GLOVE) ×2 IMPLANT
GLOVE ECLIPSE 7.0 STRL STRAW (GLOVE) ×1 IMPLANT
GOWN STRL REUS W/TWL LRG LVL3 (GOWN DISPOSABLE) ×2 IMPLANT
KIT ABG SYR 3ML LUER SLIP (SYRINGE) ×1 IMPLANT
LIGASURE IMPACT 36 18CM CVD LR (INSTRUMENTS) IMPLANT
MAT PREVALON FULL STRYKER (MISCELLANEOUS) IMPLANT
NDL HYPO 25X5/8 SAFETYGLIDE (NEEDLE) ×1 IMPLANT
NEEDLE HYPO 25X5/8 SAFETYGLIDE (NEEDLE) ×1 IMPLANT
NS IRRIG 1000ML POUR BTL (IV SOLUTION) ×1 IMPLANT
PACK C SECTION WH (CUSTOM PROCEDURE TRAY) ×1 IMPLANT
PAD ABD 7.5X8 STRL (GAUZE/BANDAGES/DRESSINGS) IMPLANT
PAD OB MATERNITY 4.3X12.25 (PERSONAL CARE ITEMS) ×1 IMPLANT
RTRCTR C-SECT PINK 25CM LRG (MISCELLANEOUS) ×1 IMPLANT
STRIP CLOSURE SKIN 1/2X4 (GAUZE/BANDAGES/DRESSINGS) IMPLANT
SUT MNCRL 0 VIOLET CTX 36 (SUTURE) ×2 IMPLANT
SUT MONOCRYL 0 CTX 36 (SUTURE) ×2
SUT PLAIN 0 NONE (SUTURE) IMPLANT
SUT PLAIN 2 0 (SUTURE) ×1
SUT PLAIN 2 0 XLH (SUTURE) IMPLANT
SUT PLAIN ABS 2-0 CT1 27XMFL (SUTURE) IMPLANT
SUT VIC AB 0 CTX 36 (SUTURE) ×1
SUT VIC AB 0 CTX36XBRD ANBCTRL (SUTURE) ×1 IMPLANT
SUT VIC AB 2-0 CT1 27 (SUTURE)
SUT VIC AB 2-0 CT1 TAPERPNT 27 (SUTURE) IMPLANT
SUT VIC AB 4-0 KS 27 (SUTURE) ×1 IMPLANT
TOWEL OR 17X24 6PK STRL BLUE (TOWEL DISPOSABLE) ×1 IMPLANT
TRAY FOLEY W/BAG SLVR 14FR LF (SET/KITS/TRAYS/PACK) IMPLANT
WATER STERILE IRR 1000ML POUR (IV SOLUTION) ×1 IMPLANT

## 2022-05-03 NOTE — Anesthesia Postprocedure Evaluation (Signed)
Anesthesia Post Note  Patient: Chloe Rodgers  Procedure(s) Performed: CESAREAN SECTION WITH BILATERAL TUBAL LIGATION     Patient location during evaluation: PACU Anesthesia Type: General Level of consciousness: awake and alert Pain management: pain level controlled Vital Signs Assessment: post-procedure vital signs reviewed and stable Respiratory status: spontaneous breathing, nonlabored ventilation and respiratory function stable Cardiovascular status: blood pressure returned to baseline Postop Assessment: no apparent nausea or vomiting Anesthetic complications: no Comments: C/S under GETA due to inability to place spinal/epidural after multiple attempts.    No notable events documented.  Last Vitals:  Vitals:   05/03/22 1645 05/03/22 1657  BP: 132/85   Pulse: 89   Resp: (!) 21 16  Temp:    SpO2: 98%     Last Pain:  Vitals:   05/03/22 1657  TempSrc:   PainSc: 8    Pain Goal:                Epidural/Spinal Function Cutaneous sensation: Able to Wiggle Toes (05/03/22 1628), Patient able to flex knees: Yes (05/03/22 1628), Patient able to lift hips off bed: No (05/03/22 1628), Back pain beyond tenderness at insertion site: No (05/03/22 1628), Progressively worsening motor and/or sensory loss: No (05/03/22 1628)  Marthenia Rolling

## 2022-05-03 NOTE — Transfer of Care (Signed)
Immediate Anesthesia Transfer of Care Note  Patient: Chloe Rodgers  Procedure(s) Performed: CESAREAN SECTION WITH BILATERAL TUBAL LIGATION  Patient Location: PACU  Anesthesia Type:General  Level of Consciousness: sedated  Airway & Oxygen Therapy: Patient Spontanous Breathing and Patient connected to nasal cannula oxygen  Post-op Assessment: Report given to RN and Post -op Vital signs reviewed and stable  Post vital signs: Reviewed and stable  Last Vitals:  Vitals Value Taken Time  BP 132/73 05/03/22 1506  Temp    Pulse 86 05/03/22 1511  Resp 16 05/03/22 1511  SpO2 100 % 05/03/22 1511  Vitals shown include unvalidated device data.  Last Pain: There were no vitals filed for this visit.       Complications: No notable events documented.

## 2022-05-03 NOTE — Anesthesia Preprocedure Evaluation (Addendum)
Anesthesia Evaluation  Patient identified by MRN, date of birth, ID band Patient awake    Reviewed: Allergy & Precautions, NPO status , Patient's Chart, lab work & pertinent test results  History of Anesthesia Complications (+) PONV and history of anesthetic complications  Airway Mallampati: II  TM Distance: >3 FB Neck ROM: Full    Dental no notable dental hx.    Pulmonary neg pulmonary ROS,    Pulmonary exam normal        Cardiovascular negative cardio ROS Normal cardiovascular exam     Neuro/Psych  Headaches, Seizures -, Well Controlled,  Anxiety Depression    GI/Hepatic negative GI ROS, Neg liver ROS,   Endo/Other  diabetes, Gestational  Renal/GU negative Renal ROS  negative genitourinary   Musculoskeletal negative musculoskeletal ROS (+)   Abdominal   Peds  Hematology  (+) Blood dyscrasia (Hgb 9.8), anemia ,   Anesthesia Other Findings Day of surgery medications reviewed with patient.  Reproductive/Obstetrics (+) Pregnancy (Hx of C/S x3)                           Anesthesia Physical Anesthesia Plan  ASA: 3  Anesthesia Plan: Combined Spinal and Epidural   Post-op Pain Management:    Induction:   PONV Risk Score and Plan: 4 or greater and Treatment may vary due to age or medical condition, Dexamethasone and Ondansetron  Airway Management Planned: Natural Airway  Additional Equipment:   Intra-op Plan:   Post-operative Plan:   Informed Consent: I have reviewed the patients History and Physical, chart, labs and discussed the procedure including the risks, benefits and alternatives for the proposed anesthesia with the patient or authorized representative who has indicated his/her understanding and acceptance.       Plan Discussed with: CRNA  Anesthesia Plan Comments:        Anesthesia Quick Evaluation
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
## 2022-05-03 NOTE — Anesthesia Procedure Notes (Signed)
Procedure Name: Intubation Date/Time: 05/03/2022 1:46 PM  Performed by: Brennan Bailey, MDPre-anesthesia Checklist: Patient identified, Emergency Drugs available, Suction available, Patient being monitored and Timeout performed Patient Re-evaluated:Patient Re-evaluated prior to induction Oxygen Delivery Method: Circle system utilized Preoxygenation: Pre-oxygenation with 100% oxygen Induction Type: IV induction and Rapid sequence Laryngoscope Size: Glidescope and 3 Grade View: Grade I Tube type: Oral Tube size: 7.0 mm Number of attempts: 1 Placement Confirmation: ETT inserted through vocal cords under direct vision, positive ETCO2 and breath sounds checked- equal and bilateral Secured at: 22 cm Tube secured with: Tape Dental Injury: Teeth and Oropharynx as per pre-operative assessment

## 2022-05-03 NOTE — Lactation Note (Signed)
This note was copied from a baby's chart. Lactation Consultation Note  Patient Name: Chloe Rodgers YIRSW'N Date: 05/03/2022 - Age 31 hours old  Reason for consult: Initial assessment;Early term 37-38.6wks;Breastfeeding assistance;Mother's request;Maternal endocrine disorder (baby awake, fussy after blood sugar draw, 1st Blood sugar 64 , LC offered to assist to latch , few sucks , unable to sustain the latch. Baby STS on moms chest .)  Maternal Data Has patient been taught Hand Expression?: Yes (glistening noted on the nipples)  Feeding Mother's Current Feeding Choice: Breast Milk and Formula Nipple Type: Slow - flow  LATCH Score Latch: Repeated attempts needed to sustain latch, nipple held in mouth throughout feeding, stimulation needed to elicit sucking reflex.  Audible Swallowing: None (few sucks , unable to sustain or obtain depth)  Type of Nipple: Everted at rest and after stimulation  Comfort (Breast/Nipple): Soft / non-tender  Hold (Positioning): Full assist, staff holds infant at breast  LATCH Score: 5   Lactation Tools Discussed/Used    Interventions Interventions: Breast feeding basics reviewed;Assisted with latch;Skin to skin;Hand express;Reverse pressure;Support pillows;Education;LC Services brochure  Discharge Pump: DEBP;Personal  Consult Status Consult Status: Follow-up Date: 05/03/22 Follow-up type: In-patient    [place] 05/03/2022, 6:34 PM

## 2022-05-03 NOTE — Op Note (Addendum)
Operative Note   Patient: Chloe Rodgers  Date of Procedure: 05/03/2022  Procedure: Repeat Low Transverse Cesarean and Bilateral Tubal Ligation via Bilateral salpingectomy   Indications: previous uterine incision: low transverse x3 and poorly controlled GDMA2  Pre-operative Diagnosis: Repeat cesarean,Undesired Fertility GESTATIONAL  DIABETES.   Post-operative Diagnosis: Same and Bilateral Tubal Sterilization via Bilateral salpingectomy  TOLAC Candidate: No  Surgeon: Surgeon(s) and Role:    * Sage Kopera, Annice Needy, MD - Primary    * Anyanwu, Sallyanne Havers, MD - Assisting  An experienced assistant was required given the standard of surgical care given the complexity of the case.  This assistant was needed for exposure, dissection, suctioning, retraction, instrument exchange, assisting with delivery with administration of fundal pressure, and for overall help during the procedure.   Anesthesia: general  Anesthesiologist: Brennan Bailey, MD   Antibiotics: Cefazolin   Estimated Blood Loss: 758 ml   Total IV Fluids: 1200 ml  Urine Output:  250 cc OF clear urine  Specimens: bilateral fallopian tubes to pathology   Complications:  injury and ligation of the R epigastric vein    Indications: Chloe Rodgers is a 31 y.o. [id number] with an IUP [redacted]w[redacted]d presenting for scheduled cesarean secondary to the indications listed above.  The risks of cesarean section were discussed with the patient including but were not limited to: bleeding which may require transfusion or reoperation; infection which may require antibiotics; injury to bowel, bladder, ureters or other surrounding organs; injury to the fetus; need for additional procedures including hysterectomy in the event of a life-threatening hemorrhage; placental abnormalities wth subsequent pregnancies, incisional problems, thromboembolic phenomenon and other postoperative/anesthesia complications.  Patient also desires permanent sterilization.  Other  reversible forms of contraception were discussed with patient; she declines all other modalities. Risks of procedure discussed with patient including but not limited to: risk of regret, permanence of method, bleeding, infection, injury to surrounding organs and need for additional procedures.  Failure risk of about 1% with increased risk of ectopic gestation if pregnancy occurs was also discussed with patient.  Also discussed possibility of post-tubal pain syndrome. The patient concurred with the proposed plan, giving informed written consent for the procedures.  Patient has been NPO since last night she will remain NPO for procedure. Anesthesia and OR aware.  Preoperative prophylactic antibiotics and SCDs ordered on call to the OR.   Findings: Viable infant in cephalic presentation, body cord present. Apgars 9, 9. Weight 3118 g . Clear amniotic fluid. Normal placenta, three vessel cord. Normal uterus, Normal bilateral fallopian tubes, Normal bilateral ovaries.  Procedure Details: A Time Out was held and the above information confirmed. The patient received intravenous antibiotics and had sequential compression devices applied to her lower extremities preoperatively. The patient was taken back to the operative suite where general anesthesia was administered after multiple attempts were made at spinal anesthesia. After induction of anesthesia, the patient was draped and prepped in the usual sterile manner and placed in a dorsal supine position with a leftward tilt. A low transverse skin incision was made with scalpel through the old incision line and carried down through the subcutaneous tissue to the fascia. Fascial incision was made and extended transversely. The rectus muscles were separated in the midline bluntly and the peritoneum was entered bluntly. The omentum was partially adherent to the anterior abdominal wall but this was not obstructing the operative field and was easily moved aside. An Alexis  retractor was placed to aid in visualization of the uterus. A bladder  flap was not developed. A low transverse uterine incision was made. The infant was successfully delivered from cephalic presentation, the umbilical cord was clamped after 1 minute. Cord ph was not sent, and cord blood was obtained for evaluation. The placenta was removed Intact and appeared normal. The uterine incision was closed with single running layer of unlocked sutures of 0-Monocryl. Overall, excellent hemostasis was noted.   Attention was then turned to the fallopian tubes. The left fallopian tube was grasped and elevated with babcock clamps, and the Ligasure device was used to remove the tube in it's entirety with excellent hemostasis noted. The same procedure was then carried out on the right fallopian tube with excellent hemostasis noted.  The abdomen and the pelvis were cleared of all clot and debris and the Ubaldo Glassing was removed. Given adherence of the omentum to the anterior abdominal wall and lack of a clearly defined peritoneal layer, a peritoneal closure was not pursued. Prior to closure of the fascia it was noted that there was bleeding coming from beneath the R rectus muscle. On close examination there appeared to be a small injury to the R inferior epigastric vein as well as some adjacent muscle. A single figure of eight stitch was placed with 2-0 plain gut with excellent hemostasis afterwards. The remainder of the abdominal surfaces were inspected and hemostasis was confirmed on all surfaces. The fascia was then closed using 0 Vicryl in a running fashion. The subcutaneous layer was reapproximated with plain gut and the skin was closed with a 4-0 vicryl subcuticular stitch. Due to receiving general anesthesia, the peri-incisional site was injected with a total of 20 mL of bupivacaine. A Prevena wound vac was then applied to the wound. The patient tolerated the procedure well. Sponge, lap, instrument and needle counts were  correct x 2. She was taken to the recovery room in stable condition.  Disposition: PACU - hemodynamically stable.    Signed: Clarnce Flock, MD/MPH Attending Family Medicine Physician, [facility], [place]

## 2022-05-03 NOTE — Discharge Summary (Signed)
Postpartum Discharge Summary      Patient Name: Chloe Rodgers DOB: 09-12-1990 MRN: [id number]  Date of admission: 05/03/2022 Delivery date:05/03/2022  Delivering provider: Clarnce Flock  Date of discharge: 05/06/2022  Admitting diagnosis: Cesarean delivery delivered [O82] History of cesarean section [Z98.891] Intrauterine pregnancy: [redacted]w[redacted]d    Secondary diagnosis:  Principal Problem:   Cesarean delivery delivered Active Problems:   Seizure disorder ([place]   Migraines   Nephrolithiasis   Chronic PID (chronic pelvic inflammatory disease)   Supervision of high risk pregnancy, antepartum   Uncomplicated opioid dependence ([place]   Obesity in pregnancy, antepartum   History of cesarean section   GBS bacteriuria   GDM (gestational diabetes mellitus)   H/O bilateral salpingectomy   Acute blood loss anemia  Additional problems: Anemia    Discharge diagnosis: Term Pregnancy Delivered and GDM A2                                              Post partum procedures:blood transfusion and postpartum tubal ligation Augmentation: N/A Complications: None  Hospital course: Scheduled C/S   31y.o. yo [id number] admitted to the hospital 05/03/2022 for scheduled cesarean section with the following indication: history of 3 prior cesareans and uncontrolled GDM .Delivery details are as follows:  Membrane Rupture Time/Date: 1:55 PM ,05/03/2022   Delivery Method:C-Section, Low Transverse  Details of operation can be found in separate operative note.  Patient had a postpartum course complicated by anemia requiring 1 uPRBCs and Venofer infusion.  She is ambulating, tolerating a regular diet, passing flatus, and urinating well. Patient is discharged home in stable condition on  05/06/22        Newborn Data: Birth date:05/03/2022  Birth time:1:56 PM  Gender:Female  Living status:Living  Apgars:9 9, Weight:3118 g     Magnesium Sulfate received: No BMZ received:  No Rhophylac:N/A MMR:N/A T-DaP: Not given prenatally Flu: Declined prenatally Transfusion:Yes  Physical exam  Vitals:   05/05/22 0400 05/05/22 1500 05/05/22 2040 05/06/22 0630  BP: 111/63 122/63 118/61 98/63  Pulse: 99 88 84 91  Resp: '16 16 16 16  ' Temp: 98.2 F (36.8 C) 98.1 F (36.7 C) 98.3 F (36.8 C) 98.4 F (36.9 C)  TempSrc: Oral Oral Oral Oral  SpO2: 100%  100% 99%  Weight:      Height:       General: alert, cooperative, and no distress Lochia: appropriate Uterine Fundus: firm Incision: Healing well with no significant drainage, No significant erythema, Dressing is clean, dry, and intact DVT Evaluation: No evidence of DVT seen on physical exam. Labs: Lab Results  Component Value Date   WBC 6.5 05/05/2022   HGB 7.7 (L) 05/05/2022   HCT 24.4 (L) 05/05/2022   MCV 78.5 (L) 05/05/2022   PLT 152 05/05/2022      Latest Ref Rng & Units 05/04/2022    7:19 AM  CMP  Glucose 70 - 99 mg/dL 103   BUN 6 - 20 mg/dL <5   Creatinine 0.44 - 1.00 mg/dL 0.33   Sodium 135 - 145 mmol/L 138   Potassium 3.5 - 5.1 mmol/L 3.4   Chloride 98 - 111 mmol/L 106   CO2 22 - 32 mmol/L 21   Calcium 8.9 - 10.3 mg/dL 8.4    Edinburgh Score:    05/06/2022    3:10 AM  Edinburgh Postnatal Depression  Scale Screening Tool  I have been able to laugh and see the funny side of things. 0  I have looked forward with enjoyment to things. 0  I have blamed myself unnecessarily when things went wrong. 0  I have been anxious or worried for no good reason. 0  I have felt scared or panicky for no good reason. 0  Things have been getting on top of me. 0  I have been so unhappy that I have had difficulty sleeping. 0  I have felt sad or miserable. 0  I have been so unhappy that I have been crying. 0  The thought of harming myself has occurred to me. 0  Edinburgh Postnatal Depression Scale Total 0     After visit meds:  Allergies as of 05/06/2022       Reactions   Naproxen Itching   Toradol  [ketorolac Tromethamine] Itching        Medication List     STOP taking these medications    Accu-Chek Guide test strip Generic drug: glucose blood   Accu-Chek Guide w/Device Kit   Accu-Chek Softclix Lancets lancets   aspirin EC 81 MG tablet   Buprenorphine HCl-Naloxone HCl 8-2 MG Film Commonly known as: Suboxone Replaced by: buprenorphine-naloxone 8-2 mg Subl SL tablet   insulin NPH Human 100 UNIT/ML injection Commonly known as: NOVOLIN N   INSULIN SYRINGE .3CC/31GX5/16" 31G X 5/16" 0.3 ML Misc   metFORMIN 1000 MG tablet Commonly known as: GLUCOPHAGE   ondansetron 4 MG disintegrating tablet Commonly known as: ZOFRAN-ODT       TAKE these medications    buprenorphine-naloxone 8-2 mg Subl SL tablet Commonly known as: SUBOXONE Place 1 tablet under the tongue 3 (three) times daily. Replaces: Buprenorphine HCl-Naloxone HCl 8-2 MG Film   capsicum 0.075 % topical cream Commonly known as: ZOSTRIX Apply topically 2 (two) times daily.   ferrous sulfate 325 (65 FE) MG tablet Take 1 tablet (325 mg total) by mouth every other day. Start taking on: May 07, 2022   gabapentin 300 MG capsule Commonly known as: NEURONTIN Take 1 capsule (300 mg total) by mouth 3 (three) times daily.   HYDROcodone-acetaminophen 5-325 MG tablet Commonly known as: NORCO/VICODIN Take 2 tablets by mouth every 4 (four) hours as needed for moderate pain.   ibuprofen 800 MG tablet Commonly known as: ADVIL Take 1 tablet (800 mg total) by mouth 3 (three) times daily.   polyethylene glycol 17 g packet Commonly known as: MIRALAX / GLYCOLAX Take 17 g by mouth daily as needed.   prenatal multivitamin Tabs tablet Take 1 tablet by mouth daily at 12 noon.   senna-docusate 8.6-50 MG tablet Commonly known as: Senokot-S Take 2 tablets by mouth 2 (two) times daily as needed for mild constipation.         Discharge home in stable condition Infant Feeding: Breast Infant Disposition:Mom  rooming in with baby for NAS protocol Discharge instruction: per After Visit Summary and Postpartum booklet. Activity: Advance as tolerated. Pelvic rest for 6 weeks.  Diet: routine diet Future Appointments: Future Appointments  Date Time Provider [place]  05/11/2022 10:20 AM [facility] NURSE [facility]  06/15/2022  8:20 AM [facility]  06/15/2022  9:15 AM Clarnce Flock, MD [facility]   Follow up Visit:   Please schedule this patient for a In person postpartum visit in 4 weeks with the following provider:  Dr. Dione Plover . Additional Postpartum F/U:2 hour GTT and Incision check 1 week  High risk pregnancy complicated by: GDM and opioid dependence on suboxone Delivery mode:  C-Section, Low Transverse  Anticipated Birth Control:  BTL done PP   Wilhemina Cash, MD PGY-1 Family Medicine Resident [place] attestation I have seen and examined this patient and agree with above documentation in the resident's note.   Jessel Gettinger is a 31 y.o. [id number] s/p rLTCS and BTL.   Pain is well controlled.  Plan for birth control is bilateral tubal ligation.  Method of Feeding: breast  PE:  BP 98/63 (BP Location: Left Arm)   Pulse 91   Temp 98.4 F (36.9 C) (Oral)   Resp 16   Ht '5\' 6"'  (1.676 m)   Wt 117 kg   LMP 08/16/2021   SpO2 99%   Breastfeeding Unknown   BMI 41.64 kg/m  Fundus firm  Recent Labs    05/04/22 2118 05/05/22 0836  HGB 7.4* 7.7*  HCT 23.7* 24.4*     Plan: discharge today - postpartum care discussed - f/u clinic in 1wk for incision check, then 6wks for postpartum visit   Myrtis Ser, CNM 9:14 AM 05/06/2022

## 2022-05-04 ENCOUNTER — Encounter: Payer: Self-pay | Admitting: Family Medicine

## 2022-05-04 ENCOUNTER — Encounter (HOSPITAL_COMMUNITY): Payer: Self-pay | Admitting: Family Medicine

## 2022-05-04 LAB — CBC
HCT: 21.6 % — ABNORMAL LOW (ref 36.0–46.0)
HCT: 23.7 % — ABNORMAL LOW (ref 36.0–46.0)
Hemoglobin: 7 g/dL — ABNORMAL LOW (ref 12.0–15.0)
Hemoglobin: 7.4 g/dL — ABNORMAL LOW (ref 12.0–15.0)
MCH: 25 pg — ABNORMAL LOW (ref 26.0–34.0)
MCH: 24.7 pg — ABNORMAL LOW (ref 26.0–34.0)
MCHC: 32.4 g/dL (ref 30.0–36.0)
MCHC: 31.2 g/dL (ref 30.0–36.0)
MCV: 77.1 fL — ABNORMAL LOW (ref 80.0–100.0)
MCV: 79.3 fL — ABNORMAL LOW (ref 80.0–100.0)
Platelets: 115 10*3/uL — ABNORMAL LOW (ref 150–400)
Platelets: 140 10*3/uL — ABNORMAL LOW (ref 150–400)
RBC: 2.8 MIL/uL — ABNORMAL LOW (ref 3.87–5.11)
RBC: 2.99 MIL/uL — ABNORMAL LOW (ref 3.87–5.11)
RDW: 15.3 % (ref 11.5–15.5)
RDW: 15.5 % (ref 11.5–15.5)
WBC: 6.5 10*3/uL (ref 4.0–10.5)
WBC: 6.7 10*3/uL (ref 4.0–10.5)
nRBC: 0 % (ref 0.0–0.2)
nRBC: 0 % (ref 0.0–0.2)

## 2022-05-04 LAB — BASIC METABOLIC PANEL
Anion gap: 11 (ref 5–15)
BUN: <5 mg/dL — ABNORMAL LOW (ref 6–20)
CO2: 21 mmol/L — ABNORMAL LOW (ref 22–32)
Calcium: 8.4 mg/dL — ABNORMAL LOW (ref 8.9–10.3)
Chloride: 106 mmol/L (ref 98–111)
Creatinine, Ser: 0.33 mg/dL — ABNORMAL LOW (ref 0.44–1.00)
GFR, Estimated: >60 mL/min (ref >60)
Glucose, Bld: 103 mg/dL — ABNORMAL HIGH (ref 70–99)
Potassium: 3.4 mmol/L — ABNORMAL LOW (ref 3.5–5.1)
Sodium: 138 mmol/L (ref 135–145)

## 2022-05-04 LAB — GLUCOSE, CAPILLARY: Glucose-Capillary: 97 mg/dL (ref 70–99)

## 2022-05-04 LAB — BPAM RBC
Blood Product Expiration Date: 202311142359
Blood Product Expiration Date: 202311142359
Unit Type and Rh: 6200
Unit Type and Rh: 6200

## 2022-05-04 LAB — TYPE AND SCREEN
ABO/RH(D): A POS
Antibody Screen: NEGATIVE
Unit division: 0
Unit division: 0

## 2022-05-04 LAB — PREPARE RBC (CROSSMATCH)

## 2022-05-04 MED ORDER — SODIUM CHLORIDE 0.9% IV SOLUTION: Freq: Once | INTRAVENOUS | Status: DC

## 2022-05-04 MED ORDER — GABAPENTIN 100 MG PO CAPS
300.0000 mg | ORAL_CAPSULE | Freq: Three times a day (TID) | ORAL | Status: DC
  Administered 2022-05-04 – 2022-05-06 (×8): 300 mg via ORAL
  Filled 2022-05-04 (×9): qty 3

## 2022-05-04 MED ORDER — TETANUS-DIPHTH-ACELL PERTUSSIS 5-2.5-18.5 LF-MCG/0.5 IM SUSY: 0.5000 mL | PREFILLED_SYRINGE | Freq: Once | INTRAMUSCULAR | Status: DC

## 2022-05-04 MED ORDER — HYDROMORPHONE HCL 2 MG PO TABS
2.0000 mg | ORAL_TABLET | ORAL | Status: DC | PRN
  Administered 2022-05-04 (×2): 2 mg via ORAL
  Filled 2022-05-04 (×2): qty 1

## 2022-05-04 MED ORDER — INFLUENZA VAC SPLIT QUAD 0.5 ML IM SUSY: 0.5000 mL | PREFILLED_SYRINGE | INTRAMUSCULAR | Status: DC

## 2022-05-04 NOTE — Social Work (Signed)
CSW acknowledged consult and completed a clinical assessment.  There are no barriers to d/c.  Clinical assessment notes will be entered at a later time.  Aubreyanna Dorrough, LCSWA Clinical Social Worker [phone]  

## 2022-05-05 LAB — CBC WITH DIFFERENTIAL/PLATELET
Abs Immature Granulocytes: 0.03 10*3/uL (ref 0.00–0.07)
Basophils Absolute: 0 10*3/uL (ref 0.0–0.1)
Basophils Relative: 0 %
Eosinophils Absolute: 0.1 10*3/uL (ref 0.0–0.5)
Eosinophils Relative: 1 %
HCT: 24.4 % — ABNORMAL LOW (ref 36.0–46.0)
Hemoglobin: 7.7 g/dL — ABNORMAL LOW (ref 12.0–15.0)
Immature Granulocytes: 1 %
Lymphocytes Relative: 14 %
Lymphs Abs: 0.9 10*3/uL (ref 0.7–4.0)
MCH: 24.8 pg — ABNORMAL LOW (ref 26.0–34.0)
MCHC: 31.6 g/dL (ref 30.0–36.0)
MCV: 78.5 fL — ABNORMAL LOW (ref 80.0–100.0)
Monocytes Absolute: 0.7 10*3/uL (ref 0.1–1.0)
Monocytes Relative: 11 %
Neutro Abs: 4.8 10*3/uL (ref 1.7–7.7)
Neutrophils Relative %: 73 %
Platelets: 152 10*3/uL (ref 150–400)
RBC: 3.11 MIL/uL — ABNORMAL LOW (ref 3.87–5.11)
RDW: 15.6 % — ABNORMAL HIGH (ref 11.5–15.5)
WBC: 6.5 10*3/uL (ref 4.0–10.5)
nRBC: 0 % (ref 0.0–0.2)

## 2022-05-05 LAB — TYPE AND SCREEN
ABO/RH(D): A POS
Antibody Screen: NEGATIVE
Unit division: 0

## 2022-05-05 LAB — BPAM RBC
Blood Product Expiration Date: 202310312359
ISSUE DATE / TIME: 202310241252
Unit Type and Rh: 600

## 2022-05-05 LAB — SURGICAL PATHOLOGY

## 2022-05-05 LAB — BIRTH TISSUE RECOVERY COLLECTION (PLACENTA DONATION)

## 2022-05-05 MED ORDER — CAPSAICIN 0.075 % EX CREA
TOPICAL_CREAM | Freq: Two times a day (BID) | CUTANEOUS | Status: DC
  Filled 2022-05-05 (×2): qty 60

## 2022-05-05 MED ORDER — FERROUS SULFATE 325 (65 FE) MG PO TABS
325.0000 mg | ORAL_TABLET | ORAL | Status: DC
  Administered 2022-05-05: 325 mg via ORAL
  Filled 2022-05-05: qty 1

## 2022-05-05 MED ORDER — HYDROCODONE-ACETAMINOPHEN 5-325 MG PO TABS
2.0000 | ORAL_TABLET | ORAL | Status: DC | PRN
  Administered 2022-05-05 – 2022-05-06 (×8): 2 via ORAL
  Filled 2022-05-05 (×9): qty 2

## 2022-05-05 MED ORDER — OXYCODONE HCL 5 MG PO TABS
10.0000 mg | ORAL_TABLET | Freq: Once | ORAL | Status: AC
  Administered 2022-05-05: 10 mg via ORAL
  Filled 2022-05-05: qty 2

## 2022-05-05 MED ORDER — IBUPROFEN 800 MG PO TABS
800.0000 mg | ORAL_TABLET | Freq: Three times a day (TID) | ORAL | Status: DC
  Administered 2022-05-05 – 2022-05-06 (×5): 800 mg via ORAL
  Filled 2022-05-05 (×5): qty 1

## 2022-05-05 NOTE — Clinical Social Work Maternal (Signed)
CLINICAL SOCIAL WORK MATERNAL/CHILD NOTE  Patient Details  Name: Chloe Rodgers MRN: [id number] Date of Birth: [birth year]  Date:  05/05/2022  Clinical Social Worker Initiating Note:  Letta Kocher, LCSWA Date/Time: Initiated:  05/04/22/1430     Child's Name:  Chloe Rodgers   Biological Parents:  Mother, Father Chloe Rodgers January 18[birth year] 1991, Chloe Rodgers 10/01/1993)   Need for Interpreter:  None   Reason for Referral:  Behavioral Health Concerns   Address:  [address] [place] [address]    Phone number:  [phone] (home)     Additional phone number:   Household Members/Support Persons (HM/SP):   Household Member/Support Person 1, Household Member/Support Person 2, Household Member/Support Person 3, Household Member/Support Person 4   HM/SP Name Relationship DOB or Age  HM/SP -[place] other 10/01/93  HM/SP -2 Kriston Mckinnie son 03/30/17  HM/SP -3 Tonny Bollman daughter 02/25/11  HM/SP -4 Lin Landsman daughter 06/06/12  HM/SP -5        HM/SP -6        HM/SP -7        HM/SP -8          Natural Supports (not living in the home):      Professional Supports: Case Manager/Social Worker Systems analyst)   Employment: Disabled   Type of Work:     Education:  Other (comment) (GED)   Homebound arranged:    Financial Resources:  Medicaid   Other Resources:  Physicist, medical  , [place] Considerations Which May Impact Care:    Strengths:  Ability to meet basic needs  , Compliance with medical plan  , Pediatrician chosen   Psychotropic Medications:         Pediatrician:    [facility] area  Pediatrician List:   Engineer, petroleum Care)  [place]      Pediatrician Fax Number:    Risk Factors/Current Problems:  Mental Health Concerns     Cognitive State:  Able to Concentrate  , Alert     Mood/Affect:  Calm  ,  Comfortable     CSW Assessment: CSW received consult for Drug exposed newborn. CSW met with MOB to complete assessment and offer support. When CSW entered the room, MOB was in bed holding the infant. CSW introduced self, CSW role and reason for visit. MOB was agreeable to visit. Csw inquired about how MOB was feeling, MOB reported she was in some pain nut doing okay. MOB reported she delivery was hard. CSW listened while MOB processed.    CSW inquired about MOB MH hx, MOB reported she was diagnosed with Depression and Bipolar years ago. MOB reported she sees a therapist every other Tuesday at [place]  but no medication relating to her [place]. MOB could not recall her last Bipolar episode or if it was Manic or Depressive.  CSW assessed for safety, MOB denied any SI, HI or DV. MOB identified her children as her supports/ motivation. CSW provided education regarding the baby blues period vs. perinatal mood disorders, discussed treatment and gave resources for mental health follow up if concerns arise.  CSW recommends self-evaluation during the postpartum time period using the New Mom Checklist from Postpartum Progress and encouraged MOB to contact a medical professional if symptoms are noted at any time.  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  CSW inquired about substance use, MOB reported she is prescribed Oxycodone for chronic pain but once she found out she was pregnant was switched Suboxone to manage pain while pregnant. MOB reported she plans to go back on the Oxycodone now that she has delivered. CSW reported she had a CPS case that should now be close MOB reported Deno Etienne is her Water engineer. CSW notified MOB a notification would be made to DSS. MOB voiced understanding.  Notification made to DSS, No barriers to discharge.  CSW provided review of Sudden Infant Death Syndrome (SIDS) precautions. MOB reported she has all necessary items for the infant  including a bassinet and  crib for him to sleep. CSW identifies no further need for intervention and no barriers to discharge at this time.  CSW Plan/Description:  No Further Intervention Required/No Barriers to Discharge, Sudden Infant Death Syndrome (SIDS) Education, Perinatal Mood and Anxiety Disorder (PMADs) Education, [place], CSW Will Continue to Monitor Umbilical Cord Tissue Drug Screen Results and Make Report if Renata Caprice, LCSW 05/05/2022, 11:16 AM

## 2022-05-05 NOTE — Progress Notes (Signed)
POSTPARTUM PROGRESS NOTE  POD #2  Subjective:  Chloe Rodgers is a 31 y.o. [id number] s/p rLTCS with BTL at [redacted]w[redacted]d. No acute events overnight. She reports she is doing well. She denies any problems with ambulating, voiding or po intake. Denies nausea or vomiting. She has passed flatus. Pain is moderately controlled.  Lochia is adequate.  Objective: Blood pressure 111/63, pulse 99, temperature 98.2 F (36.8 C), temperature source Oral, resp. rate 16, height 5\' 6"  (1.676 m), weight 117 kg, last menstrual period 08/16/2021, SpO2 100 %, unknown if currently breastfeeding.  Physical Exam:  General: alert, cooperative and no distress Chest: no respiratory distress Heart: regular rate, distal pulses intact Uterine Fundus: firm, appropriately tender DVT Evaluation: No calf swelling or tenderness Extremities: mild edema Skin: warm, dry; incision clean/dry/intact w/ honeycomb dressing in place  Recent Labs    05/04/22 0622 05/04/22 2118  HGB 7.0* 7.4*  HCT 21.6* 23.7*    Assessment/Plan: Chloe Rodgers is a 31 y.o. Q4B2010 s/p  rLTCS with BTL  at [redacted]w[redacted]d for elective repeat.  POD#2 - Doing welll; pain somewhat controlled. H/H appropriate  Routine postpartum care  OOB, ambulated  Lovenox for VTE prophylaxis  Pain meds now norco 10mg  prn and ibuprofen 800 TID, gabapentin 300 TID Anemia: hgb increased after 1upRBC  Start po ferrous sulfate every other day OUD: continue suboxone TID Contraception: BTL done Feeding: Breast and bottle  Dispo: Plan for discharge later today vs tomorrow.   LOS: 2 days   Shelda Pal, DO OB Fellow  05/05/2022, 8:02 AM

## 2022-05-05 NOTE — Lactation Note (Signed)
This note was copied from a baby's chart. Lactation Consultation Note  Patient Name: Chloe Rodgers Date: 05/05/2022 Reason for consult: Follow-up assessment;Mother's request;Difficult latch;Early term 37-38.6wks;Maternal endocrine disorder  Eat, Sleep and Console (suboxone treatment) Age:31 years  LC in to visit with P4 Mom of ET infant.  Baby is at a 5.2% weight loss with adequate output.  Mom's desire is to breastfeed.  Baby had a difficult latch early on and has been supplemented with formula 22 cal by bottle.   Today, Mom was set up with a DEBP and assisted with first pumping with 24 mm flanges.  Hands free pumping band provided and encouraged doing STS with baby under the band.  Baby showing signs of beginning to withdraw.  Very fussy.  Mom requested help with latch and baby assisted in football hold.  Hand expression done with instruction and drops of colostrum noted.  Baby opening his mouth, several attempts made but baby unable to sustain a latch more than a few sucks.    Talked about supplementing with a paced bottle to calm baby and then try for a latch to the breast.  Baby could not sustain a deep latch after calming with a few sucks of formula.    LC noticed baby gagging and struggling with a extra slow flow (purple) nipple.  Mom reports that baby has been struggling recently.  MD notified.  Educated Mom on pump part cleaning.  Plan recommended- 1- Keep baby STS as much as possible 2-Offer the breast with feeding cues 3- supplement with EBM+/formula per volume guidelines by paced bottle 4-Pump both breasts every 2-3 hrs when baby feeds   Lactation Tools Discussed/Used Tools: Pump;Flanges;Coconut oil;Bottle;Hands-free pumping top Flange Size: 24 Breast pump type: Double-Electric Breast Pump Pump Education: Setup, frequency, and cleaning;Milk Storage Reason for Pumping: Support milk supply/difficult latch/<6lbs Pumping frequency: Encouraged Mom to pump every  2-3 hrs if baby is not latching to the breast  Interventions Interventions: Breast feeding basics reviewed;Skin to skin;Breast massage;Hand express;Coconut oil;DEBP;Pace feeding  Discharge Pump: Hands Free (Motif) WIC Program: Yes  Consult Status Consult Status: Follow-up Date: 05/06/22 Follow-up type: [place] 05/05/2022, 10:00 AM

## 2022-05-06 ENCOUNTER — Other Ambulatory Visit: Payer: Self-pay | Admitting: Family Medicine

## 2022-05-06 ENCOUNTER — Other Ambulatory Visit (HOSPITAL_COMMUNITY): Payer: Self-pay

## 2022-05-06 ENCOUNTER — Encounter (HOSPITAL_COMMUNITY): Payer: Self-pay | Admitting: Family Medicine

## 2022-05-06 DIAGNOSIS — D62 Acute posthemorrhagic anemia: Secondary | ICD-10-CM | POA: Diagnosis not present

## 2022-05-06 MED ORDER — IBUPROFEN 800 MG PO TABS
800.0000 mg | ORAL_TABLET | Freq: Three times a day (TID) | ORAL | 0 refills | Status: DC
  Filled 2022-05-06: qty 30, 10d supply, fill #0

## 2022-05-06 MED ORDER — SENNOSIDES-DOCUSATE SODIUM 8.6-50 MG PO TABS
2.0000 | ORAL_TABLET | Freq: Two times a day (BID) | ORAL | 0 refills | Status: AC | PRN
  Filled 2022-05-06: qty 30, 8d supply, fill #0

## 2022-05-06 MED ORDER — PRENATAL MULTIVITAMIN CH
1.0000 | ORAL_TABLET | Freq: Every day | ORAL | 0 refills | Status: AC
  Filled 2022-05-06: qty 30, 30d supply, fill #0

## 2022-05-06 MED ORDER — GABAPENTIN 300 MG PO CAPS
300.0000 mg | ORAL_CAPSULE | Freq: Three times a day (TID) | ORAL | 0 refills | Status: DC
  Filled 2022-05-06: qty 90, 30d supply, fill #0

## 2022-05-06 MED ORDER — POLYETHYLENE GLYCOL 3350 17 GM/SCOOP PO POWD
17.0000 g | Freq: Every day | ORAL | 0 refills | Status: DC | PRN
  Filled 2022-05-06: qty 238, 14d supply, fill #0

## 2022-05-06 MED ORDER — HYDROCODONE-ACETAMINOPHEN 5-325 MG PO TABS
2.0000 | ORAL_TABLET | ORAL | 0 refills | Status: DC | PRN
  Filled 2022-05-06: qty 30, 3d supply, fill #0

## 2022-05-06 MED ORDER — FERROUS SULFATE 325 (65 FE) MG PO TABS
325.0000 mg | ORAL_TABLET | ORAL | 0 refills | Status: DC
  Filled 2022-05-06: qty 15, 30d supply, fill #0

## 2022-05-06 MED ORDER — SODIUM CHLORIDE 0.9 % IV SOLN
500.0000 mg | Freq: Once | INTRAVENOUS | Status: AC
  Administered 2022-05-06: 500 mg via INTRAVENOUS
  Filled 2022-05-06: qty 500

## 2022-05-06 MED ORDER — BUPRENORPHINE HCL-NALOXONE HCL 8-2 MG SL SUBL
1.0000 | SUBLINGUAL_TABLET | Freq: Three times a day (TID) | SUBLINGUAL | 0 refills | Status: DC
  Filled 2022-05-06: qty 26, 9d supply, fill #0

## 2022-05-06 MED ORDER — CAPSAICIN 0.075 % EX CREA
TOPICAL_CREAM | Freq: Two times a day (BID) | CUTANEOUS | 0 refills | Status: DC
  Filled 2022-05-06: qty 28.3, fill #0

## 2022-05-06 NOTE — Plan of Care (Signed)
Problem: Education: Goal: Knowledge of General Education information will improve Description: Including pain rating scale, medication(s)/side effects and non-pharmacologic comfort measures Outcome: Adequate for Discharge   Problem: Health Behavior/Discharge Planning: Goal: Ability to manage health-related needs will improve Outcome: Adequate for Discharge   Problem: Clinical Measurements: Goal: Ability to maintain clinical measurements within normal limits will improve Outcome: Adequate for Discharge Goal: Will remain free from infection Outcome: Adequate for Discharge Goal: Diagnostic test results will improve Outcome: Adequate for Discharge Goal: Respiratory complications will improve Outcome: Adequate for Discharge Goal: Cardiovascular complication will be avoided Outcome: Adequate for Discharge   Problem: Activity: Goal: Risk for activity intolerance will decrease Outcome: Adequate for Discharge   Problem: Nutrition: Goal: Adequate nutrition will be maintained Outcome: Adequate for Discharge   Problem: Coping: Goal: Level of anxiety will decrease Outcome: Adequate for Discharge   Problem: Elimination: Goal: Will not experience complications related to bowel motility Outcome: Adequate for Discharge Goal: Will not experience complications related to urinary retention Outcome: Adequate for Discharge   Problem: Pain Managment: Goal: General experience of comfort will improve Outcome: Adequate for Discharge   Problem: Safety: Goal: Ability to remain free from injury will improve Outcome: Adequate for Discharge   Problem: Skin Integrity: Goal: Risk for impaired skin integrity will decrease Outcome: Adequate for Discharge   Problem: Education: Goal: Knowledge of General Education information will improve Description: Including pain rating scale, medication(s)/side effects and non-pharmacologic comfort measures Outcome: Adequate for Discharge   Problem: Health  Behavior/Discharge Planning: Goal: Ability to manage health-related needs will improve Outcome: Adequate for Discharge   Problem: Clinical Measurements: Goal: Ability to maintain clinical measurements within normal limits will improve Outcome: Adequate for Discharge Goal: Will remain free from infection Outcome: Adequate for Discharge Goal: Diagnostic test results will improve Outcome: Adequate for Discharge Goal: Respiratory complications will improve Outcome: Adequate for Discharge Goal: Cardiovascular complication will be avoided Outcome: Adequate for Discharge   Problem: Activity: Goal: Risk for activity intolerance will decrease Outcome: Adequate for Discharge   Problem: Nutrition: Goal: Adequate nutrition will be maintained Outcome: Adequate for Discharge   Problem: Coping: Goal: Level of anxiety will decrease Outcome: Adequate for Discharge   Problem: Elimination: Goal: Will not experience complications related to bowel motility Outcome: Adequate for Discharge Goal: Will not experience complications related to urinary retention Outcome: Adequate for Discharge   Problem: Pain Managment: Goal: General experience of comfort will improve Outcome: Adequate for Discharge   Problem: Safety: Goal: Ability to remain free from injury will improve Outcome: Adequate for Discharge   Problem: Skin Integrity: Goal: Risk for impaired skin integrity will decrease Outcome: Adequate for Discharge   Problem: Education: Goal: Knowledge of the prescribed therapeutic regimen will improve Outcome: Adequate for Discharge Goal: Understanding of sexual limitations or changes related to disease process or condition will improve Outcome: Adequate for Discharge Goal: Individualized Educational Video(s) Outcome: Adequate for Discharge   Problem: Self-Concept: Goal: Communication of feelings regarding changes in body function or appearance will improve Outcome: Adequate for Discharge    Problem: Skin Integrity: Goal: Demonstration of wound healing without infection will improve Outcome: Adequate for Discharge   Problem: Education: Goal: Knowledge of condition will improve Outcome: Adequate for Discharge Goal: Individualized Educational Video(s) Outcome: Adequate for Discharge Goal: Individualized Newborn Educational Video(s) Outcome: Adequate for Discharge   Problem: Activity: Goal: Will verbalize the importance of balancing activity with adequate rest periods Outcome: Adequate for Discharge Goal: Ability to tolerate increased activity will improve Outcome: Adequate for Discharge  Problem: Coping: Goal: Ability to identify and utilize available resources and services will improve Outcome: Adequate for Discharge   Problem: Life Cycle: Goal: Chance of risk for complications during the postpartum period will decrease Outcome: Adequate for Discharge   Problem: Role Relationship: Goal: Ability to demonstrate positive interaction with newborn will improve Outcome: Adequate for Discharge   Problem: Skin Integrity: Goal: Demonstration of wound healing without infection will improve Outcome: Adequate for Discharge   Problem: Education: Goal: Knowledge of condition will improve Outcome: Adequate for Discharge Goal: Individualized Educational Video(s) Outcome: Adequate for Discharge Goal: Individualized Newborn Educational Video(s) Outcome: Adequate for Discharge   Problem: Activity: Goal: Will verbalize the importance of balancing activity with adequate rest periods Outcome: Adequate for Discharge Goal: Ability to tolerate increased activity will improve Outcome: Adequate for Discharge   Problem: Coping: Goal: Ability to identify and utilize available resources and services will improve Outcome: Adequate for Discharge   Problem: Life Cycle: Goal: Chance of risk for complications during the postpartum period will decrease Outcome: Adequate for  Discharge   Problem: Role Relationship: Goal: Ability to demonstrate positive interaction with newborn will improve Outcome: Adequate for Discharge   Problem: Skin Integrity: Goal: Demonstration of wound healing without infection will improve Outcome: Adequate for Discharge

## 2022-05-06 NOTE — Lactation Note (Signed)
This note was copied from a baby's chart. Lactation Consultation Note  Patient Name: Boy Teri Legacy GURKY'H Date: 05/06/2022 Reason for consult: Engorgement;Early term 37-38.6wks;Infant weight loss (-8% weight loss)ESC, Birth Parent requesting [place] services. Age:31 hours Infant was given 60 mls of EBM that Birth Parent pumped prior to [facility] entering the room, Birth Parent feels infant is tolerating the White Nfant nipple and Dr. Roosvelt Harps Preemie bottle well. Birth Parent notice infant is content after having her EBM unlike the formula he was jittering and inconsolable.  Birth Parent pumped a total of 120 mls , LC placed 60 mls in fridge Birth Parent was using her own Personal DEBP but missing pieces one side so only pump one breast per session would like to use Hospital Grade DEBP while here and until she can order other tubing for her pump.  Birth Parent  breast was full, LC suggested she pump and discussed engorgement, Per Birth Parent she had engorgement with her youngest son. LC discussed engorgement treatment and prevention, Birth Parent was  still pumping as LC left the room and had expressed 70 mls and Birth Parent has coconut oil she will apply to breast  after she finish using the DEBP.  LC reviewed importance of maternal rest, diet and hydration with Birth Parent.  Birth Parent current feeding plan: 1- she will ask RN/ LC for latch assistance for the next feeding, if infant doesn't latch she will supplement infant pace feeding with her EBM 60+ mls or as much as infant wants. 2- Birth Parent will continue to use DEBP every 3 hours but if she feels fullness sooner she will pump  before 3 hours to prevent engorgement. Maternal Data    Feeding Mother's Current Feeding Choice: Breast Milk Nipple Type: Dr. Clement Husbands  LATCH Score                    Lactation Tools Discussed/Used Tools: Pump Flange Size: 27 Breast pump type: Double-Electric Breast Pump Pump Education:  Setup, frequency, and cleaning;Milk Storage Reason for Pumping: ESC, ETI female infant, weight loss of 8% Pumping frequency: Cotniue to use DEBP evry 3 hours for 15 minutes or longer if still feeling fullness Pumped volume: 70 mL (Still pumping when LC left the room.)  Interventions Interventions: Expressed milk;Coconut oil;DEBP;Pace feeding;Education  Discharge    Consult Status Consult Status: Follow-up Date: 05/07/22 Follow-up type: In-patient    Vicente Serene 05/06/2022, 6:13 PM

## 2022-05-06 NOTE — Progress Notes (Addendum)
POSTPARTUM PROGRESS NOTE  POD #3  Subjective:  Chloe Rodgers is a 31 y.o. [id number] s/p repeat LTCS with BTL at [redacted]w[redacted]d.  She reports she doing well. No acute events overnight. She reports pain is controlled especially with Percocet ibuprofen combo. She denies any problems with ambulating, voiding or po intake. Denies nausea or vomiting. She has passed flatus. Some light headedness when she gets up.  Objective: Blood pressure 98/63, pulse 91, temperature 98.4 F (36.9 C), temperature source Oral, resp. rate 16, height 5\' 6"  (1.676 m), weight 117 kg, last menstrual period 08/16/2021, SpO2 99 %, unknown if currently breastfeeding.  Physical Exam:  General: alert, cooperative and no distress Chest: no respiratory distress Heart:regular rate, distal pulses intact Abdomen: soft, nontender,  Uterine Fundus: firm, appropriately tender DVT Evaluation: No calf swelling or tenderness Extremities: Mild LE edema Skin: warm, dry; incision clean/dry/intact w/honeycomb dressing in place  Recent Labs    05/04/22 2118 05/05/22 0836  HGB 7.4* 7.7*  HCT 23.7* 24.4*    Assessment/Plan: Chloe Rodgers is a 30 y.o. [id number] s/p rLTCS w/BTL at [redacted]w[redacted]d for elective repeat.  POD#3 - Doing welll; pain overall controlled. H/H appropriate  Routine postpartum care  OOB, ambulated  Lovenox for VTE prophylaxis Chronic pain + CS pain management: Norco 10mg  prn and ibuprofen 800 TID, gabapentin 300 TID norco 10mg  prn and ibuprofen 800 TID, gabapentin 300 TID. Capsaicin cream, hot packs for chronic pain areas, ice for incisional pain. May get 1 time 10 mg oxy for breakthrough pain. Anemia: S/p 1uPRB  Will give Venofer infusion before she leaves  Continue po ferrous sulfate BID OUD: Cont. Suboxone TID Contraception: s/p BTL Feeding: Breast/bottle  Dispo: Plan for discharge today. Needs Meds to bed and TOC pharm.   LOS: 3 days   Wilhemina Cash, MD PGY-1 Family Medicine Resident [place] 05/06/2022, 7:31 AM

## 2022-05-07 ENCOUNTER — Ambulatory Visit: Payer: Self-pay

## 2022-05-07 NOTE — Lactation Note (Addendum)
This note was copied from a baby's chart. Lactation Consultation Note  Patient Name: Chloe Rodgers RDEYC'X Date: 05/07/2022 Reason for consult: Mother's request;Engorgement Age:31 days Birth Parent requested [place] services due to engorgement. Birth Parent has not been using DEBP as previously advised, Birth Parent milk is starting to transition  with higher volume but still in Lactogenesis II. LC reinforced to continue to pump every 3 hours  and if breast are feeling full or if feeling discomfort to pump sooner. Birth Parent pumped 105 mls of EBM while LC was in the room. LC observed that Birth Parent gave infant 25 mls of formula at 2108 pm  and 6 mls of formula at 2334 pm. Birth parent had 120 mls of her EBM in fridge when she offered infant  the formula , LC reinforced how Birth Parent's EBM helps infant as he is withdrawing ESC. LC discussed due infant's age he should be consuming 60+ mls per feeding or more if he wants  with pace feeding infant , to help stabilize his weight loss and due being 84 hours he required higher volume of EBM , and tLC suggested Birth Parent to continue to use Dr.Brown's bottle and white Nfant nipple as recommended by SLP. Birth Parent feeding plan: 1- Offer infant EBM that is pumped , 60 + mls per feeding or more if infant wants more. 2- Continue to keep using DEBP every 3 hours or sooner if feeling fullness or discomfort to prevent engorgement. 3- Call [place] if Birth Parent has BF questions or concerns.  Maternal Data    Feeding Nipple Type: Slow - flow  LATCH Score                    Lactation Tools Discussed/Used Pumped volume: 105 mL  Interventions    Discharge    Consult Status Consult Status: Follow-up Date: 05/07/22 Follow-up type: In-patient    Vicente Serene 05/07/2022, 2:19 AM

## 2022-05-11 ENCOUNTER — Encounter: Payer: Self-pay | Admitting: Family Medicine

## 2022-05-11 ENCOUNTER — Ambulatory Visit (INDEPENDENT_AMBULATORY_CARE_PROVIDER_SITE_OTHER): Payer: Medicaid Other

## 2022-05-11 VITALS — BP 126/76 | HR 80

## 2022-05-11 DIAGNOSIS — Z4889 Encounter for other specified surgical aftercare: Secondary | ICD-10-CM

## 2022-05-11 NOTE — Progress Notes (Signed)
Incision Check Visit  Chloe Rodgers is here for incision check following repeat c-section on 05/03/2022.   Assessment: Wound vac was removed. Incision was clean, dry and intact.   Education: Reviewed good wound care and s/s of infection with patient.  Patient will follow up post partum visit .  Darlyne Russian, RN 05/11/2022  10:13 AM

## 2022-05-18 ENCOUNTER — Encounter: Payer: Self-pay | Admitting: Family Medicine

## 2022-05-26 ENCOUNTER — Encounter: Payer: Self-pay | Admitting: Family Medicine

## 2022-05-26 ENCOUNTER — Telehealth: Payer: Self-pay | Admitting: Family Medicine

## 2022-05-26 ENCOUNTER — Other Ambulatory Visit: Payer: Self-pay

## 2022-05-26 NOTE — Telephone Encounter (Signed)
Connect nurse call, patient's edinburgh was 22, and 17 a week ago

## 2022-05-28 ENCOUNTER — Ambulatory Visit: Payer: Medicaid Other

## 2022-06-09 ENCOUNTER — Other Ambulatory Visit: Payer: Self-pay

## 2022-06-09 DIAGNOSIS — O24410 Gestational diabetes mellitus in pregnancy, diet controlled: Secondary | ICD-10-CM

## 2022-06-11 ENCOUNTER — Telehealth: Payer: Self-pay | Admitting: Pediatrics

## 2022-06-15 ENCOUNTER — Other Ambulatory Visit: Payer: Self-pay

## 2022-06-15 ENCOUNTER — Other Ambulatory Visit: Payer: Medicaid Other

## 2022-06-15 ENCOUNTER — Encounter: Payer: Self-pay | Admitting: Family Medicine

## 2022-06-15 ENCOUNTER — Ambulatory Visit (INDEPENDENT_AMBULATORY_CARE_PROVIDER_SITE_OTHER): Payer: Medicaid Other | Admitting: Family Medicine

## 2022-06-15 VITALS — BP 135/85 | HR 79 | Ht 66.0 in | Wt 236.1 lb

## 2022-06-15 DIAGNOSIS — N731 Chronic parametritis and pelvic cellulitis: Secondary | ICD-10-CM

## 2022-06-15 DIAGNOSIS — Z98891 History of uterine scar from previous surgery: Secondary | ICD-10-CM

## 2022-06-15 DIAGNOSIS — R300 Dysuria: Secondary | ICD-10-CM

## 2022-06-15 DIAGNOSIS — N200 Calculus of kidney: Secondary | ICD-10-CM

## 2022-06-15 DIAGNOSIS — G43809 Other migraine, not intractable, without status migrainosus: Secondary | ICD-10-CM

## 2022-06-15 DIAGNOSIS — F1120 Opioid dependence, uncomplicated: Secondary | ICD-10-CM

## 2022-06-15 DIAGNOSIS — Z9079 Acquired absence of other genital organ(s): Secondary | ICD-10-CM

## 2022-06-15 DIAGNOSIS — G40909 Epilepsy, unspecified, not intractable, without status epilepticus: Secondary | ICD-10-CM

## 2022-06-15 DIAGNOSIS — Z8632 Personal history of gestational diabetes: Secondary | ICD-10-CM

## 2022-06-15 LAB — POCT URINALYSIS DIP (DEVICE)
Glucose, UA: NEGATIVE mg/dL
Leukocytes,Ua: NEGATIVE
Nitrite: POSITIVE — AB
Protein, ur: >=300 mg/dL — AB
Specific Gravity, Urine: 1.02 (ref 1.005–1.030)
Urobilinogen, UA: 1 mg/dL (ref 0.0–1.0)
pH: 6.5 (ref 5.0–8.0)

## 2022-06-15 MED ORDER — NITROFURANTOIN MONOHYD MACRO 100 MG PO CAPS: 100.0000 mg | ORAL_CAPSULE | Freq: Two times a day (BID) | ORAL | 0 refills | Status: DC

## 2022-06-15 NOTE — Progress Notes (Signed)
urin   Post Partum Visit Note  Chloe Rodgers is a 31 y.o. [phone] female who presents for a postpartum visit. She is 6 weeks postpartum following a repeat cesarean section.  I have fully reviewed the prenatal and intrapartum course. The delivery was at 37.1 gestational weeks.  Anesthesia: general. Postpartum course has been uneventful. Baby is doing well. Baby is feeding by bottle - Similac Neosure. Bleeding clots , has not had a period since delivery . Bowel function is normal. Bladder function is normal. Patient is sexually active. Contraception method is tubal ligation. Postpartum depression screening: positive. C/o burning/ itching at times with urination.   Upstream - 06/15/22 0934       Pregnancy Intention Screening   Does the patient want to become pregnant in the next year? No    Does the patient's partner want to become pregnant in the next year? No    Would the patient like to discuss contraceptive options today? N/A   had BTL     Contraception Wrap Up   Current Method Female Sterilization    End Method Female Sterilization    Contraception Counseling Provided No    How was the end contraceptive method provided? N/A            The pregnancy intention screening data noted above was reviewed. Potential methods of contraception were discussed. The patient elected to proceed with Female Sterilization.   Edinburgh Postnatal Depression Scale - 06/15/22 0932       Edinburgh Postnatal Depression Scale:  In the Past 7 Days   I have been able to laugh and see the funny side of things. 1    I have looked forward with enjoyment to things. 2    I have blamed myself unnecessarily when things went wrong. 3    I have been anxious or worried for no good reason. 3    I have felt scared or panicky for no good reason. 2    Things have been getting on top of me. 0    I have been so unhappy that I have had difficulty sleeping. 2    I have felt sad or miserable. 1    I have been so  unhappy that I have been crying. 1    The thought of harming myself has occurred to me. 0    Edinburgh Postnatal Depression Scale Total 15             Health Maintenance Due  Topic Date Due   COVID-19 Vaccine (1) Never done   INFLUENZA VACCINE  02/09/2022    The following portions of the patient's history were reviewed and updated as appropriate: allergies, current medications, past family history, past medical history, past social history, past surgical history, and problem list.  Review of Systems Pertinent items noted in HPI and remainder of comprehensive ROS otherwise negative.  Objective:  BP 135/85   Pulse 79   Ht 5\' 6"  (1.676 m)   Wt 236 lb 1.6 oz (107.1 kg)   LMP 08/16/2021   Breastfeeding No Comment: bottle  BMI 38.11 kg/m    General:  alert, cooperative, and appears stated age   Breasts:  not indicated  Lungs: Comfortalbe on room air        Assessment:    There are no diagnoses linked to this encounter.  Normal postpartum exam.   Plan:   Essential components of care per ACOG recommendations:  1.  Mood and well being: Patient with positive depression  screening today. Reviewed local resources for support.  - She is already seeing a counselor and scheduled to see a psychiatrist - Patient tobacco use? No.   - hx of drug use? No.    2. Infant care and feeding:  -Patient currently breastmilk feeding? No.  -Social determinants of health (SDOH) reviewed in EPIC. No concerns  3. Sexuality, contraception and birth spacing - Patient does not want a pregnancy in the next year.  Desired family size is 4 children.  - Reviewed reproductive life planning. Reviewed contraceptive methods based on pt preferences and effectiveness.  Patient had bilateral salpingectomy at time of cesarean.   - Discussed birth spacing of 18 months  4. Sleep and fatigue -Encouraged family/partner/community support of 4 hrs of uninterrupted sleep to help with mood and fatigue  5.  Physical Recovery  - Discussed patients delivery and complications. - Patient had a C-section, no problems at delivery.  - Patient has urinary incontinence? No. - Patient is safe to resume physical and sexual activity  6.  Health Maintenance - HM due items addressed No - up to date - Last pap smear  Diagnosis  Date Value Ref Range Status  11/05/2021   Final   - Negative for intraepithelial lesion or malignancy (NILM)   Pap smear not done at today's visit.  -Breast Cancer screening indicated? No.   7. 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 Still on suboxone 8 mg TID, following w outside provider - PP Bleeding: having clots for past three days, periods prior to this pregnancy was light and was never like this.  - PCP follow up  Return in 1 year (on 06/16/2023) for Annual Wellness Visit.  [facility] for [employer], [facility]

## 2022-06-15 NOTE — Patient Instructions (Signed)
Talk to your doctor about a referral or call [facility] at [phone] for more information.

## 2022-06-17 ENCOUNTER — Other Ambulatory Visit: Payer: Medicaid Other

## 2022-06-17 ENCOUNTER — Encounter: Payer: Self-pay | Admitting: *Deleted

## 2022-06-17 LAB — URINE CULTURE

## 2022-06-26 ENCOUNTER — Other Ambulatory Visit: Payer: Self-pay

## 2022-06-26 ENCOUNTER — Encounter (HOSPITAL_BASED_OUTPATIENT_CLINIC_OR_DEPARTMENT_OTHER): Payer: Self-pay | Admitting: Emergency Medicine

## 2022-06-26 ENCOUNTER — Emergency Department (HOSPITAL_BASED_OUTPATIENT_CLINIC_OR_DEPARTMENT_OTHER): Payer: Medicaid Other

## 2022-06-26 ENCOUNTER — Emergency Department (HOSPITAL_BASED_OUTPATIENT_CLINIC_OR_DEPARTMENT_OTHER)
Admission: EM | Admit: 2022-06-26 | Discharge: 2022-06-26 | Disposition: A | Discharge status: Home / Self Care | Payer: Medicaid Other | Attending: Emergency Medicine | Admitting: Emergency Medicine

## 2022-06-26 DIAGNOSIS — R059 Cough, unspecified: Secondary | ICD-10-CM | POA: Diagnosis present

## 2022-06-26 DIAGNOSIS — R102 Pelvic and perineal pain: Secondary | ICD-10-CM | POA: Diagnosis not present

## 2022-06-26 DIAGNOSIS — J209 Acute bronchitis, unspecified: Secondary | ICD-10-CM | POA: Diagnosis not present

## 2022-06-26 DIAGNOSIS — Z1152 Encounter for screening for COVID-19: Secondary | ICD-10-CM | POA: Insufficient documentation

## 2022-06-26 DIAGNOSIS — J40 Bronchitis, not specified as acute or chronic: Secondary | ICD-10-CM

## 2022-06-26 LAB — RESP PANEL BY RT-PCR (RSV, FLU A&B, COVID)  RVPGX2
Influenza A by PCR: NEGATIVE
Influenza B by PCR: NEGATIVE
Resp Syncytial Virus by PCR: NEGATIVE
SARS Coronavirus 2 by RT PCR: NEGATIVE

## 2022-06-26 LAB — WET PREP, GENITAL
Clue Cells Wet Prep HPF POC: NONE SEEN
Sperm: NONE SEEN
Trich, Wet Prep: NONE SEEN
WBC, Wet Prep HPF POC: <10 (ref <10)
Yeast Wet Prep HPF POC: NONE SEEN

## 2022-06-26 LAB — URINALYSIS, ROUTINE W REFLEX MICROSCOPIC
Bilirubin Urine: NEGATIVE
Glucose, UA: NEGATIVE mg/dL
Hgb urine dipstick: NEGATIVE
Ketones, ur: NEGATIVE mg/dL
Leukocytes,Ua: NEGATIVE
Nitrite: NEGATIVE
Protein, ur: NEGATIVE mg/dL
Specific Gravity, Urine: 1.025 (ref 1.005–1.030)
pH: 6.5 (ref 5.0–8.0)

## 2022-06-26 LAB — PREGNANCY, URINE: Preg Test, Ur: NEGATIVE

## 2022-06-26 MED ORDER — DOXYCYCLINE HYCLATE 100 MG PO CAPS: 100.0000 mg | ORAL_CAPSULE | Freq: Two times a day (BID) | ORAL | 0 refills | Status: DC

## 2022-06-26 MED ORDER — OXYCODONE-ACETAMINOPHEN 5-325 MG PO TABS
2.0000 | ORAL_TABLET | Freq: Once | ORAL | Status: AC
  Administered 2022-06-26: 2 via ORAL
  Filled 2022-06-26: qty 2

## 2022-06-26 NOTE — ED Notes (Signed)
Dc instructions reviewed with patient. Patient voiced understanding. Dc with belongings.  °

## 2022-06-26 NOTE — ED Triage Notes (Signed)
Pt arrives to ED with c/o cough, chills, headache x3 days. She also notes continued pelvic pain x2 weeks. She notes abnormal discharge and odor.

## 2022-06-26 NOTE — Discharge Instructions (Signed)
1.  Continue your usual pain medication as prescribed at home. 2.  Your chest x-ray suggest some bronchitis or pneumonia.  Take doxycycline as prescribed.  Follow-up with your doctor for recheck within the next 3 to 7 days. 3.  Your pregnancy test is negative, your urinalysis does not show signs of infection.  Gonorrhea and Chlamydia test have been done and those results will be available in the next several days.  You may see these on MyChart or follow-up with your doctor to review results.  If they are positive you will be contacted regarding treatment. 4.  Return to the emergency department if you have new worsening or significantly concerning symptoms.

## 2022-06-26 NOTE — ED Provider Notes (Signed)
[facility] EMERGENCY DEPT Provider Note   CSN: [id number] Arrival date & time: 06/26/22  [id number]     History  Chief Complaint  Patient presents with   Pelvic Pain   Cough    Chloe Rodgers is a 31 y.o. female.  HPI Patient but she has 2 concerns today.  She is postpartum by approximately a month and a half from a C-section, [phone].Marland Kitchen  Reports this was her fourth C-section and she had more pain than usual.  She required about a week of hospitalization afterwards.  She has had some ongoing pelvic pain since that time.  She reports she has had some discharge.  She has become sexually active again.  Patient reports she does have history of chronic pelvic pain and chronic PID for which she is treated with oxycodone.  Reports he is also had a cough with productive sputum and nasal congestion for about 2 weeks.  She reports she has had headaches and chills in association with this.  No chest pain or difficulty breathing.  No relief with over-the-counter medications.  Patient is not breast-feeding.    Home Medications Prior to Admission medications   Medication Sig Start Date End Date Taking? Authorizing Provider  doxycycline (VIBRAMYCIN) 100 MG capsule Take 1 capsule (100 mg total) by mouth 2 (two) times daily. 06/26/22  Yes Arby Barrette, MD  buprenorphine-naloxone (SUBOXONE) 8-2 mg SUBL SL tablet Place 1 tablet under the tongue 3 (three) times daily. 05/06/22   Arabella Merles, CNM  doxycycline (VIBRAMYCIN) 100 MG capsule Take 100 mg by mouth 2 (two) times daily. 06/08/22   [provider]  gabapentin (NEURONTIN) 300 MG capsule Take 1 capsule (300 mg total) by mouth 3 (three) times daily. 05/06/22 06/05/22  Arabella Merles, CNM  nitrofurantoin, macrocrystal-monohydrate, (MACROBID) 100 MG capsule Take 1 capsule (100 mg total) by mouth 2 (two) times daily. 06/15/22   Venora Maples, MD  polyethylene glycol powder (GLYCOLAX/MIRALAX) 17 GM/SCOOP powder Take 17  g by mouth daily as needed. Patient not taking: Reported on 05/11/2022 05/06/22   Arabella Merles, CNM      Allergies    Naproxen and Toradol [ketorolac tromethamine]    Review of Systems   Review of Systems  Physical Exam Updated Vital Signs BP 115/89   Pulse 81   Temp 97.8 F (36.6 C)   Resp 16   Ht 5\' 6"  (1.676 m)   Wt 104.3 kg   SpO2 100%   Breastfeeding No   BMI 37.12 kg/m  Physical Exam Constitutional:      Comments: Alert nontoxic.  Clinically well in appearance.  No respiratory distress.  HENT:     Mouth/Throat:     Pharynx: Oropharynx is clear.  Eyes:     Extraocular Movements: Extraocular movements intact.  Cardiovascular:     Rate and Rhythm: Normal rate and regular rhythm.  Pulmonary:     Effort: Pulmonary effort is normal.     Breath sounds: Normal breath sounds.  Abdominal:     Comments: Abdomen soft.  Mild to moderate diffuse discomfort to palpation in the lower abdomen.  No guarding.  No appreciable mass.  C-section incision is completely healed.  There is no erythema drainage or discharge.  Musculoskeletal:        General: Normal range of motion.     Right lower leg: No edema.     Left lower leg: No edema.  Skin:    General: Skin is warm and dry.  Neurological:     General: No focal deficit present.     Mental Status: She is oriented to person, place, and time.     Coordination: Coordination normal.     ED Results / Procedures / Treatments   Labs (all labs ordered are listed, but only abnormal results are displayed) Labs Reviewed  RESP PANEL BY RT-PCR (RSV, FLU A&B, COVID)  [id number]  WET PREP, GENITAL  URINALYSIS, ROUTINE W REFLEX MICROSCOPIC  PREGNANCY, URINE  GC/CHLAMYDIA PROBE AMP ([place]) NOT AT [facility]    EKG None  Radiology DG Chest Port 1 View  Result Date: 06/26/2022 CLINICAL DATA:  Cough, congestion, and fever for 3 days. EXAM: PORTABLE CHEST 1 VIEW COMPARISON:  None Available. FINDINGS: The heart size and mediastinal  contours are within normal limits. Basilar predominant mild reticular pulmonary opacities. No focal consolidation. No pleural effusion or pneumothorax. The visualized upper abdomen is unremarkable. No acute osseous abnormality. IMPRESSION: Basilar predominant reticular pulmonary opacities. Differential considerations include atypical (i.e. viral) infection, edema, or atelectasis. Electronically Signed   By: Jacob Moores M.D.   On: 06/26/2022 12:04    Procedures Procedures    Medications Ordered in ED Medications  oxyCODONE-acetaminophen (PERCOCET/ROXICET) 5-325 MG per tablet 2 tablet (2 tablets Oral Given 06/26/22 1110)    ED Course/ Medical Decision Making/ A&P                           Medical Decision Making Amount and/or Complexity of Data Reviewed Labs: ordered. Radiology: ordered.  Risk Prescription drug management.   Patient presents with 2 complaints today.  Firstly pelvic pain.  Patient does have history of chronic pelvic pain for which she is treated with oxycodone.  She is status post C-section from a month and a half ago.  Patient's abdomen is soft without guarding or distention.  I have low suspicion for complication such as bowel obstruction or other surgical emergency.  She has resumed sexual activity.  Will proceed with urinalysis, pregnancy test and pelvic swab.  (Patient wished to perform self swab and did not wish to have pelvic exam done at this time).  Urinalysis normal, no signs of infection, pregnancy test negative.  Wet prep does not show any trichomoniasis or significant yeast.  At this time regarding pelvic pain, this may be chronic in nature.  Physical exam combined with vital signs and diagnostic tests indicate lower probability of postpartum complications such as endometritis or any retained products of conception.  I do feel patient stable to continue to follow-up with her GYN on outpatient basis for acute on chronic pelvic pain.  GC and chlamydia will be  pending.  Patient however reports she is with the same partner and lower probability for STD.  Regarding cough, headache and chills, agree with COVID and flu testing.  These test have resulted negative.  Patient has clear pulmonary exam to auscultation.  Oxygen saturations are at 100%.  Patient does not exhibit any respiratory distress.  Will proceed with chest x-ray due to 2 weeks of symptoms.  Chest x-ray inter by radiology suggests some basilar atelectasis versus infiltrate.  With 2 weeks of cough and subjective fever we will proceed with a course of doxycycline for but hospital atypical pneumonia.  Patient reports she is not breast-feeding at this time.  She does not show respiratory distress or have active wheezing.  At this time I do not think she needs other inhalers or [facility] diagnostic imaging such  as CT PE study.  She does not have tachycardia, she is at 100% oxygen saturation, no pleuritic chest pain.  At this time aside from postpartum condition appears low probability for PE.  Patient is discharged in good condition.  Does have oxycodone that is prescribed for chronic pain management of pelvic pain.  I have prescribed a prescription for doxycycline.  Return precautions reviewed.  Follow-up plan in place.        Final Clinical Impression(s) / ED Diagnoses Final diagnoses:  Bronchitis  Pelvic pain in female    Rx / DC Orders ED Discharge Orders          Ordered    doxycycline (VIBRAMYCIN) 100 MG capsule  2 times daily        06/26/22 1359              Arby Barrette, MD 06/26/22 1412

## 2022-06-27 ENCOUNTER — Other Ambulatory Visit: Payer: Self-pay

## 2022-06-27 ENCOUNTER — Encounter (HOSPITAL_COMMUNITY): Payer: Self-pay | Admitting: Emergency Medicine

## 2022-06-27 ENCOUNTER — Emergency Department (HOSPITAL_COMMUNITY)
Admission: EM | Admit: 2022-06-27 | Discharge: 2022-06-27 | Disposition: A | Discharge status: Home / Self Care | Payer: Medicaid Other | Attending: Emergency Medicine | Admitting: Emergency Medicine

## 2022-06-27 DIAGNOSIS — J168 Pneumonia due to other specified infectious organisms: Secondary | ICD-10-CM | POA: Diagnosis not present

## 2022-06-27 DIAGNOSIS — B9689 Other specified bacterial agents as the cause of diseases classified elsewhere: Secondary | ICD-10-CM

## 2022-06-27 DIAGNOSIS — R102 Pelvic and perineal pain: Secondary | ICD-10-CM | POA: Diagnosis present

## 2022-06-27 DIAGNOSIS — J189 Pneumonia, unspecified organism: Secondary | ICD-10-CM

## 2022-06-27 DIAGNOSIS — N760 Acute vaginitis: Secondary | ICD-10-CM | POA: Diagnosis not present

## 2022-06-27 LAB — CBC WITH DIFFERENTIAL/PLATELET
Abs Immature Granulocytes: 0.03 10*3/uL (ref 0.00–0.07)
Basophils Absolute: 0 10*3/uL (ref 0.0–0.1)
Basophils Relative: 0 %
Eosinophils Absolute: 0.1 10*3/uL (ref 0.0–0.5)
Eosinophils Relative: 1 %
HCT: 40.2 % (ref 36.0–46.0)
Hemoglobin: 13 g/dL (ref 12.0–15.0)
Immature Granulocytes: 0 %
Lymphocytes Relative: 16 %
Lymphs Abs: 1.1 10*3/uL (ref 0.7–4.0)
MCH: 25.7 pg — ABNORMAL LOW (ref 26.0–34.0)
MCHC: 32.3 g/dL (ref 30.0–36.0)
MCV: 79.6 fL — ABNORMAL LOW (ref 80.0–100.0)
Monocytes Absolute: 0.6 10*3/uL (ref 0.1–1.0)
Monocytes Relative: 9 %
Neutro Abs: 5.2 10*3/uL (ref 1.7–7.7)
Neutrophils Relative %: 74 %
Platelets: 270 10*3/uL (ref 150–400)
RBC: 5.05 MIL/uL (ref 3.87–5.11)
RDW: 18 % — ABNORMAL HIGH (ref 11.5–15.5)
WBC: 7.1 10*3/uL (ref 4.0–10.5)
nRBC: 0 % (ref 0.0–0.2)

## 2022-06-27 LAB — COMPREHENSIVE METABOLIC PANEL
ALT: 23 U/L (ref 0–44)
AST: 18 U/L (ref 15–41)
Albumin: 4 g/dL (ref 3.5–5.0)
Alkaline Phosphatase: 84 U/L (ref 38–126)
Anion gap: 10 (ref 5–15)
BUN: 7 mg/dL (ref 6–20)
CO2: 22 mmol/L (ref 22–32)
Calcium: 9.3 mg/dL (ref 8.9–10.3)
Chloride: 107 mmol/L (ref 98–111)
Creatinine, Ser: 0.84 mg/dL (ref 0.44–1.00)
GFR, Estimated: >60 mL/min (ref >60)
Glucose, Bld: 100 mg/dL — ABNORMAL HIGH (ref 70–99)
Potassium: 3.7 mmol/L (ref 3.5–5.1)
Sodium: 139 mmol/L (ref 135–145)
Total Bilirubin: 0.7 mg/dL (ref 0.3–1.2)
Total Protein: 8 g/dL (ref 6.5–8.1)

## 2022-06-27 LAB — WET PREP, GENITAL
Sperm: NONE SEEN
Trich, Wet Prep: NONE SEEN
WBC, Wet Prep HPF POC: >=10 — AB (ref <10)
Yeast Wet Prep HPF POC: NONE SEEN

## 2022-06-27 LAB — URINALYSIS, ROUTINE W REFLEX MICROSCOPIC
Bacteria, UA: NONE SEEN
Bilirubin Urine: NEGATIVE
Glucose, UA: NEGATIVE mg/dL
Ketones, ur: NEGATIVE mg/dL
Leukocytes,Ua: NEGATIVE
Nitrite: NEGATIVE
Protein, ur: NEGATIVE mg/dL
Specific Gravity, Urine: 1.02 (ref 1.005–1.030)
pH: 7 (ref 5.0–8.0)

## 2022-06-27 LAB — TROPONIN I (HIGH SENSITIVITY): Troponin I (High Sensitivity): 4 ng/L (ref <18)

## 2022-06-27 LAB — LIPASE, BLOOD: Lipase: 26 U/L (ref 11–51)

## 2022-06-27 MED ORDER — FLUCONAZOLE 200 MG PO TABS: 200.0000 mg | ORAL_TABLET | Freq: Every day | ORAL | 0 refills | Status: DC

## 2022-06-27 MED ORDER — DOXYCYCLINE HYCLATE 100 MG PO CAPS: 100.0000 mg | ORAL_CAPSULE | Freq: Two times a day (BID) | ORAL | 0 refills | Status: AC

## 2022-06-27 MED ORDER — METRONIDAZOLE 500 MG PO TABS: 500.0000 mg | ORAL_TABLET | Freq: Two times a day (BID) | ORAL | 0 refills | Status: AC

## 2022-06-27 MED ORDER — FLUCONAZOLE 200 MG PO TABS: 200.0000 mg | ORAL_TABLET | Freq: Every day | ORAL | 0 refills | Status: AC

## 2022-06-27 MED ORDER — METRONIDAZOLE 500 MG PO TABS
500.0000 mg | ORAL_TABLET | Freq: Once | ORAL | Status: AC
  Administered 2022-06-27: 500 mg via ORAL
  Filled 2022-06-27: qty 1

## 2022-06-27 MED ORDER — SODIUM CHLORIDE 0.9 % IV SOLN
1.0000 g | Freq: Once | INTRAVENOUS | Status: AC
  Administered 2022-06-27: 1 g via INTRAVENOUS
  Filled 2022-06-27: qty 10

## 2022-06-27 MED ORDER — HYDROMORPHONE HCL 1 MG/ML IJ SOLN
1.0000 mg | Freq: Once | INTRAMUSCULAR | Status: AC
  Administered 2022-06-27: 1 mg via INTRAVENOUS
  Filled 2022-06-27: qty 1

## 2022-06-27 NOTE — ED Triage Notes (Signed)
Patient from home w/ c/o chest, back and pelvic pain. Was seen yesterday at [facility] and dx w/ PNA.

## 2022-06-27 NOTE — ED Provider Notes (Signed)
Ms. Graver is a patient of attending physician, Dr. Renaye Rakers who requested female provider for pelvic exam.  In summation 31 year old chronic PID followed by [facility], 7 weeks postpartum s/p c-section and BI tubal ligation here for evaluation of pelvic pain and vaginal discharge.  Normal appearing external female genitalia without rashes or lesions, normal vaginal epithelium. Normal appearing cervix with copious thin watery yellow discharge and scant blood at os. No cervical petechiae. Discharge with Odor. Bimanual: Moderate CMT, with BIL adnexal tenderness.  No palpable adnexal masses.. Uterus midline and not fixed. Rectovaginal exam was deferred.  No cystocele or rectocele noted. No pelvic lymphadenopathy noted. Wet prep was obtained.  Cultures for gonorrhea and chlamydia collected. Exam performed with chaperone Steward Drone, RN in room.   Pelvic exam  Date/Time: 06/27/2022 5:02 PM  Performed by: Linwood Dibbles, PA-C Authorized by: Linwood Dibbles, PA-C  Consent: Verbal consent obtained. Written consent not obtained. Risks and benefits: risks, benefits and alternatives were discussed Consent given by: patient Patient understanding: patient states understanding of the procedure being performed Patient consent: the patient's understanding of the procedure matches consent given Procedure consent: procedure consent matches procedure scheduled Relevant documents: relevant documents present and verified Test results: test results available and properly labeled Site marked: the operative site was marked Imaging studies: imaging studies available Required items: required blood products, implants, devices, and special equipment available Patient identity confirmed: verbally with patient Time out: Immediately prior to procedure a "time out" was called to verify the correct patient, procedure, equipment, support staff and site/side marked as required. Preparation: Patient was prepped and draped in  the usual sterile fashion. Local anesthesia used: no  Anesthesia: Local anesthesia used: no  Sedation: Patient sedated: no  Patient tolerance: patient tolerated the procedure well with no immediate complications       Zed Wanninger A, PA-C 06/27/22 1702    Terald Sleeper, MD 06/27/22 1945
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
## 2022-06-27 NOTE — ED Provider Triage Note (Signed)
Emergency Medicine Provider Triage Evaluation Note  Chloe Rodgers , a 31 y.o. female  was evaluated in triage.  Pt complains of multiple complaints.  Patient with persistent cough, shortness of breath, chest pain pelvic pain, nasal congestion, myalgias/arthralgias.  Patient seen [facility] emergency department yesterday with overall reassuring workup.  Patient with C-section 419/17/23.  Noted continued pelvic pain since then of which is increased with recent illness.  Patient oxycodone for treatment of chronic pelvic pain.  Denies vaginal discharge/bleeding, urinary symptoms.  Denies fever, chills, night sweats, nausea, vomiting.  Review of Systems  Positive: See abov Negative:   Physical Exam  BP 105/86   Pulse 93   Temp 98.2 F (36.8 C)   Resp 19   SpO2 96%  Gen:   Awake, no distress   Resp:  Normal effort  MSK:   Moves extremities without difficulty  Other:  No obvious murmurs, rubs.  Lungs clear to auscultation bilaterally.  No lower extremity edema noted.  Medical Decision Making  Medically screening exam initiated at 2:25 PM.  Appropriate orders placed.  Chloe Rodgers was informed that the remainder of the evaluation will be completed by another provider, this initial triage assessment does not replace that evaluation, and the importance of remaining in the ED until their evaluation is complete.  Urine pregnancy and respiratory viral panel negative yesterday.   Peter Garter, [place] 06/27/22 1431

## 2022-06-27 NOTE — Discharge Instructions (Addendum)
You were given additional antibiotics in the ER today to treat for possible pneumonia or pelvic infection.  Your chlamydia and gonorrhea test will result in a few days.  Your current antibiotics will treat both pelvic infection as well as pneumonia.  But it is extremely important that you call your OB/GYN to discuss your symptoms.  Because you still recently delivered a baby, you need to be examined again by the specialist, to make sure you are not having a complication from the delivery.  I have extended your antibiotics with doxycycline from 7 days to 14 days.  After you finish the first 7 days of doxycycline that you already have at home, you will fill and begin taking an additional 7 days with the prescription I sent to your pharmacy.    I prescribed you a second antibiotic called metronidazole, or Flagyl.  This is the treatment for bacterial vaginosis.  You will need to take this for 7 days.  Do not drink any alcohol for the next 7 days while taking this medicine, as it will make you very sick.  I have also prescribed a medicine called fluconazole which you can take if you develop symptoms of a yeast infection.

## 2022-06-28 LAB — GC/CHLAMYDIA PROBE AMP (~~LOC~~) NOT AT ARMC
Chlamydia: NEGATIVE
Chlamydia: NEGATIVE
Comment: NEGATIVE
Comment: NORMAL
Comment: NEGATIVE
Comment: NORMAL
Neisseria Gonorrhea: NEGATIVE
Neisseria Gonorrhea: NEGATIVE

## 2022-07-05 ENCOUNTER — Other Ambulatory Visit: Payer: Self-pay

## 2022-07-05 ENCOUNTER — Encounter (HOSPITAL_COMMUNITY): Payer: Self-pay | Admitting: *Deleted

## 2022-07-05 ENCOUNTER — Emergency Department (HOSPITAL_COMMUNITY)
Admission: EM | Admit: 2022-07-05 | Discharge: 2022-07-05 | Discharge status: Against Medical Advice | Payer: Medicaid Other | Attending: Emergency Medicine | Admitting: Emergency Medicine

## 2022-07-05 DIAGNOSIS — R1110 Vomiting, unspecified: Secondary | ICD-10-CM | POA: Diagnosis not present

## 2022-07-05 DIAGNOSIS — R102 Pelvic and perineal pain: Secondary | ICD-10-CM | POA: Insufficient documentation

## 2022-07-05 LAB — CBC
HCT: 43.2 % (ref 36.0–46.0)
Hemoglobin: 13.8 g/dL (ref 12.0–15.0)
MCH: 25.5 pg — ABNORMAL LOW (ref 26.0–34.0)
MCHC: 31.9 g/dL (ref 30.0–36.0)
MCV: 79.7 fL — ABNORMAL LOW (ref 80.0–100.0)
Platelets: 270 10*3/uL (ref 150–400)
RBC: 5.42 MIL/uL — ABNORMAL HIGH (ref 3.87–5.11)
RDW: 17.8 % — ABNORMAL HIGH (ref 11.5–15.5)
WBC: 7.4 10*3/uL (ref 4.0–10.5)
nRBC: 0 % (ref 0.0–0.2)

## 2022-07-05 LAB — URINALYSIS, ROUTINE W REFLEX MICROSCOPIC
Bilirubin Urine: NEGATIVE
Glucose, UA: NEGATIVE mg/dL
Hgb urine dipstick: NEGATIVE
Ketones, ur: NEGATIVE mg/dL
Nitrite: NEGATIVE
Protein, ur: 30 mg/dL — AB
Specific Gravity, Urine: 1.029 (ref 1.005–1.030)
Squamous Epithelial / HPF: >50 — ABNORMAL HIGH (ref 0–5)
pH: 5 (ref 5.0–8.0)

## 2022-07-05 LAB — COMPREHENSIVE METABOLIC PANEL
ALT: 29 U/L (ref 0–44)
AST: 21 U/L (ref 15–41)
Albumin: 4.3 g/dL (ref 3.5–5.0)
Alkaline Phosphatase: 78 U/L (ref 38–126)
Anion gap: 11 (ref 5–15)
BUN: 14 mg/dL (ref 6–20)
CO2: 17 mmol/L — ABNORMAL LOW (ref 22–32)
Calcium: 9.6 mg/dL (ref 8.9–10.3)
Chloride: 108 mmol/L (ref 98–111)
Creatinine, Ser: 0.95 mg/dL (ref 0.44–1.00)
GFR, Estimated: >60 mL/min (ref >60)
Glucose, Bld: 121 mg/dL — ABNORMAL HIGH (ref 70–99)
Potassium: 3.4 mmol/L — ABNORMAL LOW (ref 3.5–5.1)
Sodium: 136 mmol/L (ref 135–145)
Total Bilirubin: 0.6 mg/dL (ref 0.3–1.2)
Total Protein: 8.3 g/dL — ABNORMAL HIGH (ref 6.5–8.1)

## 2022-07-05 LAB — I-STAT BETA HCG BLOOD, ED (MC, WL, AP ONLY): I-stat hCG, quantitative: <5 m[IU]/mL (ref <5)

## 2022-07-05 LAB — LIPASE, BLOOD: Lipase: 27 U/L (ref 11–51)

## 2022-07-05 MED ORDER — MORPHINE SULFATE (PF) 4 MG/ML IV SOLN
4.0000 mg | Freq: Once | INTRAVENOUS | Status: AC
  Administered 2022-07-05: 4 mg via INTRAVENOUS
  Filled 2022-07-05: qty 1

## 2022-07-05 MED ORDER — POTASSIUM CHLORIDE CRYS ER 20 MEQ PO TBCR
40.0000 meq | EXTENDED_RELEASE_TABLET | Freq: Once | ORAL | Status: AC
  Administered 2022-07-05: 40 meq via ORAL
  Filled 2022-07-05: qty 2

## 2022-07-05 MED ORDER — METOCLOPRAMIDE HCL 5 MG/ML IJ SOLN
10.0000 mg | Freq: Once | INTRAMUSCULAR | Status: AC
  Administered 2022-07-05: 10 mg via INTRAVENOUS
  Filled 2022-07-05: qty 2

## 2022-07-05 MED ORDER — ONDANSETRON 4 MG PO TBDP
4.0000 mg | ORAL_TABLET | Freq: Once | ORAL | Status: AC | PRN
  Administered 2022-07-05: 4 mg via ORAL
  Filled 2022-07-05: qty 1

## 2022-07-05 MED ORDER — DIPHENHYDRAMINE HCL 25 MG PO CAPS
25.0000 mg | ORAL_CAPSULE | Freq: Once | ORAL | Status: AC
  Administered 2022-07-05: 25 mg via ORAL
  Filled 2022-07-05: qty 1

## 2022-07-05 MED ORDER — LACTATED RINGERS IV BOLUS
2000.0000 mL | Freq: Once | INTRAVENOUS | Status: AC
  Administered 2022-07-05: 2000 mL via INTRAVENOUS

## 2022-07-05 NOTE — ED Triage Notes (Signed)
Pt states seen here one week ago and diagnosed with BV, Bronchitis/PNA. She states bronchitis/PNA fine. She is having lower abdominal pain and pain into right lower back/flank area. Reports bad pelvic pain, which she says she has chronic pelvic pain. Pt states vomiting and watery stools. Denies vaginal bleeding or discharge. Pt is post-partum 1 month and not breastfeeding.

## 2022-07-05 NOTE — ED Provider Notes (Signed)
[facility] EMERGENCY DEPARTMENT Provider Note   CSN: [id number] Arrival date & time: 07/05/22  9562     History  Chief Complaint  Patient presents with   Abdominal Pain    p   Emesis    Chloe Rodgers is a 31 y.o. female.   Abdominal Pain Associated symptoms: vomiting   Emesis Associated symptoms: abdominal pain   Patient is a 31 year old female who had a C-section 2 months ago almost exactly with Dr. Sherran Needs   Patient was seen approximately 8 days ago for continued symptoms of abdominal pain that is lower and vaginal discharge.  She is treated for PID at that time.  She states her symptoms have continued.  She states that she has had fevers as high as 103.   She denies any bowel or bladder incontinence or back pain.  No lightheadedness or dizziness.  She has not seen her OB/GYN but does have an appointment January 6.  Does endorse some urinary frequency and urgency and dysuria.   Does not was nausea and some nonbloody nonbilious emesis    Home Medications Prior to Admission medications   Medication Sig Start Date End Date Taking? Authorizing Provider  buprenorphine-naloxone (SUBOXONE) 8-2 mg SUBL SL tablet Place 1 tablet under the tongue 3 (three) times daily. 05/06/22   Arabella Merles, CNM  doxycycline (VIBRAMYCIN) 100 MG capsule Take 100 mg by mouth 2 (two) times daily. 06/08/22   [provider]  doxycycline (VIBRAMYCIN) 100 MG capsule Take 1 capsule (100 mg total) by mouth 2 (two) times daily. 06/26/22   Arby Barrette, MD  gabapentin (NEURONTIN) 300 MG capsule Take 1 capsule (300 mg total) by mouth 3 (three) times daily. 05/06/22 06/05/22  Arabella Merles, CNM  nitrofurantoin, macrocrystal-monohydrate, (MACROBID) 100 MG capsule Take 1 capsule (100 mg total) by mouth 2 (two) times daily. 06/15/22   Venora Maples, MD  polyethylene glycol powder (GLYCOLAX/MIRALAX) 17 GM/SCOOP powder Take 17 g by mouth daily as needed. Patient not  taking: Reported on 05/11/2022 05/06/22   Arabella Merles, CNM      Allergies    Naproxen and Toradol [ketorolac tromethamine]    Review of Systems   Review of Systems  Gastrointestinal:  Positive for abdominal pain and vomiting.    Physical Exam Updated Vital Signs BP 110/82   Pulse 83   Temp 98.1 F (36.7 C) (Oral)   Resp 19   SpO2 97%   Breastfeeding No  Physical Exam Vitals and nursing note reviewed.  Constitutional:      General: She is not in acute distress. HENT:     Head: Normocephalic and atraumatic.     Nose: Nose normal.  Eyes:     General: No scleral icterus. Cardiovascular:     Rate and Rhythm: Normal rate and regular rhythm.     Pulses: Normal pulses.     Heart sounds: Normal heart sounds.  Pulmonary:     Effort: Pulmonary effort is normal. No respiratory distress.     Breath sounds: No wheezing.  Abdominal:     Palpations: Abdomen is soft.     Tenderness: There is abdominal tenderness in the suprapubic area.     Comments: Obese abdomen, clean dry incision from C-section which is without any erythema.  Some suprapubic tenderness.  No guarding or rebound  Musculoskeletal:     Cervical back: Normal range of motion.     Right lower leg: No edema.     Left lower  leg: No edema.  Skin:    General: Skin is warm and dry.     Capillary Refill: Capillary refill takes less than 2 seconds.  Neurological:     Mental Status: She is alert. Mental status is at baseline.  Psychiatric:        Mood and Affect: Mood normal.        Behavior: Behavior normal.     ED Results / Procedures / Treatments   Labs (all labs ordered are listed, but only abnormal results are displayed) Labs Reviewed  COMPREHENSIVE METABOLIC PANEL - Abnormal; Notable for the following components:      Result Value   Potassium 3.4 (*)    CO2 17 (*)    Glucose, Bld 121 (*)    Total Protein 8.3 (*)    All other components within normal limits  CBC - Abnormal; Notable for the following  components:   RBC 5.42 (*)    MCV 79.7 (*)    MCH 25.5 (*)    RDW 17.8 (*)    All other components within normal limits  URINALYSIS, ROUTINE W REFLEX MICROSCOPIC - Abnormal; Notable for the following components:   Color, Urine AMBER (*)    APPearance CLOUDY (*)    Protein, ur 30 (*)    Leukocytes,Ua SMALL (*)    Bacteria, UA FEW (*)    Squamous Epithelial / LPF >50 (*)    All other components within normal limits  WET PREP, GENITAL  LIPASE, BLOOD  I-STAT BETA HCG BLOOD, ED ([facility], [facility], [facility] ONLY)  GC/CHLAMYDIA PROBE AMP ([place]) NOT AT [facility]    EKG None  Radiology No results found.  Procedures Procedures    Medications Ordered in ED Medications  ondansetron (ZOFRAN-ODT) disintegrating tablet 4 mg (4 mg Oral Given 07/05/22 0622)  lactated ringers bolus 2,000 mL (0 mLs Intravenous Stopped 07/05/22 1023)  potassium chloride SA (KLOR-CON M) CR tablet 40 mEq (40 mEq Oral Given 07/05/22 0931)  morphine (PF) 4 MG/ML injection 4 mg (4 mg Intravenous Given 07/05/22 0930)  metoCLOPramide (REGLAN) injection 10 mg (10 mg Intravenous Given 07/05/22 0929)  diphenhydrAMINE (BENADRYL) capsule 25 mg (25 mg Oral Given 07/05/22 0931)    ED Course/ Medical Decision Making/ A&P Clinical Course as of 07/05/22 1202  Mon Jul 05, 2022  3474 OBGYN is [facility]ct[facility] -  Jan 6   [WF]  0845 Pain since C-Section. VD. Painful discharge.  [CC]  2595 Camelia Phenes - MD accepting MAU [WF]    Clinical Course User Index [CC] Glyn Ade, MD [WF] Gailen Shelter, [place]                           Medical Decision Making Amount and/or Complexity of Data Reviewed Labs: ordered.  Risk Prescription drug management.   This patient presents to the ED for concern of abd pain, this involves a number of treatment options, and is a complaint that carries with it a moderate-to-high risk of complications and morbidity. A differential diagnosis was considered for the patient's symptoms which is discussed below:    The causes of generalized abdominal pain include but are not limited to AAA, mesenteric ischemia, appendicitis, diverticulitis, DKA, gastritis, gastroenteritis, AMI, nephrolithiasis, pancreatitis, peritonitis, adrenal insufficiency,lead poisoning, iron toxicity, intestinal ischemia, constipation, UTI,SBO/LBO, splenic rupture, biliary disease, IBD, IBS, PUD, or hepatitis. Ectopic pregnancy, ovarian torsion, PID.    Co morbidities: Discussed in HPI   Brief History:  Patient is a 31 year old female  who had a C-section 2 months ago almost exactly with Dr. Sherran Needs   Patient was seen approximately 8 days ago for continued symptoms of abdominal pain that is lower and vaginal discharge.  She is treated for PID at that time.  She states her symptoms have continued.  She states that she has had fevers as high as 103.   She denies any bowel or bladder incontinence or back pain.  No lightheadedness or dizziness.  She has not seen her OB/GYN but does have an appointment January 6  Does not was nausea and some nonbloody nonbilious emesis    EMR reviewed including pt PMHx, past surgical history and past visits to ER.   See HPI for more details   Lab Tests:   I ordered and independently interpreted labs. Labs notable for No leukocytosis or anemia, CMP with mild hypokalemia will replete here, bicarb slightly low no anion gap suspect some degree of dehydration.  Urinalysis with few bacteria small leukocytes also significant squamous epithelial contamination.  She did endorse urinary symptoms  Imaging Studies:  No imaging studies ordered for this patient    Cardiac Monitoring:  NA NA   Medicines ordered:  I ordered medication including LR 2L, benadryl, retglan, morphine, K chlor  for sx and hypoK Reevaluation of the patient after these medicines showed that the patient PT ELOPED BEFORE I COULD SEE HER I have reviewed the patients home medicines and have made adjustments as  needed   Critical Interventions:     Consults/Attending Physician      Reevaluation:    Social Determinants of Health:      Problem List / ED Course:  Patient with now 2 months of lower abdominal pain was treated for PID approximately 1 week ago.  I discussed with OB/GYN Dr. Camelia Phenes who agrees to see patient at [facility]U for pelvic exam and OB/GYN assessment.  I will treat her symptoms here and hydrate and plan to transfer to [facility]U.  I was called to bedside and patient tells me that her child is having an asthma exacerbation and is and route to the hospital with EMS.  I recommend that she stay here for treatment as patient's daughter will most likely end up in the [facility] emergency room.  Patient was not found to have eloped.   Dispostion:   Final Clinical Impression(s) / ED Diagnoses Final diagnoses:  Suprapubic abdominal pain    Rx / DC Orders ED Discharge Orders     None         Gailen Shelter, [place] 07/05/22 1202    Glyn Ade, MD 07/06/22 [phone]

## 2022-07-15 ENCOUNTER — Telehealth: Payer: Self-pay | Admitting: Pediatrics
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
## 2022-08-06 ENCOUNTER — Encounter: Payer: Self-pay | Admitting: Physical Therapy

## 2022-08-06 ENCOUNTER — Other Ambulatory Visit: Payer: Self-pay

## 2022-08-06 ENCOUNTER — Ambulatory Visit: Payer: Medicaid Other | Attending: Nurse Practitioner | Admitting: Physical Therapy

## 2022-08-06 DIAGNOSIS — R252 Cramp and spasm: Secondary | ICD-10-CM | POA: Diagnosis not present

## 2022-08-06 DIAGNOSIS — R102 Pelvic and perineal pain: Secondary | ICD-10-CM | POA: Diagnosis present

## 2022-08-06 DIAGNOSIS — M5459 Other low back pain: Secondary | ICD-10-CM

## 2022-08-30 ENCOUNTER — Encounter: Payer: Self-pay | Admitting: Family Medicine

## 2022-09-17 ENCOUNTER — Ambulatory Visit: Payer: Medicaid Other | Admitting: Physical Therapy

## 2022-09-24 ENCOUNTER — Ambulatory Visit: Payer: Medicaid Other | Admitting: Physical Therapy

## 2022-10-01 ENCOUNTER — Encounter: Payer: Medicaid Other | Admitting: Physical Therapy

## 2022-10-05 ENCOUNTER — Encounter: Payer: Medicaid Other | Admitting: Physical Therapy

## 2022-10-29 ENCOUNTER — Telehealth: Payer: Self-pay | Admitting: Pediatrics

## 2022-11-01 ENCOUNTER — Encounter: Payer: Self-pay | Admitting: Physical Therapy

## 2022-11-01 ENCOUNTER — Ambulatory Visit: Payer: Medicaid Other | Attending: Nurse Practitioner | Admitting: Physical Therapy

## 2022-11-01 DIAGNOSIS — R252 Cramp and spasm: Secondary | ICD-10-CM | POA: Insufficient documentation

## 2022-11-01 DIAGNOSIS — R102 Pelvic and perineal pain: Secondary | ICD-10-CM
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
## 2022-11-18 ENCOUNTER — Encounter: Payer: Medicaid Other | Admitting: Physical Therapy

## 2022-11-23 ENCOUNTER — Encounter: Payer: Medicaid Other | Admitting: Physical Therapy

## 2022-11-25 ENCOUNTER — Encounter: Payer: Medicaid Other | Attending: Nurse Practitioner | Admitting: Physical Therapy

## 2022-11-25 ENCOUNTER — Encounter: Payer: Self-pay | Admitting: Physical Therapy

## 2022-11-25 DIAGNOSIS — M6281 Muscle weakness (generalized): Secondary | ICD-10-CM | POA: Insufficient documentation

## 2022-11-25 DIAGNOSIS — M5459 Other low back pain: Secondary | ICD-10-CM | POA: Insufficient documentation

## 2022-11-25 DIAGNOSIS — R252 Cramp and spasm: Secondary | ICD-10-CM | POA: Insufficient documentation

## 2022-11-25 DIAGNOSIS — R102 Pelvic and perineal pain: Secondary | ICD-10-CM | POA: Diagnosis present
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
## 2022-11-26 ENCOUNTER — Telehealth: Payer: Self-pay | Admitting: Pediatrics

## 2022-11-26 NOTE — Telephone Encounter (Signed)
NAS follow-up consult coomplete (returned mom's call from 5/16).  Mom is in good spirits and reports significant improvement in both her health and infant's health.  She states that she was changed back to pain medication from MOUD due to ongoing pain from c/s and previous injury.  States pain is nw well managed and she is able to work and care for her children comfortably.  Infant has been receiving ongoing care from multiple specialties and was changed to Pepticate feedings with much improvement per mom.  She is concerned about availability of formula once her current supply is gone as well as infant's difficulty getting thickened formula through current nipples that she is using.  I have contacted SLP and Dietician for further recommendations and will follow with mom upon receipt of responses in addition to next NAS follow-up on 621.

## 2022-11-30 ENCOUNTER — Encounter: Payer: Medicaid Other | Admitting: Physical Therapy

## 2022-12-07 ENCOUNTER — Encounter: Payer: Medicaid Other | Admitting: Physical Therapy

## 2022-12-09 ENCOUNTER — Encounter: Payer: Self-pay | Admitting: Physical Therapy

## 2022-12-09 ENCOUNTER — Encounter: Payer: Medicaid Other | Admitting: Physical Therapy

## 2022-12-09 ENCOUNTER — Other Ambulatory Visit: Payer: Medicaid Other

## 2022-12-09 DIAGNOSIS — R102 Pelvic and perineal pain: Secondary | ICD-10-CM

## 2022-12-09 DIAGNOSIS — R252 Cramp and spasm: Secondary | ICD-10-CM | POA: Diagnosis not present

## 2022-12-09 DIAGNOSIS — M5459 Other low back pain: Secondary | ICD-10-CM

## 2022-12-09 DIAGNOSIS — M6281 Muscle weakness (generalized): Secondary | ICD-10-CM

## 2022-12-09 DIAGNOSIS — Z3200 Encounter for pregnancy test, result unknown: Secondary | ICD-10-CM
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
## 2022-12-10 LAB — BETA HCG QUANT (REF LAB): hCG Quant: <1 m[IU]/mL

## 2022-12-13 ENCOUNTER — Encounter (INDEPENDENT_AMBULATORY_CARE_PROVIDER_SITE_OTHER): Payer: Medicaid Other | Admitting: Physician Assistant

## 2022-12-14 ENCOUNTER — Encounter: Payer: Self-pay | Admitting: Physical Therapy

## 2022-12-14 ENCOUNTER — Encounter: Payer: Medicaid Other | Attending: Nurse Practitioner | Admitting: Physical Therapy

## 2022-12-14 DIAGNOSIS — M5459 Other low back pain: Secondary | ICD-10-CM | POA: Insufficient documentation

## 2022-12-14 DIAGNOSIS — R252 Cramp and spasm: Secondary | ICD-10-CM | POA: Diagnosis present

## 2022-12-14 DIAGNOSIS — R102 Pelvic and perineal pain: Secondary | ICD-10-CM | POA: Insufficient documentation

## 2022-12-14 DIAGNOSIS — M6281 Muscle weakness (generalized): Secondary | ICD-10-CM | POA: Diagnosis present
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
## 2022-12-20 ENCOUNTER — Encounter (HOSPITAL_COMMUNITY): Payer: Self-pay

## 2022-12-20 ENCOUNTER — Emergency Department (HOSPITAL_COMMUNITY)
Admission: EM | Admit: 2022-12-20 | Discharge: 2022-12-21 | Disposition: A | Discharge status: Home / Self Care | Payer: Medicaid Other | Attending: Emergency Medicine | Admitting: Emergency Medicine

## 2022-12-20 DIAGNOSIS — B9689 Other specified bacterial agents as the cause of diseases classified elsewhere: Secondary | ICD-10-CM | POA: Insufficient documentation

## 2022-12-20 DIAGNOSIS — N898 Other specified noninflammatory disorders of vagina: Secondary | ICD-10-CM | POA: Diagnosis present

## 2022-12-20 DIAGNOSIS — N760 Acute vaginitis: Secondary | ICD-10-CM | POA: Diagnosis not present

## 2022-12-20 DIAGNOSIS — N730 Acute parametritis and pelvic cellulitis: Secondary | ICD-10-CM | POA: Insufficient documentation

## 2022-12-20 LAB — CBC WITH DIFFERENTIAL/PLATELET
Abs Immature Granulocytes: 0.02 10*3/uL (ref 0.00–0.07)
Basophils Absolute: 0 10*3/uL (ref 0.0–0.1)
Basophils Relative: 0 %
Eosinophils Absolute: 0 10*3/uL (ref 0.0–0.5)
Eosinophils Relative: 1 %
HCT: 38.5 % (ref 36.0–46.0)
Hemoglobin: 11.6 g/dL — ABNORMAL LOW (ref 12.0–15.0)
Immature Granulocytes: 0 %
Lymphocytes Relative: 20 %
Lymphs Abs: 1.3 10*3/uL (ref 0.7–4.0)
MCH: 24.6 pg — ABNORMAL LOW (ref 26.0–34.0)
MCHC: 30.1 g/dL (ref 30.0–36.0)
MCV: 81.6 fL (ref 80.0–100.0)
Monocytes Absolute: 0.7 10*3/uL (ref 0.1–1.0)
Monocytes Relative: 11 %
Neutro Abs: 4.2 10*3/uL (ref 1.7–7.7)
Neutrophils Relative %: 68 %
Platelets: 262 10*3/uL (ref 150–400)
RBC: 4.72 MIL/uL (ref 3.87–5.11)
RDW: 14.6 % (ref 11.5–15.5)
WBC: 6.2 10*3/uL (ref 4.0–10.5)
nRBC: 0 % (ref 0.0–0.2)

## 2022-12-20 LAB — COMPREHENSIVE METABOLIC PANEL
ALT: 21 U/L (ref 0–44)
AST: 17 U/L (ref 15–41)
Albumin: 4.1 g/dL (ref 3.5–5.0)
Alkaline Phosphatase: 68 U/L (ref 38–126)
Anion gap: 10 (ref 5–15)
BUN: 10 mg/dL (ref 6–20)
CO2: 25 mmol/L (ref 22–32)
Calcium: 9.5 mg/dL (ref 8.9–10.3)
Chloride: 104 mmol/L (ref 98–111)
Creatinine, Ser: 0.88 mg/dL (ref 0.44–1.00)
GFR, Estimated: >60 mL/min (ref >60)
Glucose, Bld: 94 mg/dL (ref 70–99)
Potassium: 4.1 mmol/L (ref 3.5–5.1)
Sodium: 139 mmol/L (ref 135–145)
Total Bilirubin: 0.5 mg/dL (ref 0.3–1.2)
Total Protein: 7.5 g/dL (ref 6.5–8.1)

## 2022-12-20 LAB — URINALYSIS, ROUTINE W REFLEX MICROSCOPIC
Bilirubin Urine: NEGATIVE
Glucose, UA: NEGATIVE mg/dL
Ketones, ur: 5 mg/dL — AB
Leukocytes,Ua: NEGATIVE
Nitrite: NEGATIVE
Protein, ur: NEGATIVE mg/dL
Specific Gravity, Urine: 1.024 (ref 1.005–1.030)
pH: 6 (ref 5.0–8.0)

## 2022-12-20 LAB — I-STAT BETA HCG BLOOD, ED (MC, WL, AP ONLY): I-stat hCG, quantitative: <5 m[IU]/mL (ref <5)

## 2022-12-20 LAB — WET PREP, GENITAL
Sperm: NONE SEEN
Trich, Wet Prep: NONE SEEN
WBC, Wet Prep HPF POC: <10 (ref <10)
Yeast Wet Prep HPF POC: NONE SEEN

## 2022-12-20 LAB — LIPASE, BLOOD: Lipase: 21 U/L (ref 11–51)

## 2022-12-20 MED ORDER — MORPHINE SULFATE (PF) 4 MG/ML IV SOLN
4.0000 mg | Freq: Once | INTRAVENOUS | Status: AC
  Administered 2022-12-20: 4 mg via INTRAVENOUS
  Filled 2022-12-20: qty 1

## 2022-12-20 MED ORDER — METRONIDAZOLE 500 MG PO TABS
500.0000 mg | ORAL_TABLET | Freq: Two times a day (BID) | ORAL | 0 refills | Status: DC
  Filled 2022-12-20: qty 14, 7d supply, fill #0

## 2022-12-20 MED ORDER — ONDANSETRON HCL 4 MG PO TABS
4.0000 mg | ORAL_TABLET | Freq: Four times a day (QID) | ORAL | 0 refills | Status: DC
  Filled 2022-12-20: qty 12, 3d supply, fill #0

## 2022-12-20 MED ORDER — CEFTRIAXONE SODIUM 1 G IJ SOLR
1.0000 g | Freq: Once | INTRAMUSCULAR | Status: AC
  Administered 2022-12-20: 1 g via INTRAMUSCULAR
  Filled 2022-12-20: qty 10

## 2022-12-20 MED ORDER — DOXYCYCLINE HYCLATE 100 MG PO CAPS
100.0000 mg | ORAL_CAPSULE | Freq: Two times a day (BID) | ORAL | 0 refills | Status: DC
  Filled 2022-12-20: qty 14, 7d supply, fill #0

## 2022-12-20 MED ORDER — SODIUM CHLORIDE 0.9 % IV BOLUS
1000.0000 mL | Freq: Once | INTRAVENOUS | Status: AC
  Administered 2022-12-20: 1000 mL via INTRAVENOUS

## 2022-12-20 MED ORDER — ONDANSETRON HCL 4 MG/2ML IJ SOLN
4.0000 mg | Freq: Once | INTRAMUSCULAR | Status: AC
  Administered 2022-12-20: 4 mg via INTRAVENOUS
  Filled 2022-12-20: qty 2

## 2022-12-20 MED ORDER — LIDOCAINE HCL (PF) 1 % IJ SOLN
INTRAMUSCULAR | Status: AC
  Administered 2022-12-20: 2 mL
  Filled 2022-12-20: qty 5

## 2022-12-20 NOTE — Discharge Instructions (Signed)
You have been seen and discharged from the emergency department.  You were found to have a bacterial infection of the vagina.  Further cultures have been sent.  You are also being treated completely for possible GC/committee a and other infections.  Take antibiotics as prescribed.  Take nausea medicine as needed.  It is extremely important that you follow-up with your OB/GYN in the next couple days for reevaluation and further testing as needed.  Abstain from sex until your symptoms are completely resolved, at least for the next week.  Follow-up with your primary provider for further evaluation and further care. Take home medications as prescribed. If you have any worsening symptoms or further concerns for your health please return to an emergency department for further evaluation.

## 2022-12-20 NOTE — ED Triage Notes (Signed)
Pt c/o lower abd pain x several days. Pt states she has a hx of PID, this feels like the same. Pt states she has not been able to void . Pt c/o chills.

## 2022-12-20 NOTE — ED Provider Triage Note (Signed)
Emergency Medicine Provider Triage Evaluation Note  Chloe Rodgers , a 32 y.o. female  was evaluated in triage.  Pt complains of lower abdominal pain, nausea vomiting, fevers, vaginal discharge.  Patient reports that the symptoms began 3 days ago.  She states that she last had unprotected sex 1 month ago.  Patient states that she began having vaginal discharge 3 days ago.  She states that it feels very similar to when she had PID.  She is also endorsing dyspareunia with her sexual encounter 1 month ago.  Denies any sexual encounters since then.  Endorsing lower abdominal pain.  Also unable to urinate for the last 2 days, denies lower back pain.  Review of Systems  Positive:  Negative:   Physical Exam  BP (!) 156/99 (BP Location: Right Arm)   Pulse 84   Temp 98 F (36.7 C) (Oral)   Resp 16   Ht 5\' 6"  (1.676 m)   Wt 116.6 kg   LMP 12/15/2022   SpO2 100%   BMI 41.48 kg/m  Gen:   Awake, no distress   Resp:  Normal effort  MSK:   Moves extremities without difficulty  Other:    Medical Decision Making  Medically screening exam initiated at 3:32 PM.  Appropriate orders placed.  Krystal Teachey was informed that the remainder of the evaluation will be completed by another provider, this initial triage assessment does not replace that evaluation, and the importance of remaining in the ED until their evaluation is complete.     Al Decant, PA-C 12/20/22 1533

## 2022-12-20 NOTE — ED Provider Notes (Signed)
[place] EMERGENCY DEPARTMENT AT [facility] Provider Note   CSN: [id number] Arrival date & time: 12/20/22  1438     History  Chief Complaint  Patient presents with   Abdominal Pain    Chloe Rodgers is a 32 y.o. female.  HPI   32 year old female presents emergency department with concern for dysuria, lower abdominal pain and increased vaginal discharge.  Patient admits to having unprotected sex.  She has had bacterial infections before as well as PID.  She is concerned that this is happening again.  Patient is status post C-section and tubal ligation with bilateral salpingectomy (per op note 05/03/22).  Home Medications Prior to Admission medications   Medication Sig Start Date End Date Taking? Authorizing Provider  doxycycline (VIBRAMYCIN) 100 MG capsule Take 1 capsule (100 mg total) by mouth 2 (two) times daily. 12/20/22  Yes Byrdie Miyazaki, Clabe Seal, DO  metroNIDAZOLE (FLAGYL) 500 MG tablet Take 1 tablet (500 mg total) by mouth 2 (two) times daily. 12/20/22  Yes Veronique Warga, Clabe Seal, DO  ondansetron (ZOFRAN) 4 MG tablet Take 1 tablet (4 mg total) by mouth every 6 (six) hours. 12/20/22  Yes Imran Nuon M, DO  buprenorphine-naloxone (SUBOXONE) 8-2 mg SUBL SL tablet Place 1 tablet under the tongue 3 (three) times daily. 05/06/22   Arabella Merles, CNM  gabapentin (NEURONTIN) 300 MG capsule Take 1 capsule (300 mg total) by mouth 3 (three) times daily. 05/06/22 06/05/22  Arabella Merles, CNM  nitrofurantoin, macrocrystal-monohydrate, (MACROBID) 100 MG capsule Take 1 capsule (100 mg total) by mouth 2 (two) times daily. 06/15/22   Venora Maples, MD  polyethylene glycol powder (GLYCOLAX/MIRALAX) 17 GM/SCOOP powder Take 17 g by mouth daily as needed. Patient not taking: Reported on 05/11/2022 05/06/22   Arabella Merles, CNM      Allergies    Naproxen and Toradol [ketorolac tromethamine]    Review of Systems   Review of Systems  Physical Exam Updated Vital Signs BP (!)  154/107 (BP Location: Right Arm)   Pulse 71   Temp 97.9 F (36.6 C) (Oral)   Resp 18   Ht 5\' 6"  (1.676 m)   Wt 116.6 kg   LMP 12/15/2022   SpO2 100%   BMI 41.48 kg/m  Physical Exam  ED Results / Procedures / Treatments   Labs (all labs ordered are listed, but only abnormal results are displayed) Labs Reviewed  WET PREP, GENITAL - Abnormal; Notable for the following components:      Result Value   Clue Cells Wet Prep HPF POC PRESENT (*)    All other components within normal limits  CBC WITH DIFFERENTIAL/PLATELET - Abnormal; Notable for the following components:   Hemoglobin 11.6 (*)    MCH 24.6 (*)    All other components within normal limits  URINALYSIS, ROUTINE W REFLEX MICROSCOPIC - Abnormal; Notable for the following components:   APPearance HAZY (*)    Hgb urine dipstick SMALL (*)    Ketones, ur 5 (*)    Bacteria, UA RARE (*)    All other components within normal limits  COMPREHENSIVE METABOLIC PANEL  LIPASE, BLOOD  I-STAT BETA HCG BLOOD, ED ([facility], [facility], [facility] ONLY)  GC/CHLAMYDIA PROBE AMP () NOT AT [facility]    EKG None  Radiology No results found.  Procedures Procedures    Medications Ordered in ED Medications  sodium chloride 0.9 % bolus 1,000 mL (1,000 mLs Intravenous New Bag/Given 12/20/22 2222)  morphine (PF) 4 MG/ML injection 4  mg (4 mg Intravenous Given 12/20/22 2222)  morphine (PF) 4 MG/ML injection 4 mg (4 mg Intravenous Given 12/20/22 2340)  ondansetron (ZOFRAN) injection 4 mg (4 mg Intravenous Given 12/20/22 2342)  cefTRIAXone (ROCEPHIN) injection 1 g (1 g Intramuscular Given 12/20/22 2342)  lidocaine (PF) (XYLOCAINE) 1 % injection (2 mLs  Given 12/20/22 2342)    ED Course/ Medical Decision Making/ A&P                             Medical Decision Making Amount and/or Complexity of Data Reviewed Labs: ordered.  Risk Prescription drug management.   32 year old female presents emergency department vaginal discharge, lower abdominal pain,  nausea/vomiting.  Vitals are normal and stable.  Patient has mild tenderness on abdominal palpation but otherwise abdomen seems benign.  Patient declined full pelvic.  Decided to self swab which was significant with clue cells.  This will be sent off for further culture.  Patient also admits concern for other STDs and has had PID in the past.  She is status post bilateral salpingectomy.  No utilization for ultrasound at this time.  I have low suspicion for PID at this time.  She is afebrile, no white count.  After medication feels improved and has been tolerating oral medicine.  Will treat the patient for GC/chlamydia and possible PID but she is otherwise suitable for outpatient follow-up and antibiotics.  Instructed the patient to abstain from sex and follow-up with her OB/GYN in the next couple days for reevaluation of full pelvic.  Patient at this time appears safe and stable for discharge and close outpatient follow up. Discharge plan and strict return to ED precautions discussed, patient verbalizes understanding and agreement.        Final Clinical Impression(s) / ED Diagnoses Final diagnoses:  Bacterial vaginosis    Rx / DC Orders ED Discharge Orders          Ordered    doxycycline (VIBRAMYCIN) 100 MG capsule  2 times daily        12/20/22 2340    metroNIDAZOLE (FLAGYL) 500 MG tablet  2 times daily        12/20/22 2340    ondansetron (ZOFRAN) 4 MG tablet  Every 6 hours        12/20/22 2340              Brinklee Cisse, Clabe Seal, DO 12/21/22 2440

## 2022-12-20 NOTE — ED Provider Notes (Incomplete)
[place] EMERGENCY DEPARTMENT AT [facility] Provider Note   CSN: [id number] Arrival date & time: 12/20/22  1438     History {Add pertinent medical, surgical, social history, OB history to HPI:1} Chief Complaint  Patient presents with  . Abdominal Pain    Chloe Rodgers is a 32 y.o. female.  HPI   32 year old female presents emergency department with concern for dysuria, lower abdominal pain and increased vaginal discharge.  Patient admits to having unprotected sex.  She has had bacterial infections before as well as PID.  She is concerned that this is happening again.  Patient is status post C-section and tubal ligation with bilateral salpingectomy (per op note 05/03/22).  Home Medications Prior to Admission medications   Medication Sig Start Date End Date Taking? Authorizing Provider  doxycycline (VIBRAMYCIN) 100 MG capsule Take 1 capsule (100 mg total) by mouth 2 (two) times daily. 12/20/22  Yes Kimbria Camposano, Clabe Seal, DO  metroNIDAZOLE (FLAGYL) 500 MG tablet Take 1 tablet (500 mg total) by mouth 2 (two) times daily. 12/20/22  Yes Brion Hedges, Clabe Seal, DO  ondansetron (ZOFRAN) 4 MG tablet Take 1 tablet (4 mg total) by mouth every 6 (six) hours. 12/20/22  Yes Farron Lafond M, DO  buprenorphine-naloxone (SUBOXONE) 8-2 mg SUBL SL tablet Place 1 tablet under the tongue 3 (three) times daily. 05/06/22   Arabella Merles, CNM  gabapentin (NEURONTIN) 300 MG capsule Take 1 capsule (300 mg total) by mouth 3 (three) times daily. 05/06/22 06/05/22  Arabella Merles, CNM  nitrofurantoin, macrocrystal-monohydrate, (MACROBID) 100 MG capsule Take 1 capsule (100 mg total) by mouth 2 (two) times daily. 06/15/22   Venora Maples, MD  polyethylene glycol powder (GLYCOLAX/MIRALAX) 17 GM/SCOOP powder Take 17 g by mouth daily as needed. Patient not taking: Reported on 05/11/2022 05/06/22   Arabella Merles, CNM      Allergies    Naproxen and Toradol [ketorolac tromethamine]    Review of  Systems   Review of Systems  Physical Exam Updated Vital Signs BP (!) 154/107 (BP Location: Right Arm)   Pulse 71   Temp 97.9 F (36.6 C) (Oral)   Resp 18   Ht 5\' 6"  (1.676 m)   Wt 116.6 kg   LMP 12/15/2022   SpO2 100%   BMI 41.48 kg/m  Physical Exam  ED Results / Procedures / Treatments   Labs (all labs ordered are listed, but only abnormal results are displayed) Labs Reviewed  WET PREP, GENITAL - Abnormal; Notable for the following components:      Result Value   Clue Cells Wet Prep HPF POC PRESENT (*)    All other components within normal limits  CBC WITH DIFFERENTIAL/PLATELET - Abnormal; Notable for the following components:   Hemoglobin 11.6 (*)    MCH 24.6 (*)    All other components within normal limits  URINALYSIS, ROUTINE W REFLEX MICROSCOPIC - Abnormal; Notable for the following components:   APPearance HAZY (*)    Hgb urine dipstick SMALL (*)    Ketones, ur 5 (*)    Bacteria, UA RARE (*)    All other components within normal limits  COMPREHENSIVE METABOLIC PANEL  LIPASE, BLOOD  I-STAT BETA HCG BLOOD, ED ([facility], [facility], [facility] ONLY)  GC/CHLAMYDIA PROBE AMP ([place]) NOT AT [facility]    EKG None  Radiology No results found.  Procedures Procedures  {Document cardiac monitor, telemetry assessment procedure when appropriate:1}  Medications Ordered in ED Medications  sodium chloride 0.9 % bolus  1,000 mL (1,000 mLs Intravenous New Bag/Given 12/20/22 2222)  morphine (PF) 4 MG/ML injection 4 mg (4 mg Intravenous Given 12/20/22 2222)  morphine (PF) 4 MG/ML injection 4 mg (4 mg Intravenous Given 12/20/22 2340)  ondansetron (ZOFRAN) injection 4 mg (4 mg Intravenous Given 12/20/22 2342)  cefTRIAXone (ROCEPHIN) injection 1 g (1 g Intramuscular Given 12/20/22 2342)  lidocaine (PF) (XYLOCAINE) 1 % injection (2 mLs  Given 12/20/22 2342)    ED Course/ Medical Decision Making/ A&P   {   Click here for ABCD2, HEART and other calculatorsREFRESH Note before signing :1}                           Medical Decision Making Amount and/or Complexity of Data Reviewed Labs: ordered.  Risk Prescription drug management.   ***  {Document critical care time when appropriate:1} {Document review of labs and clinical decision tools ie heart score, Chads2Vasc2 etc:1}  {Document your independent review of radiology images, and any outside records:1} {Document your discussion with family members, caretakers, and with consultants:1} {Document social determinants of health affecting pt's care:1} {Document your decision making why or why not admission, treatments were needed:1} Final Clinical Impression(s) / ED Diagnoses Final diagnoses:  Bacterial vaginosis    Rx / DC Orders ED Discharge Orders          Ordered    doxycycline (VIBRAMYCIN) 100 MG capsule  2 times daily        12/20/22 2340    metroNIDAZOLE (FLAGYL) 500 MG tablet  2 times daily        12/20/22 2340    ondansetron (ZOFRAN) 4 MG tablet  Every 6 hours        12/20/22 2340

## 2022-12-21 ENCOUNTER — Emergency Department (HOSPITAL_BASED_OUTPATIENT_CLINIC_OR_DEPARTMENT_OTHER)
Admission: EM | Admit: 2022-12-21 | Discharge: 2022-12-21 | Disposition: A | Discharge status: Home / Self Care | Payer: Medicaid Other | Source: Home / Self Care | Attending: Emergency Medicine | Admitting: Emergency Medicine

## 2022-12-21 ENCOUNTER — Encounter: Payer: Self-pay | Admitting: Physical Therapy

## 2022-12-21 ENCOUNTER — Other Ambulatory Visit: Payer: Self-pay

## 2022-12-21 ENCOUNTER — Encounter (HOSPITAL_BASED_OUTPATIENT_CLINIC_OR_DEPARTMENT_OTHER): Payer: Self-pay | Admitting: Emergency Medicine

## 2022-12-21 ENCOUNTER — Other Ambulatory Visit (HOSPITAL_BASED_OUTPATIENT_CLINIC_OR_DEPARTMENT_OTHER): Payer: Self-pay

## 2022-12-21 ENCOUNTER — Emergency Department (HOSPITAL_BASED_OUTPATIENT_CLINIC_OR_DEPARTMENT_OTHER): Payer: Medicaid Other

## 2022-12-21 ENCOUNTER — Encounter: Payer: Medicaid Other | Admitting: Physical Therapy

## 2022-12-21 DIAGNOSIS — R252 Cramp and spasm: Secondary | ICD-10-CM | POA: Diagnosis not present

## 2022-12-21 DIAGNOSIS — N730 Acute parametritis and pelvic cellulitis: Secondary | ICD-10-CM

## 2022-12-21 DIAGNOSIS — M6281 Muscle weakness (generalized): Secondary | ICD-10-CM

## 2022-12-21 DIAGNOSIS — M5459 Other low back pain: Secondary | ICD-10-CM

## 2022-12-21 DIAGNOSIS — R102 Pelvic and perineal pain: Secondary | ICD-10-CM

## 2022-12-21 LAB — URINALYSIS, ROUTINE W REFLEX MICROSCOPIC
Bilirubin Urine: NEGATIVE
Glucose, UA: NEGATIVE mg/dL
Hgb urine dipstick: NEGATIVE
Ketones, ur: NEGATIVE mg/dL
Leukocytes,Ua: NEGATIVE
Nitrite: NEGATIVE
Protein, ur: NEGATIVE mg/dL
Specific Gravity, Urine: 1.029 (ref 1.005–1.030)
pH: 6.5 (ref 5.0–8.0)

## 2022-12-21 LAB — CBC WITH DIFFERENTIAL/PLATELET
Abs Immature Granulocytes: 0.01 10*3/uL (ref 0.00–0.07)
Basophils Absolute: 0 10*3/uL (ref 0.0–0.1)
Basophils Relative: 0 %
Eosinophils Absolute: 0.1 10*3/uL (ref 0.0–0.5)
Eosinophils Relative: 1 %
HCT: 34.5 % — ABNORMAL LOW (ref 36.0–46.0)
Hemoglobin: 11 g/dL — ABNORMAL LOW (ref 12.0–15.0)
Immature Granulocytes: 0 %
Lymphocytes Relative: 20 %
Lymphs Abs: 1.2 10*3/uL (ref 0.7–4.0)
MCH: 25.5 pg — ABNORMAL LOW (ref 26.0–34.0)
MCHC: 31.9 g/dL (ref 30.0–36.0)
MCV: 79.9 fL — ABNORMAL LOW (ref 80.0–100.0)
Monocytes Absolute: 1 10*3/uL (ref 0.1–1.0)
Monocytes Relative: 16 %
Neutro Abs: 3.8 10*3/uL (ref 1.7–7.7)
Neutrophils Relative %: 63 %
Platelets: 212 10*3/uL (ref 150–400)
RBC: 4.32 MIL/uL (ref 3.87–5.11)
RDW: 14.6 % (ref 11.5–15.5)
WBC: 6 10*3/uL (ref 4.0–10.5)
nRBC: 0 % (ref 0.0–0.2)

## 2022-12-21 LAB — COMPREHENSIVE METABOLIC PANEL
ALT: 22 U/L (ref 0–44)
AST: 18 U/L (ref 15–41)
Albumin: 4.3 g/dL (ref 3.5–5.0)
Alkaline Phosphatase: 61 U/L (ref 38–126)
Anion gap: 8 (ref 5–15)
BUN: 13 mg/dL (ref 6–20)
CO2: 26 mmol/L (ref 22–32)
Calcium: 9.3 mg/dL (ref 8.9–10.3)
Chloride: 105 mmol/L (ref 98–111)
Creatinine, Ser: 0.81 mg/dL (ref 0.44–1.00)
GFR, Estimated: >60 mL/min (ref >60)
Glucose, Bld: 98 mg/dL (ref 70–99)
Potassium: 3.7 mmol/L (ref 3.5–5.1)
Sodium: 139 mmol/L (ref 135–145)
Total Bilirubin: 0.4 mg/dL (ref 0.3–1.2)
Total Protein: 7.3 g/dL (ref 6.5–8.1)

## 2022-12-21 LAB — GC/CHLAMYDIA PROBE AMP (~~LOC~~) NOT AT ARMC
Chlamydia: NEGATIVE
Comment: NEGATIVE
Comment: NORMAL
Neisseria Gonorrhea: NEGATIVE

## 2022-12-21 MED ORDER — OXYCODONE-ACETAMINOPHEN 5-325 MG PO TABS
1.0000 | ORAL_TABLET | Freq: Once | ORAL | Status: AC
  Administered 2022-12-21: 1 via ORAL
  Filled 2022-12-21: qty 1

## 2022-12-21 MED ORDER — MORPHINE SULFATE (PF) 4 MG/ML IV SOLN
4.0000 mg | Freq: Once | INTRAVENOUS | Status: AC
  Administered 2022-12-21: 4 mg via INTRAVENOUS
  Filled 2022-12-21: qty 1

## 2022-12-21 MED ORDER — METRONIDAZOLE 500 MG PO TABS: 500.0000 mg | ORAL_TABLET | Freq: Two times a day (BID) | ORAL | 0 refills | Status: DC

## 2022-12-21 MED ORDER — ONDANSETRON 4 MG PO TBDP
4.0000 mg | ORAL_TABLET | Freq: Three times a day (TID) | ORAL | 0 refills | Status: DC | PRN
  Filled 2022-12-21: qty 20, 7d supply, fill #0

## 2022-12-21 MED ORDER — ONDANSETRON 4 MG PO TBDP
4.0000 mg | ORAL_TABLET | Freq: Once | ORAL | Status: AC
  Administered 2022-12-21: 4 mg via ORAL
  Filled 2022-12-21: qty 1

## 2022-12-21 MED ORDER — DOXYCYCLINE HYCLATE 100 MG PO CAPS: 100.0000 mg | ORAL_CAPSULE | Freq: Two times a day (BID) | ORAL | 0 refills | Status: DC

## 2022-12-21 MED ORDER — ONDANSETRON HCL 4 MG PO TABS: 4.0000 mg | ORAL_TABLET | Freq: Four times a day (QID) | ORAL | 0 refills | Status: DC

## 2022-12-21 MED ORDER — OXYCODONE-ACETAMINOPHEN 5-325 MG PO TABS
1.0000 | ORAL_TABLET | Freq: Four times a day (QID) | ORAL | 0 refills | Status: AC | PRN
  Filled 2022-12-21: qty 15, 4d supply, fill #0

## 2022-12-21 MED ORDER — HYDROCODONE-ACETAMINOPHEN 5-325 MG PO TABS
1.0000 | ORAL_TABLET | Freq: Once | ORAL | Status: AC
  Administered 2022-12-21: 1 via ORAL
  Filled 2022-12-21: qty 1

## 2022-12-21 MED ORDER — HYDROCODONE-ACETAMINOPHEN 5-325 MG PO TABS
1.0000 | ORAL_TABLET | Freq: Once | ORAL | Status: DC
  Filled 2022-12-21: qty 1
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
## 2022-12-21 NOTE — ED Notes (Signed)
Pt aware of the need for a urine... Unable to currently provide... 

## 2022-12-21 NOTE — ED Provider Notes (Signed)
[place] EMERGENCY DEPARTMENT AT [facility] Provider Note   CSN: [id number] Arrival date & time: 12/21/22  1013     History Chief Complaint  Patient presents with   Pelvic Pain    Chloe Rodgers is a 32 y.o. female.  Patient presents to the emergency department complaints of lower abdominal pain and flank pain.  Reports has been present for the last 4 days.  She reports that she was seen in the emergency department yesterday for similar concerns and prescribed Flagyl and doxycycline for concerns of BV and STIs.  Has prior history of bacterial vaginal infections as well as pelvic inflammatory disease.  Had C-section and tubal ligation with bilateral salpingectomy performed on 05/03/2022.   Pelvic Pain       Home Medications Prior to Admission medications   Medication Sig Start Date End Date Taking? Authorizing Provider  ondansetron (ZOFRAN-ODT) 4 MG disintegrating tablet Take 1 tablet (4 mg total) by mouth every 8 (eight) hours as needed for nausea or vomiting. 12/21/22  Yes Smitty Knudsen, PA-C  oxyCODONE-acetaminophen (PERCOCET/ROXICET) 5-325 MG tablet Take 1 tablet by mouth every 6 (six) hours as needed for severe pain. 12/21/22  Yes Smitty Knudsen, PA-C  buprenorphine-naloxone (SUBOXONE) 8-2 mg SUBL SL tablet Place 1 tablet under the tongue 3 (three) times daily. 05/06/22   Arabella Merles, CNM  doxycycline (VIBRAMYCIN) 100 MG capsule Take 1 capsule (100 mg total) by mouth 2 (two) times daily. 12/21/22   Horton, Clabe Seal, DO  gabapentin (NEURONTIN) 300 MG capsule Take 1 capsule (300 mg total) by mouth 3 (three) times daily. 05/06/22 06/05/22  Arabella Merles, CNM  metroNIDAZOLE (FLAGYL) 500 MG tablet Take 1 tablet (500 mg total) by mouth 2 (two) times daily. 12/21/22   Horton, Clabe Seal, DO  nitrofurantoin, macrocrystal-monohydrate, (MACROBID) 100 MG capsule Take 1 capsule (100 mg total) by mouth 2 (two) times daily. 06/15/22   Venora Maples, MD  ondansetron  (ZOFRAN) 4 MG tablet Take 1 tablet (4 mg total) by mouth every 6 (six) hours. 12/21/22   Horton, Clabe Seal, DO  polyethylene glycol powder (GLYCOLAX/MIRALAX) 17 GM/SCOOP powder Take 17 g by mouth daily as needed. Patient not taking: Reported on 05/11/2022 05/06/22   Arabella Merles, CNM      Allergies    Naproxen and Toradol [ketorolac tromethamine]    Review of Systems   Review of Systems  Genitourinary:  Positive for pelvic pain.  All other systems reviewed and are negative.   Physical Exam Updated Vital Signs BP (!) 141/91 (BP Location: Right Wrist)   Pulse 78   Temp 98.3 F (36.8 C) (Oral)   Resp 16   LMP 12/15/2022   SpO2 100%  Physical Exam Vitals and nursing note reviewed. Exam conducted with a chaperone present.  Constitutional:      General: She is not in acute distress.    Appearance: She is well-developed.  HENT:     Head: Normocephalic and atraumatic.  Eyes:     Conjunctiva/sclera: Conjunctivae normal.  Cardiovascular:     Rate and Rhythm: Normal rate and regular rhythm.     Heart sounds: No murmur heard. Pulmonary:     Effort: Pulmonary effort is normal. No respiratory distress.     Breath sounds: Normal breath sounds.  Abdominal:     General: There is no distension.     Palpations: Abdomen is soft. There is no mass.     Tenderness: There is abdominal tenderness in the  right lower quadrant, suprapubic area and left lower quadrant.     Hernia: No hernia is present.     Comments: Lower abdominal tenderness. Flank/low back tenderness to palpation.  Genitourinary:    Vagina: Vaginal discharge present.     Cervix: Discharge and erythema present.     Adnexa:        Right: Tenderness present.        Left: Tenderness present.   Musculoskeletal:        General: No swelling.     Cervical back: Neck supple.  Skin:    General: Skin is warm and dry.     Capillary Refill: Capillary refill takes less than 2 seconds.  Neurological:     Mental Status: She is  alert.  Psychiatric:        Mood and Affect: Mood normal.     ED Results / Procedures / Treatments   Labs (all labs ordered are listed, but only abnormal results are displayed) Labs Reviewed  CBC WITH DIFFERENTIAL/PLATELET - Abnormal; Notable for the following components:      Result Value   Hemoglobin 11.0 (*)    HCT 34.5 (*)    MCV 79.9 (*)    MCH 25.5 (*)    All other components within normal limits  COMPREHENSIVE METABOLIC PANEL  URINALYSIS, ROUTINE W REFLEX MICROSCOPIC    EKG None  Radiology US PELVIC COMPLETE W TRANSVAGINAL AND TORSION R/O  Result Date: 12/21/2022 CLINICAL DATA:  149326 LLQ pain [id number] RLQ abdominal pain [id number] EXAM: TRANSABDOMINAL AND TRANSVAGINAL ULTRASOUND OF PELVIS DOPPLER ULTRASOUND OF OVARIES TECHNIQUE: Both transabdominal and transvaginal ultrasound examinations of the pelvis were performed. Transabdominal technique was performed for global imaging of the pelvis including uterus, ovaries, adnexal regions, and pelvic cul-de-sac. It was necessary to proceed with endovaginal exam following the transabdominal exam to visualize the ovaries. Color and duplex Doppler ultrasound was utilized to evaluate blood flow to the ovaries. COMPARISON:  07/14/2021 FINDINGS: Uterus Measurements: 10.4 x 4.3 x 7.0 cm = volume: 163 mL. No fibroids or other mass visualized. Endometrium Thickness: 6 mm.  No focal abnormality visualized. Right ovary Measurements: 2.5 x 1.8 x 1.7 cm = volume: 4.0 mL. Normal appearance/no adnexal mass. Left ovary Measurements: 2.7 x 2.1 x 1.9 cm = volume: 5.6 mL. Normal appearance/no adnexal mass. Pulsed Doppler evaluation of the left ovary demonstrates normal low-resistance arterial and venous waveforms. Blood flow was demonstrated within the right ovary by color Doppler. Reliable waveforms with pulsed Doppler were unable to be obtained secondary to position of the right ovary within the pelvis. Other findings No significant free fluid within the  pelvis. IMPRESSION: Negative pelvic ultrasound. Electronically Signed   By: Duanne Guess D.O.   On: 12/21/2022 13:18    Procedures Procedures   Medications Ordered in ED Medications  ondansetron (ZOFRAN-ODT) disintegrating tablet 4 mg (4 mg Oral Given 12/21/22 1109)  oxyCODONE-acetaminophen (PERCOCET/ROXICET) 5-325 MG per tablet 1 tablet (1 tablet Oral Given 12/21/22 1109)  morphine (PF) 4 MG/ML injection 4 mg (4 mg Intravenous Given 12/21/22 1220)  morphine (PF) 4 MG/ML injection 4 mg (4 mg Intravenous Given 12/21/22 1344)    ED Course/ Medical Decision Making/ A&P                           Medical Decision Making Amount and/or Complexity of Data Reviewed Labs: ordered. Radiology: ordered.  Risk Prescription drug management.   This patient presents to the ED  for concern of pelvic pain.  Differential diagnosis includes pelvic inflammatory disease, STI, BV, ovarian torsion    Lab Tests:  I Ordered, and personally interpreted labs.  The pertinent results include: CBC, CMP, urinalysis   Imaging Studies ordered:  I ordered imaging studies including pelvic ultrasound I independently visualized and interpreted imaging which showed no evidence of torsion I agree with the radiologist interpretation   Medicines ordered and prescription drug management:  I ordered medication including Percocet, Zofran for pain, nausea Reevaluation of the patient after these medicines showed that the patient improved I have reviewed the patients home medicines and have made adjustments as needed   Problem List / ED Course:  Patient presents emergency department complaints of pelvic pain.  She was seen in the emergency department just yesterday for similar concerns and diagnosed with BV and started on Flagyl.  States that the pain has been worsening and has been present consistently over the last 4 days.  Given worsening symptoms, will reevaluate patient with basic labs and also order ultrasound  of pelvis for torsion rule out. Administer dose of Percocet without significant improvement in symptoms.  Patient's nausea was controlled with Zofran.  Will attempt to rate control pain with morphine.  Single dose morphine did not improve patient's pain will redosed with second dose morphine. Pelvic exam performed with chaperone present in the room significant for cervical motion tenderness and notable vaginal discharge.  Concern for BV/PID.  Patient currently being treated for both these conditions. Plan on discharging patient home with pain medication but was contacted by pharmacist that patient had recently picked up a quantity 90 of Percocet in late May (12/07/2022) and should have multiple weeks of medication available.  At high concern the patient is either abusing medication or diverting medication.  Advised pharmacist to hold off on refilling this medication send encourage patient to follow-up with OB/GYN as otherwise have been prescribed this medication for patient.  Zofran sent to patient's pharmacy for significant nausea.  Patient agreeable with treatment plan and verbalized understanding all return precautions.  Final Clinical Impression(s) / ED Diagnoses Final diagnoses:  PID (acute pelvic inflammatory disease)    Rx / DC Orders ED Discharge Orders          Ordered    oxyCODONE-acetaminophen (PERCOCET/ROXICET) 5-325 MG tablet  Every 6 hours PRN        12/21/22 1335    ondansetron (ZOFRAN-ODT) 4 MG disintegrating tablet  Every 8 hours PRN        12/21/22 1416              Smitty Knudsen, PA-C 12/22/22 2342    Melene Plan, DO 12/23/22 [phone]

## 2022-12-21 NOTE — Discharge Instructions (Signed)
You were seen in the ED for concerns of pelvic pain. Your labs, imaging, and urine test were negative today, but a wet prep and GC/chlamydia were not repeated as these were checked yesterday. Please follow up with your OBGYN for further evaluation. If your symptoms worsen, return to the ER.

## 2022-12-21 NOTE — ED Notes (Signed)
RN reviewed discharge instructions with pt. Pt verbalized understanding and had no further questions. VSS upon discharge.  

## 2022-12-21 NOTE — ED Triage Notes (Signed)
Pt arrives to ED with c/o continued pelvic pain fort over four days. She notes she was dx with BV at [facility] yesterday and started on flagyl. She reports this morning she vomited.

## 2022-12-21 NOTE — ED Notes (Signed)
Pt requested pain meds... Provider aware.Marland KitchenMarland Kitchen

## 2022-12-21 NOTE — ED Notes (Signed)
Report given to the next RN... 

## 2022-12-28 ENCOUNTER — Encounter: Payer: Medicaid Other | Admitting: Physical Therapy

## 2022-12-28 ENCOUNTER — Encounter: Payer: Self-pay | Admitting: Physical Therapy

## 2022-12-28 DIAGNOSIS — R102 Pelvic and perineal pain: Secondary | ICD-10-CM

## 2022-12-28 DIAGNOSIS — R252 Cramp and spasm: Secondary | ICD-10-CM
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
## 2023-01-02 ENCOUNTER — Other Ambulatory Visit: Payer: Self-pay | Admitting: Pediatrics

## 2023-01-02 NOTE — Telephone Encounter (Signed)
NAS postpartum follow up attempted without answer.  HIPAA compliant message left requesting a return call.  Will follow again on 7/19.

## 2023-01-03 ENCOUNTER — Encounter (INDEPENDENT_AMBULATORY_CARE_PROVIDER_SITE_OTHER): Payer: Medicaid Other | Admitting: Internal Medicine

## 2023-01-20 ENCOUNTER — Encounter: Payer: MEDICAID | Attending: Nurse Practitioner | Admitting: Physical Therapy

## 2023-01-20 ENCOUNTER — Encounter: Payer: Self-pay | Admitting: Physical Therapy

## 2023-01-20 DIAGNOSIS — R102 Pelvic and perineal pain: Secondary | ICD-10-CM | POA: Diagnosis present

## 2023-01-20 DIAGNOSIS — R252 Cramp and spasm: Secondary | ICD-10-CM
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
## 2023-01-25 ENCOUNTER — Telehealth (INDEPENDENT_AMBULATORY_CARE_PROVIDER_SITE_OTHER): Payer: Medicaid Other | Admitting: Family Medicine

## 2023-01-25 DIAGNOSIS — Z6841 Body Mass Index (BMI) 40.0 and over, adult: Secondary | ICD-10-CM

## 2023-01-25 DIAGNOSIS — E7849 Other hyperlipidemia: Secondary | ICD-10-CM
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
## 2023-01-26 ENCOUNTER — Telehealth (INDEPENDENT_AMBULATORY_CARE_PROVIDER_SITE_OTHER): Payer: MEDICAID | Admitting: Family Medicine

## 2023-01-26 ENCOUNTER — Encounter (INDEPENDENT_AMBULATORY_CARE_PROVIDER_SITE_OTHER): Payer: Self-pay | Admitting: Family Medicine

## 2023-01-26 DIAGNOSIS — Z7985 Long-term (current) use of injectable non-insulin antidiabetic drugs: Secondary | ICD-10-CM

## 2023-01-26 DIAGNOSIS — Z6841 Body Mass Index (BMI) 40.0 and over, adult: Secondary | ICD-10-CM

## 2023-01-26 DIAGNOSIS — E7849 Other hyperlipidemia: Secondary | ICD-10-CM | POA: Insufficient documentation

## 2023-01-26 DIAGNOSIS — E669 Obesity, unspecified: Secondary | ICD-10-CM | POA: Diagnosis not present

## 2023-01-26 DIAGNOSIS — E785 Hyperlipidemia, unspecified: Secondary | ICD-10-CM

## 2023-01-26 DIAGNOSIS — E119 Type 2 diabetes mellitus without complications: Secondary | ICD-10-CM | POA: Insufficient documentation

## 2023-01-26 DIAGNOSIS — E1169 Type 2 diabetes mellitus with other specified complication: Secondary | ICD-10-CM

## 2023-01-27 ENCOUNTER — Telehealth: Payer: Self-pay | Admitting: Pediatrics

## 2023-01-27 NOTE — Telephone Encounter (Signed)
NAS postpartum consult complete.  Mom at feeding appt with baby who continues to lose weight and refuse feedings.  GI consult initiated from PCP.  Will follow with mom this weekend when she is free.

## 2023-02-10 ENCOUNTER — Encounter: Payer: Self-pay | Admitting: Physical Therapy

## 2023-02-10 ENCOUNTER — Encounter: Payer: MEDICAID | Attending: Nurse Practitioner | Admitting: Physical Therapy

## 2023-02-10 DIAGNOSIS — R252 Cramp and spasm: Secondary | ICD-10-CM | POA: Insufficient documentation

## 2023-02-10 DIAGNOSIS — R102 Pelvic and perineal pain: Secondary | ICD-10-CM | POA: Diagnosis present
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
## 2023-04-01 ENCOUNTER — Telehealth: Payer: Self-pay | Admitting: Pediatrics

## 2023-04-01 NOTE — Telephone Encounter (Signed)
NAS postpartum telephone consult complete.  Mom updated on Ksyeem.  He is still slow to grow, continues to follow with SLP and new GI referral for GER management.  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  Mom states she has Stage III cervical cancer and needs to begin chemo.  Asked how we can support her and she stated just talking with [place] helps her mood.  Encouraged her to reach out to [facility] consult number by text or phone as needed.  She is hoping to stop by [facility] clinic for [place] to see Ksyeem when it opens.  Will follow again on 10/18.

## 2023-04-26 ENCOUNTER — Encounter: Payer: MEDICAID | Admitting: Physical Therapy

## 2023-05-03 ENCOUNTER — Encounter: Payer: MEDICAID | Attending: Nurse Practitioner | Admitting: Physical Therapy

## 2023-05-03 ENCOUNTER — Encounter: Payer: Self-pay | Admitting: Physical Therapy

## 2023-05-03 DIAGNOSIS — R252 Cramp and spasm: Secondary | ICD-10-CM | POA: Insufficient documentation

## 2023-05-03 DIAGNOSIS — M5459 Other low back pain: Secondary | ICD-10-CM | POA: Diagnosis present

## 2023-05-03 DIAGNOSIS — R102 Pelvic and perineal pain: Secondary | ICD-10-CM | POA: Insufficient documentation
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
## 2023-05-05 ENCOUNTER — Telehealth: Payer: Self-pay | Admitting: Pediatrics

## 2023-05-05 NOTE — Telephone Encounter (Signed)
NAS postpartum follow up call.  Mom reports Chloe Rodgers continues to see multiple subspecialties but is doing well.  Turned 1 yesterday and began walking.  Mom in the area and panning to stop by [facility] this afternoon.

## 2023-05-10 ENCOUNTER — Encounter: Payer: MEDICAID | Admitting: Physical Therapy

## 2023-06-22 ENCOUNTER — Encounter (HOSPITAL_BASED_OUTPATIENT_CLINIC_OR_DEPARTMENT_OTHER): Payer: Self-pay

## 2023-06-22 ENCOUNTER — Other Ambulatory Visit (HOSPITAL_BASED_OUTPATIENT_CLINIC_OR_DEPARTMENT_OTHER): Payer: Self-pay

## 2023-06-22 MED ORDER — WEGOVY 0.25 MG/0.5ML ~~LOC~~ SOAJ
0.2500 mg | SUBCUTANEOUS | 1 refills | Status: AC
  Filled 2023-06-22 – 2023-07-28 (×2): qty 2, 28d supply, fill #0

## 2023-06-27 ENCOUNTER — Other Ambulatory Visit (HOSPITAL_BASED_OUTPATIENT_CLINIC_OR_DEPARTMENT_OTHER): Payer: Self-pay

## 2023-07-07 ENCOUNTER — Other Ambulatory Visit (HOSPITAL_BASED_OUTPATIENT_CLINIC_OR_DEPARTMENT_OTHER): Payer: Self-pay

## 2023-07-08 ENCOUNTER — Other Ambulatory Visit (HOSPITAL_BASED_OUTPATIENT_CLINIC_OR_DEPARTMENT_OTHER): Payer: Self-pay

## 2023-07-28 ENCOUNTER — Other Ambulatory Visit (HOSPITAL_BASED_OUTPATIENT_CLINIC_OR_DEPARTMENT_OTHER): Payer: Self-pay

## 2023-07-29 ENCOUNTER — Other Ambulatory Visit (HOSPITAL_BASED_OUTPATIENT_CLINIC_OR_DEPARTMENT_OTHER): Payer: Self-pay

## 2023-09-15 ENCOUNTER — Other Ambulatory Visit (HOSPITAL_BASED_OUTPATIENT_CLINIC_OR_DEPARTMENT_OTHER): Payer: Self-pay

## 2023-09-15 MED ORDER — WEGOVY 0.5 MG/0.5ML ~~LOC~~ SOAJ
0.5000 mg | SUBCUTANEOUS | 2 refills | Status: AC
  Filled 2023-09-15 (×2): qty 2, 28d supply, fill #0

## 2023-09-26 ENCOUNTER — Other Ambulatory Visit (HOSPITAL_BASED_OUTPATIENT_CLINIC_OR_DEPARTMENT_OTHER): Payer: Self-pay
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
## 2024-01-24 ENCOUNTER — Encounter: Payer: MEDICAID | Admitting: Physical Therapy

## 2024-01-28 ENCOUNTER — Ambulatory Visit (HOSPITAL_COMMUNITY)
Admission: EM | Admit: 2024-01-28 | Discharge: 2024-01-28 | Disposition: A | Discharge status: Home / Self Care | Payer: MEDICAID | Attending: Neurology | Admitting: Neurology

## 2024-01-28 ENCOUNTER — Encounter (HOSPITAL_COMMUNITY): Payer: Self-pay | Admitting: *Deleted

## 2024-01-28 DIAGNOSIS — S00551A Superficial foreign body of lip, initial encounter: Secondary | ICD-10-CM

## 2024-01-28 DIAGNOSIS — R220 Localized swelling, mass and lump, head: Secondary | ICD-10-CM | POA: Diagnosis not present

## 2024-01-28 MED ORDER — PREDNISONE 20 MG PO TABS: 40.0000 mg | ORAL_TABLET | Freq: Every day | ORAL | 0 refills | Status: AC

## 2024-01-28 MED ORDER — METHYLPREDNISOLONE SODIUM SUCC 125 MG IJ SOLR
60.0000 mg | Freq: Once | INTRAMUSCULAR | Status: AC
  Administered 2024-01-28: 60 mg via INTRAMUSCULAR

## 2024-01-28 MED ORDER — METHYLPREDNISOLONE SODIUM SUCC 125 MG IJ SOLR
INTRAMUSCULAR | Status: AC
  Filled 2024-01-28: qty 2

## 2024-01-28 NOTE — Discharge Instructions (Addendum)
 You are presenting urgent care today with a lip ring stuck in your lip.  We have given you a steroid injection and recommend taking a steroid burst over the next 5 days starting tomorrow.  As the swelling goes down the goal is for you to be able to remove the lip ring yourself.  If you are unable to do this we recommend you follow-up here or in the emergency department over the next couple of days.

## 2024-01-28 NOTE — ED Triage Notes (Signed)
 Pt states she had her right upper lip pierced Wednesday and she has had swelling since. She states her lip ring is sunk inside her lip now.
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
## 2024-01-31 ENCOUNTER — Emergency Department (HOSPITAL_BASED_OUTPATIENT_CLINIC_OR_DEPARTMENT_OTHER)
Admission: EM | Admit: 2024-01-31 | Discharge: 2024-01-31 | Disposition: A | Discharge status: Home / Self Care | Payer: MEDICAID | Attending: Emergency Medicine | Admitting: Emergency Medicine

## 2024-01-31 ENCOUNTER — Encounter (HOSPITAL_BASED_OUTPATIENT_CLINIC_OR_DEPARTMENT_OTHER): Payer: Self-pay | Admitting: *Deleted

## 2024-01-31 ENCOUNTER — Encounter: Payer: Self-pay | Admitting: Physical Therapy

## 2024-01-31 ENCOUNTER — Other Ambulatory Visit: Payer: Self-pay

## 2024-01-31 ENCOUNTER — Encounter: Payer: MEDICAID | Admitting: Physical Therapy

## 2024-01-31 DIAGNOSIS — M5459 Other low back pain: Secondary | ICD-10-CM | POA: Insufficient documentation

## 2024-01-31 DIAGNOSIS — R102 Pelvic and perineal pain: Secondary | ICD-10-CM | POA: Diagnosis present

## 2024-01-31 DIAGNOSIS — T148XXA Other injury of unspecified body region, initial encounter: Secondary | ICD-10-CM

## 2024-01-31 DIAGNOSIS — S00551A Superficial foreign body of lip, initial encounter: Secondary | ICD-10-CM | POA: Insufficient documentation

## 2024-01-31 DIAGNOSIS — R252 Cramp and spasm: Secondary | ICD-10-CM | POA: Diagnosis present

## 2024-01-31 DIAGNOSIS — W4904XA Ring or other jewelry causing external constriction, initial encounter: Secondary | ICD-10-CM | POA: Insufficient documentation

## 2024-01-31 MED ORDER — LIDOCAINE HCL 2 % IJ SOLN
20.0000 mL | Freq: Once | INTRAMUSCULAR | Status: AC
  Administered 2024-01-31: 400 mg via INTRADERMAL
  Filled 2024-01-31: qty 20

## 2024-01-31 NOTE — ED Notes (Signed)
 Lidocaine at bedside.

## 2024-01-31 NOTE — ED Triage Notes (Signed)
 Patient to ED reporting new upper lip piercing x 1 week. Swelling worsened over the past three days and patient can not get piercing out. RN unable to see piercing at this time due to swelling around the ring

## 2024-01-31 NOTE — Discharge Instructions (Signed)
 Please keep the wound clean.  Continue the antibiotics until completed.

## 2024-01-31 NOTE — ED Provider Notes (Signed)
 [place] EMERGENCY DEPARTMENT AT [facility] Provider Note   CSN: [id number] Arrival date & time: 01/31/24  1223     Patient presents with: Oral Swelling   Chloe Rodgers is a 33 y.o. female.   Patient presents to the emergency department for evaluation of upper lip swelling.  Patient had a new piercing placed about a week ago.  This has become lodged in her upper lip.  She has had swelling and some purulent drainage as well.  Patient was seen at urgent care 3 days ago, given a steroid shot.  Symptoms have not improved.       Prior to Admission medications   Medication Sig Start Date End Date Taking? Authorizing Provider  buprenorphine -naloxone  (SUBOXONE ) 8-2 mg SUBL SL tablet Place 1 tablet under the tongue 3 (three) times daily. 05/06/22   Loreli Suzen BIRCH, CNM  doxycycline  (VIBRAMYCIN ) 100 MG capsule Take 1 capsule (100 mg total) by mouth 2 (two) times daily. 12/21/22   Horton, Roxie HERO, DO  gabapentin  (NEURONTIN ) 300 MG capsule Take 1 capsule (300 mg total) by mouth 3 (three) times daily. 05/06/22 06/05/22  Loreli Suzen D, CNM  metroNIDAZOLE  (FLAGYL ) 500 MG tablet Take 1 tablet (500 mg total) by mouth 2 (two) times daily. 12/21/22   Horton, Roxie HERO, DO  nitrofurantoin , macrocrystal-monohydrate, (MACROBID ) 100 MG capsule Take 1 capsule (100 mg total) by mouth 2 (two) times daily. 06/15/22   Lola Donnice HERO, MD  ondansetron  (ZOFRAN ) 4 MG tablet Take 1 tablet (4 mg total) by mouth every 6 (six) hours. 12/21/22   Horton, Roxie HERO, DO  ondansetron  (ZOFRAN -ODT) 4 MG disintegrating tablet Take 1 tablet (4 mg total) by mouth every 8 (eight) hours as needed for nausea or vomiting. 12/21/22   Zelaya, Oscar A, PA-C  oxyCODONE -acetaminophen  (PERCOCET/ROXICET) 5-325 MG tablet Take 1 tablet by mouth every 6 (six) hours as needed for severe pain. 12/21/22   Zelaya, Oscar A, PA-C  polyethylene glycol powder (GLYCOLAX /MIRALAX ) 17 GM/SCOOP powder Take 17 g by mouth daily as  needed. Patient not taking: Reported on 05/11/2022 05/06/22   Loreli Suzen BIRCH, CNM  predniSONE  (DELTASONE ) 20 MG tablet Take 2 tablets (40 mg total) by mouth daily with breakfast for 5 days. 01/28/24 02/02/24  Remi Pippin, NP  Semaglutide -Weight Management (WEGOVY ) 0.25 MG/0.5ML SOAJ Inject 0.25 mg into the skin once a week. 06/22/23     Semaglutide -Weight Management (WEGOVY ) 0.5 MG/0.5ML SOAJ Inject 0.5 mg into the skin once a week. 09/15/23       Allergies: Naproxen  and Toradol  [ketorolac  tromethamine ]    Review of Systems  Updated Vital Signs BP (!) 132/93 (BP Location: Right Arm)   Pulse 85   Temp 97.8 F (36.6 C) (Temporal)   Resp 14   LMP 01/22/2024 (Approximate)   SpO2 100%   Physical Exam Vitals and nursing note reviewed.  Constitutional:      Appearance: She is well-developed.  HENT:     Head: Normocephalic and atraumatic.     Mouth/Throat:     Comments: Right-sided upper lip swelling, foreign body palpable. Eyes:     Conjunctiva/sclera: Conjunctivae normal.  Pulmonary:     Effort: No respiratory distress.  Musculoskeletal:     Cervical back: Normal range of motion and neck supple.  Skin:    General: Skin is warm and dry.  Neurological:     Mental Status: She is alert.     (all labs ordered are listed, but only abnormal results are displayed) Labs Reviewed -  No data to display  EKG: None  Radiology: No results found.   .Foreign Body Removal  Date/Time: 01/31/2024 2:44 PM  Performed by: Desiderio Chew, PA-C Authorized by: Desiderio Chew, PA-C  Consent: Verbal consent obtained Consent given by: patient Patient identity confirmed: verbally with patient Intake: right upper lip. Anesthesia: nerve block  Anesthesia: Local Anesthetic: lidocaine  2% without epinephrine Anesthetic total: 3 mL  Sedation: Patient sedated: no  Complexity: simple 1 objects recovered. Objects recovered: stud Post-procedure assessment: foreign body removed Patient  tolerance: patient tolerated the procedure well with no immediate complications Comments: Once adequate anesthesia was achieved, attempt to push the backing of the stud through the inside of the lip.  This puncture already closed partially and required a small incision with an 11 blade.  I was able to push the back of the stud through the lip and clamp with hemostat.  Patient was able to remove the ball of the stud and then the backing was delivered through the inner lip.  Minor bleeding, controlled with pressure.  Patient tolerated well.     Medications Ordered in the ED  lidocaine  (XYLOCAINE ) 2 % (with pres) injection 400 mg (has no administration in time range)   ED Course  Patient seen and examined. History obtained directly from patient.   Labs/EKG: None ordered.   Imaging: None ordered.   Medications/Fluids: lidocaine   Most recent vital signs reviewed and are as follows: BP (!) 132/93 (BP Location: Right Arm)   Pulse 85   Temp 97.8 F (36.6 C) (Temporal)   Resp 14   LMP 01/22/2024 (Approximate)   SpO2 100%   Initial impression: Retained foreign body right upper lip  3:01 PM Reassessment performed. Patient appears stable.  Foreign body removed as per procedure note.  Reviewed pertinent lab work and imaging with patient at bedside. Questions answered.   Most current vital signs reviewed and are as follows: BP (!) 132/93 (BP Location: Right Arm)   Pulse 85   Temp 97.8 F (36.6 C) (Temporal)   Resp 14   LMP 01/22/2024 (Approximate)   SpO2 100%   Plan: Discharge to home.   Prescriptions written for: None  Other home care instructions discussed: Complete antibiotics prescribed urgent care  ED return instructions discussed: New or worsening symptoms occluding worsening swelling  Follow-up instructions discussed: Patient encouraged to follow-up with their PCP as needed.                                  Medical Decision Making Risk Prescription drug  management.   Patient with foreign body in lip, suspect mild associated infection.  Foreign body removed.     Final diagnoses:  Foreign body in skin    ED Discharge Orders     None          Desiderio Chew, PA-C 01/31/24 1502    Geraldene Hamilton, MD 02/01/24 [phone]

## 2024-01-31 NOTE — ED Notes (Incomplete)
 Reviewed AVS/discharge instruction with patient. Time allotted for and all questions answered. Patient is agreeable for d/c and escorted to ed exit by staff.

## 2024-01-31 NOTE — ED Notes (Signed)
 Provider at bedside

## 2024-02-07 ENCOUNTER — Telehealth: Payer: Self-pay | Admitting: Physical Therapy

## 2024-02-07 ENCOUNTER — Encounter: Payer: MEDICAID | Admitting: Physical Therapy

## 2024-02-07 NOTE — Telephone Encounter (Signed)
 Called patient and left a message about her missing her 11:30 appointment today.  Channing Pereyra, PT @7 /29/25@ 11:56 AM

## 2024-02-14 ENCOUNTER — Encounter: Payer: MEDICAID | Admitting: Physical Therapy

## 2024-04-05 ENCOUNTER — Telehealth: Payer: Self-pay

## 2024-04-05 NOTE — Telephone Encounter (Signed)
 I informed Chloe Rodgers that if she would like to be seen at our campus to have her PCP send the referral to us  so that she can continue treatment.

## 2024-05-06 ENCOUNTER — Other Ambulatory Visit: Payer: Self-pay

## 2024-05-06 ENCOUNTER — Emergency Department (HOSPITAL_BASED_OUTPATIENT_CLINIC_OR_DEPARTMENT_OTHER): Payer: MEDICAID

## 2024-05-06 ENCOUNTER — Emergency Department (HOSPITAL_BASED_OUTPATIENT_CLINIC_OR_DEPARTMENT_OTHER)
Admission: EM | Admit: 2024-05-06 | Discharge: 2024-05-06 | Disposition: A | Discharge status: Home / Self Care | Payer: MEDICAID | Attending: Emergency Medicine | Admitting: Emergency Medicine

## 2024-05-06 ENCOUNTER — Encounter (HOSPITAL_BASED_OUTPATIENT_CLINIC_OR_DEPARTMENT_OTHER): Payer: Self-pay

## 2024-05-06 DIAGNOSIS — N132 Hydronephrosis with renal and ureteral calculous obstruction: Secondary | ICD-10-CM | POA: Diagnosis not present

## 2024-05-06 DIAGNOSIS — R10A2 Flank pain, left side: Secondary | ICD-10-CM | POA: Diagnosis present

## 2024-05-06 DIAGNOSIS — N201 Calculus of ureter: Secondary | ICD-10-CM

## 2024-05-06 LAB — BASIC METABOLIC PANEL WITH GFR
Anion gap: 10 (ref 5–15)
BUN: 13 mg/dL (ref 6–20)
CO2: 27 mmol/L (ref 22–32)
Calcium: 9.4 mg/dL (ref 8.9–10.3)
Chloride: 100 mmol/L (ref 98–111)
Creatinine, Ser: 0.93 mg/dL (ref 0.44–1.00)
GFR, Estimated: >60 mL/min (ref >60)
Glucose, Bld: 132 mg/dL — ABNORMAL HIGH (ref 70–99)
Potassium: 3.8 mmol/L (ref 3.5–5.1)
Sodium: 137 mmol/L (ref 135–145)

## 2024-05-06 LAB — CBC
HCT: 39.5 % (ref 36.0–46.0)
Hemoglobin: 12.3 g/dL (ref 12.0–15.0)
MCH: 25.3 pg — ABNORMAL LOW (ref 26.0–34.0)
MCHC: 31.1 g/dL (ref 30.0–36.0)
MCV: 81.3 fL (ref 80.0–100.0)
Platelets: 273 K/uL (ref 150–400)
RBC: 4.86 MIL/uL (ref 3.87–5.11)
RDW: 16.7 % — ABNORMAL HIGH (ref 11.5–15.5)
WBC: 8.7 K/uL (ref 4.0–10.5)
nRBC: 0 % (ref 0.0–0.2)

## 2024-05-06 LAB — URINALYSIS, ROUTINE W REFLEX MICROSCOPIC
Bacteria, UA: NONE SEEN
Bilirubin Urine: NEGATIVE
Glucose, UA: NEGATIVE mg/dL
Ketones, ur: NEGATIVE mg/dL
Nitrite: NEGATIVE
Protein, ur: NEGATIVE mg/dL
RBC / HPF: >50 RBC/hpf (ref 0–5)
Specific Gravity, Urine: 1.022 (ref 1.005–1.030)
pH: 6.5 (ref 5.0–8.0)

## 2024-05-06 LAB — HCG, SERUM, QUALITATIVE: Preg, Serum: NEGATIVE

## 2024-05-06 MED ORDER — FENTANYL CITRATE (PF) 50 MCG/ML IJ SOSY
50.0000 ug | PREFILLED_SYRINGE | Freq: Once | INTRAMUSCULAR | Status: AC
  Administered 2024-05-06: 50 ug via INTRAVENOUS
  Filled 2024-05-06: qty 1

## 2024-05-06 MED ORDER — MORPHINE SULFATE (PF) 4 MG/ML IV SOLN
4.0000 mg | Freq: Once | INTRAVENOUS | Status: AC
  Administered 2024-05-06: 4 mg via INTRAVENOUS
  Filled 2024-05-06: qty 1

## 2024-05-06 MED ORDER — TAMSULOSIN HCL 0.4 MG PO CAPS: 0.4000 mg | ORAL_CAPSULE | Freq: Every day | ORAL | 0 refills | Status: AC

## 2024-05-06 MED ORDER — ONDANSETRON HCL 4 MG/2ML IJ SOLN
4.0000 mg | Freq: Once | INTRAMUSCULAR | Status: AC
  Administered 2024-05-06: 4 mg via INTRAVENOUS
  Filled 2024-05-06: qty 2

## 2024-05-06 MED ORDER — KETOROLAC TROMETHAMINE 30 MG/ML IJ SOLN
30.0000 mg | Freq: Once | INTRAMUSCULAR | Status: AC
  Administered 2024-05-06: 30 mg via INTRAVENOUS
  Filled 2024-05-06: qty 1

## 2024-05-06 MED ORDER — ONDANSETRON 4 MG PO TBDP: 4.0000 mg | ORAL_TABLET | Freq: Three times a day (TID) | ORAL | 0 refills | Status: AC | PRN

## 2024-05-06 NOTE — ED Triage Notes (Signed)
 Pt to exam 13 c/o Left sided flank pain 10/10 ache in nature x 1 hour. Pt state she has Hx Kidney stones and this feels same. PT denies dysuria. VSS NAD PT on room air.

## 2024-05-06 NOTE — ED Provider Notes (Signed)
 [place] EMERGENCY DEPARTMENT AT [facility] [address]  Provider Note  CSN: [id number] Arrival date & time: 05/06/24 0401  History Chief Complaint  Patient presents with   Flank Pain    Chloe Rodgers is a 33 y.o. female here with sudden onset L flank pain about a hour prior to arrival, similar to previous renal stones. No dysuria or fever.    Home Medications Prior to Admission medications   Medication Sig Start Date End Date Taking? Authorizing Provider  tamsulosin  (FLOMAX ) 0.4 MG CAPS capsule Take 1 capsule (0.4 mg total) by mouth daily. 05/06/24  Yes Roselyn Carlin NOVAK, MD  ondansetron  (ZOFRAN -ODT) 4 MG disintegrating tablet Take 1 tablet (4 mg total) by mouth every 8 (eight) hours as needed for nausea or vomiting. 05/06/24   Roselyn Carlin NOVAK, MD  oxyCODONE -acetaminophen  (PERCOCET/ROXICET) 5-325 MG tablet Take 1 tablet by mouth every 6 (six) hours as needed for severe pain. 12/21/22   Zelaya, Oscar A, PA-C  Semaglutide -Weight Management (WEGOVY ) 0.25 MG/0.5ML SOAJ Inject 0.25 mg into the skin once a week. 06/22/23     Semaglutide -Weight Management (WEGOVY ) 0.5 MG/0.5ML SOAJ Inject 0.5 mg into the skin once a week. 09/15/23        Allergies    Naproxen  and Toradol  [ketorolac  tromethamine ]   Review of Systems   Review of Systems Please see HPI for pertinent positives and negatives  Physical Exam BP (!) 140/109 (BP Location: Left Arm)   Pulse 100   Temp 97.7 F (36.5 C) (Oral)   Resp 17   Ht 5' 6 (1.676 m)   Wt 116.6 kg   LMP 04/29/2024 (Exact Date)   SpO2 100%   BMI 41.49 kg/m   Physical Exam Vitals and nursing note reviewed.  Constitutional:      Appearance: Normal appearance.  HENT:     Head: Normocephalic and atraumatic.     Nose: Nose normal.     Mouth/Throat:     Mouth: Mucous membranes are moist.  Eyes:     Extraocular Movements: Extraocular movements intact.     Conjunctiva/sclera: Conjunctivae normal.  Cardiovascular:     Rate and Rhythm:  Normal rate.  Pulmonary:     Effort: Pulmonary effort is normal.     Breath sounds: Normal breath sounds.  Abdominal:     General: Abdomen is flat.     Palpations: Abdomen is soft.     Tenderness: There is no abdominal tenderness.  Musculoskeletal:        General: No swelling. Normal range of motion.     Cervical back: Neck supple.  Skin:    General: Skin is warm and dry.  Neurological:     General: No focal deficit present.     Mental Status: She is alert.  Psychiatric:        Mood and Affect: Mood normal.     ED Results / Procedures / Treatments   EKG None  Procedures Procedures  Medications Ordered in the ED Medications  morphine  (PF) 4 MG/ML injection 4 mg (4 mg Intravenous Given 05/06/24 0428)  ondansetron  (ZOFRAN ) injection 4 mg (4 mg Intravenous Given 05/06/24 0426)  fentaNYL  (SUBLIMAZE ) injection 50 mcg (50 mcg Intravenous Given 05/06/24 0445)  ketorolac  (TORADOL ) 30 MG/ML injection 30 mg (30 mg Intravenous Given 05/06/24 0553)    Initial Impression and Plan  Patient here with L flank pain, consistent with renal colic. Will check labs, CT. Pain/nausea meds for comfort.   ED Course   Clinical Course as of 05/06/24  9360  Sun May 06, 2024  0442 CBC is unremarkable.  [CS]  0450 BMP is normal.  [CS]  0547 I personally viewed the images from radiology studies and agree with radiologist interpretation: CT with small distal ureteral stone. Will give a dose of toradol , prior intolerance low risk. Awaiting UA.  [CS]  [id number] Patient feeling better. Pain is controlled. Awaiting UA to ensure no infection, otherwise anticipate discharge with Rx for flomax , zofran  for nausea. She has oxycodone  at home for chronic pain. Urine strainer provided. Urology follow up, PCP follow up, RTED for any other concerns.   [CS]    Clinical Course User Index [CS] Roselyn Carlin NOVAK, MD     MDM Rules/Calculators/A&P Medical Decision Making Problems Addressed: Left ureteral stone: acute  illness or injury  Amount and/or Complexity of Data Reviewed Labs: ordered. Decision-making details documented in ED Course. Radiology: ordered and independent interpretation performed. Decision-making details documented in ED Course.  Risk Prescription drug management. Parenteral controlled substances.     Final Clinical Impression(s) / ED Diagnoses Final diagnoses:  Left ureteral stone    Rx / DC Orders ED Discharge Orders          Ordered    ondansetron  (ZOFRAN -ODT) 4 MG disintegrating tablet  Every 8 hours PRN        05/06/24 0637    tamsulosin  (FLOMAX ) 0.4 MG CAPS capsule  Daily        05/06/24 0637             Roselyn Carlin NOVAK, MD 05/06/24 [phone]

## 2024-05-06 NOTE — Discharge Instructions (Addendum)
 Please continue taking your oxycodone  at home as needed for pain. Flomax  (tamulosin) may help you pass the stone easier. Strain your urine until you pass the stone. Follow up with Urology if you have not passed the stone in a few days.
